# Patient Record
Sex: Female | Born: 1950 | Race: Black or African American | Hispanic: No | Marital: Married | Smoking: Former smoker
Health system: Southern US, Community
[De-identification: ages and names within clinical notes are randomized; demographics above are authoritative.]

## PROBLEM LIST (undated history)

## (undated) DIAGNOSIS — R7303 Prediabetes: Secondary | ICD-10-CM

## (undated) DIAGNOSIS — E785 Hyperlipidemia, unspecified: Secondary | ICD-10-CM

## (undated) DIAGNOSIS — Z8669 Personal history of other diseases of the nervous system and sense organs: Secondary | ICD-10-CM

## (undated) DIAGNOSIS — I071 Rheumatic tricuspid insufficiency: Secondary | ICD-10-CM

## (undated) DIAGNOSIS — M2550 Pain in unspecified joint: Secondary | ICD-10-CM

## (undated) DIAGNOSIS — M2540 Effusion, unspecified joint: Secondary | ICD-10-CM

## (undated) DIAGNOSIS — Z86718 Personal history of other venous thrombosis and embolism: Secondary | ICD-10-CM

## (undated) DIAGNOSIS — K5790 Diverticulosis of intestine, part unspecified, without perforation or abscess without bleeding: Secondary | ICD-10-CM

## (undated) DIAGNOSIS — Z8619 Personal history of other infectious and parasitic diseases: Secondary | ICD-10-CM

## (undated) DIAGNOSIS — G500 Trigeminal neuralgia: Secondary | ICD-10-CM

## (undated) DIAGNOSIS — R011 Cardiac murmur, unspecified: Secondary | ICD-10-CM

## (undated) DIAGNOSIS — G4700 Insomnia, unspecified: Secondary | ICD-10-CM

## (undated) DIAGNOSIS — H269 Unspecified cataract: Secondary | ICD-10-CM

## (undated) DIAGNOSIS — R51 Headache: Secondary | ICD-10-CM

## (undated) DIAGNOSIS — Z889 Allergy status to unspecified drugs, medicaments and biological substances status: Secondary | ICD-10-CM

## (undated) DIAGNOSIS — I10 Essential (primary) hypertension: Secondary | ICD-10-CM

## (undated) DIAGNOSIS — F419 Anxiety disorder, unspecified: Secondary | ICD-10-CM

## (undated) DIAGNOSIS — M1990 Unspecified osteoarthritis, unspecified site: Secondary | ICD-10-CM

## (undated) DIAGNOSIS — Z87898 Personal history of other specified conditions: Secondary | ICD-10-CM

## (undated) HISTORY — DX: Rheumatic tricuspid insufficiency: I07.1

## (undated) HISTORY — PX: ABDOMINAL HYSTERECTOMY: SHX81

## (undated) HISTORY — DX: Unspecified cataract: H26.9

## (undated) HISTORY — PX: COLONOSCOPY: SHX174

## (undated) HISTORY — PX: ESOPHAGOGASTRODUODENOSCOPY: SHX1529

## (undated) HISTORY — PX: JOINT REPLACEMENT: SHX530

## (undated) HISTORY — PX: REDUCTION MAMMAPLASTY: SUR839

## (undated) HISTORY — PX: EYE SURGERY: SHX253

## (undated) HISTORY — PX: CHOLECYSTECTOMY: SHX55

## (undated) HISTORY — DX: Trigeminal neuralgia: G50.0

---

## 1985-10-22 HISTORY — PX: BREAST REDUCTION SURGERY: SHX8

## 1986-10-22 HISTORY — PX: REDUCTION MAMMAPLASTY: SUR839

## 1998-05-17 ENCOUNTER — Ambulatory Visit (HOSPITAL_COMMUNITY): Admission: RE | Admit: 1998-05-17 | Discharge: 1998-05-17 | Payer: Self-pay | Admitting: General Surgery

## 1999-08-14 ENCOUNTER — Ambulatory Visit (HOSPITAL_COMMUNITY): Admission: RE | Admit: 1999-08-14 | Discharge: 1999-08-14 | Payer: Self-pay | Admitting: General Surgery

## 1999-08-14 ENCOUNTER — Encounter (HOSPITAL_BASED_OUTPATIENT_CLINIC_OR_DEPARTMENT_OTHER): Payer: Self-pay | Admitting: General Surgery

## 1999-12-11 ENCOUNTER — Emergency Department (HOSPITAL_COMMUNITY): Admission: EM | Admit: 1999-12-11 | Discharge: 1999-12-11 | Payer: Self-pay | Admitting: Emergency Medicine

## 2000-07-25 ENCOUNTER — Encounter (HOSPITAL_BASED_OUTPATIENT_CLINIC_OR_DEPARTMENT_OTHER): Payer: Self-pay | Admitting: General Surgery

## 2000-07-25 ENCOUNTER — Encounter: Admission: RE | Admit: 2000-07-25 | Discharge: 2000-07-25 | Payer: Self-pay | Admitting: General Surgery

## 2000-11-09 ENCOUNTER — Encounter: Payer: Self-pay | Admitting: Internal Medicine

## 2000-11-09 ENCOUNTER — Ambulatory Visit (HOSPITAL_COMMUNITY): Admission: RE | Admit: 2000-11-09 | Discharge: 2000-11-09 | Payer: Self-pay | Admitting: Internal Medicine

## 2001-01-28 ENCOUNTER — Emergency Department (HOSPITAL_COMMUNITY): Admission: EM | Admit: 2001-01-28 | Discharge: 2001-01-28 | Payer: Self-pay | Admitting: Emergency Medicine

## 2001-02-28 ENCOUNTER — Emergency Department (HOSPITAL_COMMUNITY): Admission: EM | Admit: 2001-02-28 | Discharge: 2001-02-28 | Payer: Self-pay | Admitting: Internal Medicine

## 2001-05-07 ENCOUNTER — Emergency Department (HOSPITAL_COMMUNITY): Admission: EM | Admit: 2001-05-07 | Discharge: 2001-05-07 | Payer: Self-pay | Admitting: Emergency Medicine

## 2001-05-07 ENCOUNTER — Encounter: Payer: Self-pay | Admitting: Emergency Medicine

## 2001-08-14 ENCOUNTER — Encounter (HOSPITAL_BASED_OUTPATIENT_CLINIC_OR_DEPARTMENT_OTHER): Payer: Self-pay | Admitting: General Surgery

## 2001-08-14 ENCOUNTER — Encounter: Admission: RE | Admit: 2001-08-14 | Discharge: 2001-08-14 | Payer: Self-pay | Admitting: General Surgery

## 2002-06-05 ENCOUNTER — Inpatient Hospital Stay (HOSPITAL_COMMUNITY): Admission: EM | Admit: 2002-06-05 | Discharge: 2002-06-08 | Payer: Self-pay | Admitting: Emergency Medicine

## 2002-06-05 ENCOUNTER — Encounter: Payer: Self-pay | Admitting: Emergency Medicine

## 2002-09-30 ENCOUNTER — Encounter (INDEPENDENT_AMBULATORY_CARE_PROVIDER_SITE_OTHER): Payer: Self-pay | Admitting: Specialist

## 2002-09-30 ENCOUNTER — Ambulatory Visit (HOSPITAL_COMMUNITY): Admission: RE | Admit: 2002-09-30 | Discharge: 2002-09-30 | Payer: Self-pay | Admitting: Gastroenterology

## 2002-10-02 ENCOUNTER — Encounter (HOSPITAL_BASED_OUTPATIENT_CLINIC_OR_DEPARTMENT_OTHER): Payer: Self-pay | Admitting: General Surgery

## 2002-10-02 ENCOUNTER — Encounter: Admission: RE | Admit: 2002-10-02 | Discharge: 2002-10-02 | Payer: Self-pay | Admitting: General Surgery

## 2002-11-01 ENCOUNTER — Encounter: Payer: Self-pay | Admitting: Emergency Medicine

## 2002-11-01 ENCOUNTER — Emergency Department (HOSPITAL_COMMUNITY): Admission: EM | Admit: 2002-11-01 | Discharge: 2002-11-01 | Payer: Self-pay | Admitting: Emergency Medicine

## 2002-11-06 ENCOUNTER — Emergency Department (HOSPITAL_COMMUNITY): Admission: EM | Admit: 2002-11-06 | Discharge: 2002-11-06 | Payer: Self-pay | Admitting: Emergency Medicine

## 2002-11-06 ENCOUNTER — Encounter: Payer: Self-pay | Admitting: Emergency Medicine

## 2003-06-15 ENCOUNTER — Emergency Department (HOSPITAL_COMMUNITY): Admission: EM | Admit: 2003-06-15 | Discharge: 2003-06-15 | Payer: Self-pay | Admitting: Emergency Medicine

## 2003-06-15 ENCOUNTER — Encounter: Payer: Self-pay | Admitting: Emergency Medicine

## 2003-06-21 ENCOUNTER — Encounter: Payer: Self-pay | Admitting: Emergency Medicine

## 2003-06-21 ENCOUNTER — Emergency Department (HOSPITAL_COMMUNITY): Admission: EM | Admit: 2003-06-21 | Discharge: 2003-06-21 | Payer: Self-pay | Admitting: Emergency Medicine

## 2003-06-25 ENCOUNTER — Encounter: Payer: Self-pay | Admitting: Gastroenterology

## 2003-06-25 ENCOUNTER — Ambulatory Visit (HOSPITAL_COMMUNITY): Admission: RE | Admit: 2003-06-25 | Discharge: 2003-06-25 | Payer: Self-pay | Admitting: Gastroenterology

## 2003-11-24 ENCOUNTER — Encounter: Admission: RE | Admit: 2003-11-24 | Discharge: 2003-11-24 | Payer: Self-pay | Admitting: Internal Medicine

## 2003-12-24 ENCOUNTER — Encounter: Admission: RE | Admit: 2003-12-24 | Discharge: 2003-12-24 | Payer: Self-pay | Admitting: General Surgery

## 2004-03-21 ENCOUNTER — Encounter: Admission: RE | Admit: 2004-03-21 | Discharge: 2004-03-21 | Payer: Self-pay | Admitting: Gastroenterology

## 2005-03-15 ENCOUNTER — Encounter: Admission: RE | Admit: 2005-03-15 | Discharge: 2005-03-15 | Payer: Self-pay | Admitting: Internal Medicine

## 2005-06-22 ENCOUNTER — Emergency Department (HOSPITAL_COMMUNITY): Admission: EM | Admit: 2005-06-22 | Discharge: 2005-06-22 | Payer: Self-pay | Admitting: Emergency Medicine

## 2005-10-28 ENCOUNTER — Emergency Department (HOSPITAL_COMMUNITY): Admission: EM | Admit: 2005-10-28 | Discharge: 2005-10-28 | Payer: Self-pay | Admitting: Emergency Medicine

## 2005-11-08 ENCOUNTER — Inpatient Hospital Stay (HOSPITAL_COMMUNITY): Admission: RE | Admit: 2005-11-08 | Discharge: 2005-11-12 | Payer: Self-pay | Admitting: Orthopedic Surgery

## 2006-04-17 ENCOUNTER — Encounter: Admission: RE | Admit: 2006-04-17 | Discharge: 2006-04-17 | Payer: Self-pay | Admitting: Internal Medicine

## 2006-04-22 ENCOUNTER — Emergency Department (HOSPITAL_COMMUNITY): Admission: EM | Admit: 2006-04-22 | Discharge: 2006-04-22 | Payer: Self-pay | Admitting: Emergency Medicine

## 2006-05-03 ENCOUNTER — Encounter: Admission: RE | Admit: 2006-05-03 | Discharge: 2006-05-03 | Payer: Self-pay | Admitting: Internal Medicine

## 2006-05-03 ENCOUNTER — Encounter (INDEPENDENT_AMBULATORY_CARE_PROVIDER_SITE_OTHER): Payer: Self-pay | Admitting: Specialist

## 2006-05-03 HISTORY — PX: BREAST BIOPSY: SHX20

## 2006-07-04 ENCOUNTER — Observation Stay (HOSPITAL_COMMUNITY): Admission: AD | Admit: 2006-07-04 | Discharge: 2006-07-07 | Payer: Self-pay | Admitting: Gastroenterology

## 2006-07-18 ENCOUNTER — Encounter: Admission: RE | Admit: 2006-07-18 | Discharge: 2006-07-18 | Payer: Self-pay | Admitting: Gastroenterology

## 2006-07-19 ENCOUNTER — Emergency Department (HOSPITAL_COMMUNITY): Admission: EM | Admit: 2006-07-19 | Discharge: 2006-07-19 | Payer: Self-pay | Admitting: Emergency Medicine

## 2006-07-25 ENCOUNTER — Ambulatory Visit: Payer: Self-pay | Admitting: Gastroenterology

## 2006-09-02 ENCOUNTER — Inpatient Hospital Stay (HOSPITAL_COMMUNITY): Admission: EM | Admit: 2006-09-02 | Discharge: 2006-09-05 | Payer: Self-pay | Admitting: Emergency Medicine

## 2006-12-03 ENCOUNTER — Encounter: Admission: RE | Admit: 2006-12-03 | Discharge: 2006-12-03 | Payer: Self-pay | Admitting: Internal Medicine

## 2007-04-28 ENCOUNTER — Encounter: Admission: RE | Admit: 2007-04-28 | Discharge: 2007-04-28 | Payer: Self-pay | Admitting: Internal Medicine

## 2007-05-22 ENCOUNTER — Emergency Department (HOSPITAL_COMMUNITY): Admission: EM | Admit: 2007-05-22 | Discharge: 2007-05-22 | Payer: Self-pay | Admitting: Emergency Medicine

## 2007-06-20 ENCOUNTER — Encounter: Admission: RE | Admit: 2007-06-20 | Discharge: 2007-06-20 | Payer: Self-pay | Admitting: Gastroenterology

## 2007-06-21 ENCOUNTER — Inpatient Hospital Stay (HOSPITAL_COMMUNITY): Admission: EM | Admit: 2007-06-21 | Discharge: 2007-06-23 | Payer: Self-pay | Admitting: Gastroenterology

## 2007-07-24 ENCOUNTER — Emergency Department (HOSPITAL_COMMUNITY): Admission: EM | Admit: 2007-07-24 | Discharge: 2007-07-24 | Payer: Self-pay | Admitting: *Deleted

## 2007-08-01 ENCOUNTER — Ambulatory Visit (HOSPITAL_COMMUNITY): Admission: RE | Admit: 2007-08-01 | Discharge: 2007-08-01 | Payer: Self-pay | Admitting: Gastroenterology

## 2007-10-20 ENCOUNTER — Emergency Department (HOSPITAL_COMMUNITY): Admission: EM | Admit: 2007-10-20 | Discharge: 2007-10-20 | Payer: Self-pay | Admitting: Emergency Medicine

## 2007-12-27 ENCOUNTER — Emergency Department (HOSPITAL_COMMUNITY): Admission: EM | Admit: 2007-12-27 | Discharge: 2007-12-27 | Payer: Self-pay | Admitting: Emergency Medicine

## 2008-05-13 ENCOUNTER — Encounter: Admission: RE | Admit: 2008-05-13 | Discharge: 2008-05-13 | Payer: Self-pay | Admitting: Internal Medicine

## 2008-08-12 ENCOUNTER — Other Ambulatory Visit: Payer: Self-pay | Admitting: Internal Medicine

## 2008-08-12 ENCOUNTER — Inpatient Hospital Stay (HOSPITAL_COMMUNITY): Admission: EM | Admit: 2008-08-12 | Discharge: 2008-08-14 | Payer: Self-pay | Admitting: Internal Medicine

## 2008-08-12 ENCOUNTER — Other Ambulatory Visit: Payer: Self-pay | Admitting: Emergency Medicine

## 2008-08-13 ENCOUNTER — Encounter (INDEPENDENT_AMBULATORY_CARE_PROVIDER_SITE_OTHER): Payer: Self-pay | Admitting: Internal Medicine

## 2008-08-13 ENCOUNTER — Ambulatory Visit: Payer: Self-pay | Admitting: Vascular Surgery

## 2008-08-24 ENCOUNTER — Observation Stay (HOSPITAL_COMMUNITY): Admission: EM | Admit: 2008-08-24 | Discharge: 2008-08-25 | Payer: Self-pay | Admitting: Emergency Medicine

## 2008-09-14 ENCOUNTER — Ambulatory Visit (HOSPITAL_BASED_OUTPATIENT_CLINIC_OR_DEPARTMENT_OTHER): Admission: RE | Admit: 2008-09-14 | Discharge: 2008-09-14 | Payer: Self-pay | Admitting: Family Medicine

## 2008-09-23 ENCOUNTER — Emergency Department (HOSPITAL_BASED_OUTPATIENT_CLINIC_OR_DEPARTMENT_OTHER): Admission: EM | Admit: 2008-09-23 | Discharge: 2008-09-23 | Payer: Self-pay | Admitting: Emergency Medicine

## 2008-09-23 ENCOUNTER — Ambulatory Visit (HOSPITAL_COMMUNITY): Admission: RE | Admit: 2008-09-23 | Discharge: 2008-09-23 | Payer: Self-pay | Admitting: Emergency Medicine

## 2008-10-22 DIAGNOSIS — Z87898 Personal history of other specified conditions: Secondary | ICD-10-CM

## 2008-10-22 HISTORY — DX: Personal history of other specified conditions: Z87.898

## 2008-10-27 ENCOUNTER — Ambulatory Visit: Payer: Self-pay | Admitting: Diagnostic Radiology

## 2008-10-27 ENCOUNTER — Encounter: Payer: Self-pay | Admitting: Emergency Medicine

## 2008-10-27 ENCOUNTER — Observation Stay (HOSPITAL_COMMUNITY): Admission: EM | Admit: 2008-10-27 | Discharge: 2008-10-30 | Payer: Self-pay | Admitting: Internal Medicine

## 2008-11-04 HISTORY — PX: NM MYOCAR PERF WALL MOTION: HXRAD629

## 2008-11-10 ENCOUNTER — Ambulatory Visit: Payer: Self-pay | Admitting: Oncology

## 2009-02-10 ENCOUNTER — Emergency Department (HOSPITAL_COMMUNITY): Admission: EM | Admit: 2009-02-10 | Discharge: 2009-02-11 | Payer: Self-pay | Admitting: Emergency Medicine

## 2009-02-16 ENCOUNTER — Inpatient Hospital Stay (HOSPITAL_COMMUNITY): Admission: RE | Admit: 2009-02-16 | Discharge: 2009-02-21 | Payer: Self-pay | Admitting: Orthopedic Surgery

## 2009-05-04 ENCOUNTER — Inpatient Hospital Stay (HOSPITAL_COMMUNITY): Admission: EM | Admit: 2009-05-04 | Discharge: 2009-05-10 | Payer: Self-pay | Admitting: Emergency Medicine

## 2009-05-27 ENCOUNTER — Encounter: Admission: RE | Admit: 2009-05-27 | Discharge: 2009-05-27 | Payer: Self-pay | Admitting: Internal Medicine

## 2009-07-25 ENCOUNTER — Emergency Department (HOSPITAL_BASED_OUTPATIENT_CLINIC_OR_DEPARTMENT_OTHER): Admission: EM | Admit: 2009-07-25 | Discharge: 2009-07-25 | Payer: Self-pay | Admitting: Emergency Medicine

## 2009-07-25 ENCOUNTER — Ambulatory Visit: Payer: Self-pay | Admitting: Diagnostic Radiology

## 2009-08-16 ENCOUNTER — Ambulatory Visit (HOSPITAL_COMMUNITY): Admission: RE | Admit: 2009-08-16 | Discharge: 2009-08-16 | Payer: Self-pay | Admitting: Orthopedic Surgery

## 2009-08-26 ENCOUNTER — Ambulatory Visit: Payer: Self-pay | Admitting: Obstetrics & Gynecology

## 2009-10-20 ENCOUNTER — Inpatient Hospital Stay (HOSPITAL_COMMUNITY): Admission: EM | Admit: 2009-10-20 | Discharge: 2009-10-22 | Payer: Self-pay | Admitting: Emergency Medicine

## 2010-03-18 ENCOUNTER — Ambulatory Visit: Payer: Self-pay | Admitting: Diagnostic Radiology

## 2010-03-18 ENCOUNTER — Emergency Department (HOSPITAL_BASED_OUTPATIENT_CLINIC_OR_DEPARTMENT_OTHER): Admission: EM | Admit: 2010-03-18 | Discharge: 2010-03-18 | Payer: Self-pay | Admitting: Emergency Medicine

## 2010-05-09 ENCOUNTER — Emergency Department (HOSPITAL_COMMUNITY): Admission: EM | Admit: 2010-05-09 | Discharge: 2010-05-09 | Payer: Self-pay | Admitting: Emergency Medicine

## 2010-06-07 ENCOUNTER — Encounter: Admission: RE | Admit: 2010-06-07 | Discharge: 2010-06-07 | Payer: Self-pay | Admitting: Internal Medicine

## 2010-07-26 ENCOUNTER — Encounter: Admission: RE | Admit: 2010-07-26 | Discharge: 2010-07-26 | Payer: Self-pay | Admitting: Orthopedic Surgery

## 2010-08-10 ENCOUNTER — Emergency Department (HOSPITAL_COMMUNITY): Admission: EM | Admit: 2010-08-10 | Discharge: 2010-08-10 | Payer: Self-pay | Admitting: Emergency Medicine

## 2010-10-26 ENCOUNTER — Emergency Department (HOSPITAL_COMMUNITY)
Admission: EM | Admit: 2010-10-26 | Discharge: 2010-10-26 | Payer: Self-pay | Source: Home / Self Care | Admitting: Emergency Medicine

## 2010-10-26 LAB — DIFFERENTIAL
Basophils Absolute: 0 10*3/uL (ref 0.0–0.1)
Basophils Relative: 1 % (ref 0–1)
Eosinophils Absolute: 0.1 10*3/uL (ref 0.0–0.7)
Eosinophils Relative: 1 % (ref 0–5)
Lymphocytes Relative: 40 % (ref 12–46)
Lymphs Abs: 2.1 10*3/uL (ref 0.7–4.0)
Monocytes Absolute: 0.5 10*3/uL (ref 0.1–1.0)
Monocytes Relative: 10 % (ref 3–12)
Neutro Abs: 2.5 10*3/uL (ref 1.7–7.7)
Neutrophils Relative %: 49 % (ref 43–77)

## 2010-10-26 LAB — BASIC METABOLIC PANEL
BUN: 16 mg/dL (ref 6–23)
CO2: 26 mEq/L (ref 19–32)
Calcium: 9.5 mg/dL (ref 8.4–10.5)
Chloride: 105 mEq/L (ref 96–112)
Creatinine, Ser: 0.76 mg/dL (ref 0.4–1.2)
GFR calc Af Amer: >60 mL/min (ref >60)
GFR calc non Af Amer: >60 mL/min (ref >60)
Glucose, Bld: 93 mg/dL (ref 70–99)
Potassium: 3.9 mEq/L (ref 3.5–5.1)
Sodium: 139 mEq/L (ref 135–145)

## 2010-10-26 LAB — URINALYSIS, ROUTINE W REFLEX MICROSCOPIC
Bilirubin Urine: NEGATIVE
Hemoglobin, Urine: NEGATIVE
Ketones, ur: NEGATIVE mg/dL
Nitrite: NEGATIVE
Protein, ur: NEGATIVE mg/dL
Specific Gravity, Urine: 1.022 (ref 1.005–1.030)
Urine Glucose, Fasting: NEGATIVE mg/dL
Urobilinogen, UA: 0.2 mg/dL (ref 0.0–1.0)
pH: 5.5 (ref 5.0–8.0)

## 2010-10-26 LAB — CBC
HCT: 42.3 % (ref 36.0–46.0)
Hemoglobin: 13.7 g/dL (ref 12.0–15.0)
MCH: 28.1 pg (ref 26.0–34.0)
MCHC: 32.4 g/dL (ref 30.0–36.0)
MCV: 86.7 fL (ref 78.0–100.0)
Platelets: 223 10*3/uL (ref 150–400)
RBC: 4.88 MIL/uL (ref 3.87–5.11)
RDW: 14.9 % (ref 11.5–15.5)
WBC: 5.2 10*3/uL (ref 4.0–10.5)

## 2010-10-26 LAB — POCT PREGNANCY, URINE: Preg Test, Ur: NEGATIVE

## 2010-10-26 LAB — PROTIME-INR
INR: 0.95 (ref 0.00–1.49)
Prothrombin Time: 12.9 seconds (ref 11.6–15.2)

## 2010-11-13 ENCOUNTER — Encounter: Payer: Self-pay | Admitting: Internal Medicine

## 2011-01-02 ENCOUNTER — Other Ambulatory Visit: Payer: Self-pay | Admitting: Internal Medicine

## 2011-01-02 DIAGNOSIS — N644 Mastodynia: Secondary | ICD-10-CM

## 2011-01-03 LAB — POCT CARDIAC MARKERS
CKMB, poc: <1 ng/mL — ABNORMAL LOW (ref 1.0–8.0)
Troponin i, poc: <0.05 ng/mL (ref 0.00–0.09)

## 2011-01-03 LAB — DIFFERENTIAL
Basophils Absolute: 0 10*3/uL (ref 0.0–0.1)
Eosinophils Relative: 1 % (ref 0–5)
Lymphocytes Relative: 40 % (ref 12–46)
Lymphs Abs: 2.7 10*3/uL (ref 0.7–4.0)
Neutro Abs: 3.5 10*3/uL (ref 1.7–7.7)
Neutrophils Relative %: 51 % (ref 43–77)

## 2011-01-03 LAB — URINE MICROSCOPIC-ADD ON

## 2011-01-03 LAB — D-DIMER, QUANTITATIVE: D-Dimer, Quant: 0.37 ug/mL-FEU (ref 0.00–0.48)

## 2011-01-03 LAB — URINALYSIS, ROUTINE W REFLEX MICROSCOPIC
Nitrite: NEGATIVE
Specific Gravity, Urine: 1.026 (ref 1.005–1.030)
Urobilinogen, UA: 0.2 mg/dL (ref 0.0–1.0)
pH: 5.5 (ref 5.0–8.0)

## 2011-01-03 LAB — BASIC METABOLIC PANEL
BUN: 14 mg/dL (ref 6–23)
Chloride: 103 mEq/L (ref 96–112)
Creatinine, Ser: 0.84 mg/dL (ref 0.4–1.2)
GFR calc Af Amer: >60 mL/min (ref >60)
GFR calc non Af Amer: >60 mL/min (ref >60)

## 2011-01-03 LAB — PROTIME-INR
INR: 0.97 (ref 0.00–1.49)
Prothrombin Time: 13.1 seconds (ref 11.6–15.2)

## 2011-01-03 LAB — CBC
MCV: 86.5 fL (ref 78.0–100.0)
Platelets: 243 10*3/uL (ref 150–400)
RBC: 4.46 MIL/uL (ref 3.87–5.11)
RDW: 15.1 % (ref 11.5–15.5)
WBC: 6.8 10*3/uL (ref 4.0–10.5)

## 2011-01-03 LAB — RAPID URINE DRUG SCREEN, HOSP PERFORMED
Benzodiazepines: POSITIVE — AB
Tetrahydrocannabinol: NOT DETECTED

## 2011-01-03 LAB — APTT: aPTT: 25 seconds (ref 24–37)

## 2011-01-03 LAB — ETHANOL: Alcohol, Ethyl (B): <5 mg/dL (ref 0–10)

## 2011-01-09 ENCOUNTER — Ambulatory Visit
Admission: RE | Admit: 2011-01-09 | Discharge: 2011-01-09 | Disposition: A | Discharge status: Home / Self Care | Source: Ambulatory Visit | Attending: Internal Medicine | Admitting: Internal Medicine

## 2011-01-09 DIAGNOSIS — N644 Mastodynia: Secondary | ICD-10-CM

## 2011-01-22 LAB — BASIC METABOLIC PANEL
BUN: 17 mg/dL (ref 6–23)
Chloride: 105 mEq/L (ref 96–112)
GFR calc non Af Amer: >60 mL/min (ref >60)
Glucose, Bld: 88 mg/dL (ref 70–99)
Potassium: 3.4 mEq/L — ABNORMAL LOW (ref 3.5–5.1)
Sodium: 139 mEq/L (ref 135–145)

## 2011-01-22 LAB — PROTIME-INR: Prothrombin Time: 13.2 seconds (ref 11.6–15.2)

## 2011-01-22 LAB — APTT: aPTT: 25 seconds (ref 24–37)

## 2011-01-22 LAB — CBC
HCT: 40.6 % (ref 36.0–46.0)
Hemoglobin: 13.4 g/dL (ref 12.0–15.0)
RBC: 4.61 MIL/uL (ref 3.87–5.11)
RDW: 15.7 % — ABNORMAL HIGH (ref 11.5–15.5)
WBC: 6.2 10*3/uL (ref 4.0–10.5)

## 2011-01-22 LAB — CK TOTAL AND CKMB (NOT AT ARMC)
CK, MB: 1 ng/mL (ref 0.3–4.0)
CK, MB: 1.3 ng/mL (ref 0.3–4.0)
Total CK: 61 U/L (ref 7–177)
Total CK: 82 U/L (ref 7–177)

## 2011-01-22 LAB — LIPID PANEL
Cholesterol: 148 mg/dL (ref 0–200)
LDL Cholesterol: 50 mg/dL (ref 0–99)

## 2011-01-22 LAB — TROPONIN I: Troponin I: 0.02 ng/mL (ref 0.00–0.06)

## 2011-01-25 LAB — URINE CULTURE

## 2011-01-25 LAB — URINALYSIS, ROUTINE W REFLEX MICROSCOPIC
Nitrite: NEGATIVE
Protein, ur: NEGATIVE mg/dL
Specific Gravity, Urine: 1.019 (ref 1.005–1.030)
Urobilinogen, UA: 0.2 mg/dL (ref 0.0–1.0)

## 2011-01-28 LAB — CBC
HCT: 31.7 % — ABNORMAL LOW (ref 36.0–46.0)
Hemoglobin: 10.2 g/dL — ABNORMAL LOW (ref 12.0–15.0)
MCHC: 32.2 g/dL (ref 30.0–36.0)
MCV: 85.6 fL (ref 78.0–100.0)
RDW: 16.2 % — ABNORMAL HIGH (ref 11.5–15.5)

## 2011-01-29 ENCOUNTER — Emergency Department (HOSPITAL_COMMUNITY)
Admission: EM | Admit: 2011-01-29 | Discharge: 2011-01-29 | Disposition: A | Discharge status: Home / Self Care | Attending: Emergency Medicine | Admitting: Emergency Medicine

## 2011-01-29 ENCOUNTER — Emergency Department (HOSPITAL_COMMUNITY)

## 2011-01-29 DIAGNOSIS — K5289 Other specified noninfective gastroenteritis and colitis: Secondary | ICD-10-CM | POA: Insufficient documentation

## 2011-01-29 DIAGNOSIS — K5730 Diverticulosis of large intestine without perforation or abscess without bleeding: Secondary | ICD-10-CM | POA: Insufficient documentation

## 2011-01-29 DIAGNOSIS — I10 Essential (primary) hypertension: Secondary | ICD-10-CM | POA: Insufficient documentation

## 2011-01-29 DIAGNOSIS — R109 Unspecified abdominal pain: Secondary | ICD-10-CM | POA: Insufficient documentation

## 2011-01-29 LAB — DIFFERENTIAL
Basophils Absolute: 0 10*3/uL (ref 0.0–0.1)
Basophils Relative: 0 % (ref 0–1)
Basophils Relative: 0 % (ref 0–1)
Basophils Relative: 0 % (ref 0–1)
Eosinophils Absolute: 0 10*3/uL (ref 0.0–0.7)
Eosinophils Absolute: 0.1 10*3/uL (ref 0.0–0.7)
Eosinophils Relative: 1 % (ref 0–5)
Lymphocytes Relative: 38 % (ref 12–46)
Lymphs Abs: 2.3 10*3/uL (ref 0.7–4.0)
Lymphs Abs: 2.5 10*3/uL (ref 0.7–4.0)
Lymphs Abs: 2.5 10*3/uL (ref 0.7–4.0)
Monocytes Absolute: 0.6 10*3/uL (ref 0.1–1.0)
Monocytes Relative: 6 % (ref 3–12)
Monocytes Relative: 7 % (ref 3–12)
Monocytes Relative: 8 % (ref 3–12)
Monocytes Relative: 9 % (ref 3–12)
Neutro Abs: 3.5 10*3/uL (ref 1.7–7.7)
Neutro Abs: 5.1 10*3/uL (ref 1.7–7.7)
Neutrophils Relative %: 53 % (ref 43–77)
Neutrophils Relative %: 63 % (ref 43–77)
Neutrophils Relative %: 70 % (ref 43–77)

## 2011-01-29 LAB — BASIC METABOLIC PANEL
Calcium: 8.7 mg/dL (ref 8.4–10.5)
Creatinine, Ser: 0.93 mg/dL (ref 0.4–1.2)
GFR calc Af Amer: >60 mL/min (ref >60)

## 2011-01-29 LAB — CBC
HCT: 41 % (ref 36.0–46.0)
Hemoglobin: 10.6 g/dL — ABNORMAL LOW (ref 12.0–15.0)
Hemoglobin: 13.2 g/dL (ref 12.0–15.0)
MCH: 28 pg (ref 26.0–34.0)
MCHC: 32.8 g/dL (ref 30.0–36.0)
MCHC: 32.8 g/dL (ref 30.0–36.0)
MCHC: 33.2 g/dL (ref 30.0–36.0)
MCV: 83.4 fL (ref 78.0–100.0)
Platelets: 209 10*3/uL (ref 150–400)
Platelets: 275 10*3/uL (ref 150–400)
RBC: 4.13 MIL/uL (ref 3.87–5.11)
RBC: 4.71 MIL/uL (ref 3.87–5.11)
RBC: 4.83 MIL/uL (ref 3.87–5.11)
RBC: 5.13 MIL/uL — ABNORMAL HIGH (ref 3.87–5.11)
RDW: 17.2 % — ABNORMAL HIGH (ref 11.5–15.5)
RDW: 17.6 % — ABNORMAL HIGH (ref 11.5–15.5)
WBC: 8.1 10*3/uL (ref 4.0–10.5)

## 2011-01-29 LAB — URINALYSIS, ROUTINE W REFLEX MICROSCOPIC
Bilirubin Urine: NEGATIVE
Glucose, UA: NEGATIVE mg/dL
Hgb urine dipstick: NEGATIVE
Specific Gravity, Urine: 1.026 (ref 1.005–1.030)
Urobilinogen, UA: 0.2 mg/dL (ref 0.0–1.0)
pH: 5.5 (ref 5.0–8.0)

## 2011-01-29 LAB — URINALYSIS, MICROSCOPIC ONLY
Glucose, UA: NEGATIVE mg/dL
Nitrite: NEGATIVE
Protein, ur: NEGATIVE mg/dL

## 2011-01-29 LAB — HEPATIC FUNCTION PANEL
Alkaline Phosphatase: 67 U/L (ref 39–117)
Indirect Bilirubin: 0.4 mg/dL (ref 0.3–0.9)
Total Bilirubin: 0.5 mg/dL (ref 0.3–1.2)

## 2011-01-29 LAB — URINE CULTURE
Culture: NO GROWTH
Special Requests: NEGATIVE

## 2011-01-29 LAB — COMPREHENSIVE METABOLIC PANEL
ALT: 10 U/L (ref 0–35)
ALT: 12 U/L (ref 0–35)
ALT: 16 U/L (ref 0–35)
AST: 20 U/L (ref 0–37)
AST: 23 U/L (ref 0–37)
Albumin: 2.8 g/dL — ABNORMAL LOW (ref 3.5–5.2)
Alkaline Phosphatase: 59 U/L (ref 39–117)
CO2: 26 mEq/L (ref 19–32)
CO2: 28 mEq/L (ref 19–32)
Calcium: 8.5 mg/dL (ref 8.4–10.5)
Calcium: 8.9 mg/dL (ref 8.4–10.5)
Chloride: 103 mEq/L (ref 96–112)
Creatinine, Ser: 0.83 mg/dL (ref 0.4–1.2)
GFR calc Af Amer: >60 mL/min (ref >60)
GFR calc Af Amer: >60 mL/min (ref >60)
GFR calc Af Amer: >60 mL/min (ref >60)
GFR calc non Af Amer: >60 mL/min (ref >60)
Glucose, Bld: 95 mg/dL (ref 70–99)
Potassium: 4 mEq/L (ref 3.5–5.1)
Sodium: 141 mEq/L (ref 135–145)
Sodium: 142 mEq/L (ref 135–145)
Total Bilirubin: 0.5 mg/dL (ref 0.3–1.2)
Total Protein: 5.3 g/dL — ABNORMAL LOW (ref 6.0–8.3)
Total Protein: 6.7 g/dL (ref 6.0–8.3)

## 2011-01-29 LAB — LIPASE, BLOOD
Lipase: 23 U/L (ref 11–59)
Lipase: 29 U/L (ref 11–59)

## 2011-01-29 LAB — CARDIAC PANEL(CRET KIN+CKTOT+MB+TROPI)
CK, MB: 0.7 ng/mL (ref 0.3–4.0)
Relative Index: INVALID (ref 0.0–2.5)
Total CK: 51 U/L (ref 7–177)
Troponin I: 0.01 ng/mL (ref 0.00–0.06)

## 2011-01-29 LAB — TSH: TSH: 3.679 u[IU]/mL (ref 0.350–4.500)

## 2011-01-29 MED ORDER — IOHEXOL 300 MG/ML  SOLN
100.0000 mL | Freq: Once | INTRAMUSCULAR | Status: AC | PRN
  Administered 2011-01-29: 100 mL via INTRAVENOUS

## 2011-01-30 LAB — BASIC METABOLIC PANEL
BUN: 5 mg/dL — ABNORMAL LOW (ref 6–23)
BUN: 6 mg/dL (ref 6–23)
CO2: 30 mEq/L (ref 19–32)
Calcium: 8.2 mg/dL — ABNORMAL LOW (ref 8.4–10.5)
Chloride: 102 mEq/L (ref 96–112)
Creatinine, Ser: 0.64 mg/dL (ref 0.4–1.2)
Creatinine, Ser: 0.7 mg/dL (ref 0.4–1.2)
GFR calc non Af Amer: >60 mL/min (ref >60)
Glucose, Bld: 109 mg/dL — ABNORMAL HIGH (ref 70–99)
Potassium: 3.8 mEq/L (ref 3.5–5.1)

## 2011-01-30 LAB — PROTIME-INR
INR: 1.5 (ref 0.00–1.49)
Prothrombin Time: 18.4 seconds — ABNORMAL HIGH (ref 11.6–15.2)
Prothrombin Time: 18.5 seconds — ABNORMAL HIGH (ref 11.6–15.2)

## 2011-01-30 LAB — CBC
MCHC: 33.2 g/dL (ref 30.0–36.0)
MCHC: 34.6 g/dL (ref 30.0–36.0)
MCV: 83.9 fL (ref 78.0–100.0)
Platelets: 172 10*3/uL (ref 150–400)
RBC: 2.9 MIL/uL — ABNORMAL LOW (ref 3.87–5.11)
RDW: 15.6 % — ABNORMAL HIGH (ref 11.5–15.5)
RDW: 16.4 % — ABNORMAL HIGH (ref 11.5–15.5)

## 2011-01-31 LAB — CBC
HCT: 38.6 % (ref 36.0–46.0)
Hemoglobin: 9.1 g/dL — ABNORMAL LOW (ref 12.0–15.0)
Hemoglobin: 12.6 g/dL (ref 12.0–15.0)
Platelets: 110 10*3/uL — ABNORMAL LOW (ref 150–400)
Platelets: 168 10*3/uL (ref 150–400)
RDW: 16.2 % — ABNORMAL HIGH (ref 11.5–15.5)
WBC: 11 10*3/uL — ABNORMAL HIGH (ref 4.0–10.5)
WBC: 11.8 10*3/uL — ABNORMAL HIGH (ref 4.0–10.5)
WBC: 6.4 10*3/uL (ref 4.0–10.5)

## 2011-01-31 LAB — TYPE AND SCREEN: Antibody Screen: NEGATIVE

## 2011-01-31 LAB — BASIC METABOLIC PANEL
BUN: 7 mg/dL (ref 6–23)
CO2: 27 mEq/L (ref 19–32)
Calcium: 8.6 mg/dL (ref 8.4–10.5)
Chloride: 107 mEq/L (ref 96–112)
Creatinine, Ser: 0.61 mg/dL (ref 0.4–1.2)
GFR calc non Af Amer: >60 mL/min (ref >60)
GFR calc non Af Amer: >60 mL/min (ref >60)
Glucose, Bld: 100 mg/dL — ABNORMAL HIGH (ref 70–99)
Potassium: 4.3 mEq/L (ref 3.5–5.1)
Potassium: 3.9 mEq/L (ref 3.5–5.1)
Sodium: 141 mEq/L (ref 135–145)

## 2011-01-31 LAB — URINALYSIS, ROUTINE W REFLEX MICROSCOPIC
Glucose, UA: NEGATIVE mg/dL
Hgb urine dipstick: NEGATIVE
Protein, ur: NEGATIVE mg/dL

## 2011-01-31 LAB — DIFFERENTIAL
Eosinophils Relative: 1 % (ref 0–5)
Lymphocytes Relative: 36 % (ref 12–46)
Lymphs Abs: 2.3 10*3/uL (ref 0.7–4.0)
Monocytes Absolute: 0.7 10*3/uL (ref 0.1–1.0)
Monocytes Relative: 11 % (ref 3–12)

## 2011-01-31 LAB — CK TOTAL AND CKMB (NOT AT ARMC)
CK, MB: 1.5 ng/mL (ref 0.3–4.0)
Relative Index: 0.9 (ref 0.0–2.5)
Total CK: 494 U/L — ABNORMAL HIGH (ref 7–177)

## 2011-01-31 LAB — POCT CARDIAC MARKERS
CKMB, poc: <1 ng/mL — ABNORMAL LOW (ref 1.0–8.0)
CKMB, poc: <1 ng/mL — ABNORMAL LOW (ref 1.0–8.0)
Myoglobin, poc: 42.5 ng/mL (ref 12–200)

## 2011-01-31 LAB — PROTIME-INR
INR: 1.1 (ref 0.00–1.49)
INR: 1.4 (ref 0.00–1.49)
Prothrombin Time: 13.1 seconds (ref 11.6–15.2)
Prothrombin Time: 14.8 seconds (ref 11.6–15.2)

## 2011-01-31 LAB — TROPONIN I
Troponin I: 0.06 ng/mL (ref 0.00–0.06)
Troponin I: <0.01 ng/mL (ref 0.00–0.06)

## 2011-01-31 LAB — ABO/RH: ABO/RH(D): O POS

## 2011-02-05 LAB — BASIC METABOLIC PANEL
BUN: 8 mg/dL (ref 6–23)
CO2: 25 mEq/L (ref 19–32)
Calcium: 9 mg/dL (ref 8.4–10.5)
GFR calc non Af Amer: >60 mL/min (ref >60)
GFR calc non Af Amer: >60 mL/min (ref >60)
Glucose, Bld: 97 mg/dL (ref 70–99)
Glucose, Bld: 117 mg/dL — ABNORMAL HIGH (ref 70–99)
Potassium: 3.9 mEq/L (ref 3.5–5.1)
Potassium: 4.3 mEq/L (ref 3.5–5.1)
Sodium: 140 mEq/L (ref 135–145)

## 2011-02-05 LAB — CBC
HCT: 40.9 % (ref 36.0–46.0)
HCT: 36.6 % (ref 36.0–46.0)
HCT: 36.9 % (ref 36.0–46.0)
Hemoglobin: 13.3 g/dL (ref 12.0–15.0)
MCHC: 32.9 g/dL (ref 30.0–36.0)
MCV: 86.1 fL (ref 78.0–100.0)
Platelets: 220 10*3/uL (ref 150–400)
Platelets: 225 10*3/uL (ref 150–400)
RDW: 14.2 % (ref 11.5–15.5)
RDW: 15.2 % (ref 11.5–15.5)
RDW: 15.4 % (ref 11.5–15.5)

## 2011-02-05 LAB — CARDIAC PANEL(CRET KIN+CKTOT+MB+TROPI)
CK, MB: 0.9 ng/mL (ref 0.3–4.0)
CK, MB: 1.1 ng/mL (ref 0.3–4.0)
Total CK: 100 U/L (ref 7–177)
Total CK: 84 U/L (ref 7–177)

## 2011-02-05 LAB — PROTIME-INR: Prothrombin Time: 20.8 seconds — ABNORMAL HIGH (ref 11.6–15.2)

## 2011-02-05 LAB — DIFFERENTIAL
Eosinophils Relative: 1 % (ref 0–5)
Lymphocytes Relative: 36 % (ref 12–46)
Lymphs Abs: 2.5 10*3/uL (ref 0.7–4.0)
Monocytes Absolute: 0.7 10*3/uL (ref 0.1–1.0)

## 2011-02-05 LAB — POCT CARDIAC MARKERS
CKMB, poc: 1 ng/mL (ref 1.0–8.0)
Myoglobin, poc: 32.4 ng/mL (ref 12–200)
Troponin i, poc: <0.05 ng/mL (ref 0.00–0.09)

## 2011-02-12 ENCOUNTER — Emergency Department (HOSPITAL_COMMUNITY)
Admission: EM | Admit: 2011-02-12 | Discharge: 2011-02-12 | Disposition: A | Discharge status: Home / Self Care | Attending: Emergency Medicine | Admitting: Emergency Medicine

## 2011-02-12 DIAGNOSIS — K2920 Alcoholic gastritis without bleeding: Secondary | ICD-10-CM | POA: Insufficient documentation

## 2011-02-12 DIAGNOSIS — E7800 Pure hypercholesterolemia, unspecified: Secondary | ICD-10-CM | POA: Insufficient documentation

## 2011-02-12 DIAGNOSIS — Z86711 Personal history of pulmonary embolism: Secondary | ICD-10-CM | POA: Insufficient documentation

## 2011-02-12 DIAGNOSIS — R1011 Right upper quadrant pain: Secondary | ICD-10-CM | POA: Insufficient documentation

## 2011-02-12 DIAGNOSIS — I10 Essential (primary) hypertension: Secondary | ICD-10-CM | POA: Insufficient documentation

## 2011-02-12 DIAGNOSIS — K219 Gastro-esophageal reflux disease without esophagitis: Secondary | ICD-10-CM | POA: Insufficient documentation

## 2011-02-12 LAB — COMPREHENSIVE METABOLIC PANEL
ALT: 28 U/L (ref 0–35)
AST: 32 U/L (ref 0–37)
Albumin: 4.3 g/dL (ref 3.5–5.2)
Chloride: 108 mEq/L (ref 96–112)
Creatinine, Ser: 0.83 mg/dL (ref 0.4–1.2)
GFR calc Af Amer: >60 mL/min (ref >60)
Potassium: 3.5 mEq/L (ref 3.5–5.1)
Sodium: 139 mEq/L (ref 135–145)
Total Bilirubin: 0.3 mg/dL (ref 0.3–1.2)

## 2011-02-12 LAB — URINALYSIS, ROUTINE W REFLEX MICROSCOPIC
Glucose, UA: NEGATIVE mg/dL
Leukocytes, UA: NEGATIVE
Nitrite: NEGATIVE
Specific Gravity, Urine: 1.025 (ref 1.005–1.030)
pH: 5 (ref 5.0–8.0)

## 2011-02-12 LAB — DIFFERENTIAL
Basophils Absolute: 0 10*3/uL (ref 0.0–0.1)
Basophils Relative: 0 % (ref 0–1)
Eosinophils Absolute: 0 10*3/uL (ref 0.0–0.7)
Neutro Abs: 4.9 10*3/uL (ref 1.7–7.7)
Neutrophils Relative %: 65 % (ref 43–77)

## 2011-02-12 LAB — URINE MICROSCOPIC-ADD ON

## 2011-02-12 LAB — CBC
Hemoglobin: 14.4 g/dL (ref 12.0–15.0)
MCH: 28.5 pg (ref 26.0–34.0)
Platelets: 233 10*3/uL (ref 150–400)
RBC: 5.06 MIL/uL (ref 3.87–5.11)
WBC: 7.5 10*3/uL (ref 4.0–10.5)

## 2011-03-01 ENCOUNTER — Other Ambulatory Visit: Payer: Self-pay | Admitting: Gastroenterology

## 2011-03-01 DIAGNOSIS — R110 Nausea: Secondary | ICD-10-CM

## 2011-03-02 ENCOUNTER — Other Ambulatory Visit: Payer: Self-pay | Admitting: Gastroenterology

## 2011-03-02 ENCOUNTER — Ambulatory Visit
Admission: RE | Admit: 2011-03-02 | Discharge: 2011-03-02 | Disposition: A | Discharge status: Home / Self Care | Source: Ambulatory Visit | Attending: Gastroenterology | Admitting: Gastroenterology

## 2011-03-02 DIAGNOSIS — R110 Nausea: Secondary | ICD-10-CM

## 2011-03-06 NOTE — Discharge Summary (Signed)
[name], [name]               ACCOUNT NO.:  [id number]   MEDICAL RECORD NO.:  [id number]          PATIENT TYPE:  EMS   LOCATION:  ED                           FACILITY:  [facility]   PHYSICIAN:  Theodosia Paling, MD    DATE OF BIRTH:  June 02, 1951   DATE OF ADMISSION:  08/12/2008  DATE OF DISCHARGE:  08/14/2008                               DISCHARGE SUMMARY   PRIMARY CARE PHYSICIAN:  Dr. Dorothyann Peng.   ADMITTING HISTORY:  Please refer to the admission note dictated by Dr.  Buena Irish on August 12, 2008, under history of present illness.   HOSPITAL COURSE:  Following issues were addressed during the  hospitalization.  1. Pulmonary embolism.  Venous duplex is negative for clot in the      lower extremity.  The patient received Lovenox and Coumadin and was      instructed how to use.  She will continue this at home and will      follow up with the primary care physician, Dr. Allyne Gee on Monday      for her CBC and INR check.  2. The patient did not have any oxygen desaturation or hemodynamic      instability.  Tolerated anticoagulants fairly well.  3. Hyperlipidemia.  Crestor was continued.  4. Hypertension.  Diuretics were continued.   DISCHARGE DIAGNOSES:  1. Pulmonary embolism.  2. Hypertension.  3. Hyperlipidemia.   DISCHARGE MEDICATIONS:  1. Lovenox 110 mg subcu daily.  2. Coumadin 7.5 mg p.o. daily.   The patient is instructed to continue with home medications which  include;  1. Crestor 5 mg p.o. daily.  2. Maxzide, which is 37/25 mg p.o. daily.  3. Nexium 40 mg p.o. daily.   PROCEDURE PERFORMED:  None.   IMAGING PERFORMED:  None.   DISPOSITION:  The patient will follow up with Dr. Allyne Gee on Monday for  CBC and INR check and thereafter, she also needs work up for the cause  of her pulmonary embolism.   TOTAL TIME SPENT:  45 minutes.      Theodosia Paling, MD  Electronically Signed     NP/MEDQ  D:  08/13/2008  T:  08/14/2008  Job:  [id number]   cc:    Candyce Churn. Allyne Gee, M.D.

## 2011-03-06 NOTE — Consult Note (Signed)
NAMECLAUDEAN, [name]               ACCOUNT NO.:  [id number]   MEDICAL RECORD NO.:  [id number]          PATIENT TYPE:  EMS   LOCATION:  ED                           FACILITY:  [facility]   PHYSICIAN:  Loma Sender, MD         DATE OF BIRTH:  06-Jun-1951   DATE OF CONSULTATION:  02/11/2009  DATE OF DISCHARGE:  02/11/2009                                 CONSULTATION   REASON FOR CONSULTATION:  Chest pain.   PRIMARY CARE PHYSICIAN:  Dorothyann Peng, M.D.   HISTORY OF PRESENT ILLNESS:  Ms. [name] is a 60 year old female with  past medical history significant for pulmonary embolism on Coumadin  therapy, hypertension, dyslipidemia, and atypical chest pain with  numerous workups in the past with no evidence of cardiac involvement,  who presented to the emergency department with a 3 day history of chest  pain. The patient reports that her pain began in her left back on  Tuesday. She reported that she initially felt that the pain was  musculoskeletal in nature but when it failed to improve, she became more  concerned. On Wednesday and Thursday, the intensity of pain worsened and  it began to radiate around to her left chest, into her left breast bone.  She denied any association of the pain with activity. She reported that  her pain was constant in nature and was unrelenting. Nothing seemed to  make the pain worse but she did get some improvement with her Percocet  pain medication at home. She denied any association of shortness of  breath but might have experienced some palpitations. She denies any  syncope or presyncopal symptoms, diaphoresis, or nausea and vomiting.  She did comment that the pain seemed to be worse with her breathing. She  denies any recent strenuous activity. When her symptoms continued to  bother her, she came to the emergency department for evaluation. In the  emergency department, patient was found to have an INR of 2.5 with 3  sets of cardiac enzymes that were negative for  signs of ischemia. CTA  was performed of the chest but she had no evidence of pulmonary  embolism. EKG showed normal sinus rhythm with T-wave inversions in V1,  otherwise no signs of ST elevation or T-wave changes. With these  findings, Internal Medicine was consulted for further recommendations.   PAST MEDICAL HISTORY:  1. Pulmonary embolism.  2. Hypertension.  3. Gastroesophageal reflux disease.  4. Dyslipidemia.  5. Status post cholecystectomy.  6. Status post hip replacement.  7. Status post hysterectomy.  8. Status post breast reduction surgery.   ALLERGIES:  PENICILLIN, VICODIN, IBUPROFEN.   MEDICATIONS:  1. Crestor 5 mg daily.  2. Maxzide 1 tablet daily.  3. Claritin 10 mg daily.  4. Coumadin.  5. Nexium 40 mg daily.   SOCIAL HISTORY:  The patient lives in [place]. Has no tobacco,  alcohol, or IV drug use. She is a travel agent.   FAMILY HISTORY:  Negative for coronary artery disease, peripheral  vascular disease, or strokes.   REVIEW OF SYSTEMS:  Complete review of systems  was performed and is  otherwise negative unless noted in the history of present illness.   PHYSICAL EXAMINATION:  VITAL SIGNS:  Blood pressure 116/76, temperature  97.7, pulse 81, respiratory rate 20. Sating 99% on room air.  GENERAL:  This is an African-American female in no acute distress.  HEENT:  Normocephalic and atraumatic. Extraocular muscles intact. Pupils  are equal, round, and constricted. Nares are patent bilaterally.  Oropharynx is clear without erythema or exudate. Moist mucous membranes.  Hearing is grossly intact.  NECK:  Supple without lymphadenopathy, jugular venous distention, or  carotid bruits.  LUNGS:  Clear to auscultation bilaterally without wheezes or rhonchi.  CARDIOVASCULAR:  Regular rate and rhythm. Without murmur, rub, or  gallop. 2+ pulses at the wrist and ankle.  EXTREMITIES:  No lower extremity edema.  ABDOMEN:  Soft, nontender, and nondistended. Bowel sounds  are active.  NEUROLOGIC:  Cranial nerves 2-12 are intact. Muscle strength is  symmetric. Sensation is grossly normal.  SKIN:  No rashes or lesions noted.  MUSCULOSKELETAL:  No bony abnormalities, muscle tenderness, or active  synovitis.  NEUROLOGIC:  Alert and oriented times four. Memory is intact to recent  and distant events. Mood is appropriate. Affect is full.   LABORATORY DATA:  Troponin less than 0.05 x1 at 1:30 a.m. At 8:45 p.m.  troponin less than 0.01. At 11:30, troponin less than 0.05. Basic  metabolic profile  shows sodium of 141, potassium 2.9, chloride 107,  bicarb 27, glucose 100, BUN 18, creatinine 0.88, calcium 8.9. INR 2.5.  CBC shows a white count of 6.4, hemoglobin 12.6, platelet count 204,000.   RADIOLOGY:  CT of the chest:  No pulmonary emboli identified.   EKG shows normal sinus rhythm with T-wave inversions in V1, otherwise  this is a normal EKG with rate of 79 beats per minute. Review of  previous inpatient results, heart catheterization of September 03, 2006,  shows non-critical coronary artery disease.   ASSESSMENT/PLAN:  This is a 60 year old female who presents to the  emergency department with left sided back and chest pain of 3 days  duration with negative cardiac and pulmonary workup in the emergency  department.  1. LEFT BACK AND CHEST PAIN:  My initial impression is that this is      either musculoskeletal in nature versus psychosomatic chest pain. I      have spoken for 30 minutes with the patient about other possible      causes for her chest pain including both musculoskeletal and      psychosomatic etiologies. I think the likelihood of this being      cardiac chest pain is very low, considering recent heart      catheterization within the last 3 years with a reported normal      stress test within the last 6 months, and with appropriate      management of risk factors including hyperlipidemia. The chance of      this being cardiac chest pain is  almost non-existent. As far as      pulmonary source for the chest pain, the patient has had a history      of pulmonary emboli but is currently therapeutic on her Coumadin      and had CTA here that shows no PE. Therefore, without cardiac or      pulmonary causes for the chest pain, I feel that hospital admission      is unwarranted and serves no purpose at this time. I  have      recommended that the patient followup with her primary care      physician in the next 3 to 5 days and to discuss alternative      modalities for working up her chest pain, including a therapeutic      trial with SSRI anti-depressant, versus physical therapy assessment      for musculoskeletal causes of chest pain. The patient will be      provided with      prescription of Percocet to assist with her pain over the next      several days until she is able to followup with her primary care      Samiel Peel. The patient was instructed to return to the emergency      department with any other concerning symptoms.      Loma Sender, MD  Electronically Signed     TJ/MEDQ  D:  02/11/2009  T:  02/11/2009  Job:  [id number]   cc:   Izola Price. [place], [place]

## 2011-03-06 NOTE — Discharge Summary (Signed)
NAMEADAMARY, SAVARY               ACCOUNT NO.:  [id number]   MEDICAL RECORD NO.:  [id number]          PATIENT TYPE:  INP   LOCATION:  1312                         FACILITY:  [facility]   PHYSICIAN:  Marcellus Scott, MD     DATE OF BIRTH:  01-15-1951   DATE OF ADMISSION:  05/03/2009  DATE OF DISCHARGE:  05/10/2009                               DISCHARGE SUMMARY   PRIMARY MEDICAL DOCTOR:  Dr. Dorothyann Peng.   PRIMARY GASTROENTEROLOGIST:  Dr. Charna Elizabeth.   DISCHARGE DIAGNOSES:  1. Abdominal pain secondary to gastroparesis.  2. Gastroparesis, ? secondary to narcotic use.  3. Fatty liver.  4. Gastroesophageal reflux disease.  5. Anemia, for outpatient evaluation.  6. Hyperlipidemia.  7. Hypertension.  8. Tobacco abuse.  9. History of pulmonary embolism, has completed Coumadin therapy.  10.Status post left total hip arthroplasty on February 16, 2009.   DISCHARGE MEDICATIONS:  These are unchanged from those prior to  admission.  1. Maxzide 37.5/25mg , one p.o. daily.  2. Crestor 10 mg p.o. daily.  3. Nexium 40 mg p.o. daily.  4. Ambien 10 mg p.o. q.h.s. p.r.n.  5. Tylenol 650 mg p.o. every 4 to 6 hours as needed for pain.   PROCEDURES:  1. Gastric emptying scan on May 09, 2009.  Impression:  Borderline      delayed gastric emptying.  2. CT angiogram of the chest, May 06, 2009.  Impression:  No evidence      of pulmonary emboli, thoracic aortic aneurysm, or dissection.  Tiny      bilateral pleural effusions with mild bibasilar atelectasis.  3. MRI of the abdomen with and without contrast.  Impression:      a.     No acute abdominal process.      b.     The CT abnormality corresponds to focal fat deposition       within the pancreas.  No suspicious pancreatic lesion.      c.     Cholecystectomy without biliary ductal dilatation.  4. CT of the abdomen with contrast.  Impression:  Hypodensity uncinate      process of the pancreas, may be related to artifact although a mass      cannot  be excluded.  Elective MR of the abdomen with attenuation to      pancreas was recommended and done.  5. CT of the pelvis.  Impression:  No inflammation noted surrounding      the appendix.  Limited evaluation of the pelvis secondary to streak      artifact caused by hip replacement.  6. EGD by Dr. Virginia Rochester.  Findings:  Changes of gastroparesis with solid      food remaining in the stomach from at least over 24 hours      previously.   PERTINENT LABS:  Urine culture 15,000 colonies, multiple bacterial  morphocytes.  D-dimer 1.21.  Urine culture from May 05, 2009, no  growth.  CBC, May 06, 2009, hemoglobin 10.2, hematocrit 32, white blood  cells 6.4, platelets 192, MCV 86.  Urinalysis May 04, 2009, three to 6  white blood cells and many bacteria.  Comprehensive metabolic panel May 05, 2009, only remarkable for total protein 5.3, albumin 2.8.  BUN was  5, creatinine 0.76.  Cardiac panel was negative.  Lipase 23, amylase 65,  TSH 3.679.   CONSULTATIONS:  Gastroenterology, Dr. Elnoria Howard, Dr. Virginia Rochester, Dr. Loreta Ave.   HOSPITAL COURSE AND PATIENT DISPOSITION:  Ms. Weger is a pleasant 60-  year-old [place] American female patient with history of previous  pulmonary embolism who completed Coumadin therapy, hypertension,  hyperlipidemia, gastroesophageal reflux disease, status post hip  arthroplasty in April following which she was on Percocet for  approximately 2 months which she completed in June of 2010.  She now  presented with epigastric and right upper quadrant abdominal pain which  was fairly sudden in onset with associated nausea, vomiting.  She had no  sickly contacts and the only unusual food was eating some [place] pasta  salad.  She was admitted to the hospital for further evaluation and  management.  1. Abdominal pain.  Patient was admitted to the hospital.  She was      made nil per oral.  She had extensive evaluation done including CT      of the abdomen and pelvis, MRI of the abdomen which  failed to      reveal the cause.  Initially, she was thought to have acute      gastroenteritis or food poisoning.  However, once she was started      on clear liquid, she still continued to complain of abdominal pain.      GI was consulted.  They proceeded to do an upper endoscopy which      showed retained food of more than 24 hours.  They assessed her to      have gastroparesis and recommended discontinuing her narcotics.      Gastric emptying scan was performed which showed mild abnormality      and emptying.  Dr. Loreta Ave saw her yesterday and recommended avoiding      pain medications and  try probiotics.  She did not recommend any      prokinetics.  She recommended followup in her office in 2 weeks      after discharge.  Patient is tolerating oral diet.  She still has      mild pain which seems to be gradually slowly improving.  She has      had BMs.  She is hemodynamically stable and apart from minimal      tenderness in the right middle quadrant her abdomen is soft.      Patient at one point also complained of some pressure-like      sensation in the right lower chest.  Given her history of previous      pulmonary embolism, D-dimer was performed which was positive and      this was followed up by CT chest which was negative for pulmonary      embolism.  She did not have any dyspnea.  2. Anemia which seems to be of chronic disease but this needs to be      further evaluated as an outpatient.  If she has not had a recent      colonoscopy, one needs to be performed.  3  Hypertension.  Patient had not been on antihypertensives in the  hospital; however, her blood pressures are now in the 120s/90s and  155/82 range.  We will ask her to resume her home Maxzide.  1. Tobacco abuse.  Cessation counseling done and patient verbalizes      understanding.  Patient has declined a nicotine patch.   Patient at this time is stable for discharge home and to follow up with  her primary M.D..  She is  to also call Dr. Kenna Gilbert office for followup in  the next 2 weeks.   TIME TAKEN IN COORDINATING THIS DISCHARGE:  Less than 30 minutes.      Marcellus Scott, MD  Electronically Signed     AH/MEDQ  D:  05/10/2009  T:  05/10/2009  Job:  [id number]   cc:   Candyce Churn. Allyne Gee, M.D.  Fax: [phone]   JXBJYN WGN FAOZ, M.D.  Fax: [phone]

## 2011-03-06 NOTE — Consult Note (Signed)
**Luna Veronica via Obfuscation** [name], [name]               ACCOUNT NO.:  [id number]   MEDICAL RECORD NO.:  [id number]          PATIENT TYPE:  INP   LOCATION:  1312                         [facility]ACILITY[facility]  [facility]   PHYSICIAN:  Jordan Hawks. Elnoria Howard, MD    DATE OF BIRTH:  Sep 12, 1951   DATE OF CONSULTATION:  05/06/2009  DATE OF DISCHARGE:  05/10/2009                                 CONSULTATION   This is a GI consultation which was originally performed on May 06, 2009.   REASON FOR CONSULTATION:  Right upper quadrant abdominal pain.   HISTORY OF PRESENT ILLNESS:  This is a 60 year old female with a past  medical history of pulmonary embolism, hyperlipidemia, hypertension,  gastroesophageal reflux disease, status post cholecystectomy,  hysterectomy, and bilateral breast reduction is admitted to the hospital  with complaints of right upper quadrant pain.  The patient states that  the pain was acute in onset and it woke her up from sleep.  She denies  having any trauma to the area, no associated maneuvers to improve the  pain.  There is no worsening of the pain with movement.  She states that  it is quite severe and she is unable to find any relief from it.  As a  result of her symptoms, she also had associated nausea and vomiting.  In  the past, she has had issues with abdominal pain and in 2007 she was  admitted for abdominal pain secondary to colitis.  She responded to  Cipro, Flagyl at that time, and did not require any further evaluation.  During this hospitalization, she underwent further workup and there is  no evidence of any abnormalities on the CT scan, aside from a  questionable mass at the head of the pancreas, which turned out to be  fat-sparing on the MRI scan.  She also underwent a CT angio, which was  also negative for pulmonary embolism.  Because of the persistent pain, a  GI consultation was requested for further evaluation and treatment.   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   FAMILY HISTORY:  Noncontributory.   SOCIAL HISTORY:  Negative for illicit drug use, but she does smoke 5 to  6 cigarettes per week and some occasional alcohol, approximately 3 to 4  mixed drinks per week.   REVIEW OF SYSTEMS:  As stated above in the history of present illness.  Otherwise, negative.   ALLERGIES:  PENICILLIN, LATEX, IBUPROFEN, MORPHINE SULFATE.   PHYSICAL EXAMINATION:  VITAL SIGNS:  Stable.  GENERAL:  The patient is in no acute distress.  She is very  uncomfortable from the right upper quadrant pain.  HEENT:  Normocephalic, atraumatic.  Extraocular muscles intact.  NECK:  Supple.  No lymphadenopathy.  LUNGS:  Clear to auscultation bilaterally.  CARDIOVASCULAR:  Regular rate and rhythm.  ABDOMEN:  Obese, soft, tender in the right upper quadrant.  No evidence  of any costochondritis.  No rebound, rigidity.  Positive bowel sounds.  EXTREMITIES:  No cyanosis, clubbing, or edema.  LABORATORY VALUES:  White blood cell count 6.4, hemoglobin 10.2,  platelets 192,000.   IMPRESSION:  1. Right upper quadrant abdominal pain of unclear etiology.  2. Other multiple medical problems.  I am unable to define the cause      of her pain.  There is no evidence of any costochondritis with      palpation in that area, but it appears that her nausea and vomiting      is a problem secondary to the pain.  I feel that an endoscopic      evaluation is warranted, but I do not feel that the yield would be      significant.   PLAN:  For an EGD and then further recommendations depending on  findings.      Jordan Hawks Elnoria Howard, MD  Electronically Signed     PDH/MEDQ  D:  05/10/2009  T:  05/10/2009  Job:  [id number]   cc:   Candyce Churn. Allyne Gee, M.D.  Fax: [phone]

## 2011-03-06 NOTE — Op Note (Signed)
[name], [name]               ACCOUNT NO.:  [id number]   MEDICAL RECORD NO.:  [id number]          PATIENT TYPE:  INP   LOCATION:  1312                         FACILITY:  [facility]   PHYSICIAN:  Georgiana Spinner, M.D.    DATE OF BIRTH:  [birth year]   DATE OF PROCEDURE:  05/07/2009  DATE OF DISCHARGE:                               OPERATIVE REPORT   PROCEDURE:  Upper endoscopy.   INDICATIONS:  Abdominal pain, nausea and vomiting.   ANESTHESIA:  Fentanyl 100 mcg, Versed 10 mg.   PROCEDURE IN DETAIL:  With the patient mildly sedated in the left  lateral decubitus position the Pentax videoscopic endoscope was inserted  in the mouth and passed under direct vision through the esophagus which  appeared normal into the stomach and food solid material was seen in the  stomach, beans and other partially digested material.  We advanced  distally and fundus, body, antrum appeared normal.  Pylorus was patent  and easily passed the endoscope through this into the duodenal bulb and  second portion duodenum all of which appeared normal.  From this point  the endoscope was slowly withdrawn taking circumferential views of  duodenal mucosa until the endoscope had been pulled back into the  stomach and placed in retroflexion to view the stomach from below.  The  endoscope was then straightened and withdrawn taking circumferential  views of remaining gastric and esophageal mucosa.  The patient's vital  signs, pulse oximeter remained stable.  The patient tolerated procedure  well without apparent complication.   FINDINGS:  Changes of gastroparesis with solid food remaining in the  stomach from at least over 24 hours previously.   PLAN:  The patient has gastroparesis as the only finding possibly  secondary to narcotic use but could be primary.  Will get a gastric  emptying study and discuss further with the patient.           ______________________________  Georgiana Spinner, M.D.     GMO/MEDQ  D:   05/07/2009  T:  05/07/2009  Job:  [id number]   cc:   Candyce Churn. Allyne Gee, M.D.  Fax: [phone]   Jordan Hawks. Elnoria Howard, MD  Fax: [phone]

## 2011-03-06 NOTE — H&P (Signed)
NAMECALA, KRUCKENBERG               ACCOUNT NO.:  [id number]   MEDICAL RECORD NO.:  [id number]          PATIENT TYPE:  INP   LOCATION:  1439                         FACILITY:  [facility]   PHYSICIAN:  Lonia Blood, M.D.      DATE OF BIRTH:  Mar 10, 1951   DATE OF ADMISSION:  10/27/2008  DATE OF DISCHARGE:                              HISTORY & PHYSICAL   PRIMARY CARE PHYSICIAN:  Dr. Dorothyann Peng.   PRESENTING COMPLAINT:  Left-sided chest pain.   HISTORY OF PRESENT ILLNESS:  The patient is a 60 year old African  American female with known diagnosis of pulmonary embolism in October  2009 who was readmitted back on August 24, 2008 with chest pain but  found to have no new PE.  The patient presented again yesterday with  left-sided chest pain.  Pain is pleuritic in nature, mainly at the  posterior part of her chest.  Gets worse with deep inspiration.  No  associated cough.  Rated as 6-7 out of 10.  No fever, no chills.  No  hemoptysis.  The patient's PT/INR was apparently low last week.  INR was  1.1 and today it was 1.5.  The patient is worried about repeat CT chest  and also worried about the new PE since her INR has been subtherapeutic  for about a week.   PAST MEDICAL HISTORY:  1. Pulmonary embolism.  2. Hypertension.  3. GERD.  4. Dyslipidemia.  5. She is status post cholecystectomy.  6. Status post hip replacement.  7. Status post hysterectomy.  8. Status post breast reduction surgery.   ALLERGIES:  The patient is allergic to PENICILLIN, VICODIN and  IBUPROFEN.   MEDICATIONS:  1. Crestor 5 mg daily.  2. Maxzide 1 tablet daily.  3. Mucinex DM 1 tablet q.12 h.  4. Claritin 10 mg daily.  5. Coumadin currently down to 5 mg and 2.5 mg.  Previously on 7.5mg  in      the hospital.  6. Nexium 40 mg daily.   SOCIAL HISTORY:  Will review social history.  The patient lives in  [place].  Denied any tobacco, alcohol or IV drug use.  Her alcohol  intake is more social.  She is a  travel agent.   FAMILY HISTORY:  Noncontributory.   REVIEW OF SYSTEMS:  A 14-point review of systems is negative except per  HPI.   PHYSICAL EXAMINATION:  VITAL SIGNS:  Temperature 98.5, blood pressure  116/78, pulse 93, respiratory rate 18, oxygen saturation 99% on 2  liters.  GENERAL:  The patient is awake, alert, oriented, in no acute distress.  HEENT: PERRL.  EOMI.  NECK:  Supple.  No JVD, no lymphadenopathy.  RESPIRATORY:  She has good air entry bilaterally.  No wheezes, no rales.  CARDIOVASCULAR SYSTEM:  She has S1 and S2, no murmur.  ABDOMEN:  Soft, nontender with positive bowel sounds.  EXTREMITIES:  No edema, cyanosis or clubbing.   LABORATORY DATA:  White count 6.9, hemoglobin 13.3, platelet count  263,000.  Sodium 140, potassium 3.9, chloride 103, CO2 27, glucose 97,  BUN 25, creatinine 0.9, calcium  9.6.  PT 18.3, INR 1.5, PTT of 30.  Initial cardiac enzymes negative.  Chest x-ray showed suboptimal  inspiration but no acute cardiopulmonary disease.   ASSESSMENT:  Therefore, this is a 60 year old female with known history  of pulmonary embolism 3 months ago, presenting with pleuritic chest pain  and subtherapeutic INR.  The biggest worry is that the patient may have  had a repeat pulmonary embolism.  If so with no apparent trigger  factors.  The patient then may have some genetic defects that  necessitate lifelong Coumadin use.   PLAN:  At this point will proceed as follows:  1. Pleuritic chest pain, possible repeat pulmonary embolism.  Will      encourage the patient to have a repeat CT chest now to rule out PE.      If she is positive for new PE, will still continue with Lovenox and      Coumadin but the patient will definitely continue to have it for      life.  If negative, we will then use Lovenox and Coumadin until the      patient is therapeutic or arrange for home Lovenox with continued      Coumadin.  Other with the patient's PT/INR has been low, putting       her at higher risk.  As far as her pain is concerned, we explained      to the patient that she may be having pleuritic chest pain from the      scarring secondary to the previous PEs and it may take up to 6      months for the clot to actually recanalize.  2. Hypertension.  Will treat the patient using her home medications.  3. Gastroesophageal reflux disease.  Will continue with PPI.  4. Dyslipidemia.  Again will continue with her Crestor.   Further treatment will depend on the patient's response to initial  measures and the results of the repeat chest CT.      Lonia Blood, M.D.  Electronically Signed     LG/MEDQ  D:  10/28/2008  T:  10/28/2008  Job:  [id number]

## 2011-03-06 NOTE — Op Note (Signed)
[name], [name]               ACCOUNT NO.:  [id number]   MEDICAL RECORD NO.:  [id number]          PATIENT TYPE:  INP   LOCATION:  5022                         FACILITY:  [facility]   PHYSICIAN:  Feliberto Gottron. Turner Daniels, M.D.   DATE OF BIRTH:  March 24, 1951   DATE OF PROCEDURE:  02/16/2009  DATE OF DISCHARGE:                               OPERATIVE REPORT   PREOPERATIVE DIAGNOSIS:  End-stage arthritis, left fifth.   POSTOPERATIVE DIAGNOSIS:  End-stage arthritis, left fifth.   PROCEDURE:  Left total hip arthroplasty using a 52-mm ASR cup, 18 x 13 x  42 S-ROM stem, 18 D small cone, NK+3 ultimate head.   SURGEON:  Feliberto Gottron. Turner Daniels, MD   FIRST ASSISTANT:  Shirl Harris, PA-C   ANESTHETIC:  General endotracheal.   ESTIMATED BLOOD LOSS:  400 mL.   FLUID REPLACEMENT:  1500 mL of crystalloid.   DRAINS PLACED:  A Foley catheter.   URINE OUTPUT:  300 mL.   INDICATIONS FOR PROCEDURE:  The patient is a 60 year old woman with an  end-stage arthritis of her left hip, had a successful right total hip  arthroplasty few years ago by another physician and now desires left  total hip arthroplasty.  Risks and benefits of surgery now to the  patient and were discussed preoperatively.   DESCRIPTION OF PROCEDURE:  The patient identified by armband and  received IV antibiotics preoperatively in the holding area at [facility] and was then taken to operating room #5.  Appropriate  anesthetic monitors were attached and general endotracheal anesthesia  induced with the patient in supine position and rolled into the right  lateral decubitus position fixed there with a silver Mark II pelvic  clamp and the left lower extremity was prepped and draped in sterile  fashion from the ankle to the hemipelvis.  Skin along the lateral hip  and thigh was then infiltrated with 20 mL of 0.5% Marcaine and  epinephrine solution after performing a standard time-out procedure.  We  began the operation himself by  making a posterolateral approach to the  hip utilizing a 15-cm incision centered over the greater trochanter.  Small bleeders in the skin and subcutaneous tissue were identified and  cauterized.  IT band cut along the skin incision.  We then placed a  Cobra retractors between the gluteus minimus and the superior hip joint  capsule and between the quadratus femoris and the inferior hip joint  capsule, it was isolated the short external rotators and piriformis,  which were then tagged with a #2 Ethibond suture and cut off their  insertion on the intertrochanteric crest.  Exposing the hip joint  capsule, developed into an acetabular based flap and likewise tagged  with two #2 Ethibond sutures after detaching the flap from the femoral  neck.  This exposed the arthritic femoral head hip.  The hip was then  flexed and internally rotated dislocating the femoral head and standard  neck cut performed one fingerbreadth above the lesser trochanter.  Using  a Homan retractor, we then translated the proximal femur anteriorly  levering off the anterior  column.  A spike cover was placed in the  cotyloid notch and posteroinferior wing retractor at the junction of the  ischium and the acetabulum.  This exposed the labrum, which was then  excised with electrocautery and we sequentially reamed the acetabulum up  to a 51-mm basket reamer obtaining good coverage in all quadrants and  just getting into the subchondral bone.  We then hammered into place a  52-mm DePuy ASR cup and 45 degrees of abduction and 20 degrees of  anteversion.  Hip was flexed and internally rotated exposing the  proximal femur which, was entered with a box cutting chisel followed by  the initiating reamer followed by axial reaming up to 11-mm axial reamer  were just started to get a little bit of chatter and then going by 0.5  mm up to a 13.5-mm axial reamer and going down two thirds of the way  with a 14-mm reamer.  We were then  conically reamed up to an 18 D cone  and reamed the calcar up to a small calcar and 18 D small trial was then  hammered into place followed by a 13-mm trial stem, 42 base neck in the  same version as the femur, which was only about 5 degrees.  Trial  reduction was performed with an NK +0 and NK +3 ultimate head.  We  dialed in an extra 10 degrees of anteversion in relation to the calcar,  instability at 90 degrees of flexion was about 75 degrees.  The hip  could not be dislocated in extension and external rotation.  With the  fine extension, the knee could easily be bent to 120 degrees.  At this  point, the trial components were removed.  We hammered into place an 55  D small ZTT1 cone followed by the 13.5 reamer through the cone.  An 18 x  13 x 42 S-ROM stem was then hammered into place with an extra 10 degrees  of anteversion in relation to the Cone, it seated nicely and NK +3  ultimate head was then hammered onto the stem and the hip once again  reduced.  Stability was once again excellent.  The wound irrigated out  with normal saline solution and we were then repaired the acetabular  based flap and the short external rotator was back to the  intertrochanteric crest through drill holes using #2 Ethibond suture.  The IT band was closed with running #1 Vicryl suture, the subcutaneous  tissue was 0 and 2-0 undyed Vicryl suture and the skin with running  interlocking 3-0 nylon suture.  Xeroform and a Mepilex dressing was then  applied.  The patient was unclamped, rolled supine, and awakened, taken  to recovery room without difficulty.  Postoperative radiographs showed  well-placed, well-fixed prosthesis and the leg lengths were then 2-3 mm  of the contralateral side.      Feliberto Gottron. Turner Daniels, M.D.  Electronically Signed     FJR/MEDQ  D:  02/18/2009  T:  02/18/2009  Job:  [id number]

## 2011-03-06 NOTE — Discharge Summary (Signed)
[name], [name]               ACCOUNT NO.:  [id number]   MEDICAL RECORD NO.:  [id number]          PATIENT TYPE:  INP   LOCATION:  4733                         FACILITY:  [facility]   PHYSICIAN:  Richarda Overlie, MD       DATE OF BIRTH:  [birth year]   DATE OF ADMISSION:  08/24/2008  DATE OF DISCHARGE:  08/25/2008                               DISCHARGE SUMMARY   1. Chest pain secondary to recent pulmonary embolism.  2. Hypertension, controlled.  3. Dyslipidemia.   SUBJECTIVE:  This is a 60 year old female with a recent history of  pulmonary embolism who presented to the ER with a chief complaint of  recurrent chest pain.  The patient was admitted to this facility on  August 12, 2008, and discharged on August 13, 2008, after she was  found to have a small left-sided pulmonary embolism without any evidence  of DVT.  The patient has not complained of any fever, cough or nasal  congestion.  However, she complained of postnasal drip for the last 3  weeks.  She describes the chest pain as being sharp, pleuritic in  nature, radiating up into her neck, associated with diaphoresis, nausea  and sweating.  The pain is also aggravated by breathing.  She had a  cardiac catheterization in 2007, that showed no coronary artery disease.  The patient was found to be therapeutic on her INR.  She has been on  Coumadin.  She has been on Lovenox 110 subcu daily, which is being  discontinued at this time as the patient has achieved therapeutic level  of INR and has been on Lovenox for more than 5 days.  The patient serial  troponins remained negative.  EKG showed normal sinus rhythm without any  obvious ST-T segment changes.  The patient remained hemodynamically  stable, except for mild hypotension noted with the nitro paste.  I have  recommended to the patient that the patient get an outpatient stress  test when she is stable from her acute episode of pulmonary embolism,  maybe in a couple of months, and  this is to be arranged by her primary  care Khyrie Masi, Dr. Dorothyann Peng at [facility] , as we may  get a false-positive  result if it is done in the immediate setting of  her recent PE .  The patient is complaining of persistent pain  because  of her PE for which she has been initiated on Percocet.  However,  because of a history of hypertension, would make sure that she not have  any evidence of aortic dissection, although she was not particularly  hypertensive when she came to the ER.  Therefore, a CT scan with  contrast to rule out aortic dissection was obtained and found to be  negative .   DISCHARGE MEDICATIONS:  1. Percocet 10/325 p.o. q.6 h., p.r.n. pain.  2. Claritin 10 mg p.o. daily.  3. Mucinex DM 1 tablet p.o. q.12.  4. Crestor 5 mg p.o. daily.  5. Maxzide 1 tablet p.o. daily.  6. Coumadin 15 mg Tuesdays and Thursdays and 10 mg  other days.   MEDICATIONS DISCONTINUED:  Lovenox.   DISPOSITION:  The patient is to follow up with primary care Nick Armel,  Dr. Dorothyann Peng at [facility] in 5-7 days.  Repeat INR on  Friday to ensure that the patient stays therapeutic.  The patient is  being discharged after ambulation and if found to be stable.      Richarda Overlie, MD  Electronically Signed     NA/MEDQ  D:  08/25/2008  T:  08/25/2008  Job:  [id number]

## 2011-03-06 NOTE — H&P (Signed)
NAMECARYSSA, [name]               ACCOUNT NO.:  [id number]   MEDICAL RECORD NO.:  [id number]          PATIENT TYPE:  EMS   LOCATION:  ED                           [facility]ACILITY[facility]  [facility]   PHYSICIAN:  Thomasenia Bottoms, MDDATE OF BIRTH:  03/01/1951   DATE OF ADMISSION:  08/12/2008  DATE OF DISCHARGE:                              HISTORY & PHYSICAL   CHIEF COMPLAINT:  Pulmonary embolism.   HISTORY OF PRESENT ILLNESS:  Ms. [name] is a 60 year old woman who was  sent to the emergency department directly from the radiology department  when a small left pulmonary embolism was found on her CT scan.  The  patient presented to her primary care physician's office earlier today  with complaints of worsening intermittent chest pains for two days.  The  patient says the pains are sharp in the left side of her chest and are  intermittent, but they seem to be getting more severe over the last at  48 hours.  It got to the point where the pain was so sharp that she felt  like she was having a heart attack or that something was very wrong.  She also says that over the last two days she has had no energy.  She  has had to take naps and rest which is not her baseline.  She denies any  frank shortness of breath or trouble breathing.  She has not had any  cough, hemoptysis or fever, just these intermittent sharp pains in her  chest.  She went to her primary with these complaints and was sent to  outpatient radiology.  Her scan was positive for PE.  The patient came  directly from the outpatient radiology department to the emergency  department.   PAST MEDICAL HISTORY:  1. Hypercholesterolemia.  2. Hypertension.  3. History of diverticulosis.  4. She says that she believes she had a blood clot in her leg many      years ago but never took Coumadin for it.   PAST SURGICAL HISTORY:  1. 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  2. History of cholecystectomy.  3. Total  hysterectomy.  4. Bilateral breast reduction surgery approximately 13 years ago.  5. She also had cardiac catheterization in November 2007 which      revealed normal coronary arteries.  6. The patient also tells me she had a colonoscopy in 2007 by Dr.      Loreta Ave, her gastroenterologist.  7. She has also had a mammogram within the last 12 months.   MEDICATIONS ON ARRIVAL:  1. Crestor 5 mg daily.  2. Maxzide 1 tablet daily.  3. Nexium 40 mg daily.   SOCIAL HISTORY:  She occasionally will smoke a cigarette when she has a  cocktail, but she does not smoke every day.  She does not have the  cocktail every day.  She works as a Firefighter.   FAMILY HISTORY:  No history of blood clots that she is aware of.   REVIEW OF SYSTEMS:  CONSTITUTIONAL:  The patient says her appetite is  excellent.  She  has lost about 20 pounds over the last six months.  Weight loss has been on intentional.  HEENT: She has had some allergy  trouble with some runny eyes, stuffy nose, a little sore throat.  No  fevers.  She has had a little stuffy ears as well.  She has been taking  over-the-counter medication for this.  CARDIOVASCULAR:  Recent chest  pains as mentioned above.  No trouble with lower extremity edema.  RESPIRATORY:  No shortness of breath.  No hemoptysis or wheezing.  GI:  No diarrhea or constipation.  Has not seen any bright red blood per  rectum.  Has not vomited any blood.  GU: No blood in her urine.  MUSCULOSKELETAL: She has occasional aches and pains but nothing  consistent.  SKIN:  No rashes.  No open lesions.  All other systems  reviewed and are negative.   PHYSICAL EXAMINATION:  VITAL SIGNS:  On arrival, her blood pressure was  138/91 with temperature 97.4, pulse initially 114, respiratory rate 20  and O2 sat 99% on room air.  GENERAL:  The patient is well-nourished and well-developed in no acute  distress.  HEENT:  Normocephalic, atraumatic.  Pupils are equal and round.  Sclerae   nonicteric.  Oral mucosa moist.  NECK: Supple.  No lymphadenopathy.  No thyromegaly.  No jugular venous  distention.  CARDIAC:  Regular rate and rhythm.  No murmurs, gallops or rubs.  LUNGS:  Clear to auscultation.  No wheezes, rhonchi or rales.  ABDOMEN:  Soft, nontender, nondistended.  Normoactive bowel sounds.  No  hepatosplenomegaly.  EXTREMITIES:  No evidence of clubbing, cyanosis or pitting edema.  She  has palpable DP pulses bilaterally.  NEUROLOGICAL:  She is alert and oriented x3.  Cranial nerves II-XII are  intact grossly.  She has 5/5 strength in upper and lower extremities.  Her sensory exam is intact grossly in her upper and lower extremities.  Normal muscle tone and bulk.  SKIN:  Intact.  No open lesions or rashes.  MUSCULOSKELETAL:  No effusions of her joints.  Good range of motion.   LABORATORY DATA:  White count 7.5, hemoglobin 14.4, hematocrit 44,  platelet count 212,000.  Troponin less than 0.05.  PT 12.8, INR 1, PTT  25.  Sodium 137, potassium 3.4, chloride 104, bicarb 24, glucose 92, BUN  20, creatinine 0.74.  AST within normal limits.  ALT within normal  limits.  The preliminary report was reviewed.  It revealed small left  PE.  Full report to follow.  Her EKG reveals normal sinus rhythm with a  rate of 99.  She has some nonspecific T-wave flattening.  No ST-segment  elevation or depression.   ASSESSMENT AND PLAN:  1. Subacute pulmonary embolism in 60 year old.  The plan is to admit      her to telemetry bed.  She has already received the first dose of      Lovenox here in the emergency department.  We will start Coumadin      tomorrow.  We will provide extensive Coumadin education and      training in how to use Lovenox as an outpatient.  I did answer all      of the patient's questions.  There is no clear cause for this      patient's PE.  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  She has not had any travel.  We will go ahead  and      check lower extremity Dopplers.  She is already up-to-date with her      outpatient cancer screening.  She has had a mammogram within the      last year.  She has had a colonoscopy within the last two years.      She has a history of a total hysterectomy.  There is no clear      evidence of malignancy in this patient.  She does, however, report      20 pounds of unexplained weight loss over the last six months.  She      does have a gastroenterologist, Dr. Loreta Ave, whom she follows with.      Prior to discharge, we will also check her room air ABG to be sure      that she is oxygenating well, but otherwise this patient may be a      good candidate to take Lovenox at home until her Coumadin is      therapeutic.  She will, of course, need close followup for the      Coumadin.  2. Hypercholesterolemia.  We will continue her Crestor.  3. Hypertension.  We will continue her Maxzide.  4. Mild hypokalemia.  We will replace this.      Thomasenia Bottoms, MD  Electronically Signed     CVC/MEDQ  D:  08/12/2008  T:  08/13/2008  Job:  [id number]   cc:   Candyce Churn. Allyne Gee, M.D.  Fax: [phone]

## 2011-03-06 NOTE — H&P (Signed)
[name], [name]               ACCOUNT NO.:  [id number]   MEDICAL RECORD NO.:  [id number]          PATIENT TYPE:  INP   LOCATION:  0101                         FACILITY:  [facility]   PHYSICIAN:  Della Goo, M.D. DATE OF BIRTH:  11/27/1950   DATE OF ADMISSION:  05/03/2009  DATE OF DISCHARGE:                              HISTORY & PHYSICAL   DATE OF ADMISSION:  May 03, 2009.   CHIEF COMPLAINT:  Abdominal pain, nausea and vomiting.   HISTORY OF PRESENT ILLNESS:  This is a 60 year old female who presents  to the emergency department with complaints of right upper and lower  quadrant abdominal pain which awakened her from sleep at 4:30 a.m. in  the morning on the previous a.m..  The patient reports having nausea and  vomiting.  Reports vomiting, watery, yellowish fluid.  She denies  vomiting any blood.  She denies having any diarrhea.  She does report  having a subjective low grade temperature but denies having any chills.  At the worst, the patient reports the pain had been a 10/10.   PAST MEDICAL HISTORY:  Significant for previous pulmonary embolism in  October 2009, treated with Coumadin therapy and Lovenox injections,  hypertension, hyperlipidemia, gastroesophageal reflux disease.   PAST SURGICAL HISTORY:  History of a recent left hip replacement, a  previous right hip replacement, a cholecystectomy, hysterectomy, and  bilateral breast reduction.   Her medications include Maxzide 37.5/25 one p.o. daily, Crestor 10 mg  one p.o. daily, Ambien 10 mg one p.o. q.h.s. p.r.n., Nexium 40 mg one  p.o. daily.   ALLERGIES:  IBUPROFEN, MORPHINE, PENICILLIN, VICODIN.   SOCIAL HISTORY:  The patient is a smoker and smokes 5 to 6 cigarettes a  week.  She also reports drinking three 3  to 4 mixed drinks weekly.  She  denies any illicit drug usage.   FAMILY HISTORY:  Noncontributory.   REVIEW OF SYSTEMS:  Pertinents mentioned above.   PHYSICAL EXAMINATION:  FINDINGS:  This is a  60 year old well-nourished,  well-developed female in discomfort but no acute distress.  VITAL SIGNS:  Temperature 90, 8.7 blood pressure 134/85, heart rate 90-  104, respirations 20, O2 sats 98%.  HEENT:  Normocephalic, atraumatic.  Pupils equally round reactive to  light.  Extraocular movements are intact.  Funduscopic benign.  There is  no scleral icterus.  Nares are patent bilaterally.  Oropharynx is clear.  NECK:  Supple.  Full range of motion.  No thyromegaly, adenopathy,  jugular venous distention.  CARDIOVASCULAR:  Regular rate and rhythm.  No murmurs, gallops or rubs.  LUNGS: Clear to auscultation bilaterally.  ABDOMEN:  Positive bowel sounds, soft, nontender, nondistended.  EXTREMITIES: Without cyanosis, clubbing or edema.  NEUROLOGIC:  Patient is alert and oriented x3.  Cranial nerves are  intact.  Motor and sensory function also intact.   LABORATORY STUDIES:  White blood cell count 10.6, hemoglobin 14.2,  hematocrit 42.8, platelets 275, neutrophils 70% lymphocytes 24%.  Sodium  141, potassium 3.6, chloride 105, carbon dioxide 26, BUN 15, creatinine  0.83 and glucose 94.   X-RAY:  CT scan of  the abdomen reveals mild fatty infiltration of the  liver without focal lesions, status post cholecystectomy.  No renal,  adrenals, or splenic lesion seen.  A hypodense area in the uncinate  process is seen, which may be artifact, but needs further delineation.  There are no signs of appendicitis seen on the CT scan.   ASSESSMENT:  A 60 year old female being admitted with  1. Abdominal pain.  2. Nausea and vomiting.  3. Hypertension.  4. Hyperlipidemia.  5. Leukocytosis.   PLAN:  The patient will be admitted for observation status.  Medications  have been ordered to help control her pain and nausea symptoms.  The  patient will be placed on clear liquids for now and IV fluids.  Her  regular medications have been ordered as well.  Further workup will  ensue pending results of the  patient's __________.      Della Goo, M.D.  Electronically Signed     HJ/MEDQ  D:  05/04/2009  T:  05/04/2009  Job:  [id number]

## 2011-03-06 NOTE — Discharge Summary (Signed)
NAMELOVELY, KERINS               ACCOUNT NO.:  [id number]   MEDICAL RECORD NO.:  [id number]          PATIENT TYPE:  INP   LOCATION:  5022                         FACILITY:  [facility]   PHYSICIAN:  Feliberto Gottron. Turner Daniels, M.D.   DATE OF BIRTH:  Dec 15, 1950   DATE OF ADMISSION:  02/16/2009  DATE OF DISCHARGE:  02/21/2009                               DISCHARGE SUMMARY   CHIEF COMPLAINT:  Left hip pain.   HISTORY OF PRESENT ILLNESS:  This is a 60 year old lady who complains of  severe unremitting pain in the left hip despite conservative treatment  with anti-inflammatories and narcotic pain medication.  She has a  history of a successful hip replacement on the contralateral side and  desires a surgical intervention at this time.  All risks and benefits of  surgery were discussed with the patient.   PAST MEDICAL HISTORY:  Significant for a pulmonary embolism in 2009 and  hypertension.   PAST SURGICAL HISTORY:  She had:  1. Left total hip arthroplasty.  2. Cholecystectomy.  3. Back surgery.   SOCIAL HISTORY:  She denies use of tobacco and drinks occasional  alcohol.  She has no known drug allergies.   CURRENT MEDICATIONS:  1. Crestor 10 mg 1 p.o. daily.  2. Maxzide 25 mg 1 p.o. daily  3. Ambien 10 mg 1 p.o. nightly p.r.n. sleep.  4. Nexium 40 mg 1 p.o. daily.  5. Coumadin 10 mg Monday, Tuesday, Thursday, Friday and 5 mg on the      remaining days.   FAMILY HISTORY:  Significant for hypertension and diabetes.   PHYSICAL EXAMINATION:  Gross examination of the left hip demonstrates  the patient to have tenderness with internal rotation.  She is  neurovascularly intact.  X-rays demonstrate bone-on-bone degenerative  joint disease of the left hip.   PREOPERATIVE LABORATORIES:  White blood cells 6.4, red blood cells 4.43,  hemoglobin 12.6, hematocrit 38.6, platelets 204.  Sodium 141, potassium  3.9, chloride 107.  Glucose 100, BUN 18, creatinine 0.88.  Urinalysis  was within normal  limits.   HOSPITAL COURSE:  Ms. Harlan was admitted to [facility] on February 16, 2009, when she underwent left total hip arthroplasty, the procedure was  performed by Dr. Gean Birchwood and the patient tolerated it well.  A  perioperative Foley catheter was placed and the patient was transferred  to the orthopedic floor and placed on Lovenox and Coumadin for DVT  prophylaxis.  On the first postoperative day the patient was awake and  alert, she complained of nausea but no vomiting, hemoglobin was 10.7 and  she was evaluated by physical therapy.  On the second postoperative day  the patient reported improvement in her nausea and vomiting and was  taking p.o. intake well, she was passing flatus but not stool, she  ambulated 96 feet with physical therapy.  Hemoglobin was 9.1.  Surgical  dressing was clean and the incision was benign.  Later that day the  patient began experiencing episodes of tachycardia.  Her cardiologist  was consulted.  Cardiac enzymes were within normal limits and the  tachycardia was felt to be secondary to postop anemia.  The patient on  the 3rd postoperative day was doing much better, hemoglobin was 8.3 so  she was transfused with 2 units of packed red blood cells.  She  continued to work with physical therapy.  On the 4th postoperative day  the patient's hemoglobin had increased to 10.5, she reported improvement  in her fatigue and continued to progress well with physical therapy.  On  the 5th postoperative day the patient was eating well and ambulating  fairly well with therapy, she was discharged to a skilled nursing  facility for a short-term stay in rehab secondary to a lack of help at  home.   DISPOSITION:  The patient was discharged to [facility] for skilled  nursing rehab on Feb 21, 2009.  She will be weightbearing as tolerated  and will return to the clinic to see Dr. Turner Daniels in 7-10 days for suture  removal and x-rays.   DISCHARGE MEDICINES:  Will be as  per the HMR, with the addition of:  1. Percocet 5 mg 1-2 tablets p.o. q.4 h p.r.n. pain.  2. Coumadin to take as directed with a target INR of 1.52.   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      Shirl Harris, PA      Feliberto Gottron. Turner Daniels, M.D.  Electronically Signed    JW/MEDQ  D:  02/21/2009  T:  02/21/2009  Job:  [id number]

## 2011-03-06 NOTE — H&P (Signed)
[name], [name]               ACCOUNT NO.:  [id number]   MEDICAL RECORD NO.:  [id number]          PATIENT TYPE:  INP   LOCATION:  1824                         FACILITY:  [facility]   PHYSICIAN:  Vania Rea, M.D. DATE OF BIRTH:  07/23/1951   DATE OF ADMISSION:  08/23/2008  DATE OF DISCHARGE:                              HISTORY & PHYSICAL   PRIMARY CARE PHYSICIAN:  Robyn N. Allyne Gee, M.D.   CHIEF COMPLAINT:  Chest pain, getting worse since yesterday.   HISTORY OF PRESENT ILLNESS:  This is a 60 year old African American lady  who was admitted to this facility about one month ago for a small left-  sided pulmonary embolus found on a CT scan done at [facility].  The  patient also had Doppler exam done here, which showed no evidence of  DVT.  Patient was kept two days and sent home on Lovenox and Coumadin.  The patient initially presented for evaluation over one week ago because  of chest pain, and at that time, a pulmonary embolus was found.  The  patient followed up with her primary care physician yesterday and was  feeling okay but later in the afternoon, the patient developed sudden  onset of a sharp left-sided chest pain radiating up into the neck,  associated with diaphoresis, nausea, and sweating.  Patient also says  the pain is aggravated by breathing.  She has no fever, cough, or cold.   Medical records show the patient had a cardiac catheterization in 2007  and at that time had no evidence of coronary artery disease.  The  patient denies GERD but is taking Nexium 40 mg daily.   PAST MEDICAL HISTORY:  1. Hypertension.  2. Hyperlipidemia.  3. Recent diagnosis of pulmonary embolus.   MEDICATIONS:  1. Crestor 5 mg daily.  2. Maxzide 1 tablet daily.  3. Coumadin 15 mg Tuesdays and Thursdays, 10 mg other days.  Recent      dose increase.  4. Lovenox 110 mg daily.   SOCIAL HISTORY:  Patient denies tobacco abuse.  Said she drinks about 2  cocktails per day but has not  had anything to drink for the past 2 days.  Denies illicit drug use.  Works as a Firefighter.   FAMILY HISTORY:  Negative for coronary artery disease or strokes.   REVIEW OF SYSTEMS:  Other than noted above, a 10-point review of systems  is unremarkable.  She denies anxious or insomnia.   PHYSICAL EXAMINATION:  A pleasant, middle-aged African American lady  lying flat in bed in no acute distress.  VITALS:  Temperature 97.9, pulse 83, respirations 20, blood pressure  110/72.  She is saturating at 94% on room air.  Pupils are round and  equal.  Mucous membranes are pink and anicteric.  She is not dehydrated.  She has no cervical lymphadenopathy or thyromegaly.  CHEST:  Clear to auscultation bilaterally.  She does have a lower chest  wall reproducible tenderness.  ABDOMEN:  Obese, soft and nontender.  There are no masses.  EXTREMITIES:  Without edema.  She has 3+ pulses bilaterally.  CENTRAL NERVOUS SYSTEM:  Cranial nerves II-XII are grossly intact.  She  has no focal neurological deficits.   LABS:  Cardiac enzymes are completely normal with undetectable  troponins.  A CBC is completely normal.  Her serum chemistry is  significant only for an elevated BUN and creatinine ratio of 27/0.8.  Her PT is 19.9.  Her INR is 1.6.  Her B-type natriuretic peptide is 11.   A CT angiogram of her chest was done here tonight, and it shows no  evidence of pulmonary embolus.   An EKG shows sinus rhythm, unchanged from the EKG done a week ago.   ASSESSMENT:  Atypical chest pain.  Recent diagnosis of Acute Pulmonary Embolus  Hypertension, controlled.  History of hyperlipidemia.   PLAN:  Since this chest pain cannot be clearly explained by pleurisy, we  will get a cardiac evaluation, do serial cardiac enzymes.  The patient  may be a candidate for a stress test.  In the meantime, we will treat  her pain with oxycodone.  Will avoid NSAIDs since she is anticoagulated.  Other plans as per  orders.      Vania Rea, M.D.  Electronically Signed     LC/MEDQ  D:  08/24/2008  T:  08/24/2008  Job:  [id number]   cc:   Candyce Churn. Allyne Gee, M.D.

## 2011-03-06 NOTE — Discharge Summary (Signed)
[name], [name]               ACCOUNT NO.:  [id number]   MEDICAL RECORD NO.:  [id number]          PATIENT TYPE:  OBV   LOCATION:  1439                         FACILITY:  [facility]   PHYSICIAN:  Eduard Clos, MDDATE OF BIRTH:  [birth year]   DATE OF ADMISSION:  10/27/2008  DATE OF DISCHARGE:  10/30/2008                               DISCHARGE SUMMARY   FINAL DIAGNOSES:  1. Atypical chest pain.  2. History of pulmonary embolism.  3. Hypertension.  4. Hyperlipidemia.   COURSE IN THE HOSPITAL:  A 60 year old female with known history of  pulmonary embolism on Coumadin, hypertension, and hyperlipidemia  presented with chest pain, which was more of pleuritic-type, left-sided,  present on deep inspiration.  The patient on admission was found to have  a subtherapeutic INR, and a CT angio of the chest was done, which did  not show any acute findings and no pulmonary embolism.  X-rays also did  not show any acute findings.  The patient was admitted for further  observation for her chest pain.  Cardiac enzymes were followed, which  were negative.  EKG did show some T-wave changes.  The patient was  placed on Coumadin and Lovenox because the INR was subtherapeutic.  Due  to pain being persistent, I did call Cardiology [facility], and  Dr. Lynnea Ferrier did see the patient and felt the patient's chest pain was  atypical and they would be following as an outpatient for possible  stress test for which the patient agreed to do so.  At the same, the  patient's chest pain is largely resolved, the patient's INR is being  controlled and subtherapeutic at 1.7, and the patient will be discharged  home on Lovenox and Coumadin.  The patient will be given teaching how to  give a Lovenox injection before discharge, and the patient was advised  that she will definitely need to check her PT-INR on November 01, 2008,  with her primary care physician, Dr. Allyne Gee, which she understood and  agreed to do  so.  She stated that she does follow her PT-INR regularly.  At time of discharge, the patient is hemodynamically stable.   PROCEDURES DURING THIS ADMISSION:  1. On October 27, 2008, chest x-ray, nothing acute.  2. On October 27, 2008, CT angio chest, no embolism identified,      bibasilar rales, and subsegmental atelectasis.   DISCHARGE MEDICATIONS:  1. Crestor 5 mg p.o. daily.  2. Maxzide 25 mg p.o. daily.  3. Nexium 40 mg p.o. daily.  4. Coumadin 10 mg p.o. daily at 5 p.m., to recheck a PT-INR on November 01, 2008, and further doses of Coumadin adjusted accordingly.  5. Lovenox 75 mg subcutaneous q.12 hourly and once she checks the PT-      INR on November 01, 2008, with her primary care physician, Dr.      Allyne Gee, if INR is more than 2 then the patient has been instructed      to discontinue the Lovenox injection.   PLAN:  Patient advised to follow with Dr. Allyne Gee, her  primary care  physician, on November 01, 2008, recheck of PT-INR, and if PT-INR shows  INR more than 2 to discontinue Lovenox injection and Coumadin dose  adjusted  accordingly.  The patient is to be on a cardiac healthy diet  and to follow with Dr. Lynnea Ferrier, Cardiologist, as scheduled for further  workup as an outpatient.      Eduard Clos, MD  Electronically Signed     ANK/MEDQ  D:  10/30/2008  T:  10/30/2008  Job:  [id number]

## 2011-03-06 NOTE — H&P (Signed)
[name], [name]               ACCOUNT NO.:  [id number]   MEDICAL RECORD NO.:  [id number]          PATIENT TYPE:  INP   LOCATION:  5025                         FACILITY:  [facility]   PHYSICIAN:  Anselmo Rod, M.D.  DATE OF BIRTH:  [birth year]   DATE OF ADMISSION:  06/21/2007  DATE OF DISCHARGE:                              HISTORY & PHYSICAL   ADMISSION HISTORY AND PHYSICAL:  Patient's admission is significant for  diarrhea with nausea and abdominal pain.   HISTORY OF PRESENT ILLNESS:  [name] is a pleasant 60 year old  African American female who I have known since 2003.  The patient has a  history of hypertension, hypertriglyceridemia and sigmoid  diverticulosis.  She was in her usual state of health until May 13, 2007, when she returned from the [place] after a 5-day trip  with fever, chills, diarrhea and nausea and abdominal pain.  The  symptoms lasted for 4 to 5 days that resolved on their own about 3 weeks  ago.  Her symptoms recurred.  She presented to my office on June 19, 2007, with the same complaints.  Basic labs were done.  No acute  abnormalities were found.  She was started on Cipro and Flagyl  empirically and given Phenergan for nausea.  She did not have any  improvement in her symptoms for over 24 to 48 hours, and a CT scan was  done yesterday that revealed no acute findings except for sigmoid  diverticulosis and some thickening of the right colon.  Patient denies a  history associated fever, chills or rigors.  She is averaging anywhere  between 8 to 10 loose bowel movements per day.  There is no history of  melena or hematochezia.  She denies recent antibiotic use prior to  starting of treatment with Cipro and Flagyl.  Appetite has decreased.  There are no sick contacts.   PAST MEDICAL HISTORY:  1. Hypertension.  2. Hyperlipidemia.  3. Sigmoid diverticulosis.  4. History of adenomatous polyp removal in the past.   PAST SURGICAL  HISTORY:  1. 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 Havery Moros.  2. Laparoscopic cholecystectomy.  3. History of total abdominal hysterectomy.  4. Status post bilateral breast reduction 12 years ago.   MEDICATIONS:  At home are:  1. Crestor.  2. Maxzide.  3. Vitamin D supplements twice a week.   SOCIAL HISTORY:  She is married and lives with her husband in  [place].  She drinks alcohol occasionally.  She denies use of tobacco  and street drugs.  She has 2 grown children.  She works for a Museum/gallery curator.   FAMILY HISTORY:  Noncontributory.   GENERAL PHYSICAL EXAMINATION:  Reveals a pleasant middle-aged African  American female in obvious pain.  Temperature 97.6, blood pressure  122/80, pulse 80 per minute, respiratory rate 18.  HEENT EXAMINATION:  Reveals atraumatic, normocephalic head.  Patient's  oropharyngeal mucosa without exudate.  NECK:  Supple.  No JVD, thyromegaly, lymphadenopathy.  CHEST:  Clear to auscultation.  S1, S2 regular.  ABDOMEN:  Soft, nondistended with laparoscopic scar present in the right  upper quadrant from a previous cholecystectomy.  Midline scar present  below the umbilicus from previous hysterectomy.  Her epigastric and  periumbilical tenderness on palpation with minimum guarding.  No rebound  or rigidity; there is no  hepatosplenomegaly.  RECTAL EXAMINATION:  Deferred as the examination was done on August 28,  to be guaiac negative stools.   LABORATORY:  Results are pending.   ASSESSMENT AND PLAN:  1. Severe diarrhea with nausea and abdominal pain.  Rule out      infectious colitis.  Clostridium difficile toxin assay along with      Escherichia coli in the stool is to be ruled out.  Stool for ova      and parasites have been ordered.  2. Abdominal pain and nausea.  Patient will be given Dilaudid and      Zofran for symptomatic relief along with liberal intravenous      fluids.  She  will be maintained  on clear liquids and further      recommendations and followup  I will hold off on using her Maxzide      and starting her Crestor at this time.      Anselmo Rod, M.D.  Electronically Signed     JNM/MEDQ  D:  06/21/2007  T:  06/22/2007  Job:  [id number]   cc:   Candyce Churn. Allyne Gee, M.D.

## 2011-03-09 NOTE — Op Note (Signed)
[name], [name]               ACCOUNT NO.:  [id number]   MEDICAL RECORD NO.:  [id number]          PATIENT TYPE:  INP   LOCATION:  X001                         FACILITY:  [facility]   PHYSICIAN:  Madlyn Frankel. Charlann Boxer, M.D.  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   POSTOPERATIVE DIAGNOSES:  Right hip avascular necrosis greater than the  left.   FINDINGS:  The patient was noted to have fragmentation of the femoral head  with multiple loose bodies of cartilage fragments within the articular  surface.   The acetabular surface appeared to be intact.   PROCEDURE:  Right total hip replacement.   COMPONENTS USED:  DePuy hip system with a size 50 ASR cup, a size 10  standard Trilock stem with a 45 ASR ball and a plus 8 adapter.   SURGEON:  Madlyn Frankel. Charlann Boxer, M.D.   ASSISTANT:  Cherly Beach, PA-C.   ANESTHESIA:  General.   ESTIMATED BLOOD LOSS:  250.   DRAINS:  Drains x1.   COMPLICATIONS:  None.   INDICATIONS FOR PROCEDURE:  Ms. Stare is a 60 year old female who presented  to the office with bilateral hip pain, right worse than left.  Radiographically, the joint space appears to be maintained.   Given the persistent symptoms of failure of conservative measures, MRI was  ordered to rule out intraarticular pathology and AVN. This revealed AVN in  both hips more acutely on the right then the left. There was no comment  about fragmentation at the time. Based on persistent symptoms and failure to  respond to conservative measures, we discussed treatment options including  nonoperative measures versus operative intervention. Based on persistent  symptoms, she wished to proceed with right total hip replacement. The risks  and benefits were reviewed including DVT, nerve damage, infection, component  failure, and  dislocation.   DESCRIPTION OF PROCEDURE:  The patient was brought to the operative theater.  Once he had adequate anesthesia and preoperative antibiotics, 600 mg of IV  clindamycin were administered. The patient was positioned in the left  lateral decubitus position with the right side up. The right lower extremity  was then prepped and draped in a sterile fashion. A lateral based incision  was made through a posterior approach to the hip. Short external rotators  were taken down seperate from the posterior capsule. The posterior capsule  was utilized for protection against the sciatic nerve as well as saved for  repair.   The hip was dislocated, neck osteotomy was made based on anatomic landmarks  and preoperative templating. The affected extremity was compared to down leg  preoperatively for utilization following placement of the components.   Following neck osteotomy, cup size was confirmed. Attention was first  directed to the femur, femoral exposure obtained in routine fashion with  retractors. The femur was then prepared for a protocol from theTrilock hip  stem with initial broaching up to an 8.8 obtaining approximately  20 degrees  of anteversion in the femur where she had a relatively neutral neck. At this  point following this, attention was directed to the acetabulum. The femoral  broach was removed and acetabular exposure was obtained in routine fashion  including labrectomy. Reaming commenced with a 45 reamer and was carried up  to a 49 reamer. The patient was noted to have an intact anterior cruciate  columnbut the posterior wall was fairly thin. She also seemed to have a  relatively shallow acetabulum. Boney preparation was excellent with good  coverage. The final 50 ASR cup was then impacted to the level where the  reamers had stopped with a very secure fit. The cup position was about 35 to  40 degrees of abduction and then about 20 degrees of forward flexion.   Following  this, attention was redirected back to the femur initially from  the trial with a size 8.8 broach with a neck cut. I did mill the neck with a  calcar miller. Following this, trial reduction was carried out at this neck  cut. I additionally had done this with a plus 2 adapter. The hip felt very  stable, however, it felt short compared to the down load preoperatively.  There was a few millimeters of shuck even in extension. Given this, I went  ahead and took the size 8.8 broach out and went to a size 10. This was  impacted to the level of the neck cut. There was critical bone exposed at  the medial neck at this point. Based on the appearance of her neck with the  size 10 broach in, I chose the size 10 Trilock standard stem. This was  impacted to the level of the neck cut and a little bit below this with  excellent purchase initially.   I then trialed first with a +5 given the fact that there was some subsidence  at the neck cut. I also the plans to potentially go to a plus 8 as needed.  Following the trial reduction with a plus 5, I still felt that she had a  little bit of laxity even in extension with shuck and for that reason I  trialed a plus 8. The plus 8 provided approximately 1 to 2 mm of shuck, leg  lengths appeared to be stable to the down leg and the hip stability was  excellent with flexion and internal rotation all the way to 80 degrees,  stable in the sleep position with adduction of 30 degrees internal rotation  as well as extension and external rotation. A combined anteversion of the  system was 45-50 degrees. Given this, the final 45 ASR ball with a plus 8  adapter was brought into the field. This was impacted on a clean and dry  trunnion and hip reduced. Again compared to the down leg compared to how it  was prior to any neck cut, the leg lengths appeared to be equal.  The wound was copiously irrigated throughout the case, the Hemovacdrain was  placed deep. The posterior  capsule was not able to be reapproximated to the  superior leaflet based on exposure and debridement but I was able to  reapproximate the short external rotator mass to the gluteus medius tendon  with #1 Ethibond. The remainder of the  wound was closed in layers with #1 Ethibond on the iliotibial band and #1  Vicryl on the gluteus fascia. The wound was irrigated again and then closed  in layers with a  4-0 Monocryl in the skin. The patient was then extubated  and brought to the recovery room, a sterile dressing applied to the hip.  There were no apparent complications.      Madlyn Frankel Charlann Boxer, M.D.  Electronically Signed     MDO/MEDQ  D:  11/08/2005  T:  11/08/2005  Job:  [id number]

## 2011-03-09 NOTE — Cardiovascular Report (Signed)
NAME:  [name], [name]                         ACCOUNT NO.:  [id number]   MEDICAL RECORD NO.:  [id number]                   PATIENT TYPE:  INP   LOCATION:  2001                                 FACILITY:  [facility]   PHYSICIAN:  Cristy Hilts. Jacinto Halim, M.D.                  DATE OF BIRTH:  04/09/1951   DATE OF PROCEDURE:  DATE OF DISCHARGE:  06/08/2002                              CARDIAC CATHETERIZATION   PATIENT LOCATION:  Room #2001.   PROCEDURE PERFORMED:  1. Left heart catheterization.  2. Left ventriculography.  3. Selective right and left coronary arteriography.  4. Ascending aortogram.  5. Selective right and left renal arteriography.  6. Right femoral angiography with posterior right femoral artery access with     Perclose.   INDICATION:  The patient is a 60 year old female with a history of  hypertension, smoking who was admitted to the hospital with typical chest  pain. She had recently undergone cardiac stress testing which was negative.  Given her recurrent chest pain evaluation, cardiac catheterization is being  performed to evaluate for suspected coronary artery disease.   HEMODYNAMIC DATA:  The left ventricular pressures were 108 with an end-  diastolic pressure of 17 mmHg. The aortic pressures were 108/69 with a mean  of 83 mmHg.  There was no pressure gradient across the aortic valve.   LEFT VENTRICULOGRAM:  The left ventricular systolic function was normal.  Ejection fraction 60%. No mitral regurgitation.   Right coronary artery:  The right coronary artery is a large caliber vessel.  It is a dominant vessel. It is normal.   Left main coronary artery:  The left main coronary artery is a large caliber  vessel. It is normal.   Left circumflex:  Left circumflex is large caliber vessel. It gives origin  to a AV groove circumflex and continues as a large obtuse marginal #1.  It  is normal.   Left anterior descending artery:  Left anterior descending artery is large  caliber vessel.  It gives origin to two moderate sized diagonals. Both the  diagonals and left anterior descending are normal.   AORTOGRAPHY:  The aortic root injection in the LAO projection revealed  normal aorta with no evidence of aortic dissection or aortic regurgitation.  There is three aortic cusps.   SELECTIVE RIGHT AND LEFT RENAL ARTERIOGRAPHY:  Renal arteriography revealed  normal renal arteries.  There was one renal artery on either side.   IMPRESSION:  1. Normal left ventricular systolic function, ejection fraction 60%.  2. Normal coronary arteries, right dominant circulation.  3. Normal aortic root.  4. Normal renal arteries.   RECOMMENDATIONS:  This chest pain is probably of noncardiac etiology. If she  continues to have recurrent chest pain, evaluation for noncardiac cause of  chest pain is indicated.   TECHNIQUE OF PROCEDURE:  Under the usual sterile precautions, using a 6  French right femoral  artery access, a 6 [place] multipurpose B2 catheter was  advanced to into the ascending aorta over 0.035 inch J wire. The catheter  was gently advanced to the left ventricle and left ventricular pressures  were monitored. Hand contrast injection of the left ventricle was performed,  both in the LAO and RAO projections. The catheter was flushed with saline  and pulled back in the ascending aorta and pressure gradient across the  aortic valve was monitored.  The right coronary artery was selectively  engaged and angiography was performed. In a similar fashion, the left main  coronary artery was selectively engaged and angiography was performed. In a  similar fashion, the left main coronary artery was engaged and angiography  was performed.  Then the catheter was pulled back into the aortic root and  aortic root injection in the LAO projection was performed with hand contrast  injection. The catheter was gently pulled back into the abdominal aorta and  right and left selective  renal arteriography was performed.  Then the  catheter was pulled out of the body. Right femoral angiography was performed  through the arterial access sheath and arterial access was closed with  Perclose. Excellent hemostasis obtained. The patient was transferred to  recovery in stable condition.                                                 Cristy Hilts. Jacinto Halim, M.D.    Pilar Plate  D:  06/08/2002  T:  06/10/2002  Job:  [id number]   cc:   Madaline Savage, M.D.

## 2011-03-09 NOTE — Cardiovascular Report (Signed)
[name], [name]               ACCOUNT NO.:  [id number]   MEDICAL RECORD NO.:  [id number]          PATIENT TYPE:  INP   LOCATION:  5526                         FACILITY:  [facility]   PHYSICIAN:  Darlin Priestly, MD  DATE OF BIRTH:  1950/10/30   DATE OF PROCEDURE:  09/03/2006  DATE OF DISCHARGE:                              CARDIAC CATHETERIZATION   DATE OF PROCEDURE:  09/03/2006.   PROCEDURE:  1. Left heart catheterization.  2. Coronary angiography.  3. Left ventriculogram.   SURGEON:  Dr. Jenne Campus.   COMPLICATIONS:  None.   INDICATIONS:  Ms. Salvas is a 60 year old female with a history of  osteoarthritis, hypertension, history of hip replacement secondary to  avascular necrosis who was admitted through the ER on 09/02/2006 with  substernal chest pain.  She did have a presentation to [facility]  three weeks ago with chest pain with a negative chest CT at that time.  She  was readmitted on 11/12 with chest pain with recurrent pain throughout the  night.  She did have a catheterization in 2003 revealing no significant CAD  with normal LV function.  Because of her ongoing symptoms, she is now here  for a repeat catheterization to rule out CAD.   DESCRIPTION OF PROCEDURE:  After given informed consent, the patient was  brought to the cardiac cath lab where right groin was shaved, prepped, and  draped in sterile fashion.  ECG monitoring was established.  Using a  modified Seldinger technique, a 6 French arterial sheath inserted in right  femoral artery.  A 6 French diagnostic catheter was used to perform  diagnostic angiography.   Left main is large, also with no significant disease.   LAD is a large vessel which courses the vectors of two diagonal branches.  The LAD has TIMI 2 to 3 flow to the mid and distal segments, but no  significant disease.   First and second diagonals are small to median size vessels with no  significant disease.   Left circumflex is a  large vessel which coursed the AV groove and gives rise  to two obtuse marginal branches.  The AV groove and circumflex have no  significant disease.   First OM is a large vessel which bifurcates in the mid segment with no  significant disease.   The second OM is a small vessel which bifurcates distally with no  significant disease.   The right coronary artery is a large vessel which is dominant, gives rise to  both PDA and posterolateral branch.  There is again noted to be TIMI 2 to 3  flow to the distal portion of the RCA, but no significant stenosis.   The PD and posterolateral branch are medium sized vessels with no  significant disease.   Left ventriculogram reveals a preserved EF of 50%.   Hemodynamic:  Systemic arterial pressure 84/54, LV systolic pressure 85/1,  LVEDP of 6.   CONCLUSION:  1. Noncritical coronary artery disease.  2. Low normal EF.      Darlin Priestly, MD  Electronically Signed  RHM/MEDQ  D:  09/03/2006  T:  09/04/2006  Job:  [id number]
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
## 2011-03-09 NOTE — Discharge Summary (Signed)
[name], [name]               ACCOUNT NO.:  [id number]   MEDICAL RECORD NO.:  [id number]          PATIENT TYPE:  INP   LOCATION:  5526                         FACILITY:  [facility]   PHYSICIAN:  Beckey Rutter, MD  DATE OF BIRTH:  09-04-1951   DATE OF ADMISSION:  09/02/2006  DATE OF DISCHARGE:  09/05/2006                               DISCHARGE SUMMARY   DISCHARGE DIAGNOSES:  1. Hypertriglyceridemia.  2. Significant right hip replacement.  3. History of avascular necrosis of the forehead.  4. Status post cholecystectomy.  5. History of breast reduction.  6. Hysterectomy secondary to bleeding.   DISCHARGE MEDICATIONS:  1. Aspirin 81 mg p.o. daily.  2. Metoprolol 25 mg p.o. b.i.d.  3. Protonix 40 mg p.o. daily.  4. Tricor 48 mg p.o. every day.  5. Xanax 0.25 mg p.o. t.i.d. p.r.n. for anxiety.   PROCEDURES DURING HOSPITALIZATION:  1  1. A stress test resulting in cardiac catheterization done on September 03, 2006 including left heart catheterization.  2. Coronary angiography.  3. Left ventriculogram.   At the conclusion of that cardiac cath, the impression is:  1. Non-critical coronary artery disease.  2. Low normal ejection fraction.   The cardiac catheterization was done by Dr. Jenne Campus.   HOSPITAL COURSE:  During the hospital course the patient underwent the  cardiac catheterization for her chest pain with the results as mentioned  above.  She also was found to have hypertriglyceridemia with a number  greater than 600.  The patient was started on Tricor for that.  The  patient is stable for discharge now, to be discharged on  the medications as above.  For her blood pressure metoprolol 25 mg  b.i.d.  For her anxiety, Xanax 0.25 mg p.o. t.i.d.  She will also be  discharged on aspirin 81 mg.  The patient will followup with her  physician for further hypertriglyceridemia management.      Beckey Rutter, MD  Electronically Signed     EME/MEDQ  D:   09/05/2006  T:  09/06/2006  Job:  [id number]

## 2011-03-09 NOTE — H&P (Signed)
[name], [name]               ACCOUNT NO.:  [id number]   MEDICAL RECORD NO.:  [id number]          PATIENT TYPE:  OBV   LOCATION:  5733                         FACILITY:  [facility]   PHYSICIAN:  Jordan Hawks. Elnoria Howard, MD    DATE OF BIRTH:  1951-03-29   DATE OF ADMISSION:  07/04/2006  DATE OF DISCHARGE:                                HISTORY & PHYSICAL   PRIMARY CARE PHYSICIAN:  Robyn N. Allyne Gee, M.D.   ORTHOPEDIST:  Madlyn Frankel. Charlann Boxer, M.D.   HISTORY OF PRESENT ILLNESS:  This is a 60 year old female with past medical  history of  bilateral avascular hip necrosis, hypertension and seasonal  allergies who is admitted to the hospital with worsening abdominal pain,  diarrhea and nausea and vomiting.  The patient previously saw Dr. Loreta Ave in  the office this past Tuesday and a CT scan revealed that there was right-  sided colitis.  She was started on Cipro and Flagyl and also provided with  Vicodin which initially improved her symptoms, however, during this past  evening she says that the diarrhea worsened and she continued to have a  significant amount of abdominal pain.  She was subsequently admitted for in-  hospital treatment of these symptoms.  The patient reports having diarrhea  before her colonoscopy which was performed on June 14, 2006, however,  after colonoscopy, all of her symptoms subsided.  She did not have any  further issues until this past Friday which continued to progress over the  weekend.  During the colonoscopy, she was noted to have one small sigmoid  polyp.  The pathology is not available at this time, however, no other  suspicious lesions were identified during her examination.  The patient also  reports having a significant amount of nausea and vomiting as well as a  fever to 101.  At this time, she is admitted for further evaluation and  treatment.   ALLERGIES:  PENICILLIN which results in shortness of breath and swallowing.   PAST MEDICAL HISTORY:  As stated  above.   PAST SURGICAL HISTORY:  As stated above.   FAMILY HISTORY:  Noncontributory.   SOCIAL HISTORY:  She smokes on social occasions as well as drinks alcohol on  social occasions.  No prior history of illicit drug use.   REVIEW OF SYSTEMS:  As stated above in the history of present illness.  All  other review of systems are negative per the 11-point review of systems.   PHYSICAL EXAMINATION:  GENERAL APPEARANCE:  Patient is mildly uncomfortable.  VITAL SIGNS:  Pending at this time.  HEENT:  Normocephalic and atraumatic.  Extraocular muscles intact.  Pupils  are equal, round and reactive to light.  NECK:  Supple.  No lymphadenopathy.  LUNGS:  Clear to auscultation bilaterally.  CARDIOVASCULAR:  Regular rate and rhythm without murmurs, rubs, or gallops.  ABDOMEN:  Obese, soft, tender on the right side, no rebound or rigidity.  EXTREMITIES:  No clubbing, cyanosis, or edema.   LABORATORY DATA:  None available at this time.   IMPRESSION:  1. Colitis.  2. Abdominal pain secondary to #  1.  3. Diarrhea secondary to #1.  4. Hypertension.  5. Seasonal allergies.   After evaluation of the patient, it is apparent that she does have colitis  type symptoms, however, the etiology is uncertain at this time.  Ischemia  would be less likely in regards to the distribution and infection can be a  possibility.  She has had diarrhea even before the colonoscopy as well as  the pain, however, no discernible mucosal abnormalities were noted during  her colonoscopy.  At this time, she will be treated with ciprofloxacin and  Flagyl as well as with pain control.  Hopefully, these will help to resolve  her current complaints of colitis and abdominal pain.   PLAN:  1. Ciprofloxacin 400 mg IV q.12h.  2. Flagyl 500 mg one IV q.8h.  3. Dilaudid 1-2 mg q.2-3h. p.r.n. pain.  4. Phenergan 25 mg one IV q.4-6h. p.r.n. pain.  5. She will be placed on a clear liquid diet.      Jordan Hawks Elnoria Howard, MD   Electronically Signed     PDH/MEDQ  D:  07/04/2006  T:  07/04/2006  Job:  [id number]   cc:   Candyce Churn. Allyne Gee, M.D.  Madlyn Frankel Charlann Boxer, M.D.

## 2011-03-09 NOTE — Discharge Summary (Signed)
NAMEAERALYN, BARNA               ACCOUNT NO.:  [id number]   MEDICAL RECORD NO.:  [id number]          PATIENT TYPE:  INP   LOCATION:  1510                         FACILITY:  [facility]   PHYSICIAN:  Madlyn Frankel. Charlann Boxer, M.D.  DATE OF BIRTH:  1951/07/01   DATE OF ADMISSION:  11/08/2005  DATE OF DISCHARGE:  11/12/2005                                 DISCHARGE SUMMARY   ADMISSION DIAGNOSES:  1.  Bilateral hip avascular necrosis, right worse than left.  2.  Hypertension.  3.  Seasonal allergies.   DISCHARGE DIAGNOSES:  1.  Bilateral hip avascular necrosis, right worse than left.  2.  Hypertension.  3.  Seasonal allergies.  4.  Mild postoperative anemia.   OPERATION:  On November 08, 2005, the patient underwent DePuy hip system with  size 50 ASR cup, size 10 standard Trilock stem with a 45 ASR ball and a +8  adaptor.  Jenne Campus, PA-C assisted.   CONSULTS:  None.   BRIEF HISTORY:  This 60 year old lady with ongoing bilateral hip pain.  Was  having more and more difficulty getting about secondary to this pain.  She  had marked pain into the right groin, right worse than left.  The joint  space was maintained, but MRI showed AVN in both hips, more so in the right  than the left.  The MRI was ordered due to the fact the patient had failed  any conservative measures usually used for hip pain.  After discussing the  treatment options, including the risks and benefits of surgery, it was  decided to go ahead for the above surgical procedure, and she was admitted  for the same.   HOSPITAL COURSE:  The patient tolerated the surgical procedure quite well.  She had a minimal drop in hemoglobin postoperatively, which was expected.  She entered into the total hip protocol eagerly, progressing very nicely  with her therapy.  She was placed on Coumadin protocol postoperatively for  prevention of DVT.  Her right lower extremity remained neurovascularly  intact.  Her calf remained soft.  The wound  remained dry without evidence of  any drainage or infection.  She did have some mild hypokalemia.  We gave her  several doses of potassium, and her final potassium was at 3.8.   Home health was arranged through [place].  She was fitted with Durable  Medical Goods, including a rolling walker, for her safety at home.   The morning of discharge, she had a final visit from occupational therapy  and physical therapy for ADLs and stairs, which she has at home.   Laboratory values in the hospital hematologically showed a CBC  preoperatively completely within normal limits.  Hemoglobin was 13.8 with  hematocrit of 41.8.  Final hemoglobin was 11.1 with hematocrit of 34.1.  Blood chemistries preoperatively were normal.  Postoperatively, showed a  hypokalemia of 3.2, several doses of p.o. potassium were given, as mentioned  above.  Urinalysis were negative for urinary tract infection.   No chest x-ray seen on this chart.   Electrocardiogram from unknown date showed a sinus rhythm  with anterior  septal ST/T changes.   CONDITION ON DISCHARGE:  Improved, stable.   PLAN:  The patient is discharged to her home.  Return to see Dr. Charlann Boxer in two  weeks after the date of surgery.  She is to maintain 50% weightbearing to  the right lower extremity.  Dry dressing p.r.n. to the hip wound.  Continue  with Coumadin protocol four weeks after the date of surgery.  She is  encouraged to call should she have any problems.  I have advised her to  follow up with Dr. Dorothyann Peng of [facility] in the not too  distant future.  She was given Vicodin for discomfort, Robaxin for muscle  relaxant, and a Coumadin prescription.  She is to continue with her home  medications and diet.      Dooley L. Cherlynn June.      Madlyn Frankel Charlann Boxer, M.D.  Electronically Signed    DLU/MEDQ  D:  11/12/2005  T:  11/12/2005  Job:  [id number]   cc:   Candyce Churn. Allyne Gee, M.D.  Fax: [phone]

## 2011-03-09 NOTE — Discharge Summary (Signed)
[name], [name]                         ACCOUNT NO.:  [id number]   MEDICAL RECORD NO.:  [id number]                   PATIENT TYPE:  INP   LOCATION:  2001                                 FACILITY:  [facility]   PHYSICIAN:  Runell Gess, M.D.             DATE OF BIRTH:  December 22, 1950   DATE OF ADMISSION:  06/05/2002  DATE OF DISCHARGE:  06/08/2002                                 DISCHARGE SUMMARY   DISCHARGE DIAGNOSES:  1. Chest pain nonspecific, negative for myocardial infarction.  2. Tachycardia.  3. Hypertension.  4. Anxiety.  5. Tobacco abuse.  6. Patent coronary arteries.   CONDITION ON DISCHARGE:  Improved.   PROCEDURE:  June 08, 2002, combined left heart catheterization by [name].  [name], M.D., with Perclose at end of procedure.   DISCHARGE MEDICATIONS:  1. Maxzide, stop using.  2. Toprol XL 25 mg one daily.  3. Protonix 40 mg daily.  4. Baby aspirin daily.  5. Vioxx 25 mg one daily for two weeks.   DISCHARGE INSTRUCTIONS:  1. No tub baths or swimming for one week.  2. No strenuous activity, no sexual activity, no lifting over 10 pounds for     two days then resume regular activity including work.  3. Low fat, low salt diet.  4. Wash right groin catheterization site and shower tomorrow.  Call if any     bleeding, swelling or drainage.  5. Call the office Tuesday and make an appointment to see Dr. Elsie Luna in two     weeks.   HISTORY OF PRESENT ILLNESS:  A 60 year old African American female with  complaints of chest pain and no prior history of coronary disease who was  admitted to [facility] June 05, 2002, secondary to  chest pain.   The patient was having her nails done between 12:30 and 1 p.m. when she  suddenly experienced onset of burning sensation in the left side of the  chest with radiation to the left elbow and left side of the middle back. She  also felt nauseated.  No shortness of breath or diaphoresis.  She had seen  Dr.  Elsie Luna in the past for syncopal episode and had had a stress test  previously.   PAST MEDICAL HISTORY:  As stated above, also hypertension, status post  breast reduction, hysterectomy, arthroscopy, and low back surgery.   For family history, social history, and review of systems see H&P.   PHYSICAL EXAMINATION AT DISCHARGE:  GENERAL APPEARANCE:  An alert and  oriented female in no acute distress.  SKIN:  Warm and dry.  VITAL SIGNS:  Blood pressure 120/70, pulse 69, respiratory rate 12,  temperature 97.2.  LUNGS:  Clear.  CARDIOVASCULAR:  S1 and S2 regular rate and rhythm.  EXTREMITIES:  No edema.  Right groin catheterization site stable with no  hematoma.   LABORATORY DATA:  Hemoglobin on admission 12.8, hematocrit  39.4, wbc 5.2,  MCV 89.8, platelets 232.  PTT on heparin 162.  Chemistry with sodium 142,  potassium 4.2, chloride 107, CO2 29, glucose 101, BUN 13, creatinine 0.7,  and calcium 8.8.   Chest x-ray on admission with no active disease.   EKG on June 05, 2002, sinus tachycardia, heart rate 107, right atrial  enlargement, nonspecific T-wave abnormality.  Follow-up later that day with  nonspecific T-wave abnormality, sinus rhythm.  On June 06, 2002, sinus  rhythm, sinus arrhythmia.   HOSPITAL COURSE:  The patient was admitted on June 05, 2002, with chest  pain and serial CK-MBs were ordered. Unfortunately, I do not have those back  in the chart either.  Her potassium actually on June 06, 2002, was 2.9  which is not documented in the labs.  I do not think they have the whole  laboratory information in the chart.  Potassium supplement was given.   She underwent cardiac catheterization by Dr. Jacinto Luna revealing normal LV  function, normal coronaries and normal renal arteries.  She was closed with  Perclose and was ready for discharge June 08, 2002.  Please note her lipid  panel, again not on her laboratory file, cholesterol 212, triglycerides 192,  HDL 64, and LDL  110.  Because of the hypokalemia, her Maxzide was  discontinued and Toprol was continued in addition to her tachycardia.  She  will follow up as an outpatient with Dr. Elsie Luna and her primary care  physician, Dr. Sung Luna.      [name]                     Runell Gess, M.D.    LRI/MEDQ  D:  06/29/2002  T:  06/30/2002  Job:  [id number]   cc:   Runell Gess, M.D.  [address], [address]  [place]  [place] [address]  Fax: [phone]   Madaline Savage, M.D.  [phone], [address]  [place]  [place] [address]  Fax: [phone]   [name]. [name], M.D.

## 2011-03-09 NOTE — H&P (Signed)
NAMEINEZE, SERRAO               ACCOUNT NO.:  [id number]   MEDICAL RECORD NO.:  [id number]          PATIENT TYPE:  INP   LOCATION:  2916                         FACILITY:  [facility]   PHYSICIAN:  Thomasenia Bottoms, MDDATE OF BIRTH:  1951-05-30   DATE OF ADMISSION:  09/02/2006  DATE OF DISCHARGE:                                HISTORY & PHYSICAL   CHIEF COMPLAINT:  Chest pain.   HISTORY OF PRESENT ILLNESS:  Mrs. Cephus is a 60 year old woman whose  cardiac risk factors include being postmenopausal, tobacco abuse, and  hypertension.  She presents today with stabbing lower sternal chest pain,  which radiates around her chest into her back.  She did have some shortness  of breath, which resolved spontaneously, and she has had some nausea with  the pain as well.  No diaphoresis.  The patient was definitely worried that  she was having a heart attack.  She lives alone and was scared to die by  herself, so she came to the emergency department.  She had similar  discomfort about 1 month ago and was seen at the [facility].  At that  time, she was sent home with Vicodin.  The patient says this time the pain  is more severe than it was that time at [facility].  She has a mild cough.  It is nonproductive.  She has no fevers.   PAST MEDICAL HISTORY:  1. Significant for right hip replacement and history of avascular necrosis      of both hips.  2. Cholecystectomy.  3. Hysterectomy secondary to bleeding.  4. History of a breast reduction.   MEDICATIONS ON ARRIVAL:  1. Maxzide 25 mg p.o. daily.  2. Zyrtec every day.   FAMILY HISTORY:  Significant for no MI or blood clots that she is aware of.  She is unaware of any family history on her father's side at all.   SOCIAL HISTORY:  She does smoke cigarettes.  She does drink alcohol.  She  says 2 martinis about 3 to 4 times a week.  No illicit drugs.  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   REVIEW OF SYSTEMS:  Constitutional:  She  denies any weight loss.  Her  appetite is fine.  She does have night sweats, which she thinks are probably  hot flashes periodically.  She had 1 a couple of nights ago. HEENT:  No sore  throat, ringing in her ears, or double vision reported.  Cardiovascular:  She has chest pain, as mentioned above.  She denies any lower extremity  edema.  Respiratory:  She has a mild cough and shortness of breath.  No  hemoptysis.  GI:  She denies diarrhea, constipation, or abdominal pain.  She  has not vomited any blood or seen blood in her stools.  She denies any  heartburn or acid reflux symptoms.  GU:  No hematuria.  GYN:  She is post  hysterectomy and has symptoms of menopause.  Psychiatric:  She has some mild  trouble with insomnia, but she denies any feelings of depression.  She does  report that she is under a great deal of stress at this time.  Neurologic:  No slurred speech.  No asymmetric weakness.  No paresthesias.  No seizures.  Integumentary:  She denies open rashes or lesions.   PHYSICAL EXAMINATION:  VITAL SIGNS:  In the emergency department, her  temperature is 98.1.  Pulse is 111.  Respiratory rate is 18.  Blood pressure  is 139/84.  GENERAL:  She is well nourished, well developed and in no acute distress.  HEENT:  Normocephalic and atraumatic.  Pupils are equal and round.  Sclerae  are nonicteric.  Oral mucosa moist.  NECK:  Supple with no lymphadenopathy.  No thyromegaly.  No jugulovenous  distention.  CARDIOVASCULAR:  Regular rate and rhythm with no murmurs, gallops, or rubs.  LUNGS:  Clear to auscultation bilaterally.  No wheezes, rhonchi, or rales.  ABDOMEN:  Soft.  She has some minimal right upper quadrant tenderness.  No  rebound.  No guarding.  No masses are appreciated.  EXTREMITIES:  No evidence of clubbing, cyanosis, or edema.  Her DP pulses  are faintly palpable bilaterally.  NEUROLOGICALLY:  She is alert and oriented x3.  She has no slurred speech.  Her cranial nerves II  through XII are intact grossly.  she has 5/5 strength  in her upper and lower extremities.  Her sensory exam is intact grossly in  her upper and lower extremities.  Normal muscle tone and bulk.  SKIN:  No open lesions noted.  She does have a scar in her left axillary  line from her previous breast reduction surgery.   LABORATORY DATA:  Sodium is 135.  Potassium 3.8.  Chloride 108.  Bicarb 22.  Glucose 93.  __________ 24.5.  Hemoglobin 16.3.  Hematocrit 48.0.   ASSESSMENT AND PLAN:  1. Chest pain in a patient with a few cardiac risk factors.  We will admit      her to the hospital overnight for observation and rule her out for an      MRI.  We will keep her on a telemetry monitor and then we will attempt      to do a stress test in the morning.  I will do a stress test.  It has      been several years since her last stress test.  She actually says that      she may have even had a cardiac catheterization several years ago, but      that was greater than 3 years ago.  She does have several risk factors      and this is her second ER visit for chest pain in the last month.  The      patient's EKG reveals a normal sinus rhythm with some right atrial      enlargement.  She has no ST segment elevation or depression.  When she      was at [facility] approximately 1 month ago, she did have a CT scan of      her chest, which ruled out pulmonary embolus.  2. Hypertension.  We will continue her Maxzide.  Her blood pressure at      this time is well controlled.  3. Tobacco abuse.  I have counseled the patient to quit smoking.  We will      continue counseling while she is in the hospital.      Thomasenia Bottoms, MD  Electronically Signed     CVC/MEDQ  D:  09/02/2006  T:  09/03/2006  Job:  [id number]   cc:   Candyce Churn. Allyne Gee, M.D.

## 2011-03-09 NOTE — Op Note (Signed)
NAME:  [name], [name]                         ACCOUNT NO.:  [id number]   MEDICAL RECORD NO.:  [id number]                   PATIENT TYPE:  AMB   LOCATION:  ENDO                                 FACILITY:  [facility]   PHYSICIAN:  Anselmo Rod, M.D.               DATE OF BIRTH:  14-May-1951   DATE OF PROCEDURE:  09/30/2002  DATE OF DISCHARGE:                                 OPERATIVE REPORT   PROCEDURE PERFORMED:  Colonoscopy with snare polypectomy x 1.   ENDOSCOPIST:  Anselmo Rod, M.D.   INSTRUMENT USED:  Olympus video colonoscope.   INDICATIONS FOR PROCEDURE:  The patient is a 60 year old African-American  female with a history of rectal bleeding, rule out colonic polyps, masses,  hemorrhoids, etc.   PREPROCEDURE PREPARATION:  Informed consent was secured from the patient.  The patient was fasted for eight hours prior to  the procedure and was  prepped with a bottle of magnesium citrate and a gallon of NuLytely the  night prior to the procedure.   PREPROCEDURE PHYSICAL:  The patient had stable vital signs. Neck was supple.  Chest clear to auscultation. S1, S2 regular. Abdomen soft with normal bowel  sounds.   DESCRIPTION OF PROCEDURE:  The patient was placed in the left lateral  decubitus position and sedated with 100 mg of Demerol and 10 mg of Versed  intravenously. Once the patient was adequately sedated and maintained on low  flow oxygen  and continuous cardiac monitoring, the Olympus video  colonoscope was advanced into the rectum to the cecum with difficulty.   A large pedunculated polyp was snared  from the proximal right colon. An  attempt was made to gently remove this polyp by holding suction onto the  scope and withdrawing the scope out of  the rectum but the polyp dropped in  between and the scope had to be reinserted. When the basket was passed the  polyp was picked up with  the basket and  removed with the entire scope.   The colonoscopy was  repeated for the  third time. When the procedure was  complete of the right colon and adequate visualization  was completed, there  was evidence of left-sided diverticulosis. No other masses or polyps were  seen. The patient tolerated the procedure well without complications.   IMPRESSION:  1. Left-sided diverticulosis.  2. Large pedunculated polyp snared from the proximal right colon.  3. Prolonged procedure with no other abnormalities noted except for the ones     mentioned  above.    RECOMMENDATIONS:  1. Await pathology results.  2. Avoid all nonsteroidals including aspirin for the next three to four     weeks.  3. Outpatient follow up in the next two weeks for further recommendations.  Anselmo Rod, M.D.    JNM/MEDQ  D:  09/30/2002  T:  09/30/2002  Job:  [id number]   cc:   Candyce Churn. Allyne Gee, M.D.  [phone] [place]  [place] [address]  Fax: [phone]

## 2011-05-21 ENCOUNTER — Other Ambulatory Visit: Payer: Self-pay | Admitting: Internal Medicine

## 2011-05-21 DIAGNOSIS — Z1231 Encounter for screening mammogram for malignant neoplasm of breast: Secondary | ICD-10-CM

## 2011-06-12 ENCOUNTER — Ambulatory Visit
Admission: RE | Admit: 2011-06-12 | Discharge: 2011-06-12 | Disposition: A | Discharge status: Home / Self Care | Source: Ambulatory Visit | Attending: Internal Medicine | Admitting: Internal Medicine

## 2011-06-12 ENCOUNTER — Ambulatory Visit

## 2011-06-12 DIAGNOSIS — Z1231 Encounter for screening mammogram for malignant neoplasm of breast: Secondary | ICD-10-CM

## 2011-07-23 LAB — PROTIME-INR
INR: 1
INR: 1
Prothrombin Time: 13.1
Prothrombin Time: 13.3

## 2011-07-23 LAB — BASIC METABOLIC PANEL
BUN: 8
Creatinine, Ser: 0.79
GFR calc non Af Amer: >60
Potassium: 3.9

## 2011-07-23 LAB — BLOOD GAS, ARTERIAL
Acid-Base Excess: 3.4 — ABNORMAL HIGH
Bicarbonate: 28.1 — ABNORMAL HIGH
Drawn by: 20517
FIO2: 0.21
O2 Saturation: 84.6
Patient temperature: 98.6
TCO2: 29.6
pCO2 arterial: 48.5 — ABNORMAL HIGH
pH, Arterial: 7.381
pO2, Arterial: 50.9 — ABNORMAL LOW

## 2011-07-23 LAB — DIFFERENTIAL
Lymphocytes Relative: 46
Lymphs Abs: 2.6
Neutrophils Relative %: 42 — ABNORMAL LOW

## 2011-07-23 LAB — CBC
Platelets: 216
WBC: 5.7

## 2011-07-24 LAB — DIFFERENTIAL
Basophils Absolute: 0
Eosinophils Relative: 0
Lymphocytes Relative: 34
Lymphocytes Relative: 45
Lymphs Abs: 3
Monocytes Relative: 9
Neutro Abs: 3
Neutrophils Relative %: 44

## 2011-07-24 LAB — TROPONIN I
Troponin I: 0.01
Troponin I: <0.01

## 2011-07-24 LAB — LIPID PANEL
HDL: 31 — ABNORMAL LOW
LDL Cholesterol: 78
Total CHOL/HDL Ratio: 4.5
VLDL: 29

## 2011-07-24 LAB — CARDIAC PANEL(CRET KIN+CKTOT+MB+TROPI)
Relative Index: INVALID
Relative Index: INVALID
Total CK: 58
Troponin I: 0.01

## 2011-07-24 LAB — COMPREHENSIVE METABOLIC PANEL
AST: 21
BUN: 20
CO2: 24
Chloride: 104
Creatinine, Ser: 0.74
GFR calc non Af Amer: >60
Total Bilirubin: 0.7

## 2011-07-24 LAB — PROTIME-INR
INR: 1.6 — ABNORMAL HIGH
Prothrombin Time: 12.8

## 2011-07-24 LAB — CBC
HCT: 44
MCV: 86.6
MCV: 86.1
RBC: 5.08
RBC: 4.54
WBC: 7.5
WBC: 6.7

## 2011-07-24 LAB — APTT: aPTT: 46 — ABNORMAL HIGH

## 2011-07-24 LAB — HEMOGLOBIN A1C
Hgb A1c MFr Bld: 6.2 — ABNORMAL HIGH
Mean Plasma Glucose: 131

## 2011-07-24 LAB — B-NATRIURETIC PEPTIDE (CONVERTED LAB): Pro B Natriuretic peptide (BNP): 11

## 2011-07-24 LAB — POCT CARDIAC MARKERS
CKMB, poc: <1 — ABNORMAL LOW
CKMB, poc: <1 — ABNORMAL LOW
Myoglobin, poc: 70
Myoglobin, poc: 37.9
Troponin i, poc: <0.05
Troponin i, poc: <0.05
Troponin i, poc: <0.05

## 2011-07-24 LAB — CK TOTAL AND CKMB (NOT AT ARMC)
CK, MB: 0.8
Relative Index: INVALID
Total CK: 58

## 2011-07-24 LAB — POCT I-STAT, CHEM 8
Chloride: 109
HCT: 41
Hemoglobin: 13.9
Potassium: 4.7
Sodium: 140

## 2011-07-27 LAB — URINALYSIS, ROUTINE W REFLEX MICROSCOPIC
Ketones, ur: NEGATIVE mg/dL
Nitrite: NEGATIVE
Urobilinogen, UA: 0.2 mg/dL (ref 0.0–1.0)
pH: 6 (ref 5.0–8.0)

## 2011-07-27 LAB — PROTIME-INR: INR: 4.5 — ABNORMAL HIGH (ref 0.00–1.49)

## 2011-07-27 LAB — APTT: aPTT: 46 seconds — ABNORMAL HIGH (ref 24–37)

## 2011-07-27 LAB — URINE CULTURE: Colony Count: NO GROWTH

## 2011-08-01 ENCOUNTER — Emergency Department (HOSPITAL_COMMUNITY)

## 2011-08-01 ENCOUNTER — Emergency Department (HOSPITAL_COMMUNITY)
Admission: EM | Admit: 2011-08-01 | Discharge: 2011-08-01 | Disposition: A | Discharge status: Home / Self Care | Attending: Emergency Medicine | Admitting: Emergency Medicine

## 2011-08-01 DIAGNOSIS — R51 Headache: Secondary | ICD-10-CM | POA: Insufficient documentation

## 2011-08-01 DIAGNOSIS — I10 Essential (primary) hypertension: Secondary | ICD-10-CM | POA: Insufficient documentation

## 2011-08-01 DIAGNOSIS — R55 Syncope and collapse: Secondary | ICD-10-CM | POA: Insufficient documentation

## 2011-08-01 DIAGNOSIS — Z86718 Personal history of other venous thrombosis and embolism: Secondary | ICD-10-CM | POA: Insufficient documentation

## 2011-08-02 LAB — COMPREHENSIVE METABOLIC PANEL
ALT: 18
Alkaline Phosphatase: 60
BUN: 23
CO2: 26
Calcium: 9.3
GFR calc non Af Amer: >60
Glucose, Bld: 118 — ABNORMAL HIGH
Sodium: 142
Total Protein: 6.3

## 2011-08-02 LAB — BASIC METABOLIC PANEL
CO2: 28
Calcium: 9.3
Chloride: 100
GFR calc Af Amer: >60
Glucose, Bld: 104 — ABNORMAL HIGH
Potassium: 4
Sodium: 137

## 2011-08-02 LAB — DIFFERENTIAL
Basophils Relative: 1
Eosinophils Absolute: 0.1
Neutro Abs: 4.2
Neutrophils Relative %: 53

## 2011-08-02 LAB — CBC
HCT: 42
HCT: 39.8
Hemoglobin: 14
Hemoglobin: 13.3
MCHC: 33.5
MCHC: 33.3
MCV: 84.4
RBC: 4.97
RBC: 4.69
RDW: 15.6 — ABNORMAL HIGH
RDW: 15.6 — ABNORMAL HIGH

## 2011-08-02 LAB — URINALYSIS, ROUTINE W REFLEX MICROSCOPIC
Bilirubin Urine: NEGATIVE
Glucose, UA: NEGATIVE
Hgb urine dipstick: NEGATIVE
Ketones, ur: NEGATIVE
Nitrite: NEGATIVE
Specific Gravity, Urine: 1.025
pH: 5.5

## 2011-08-03 LAB — BASIC METABOLIC PANEL
BUN: 13
Chloride: 106
GFR calc non Af Amer: >60
Glucose, Bld: 107 — ABNORMAL HIGH
Potassium: 3.4 — ABNORMAL LOW
Sodium: 140

## 2011-08-03 LAB — HEPATIC FUNCTION PANEL
ALT: 18
AST: 23
Bilirubin, Direct: 0.2
Indirect Bilirubin: 0.3
Total Bilirubin: 0.5

## 2011-08-03 LAB — EHEC TOXIN BY EIA, STOOL

## 2011-08-03 LAB — CBC
HCT: 39.9
Hemoglobin: 13.2
MCV: 85.3
Platelets: 229
RDW: 15.1 — ABNORMAL HIGH

## 2011-08-03 LAB — CLOSTRIDIUM DIFFICILE EIA

## 2011-08-03 LAB — GIARDIA/CRYPTOSPORIDIUM SCREEN(EIA)
Cryptosporidium Screen (EIA): NEGATIVE
Giardia Screen - EIA: NEGATIVE

## 2012-01-09 ENCOUNTER — Other Ambulatory Visit (HOSPITAL_COMMUNITY): Payer: Self-pay | Admitting: Orthopedic Surgery

## 2012-01-09 DIAGNOSIS — M25551 Pain in right hip: Secondary | ICD-10-CM

## 2012-01-09 DIAGNOSIS — M25552 Pain in left hip: Secondary | ICD-10-CM

## 2012-01-12 ENCOUNTER — Ambulatory Visit (HOSPITAL_COMMUNITY)
Admission: RE | Admit: 2012-01-12 | Discharge: 2012-01-12 | Disposition: A | Discharge status: Home / Self Care | Source: Ambulatory Visit | Attending: Orthopedic Surgery | Admitting: Orthopedic Surgery

## 2012-01-12 DIAGNOSIS — M25551 Pain in right hip: Secondary | ICD-10-CM

## 2012-01-12 DIAGNOSIS — Z96649 Presence of unspecified artificial hip joint: Secondary | ICD-10-CM | POA: Insufficient documentation

## 2012-01-12 DIAGNOSIS — M25552 Pain in left hip: Secondary | ICD-10-CM

## 2012-01-12 DIAGNOSIS — M25559 Pain in unspecified hip: Secondary | ICD-10-CM | POA: Insufficient documentation

## 2012-01-12 MED ORDER — GADOBENATE DIMEGLUMINE 529 MG/ML IV SOLN
11.0000 mL | Freq: Once | INTRAVENOUS | Status: AC | PRN
  Administered 2012-01-12: 11 mL via INTRAVENOUS

## 2012-03-28 ENCOUNTER — Other Ambulatory Visit: Payer: Self-pay | Admitting: Orthopedic Surgery

## 2012-03-31 ENCOUNTER — Encounter (HOSPITAL_COMMUNITY): Payer: Self-pay

## 2012-04-04 ENCOUNTER — Ambulatory Visit (HOSPITAL_COMMUNITY)
Admission: RE | Admit: 2012-04-04 | Discharge: 2012-04-04 | Disposition: A | Discharge status: Home / Self Care | Payer: Self-pay | Source: Ambulatory Visit | Attending: Orthopedic Surgery | Admitting: Orthopedic Surgery

## 2012-04-04 ENCOUNTER — Encounter (HOSPITAL_COMMUNITY): Payer: Self-pay

## 2012-04-04 ENCOUNTER — Encounter (HOSPITAL_COMMUNITY)
Admission: RE | Admit: 2012-04-04 | Discharge: 2012-04-04 | Disposition: A | Discharge status: Home / Self Care | Payer: Self-pay | Source: Ambulatory Visit | Attending: Orthopedic Surgery | Admitting: Orthopedic Surgery

## 2012-04-04 DIAGNOSIS — Z01818 Encounter for other preprocedural examination: Secondary | ICD-10-CM | POA: Insufficient documentation

## 2012-04-04 DIAGNOSIS — Z01812 Encounter for preprocedural laboratory examination: Secondary | ICD-10-CM | POA: Insufficient documentation

## 2012-04-04 DIAGNOSIS — Z01810 Encounter for preprocedural cardiovascular examination: Secondary | ICD-10-CM | POA: Insufficient documentation

## 2012-04-04 HISTORY — DX: Effusion, unspecified joint: M25.40

## 2012-04-04 HISTORY — DX: Cardiac murmur, unspecified: R01.1

## 2012-04-04 HISTORY — DX: Essential (primary) hypertension: I10

## 2012-04-04 HISTORY — DX: Unspecified osteoarthritis, unspecified site: M19.90

## 2012-04-04 HISTORY — DX: Allergy status to unspecified drugs, medicaments and biological substances: Z88.9

## 2012-04-04 HISTORY — DX: Anxiety disorder, unspecified: F41.9

## 2012-04-04 HISTORY — DX: Personal history of other diseases of the nervous system and sense organs: Z86.69

## 2012-04-04 HISTORY — DX: Hyperlipidemia, unspecified: E78.5

## 2012-04-04 HISTORY — DX: Personal history of other venous thrombosis and embolism: Z86.718

## 2012-04-04 HISTORY — DX: Diverticulosis of intestine, part unspecified, without perforation or abscess without bleeding: K57.90

## 2012-04-04 HISTORY — DX: Insomnia, unspecified: G47.00

## 2012-04-04 HISTORY — DX: Personal history of other infectious and parasitic diseases: Z86.19

## 2012-04-04 HISTORY — DX: Personal history of other specified conditions: Z87.898

## 2012-04-04 HISTORY — DX: Pain in unspecified joint: M25.50

## 2012-04-04 LAB — URINALYSIS, ROUTINE W REFLEX MICROSCOPIC
Glucose, UA: NEGATIVE mg/dL
Leukocytes, UA: NEGATIVE
Specific Gravity, Urine: 1.019 (ref 1.005–1.030)
pH: 6 (ref 5.0–8.0)

## 2012-04-04 LAB — CBC
HCT: 40.8 % (ref 36.0–46.0)
Hemoglobin: 13.4 g/dL (ref 12.0–15.0)
MCH: 28.5 pg (ref 26.0–34.0)
MCHC: 32.8 g/dL (ref 30.0–36.0)

## 2012-04-04 LAB — BASIC METABOLIC PANEL
BUN: 18 mg/dL (ref 6–23)
Chloride: 101 mEq/L (ref 96–112)
Creatinine, Ser: 0.81 mg/dL (ref 0.50–1.10)
GFR calc non Af Amer: 76 mL/min — ABNORMAL LOW (ref >90)
Glucose, Bld: 103 mg/dL — ABNORMAL HIGH (ref 70–99)
Potassium: 4.1 mEq/L (ref 3.5–5.1)

## 2012-04-04 LAB — SURGICAL PCR SCREEN: MRSA, PCR: NEGATIVE

## 2012-04-04 LAB — DIFFERENTIAL
Basophils Relative: 1 % (ref 0–1)
Eosinophils Absolute: 0 10*3/uL (ref 0.0–0.7)
Monocytes Absolute: 0.6 10*3/uL (ref 0.1–1.0)
Monocytes Relative: 9 % (ref 3–12)

## 2012-04-04 NOTE — Pre-Procedure Instructions (Signed)
20 [name]  04/04/2012   Your procedure is scheduled on:  Mon, June 24 @ 1:00 PM  Report to [facility] at 11:00 AM.  Call this number if you have problems the morning of surgery: [phone]   Remember:   Do not eat food:After Midnight.    Take these medicines the morning of surgery with A SIP OF WATER: Valium(Diazepam) andNebivolol(Bystolic)   Do not wear jewelry, make-up or nail polish.  Do not wear lotions, powders, or perfumes.   Do not shave 48 hours prior to surgery.   Do not bring valuables to the hospital.  Contacts, dentures or bridgework may not be worn into surgery.  Leave suitcase in the car. After surgery it may be brought to your room.  For patients admitted to the hospital, checkout time is 11:00 AM the day of discharge.   Patients discharged the day of surgery will not be allowed to drive home.    Special Instructions: CHG Shower Use Special Wash: 1/2 bottle night before surgery and 1/2 bottle morning of surgery.   Please read over the following fact sheets that you were given: Pain Booklet, Coughing and Deep Breathing, Blood Transfusion Information, Total Joint Packet, MRSA Information and Surgical Site Infection Prevention

## 2012-04-04 NOTE — Progress Notes (Addendum)
Was seeing Dr.Sulliman but will see Dr.C on June 19th for the 1st time-need to request ov  Last visit with Lewanda Rife was about 7-43mon ago-to request ov  Stress test done about a yr ago @ Dr.Sullimans office-to request  Echo <71yrs ago Heart cath in epic from 2003 and 2007  Dr.Robin Allyne Gee is Medical MD-ekg-to request report

## 2012-04-07 NOTE — Consult Note (Addendum)
Anesthesia Chart Review:  Patient is a 61 year old female scheduled for a right TH revision on 04/14/12.  History includes smoking exposure (passive smoker), HTN, HLD, migraines, insomnia, anxiety, arthritis, blood transfusion, murmur (not specified).  PCP is Dr. Dorothyann Peng of [facility]).  PAT notes indicate that she is seeing Dr. Royann Shivers [facility]) on 04/09/12.  EKG from 03/07/12 ([facility]) showed NSR, low voltage QRS in precordial leads, LAE, negative T waves in anterior leads and lead III--fairly non-specific.  Currently, her last echo is from 02/06/10 [facility]) and shows normal LV/RV systolic function, trace MR/TR.  Her last stress test 11/04/08 [facility]) showed normal myocardial perfusion, EF 81%.   There was no active disease on her CXR from 04/04/12.  Labs noted.    I'll follow-up on Cardiology records when available.  Shonna Chock, PA-C 04/07/12 1305  Addendum: 04/11/12 0935  Reviewed last Cardiology records from 04/09/12 when she saw Dr. Royann Shivers.  Her EKG then showed NSR, possible LAE, T wave inversion in V1-6--new when compared to her recent EKG at [facility], but Dr. Royann Shivers noted similar findings on EKGs dating back to 2002.  He ultimately felt she would be low perioperative risk for major CV complications.  He did recommend a repeat echo, but did not feel it had to be done pre-operatively.  Plan to proceed.

## 2012-04-09 NOTE — H&P (Signed)
Subjective: Patient returns today with new cobalt and chromium I on levels of 6.6, and 6.1 respectively.  They are still within normal limits.  MRI scans done in March of 2013 did not show any fluid collections or marrow edema.  She still having significant pain, right greater than left hip.  She is using one or 2 Tramadol a day.  Her right total hip was done by Dr. Charlann Boxer in 2007, her left total hip was done by me in 2010.  Past Medical Hx: Allergic to ibuprofen.  S/P bilateral THA.  High cholesterol.  Takes Crestor.    Family Hx: HTN, arthritis  Social Hx: Smokes, occasional alcohol.  ROS: blood clot. Patient denies dizziness, nausea, fever, chills, vomiting, shortness of breath, chest pain, loss of appetite, or rash.    PHYSICAL EXAM: Well-developed, well-nourished.  Awake, alert, and oriented x3.  Extraocular motion is intact.  No use of accessory respiratory muscles for breathing.   Cardiovascular exam reveals a regular rhythm.  Skin is intact without cuts, scrapes, or abrasions. She walks with a right-sided limp, surgical scar is well healed with no swelling or erythema.  Internal next rotation of the hips seem to cause some discomfort.  Plain x-rays reviewed showing no obvious loosening, although there is a small halo around the right ASR cup.  Assess: Painful ASR total hip placed in 2007 with gradually increasing metal ions.  Plan: We once again discussed her situation.  She would like to proceed with revision and we'll try to get this accomplished for her at her convenience.  Same benefits of surgery discussed at length and I please she understands what she is getting herself into.

## 2012-04-10 NOTE — Progress Notes (Signed)
Spoke with medical records at [facility] they will fax office note from visit on 01/08/12

## 2012-04-11 NOTE — Progress Notes (Signed)
Re-requested OV from 03-09-2012.

## 2012-04-11 NOTE — Progress Notes (Signed)
Pt. Notified of time change for surgery.  To arrive at 9:30 AM.

## 2012-04-13 MED ORDER — CHLORHEXIDINE GLUCONATE 4 % EX LIQD: 60.0000 mL | Freq: Once | CUTANEOUS | Status: DC

## 2012-04-13 MED ORDER — CEFAZOLIN SODIUM-DEXTROSE 2-3 GM-% IV SOLR: 2.0000 g | INTRAVENOUS | Status: DC

## 2012-04-13 MED ORDER — DEXTROSE-NACL 5-0.45 % IV SOLN: INTRAVENOUS | Status: DC

## 2012-04-14 ENCOUNTER — Encounter (HOSPITAL_COMMUNITY): Payer: Self-pay | Admitting: Vascular Surgery

## 2012-04-14 ENCOUNTER — Encounter (HOSPITAL_COMMUNITY): Payer: Self-pay

## 2012-04-14 ENCOUNTER — Inpatient Hospital Stay (HOSPITAL_COMMUNITY)
Admission: RE | Admit: 2012-04-14 | Discharge: 2012-04-17 | DRG: 468 | Disposition: A | Discharge status: Skilled Nursing Facility | Source: Ambulatory Visit | Attending: Orthopedic Surgery | Admitting: Orthopedic Surgery

## 2012-04-14 ENCOUNTER — Encounter (HOSPITAL_COMMUNITY)
Admission: RE | Disposition: A | Discharge status: Skilled Nursing Facility | Payer: Self-pay | Source: Ambulatory Visit | Attending: Orthopedic Surgery

## 2012-04-14 ENCOUNTER — Ambulatory Visit (HOSPITAL_COMMUNITY): Admitting: Vascular Surgery

## 2012-04-14 ENCOUNTER — Inpatient Hospital Stay (HOSPITAL_COMMUNITY)

## 2012-04-14 DIAGNOSIS — G4700 Insomnia, unspecified: Secondary | ICD-10-CM | POA: Diagnosis present

## 2012-04-14 DIAGNOSIS — Z888 Allergy status to other drugs, medicaments and biological substances status: Secondary | ICD-10-CM

## 2012-04-14 DIAGNOSIS — T8489XA Other specified complication of internal orthopedic prosthetic devices, implants and grafts, initial encounter: Principal | ICD-10-CM | POA: Diagnosis present

## 2012-04-14 DIAGNOSIS — Y92009 Unspecified place in unspecified non-institutional (private) residence as the place of occurrence of the external cause: Secondary | ICD-10-CM

## 2012-04-14 DIAGNOSIS — R011 Cardiac murmur, unspecified: Secondary | ICD-10-CM | POA: Diagnosis present

## 2012-04-14 DIAGNOSIS — I10 Essential (primary) hypertension: Secondary | ICD-10-CM | POA: Diagnosis present

## 2012-04-14 DIAGNOSIS — T8484XA Pain due to internal orthopedic prosthetic devices, implants and grafts, initial encounter: Secondary | ICD-10-CM

## 2012-04-14 DIAGNOSIS — Z86718 Personal history of other venous thrombosis and embolism: Secondary | ICD-10-CM

## 2012-04-14 DIAGNOSIS — Z96649 Presence of unspecified artificial hip joint: Secondary | ICD-10-CM

## 2012-04-14 DIAGNOSIS — Z8719 Personal history of other diseases of the digestive system: Secondary | ICD-10-CM

## 2012-04-14 DIAGNOSIS — F17200 Nicotine dependence, unspecified, uncomplicated: Secondary | ICD-10-CM | POA: Diagnosis present

## 2012-04-14 DIAGNOSIS — E785 Hyperlipidemia, unspecified: Secondary | ICD-10-CM | POA: Diagnosis present

## 2012-04-14 DIAGNOSIS — F411 Generalized anxiety disorder: Secondary | ICD-10-CM | POA: Diagnosis present

## 2012-04-14 DIAGNOSIS — J309 Allergic rhinitis, unspecified: Secondary | ICD-10-CM | POA: Diagnosis present

## 2012-04-14 DIAGNOSIS — Y831 Surgical operation with implant of artificial internal device as the cause of abnormal reaction of the patient, or of later complication, without mention of misadventure at the time of the procedure: Secondary | ICD-10-CM | POA: Diagnosis present

## 2012-04-14 HISTORY — PX: TOTAL HIP REVISION: SHX763

## 2012-04-14 SURGERY — TOTAL HIP REVISION
Anesthesia: General | Site: Hip | Laterality: Right | Wound class: Clean

## 2012-04-14 MED ORDER — SODIUM CHLORIDE 0.9 % IR SOLN
Status: DC | PRN
  Administered 2012-04-14: 1000 mL

## 2012-04-14 MED ORDER — ROCURONIUM BROMIDE 100 MG/10ML IV SOLN
INTRAVENOUS | Status: DC | PRN
  Administered 2012-04-14: 40 mg via INTRAVENOUS

## 2012-04-14 MED ORDER — CEFUROXIME SODIUM 1.5 G IJ SOLR
INTRAMUSCULAR | Status: AC
  Filled 2012-04-14: qty 1.5

## 2012-04-14 MED ORDER — HYDROMORPHONE HCL PF 1 MG/ML IJ SOLN
0.5000 mg | INTRAMUSCULAR | Status: DC | PRN
  Administered 2012-04-14 – 2012-04-16 (×10): 1 mg via INTRAVENOUS
  Filled 2012-04-14 (×10): qty 1

## 2012-04-14 MED ORDER — ZOLPIDEM TARTRATE 5 MG PO TABS
10.0000 mg | ORAL_TABLET | Freq: Every evening | ORAL | Status: DC | PRN
  Administered 2012-04-16 (×2): 10 mg via ORAL
  Filled 2012-04-14 (×2): qty 2
  Filled 2012-04-14: qty 1

## 2012-04-14 MED ORDER — PROPOFOL 10 MG/ML IV EMUL
INTRAVENOUS | Status: DC | PRN
  Administered 2012-04-14: 180 mg via INTRAVENOUS

## 2012-04-14 MED ORDER — ALUM & MAG HYDROXIDE-SIMETH 200-200-20 MG/5ML PO SUSP: 30.0000 mL | ORAL | Status: DC | PRN

## 2012-04-14 MED ORDER — BUPIVACAINE-EPINEPHRINE 0.5% -1:200000 IJ SOLN
INTRAMUSCULAR | Status: DC | PRN
  Administered 2012-04-14: 19 mL

## 2012-04-14 MED ORDER — HYDROMORPHONE HCL PF 1 MG/ML IJ SOLN
0.2500 mg | INTRAMUSCULAR | Status: DC | PRN
  Administered 2012-04-14 (×3): 0.5 mg via INTRAVENOUS

## 2012-04-14 MED ORDER — MIDAZOLAM HCL 5 MG/5ML IJ SOLN
INTRAMUSCULAR | Status: DC | PRN
  Administered 2012-04-14: 2 mg via INTRAVENOUS

## 2012-04-14 MED ORDER — ONDANSETRON HCL 4 MG/2ML IJ SOLN
4.0000 mg | Freq: Four times a day (QID) | INTRAMUSCULAR | Status: DC | PRN
  Administered 2012-04-14: 4 mg via INTRAVENOUS
  Filled 2012-04-14: qty 2

## 2012-04-14 MED ORDER — DIPHENHYDRAMINE HCL 12.5 MG/5ML PO ELIX: 12.5000 mg | ORAL_SOLUTION | ORAL | Status: DC | PRN

## 2012-04-14 MED ORDER — MENTHOL 3 MG MT LOZG: 1.0000 | LOZENGE | OROMUCOSAL | Status: DC | PRN

## 2012-04-14 MED ORDER — KCL IN DEXTROSE-NACL 20-5-0.45 MEQ/L-%-% IV SOLN
INTRAVENOUS | Status: DC
  Administered 2012-04-15 – 2012-04-16 (×3): via INTRAVENOUS
  Filled 2012-04-14 (×14): qty 1000

## 2012-04-14 MED ORDER — VANCOMYCIN HCL IN DEXTROSE 1-5 GM/200ML-% IV SOLN
INTRAVENOUS | Status: AC
  Filled 2012-04-14: qty 200

## 2012-04-14 MED ORDER — ATORVASTATIN CALCIUM 20 MG PO TABS
20.0000 mg | ORAL_TABLET | Freq: Every day | ORAL | Status: DC
  Administered 2012-04-14 – 2012-04-17 (×4): 20 mg via ORAL
  Filled 2012-04-14 (×5): qty 1

## 2012-04-14 MED ORDER — LACTATED RINGERS IV SOLN
INTRAVENOUS | Status: DC | PRN
  Administered 2012-04-14 (×2): via INTRAVENOUS

## 2012-04-14 MED ORDER — KCL IN DEXTROSE-NACL 20-5-0.45 MEQ/L-%-% IV SOLN
INTRAVENOUS | Status: AC
  Filled 2012-04-14: qty 1000

## 2012-04-14 MED ORDER — FLEET ENEMA 7-19 GM/118ML RE ENEM: 1.0000 | ENEMA | Freq: Once | RECTAL | Status: AC | PRN

## 2012-04-14 MED ORDER — OXYCODONE HCL 5 MG PO TABS
5.0000 mg | ORAL_TABLET | ORAL | Status: DC | PRN
  Administered 2012-04-14: 10 mg via ORAL
  Administered 2012-04-14: 5 mg via ORAL
  Filled 2012-04-14: qty 2

## 2012-04-14 MED ORDER — NEBIVOLOL HCL 5 MG PO TABS
5.0000 mg | ORAL_TABLET | Freq: Every evening | ORAL | Status: DC
  Administered 2012-04-15 – 2012-04-17 (×3): 5 mg via ORAL
  Filled 2012-04-14 (×5): qty 1

## 2012-04-14 MED ORDER — BISACODYL 5 MG PO TBEC: 5.0000 mg | DELAYED_RELEASE_TABLET | Freq: Every day | ORAL | Status: DC | PRN

## 2012-04-14 MED ORDER — OXYCODONE HCL 5 MG PO TABS
ORAL_TABLET | ORAL | Status: AC
  Filled 2012-04-14: qty 1

## 2012-04-14 MED ORDER — ONDANSETRON HCL 4 MG PO TABS: 4.0000 mg | ORAL_TABLET | Freq: Four times a day (QID) | ORAL | Status: DC | PRN

## 2012-04-14 MED ORDER — METOCLOPRAMIDE HCL 10 MG PO TABS: 5.0000 mg | ORAL_TABLET | Freq: Three times a day (TID) | ORAL | Status: DC | PRN

## 2012-04-14 MED ORDER — PHENOL 1.4 % MT LIQD: 1.0000 | OROMUCOSAL | Status: DC | PRN

## 2012-04-14 MED ORDER — METHOCARBAMOL 500 MG PO TABS
500.0000 mg | ORAL_TABLET | Freq: Four times a day (QID) | ORAL | Status: DC | PRN
  Administered 2012-04-14 – 2012-04-17 (×9): 500 mg via ORAL
  Filled 2012-04-14 (×8): qty 1

## 2012-04-14 MED ORDER — METHOCARBAMOL 100 MG/ML IJ SOLN: 500.0000 mg | Freq: Four times a day (QID) | INTRAVENOUS | Status: DC | PRN

## 2012-04-14 MED ORDER — DIAZEPAM 5 MG PO TABS: 10.0000 mg | ORAL_TABLET | ORAL | Status: DC | PRN

## 2012-04-14 MED ORDER — HYDROMORPHONE HCL PF 1 MG/ML IJ SOLN
INTRAMUSCULAR | Status: AC
  Filled 2012-04-14: qty 1

## 2012-04-14 MED ORDER — RIVAROXABAN 10 MG PO TABS
10.0000 mg | ORAL_TABLET | Freq: Every day | ORAL | Status: DC
  Administered 2012-04-15 – 2012-04-17 (×3): 10 mg via ORAL
  Filled 2012-04-14 (×5): qty 1

## 2012-04-14 MED ORDER — ACETAMINOPHEN 650 MG RE SUPP: 650.0000 mg | Freq: Four times a day (QID) | RECTAL | Status: DC | PRN

## 2012-04-14 MED ORDER — BUPIVACAINE-EPINEPHRINE (PF) 0.5% -1:200000 IJ SOLN
INTRAMUSCULAR | Status: AC
  Filled 2012-04-14: qty 10

## 2012-04-14 MED ORDER — LACTATED RINGERS IV SOLN
INTRAVENOUS | Status: DC
  Administered 2012-04-14: 11:00:00 via INTRAVENOUS

## 2012-04-14 MED ORDER — LIDOCAINE HCL (CARDIAC) 20 MG/ML IV SOLN
INTRAVENOUS | Status: DC | PRN
  Administered 2012-04-14: 100 mg via INTRAVENOUS

## 2012-04-14 MED ORDER — CETIRIZINE HCL 10 MG PO TABS
10.0000 mg | ORAL_TABLET | Freq: Every day | ORAL | Status: DC
  Administered 2012-04-14 – 2012-04-17 (×4): 10 mg via ORAL
  Filled 2012-04-14 (×4): qty 1

## 2012-04-14 MED ORDER — FENTANYL CITRATE 0.05 MG/ML IJ SOLN
INTRAMUSCULAR | Status: DC | PRN
  Administered 2012-04-14: 50 ug via INTRAVENOUS
  Administered 2012-04-14: 100 ug via INTRAVENOUS
  Administered 2012-04-14: 50 ug via INTRAVENOUS

## 2012-04-14 MED ORDER — OXYCODONE-ACETAMINOPHEN 5-325 MG PO TABS
1.0000 | ORAL_TABLET | ORAL | Status: DC | PRN
  Administered 2012-04-15 – 2012-04-17 (×7): 2 via ORAL
  Filled 2012-04-14 (×7): qty 2

## 2012-04-14 MED ORDER — ACETAMINOPHEN 325 MG PO TABS: 650.0000 mg | ORAL_TABLET | Freq: Four times a day (QID) | ORAL | Status: DC | PRN

## 2012-04-14 MED ORDER — ONDANSETRON HCL 4 MG/2ML IJ SOLN: 4.0000 mg | Freq: Once | INTRAMUSCULAR | Status: DC | PRN

## 2012-04-14 MED ORDER — TRIAMTERENE-HCTZ 37.5-25 MG PO TABS
1.0000 | ORAL_TABLET | Freq: Every day | ORAL | Status: DC
  Administered 2012-04-15 – 2012-04-17 (×3): 1 via ORAL
  Filled 2012-04-14 (×5): qty 1

## 2012-04-14 MED ORDER — METOCLOPRAMIDE HCL 5 MG/ML IJ SOLN: 5.0000 mg | Freq: Three times a day (TID) | INTRAMUSCULAR | Status: DC | PRN

## 2012-04-14 MED ORDER — HYDROMORPHONE HCL PF 1 MG/ML IJ SOLN
INTRAMUSCULAR | Status: AC
  Administered 2012-04-14: 1 mg via INTRAVENOUS
  Filled 2012-04-14: qty 1

## 2012-04-14 MED ORDER — ONDANSETRON HCL 4 MG/2ML IJ SOLN
INTRAMUSCULAR | Status: DC | PRN
  Administered 2012-04-14: 4 mg via INTRAVENOUS

## 2012-04-14 MED ORDER — VANCOMYCIN HCL IN DEXTROSE 1-5 GM/200ML-% IV SOLN
1000.0000 mg | Freq: Once | INTRAVENOUS | Status: AC
  Administered 2012-04-14: 1000 mg via INTRAVENOUS

## 2012-04-14 MED ORDER — METHOCARBAMOL 500 MG PO TABS
ORAL_TABLET | ORAL | Status: AC
  Filled 2012-04-14: qty 1

## 2012-04-14 MED ORDER — MAGNESIUM HYDROXIDE 400 MG/5ML PO SUSP: 30.0000 mL | Freq: Every day | ORAL | Status: DC | PRN

## 2012-04-14 MED ORDER — ACETAMINOPHEN 10 MG/ML IV SOLN: 1000.0000 mg | Freq: Once | INTRAVENOUS | Status: DC | PRN

## 2012-04-14 SURGICAL SUPPLY — 70 items
BOWL SMART MIX CTS (DISPOSABLE) IMPLANT
BRUSH FEMORAL CANAL (MISCELLANEOUS) IMPLANT
CLOTH BEACON ORANGE TIMEOUT ST (SAFETY) ×2 IMPLANT
COVER SURGICAL LIGHT HANDLE (MISCELLANEOUS) ×2 IMPLANT
CUP PINN GRIPTON 54 100 (Cup) ×2 IMPLANT
DRAPE C-ARM 42X72 X-RAY (DRAPES) IMPLANT
DRAPE ORTHO SPLIT 77X108 STRL (DRAPES) ×2
DRAPE PROXIMA HALF (DRAPES) ×2 IMPLANT
DRAPE SURG ORHT 6 SPLT 77X108 (DRAPES) ×1 IMPLANT
DRAPE U-SHAPE 47X51 STRL (DRAPES) ×2 IMPLANT
DRILL BIT 7/64X5 (BIT) ×2 IMPLANT
DRSG MEPILEX BORDER 4X12 (GAUZE/BANDAGES/DRESSINGS) ×2 IMPLANT
DRSG MEPILEX BORDER 4X8 (GAUZE/BANDAGES/DRESSINGS) IMPLANT
DURAPREP 26ML APPLICATOR (WOUND CARE) ×2 IMPLANT
ELECT BLADE 4.0 EZ CLEAN MEGAD (MISCELLANEOUS) ×2
ELECT BLADE 6.5 EXT (BLADE) IMPLANT
ELECT REM PT RETURN 9FT ADLT (ELECTROSURGICAL) ×2
ELECTRODE BLDE 4.0 EZ CLN MEGD (MISCELLANEOUS) ×1 IMPLANT
ELECTRODE REM PT RTRN 9FT ADLT (ELECTROSURGICAL) ×1 IMPLANT
ELIMINATOR HOLE APEX DEPUY (Hips) ×2 IMPLANT
EVACUATOR 1/8 PVC DRAIN (DRAIN) IMPLANT
GAUZE XEROFORM 5X9 LF (GAUZE/BANDAGES/DRESSINGS) ×2 IMPLANT
GLOVE BIO SURGEON STRL SZ7 (GLOVE) IMPLANT
GLOVE BIO SURGEON STRL SZ7.5 (GLOVE) IMPLANT
GLOVE BIOGEL PI IND STRL 7.0 (GLOVE) ×1 IMPLANT
GLOVE BIOGEL PI IND STRL 8 (GLOVE) ×1 IMPLANT
GLOVE BIOGEL PI INDICATOR 7.0 (GLOVE) ×1
GLOVE BIOGEL PI INDICATOR 8 (GLOVE) ×1
GOWN PREVENTION PLUS XLARGE (GOWN DISPOSABLE) ×6 IMPLANT
GOWN STRL NON-REIN LRG LVL3 (GOWN DISPOSABLE) ×4 IMPLANT
HANDPIECE INTERPULSE COAX TIP (DISPOSABLE)
HEAD CERAMIC 36 PLUS5 (Hips) ×2 IMPLANT
HEEL PROTECTOR  874200 (MISCELLANEOUS)
HEEL PROTECTOR 874200 (MISCELLANEOUS) IMPLANT
HOOD PEEL AWAY FACE SHEILD DIS (HOOD) ×6 IMPLANT
KIT BASIN OR (CUSTOM PROCEDURE TRAY) ×2 IMPLANT
KIT ROOM TURNOVER OR (KITS) ×2 IMPLANT
LINER MARATHON 10D 36X54 (Hips) ×1 IMPLANT
LINER MARATHON 10DEG 36X54 (Hips) ×1 IMPLANT
MANIFOLD NEPTUNE II (INSTRUMENTS) ×2 IMPLANT
NEEDLE 22X1 1/2 (OR ONLY) (NEEDLE) ×2 IMPLANT
NOZZLE PRISM 8.5MM (MISCELLANEOUS) IMPLANT
NS IRRIG 1000ML POUR BTL (IV SOLUTION) ×2 IMPLANT
PACK TOTAL JOINT (CUSTOM PROCEDURE TRAY) ×2 IMPLANT
PAD ARMBOARD 7.5X6 YLW CONV (MISCELLANEOUS) ×4 IMPLANT
PASSER SUT SWANSON 36MM LOOP (INSTRUMENTS) ×2 IMPLANT
PRESSURIZER FEMORAL UNIV (MISCELLANEOUS) IMPLANT
SET HNDPC FAN SPRY TIP SCT (DISPOSABLE) IMPLANT
SLEEVE SURGEON STRL (DRAPES) IMPLANT
SPONGE GAUZE 4X4 12PLY (GAUZE/BANDAGES/DRESSINGS) ×2 IMPLANT
SPONGE LAP 18X18 X RAY DECT (DISPOSABLE) ×2 IMPLANT
STAPLER VISISTAT 35W (STAPLE) ×2 IMPLANT
SUT ETHIBOND 2 V 37 (SUTURE) ×2 IMPLANT
SUT ETHILON 3 0 FSL (SUTURE) ×2 IMPLANT
SUT VIC AB 0 CTB1 27 (SUTURE) ×2 IMPLANT
SUT VIC AB 1 CTX 36 (SUTURE) ×1
SUT VIC AB 1 CTX36XBRD ANBCTR (SUTURE) ×1 IMPLANT
SUT VIC AB 2-0 CT1 27 (SUTURE) ×2
SUT VIC AB 2-0 CT1 TAPERPNT 27 (SUTURE) ×1 IMPLANT
SUT VIC AB 2-0 CTB1 (SUTURE) ×2 IMPLANT
SWAB COLLECTION DEVICE MRSA (MISCELLANEOUS) ×2 IMPLANT
SYR 20ML ECCENTRIC (SYRINGE) ×2 IMPLANT
SYR CONTROL 10ML LL (SYRINGE) ×2 IMPLANT
TOWEL OR 17X24 6PK STRL BLUE (TOWEL DISPOSABLE) ×2 IMPLANT
TOWEL OR 17X26 10 PK STRL BLUE (TOWEL DISPOSABLE) ×2 IMPLANT
TOWER CARTRIDGE SMART MIX (DISPOSABLE) IMPLANT
TRAY FOLEY BAG SILVER LF 14FR (CATHETERS) ×2 IMPLANT
TRAY FOLEY CATH 14FR (SET/KITS/TRAYS/PACK) IMPLANT
TUBE ANAEROBIC SPECIMEN COL (MISCELLANEOUS) ×2 IMPLANT
WATER STERILE IRR 1000ML POUR (IV SOLUTION) IMPLANT

## 2012-04-14 NOTE — Op Note (Signed)
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  Surgeon: Feliberto Gottron. Turner Daniels M.D.  Assistant: Shirl Harris PA-C  Estimated blood loss: 300 cc  Fluid replacement: 1500 cc of crystalloid  Complications: None  Indications: Patient with an ASR on Tri-Lock total hip that did very well until a 12 months ago when she had increasing groin pain. The pain wakes her up at night and recently got to the point where she could no longer go walking on a regular basis. MRI scan showed no fluid collection, no bony destruction. Plain x-rays show no change in the position of the components and the stem appears to be well ingrown. There is a small halo about the ASR cup which might indicate some fibrous ingrowth only. Risks and benefits of revision surgery have been discussed and questions answered.  Procedure: Patient was identified by arm band receive preoperative IV antibiotics in the holding area at, and hospital. She was then taken to the operating room where the appropriate anesthetic monitors were attached and general endotracheal anesthesia induced with the patient in the supine position. He was then rolled into the L lateral decubitus position and fixed there with a mark 2 pelvic clamp. A Foley catheter was inserted and the limb prepped and draped in usual sterile fashion from the ankle to the hemipelvis. Time out procedure performed. Skin along the lateral hip and thigh infiltrated with 10 cc of 1/2% Marcaine and epinephrine solution. We began the operation by recreating the old posterior lateral incision 15 cm in line through the skin and subcutaneous tissue down to the level of the IT band which was cut in line with the skin incision. This exposed the greater  trochanter and pseudocapsule was peeled off the intertrochanteric crest until we entered the joint. The fluid were encountered was clear and sent off for Gram stain and culture. The pseudocapsule synovial lining was excised. We then remove scar tissue from around to the ASR cup and femoral stem trunnion, dislocated a total hip and removed the ASR head with a mallet and metal cylinder. The trunnion was then tucked anterior and superior to the acetabulum and we continued to remove scar tissue from around the acetabular component. A posterior inferior wing retractor was hammered into place. And we began loosening the cup by placing a 1/4 inch osteotome between the edge of the acetabular component and the bone. We then used the short Innomed curved osteotome around the edge of the cup and at that point it came out relatively easily indicating no and growth. No bone was noticed on the ingrowth surface of the cup and fibrous tissue is then stripped from the acetabulum. We reamed up to a 53 mm basket reamer obtaining good coverage in all quadrants irrigated with normal saline solution. We then hammered into place a 54 mm Gryption cup in 45 of abduction and 20 of anteversion. A central occluder was screwed into place followed by a 10 polyethylene liner index  superior. A trial reduction was then performed with a +5 36 mm femoral head instability was noted to 90 of flexion 80 of internal rotation and in full extension the hip could not be dislocated with external rotation. At this point a real +5 36 mm ceramic head was hammered  into place, the hip reduced and irrigated with normal saline solution. The capsular flap was repaired back to the intertrochanteric crest through drill holes with #2 Ethibond suture. We then closed the IT band with running #1 Vicryl suture, the subcutaneous tissue with 0 and 2-0 undyed Vicryl suture, the skin was closed with running interlocking 3-0 nylon suture. A dressing of Mepilex was then  applied, the patient was unclamped a rolled supine awakened extubated and taken to the recovery without difficulty.

## 2012-04-14 NOTE — Anesthesia Procedure Notes (Signed)
Procedure Name: Intubation Date/Time: 04/14/2012 12:35 PM Performed by: Rossie Muskrat L Pre-anesthesia Checklist: Patient identified, Timeout performed, Emergency Drugs available, Suction available and Patient being monitored Patient Re-evaluated:Patient Re-evaluated prior to inductionOxygen Delivery Method: Circle system utilized Preoxygenation: Pre-oxygenation with 100% oxygen Intubation Type: IV induction Ventilation: Mask ventilation without difficulty Laryngoscope Size: Miller and 2 Grade View: Grade I Tube type: Oral Tube size: 7.5 mm Number of attempts: 1 Airway Equipment and Method: Stylet Placement Confirmation: ETT inserted through vocal cords under direct vision,  breath sounds checked- equal and bilateral and positive ETCO2 Secured at: 21 cm Tube secured with: Tape Dental Injury: Teeth and Oropharynx as per pre-operative assessment

## 2012-04-14 NOTE — Interval H&P Note (Signed)
History and Physical Interval Note:  04/14/2012 11:52 AM  [name]  has presented today for surgery, with the diagnosis of PAINFUL RIGHT ASR HIP  The various methods of treatment have been discussed with the patient and family. After consideration of risks, benefits and other options for treatment, the patient has consented to  Procedure(s) (LRB): TOTAL HIP REVISION (Right) as a surgical intervention .  The patient's history has been reviewed, patient examined, no change in status, stable for surgery.  I have reviewed the patients' chart and labs.  Questions were answered to the patient's satisfaction.     [name]

## 2012-04-14 NOTE — Plan of Care (Signed)
Problem: Consults Goal: Diagnosis- Total Joint Replacement Outcome: Completed/Met Date Met:  04/14/12 Revision Total Hip  Problem: Phase I Progression Outcomes Goal: Initial discharge plan identified Outcome: Completed/Met Date Met:  04/14/12 Pt states plan is for [facility] upon discharge.  Social Work consult ordered.

## 2012-04-14 NOTE — Transfer of Care (Signed)
Immediate Anesthesia Transfer of Care Note  Patient: [name]  Procedure(s) Performed: Procedure(s) (LRB): TOTAL HIP REVISION (Right)  Patient Location: PACU  Anesthesia Type: General  Level of Consciousness: awake, alert  and oriented  Airway & Oxygen Therapy: Patient Spontanous Breathing and Patient connected to nasal cannula oxygen  Post-op Assessment: Report given to PACU RN, Post -op Vital signs reviewed and stable and Patient moving all extremities  Post vital signs: Reviewed and stable  Complications: No apparent anesthesia complications

## 2012-04-14 NOTE — Anesthesia Preprocedure Evaluation (Signed)
Anesthesia Evaluation  Patient identified by MRN, date of birth, ID band Patient awake    Reviewed: Allergy & Precautions, H&P , NPO status   Airway Mallampati: II      Dental  (+) Teeth Intact and Dental Advisory Given   Pulmonary  breath sounds clear to auscultation        Cardiovascular Rhythm:Regular Rate:Normal     Neuro/Psych    GI/Hepatic   Endo/Other    Renal/GU      Musculoskeletal   Abdominal   Peds  Hematology   Anesthesia Other Findings   Reproductive/Obstetrics                           Anesthesia Physical Anesthesia Plan  ASA: III  Anesthesia Plan: General   Post-op Pain Management:    Induction: Intravenous  Airway Management Planned: Oral ETT  Additional Equipment:   Intra-op Plan:   Post-operative Plan: Extubation in OR  Informed Consent: I have reviewed the patients History and Physical, chart, labs and discussed the procedure including the risks, benefits and alternatives for the proposed anesthesia with the patient or authorized representative who has indicated his/her understanding and acceptance.   Dental advisory given  Plan Discussed with: CRNA and Surgeon  Anesthesia Plan Comments: (Htn S/P R. THR 2007 Migraines anxiety  Plan GA  Kipp Brood, MD)        Anesthesia Quick Evaluation

## 2012-04-14 NOTE — Anesthesia Postprocedure Evaluation (Signed)
Anesthesia Post Note  Patient: [name]  Procedure(s) Performed: Procedure(s) (LRB): TOTAL HIP REVISION (Right)  Anesthesia type: General  Patient location: PACU  Post pain: Pain level controlled and Adequate analgesia  Post assessment: Post-op Vital signs reviewed, Patient's Cardiovascular Status Stable, Respiratory Function Stable, Patent Airway and Pain level controlled  Last Vitals:  Filed Vitals:   04/14/12 1500  BP: 135/69  Pulse: 87  Temp:   Resp: 18    Post vital signs: Reviewed and stable  Level of consciousness: awake, alert  and oriented  Complications: No apparent anesthesia complications

## 2012-04-14 NOTE — Preoperative (Signed)
Beta Blockers   Reason not to administer Beta Blockers:Not Applicable 

## 2012-04-15 LAB — BASIC METABOLIC PANEL
Chloride: 101 mEq/L (ref 96–112)
GFR calc Af Amer: >90 mL/min (ref >90)
Potassium: 3.8 mEq/L (ref 3.5–5.1)
Sodium: 134 mEq/L — ABNORMAL LOW (ref 135–145)

## 2012-04-15 LAB — CBC
Platelets: 233 10*3/uL (ref 150–400)
RDW: 15.2 % (ref 11.5–15.5)
WBC: 7.9 10*3/uL (ref 4.0–10.5)

## 2012-04-15 NOTE — Evaluation (Signed)
Physical Therapy Evaluation Patient Details Name: [name] MRN: [id number] DOB: 09/27/1951 Today's Date: 04/15/2012 Time: 0102-7253 PT Time Calculation (min): 28 min  PT Assessment / Plan / Recommendation Clinical Impression  Pt is a 61 y/o female admitted s/p right THA along with the below PT problem list.  Pt would benefit from acute PT to maximize independence and facilitate d/c to SNF.    PT Assessment  Patient needs continued PT services    Follow Up Recommendations  Skilled nursing facility    Barriers to Discharge None      lEquipment Recommendations  Defer to next venue    Recommendations for Other Services     Frequency 7X/week    Precautions / Restrictions Precautions Precautions: Posterior Hip Precaution Booklet Issued: Yes (comment) Precaution Comments: Educated pt on 3/3 posterior hip precautions. Restrictions Weight Bearing Restrictions: Yes RLE Weight Bearing: Weight bearing as tolerated   Pertinent Vitals/Pain 7/10 in right hip.  Pt repositioned.      Mobility  Bed Mobility Bed Mobility: Supine to Sit Supine to Sit: 3: Mod assist;HOB elevated (HOB 45 degrees.) Details for Bed Mobility Assistance: Assist for right LE to keep in midline as well as for trunk to translate anterior.  Cues for sequence. Transfers Transfers: Sit to Stand;Stand to Sit Sit to Stand: 4: Min assist;With upper extremity assist;From bed Stand to Sit: 4: Min assist;With upper extremity assist;To chair/3-in-1 Details for Transfer Assistance: Assist to translate trunk anterior with cues for sequence as safest hand/right LE placement. Ambulation/Gait Ambulation/Gait Assistance: 4: Min assist Ambulation Distance (Feet): 45 Feet Assistive device: Rolling walker Ambulation/Gait Assistance Details: Assist for balance due to increased dizziness this am as well as to off weight right LE due to pain.  Cues for tall posture and sequence. Gait Pattern: Step-to pattern;Decreased step  length - right;Decreased stance time - right;Trunk flexed Stairs: No Wheelchair Mobility Wheelchair Mobility: No    Exercises Total Joint Exercises Ankle Circles/Pumps: AROM;Right;10 reps;Supine Quad Sets: AROM;Right;10 reps;Supine Heel Slides: AAROM;Right;10 reps;Supine   PT Diagnosis: Difficulty walking;Acute pain  PT Problem List: Decreased strength;Decreased range of motion;Decreased activity tolerance;Decreased balance;Decreased mobility;Decreased knowledge of use of DME;Decreased knowledge of precautions;Pain PT Treatment Interventions: DME instruction;Gait training;Functional mobility training;Therapeutic activities;Therapeutic exercise;Balance training;Patient/family education   PT Goals Acute Rehab PT Goals PT Goal Formulation: With patient Time For Goal Achievement: 04/29/12 Potential to Achieve Goals: Good Pt will go Supine/Side to Sit: with supervision PT Goal: Supine/Side to Sit - Progress: Goal set today Pt will go Sit to Supine/Side: with supervision PT Goal: Sit to Supine/Side - Progress: Goal set today Pt will go Sit to Stand: with supervision PT Goal: Sit to Stand - Progress: Goal set today Pt will go Stand to Sit: with supervision PT Goal: Stand to Sit - Progress: Goal set today Pt will Ambulate: 51 - 150 feet;with supervision;with least restrictive assistive device PT Goal: Ambulate - Progress: Goal set today Pt will Perform Home Exercise Program: with supervision, verbal cues required/provided PT Goal: Perform Home Exercise Program - Progress: Goal set today  Visit Information  Last PT Received On: 04/15/12 Assistance Needed: +1    Subjective Data  Subjective: "I am doing ok." Patient Stated Goal: Go to [facility] before home.   Prior Functioning  Home Living Lives With: Spouse Available Help at Discharge: Skilled Nursing Facility Type of Home: House Home Access: Stairs to enter [employer] of Steps: 6 Entrance Stairs-Rails: Can reach  both Home Layout: Two level Alternate Level Stairs-Number of Steps:  14 Alternate Level Stairs-Rails: Left Bathroom Shower/Tub: Tub/shower unit;Curtain Bathroom Toilet: Standard Home Adaptive Equipment: Walker - rolling;Tub transfer bench Prior Function Level of Independence: Independent Able to Take Stairs?: Yes Driving: Yes Vocation: Retired Musician: No difficulties    Cognition  Overall Cognitive Status: Appears within functional limits for tasks assessed/performed Arousal/Alertness: Awake/alert Orientation Level: Appears intact for tasks assessed Behavior During Session: [facility] for tasks performed    Extremity/Trunk Assessment Right Upper Extremity Assessment RUE ROM/Strength/Tone: Within functional levels RUE Sensation: WFL - Light Touch RUE Coordination: WFL - gross/fine motor Left Upper Extremity Assessment LUE ROM/Strength/Tone: Within functional levels LUE Sensation: WFL - Light Touch LUE Coordination: WFL - gross/fine motor Right Lower Extremity Assessment RLE ROM/Strength/Tone: Deficits;Due to pain RLE ROM/Strength/Tone Deficits: 2/5 throughout. RLE Sensation: WFL - Light Touch RLE Coordination: WFL - gross/fine motor Left Lower Extremity Assessment LLE ROM/Strength/Tone: Within functional levels LLE Sensation: WFL - Light Touch LLE Coordination: WFL - gross/fine motor Trunk Assessment Trunk Assessment: Normal   Balance Balance Balance Assessed: No  End of Session PT - End of Session Equipment Utilized During Treatment: Gait belt Activity Tolerance: Patient tolerated treatment well Patient left: in chair;with call bell/phone within reach Nurse Communication: Mobility status   Cephus Shelling 04/15/2012, 9:24 AM  04/15/2012 Cephus Shelling, PT, DPT [phone]

## 2012-04-15 NOTE — Clinical Social Work Psychosocial (Signed)
     Clinical Social Work Department BRIEF PSYCHOSOCIAL ASSESSMENT 04/15/2012  Patient:  [name], [name]     Account Number:  [id number]     Admit date:  04/14/2012  Clinical Social Worker:  Burnard Hawthorne  Date/Time:  04/15/2012 03:43 PM  Referred by:  Physician  Date Referred:  04/15/2012 Referred for  SNF Placement   Other Referral:   Interview type:  Patient Other interview type:    PSYCHOSOCIAL DATA Living Status:  HUSBAND Admitted from facility:   Level of care:   Primary support name:  Harvey Lingo  [phone] Primary support relationship to patient:  SPOUSE Degree of support available:   Very supportive.    CURRENT CONCERNS Current Concerns  Post-Acute Placement   Other Concerns:    SOCIAL WORK ASSESSMENT / PLAN CSW referred to assist patient with short term SNF placement. She has pretoured and arranged for placement at [facility] when medically stable per MD. Patient has [employer]; discussed with Al Decant, admissions- who is aware of same.  She will have a bed available for patient on 04/17/12. Patient will return home with husband once rehab is completed.   Assessment/plan status:  Psychosocial Support/Ongoing Assessment of Needs Other assessment/ plan:   Information/referral to community resources:   SNF offered and declined by patient as she has pre-chosen SNF  Discussed aftercare services when d/c'd from SNF- if needed HH/DME will be arranged by SNF.    PATIENTS/FAMILYS RESPONSE TO PLAN OF CARE: Patient is very pleased with d/c plan. she states that her husband and family fully supports this plan.

## 2012-04-15 NOTE — Progress Notes (Signed)
PT Treatment Note:   04/15/12 1254  PT Visit Information  Last PT Received On 04/15/12  Assistance Needed +1  PT Time Calculation  PT Start Time 1240  PT Stop Time 1252  PT Time Calculation (min) 12 min  Subjective Data  Subjective "I'm sore."  Patient Stated Goal Go to [facility] before home.  Precautions  Precautions Posterior Hip  Precaution Booklet Issued No  Precaution Comments Re-educated pt on 3/3 posterior hip precautions.  Restrictions  Weight Bearing Restrictions Yes  RLE Weight Bearing WBAT  Cognition  Overall Cognitive Status Appears within functional limits for tasks assessed/performed  Arousal/Alertness Awake/alert  Orientation Level Appears intact for tasks assessed  Behavior During Session [facility] for tasks performed  Bed Mobility  Bed Mobility Not assessed  Transfers  Transfers Not assessed  Ambulation/Gait  Ambulation/Gait Assistance Not tested (comment)  Stairs No  Wheelchair Mobility  Wheelchair Mobility No  Balance  Balance Assessed No  Exercises  Exercises Total Joint  Total Joint Exercises  Ankle Circles/Pumps AROM;Right;10 reps;Supine  Quad Sets AROM;Right;10 reps;Supine  Heel Slides AAROM;Right;10 reps;Supine  Short Arc Quad AROM;Right;10 reps;Supine  Hip ABduction/ADduction AAROM;Right;10 reps;Supine  PT - End of Session  Activity Tolerance Patient tolerated treatment well  Patient left in bed;with call bell/phone within reach  Nurse Communication Other (comment) (Treatment session.)  PT - Assessment/Plan  Comments on Treatment Session Pt admitted s/p right THA revision and continues to progress.  Pt limited by pain this treatment, but willing/able to perform therapeutic exercises.  PT Plan Discharge plan remains appropriate;Frequency remains appropriate  PT Frequency 7X/week  Follow Up Recommendations Skilled nursing facility  Equipment Recommended Defer to next venue  Acute Rehab PT Goals  PT Goal Formulation With patient  Time For  Goal Achievement 04/29/12  Potential to Achieve Goals Good  PT Goal: Perform Home Exercise Program - Progress Progressing toward goal  PT General Charges  $$ ACUTE PT VISIT 1 Procedure  PT Treatments  $Therapeutic Exercise 8-22 mins    Pain:  5/10 in right hip.  Pt repositioned.  04/15/2012 Cephus Shelling, PT, DPT [phone]

## 2012-04-15 NOTE — Progress Notes (Signed)
Orthopedic Tech Progress Note Patient Details:  [name] 27-Feb-1951 [id number]  Patient ID: [name], female   DOB: 19-Sep-1951, 61 y.o.   MRN: [id number] Trapeze bar  CammerMickie Bail 04/15/2012, 8:19 AM

## 2012-04-15 NOTE — Progress Notes (Signed)
CARE MANAGEMENT NOTE 04/15/2012  Patient:  [name], [name]   Account Number:  [id number]  Date Initiated:  04/15/2012  Documentation initiated by:  Vance Peper  Subjective/Objective Assessment:   61 yr old female s/p right total hip revision     Action/Plan:   Patient will go to [facility] for shortterm rehab.   Anticipated DC Date:  04/17/2012   Anticipated DC Plan:  SKILLED NURSING FACILITY  In-house referral  Clinical Social Worker      DC Planning Services  CM consult      [facility] Choice  NA   Choice offered to / List presented to:             Status of service:  Completed, signed off    Discharge Disposition:  SKILLED NURSING FACILITY

## 2012-04-15 NOTE — Progress Notes (Signed)
UR COMPLETED  

## 2012-04-15 NOTE — Clinical Social Work Placement (Addendum)
    Clinical Social Work Department CLINICAL SOCIAL WORK PLACEMENT NOTE 04/15/2012  Patient:  [name], [name]  Account Number:  [id number] Admit date:  04/14/2012  Clinical Social Worker:  Lupita Leash Marlee Trentman, BSW  Date/time:  04/15/2012 03:51 PM  Clinical Social Work is seeking post-discharge placement for this patient at the following level of care:   SKILLED NURSING   (*CSW will update this form in Epic as items are completed)     Patient/family provided with [facility] Department of Clinical Social Work's list of facilities offering this level of care within the geographic area requested by the patient (or if unable, by the patient's family).    Patient/family informed of their freedom to choose among providers that offer the needed level of care, that participate in Medicare, Medicaid or managed care program needed by the patient, have an available bed and are willing to accept the patient.    Patient/family informed of [facility]' ownership interest in [facility], as well as of the fact that they are under no obligation to receive care at this facility.  PASARR submitted to EDS on 04/15/2012 PASARR number received from EDS on 04/15/2012  FL2 transmitted to all facilities in geographic area requested by pt/family on  04/15/2012 FL2 transmitted to all facilities within larger geographic area on   Patient informed that his/her managed care company has contracts with or will negotiate with  certain facilities, including the following:   Patient has [employer].  Pre-toured and [employer]. Declined a SNF list. Has old PASARR in place.     Patient/family informed of bed offers received:  04/15/2012 Patient chooses bed at [facility] Physician recommends and patient chooses bed at  Patient to be transferred to [facility] on  04/17/2012 Patient to be transferred to facility by [facility] with husband per her request  The following physician request were entered  in Epic:   Additional Comments: Patient is pleased with d/c plan. DC was delayed until late in afternoon due to issues regarding insurance authorization.  Received call from [facility]- Admissions that patient was ok to be admitted to SNF. Her husband will transport.

## 2012-04-15 NOTE — Progress Notes (Signed)
Patient ID: [name], female   DOB: 1951-06-22, 61 y.o.   MRN: [id number] PATIENT ID: [name]  MRN: [id number]  DOB/AGE:  [birth year] / 61 y.o.  1 Day Post-Op Procedure(s) (LRB): TOTAL HIP REVISION (Right), removal of metal-on-metal ASR cup and revision to polyethylene on ceramic bearing    PROGRESS NOTE Subjective: Patient is alert, oriented, nausea times one last night no Vomiting, yes passing gas, no Bowel Movement. Taking PO well. Denies SOB, Chest or Calf Pain. Using Incentive Spirometer, PAS in place. Ambulate WBAT, total hip are cautioned Patient reports pain as 4 on 0-10 scale  .    Objective: Vital signs in last 24 hours: Filed Vitals:   04/14/12 1944 04/14/12 2230 04/15/12 0204 04/15/12 0523  BP:  103/45 103/64 104/65  Pulse:  82 78 91  Temp:  98.6 F (37 C) 99.1 F (37.3 C) 98.8 F (37.1 C)  TempSrc:      Resp:  16 18 18   Height: 5\' 1"  (1.549 m)     Weight: 75.796 kg (167 lb 1.6 oz)     SpO2:  91% 98% 93%      Intake/Output from previous day: I/O last 3 completed shifts: In: 3640 [P.O.:840; I.V.:2800] Out: 2000 [Urine:1700; Blood:300]   Intake/Output this shift:     LABORATORY DATA:  Basename 04/15/12 0525  WBC 7.9  HGB 11.2*  HCT 34.3*  PLT 233  NA 134*  K 3.8  CL 101  CO2 25  BUN 9  CREATININE 0.70  GLUCOSE 163*  GLUCAP --  INR --  CALCIUM 8.5    Examination: Neurologically intact ABD soft Neurovascular intact Sensation intact distally Intact pulses distally Dorsiflexion/Plantar flexion intact Incision: scant drainage No cellulitis present Compartment soft} XR AP&Lat of hip shows well placed\fixed THA  Assessment:   1 Day Post-Op Procedure(s) (LRB): TOTAL HIP REVISION (Right) ADDITIONAL DIAGNOSIS:    Plan: PT/OT WBAT, THA  posterior precautions  DVT Prophylaxis: SCDx72 hrs, ASA 325 mg BID x 2 weeks  DISCHARGE PLAN: Skilled Nursing Facility/Rehab  DISCHARGE NEEDS: HHPT, HHRN, CPM, Walker and 3-in-1 comode seat

## 2012-04-16 DIAGNOSIS — Z96649 Presence of unspecified artificial hip joint: Secondary | ICD-10-CM

## 2012-04-16 DIAGNOSIS — T8484XA Pain due to internal orthopedic prosthetic devices, implants and grafts, initial encounter: Secondary | ICD-10-CM

## 2012-04-16 LAB — CBC
HCT: 31.4 % — ABNORMAL LOW (ref 36.0–46.0)
Hemoglobin: 10.3 g/dL — ABNORMAL LOW (ref 12.0–15.0)
MCHC: 32.8 g/dL (ref 30.0–36.0)
MCV: 88 fL (ref 78.0–100.0)
RDW: 15.1 % (ref 11.5–15.5)
WBC: 10.8 10*3/uL — ABNORMAL HIGH (ref 4.0–10.5)

## 2012-04-16 MED ORDER — METHOCARBAMOL 500 MG PO TABS: 500.0000 mg | ORAL_TABLET | Freq: Four times a day (QID) | ORAL | Status: AC | PRN

## 2012-04-16 MED ORDER — RIVAROXABAN 10 MG PO TABS: 10.0000 mg | ORAL_TABLET | Freq: Every day | ORAL | Status: DC

## 2012-04-16 MED ORDER — OXYCODONE-ACETAMINOPHEN 5-325 MG PO TABS: 1.0000 | ORAL_TABLET | ORAL | Status: AC | PRN

## 2012-04-16 NOTE — Evaluation (Signed)
Occupational Therapy Evaluation Patient Details Name: [name] MRN: [id number] DOB: [birth year] Today's Date: 04/16/2012 Time: 4098-1191 OT Time Calculation (min): 12 min  OT Assessment / Plan / Recommendation Clinical Impression  Pt s/p Rt THA revision and presents with pain, generalized weakness and decreased I with ADLs. Pt will benefit from skilled OT in the acute setting followed by ST-SNF to increase I prior to d/c home    OT Assessment  Patient needs continued OT Services    Follow Up Recommendations  Skilled nursing facility    Barriers to Discharge      Equipment Recommendations  Defer to next venue    Recommendations for Other Services    Frequency  Min 2X/week    Precautions / Restrictions Precautions Precautions: Posterior Hip Precaution Booklet Issued: No Precaution Comments: Pt able to verbalize 1/3 hip precautions; re-educated pt on 3/3 hip precautions and generalized to functional activities Restrictions Weight Bearing Restrictions: Yes RLE Weight Bearing: Weight bearing as tolerated   Pertinent Vitals/Pain Pt reports 4/10 rt hip pain; pt premedicated and repositioned    ADL  Eating/Feeding: Performed;Independent Where Assessed - Eating/Feeding: Chair Grooming: Simulated;Min guard Where Assessed - Grooming: Supported standing Lower Body Dressing: Simulated;+1 Total assistance Where Assessed - Lower Body Dressing: Supported sit to Pharmacist, hospital: Buyer, retail Method: Sit to stand Toileting - Architect and Hygiene: Simulated;Minimal assistance Where Assessed - Engineer, mining and Hygiene: Standing Equipment Used: Gait belt;Rolling walker Transfers/Ambulation Related to ADLs: Supervision/Min guard A with RW ambulation    OT Diagnosis: Generalized weakness;Acute pain  OT Problem List: Decreased activity tolerance;Decreased range of motion;Impaired balance (sitting and/or  standing);Decreased knowledge of use of DME or AE;Decreased knowledge of precautions;Pain OT Treatment Interventions: Self-care/ADL training;DME and/or AE instruction;Therapeutic activities;Patient/family education   OT Goals Acute Rehab OT Goals OT Goal Formulation: With patient Time For Goal Achievement: 04/30/12 Potential to Achieve Goals: Good ADL Goals Pt Will Perform Grooming: with modified independence;Unsupported;Standing at sink ADL Goal: Grooming - Progress: Goal set today Pt Will Perform Upper Body Dressing: with modified independence;Sitting, chair;Sitting, bed ADL Goal: Upper Body Dressing - Progress: Goal set today Pt Will Perform Lower Body Dressing: with modified independence;with adaptive equipment;Sit to stand from bed;Sit to stand from chair ADL Goal: Lower Body Dressing - Progress: Goal set today Pt Will Transfer to Toilet: with modified independence;Ambulation;with DME;3-in-1 ADL Goal: Toilet Transfer - Progress: Goal set today Pt Will Perform Toileting - Clothing Manipulation: with modified independence;Standing ADL Goal: Toileting - Clothing Manipulation - Progress: Goal set today Additional ADL Goal #1: Pt will verbalize/generalize 3/3 posterior hip precautions during functional activities. ADL Goal: Additional Goal #1 - Progress: Goal set today  Visit Information  Last OT Received On: 04/16/12 Assistance Needed: +1    Subjective Data  Subjective: I'm feeling much better today Patient Stated Goal: Return home I'ly with husband after SNF stay   Prior Functioning  Home Living Lives With: Spouse Available Help at Discharge: Skilled Nursing Facility Type of Home: House Home Access: Stairs to enter Secretary/administrator of Steps: 6 Entrance Stairs-Rails: Can reach both Home Layout: Two level Alternate Level Stairs-Number of Steps: 14 Alternate Level Stairs-Rails: Left Bathroom Shower/Tub: Tub/shower unit;Curtain Bathroom Toilet: Standard Home Adaptive  Equipment: Walker - rolling;Tub transfer bench Prior Function Level of Independence: Independent Able to Take Stairs?: Yes Driving: Yes Vocation: Retired Musician: No difficulties Dominant Hand: Right    Cognition  Overall Cognitive Status: Appears within functional limits for tasks assessed/performed Arousal/Alertness: Awake/alert Orientation  Level: Appears intact for tasks assessed Behavior During Session: [facility] for tasks performed    Extremity/Trunk Assessment     Mobility Bed Mobility Bed Mobility: Not assessed Supine to Sit: 5: Supervision;HOB flat;With rails Transfers Sit to Stand: 5: Supervision;With upper extremity assist;From bed Stand to Sit: 5: Supervision;With upper extremity assist;To bed Details for Transfer Assistance: Verbal cues for safest hand/right LE placement.   Exercise    Balance Balance Balance Assessed: No  End of Session OT - End of Session Equipment Utilized During Treatment: Gait belt  GO     [name] 04/16/2012, 1:07 PM

## 2012-04-16 NOTE — Discharge Summary (Signed)
Patient ID: [name]: [id number] DOB/AGE: Nov 22, 1950 61 y.o.  Admit date: 04/14/2012 Discharge date: 04/17/12  Admission Diagnoses:  Active Problems:  Pain due to total hip replacement   Discharge Diagnoses:  Same  Past Medical History  Diagnosis Date  . Hypertension     takes Maxzide daily  . Heart murmur     takes Bystolic nightly  . Hyperlipidemia     takes Crestor nightly  . History of blood clots 44yrs    was on Lovenox injections and Coumadin(was only on that for short period of time)  . H/O migraine     last 24yrs ago  . Arthritis   . Joint pain   . Joint swelling   . Hx of seasonal allergies     takes Levocetirizine prn  . Diverticulosis   . H/O blood transfusion reaction 2010    hives  . Anxiety     takes Xanax prn  . Insomnia     takes Ambien prn  . History of shingles 3-69yrs ago    Surgeries: Procedure(s): TOTAL HIP REVISION on 04/14/2012   Consultants:    Discharged Condition: Improved  Hospital Course: [name] is an 61 y.o. female who was admitted 04/14/2012 for operative treatment of<principal problem not specified>. Patient has severe unremitting pain that affects sleep, daily activities, and work/hobbies. After pre-op clearance the patient was taken to the operating room on 04/14/2012 and underwent  Procedure(s): TOTAL HIP REVISION.    Patient was given perioperative antibiotics: Anti-infectives     Start     Dose/Rate Route Frequency Ordered Stop   04/14/12 1015   vancomycin (VANCOCIN) IVPB 1000 mg/200 mL premix        1,000 mg 200 mL/hr over 60 Minutes Intravenous  Once 04/14/12 1011 04/14/12 1228   04/13/12 0941   ceFAZolin (ANCEF) IVPB 2 g/50 mL premix  Status:  Discontinued        2 g 100 mL/hr over 30 Minutes Intravenous 60 min pre-op 04/13/12 0941 04/14/12 1011           Patient was given sequential compression devices, early ambulation, and chemoprophylaxis to prevent DVT.  Patient benefited maximally from  hospital stay and there were no complications.    Recent vital signs: Patient Vitals for the past 24 hrs:  BP Temp Temp src Pulse Resp SpO2  04/16/12 0645 115/57 mmHg 98.2 F (36.8 C) - 73  18  98 %  04/16/12 0635 120/72 mmHg 98.4 F (36.9 C) - 90  18  98 %  04/29/2012 2316 107/62 mmHg 98.6 F (37 C) - 100  18  97 %  2012/04/29 1327 108/55 mmHg 99.3 F (37.4 C) Oral 96  18  92 %     Recent laboratory studies:  Basename 04/16/12 0505 April 29, 2012 0525  WBC 10.8* 7.9  HGB 10.3* 11.2*  HCT 31.4* 34.3*  PLT 189 233  NA -- 134*  K -- 3.8  CL -- 101  CO2 -- 25  BUN -- 9  CREATININE -- 0.70  GLUCOSE -- 163*  INR -- --  CALCIUM -- 8.5     Discharge Medications:   Medication List  As of 04/16/2012 12:44 PM   TAKE these medications         diazepam 10 MG tablet   Commonly known as: VALIUM   Take 10 mg by mouth as needed. For anxiety      levocetirizine 5 MG tablet   Commonly known as: XYZAL  Take 5 mg by mouth daily.      methocarbamol 500 MG tablet   Commonly known as: ROBAXIN   Take 1 tablet (500 mg total) by mouth every 6 (six) hours as needed.      nebivolol 5 MG tablet   Commonly known as: BYSTOLIC   Take 5 mg by mouth every evening.      oxyCODONE-acetaminophen 5-325 MG per tablet   Commonly known as: PERCOCET   Take 1-2 tablets by mouth every 4 (four) hours as needed.      rivaroxaban 10 MG Tabs tablet   Commonly known as: XARELTO   Take 1 tablet (10 mg total) by mouth daily with breakfast.      rosuvastatin 10 MG tablet   Commonly known as: CRESTOR   Take 10 mg by mouth every evening.      triamterene-hydrochlorothiazide 37.5-25 MG per tablet   Commonly known as: MAXZIDE-25   Take 1 tablet by mouth daily.      zolpidem 10 MG tablet   Commonly known as: AMBIEN   Take 10 mg by mouth at bedtime as needed. For sleep            Diagnostic Studies: Dg Chest 2 View  04/04/2012  *RADIOLOGY REPORT*  Clinical Data: Preop  CHEST - 2 VIEW  Comparison:  08/10/2010  Findings: Cardiomediastinal silhouette is stable.  No acute infiltrate or pleural effusion.  No pulmonary edema.  Bony thorax is stable.  Post cholecystectomy surgical clips are noted in the right upper abdomen.  IMPRESSION: No active disease.  No significant change.  Original Report Authenticated By: Natasha Mead, M.D.   Dg Pelvis Portable  04/14/2012  *RADIOLOGY REPORT*  Clinical Data: Postop right total hip replacement.  PORTABLE PELVIS  Comparison: MRI 01/12/2012  Findings: Changes of bilateral hip replacement.  Normal alignment. No hardware or bony complicating feature.  Soft tissue gas noted about the right hip.  IMPRESSION: Bilateral hip replacements.  No complicating feature.  Original Report Authenticated By: Cyndie Chime, M.D.   Dg Hip Portable 1 View Right  04/14/2012  *RADIOLOGY REPORT*  Clinical Data: Postop right hip replacement.  PORTABLE RIGHT HIP - 1 VIEW  Comparison: MRI 01/12/2012  Findings: Cross-table lateral view demonstrates changes of right hip replacement.  No hardware complicating feature.  Normal alignment.  IMPRESSION: Right hip replacement.  No complicating feature.  Original Report Authenticated By: Cyndie Chime, M.D.    Disposition: 01-Home or Self Care  Discharge Orders    Future Orders Please Complete By Expires   Increase activity slowly      Walker       May shower / Bathe      Driving Restrictions      Comments:   No driving for 2 weeks.   Change dressing (specify)      Comments:   Dressing change as needed.   Call MD for:  temperature >100.4      Call MD for:  severe uncontrolled pain      Call MD for:  redness, tenderness, or signs of infection (pain, swelling, redness, odor or green/yellow discharge around incision site)      Discharge instructions      Comments:   F/U with Dr. Turner Daniels as scheduled (post-op day #14).         SignedHazle Nordmann. 04/16/2012, 12:44 PM

## 2012-04-16 NOTE — Progress Notes (Signed)
Physical Therapy Treatment Patient Details Name: TNIA ANGLADA MRN: [id number] DOB: Oct 19, 1951 Today's Date: 04/16/2012 Time: 4098-1191 PT Time Calculation (min): 23 min  PT Assessment / Plan / Recommendation Comments on Treatment Session  Pt admitted s/p right THA revision and continues to progress by increasing ambulation distance/independence this am.  Motivated to get well.    Follow Up Recommendations  Skilled nursing facility    Barriers to Discharge        Equipment Recommendations  Defer to next venue    Recommendations for Other Services    Frequency 7X/week   Plan Discharge plan remains appropriate;Frequency remains appropriate    Precautions / Restrictions Precautions Precautions: Posterior Hip Precaution Booklet Issued: No Precaution Comments: Re-educated pt on 3/3 posterior hip precautions. Restrictions Weight Bearing Restrictions: Yes RLE Weight Bearing: Weight bearing as tolerated   Pertinent Vitals/Pain n/a    Mobility  Bed Mobility Bed Mobility: Not assessed Transfers Transfers: Sit to Stand;Stand to Sit Sit to Stand: 5: Supervision;With upper extremity assist;From bed Stand to Sit: 5: Supervision;With upper extremity assist;To bed Details for Transfer Assistance: Verbal cues for safest hand/right LE placement. Ambulation/Gait Ambulation/Gait Assistance: 4: Min guard Ambulation Distance (Feet): 220 Feet Assistive device: Rolling walker Ambulation/Gait Assistance Details: Guarding for balance with cues for tall posture and safe, step-through sequence with initial contact on right heel. Gait Pattern: Step-to pattern;Decreased step length - right;Decreased stance time - right;Trunk flexed Stairs: No Wheelchair Mobility Wheelchair Mobility: No    Exercises     PT Diagnosis:    PT Problem List:   PT Treatment Interventions:     PT Goals Acute Rehab PT Goals PT Goal Formulation: With patient Time For Goal Achievement: 04/29/12 Potential to  Achieve Goals: Good PT Goal: Sit to Stand - Progress: Progressing toward goal PT Goal: Stand to Sit - Progress: Progressing toward goal PT Goal: Ambulate - Progress: Progressing toward goal  Visit Information  Last PT Received On: 04/16/12 Assistance Needed: +1    Subjective Data  Subjective: "I am so much better today." Patient Stated Goal: Go to [facility] before home.   Cognition  Overall Cognitive Status: Appears within functional limits for tasks assessed/performed Arousal/Alertness: Awake/alert Orientation Level: Appears intact for tasks assessed Behavior During Session: [facility] for tasks performed    Balance  Balance Balance Assessed: No  End of Session PT - End of Session Equipment Utilized During Treatment: Gait belt Activity Tolerance: Patient tolerated treatment well Patient left: in bed;with call bell/phone within reach (Sitting EOB.) Nurse Communication: Mobility status   GP     Cephus Shelling 04/16/2012, 10:54 AM  04/16/2012 Cephus Shelling, PT, DPT [phone]

## 2012-04-16 NOTE — Progress Notes (Signed)
PT Treatment Note:   04/16/12 1326  PT Visit Information  Last PT Received On 04/16/12  Assistance Needed +1  PT Time Calculation  PT Start Time 1322  PT Stop Time 1334  PT Time Calculation (min) 12 min  Subjective Data  Subjective "No pain!"  Patient Stated Goal Go to [facility] before home.  Precautions  Precautions Posterior Hip  Precaution Booklet Issued No  Precaution Comments Pt able to verbalize 3/3 posterior hip precautions this pm.  Restrictions  Weight Bearing Restrictions Yes  RLE Weight Bearing WBAT  Cognition  Overall Cognitive Status Appears within functional limits for tasks assessed/performed  Arousal/Alertness Awake/alert  Orientation Level Appears intact for tasks assessed  Behavior During Session [facility] for tasks performed  Bed Mobility  Bed Mobility Not assessed  Transfers  Transfers Not assessed  Ambulation/Gait  Ambulation/Gait Assistance Not tested (comment)  Stairs No  Wheelchair Mobility  Wheelchair Mobility No  Balance  Balance Assessed No  Exercises  Exercises Total Joint  Total Joint Exercises  Ankle Circles/Pumps AROM;Right;20 reps;Supine  Quad Sets AROM;Right;20 reps;Supine  Heel Slides AROM;Right;20 reps;Supine  Short Arc Quad AROM;Right;20 reps;Supine  Hip ABduction/ADduction AROM;Right;20 reps;Supine  PT - End of Session  Activity Tolerance Patient tolerated treatment well  Patient left in bed;with call bell/phone within reach  PT - Assessment/Plan  Comments on Treatment Session Pt admitted s/p right THA and continues to progress.  Pt able to tolerate pm therapeutic exercises.  Remains motivated.  PT Plan Discharge plan remains appropriate;Frequency remains appropriate  PT Frequency 7X/week  Follow Up Recommendations Skilled nursing facility  Equipment Recommended Defer to next venue  Acute Rehab PT Goals  PT Goal Formulation With patient  Time For Goal Achievement 04/29/12  Potential to Achieve Goals Good  PT Goal: Perform Home  Exercise Program - Progress Progressing toward goal  PT General Charges  $$ ACUTE PT VISIT 1 Procedure  PT Treatments  $Therapeutic Exercise 8-22 mins    Pain: n/a  04/16/2012 Cephus Shelling, PT, DPT [phone]

## 2012-04-16 NOTE — Progress Notes (Signed)
PATIENT ID: [name]  MRN: [id number]  DOB/AGE:  Jan 26, 1951 / 61 y.o.  2 Days Post-Op Procedure(s) (LRB): TOTAL HIP REVISION (Right)    PROGRESS NOTE Subjective: Patient is alert, oriented,no Nausea, no Vomiting, yes passing gas, no Bowel Movement. Taking PO well. Denies SOB, Chest or Calf Pain. Using Incentive Spirometer, PAS in place. Ambulating slowly with PT. Patient reports pain as moderate  .    Objective: Vital signs in last 24 hours: Filed Vitals:   04/15/12 1327 04/15/12 2316 04/16/12 0635 04/16/12 0645  BP: 108/55 107/62 120/72 115/57  Pulse: 96 100 90 73  Temp: 99.3 F (37.4 C) 98.6 F (37 C) 98.4 F (36.9 C) 98.2 F (36.8 C)  TempSrc: Oral     Resp: 18 18 18 18   Height:      Weight:      SpO2: 92% 97% 98% 98%      Intake/Output from previous day: I/O last 3 completed shifts: In: 2460 [P.O.:960; I.V.:1500] Out: 1300 [Urine:1300]   Intake/Output this shift:     LABORATORY DATA:  Basename 04/16/12 0505 04/15/12 0525  WBC 10.8* 7.9  HGB 10.3* 11.2*  HCT 31.4* 34.3*  PLT 189 233  NA -- 134*  K -- 3.8  CL -- 101  CO2 -- 25  BUN -- 9  CREATININE -- 0.70  GLUCOSE -- 163*  GLUCAP -- --  INR -- --  CALCIUM -- 8.5    Examination: Neurologically intact ABD soft Neurovascular intact Sensation intact distally Intact pulses distally Dorsiflexion/Plantar flexion intact Incision: scant drainage} XR AP&Lat of hip shows well placed\fixed THA  Assessment:   2 Days Post-Op Procedure(s) (LRB): TOTAL HIP REVISION (Right) ADDITIONAL DIAGNOSIS:  none  Plan: PT/OT WBAT, THA  posterior precautions  DVT Prophylaxis: SCDx72 hrs, ASA 325 mg BID x 2 weeks  DISCHARGE PLAN: Skilled Nursing Facility/Rehab ([facility] Thurs)  DISCHARGE NEEDS: HHPT, HHRN, Walker and 3-in-1 comode seat

## 2012-04-17 LAB — CBC
Hemoglobin: 10.3 g/dL — ABNORMAL LOW (ref 12.0–15.0)
MCH: 28.2 pg (ref 26.0–34.0)
MCHC: 32.6 g/dL (ref 30.0–36.0)
MCV: 86.6 fL (ref 78.0–100.0)
RBC: 3.65 MIL/uL — ABNORMAL LOW (ref 3.87–5.11)

## 2012-04-17 LAB — WOUND CULTURE: Culture: NO GROWTH

## 2012-04-17 NOTE — Progress Notes (Signed)
Patient ID: [name], female   DOB: [birth year], 61 y.o.   MRN: [id number] PATIENT ID: [name]  MRN: [id number]  DOB/AGE:  [birth year] / 60 y.o.  3 Days Post-Op Procedure(s) (LRB): TOTAL HIP REVISION (Right)    PROGRESS NOTE Subjective: Patient is alert, oriented,no Nausea, no Vomiting, yes passing gas, yes Bowel Movement. Taking PO well. Denies SOB, Chest or Calf Pain. Using Incentive Spirometer, PAS in place. Ambulate WBAT, 171ft Patient reports pain as 2 on 0-10 scale  .    Objective: Vital signs in last 24 hours: Filed Vitals:   04/16/12 0645 04/16/12 1301 04/16/12 2301 04/17/12 0709  BP: 115/57 101/85 103/67 112/65  Pulse: 73 86 95 90  Temp: 98.2 F (36.8 C) 97.9 F (36.6 C) 99 F (37.2 C) 98.6 F (37 C)  TempSrc:  Oral    Resp: 18 18 18 18   Height:      Weight:      SpO2: 98% 98% 94% 92%      Intake/Output from previous day: I/O last 3 completed shifts: In: 1200 [P.O.:1200] Out: -    Intake/Output this shift:     LABORATORY DATA:  Basename 04/17/12 0529 04/16/12 0505 04/15/12 0525  WBC 9.0 10.8* --  HGB 10.3* 10.3* --  HCT 31.6* 31.4* --  PLT 181 189 --  NA -- -- 134*  K -- -- 3.8  CL -- -- 101  CO2 -- -- 25  BUN -- -- 9  CREATININE -- -- 0.70  GLUCOSE -- -- 163*  GLUCAP -- -- --  INR -- -- --  CALCIUM -- -- 8.5    Examination: Neurologically intact ABD soft Neurovascular intact Sensation intact distally Intact pulses distally Dorsiflexion/Plantar flexion intact Incision: no drainage No cellulitis present Compartment soft} XR AP&Lat of hip shows well placed\fixed THA  Assessment:   3 Days Post-Op Procedure(s) (LRB): TOTAL HIP REVISION (Right) ADDITIONAL DIAGNOSIS:    Plan: PT/OT WBAT, THA  posterior precautions  DVT Prophylaxis: SCDx72 hrs, ASA 325 mg BID x 2 weeks  DISCHARGE PLAN: Skilled Nursing Facility/Rehab, waiting for bed at [facility] place, ready for SNF  DISCHARGE NEEDS: HHPT, HHRN, CPM, Walker and 3-in-1 comode  seat

## 2012-04-17 NOTE — Progress Notes (Signed)
Physical Therapy Treatment Patient Details Name: [name] MRN: [id number] DOB: 1951/01/04 Today's Date: 04/17/2012 Time: 1478-2956 PT Time Calculation (min): 17 min  PT Assessment / Plan / Recommendation Comments on Treatment Session  Pt admitted s/p right THA and is ready for safe d/c to SNF once medically cleared by MD.  Pt able to progress with ambulation and therapeutic exercises this am.    Follow Up Recommendations  Skilled nursing facility    Barriers to Discharge        Equipment Recommendations  Defer to next venue    Recommendations for Other Services    Frequency 7X/week   Plan Discharge plan remains appropriate;Frequency remains appropriate    Precautions / Restrictions Precautions Precautions: Posterior Hip Precaution Booklet Issued: No Precaution Comments: Pt able to verbalize 3/3 posterior hip precautions. Restrictions Weight Bearing Restrictions: Yes RLE Weight Bearing: Weight bearing as tolerated   Pertinent Vitals/Pain n/a    Mobility  Bed Mobility Bed Mobility: Not assessed Transfers Transfers: Sit to Stand;Stand to Sit (2 trials.) Sit to Stand: 5: Supervision;With upper extremity assist;From bed Stand to Sit: 5: Supervision;With upper extremity assist;To bed Details for Transfer Assistance: Verbal cues for safest hand placement. Ambulation/Gait Ambulation/Gait Assistance: 5: Supervision Ambulation Distance (Feet): 180 Feet Assistive device: Rolling walker Ambulation/Gait Assistance Details: Verbal cues for tall posture with continued step-through sequence utilizing initial contact on right heel. Gait Pattern: Step-to pattern;Decreased step length - right;Decreased stance time - right Stairs: No Wheelchair Mobility Wheelchair Mobility: No    Exercises Total Joint Exercises Hip ABduction/ADduction: AROM;Right;10 reps;Standing Long Arc Quad: AROM;Right;10 reps;Seated Knee Flexion: AROM;Right;10 reps;Standing Marching in Standing:  AROM;Right;10 reps;Standing Standing Hip Extension: AROM;Right;10 reps;Standing   PT Diagnosis:    PT Problem List:   PT Treatment Interventions:     PT Goals Acute Rehab PT Goals PT Goal Formulation: With patient Time For Goal Achievement: 04/29/12 Potential to Achieve Goals: Good PT Goal: Sit to Stand - Progress: Met PT Goal: Stand to Sit - Progress: Met PT Goal: Ambulate - Progress: Met PT Goal: Perform Home Exercise Program - Progress: Progressing toward goal  Visit Information  Last PT Received On: 04/17/12 Assistance Needed: +1    Subjective Data  Subjective: "I should be leaving today." Patient Stated Goal: Go to [facility] before home.   Cognition  Overall Cognitive Status: Appears within functional limits for tasks assessed/performed Arousal/Alertness: Awake/alert Orientation Level: Appears intact for tasks assessed Behavior During Session: [facility] for tasks performed    Balance  Balance Balance Assessed: No  End of Session PT - End of Session Equipment Utilized During Treatment: Gait belt Activity Tolerance: Patient tolerated treatment well Patient left: in bed;with call bell/phone within reach (Sitting EOB.) Nurse Communication: Mobility status   GP     Cephus Shelling 04/17/2012, 8:08 AM  04/17/2012 Cephus Shelling, PT, DPT [phone]

## 2012-04-17 NOTE — Progress Notes (Signed)
Patient discharged to [facility]. Husband will be transporting. Report called in to facility.

## 2012-04-17 NOTE — Progress Notes (Signed)
Occupational Therapy Treatment Patient Details Name: [name] MRN: [id number] DOB: [birth year] Today's Date: 04/17/2012 Time: 4098-1191 OT Time Calculation (min): 15 min  OT Assessment / Plan / Recommendation Comments on Treatment Session Pt progressing well    Follow Up Recommendations  Skilled nursing facility    Barriers to Discharge       Equipment Recommendations  Defer to next venue    Recommendations for Other Services    Frequency     Plan Discharge plan remains appropriate    Precautions / Restrictions Precautions Precautions: Posterior Hip Precaution Booklet Issued: No Precaution Comments: pt able to verbalize 3/3 hip precautions, but trouble generalizing no turning in of toes to functional activity Restrictions Weight Bearing Restrictions: Yes RLE Weight Bearing: Weight bearing as tolerated   Pertinent Vitals/Pain Pt reports 7/10- RN informed and medication requested.    ADL  Grooming: Performed;Supervision/safety Where Assessed - Grooming: Unsupported standing Toilet Transfer: Research scientist (life sciences) Method: Sit to Barista: Regular height toilet;Grab bars Toileting - Clothing Manipulation and Hygiene: Performed;Supervision/safety Where Assessed - Engineer, mining and Hygiene: Standing Transfers/Ambulation Related to ADLs: Supervision with RW ambulation ADL Comments: Pt educated on use of AE for LB dsg and bathing; pt states she used to own AE, but gave it away. Educated pt where to purchase this equipment if so desired. Also, thoroughly reviewed hip precautions and how to generalize during functional activities     OT Diagnosis:    OT Problem List:   OT Treatment Interventions:     OT Goals ADL Goals ADL Goal: Grooming - Progress: Progressing toward goals ADL Goal: Lower Body Dressing - Progress: Progressing toward goals ADL Goal: Toilet Transfer - Progress: Progressing toward goals ADL  Goal: Toileting - Clothing Manipulation - Progress: Progressing toward goals ADL Goal: Additional Goal #1 - Progress: Progressing toward goals  Visit Information  Last OT Received On: 04/17/12 Assistance Needed: +1    Subjective Data      Prior Functioning       Cognition  Overall Cognitive Status: Appears within functional limits for tasks assessed/performed Arousal/Alertness: Awake/alert Orientation Level: Appears intact for tasks assessed Behavior During Session: [facility] for tasks performed    Mobility Bed Mobility Bed Mobility: Not assessed Supine to Sit: 5: Supervision;HOB flat (with trapeze bar) Details for Bed Mobility Assistance: VC to maintain rt foot from turning in Transfers Sit to Stand: 5: Supervision;From bed Stand to Sit: 5: Supervision;To toilet;To chair/3-in-1 Details for Transfer Assistance: VC for hand placement from bed   Exercises Total Joint Exercises Hip ABduction/ADduction: AROM;Right;10 reps;Standing Long Arc Quad: AROM;Right;10 reps;Seated Knee Flexion: AROM;Right;10 reps;Standing Marching in Standing: AROM;Right;10 reps;Standing Standing Hip Extension: AROM;Right;10 reps;Standing  Balance Balance Balance Assessed: No  End of Session OT - End of Session Equipment Utilized During Treatment: Gait belt  GO     Sohaib Vereen 04/17/2012, 10:51 AM

## 2012-04-17 NOTE — Progress Notes (Signed)
PT Treatment Note:   04/17/12 1439  PT Visit Information  Last PT Received On 04/17/12  Assistance Needed +1  PT Time Calculation  PT Start Time 1430  PT Stop Time 1457  PT Time Calculation (min) 27 min  Subjective Data  Subjective "I am all ready for you."  Patient Stated Goal Go to [facility] before home.  Precautions  Precautions Posterior Hip  Precaution Booklet Issued No  Precaution Comments Able to recall 3/3 posterior hip precautions.  Restrictions  Weight Bearing Restrictions Yes  RLE Weight Bearing WBAT  Cognition  Overall Cognitive Status Appears within functional limits for tasks assessed/performed  Arousal/Alertness Awake/alert  Orientation Level Appears intact for tasks assessed  Behavior During Session [facility] for tasks performed  Bed Mobility  Bed Mobility Not assessed  Transfers  Transfers Sit to Stand;Stand to Sit (2 trials.)  Sit to Stand 6: Modified independent (Device/Increase time);From bed  Stand to Sit 6: Modified independent (Device/Increase time);To bed  Ambulation/Gait  Ambulation/Gait Assistance 5: Supervision  Ambulation Distance (Feet) 220 Feet  Assistive device Rolling walker  Ambulation/Gait Assistance Details Verbal cues for tall posture and continued step-through sequence as well as safety with RW.  Gait Pattern Step-through pattern;Decreased step length - right;Decreased stance time - right  Stairs No  Wheelchair Mobility  Wheelchair Mobility No  Balance  Balance Assessed No  PT - End of Session  Equipment Utilized During Treatment Gait belt  Activity Tolerance Patient tolerated treatment well  Patient left in bed;with call bell/phone within reach (Sitting EOB.)  Nurse Communication Mobility status  PT - Assessment/Plan  Comments on Treatment Session Pt admitted s/p right THA and is ready for safe d/c to SNF once medically cleared by MD.  Pt able to progress with ambulation again this pm.  PT Plan Discharge plan remains  appropriate;Frequency remains appropriate  PT Frequency 7X/week  Follow Up Recommendations Skilled nursing facility  Equipment Recommended Defer to next venue  Acute Rehab PT Goals  PT Goal Formulation With patient  Time For Goal Achievement 04/29/12  Potential to Achieve Goals Good  PT Goal: Sit to Stand - Progress Met  PT Goal: Stand to Sit - Progress Met  Pt will Ambulate >150 feet;with modified independence;with least restrictive assistive device  PT Goal: Ambulate - Progress Updated due to goal met  PT General Charges  $$ ACUTE PT VISIT 1 Procedure  PT Treatments  $Gait Training 8-22 mins  $Therapeutic Activity 8-22 mins    Pain:  N/a  04/17/2012 Cephus Shelling, PT, DPT [phone]

## 2012-04-18 ENCOUNTER — Encounter (HOSPITAL_COMMUNITY): Payer: Self-pay | Admitting: Orthopedic Surgery

## 2012-04-19 LAB — ANAEROBIC CULTURE

## 2012-06-11 ENCOUNTER — Encounter (HOSPITAL_BASED_OUTPATIENT_CLINIC_OR_DEPARTMENT_OTHER): Payer: Self-pay | Admitting: *Deleted

## 2012-06-11 ENCOUNTER — Emergency Department (HOSPITAL_BASED_OUTPATIENT_CLINIC_OR_DEPARTMENT_OTHER)

## 2012-06-11 ENCOUNTER — Emergency Department (HOSPITAL_BASED_OUTPATIENT_CLINIC_OR_DEPARTMENT_OTHER)
Admission: EM | Admit: 2012-06-11 | Discharge: 2012-06-11 | Disposition: A | Discharge status: Home / Self Care | Attending: Emergency Medicine | Admitting: Emergency Medicine

## 2012-06-11 DIAGNOSIS — R079 Chest pain, unspecified: Secondary | ICD-10-CM

## 2012-06-11 DIAGNOSIS — F411 Generalized anxiety disorder: Secondary | ICD-10-CM | POA: Insufficient documentation

## 2012-06-11 DIAGNOSIS — G4700 Insomnia, unspecified: Secondary | ICD-10-CM | POA: Insufficient documentation

## 2012-06-11 DIAGNOSIS — M79609 Pain in unspecified limb: Secondary | ICD-10-CM | POA: Insufficient documentation

## 2012-06-11 DIAGNOSIS — E785 Hyperlipidemia, unspecified: Secondary | ICD-10-CM | POA: Insufficient documentation

## 2012-06-11 DIAGNOSIS — M25579 Pain in unspecified ankle and joints of unspecified foot: Secondary | ICD-10-CM | POA: Insufficient documentation

## 2012-06-11 DIAGNOSIS — I10 Essential (primary) hypertension: Secondary | ICD-10-CM | POA: Insufficient documentation

## 2012-06-11 DIAGNOSIS — R011 Cardiac murmur, unspecified: Secondary | ICD-10-CM | POA: Insufficient documentation

## 2012-06-11 DIAGNOSIS — R0602 Shortness of breath: Secondary | ICD-10-CM | POA: Insufficient documentation

## 2012-06-11 DIAGNOSIS — M79606 Pain in leg, unspecified: Secondary | ICD-10-CM

## 2012-06-11 DIAGNOSIS — Z79899 Other long term (current) drug therapy: Secondary | ICD-10-CM | POA: Insufficient documentation

## 2012-06-11 LAB — CBC WITH DIFFERENTIAL/PLATELET
Basophils Absolute: 0 10*3/uL (ref 0.0–0.1)
Basophils Relative: 1 % (ref 0–1)
Eosinophils Absolute: 0 10*3/uL (ref 0.0–0.7)
Eosinophils Relative: 1 % (ref 0–5)
HCT: 37 % (ref 36.0–46.0)
MCHC: 33.5 g/dL (ref 30.0–36.0)
MCV: 82 fL (ref 78.0–100.0)
Monocytes Absolute: 0.4 10*3/uL (ref 0.1–1.0)
Platelets: 219 10*3/uL (ref 150–400)
RDW: 15.5 % (ref 11.5–15.5)

## 2012-06-11 LAB — COMPREHENSIVE METABOLIC PANEL
Albumin: 3.7 g/dL (ref 3.5–5.2)
BUN: 11 mg/dL (ref 6–23)
Calcium: 9.7 mg/dL (ref 8.4–10.5)
Creatinine, Ser: 0.7 mg/dL (ref 0.50–1.10)
GFR calc Af Amer: >90 mL/min (ref >90)
Glucose, Bld: 132 mg/dL — ABNORMAL HIGH (ref 70–99)
Potassium: 4.4 mEq/L (ref 3.5–5.1)
Total Protein: 7.1 g/dL (ref 6.0–8.3)

## 2012-06-11 LAB — PROTIME-INR: Prothrombin Time: 12.5 seconds (ref 11.6–15.2)

## 2012-06-11 LAB — TROPONIN I: Troponin I: <0.3 ng/mL (ref <0.30)

## 2012-06-11 MED ORDER — IOHEXOL 350 MG/ML SOLN
80.0000 mL | Freq: Once | INTRAVENOUS | Status: AC | PRN
  Administered 2012-06-11: 80 mL via INTRAVENOUS

## 2012-06-11 MED ORDER — MORPHINE SULFATE 4 MG/ML IJ SOLN
4.0000 mg | Freq: Once | INTRAMUSCULAR | Status: AC
  Administered 2012-06-11: 4 mg via INTRAVENOUS

## 2012-06-11 MED ORDER — MORPHINE SULFATE 4 MG/ML IJ SOLN
INTRAMUSCULAR | Status: AC
  Filled 2012-06-11: qty 1

## 2012-06-11 MED ORDER — HYDROCODONE-ACETAMINOPHEN 5-500 MG PO TABS: 1.0000 | ORAL_TABLET | Freq: Four times a day (QID) | ORAL | Status: AC | PRN

## 2012-06-11 NOTE — ED Provider Notes (Signed)
Care assumed from Dr. Rosalia Hammers at shift change awaiting ct scan for rule out of pe. The ct resulted and is negative for pe or other acute process.  She will be discharged to home with pain medications, rest, return prn if she worsens.  Geoffery Lyons, MD 06/11/12 1723

## 2012-06-11 NOTE — ED Notes (Signed)
Pt c/o bil calf pain x 3 days

## 2012-06-11 NOTE — ED Provider Notes (Signed)
History     CSN: [id number]  Arrival date & time 06/11/12  1313   First MD Initiated Contact with Patient 06/11/12 1420      Chief Complaint  Patient presents with  . Leg Pain    (Consider location/radiation/quality/duration/timing/severity/associated sxs/prior treatment) HPI  Patient complaining of bilateral ex lower extremity pain for 3 days. The pain in the left lower extremity is in the ankle and the pain in the right lower extremity is behind me. She has not have any history of trauma or swelling. She does have a history of a pulmonary embolism 3 years ago but states that she did not have a known DVT at that time. She denies any redness, trauma, bite, fever, pain with walking.  Past Medical History  Diagnosis Date  . Hypertension     takes Maxzide daily  . Heart murmur     takes Bystolic nightly  . Hyperlipidemia     takes Crestor nightly  . History of blood clots 40yrs    was on Lovenox injections and Coumadin(was only on that for short period of time)  . H/O migraine     last 40yrs ago  . Arthritis   . Joint pain   . Joint swelling   . Hx of seasonal allergies     takes Levocetirizine prn  . Diverticulosis   . H/O blood transfusion reaction 2010    hives  . Anxiety     takes Xanax prn  . Insomnia     takes Ambien prn  . History of shingles 3-67yrs ago    Past Surgical History  Procedure Date  . Breast reduction surgery 1987  . Joint replacement     bilateral hip rt in 2007 and lt in 2010  . Cholecystectomy   . Abdominal hysterectomy   . Colonoscopy   . Esophagogastroduodenoscopy   . 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 No pertinent family history.  History  Substance Use Topics  . Smoking status: Passive Smoker -- 42 years    Types: Cigarettes  . Smokeless tobacco: Not on file  . Alcohol  Use: Yes     occasionally    OB History    Grav Para Term Preterm Abortions TAB SAB Ect Mult Living                  Review of Systems  All other systems reviewed and are negative.    Allergies  Ibuprofen; Latex; and Penicillins  Home Medications   Current Outpatient Rx  Name Route Sig Dispense Refill  . DIAZEPAM 10 MG PO TABS Oral Take 10 mg by mouth as needed. For anxiety    . LEVOCETIRIZINE DIHYDROCHLORIDE 5 MG PO TABS Oral Take 5 mg by mouth daily.    . NEBIVOLOL HCL 5 MG PO TABS Oral Take 5 mg by mouth every evening.    Marland Kitchen RIVAROXABAN 10 MG PO TABS Oral Take 1 tablet (10 mg total) by mouth daily with breakfast. 30 tablet 0  . ROSUVASTATIN CALCIUM 10 MG PO TABS Oral Take 10 mg by mouth every evening.    . TRIAMTERENE-HCTZ 37.5-25 MG PO TABS Oral Take 1 tablet by mouth daily.    Marland Kitchen ZOLPIDEM TARTRATE 10 MG PO TABS Oral Take 10 mg by mouth at bedtime as needed. For sleep  BP 121/63  Pulse 78  Temp 98.1 F (36.7 C) (Oral)  Resp 16  SpO2 100%  Physical Exam  Nursing note and vitals reviewed. Constitutional: She appears well-developed and well-nourished.  HENT:  Head: Normocephalic and atraumatic.  Eyes: Conjunctivae and EOM are normal. Pupils are equal, round, and reactive to light.  Neck: Normal range of motion. Neck supple.  Cardiovascular: Normal rate, regular rhythm, normal heart sounds and intact distal pulses.   Pulmonary/Chest: Effort normal and breath sounds normal.  Abdominal: Soft. Bowel sounds are normal.  Musculoskeletal: Normal range of motion.  Neurological: She is alert.  Skin: Skin is warm and dry.  Psychiatric: She has a normal mood and affect. Thought content normal.    ED Course  Procedures (including critical care time)  Labs Reviewed  COMPREHENSIVE METABOLIC PANEL - Abnormal; Notable for the following:    Glucose, Bld 132 (*)     Total Bilirubin 0.2 (*)     All other components within normal limits  CBC WITH DIFFERENTIAL  TROPONIN I   PROTIME-INR   No results found.   No diagnosis found.    MDM    Patient with negative ultrasound and care discussed with Dr. Judd Lien.  Plan d/c if negative ct angio, ow admit.      Hilario Quarry, MD 06/11/12 [phone]

## 2012-06-24 ENCOUNTER — Other Ambulatory Visit: Payer: Self-pay | Admitting: Internal Medicine

## 2012-06-24 DIAGNOSIS — Z9889 Other specified postprocedural states: Secondary | ICD-10-CM

## 2012-06-24 DIAGNOSIS — Z1231 Encounter for screening mammogram for malignant neoplasm of breast: Secondary | ICD-10-CM

## 2012-07-09 ENCOUNTER — Ambulatory Visit

## 2012-07-31 ENCOUNTER — Ambulatory Visit
Admission: RE | Admit: 2012-07-31 | Discharge: 2012-07-31 | Disposition: A | Discharge status: Home / Self Care | Source: Ambulatory Visit | Attending: Internal Medicine | Admitting: Internal Medicine

## 2012-07-31 DIAGNOSIS — Z9889 Other specified postprocedural states: Secondary | ICD-10-CM

## 2012-07-31 DIAGNOSIS — Z1231 Encounter for screening mammogram for malignant neoplasm of breast: Secondary | ICD-10-CM

## 2012-10-06 ENCOUNTER — Encounter (HOSPITAL_BASED_OUTPATIENT_CLINIC_OR_DEPARTMENT_OTHER): Payer: Self-pay | Admitting: *Deleted

## 2012-10-06 ENCOUNTER — Emergency Department (HOSPITAL_BASED_OUTPATIENT_CLINIC_OR_DEPARTMENT_OTHER)
Admission: EM | Admit: 2012-10-06 | Discharge: 2012-10-06 | Disposition: A | Discharge status: Home / Self Care | Attending: Emergency Medicine | Admitting: Emergency Medicine

## 2012-10-06 DIAGNOSIS — F411 Generalized anxiety disorder: Secondary | ICD-10-CM | POA: Insufficient documentation

## 2012-10-06 DIAGNOSIS — L259 Unspecified contact dermatitis, unspecified cause: Secondary | ICD-10-CM

## 2012-10-06 DIAGNOSIS — I10 Essential (primary) hypertension: Secondary | ICD-10-CM | POA: Insufficient documentation

## 2012-10-06 DIAGNOSIS — J309 Allergic rhinitis, unspecified: Secondary | ICD-10-CM | POA: Insufficient documentation

## 2012-10-06 DIAGNOSIS — G4700 Insomnia, unspecified: Secondary | ICD-10-CM | POA: Insufficient documentation

## 2012-10-06 DIAGNOSIS — Z862 Personal history of diseases of the blood and blood-forming organs and certain disorders involving the immune mechanism: Secondary | ICD-10-CM | POA: Insufficient documentation

## 2012-10-06 DIAGNOSIS — Z8619 Personal history of other infectious and parasitic diseases: Secondary | ICD-10-CM | POA: Insufficient documentation

## 2012-10-06 DIAGNOSIS — Z8719 Personal history of other diseases of the digestive system: Secondary | ICD-10-CM | POA: Insufficient documentation

## 2012-10-06 DIAGNOSIS — Z79899 Other long term (current) drug therapy: Secondary | ICD-10-CM | POA: Insufficient documentation

## 2012-10-06 DIAGNOSIS — Z8739 Personal history of other diseases of the musculoskeletal system and connective tissue: Secondary | ICD-10-CM | POA: Insufficient documentation

## 2012-10-06 DIAGNOSIS — E785 Hyperlipidemia, unspecified: Secondary | ICD-10-CM | POA: Insufficient documentation

## 2012-10-06 DIAGNOSIS — R011 Cardiac murmur, unspecified: Secondary | ICD-10-CM | POA: Insufficient documentation

## 2012-10-06 DIAGNOSIS — Z8679 Personal history of other diseases of the circulatory system: Secondary | ICD-10-CM | POA: Insufficient documentation

## 2012-10-06 MED ORDER — TRIAMCINOLONE ACETONIDE 0.025 % EX OINT: TOPICAL_OINTMENT | Freq: Two times a day (BID) | CUTANEOUS | Status: DC

## 2012-10-06 NOTE — ED Notes (Signed)
Pt c/o rash to chest x 1 week, seen by PMD DX shingles given acyclovir which has been completed

## 2012-10-06 NOTE — ED Provider Notes (Signed)
History     CSN: [id number]  Arrival date & time 10/06/12  1357   First MD Initiated Contact with Patient 10/06/12 1415      Chief Complaint  Patient presents with  . Rash    (Consider location/radiation/quality/duration/timing/severity/associated sxs/prior treatment) HPI Comments: Patient presents with a rash across her chest that has been present for about 2-3 weeks now. She was diagnosed with shingles by her primary care physician for this rash and she's been taking acyclovir or pills as well as acyclovir ointment. She states that her symptoms are not improving with this. She states that the rash is itchy and also burning. It's been spreading across her chest over the last week. She denies any other rashes. She denies any swelling of her face her lips or tongue. She denies any shortness of breath. She's had allergic reactions in the past medications but she denies any new medications or new lotions or detergents.  Patient is a 61 y.o. female presenting with rash.  Rash     Past Medical History  Diagnosis Date  . Hypertension     takes Maxzide daily  . Heart murmur     takes Bystolic nightly  . Hyperlipidemia     takes Crestor nightly  . History of blood clots 47yrs    was on Lovenox injections and Coumadin(was only on that for short period of time)  . H/O migraine     last 37yrs ago  . Arthritis   . Joint pain   . Joint swelling   . Hx of seasonal allergies     takes Levocetirizine prn  . Diverticulosis   . H/O blood transfusion reaction 2010    hives  . Anxiety     takes Xanax prn  . Insomnia     takes Ambien prn  . History of shingles 3-83yrs ago    Past Surgical History  Procedure Date  . Breast reduction surgery 1987  . Joint replacement     bilateral hip rt in 2007 and lt in 2010  . Cholecystectomy   . Abdominal hysterectomy   . Colonoscopy   . Esophagogastroduodenoscopy   . 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 No pertinent family history.  History  Substance Use Topics  . Smoking status: Passive Smoke Exposure - Never Smoker -- 42 years    Types: Cigarettes  . Smokeless tobacco: Not on file  . Alcohol Use: Yes     Comment: occasionally    OB History    Grav Para Term Preterm Abortions TAB SAB Ect Mult Living                  Review of Systems  Constitutional: Negative for fever, chills, diaphoresis and fatigue.  HENT: Negative for congestion, rhinorrhea and sneezing.   Eyes: Negative.   Respiratory: Negative for cough, chest tightness and shortness of breath.   Cardiovascular: Negative for chest pain and leg swelling.  Gastrointestinal: Negative for nausea, vomiting, abdominal pain, diarrhea and blood in stool.  Genitourinary: Negative for frequency, hematuria, flank pain and difficulty urinating.  Musculoskeletal: Negative for back pain and arthralgias.  Skin: Positive for rash.  Neurological: Negative for dizziness, speech difficulty, weakness, numbness and headaches.    Allergies  Ibuprofen; Latex; and Penicillins  Home Medications   Current Outpatient Rx  Name  Route  Sig  Dispense  Refill  . DIAZEPAM 10 MG PO TABS   Oral   Take 10 mg by mouth as needed. For anxiety         . LEVOCETIRIZINE DIHYDROCHLORIDE 5 MG PO TABS   Oral   Take 5 mg by mouth daily.         . NEBIVOLOL HCL 5 MG PO TABS   Oral   Take 5 mg by mouth every evening.         . OXYCODONE-ACETAMINOPHEN 5-325 MG PO TABS   Oral   Take 1 tablet by mouth every 4 (four) hours as needed. For pain.         Marland Kitchen ROSUVASTATIN CALCIUM 10 MG PO TABS   Oral   Take 10 mg by mouth every evening.         . TRIAMCINOLONE ACETONIDE 0.025 % EX OINT   Topical   Apply topically 2 (two) times daily.   30 g   0   . TRIAMTERENE-HCTZ 37.5-25 MG PO TABS   Oral   Take 1 tablet by mouth  daily.         Marland Kitchen ZOLPIDEM TARTRATE 10 MG PO TABS   Oral   Take 10 mg by mouth at bedtime as needed. For sleep           BP 136/69  Pulse 76  Temp 98.1 F (36.7 C) (Oral)  Resp 16  Ht 5' (1.524 m)  Wt 162 lb (73.483 kg)  BMI 31.64 kg/m2  SpO2 100%  Physical Exam  Constitutional: She is oriented to person, place, and time. She appears well-developed and well-nourished.  HENT:  Head: Normocephalic and atraumatic.  Eyes: Pupils are equal, round, and reactive to light.  Neck: Normal range of motion. Neck supple.  Cardiovascular: Normal rate, regular rhythm and normal heart sounds.   Pulmonary/Chest: Effort normal and breath sounds normal. No respiratory distress. She has no wheezes. She has no rales. She exhibits no tenderness.  Abdominal: Soft. Bowel sounds are normal. There is no tenderness. There is no rebound and no guarding.  Musculoskeletal: Normal range of motion. She exhibits no edema.  Lymphadenopathy:    She has no cervical adenopathy.  Neurological: She is alert and oriented to person, place, and time.  Skin: Skin is warm and dry. Rash noted.       Patient has a raised maculopapular type rash across her chest confluent in areas. It does cross the midline. It is blanching. There's no vesicular lesions noted. No petechiae or purpura noted. No other rashes noted  Psychiatric: She has a normal mood and affect.    ED Course  Procedures (including critical care time)  Labs Reviewed - No data to display No results found.   1. Contact dermatitis       MDM  Her rash looks most consistent with a contact dermatitis. She was given prescription for triamcinolone cream. She has appointment to follow up with her primary care physician on December 24 where she can be rechecked for improvement.        Rolan Bucco, MD 10/06/12 1426

## 2013-02-04 ENCOUNTER — Encounter (HOSPITAL_BASED_OUTPATIENT_CLINIC_OR_DEPARTMENT_OTHER): Payer: Self-pay | Admitting: *Deleted

## 2013-02-04 ENCOUNTER — Emergency Department (HOSPITAL_BASED_OUTPATIENT_CLINIC_OR_DEPARTMENT_OTHER)

## 2013-02-04 ENCOUNTER — Emergency Department (HOSPITAL_BASED_OUTPATIENT_CLINIC_OR_DEPARTMENT_OTHER)
Admission: EM | Admit: 2013-02-04 | Discharge: 2013-02-04 | Disposition: A | Discharge status: Home / Self Care | Attending: Emergency Medicine | Admitting: Emergency Medicine

## 2013-02-04 DIAGNOSIS — Z79899 Other long term (current) drug therapy: Secondary | ICD-10-CM | POA: Insufficient documentation

## 2013-02-04 DIAGNOSIS — F411 Generalized anxiety disorder: Secondary | ICD-10-CM | POA: Insufficient documentation

## 2013-02-04 DIAGNOSIS — Z862 Personal history of diseases of the blood and blood-forming organs and certain disorders involving the immune mechanism: Secondary | ICD-10-CM | POA: Insufficient documentation

## 2013-02-04 DIAGNOSIS — S60229A Contusion of unspecified hand, initial encounter: Secondary | ICD-10-CM | POA: Insufficient documentation

## 2013-02-04 DIAGNOSIS — Z8739 Personal history of other diseases of the musculoskeletal system and connective tissue: Secondary | ICD-10-CM | POA: Insufficient documentation

## 2013-02-04 DIAGNOSIS — R011 Cardiac murmur, unspecified: Secondary | ICD-10-CM | POA: Insufficient documentation

## 2013-02-04 DIAGNOSIS — Z8719 Personal history of other diseases of the digestive system: Secondary | ICD-10-CM | POA: Insufficient documentation

## 2013-02-04 DIAGNOSIS — G4700 Insomnia, unspecified: Secondary | ICD-10-CM | POA: Insufficient documentation

## 2013-02-04 DIAGNOSIS — I10 Essential (primary) hypertension: Secondary | ICD-10-CM | POA: Insufficient documentation

## 2013-02-04 DIAGNOSIS — E785 Hyperlipidemia, unspecified: Secondary | ICD-10-CM | POA: Insufficient documentation

## 2013-02-04 DIAGNOSIS — Z8619 Personal history of other infectious and parasitic diseases: Secondary | ICD-10-CM | POA: Insufficient documentation

## 2013-02-04 DIAGNOSIS — Y929 Unspecified place or not applicable: Secondary | ICD-10-CM | POA: Insufficient documentation

## 2013-02-04 DIAGNOSIS — W2209XA Striking against other stationary object, initial encounter: Secondary | ICD-10-CM | POA: Insufficient documentation

## 2013-02-04 DIAGNOSIS — S60221A Contusion of right hand, initial encounter: Secondary | ICD-10-CM

## 2013-02-04 DIAGNOSIS — Y9389 Activity, other specified: Secondary | ICD-10-CM | POA: Insufficient documentation

## 2013-02-04 DIAGNOSIS — Z8679 Personal history of other diseases of the circulatory system: Secondary | ICD-10-CM | POA: Insufficient documentation

## 2013-02-04 NOTE — ED Notes (Signed)
Patient transported to X-ray 

## 2013-02-04 NOTE — ED Notes (Signed)
Pt reports right hand injury this am

## 2013-02-04 NOTE — ED Provider Notes (Signed)
Medical screening examination/treatment/procedure(s) were performed by non-physician practitioner and as supervising physician I was immediately available for consultation/collaboration.  Doug Sou, MD 02/04/13 2139

## 2013-02-04 NOTE — ED Provider Notes (Signed)
History     CSN: [id number]  Arrival date & time 02/04/13  1542   First MD Initiated Contact with Patient 02/04/13 1608      Chief Complaint  Patient presents with  . Hand Injury    (Consider location/radiation/quality/duration/timing/severity/associated sxs/prior treatment) Patient is a 62 y.o. female presenting with hand injury. The history is provided by the patient. No language interpreter was used.  Hand Injury Location:  Hand Hand location:  R hand Pain details:    Quality:  Aching   Severity:  Mild Chronicity:  New Handedness:  Right-handed Worsened by:  Nothing tried Ineffective treatments:  None tried  Pt complains of pain in her hand from hitting a wall Past Medical History  Diagnosis Date  . Hypertension     takes Maxzide daily  . Heart murmur     takes Bystolic nightly  . Hyperlipidemia     takes Crestor nightly  . History of blood clots 35yrs    was on Lovenox injections and Coumadin(was only on that for short period of time)  . H/O migraine     last 57yrs ago  . Arthritis   . Joint pain   . Joint swelling   . Hx of seasonal allergies     takes Levocetirizine prn  . Diverticulosis   . H/O blood transfusion reaction 2010    hives  . Anxiety     takes Xanax prn  . Insomnia     takes Ambien prn  . History of shingles 3-13yrs ago    Past Surgical History  Procedure Laterality Date  . Breast reduction surgery  1987  . Joint replacement      bilateral hip rt in 2007 and lt in 2010  . Cholecystectomy    . Abdominal hysterectomy    . Colonoscopy    . Esophagogastroduodenoscopy    . Total hip revision  04/14/2012    Procedure: TOTAL HIP REVISION;  Surgeon: [name], MD;  Location: [facility] OR;  Service: Orthopedics;  Laterality: Right;  right total hip revision    History reviewed. No pertinent family history.  History  Substance Use Topics  . Smoking status: Passive Smoke Exposure - Never Smoker -- 42 years    Types: Cigarettes  . Smokeless  tobacco: Not on file  . Alcohol Use: Yes     Comment: occasionally    OB History   Grav Para Term Preterm Abortions TAB SAB Ect Mult Living                  Review of Systems  All other systems reviewed and are negative.    Allergies  Ibuprofen; Latex; and Penicillins  Home Medications   Current Outpatient Rx  Name  Route  Sig  Dispense  Refill  . diazepam (VALIUM) 10 MG tablet   Oral   Take 10 mg by mouth as needed. For anxiety         . levocetirizine (XYZAL) 5 MG tablet   Oral   Take 5 mg by mouth daily.         . nebivolol (BYSTOLIC) 5 MG tablet   Oral   Take 5 mg by mouth every evening.         Marland Kitchen oxyCODONE-acetaminophen (PERCOCET/ROXICET) 5-325 MG per tablet   Oral   Take 1 tablet by mouth every 4 (four) hours as needed. For pain.         . rosuvastatin (CRESTOR) 10 MG tablet   Oral  Take 10 mg by mouth every evening.         . triamcinolone (KENALOG) 0.025 % ointment   Topical   Apply topically 2 (two) times daily.   30 g   0   . triamterene-hydrochlorothiazide (MAXZIDE-25) 37.5-25 MG per tablet   Oral   Take 1 tablet by mouth daily.         Marland Kitchen zolpidem (AMBIEN) 10 MG tablet   Oral   Take 10 mg by mouth at bedtime as needed. For sleep           BP 139/73  Pulse 64  Temp(Src) 98.1 F (36.7 C) (Oral)  Resp 16  Ht 5' (1.524 m)  Wt 162 lb (73.483 kg)  BMI 31.64 kg/m2  SpO2 100%  Physical Exam  Nursing note and vitals reviewed. Constitutional: She is oriented to person, place, and time. She appears well-developed and well-nourished.  Musculoskeletal: She exhibits tenderness.  Tender right hand at area of 4th and 5th metacarpals  Neurological: She is alert and oriented to person, place, and time. She has normal reflexes.  Skin: Skin is warm.  Psychiatric: She has a normal mood and affect.    ED Course  Procedures (including critical care time)  Labs Reviewed - No data to display Dg Hand Complete Right  02/04/2013   *RADIOLOGY REPORT*  Clinical Data: Punched wall.  Fifth metacarpal pain.  RIGHT HAND - COMPLETE 3+ VIEW  Comparison: None.  Findings: There is no evidence for an acute fracture. No subluxation or dislocation.  Joint spaces are preserved.  No worrisome lytic or sclerotic osseous abnormality.  IMPRESSION: Normal exam.   Original Report Authenticated By: Kennith Center, M.D.      No diagnosis found.    MDM   Results for orders placed during the hospital encounter of 06/11/12  CBC WITH DIFFERENTIAL      Result Value Range   WBC 4.3  4.0 - 10.5 K/uL   RBC 4.51  3.87 - 5.11 MIL/uL   Hemoglobin 12.4  12.0 - 15.0 g/dL   HCT 16.1  09.6 - 04.5 %   MCV 82.0  78.0 - 100.0 fL   MCH 27.5  26.0 - 34.0 pg   MCHC 33.5  30.0 - 36.0 g/dL   RDW 40.9  81.1 - 91.4 %   Platelets 219  150 - 400 K/uL   Neutrophils Relative 58  43 - 77 %   Neutro Abs 2.5  1.7 - 7.7 K/uL   Lymphocytes Relative 32  12 - 46 %   Lymphs Abs 1.4  0.7 - 4.0 K/uL   Monocytes Relative 9  3 - 12 %   Monocytes Absolute 0.4  0.1 - 1.0 K/uL   Eosinophils Relative 1  0 - 5 %   Eosinophils Absolute 0.0  0.0 - 0.7 K/uL   Basophils Relative 1  0 - 1 %   Basophils Absolute 0.0  0.0 - 0.1 K/uL  COMPREHENSIVE METABOLIC PANEL      Result Value Range   Sodium 142  135 - 145 mEq/L   Potassium 4.4  3.5 - 5.1 mEq/L   Chloride 105  96 - 112 mEq/L   CO2 27  19 - 32 mEq/L   Glucose, Bld 132 (*) 70 - 99 mg/dL   BUN 11  6 - 23 mg/dL   Creatinine, Ser 7.82  0.50 - 1.10 mg/dL   Calcium 9.7  8.4 - 95.6 mg/dL   Total Protein 7.1  6.0 -  8.3 g/dL   Albumin 3.7  3.5 - 5.2 g/dL   AST 20  0 - 37 U/L   ALT 12  0 - 35 U/L   Alkaline Phosphatase 88  39 - 117 U/L   Total Bilirubin 0.2 (*) 0.3 - 1.2 mg/dL   GFR calc non Af Amer >90  >90 mL/min   GFR calc Af Amer >90  >90 mL/min  TROPONIN I      Result Value Range   Troponin I <0.30  <0.30 ng/mL  PROTIME-INR      Result Value Range   Prothrombin Time 12.5  11.6 - 15.2 seconds   INR 0.91  0.00 - 1.49    Dg Hand Complete Right  02/04/2013  *RADIOLOGY REPORT*  Clinical Data: Punched wall.  Fifth metacarpal pain.  RIGHT HAND - COMPLETE 3+ VIEW  Comparison: None.  Findings: There is no evidence for an acute fracture. No subluxation or dislocation.  Joint spaces are preserved.  No worrisome lytic or sclerotic osseous abnormality.  IMPRESSION: Normal exam.   Original Report Authenticated By: Kennith Center, M.D.    Pt advised ice, tylenol for pain.  Follow up with Dr. Pearletha Forge for recheck in 1 week if pain persist        Elson Areas, [place] 02/04/13 1648

## 2013-07-07 ENCOUNTER — Encounter (HOSPITAL_COMMUNITY): Payer: Self-pay | Admitting: Emergency Medicine

## 2013-07-07 ENCOUNTER — Emergency Department (HOSPITAL_COMMUNITY)
Admission: EM | Admit: 2013-07-07 | Discharge: 2013-07-07 | Disposition: A | Discharge status: Home / Self Care | Attending: Emergency Medicine | Admitting: Emergency Medicine

## 2013-07-07 DIAGNOSIS — I10 Essential (primary) hypertension: Secondary | ICD-10-CM | POA: Insufficient documentation

## 2013-07-07 DIAGNOSIS — Z880 Allergy status to penicillin: Secondary | ICD-10-CM | POA: Insufficient documentation

## 2013-07-07 DIAGNOSIS — G4700 Insomnia, unspecified: Secondary | ICD-10-CM | POA: Insufficient documentation

## 2013-07-07 DIAGNOSIS — Z79899 Other long term (current) drug therapy: Secondary | ICD-10-CM | POA: Insufficient documentation

## 2013-07-07 DIAGNOSIS — M5410 Radiculopathy, site unspecified: Secondary | ICD-10-CM

## 2013-07-07 DIAGNOSIS — R011 Cardiac murmur, unspecified: Secondary | ICD-10-CM | POA: Insufficient documentation

## 2013-07-07 DIAGNOSIS — Z8619 Personal history of other infectious and parasitic diseases: Secondary | ICD-10-CM | POA: Insufficient documentation

## 2013-07-07 DIAGNOSIS — Z86718 Personal history of other venous thrombosis and embolism: Secondary | ICD-10-CM | POA: Insufficient documentation

## 2013-07-07 DIAGNOSIS — Z8719 Personal history of other diseases of the digestive system: Secondary | ICD-10-CM | POA: Insufficient documentation

## 2013-07-07 DIAGNOSIS — E785 Hyperlipidemia, unspecified: Secondary | ICD-10-CM | POA: Insufficient documentation

## 2013-07-07 DIAGNOSIS — M5412 Radiculopathy, cervical region: Secondary | ICD-10-CM | POA: Insufficient documentation

## 2013-07-07 DIAGNOSIS — M129 Arthropathy, unspecified: Secondary | ICD-10-CM | POA: Insufficient documentation

## 2013-07-07 DIAGNOSIS — Z91040 Latex allergy status: Secondary | ICD-10-CM | POA: Insufficient documentation

## 2013-07-07 DIAGNOSIS — F411 Generalized anxiety disorder: Secondary | ICD-10-CM | POA: Insufficient documentation

## 2013-07-07 DIAGNOSIS — R209 Unspecified disturbances of skin sensation: Secondary | ICD-10-CM | POA: Insufficient documentation

## 2013-07-07 DIAGNOSIS — M542 Cervicalgia: Secondary | ICD-10-CM | POA: Insufficient documentation

## 2013-07-07 MED ORDER — OXYCODONE-ACETAMINOPHEN 5-325 MG PO TABS: 1.0000 | ORAL_TABLET | ORAL | Status: DC | PRN

## 2013-07-07 MED ORDER — DEXAMETHASONE SODIUM PHOSPHATE 10 MG/ML IJ SOLN
10.0000 mg | Freq: Once | INTRAMUSCULAR | Status: AC
  Administered 2013-07-07: 10 mg via INTRAMUSCULAR
  Filled 2013-07-07: qty 1

## 2013-07-07 NOTE — ED Provider Notes (Signed)
CSN: [id number]     Arrival date & time 07/07/13  1322 History   First MD Initiated Contact with Patient 07/07/13 1337     Chief Complaint  Patient presents with  . Neck Pain   (Consider location/radiation/quality/duration/timing/severity/associated sxs/prior Treatment) HPI Comments: The patient is a 62 year old female, very pleasant, percent with a complaint of right-sided neck pain which has been present since Thursday. She denies any injuries or straining and has not had any change in her sleeping arrangements. She states that this pain is rather persistent, gradually worsening, and radiates into the right side of her scalp over the temporal area as well as down into her right shoulder. She has associated shooting pains into her right hand which are sharp, burning and stabbing. She also has associated intermittent numbness of this area. Nothing seems to make this better, worse with range of motion of her head and not associated with changes in vision, speech, and ability to swallow, no changes in the inability to use her upper or lower extremities and no problems with gait. There is no fevers, no chills and no anterior neck pain. She has been seen by her family doctor, treated with a muscle relaxant with only transient short-term improvement. She has also had a deep tissue massage which she states helped for a small amount of time and then the pain came back.  Patient is a 62 y.o. female presenting with neck pain. The history is provided by the patient.  Neck Pain   Past Medical History  Diagnosis Date  . Hypertension     takes Maxzide daily  . Heart murmur     takes Bystolic nightly  . Hyperlipidemia     takes Crestor nightly  . History of blood clots 31yrs    was on Lovenox injections and Coumadin(was only on that for short period of time)  . H/O migraine     last 43yrs ago  . Arthritis   . Joint pain   . Joint swelling   . Hx of seasonal allergies     takes Levocetirizine prn  .  Diverticulosis   . H/O blood transfusion reaction 2010    hives  . Anxiety     takes Xanax prn  . Insomnia     takes Ambien prn  . History of shingles 3-3yrs ago   Past Surgical History  Procedure Laterality Date  . Breast reduction surgery  1987  . Joint replacement      bilateral hip rt in 2007 and lt in 2010  . Cholecystectomy    . Abdominal hysterectomy    . Colonoscopy    . Esophagogastroduodenoscopy    . Total hip revision  04/14/2012    Procedure: TOTAL HIP REVISION;  Surgeon: [name], MD;  Location: [facility] OR;  Service: Orthopedics;  Laterality: Right;  right total hip revision   History reviewed. No pertinent family history. History  Substance Use Topics  . Smoking status: Passive Smoke Exposure - Never Smoker -- 42 years    Types: Cigarettes  . Smokeless tobacco: Not on file  . Alcohol Use: Yes     Comment: occasionally   OB History   Grav Para Term Preterm Abortions TAB SAB Ect Mult Living                 Review of Systems  HENT: Positive for neck pain.   All other systems reviewed and are negative.    Allergies  Ibuprofen; Latex; and Penicillins  Home  Medications   Current Outpatient Rx  Name  Route  Sig  Dispense  Refill  . aspirin-sod bicarb-citric acid (ALKA-SELTZER) 325 MG TBEF tablet   Oral   Take 325 mg by mouth every 6 (six) hours as needed (allergies/cold symptoms).         Marland Kitchen levocetirizine (XYZAL) 5 MG tablet   Oral   Take 5 mg by mouth daily as needed for allergies.          Marland Kitchen nebivolol (BYSTOLIC) 5 MG tablet   Oral   Take 5 mg by mouth every evening.         . rosuvastatin (CRESTOR) 10 MG tablet   Oral   Take 10 mg by mouth every evening.         . triamterene-hydrochlorothiazide (MAXZIDE-25) 37.5-25 MG per tablet   Oral   Take 1 tablet by mouth daily.         Marland Kitchen zolpidem (AMBIEN) 5 MG tablet   Oral   Take 5 mg by mouth at bedtime as needed for sleep.         Marland Kitchen oxyCODONE-acetaminophen (PERCOCET) 5-325 MG per  tablet   Oral   Take 1 tablet by mouth every 4 (four) hours as needed for pain.   20 tablet   0    BP 130/69  Pulse 83  Temp(Src) 98.2 F (36.8 C) (Oral)  Resp 16  SpO2 98% Physical Exam  Nursing note and vitals reviewed. Constitutional: She appears well-developed and well-nourished. No distress.  HENT:  Head: Normocephalic and atraumatic.  Mouth/Throat: Oropharynx is clear and moist. No oropharyngeal exudate.  Eyes: Conjunctivae and EOM are normal. Pupils are equal, round, and reactive to light. Right eye exhibits no discharge. Left eye exhibits no discharge. No scleral icterus.  Neck: Normal range of motion. Neck supple. No JVD present. No thyromegaly present.  Very supple neck, reproducible tenderness over the posterior muscles of the neck including the paraspinal muscles of the cervical spine, the trapezius but not anteriorly. There is no pain over the carotids, no bruits, no pain over the strap muscles including the sternocleidomastoid.  Cardiovascular: Normal rate, regular rhythm, normal heart sounds and intact distal pulses.  Exam reveals no gallop and no friction rub.   No murmur heard. Pulmonary/Chest: Effort normal and breath sounds normal. No respiratory distress. She has no wheezes. She has no rales.  Abdominal: Soft. Bowel sounds are normal. She exhibits no distension and no mass. There is no tenderness.  Musculoskeletal: Normal range of motion. She exhibits no edema and no tenderness.  Lymphadenopathy:    She has no cervical adenopathy.  Neurological: She is alert. Coordination normal.  Speech is clear, cranial nerves III through XII are intact, memory is intact, strength is normal in all 4 extremities including grips, sensation is intact to light touch and pinprick in all 4 extremities. Coordination as tested by finger-nose-finger is normal, no limb ataxia. Normal gait, normal reflexes at the patellar tendons bilaterally  Skin: Skin is warm and dry. No rash noted. No  erythema.  Psychiatric: She has a normal mood and affect. Her behavior is normal.    ED Course  Procedures (including critical care time) Labs Review Labs Reviewed - No data to display Imaging Review No results found.  MDM   1. Neck pain   2. Radiculopathy of arm    Specifically the patient has no signs of vascular injury, no focal neurologic deficits, no signs of DVT, normal appearing neurologic exam with no  numbness or weakness of the right upper extremity and normal function incoordination of the right upper extremity. The patient does have a distant history of a blood clot which he states occurred after she had a hip or placement, she has not been on blood thinners in some time. I will have her followup with her family doctor for an ultrasound of her arm should her symptoms continue however I suspect that as she did have a change in her neurologic symptoms with the axial load test that she may have a radiculopathy. Decadron ordered, home with medications, has NSAID allergy.  Meds given in ED:  Medications  dexamethasone (DECADRON) injection 10 mg (not administered)    New Prescriptions   OXYCODONE-ACETAMINOPHEN (PERCOCET) 5-325 MG PER TABLET    Take 1 tablet by mouth every 4 (four) hours as needed for pain.      Vida Roller, MD 07/07/13 [phone]

## 2013-07-07 NOTE — ED Notes (Signed)
Pt c/o r side neck pain x4 days.  Pt reports being seen by PCP Friday and was instructed to take muscle relaxer and get a massage.  Pt reports pain has worsened since and is starting to spread.  Pt denies injury.  Pt denies any other complaints.

## 2013-08-11 ENCOUNTER — Encounter (HOSPITAL_COMMUNITY): Payer: Self-pay | Admitting: Emergency Medicine

## 2013-08-11 ENCOUNTER — Emergency Department (HOSPITAL_COMMUNITY)
Admission: EM | Admit: 2013-08-11 | Discharge: 2013-08-11 | Disposition: A | Discharge status: Home / Self Care | Attending: Emergency Medicine | Admitting: Emergency Medicine

## 2013-08-11 DIAGNOSIS — Z8619 Personal history of other infectious and parasitic diseases: Secondary | ICD-10-CM | POA: Insufficient documentation

## 2013-08-11 DIAGNOSIS — M5030 Other cervical disc degeneration, unspecified cervical region: Secondary | ICD-10-CM | POA: Insufficient documentation

## 2013-08-11 DIAGNOSIS — Z9109 Other allergy status, other than to drugs and biological substances: Secondary | ICD-10-CM | POA: Insufficient documentation

## 2013-08-11 DIAGNOSIS — G43909 Migraine, unspecified, not intractable, without status migrainosus: Secondary | ICD-10-CM | POA: Insufficient documentation

## 2013-08-11 DIAGNOSIS — E785 Hyperlipidemia, unspecified: Secondary | ICD-10-CM | POA: Insufficient documentation

## 2013-08-11 DIAGNOSIS — Z8659 Personal history of other mental and behavioral disorders: Secondary | ICD-10-CM | POA: Insufficient documentation

## 2013-08-11 DIAGNOSIS — I10 Essential (primary) hypertension: Secondary | ICD-10-CM | POA: Insufficient documentation

## 2013-08-11 DIAGNOSIS — M129 Arthropathy, unspecified: Secondary | ICD-10-CM | POA: Insufficient documentation

## 2013-08-11 DIAGNOSIS — Z8719 Personal history of other diseases of the digestive system: Secondary | ICD-10-CM | POA: Insufficient documentation

## 2013-08-11 DIAGNOSIS — Z880 Allergy status to penicillin: Secondary | ICD-10-CM | POA: Insufficient documentation

## 2013-08-11 DIAGNOSIS — Z862 Personal history of diseases of the blood and blood-forming organs and certain disorders involving the immune mechanism: Secondary | ICD-10-CM | POA: Insufficient documentation

## 2013-08-11 DIAGNOSIS — R011 Cardiac murmur, unspecified: Secondary | ICD-10-CM | POA: Insufficient documentation

## 2013-08-11 DIAGNOSIS — M47812 Spondylosis without myelopathy or radiculopathy, cervical region: Secondary | ICD-10-CM

## 2013-08-11 DIAGNOSIS — Z79899 Other long term (current) drug therapy: Secondary | ICD-10-CM | POA: Insufficient documentation

## 2013-08-11 DIAGNOSIS — IMO0002 Reserved for concepts with insufficient information to code with codable children: Secondary | ICD-10-CM | POA: Insufficient documentation

## 2013-08-11 DIAGNOSIS — Z91040 Latex allergy status: Secondary | ICD-10-CM | POA: Insufficient documentation

## 2013-08-11 MED ORDER — HYDROMORPHONE HCL PF 2 MG/ML IJ SOLN
2.0000 mg | Freq: Once | INTRAMUSCULAR | Status: AC
  Administered 2013-08-11: 2 mg via INTRAMUSCULAR
  Filled 2013-08-11: qty 1

## 2013-08-11 MED ORDER — DIAZEPAM 10 MG PO TABS: 10.0000 mg | ORAL_TABLET | Freq: Three times a day (TID) | ORAL | Status: DC | PRN

## 2013-08-11 MED ORDER — PREDNISONE 20 MG PO TABS: 20.0000 mg | ORAL_TABLET | Freq: Two times a day (BID) | ORAL | Status: DC

## 2013-08-11 MED ORDER — ONDANSETRON 8 MG PO TBDP
8.0000 mg | ORAL_TABLET | Freq: Once | ORAL | Status: AC
  Administered 2013-08-11: 8 mg via ORAL
  Filled 2013-08-11: qty 1

## 2013-08-11 NOTE — ED Notes (Signed)
Bed: WA23 Expected date:  Expected time:  Means of arrival:  Comments: 

## 2013-08-11 NOTE — ED Notes (Signed)
Pt c/o rt sided neck pain/spasms since yesterday.  Has neck problems (arthritis).

## 2013-08-11 NOTE — ED Provider Notes (Signed)
CSN: [id number]     Arrival date & time 08/11/13  1040 History   First MD Initiated Contact with Patient 08/11/13 1108     Chief Complaint  Patient presents with  . Neck Pain   (Consider location/radiation/quality/duration/timing/severity/associated sxs/prior Treatment) HPI Comments: [name] is a 62 y.o. female who is here for evaluation of left-sided pain. The pain started several days ago. It is worse with movement, and palpation. She was here 2 months ago with right-sided neck and right arm pain. Subsequent to that she was treated by her PCP, and referred to an orthopedist. She saw the orthopedist 2 weeks ago, and he prescribed an anti-inflammatory and Flexeril. Imaging done with plain films indicated degenerative joint disease. He referred her for physical therapy. She has not begun that yet. She denies radicular pain, today. There has been no recent trauma. She denies weakness, dizziness, nausea, vomiting, fever, chills, cough, shortness of breath, or chest pain. She is using Percocet occasionally for pain with some improvement. She has plenty of Percocet at home. There are no other known modifying factors.   Patient is a 62 y.o. female presenting with neck pain. The history is provided by the patient.  Neck Pain   Past Medical History  Diagnosis Date  . Hypertension     takes Maxzide daily  . Heart murmur     takes Bystolic nightly  . Hyperlipidemia     takes Crestor nightly  . History of blood clots 26yrs    was on Lovenox injections and Coumadin(was only on that for short period of time)  . H/O migraine     last 35yrs ago  . Arthritis   . Joint pain   . Joint swelling   . Hx of seasonal allergies     takes Levocetirizine prn  . Diverticulosis   . H/O blood transfusion reaction 2010    hives  . Anxiety     takes Xanax prn  . Insomnia     takes Ambien prn  . History of shingles 3-34yrs ago   Past Surgical History  Procedure Laterality Date  . Breast reduction  surgery  1987  . Joint replacement      bilateral hip rt in 2007 and lt in 2010  . Cholecystectomy    . Abdominal hysterectomy    . Colonoscopy    . Esophagogastroduodenoscopy    . Total hip revision  04/14/2012    Procedure: TOTAL HIP REVISION;  Surgeon: [name], MD;  Location: [facility] OR;  Service: Orthopedics;  Laterality: Right;  right total hip revision   No family history on file. History  Substance Use Topics  . Smoking status: Passive Smoke Exposure - Never Smoker -- 42 years    Types: Cigarettes  . Smokeless tobacco: Not on file  . Alcohol Use: Yes     Comment: occasionally   OB History   Grav Para Term Preterm Abortions TAB SAB Ect Mult Living                 Review of Systems  Musculoskeletal: Positive for neck pain.  All other systems reviewed and are negative.    Allergies  Ibuprofen; Latex; and Penicillins  Home Medications   Current Outpatient Rx  Name  Route  Sig  Dispense  Refill  . aspirin-sod bicarb-citric acid (ALKA-SELTZER) 325 MG TBEF tablet   Oral   Take 325 mg by mouth every 6 (six) hours as needed (allergies/cold symptoms).         Marland Kitchen  cyclobenzaprine (FLEXERIL) 5 MG tablet   Oral   Take 5 mg by mouth 3 (three) times daily as needed for muscle spasms.         Marland Kitchen levocetirizine (XYZAL) 5 MG tablet   Oral   Take 5 mg by mouth daily as needed for allergies.          Marland Kitchen nebivolol (BYSTOLIC) 5 MG tablet   Oral   Take 5 mg by mouth every evening.         Marland Kitchen oxyCODONE-acetaminophen (PERCOCET) 5-325 MG per tablet   Oral   Take 1 tablet by mouth every 4 (four) hours as needed for pain.   20 tablet   0   . rosuvastatin (CRESTOR) 10 MG tablet   Oral   Take 10 mg by mouth every evening.         . triamterene-hydrochlorothiazide (MAXZIDE-25) 37.5-25 MG per tablet   Oral   Take 1 tablet by mouth daily.         Marland Kitchen zolpidem (AMBIEN) 5 MG tablet   Oral   Take 5 mg by mouth at bedtime as needed for sleep.         . diazepam  (VALIUM) 10 MG tablet   Oral   Take 1 tablet (10 mg total) by mouth every 8 (eight) hours as needed (Muscle spasm).   20 tablet   0   . predniSONE (DELTASONE) 20 MG tablet   Oral   Take 1 tablet (20 mg total) by mouth 2 (two) times daily.   10 tablet   0    BP 139/74  Pulse 79  Temp(Src) 99 F (37.2 C) (Oral)  Resp 17  Ht 5' (1.524 m)  Wt 158 lb (71.668 kg)  BMI 30.86 kg/m2  SpO2 97% Physical Exam  Nursing note and vitals reviewed. Constitutional: She is oriented to person, place, and time. She appears well-developed.  Elderly, appears older than stated age  HENT:  Head: Normocephalic and atraumatic.  Eyes: Conjunctivae and EOM are normal. Pupils are equal, round, and reactive to light.  Neck: Normal range of motion and phonation normal. Neck supple.  Cardiovascular: Normal rate, regular rhythm and intact distal pulses.   Pulmonary/Chest: Effort normal and breath sounds normal. She exhibits no tenderness.  Musculoskeletal: Normal range of motion.  Tenderness to palpation of the left lateral neck. No tenderness to palpation over the cervical spine  Neurological: She is alert and oriented to person, place, and time. She exhibits normal muscle tone.  Skin: Skin is warm and dry.  Psychiatric: She has a normal mood and affect. Her behavior is normal. Judgment and thought content normal.    ED Course  Procedures (including critical care time) Medications  HYDROmorphone (DILAUDID) injection 2 mg (2 mg Intramuscular Given 08/11/13 1144)  ondansetron (ZOFRAN-ODT) disintegrating tablet 8 mg (8 mg Oral Given 08/11/13 1144)    11:41 AM Reevaluation with update and discussion. After initial assessment and treatment, an updated evaluation reveals her pain is improved. [name]     Labs Review Labs Reviewed - No data to display Imaging Review No results found.  EKG Interpretation   None       MDM   1. Degenerative joint disease of cervical spine    Neck pain  with apparent cause of degenerative joint disease. Doubt significant cervical myelopathy, fracture, radicular disease. She stable for discharge with symptomatic treatment.  Nursing Notes Reviewed/ Care Coordinated, and agree without changes. Applicable Imaging Reviewed.  Interpretation of  Laboratory Data incorporated into ED treatment   Plan: Home Medications- prednisone, Valium; Home Treatments and Observation- rest, heat; return here if the recommended treatment, does not improve the symptoms; Recommended follow up- orthopedic followup 1 week      Flint Melter, MD 08/11/13 1258

## 2013-08-11 NOTE — ED Notes (Signed)
Bed: WA24 Expected date:  Expected time:  Means of arrival:  Comments: 

## 2013-09-15 ENCOUNTER — Other Ambulatory Visit: Payer: Self-pay

## 2013-09-15 DIAGNOSIS — Z1231 Encounter for screening mammogram for malignant neoplasm of breast: Secondary | ICD-10-CM

## 2013-09-15 DIAGNOSIS — Z9889 Other specified postprocedural states: Secondary | ICD-10-CM

## 2013-10-16 ENCOUNTER — Encounter (HOSPITAL_COMMUNITY): Payer: Self-pay | Admitting: Emergency Medicine

## 2013-10-16 ENCOUNTER — Emergency Department (HOSPITAL_COMMUNITY)
Admission: EM | Admit: 2013-10-16 | Discharge: 2013-10-16 | Disposition: A | Discharge status: Home / Self Care | Attending: Emergency Medicine | Admitting: Emergency Medicine

## 2013-10-16 ENCOUNTER — Emergency Department (HOSPITAL_COMMUNITY)

## 2013-10-16 DIAGNOSIS — L02619 Cutaneous abscess of unspecified foot: Secondary | ICD-10-CM | POA: Insufficient documentation

## 2013-10-16 DIAGNOSIS — Z8619 Personal history of other infectious and parasitic diseases: Secondary | ICD-10-CM | POA: Insufficient documentation

## 2013-10-16 DIAGNOSIS — M129 Arthropathy, unspecified: Secondary | ICD-10-CM | POA: Insufficient documentation

## 2013-10-16 DIAGNOSIS — Z91040 Latex allergy status: Secondary | ICD-10-CM | POA: Insufficient documentation

## 2013-10-16 DIAGNOSIS — Z792 Long term (current) use of antibiotics: Secondary | ICD-10-CM | POA: Insufficient documentation

## 2013-10-16 DIAGNOSIS — L0390 Cellulitis, unspecified: Secondary | ICD-10-CM

## 2013-10-16 DIAGNOSIS — I10 Essential (primary) hypertension: Secondary | ICD-10-CM | POA: Insufficient documentation

## 2013-10-16 DIAGNOSIS — M25579 Pain in unspecified ankle and joints of unspecified foot: Secondary | ICD-10-CM | POA: Insufficient documentation

## 2013-10-16 DIAGNOSIS — Z86718 Personal history of other venous thrombosis and embolism: Secondary | ICD-10-CM | POA: Insufficient documentation

## 2013-10-16 DIAGNOSIS — Z8719 Personal history of other diseases of the digestive system: Secondary | ICD-10-CM | POA: Insufficient documentation

## 2013-10-16 DIAGNOSIS — R011 Cardiac murmur, unspecified: Secondary | ICD-10-CM | POA: Insufficient documentation

## 2013-10-16 DIAGNOSIS — Z880 Allergy status to penicillin: Secondary | ICD-10-CM | POA: Insufficient documentation

## 2013-10-16 DIAGNOSIS — E785 Hyperlipidemia, unspecified: Secondary | ICD-10-CM | POA: Insufficient documentation

## 2013-10-16 DIAGNOSIS — M79671 Pain in right foot: Secondary | ICD-10-CM

## 2013-10-16 DIAGNOSIS — G4700 Insomnia, unspecified: Secondary | ICD-10-CM | POA: Insufficient documentation

## 2013-10-16 DIAGNOSIS — F411 Generalized anxiety disorder: Secondary | ICD-10-CM | POA: Insufficient documentation

## 2013-10-16 DIAGNOSIS — Z79899 Other long term (current) drug therapy: Secondary | ICD-10-CM | POA: Insufficient documentation

## 2013-10-16 MED ORDER — CLINDAMYCIN HCL 300 MG PO CAPS: 300.0000 mg | ORAL_CAPSULE | Freq: Four times a day (QID) | ORAL | Status: DC

## 2013-10-16 MED ORDER — SULFAMETHOXAZOLE-TRIMETHOPRIM 800-160 MG PO TABS: 1.0000 | ORAL_TABLET | Freq: Two times a day (BID) | ORAL | Status: DC

## 2013-10-16 NOTE — ED Notes (Signed)
Pt noticed pain top of right foot starting last night, this am foot swollen and red, denies injury to foot, states not a diabetic

## 2013-10-16 NOTE — ED Provider Notes (Signed)
CSN: [id number]     Arrival date & time 10/16/13  4098 History   First MD Initiated Contact with Patient 10/16/13 1013     Chief Complaint  Patient presents with  . Foot Pain   (Consider location/radiation/quality/duration/timing/severity/associated sxs/prior Treatment) HPI Comments: Patient is a 62 year old female who presents to the emergency department complaining of right foot pain beginning last night. Patient states she tried rubbing icy hot without relief. Overnight her foot began to swell and turn slightly red. Denies any injury or trauma. Pain worse when walking. Denies fevers.  Patient is a 62 y.o. female presenting with lower extremity pain. The history is provided by the patient.  Foot Pain    Past Medical History  Diagnosis Date  . Hypertension     takes Maxzide daily  . Heart murmur     takes Bystolic nightly  . Hyperlipidemia     takes Crestor nightly  . History of blood clots 44yrs    was on Lovenox injections and Coumadin(was only on that for short period of time)  . H/O migraine     last 36yrs ago  . Arthritis   . Joint pain   . Joint swelling   . Hx of seasonal allergies     takes Levocetirizine prn  . Diverticulosis   . H/O blood transfusion reaction 2010    hives  . Anxiety     takes Xanax prn  . Insomnia     takes Ambien prn  . History of shingles 3-60yrs ago   Past Surgical History  Procedure Laterality Date  . Breast reduction surgery  1987  . Joint replacement      bilateral hip rt in 2007 and lt in 2010  . Cholecystectomy    . Abdominal hysterectomy    . Colonoscopy    . Esophagogastroduodenoscopy    . Total hip revision  04/14/2012    Procedure: TOTAL HIP REVISION;  Surgeon: [name], MD;  Location: [facility] OR;  Service: Orthopedics;  Laterality: Right;  right total hip revision   History reviewed. No pertinent family history. History  Substance Use Topics  . Smoking status: Passive Smoke Exposure - Never Smoker -- 42 years    Types:  Cigarettes  . Smokeless tobacco: Not on file  . Alcohol Use: Yes     Comment: occasionally   OB History   Grav Para Term Preterm Abortions TAB SAB Ect Mult Living                 Review of Systems  Musculoskeletal:       Positive for right foot pain and swelling.  Skin: Positive for color change.  All other systems reviewed and are negative.    Allergies  Ibuprofen; Latex; and Penicillins  Home Medications   Current Outpatient Rx  Name  Route  Sig  Dispense  Refill  . aspirin-sod bicarb-citric acid (ALKA-SELTZER) 325 MG TBEF tablet   Oral   Take 325 mg by mouth every 6 (six) hours as needed (allergies/cold symptoms).         Marland Kitchen levocetirizine (XYZAL) 5 MG tablet   Oral   Take 5 mg by mouth daily as needed for allergies.          Marland Kitchen nebivolol (BYSTOLIC) 5 MG tablet   Oral   Take 5 mg by mouth every evening.         . rosuvastatin (CRESTOR) 10 MG tablet   Oral   Take 10 mg by mouth  every evening.         . triamterene-hydrochlorothiazide (MAXZIDE-25) 37.5-25 MG per tablet   Oral   Take 1 tablet by mouth daily.         Marland Kitchen zolpidem (AMBIEN) 5 MG tablet   Oral   Take 5 mg by mouth at bedtime as needed for sleep.         . clindamycin (CLEOCIN) 300 MG capsule   Oral   Take 1 capsule (300 mg total) by mouth 4 (four) times daily. X 7 days   28 capsule   0   . sulfamethoxazole-trimethoprim (SEPTRA DS) 800-160 MG per tablet   Oral   Take 1 tablet by mouth 2 (two) times daily.   14 tablet   0    BP 123/73  Pulse 86  Temp(Src) 98.6 F (37 C) (Oral)  Resp 12  SpO2 97% Physical Exam  Nursing note and vitals reviewed. Constitutional: She is oriented to person, place, and time. She appears well-developed and well-nourished. No distress.  HENT:  Head: Normocephalic and atraumatic.  Mouth/Throat: Oropharynx is clear and moist.  Eyes: Conjunctivae are normal.  Neck: Normal range of motion. Neck supple.  Cardiovascular: Normal rate, regular rhythm,  normal heart sounds and intact distal pulses.   +2 PT/DP pulse on right.  Pulmonary/Chest: Effort normal and breath sounds normal.  Musculoskeletal:  TTP over 2-4 metatarsals near MTPs with overlying swelling and redness about 5 cm diameter, no significant temperature change. Full ROM of ankle and foot, pain with ROM of toes to metatarsals.  Neurological: She is alert and oriented to person, place, and time.  Skin: Skin is warm and dry. She is not diaphoretic.  Psychiatric: She has a normal mood and affect. Her behavior is normal.    ED Course  Procedures (including critical care time) Labs Review Labs Reviewed - No data to display Imaging Review Dg Foot Complete Right  10/16/2013   CLINICAL DATA:  Swelling, redness  EXAM: RIGHT FOOT COMPLETE - 3+ VIEW  COMPARISON:  None.  FINDINGS: Four views of the right foot submitted. No acute fracture or subluxation. Mild hallux valgus deformity. Soft tissue swelling metatarsal region.  IMPRESSION: No acute fracture or subluxation. Soft tissue swelling metatarsal region. Mild hallux valgus deformity.   Electronically Signed   By: Natasha Mead M.D.   On: 10/16/2013 10:39    EKG Interpretation   None       MDM   1. Foot pain, right   2. Cellulitis    Pt presenting with foot pain, no injury. She is not diabetic, no hx of gout. Well appearing, NAD, normal VS. Xray without acute findings other than soft tissue swelling. Doubt gout, erythema no localized to joint. No entry wound noted. Will treat for cellulitis. Skin markings drawn. F/u with PCP. Return precautions given. Patient states understanding of treatment care plan and is agreeable.     Trevor Mace, PA-C 10/16/13 [address] PA-C 10/16/13 1102

## 2013-10-17 ENCOUNTER — Emergency Department (HOSPITAL_COMMUNITY)
Admission: EM | Admit: 2013-10-17 | Discharge: 2013-10-17 | Disposition: A | Discharge status: Home / Self Care | Attending: Emergency Medicine | Admitting: Emergency Medicine

## 2013-10-17 ENCOUNTER — Encounter (HOSPITAL_COMMUNITY): Payer: Self-pay | Admitting: Emergency Medicine

## 2013-10-17 DIAGNOSIS — R21 Rash and other nonspecific skin eruption: Secondary | ICD-10-CM | POA: Insufficient documentation

## 2013-10-17 DIAGNOSIS — L02619 Cutaneous abscess of unspecified foot: Secondary | ICD-10-CM | POA: Insufficient documentation

## 2013-10-17 DIAGNOSIS — Z8619 Personal history of other infectious and parasitic diseases: Secondary | ICD-10-CM | POA: Insufficient documentation

## 2013-10-17 DIAGNOSIS — Z8739 Personal history of other diseases of the musculoskeletal system and connective tissue: Secondary | ICD-10-CM | POA: Insufficient documentation

## 2013-10-17 DIAGNOSIS — IMO0001 Reserved for inherently not codable concepts without codable children: Secondary | ICD-10-CM | POA: Insufficient documentation

## 2013-10-17 DIAGNOSIS — Z79899 Other long term (current) drug therapy: Secondary | ICD-10-CM | POA: Insufficient documentation

## 2013-10-17 DIAGNOSIS — L0390 Cellulitis, unspecified: Secondary | ICD-10-CM

## 2013-10-17 DIAGNOSIS — Z91040 Latex allergy status: Secondary | ICD-10-CM | POA: Insufficient documentation

## 2013-10-17 DIAGNOSIS — G4700 Insomnia, unspecified: Secondary | ICD-10-CM | POA: Insufficient documentation

## 2013-10-17 DIAGNOSIS — Z792 Long term (current) use of antibiotics: Secondary | ICD-10-CM | POA: Insufficient documentation

## 2013-10-17 DIAGNOSIS — E785 Hyperlipidemia, unspecified: Secondary | ICD-10-CM | POA: Insufficient documentation

## 2013-10-17 DIAGNOSIS — Z8719 Personal history of other diseases of the digestive system: Secondary | ICD-10-CM | POA: Insufficient documentation

## 2013-10-17 DIAGNOSIS — Z86718 Personal history of other venous thrombosis and embolism: Secondary | ICD-10-CM | POA: Insufficient documentation

## 2013-10-17 DIAGNOSIS — I10 Essential (primary) hypertension: Secondary | ICD-10-CM | POA: Insufficient documentation

## 2013-10-17 DIAGNOSIS — R011 Cardiac murmur, unspecified: Secondary | ICD-10-CM | POA: Insufficient documentation

## 2013-10-17 DIAGNOSIS — Z880 Allergy status to penicillin: Secondary | ICD-10-CM | POA: Insufficient documentation

## 2013-10-17 DIAGNOSIS — M2559 Pain in other specified joint: Secondary | ICD-10-CM | POA: Insufficient documentation

## 2013-10-17 DIAGNOSIS — F411 Generalized anxiety disorder: Secondary | ICD-10-CM | POA: Insufficient documentation

## 2013-10-17 LAB — POCT I-STAT, CHEM 8
BUN: 24 mg/dL — ABNORMAL HIGH (ref 6–23)
Creatinine, Ser: 1.1 mg/dL (ref 0.50–1.10)
Hemoglobin: 16.3 g/dL — ABNORMAL HIGH (ref 12.0–15.0)
Potassium: 3.8 mEq/L (ref 3.5–5.1)
Sodium: 139 mEq/L (ref 135–145)
TCO2: 26 mmol/L (ref 0–100)

## 2013-10-17 LAB — CBC WITH DIFFERENTIAL/PLATELET
Basophils Absolute: 0 10*3/uL (ref 0.0–0.1)
Eosinophils Absolute: 0 10*3/uL (ref 0.0–0.7)
Eosinophils Relative: 0 % (ref 0–5)
Lymphocytes Relative: 27 % (ref 12–46)
Lymphs Abs: 2.2 10*3/uL (ref 0.7–4.0)
MCV: 86.4 fL (ref 78.0–100.0)
Neutrophils Relative %: 63 % (ref 43–77)
Platelets: 189 10*3/uL (ref 150–400)
RBC: 4.79 MIL/uL (ref 3.87–5.11)
RDW: 14.7 % (ref 11.5–15.5)
WBC: 8.3 10*3/uL (ref 4.0–10.5)

## 2013-10-17 MED ORDER — HYDROMORPHONE HCL 2 MG PO TABS: 2.0000 mg | ORAL_TABLET | ORAL | Status: DC | PRN

## 2013-10-17 MED ORDER — HYDROMORPHONE HCL PF 1 MG/ML IJ SOLN
1.0000 mg | Freq: Once | INTRAMUSCULAR | Status: AC
  Administered 2013-10-17: 1 mg via INTRAMUSCULAR
  Filled 2013-10-17: qty 1

## 2013-10-17 NOTE — ED Notes (Signed)
Pt states that she was seen yesterday.  C/o foot pain/swelling since christmas.  Was told to come back if it didn't feel any better or if she thought it was getting worse.

## 2013-10-17 NOTE — ED Provider Notes (Signed)
CSN: [id number]     Arrival date & time 10/17/13  1342 History   First MD Initiated Contact with Patient 10/17/13 1618     Chief Complaint  Patient presents with  . Foot Pain  . Cellulitis   (Consider location/radiation/quality/duration/timing/severity/associated sxs/prior Treatment) HPI Comments: Patient here after having been seen yesterday for similar complaints - states that she was diagnosed with right foot cellulitis - states that she is here because of continued pain.  She has tried her home pain medication of percocet without relief in the pain.  She reports a constant throbbing pain, reports that the redness has increased outside the drawn line, but denies streaking up her leg.  She also denies drainage from the area.  She denies fever, chills, nausea, vomiting, inability to keep down medications.  She reports getting medication filled and has been taking as instructed  Patient is a 62 y.o. female presenting with lower extremity pain. The history is provided by the patient. No language interpreter was used.  Foot Pain This is a recurrent problem. The current episode started in the past 7 days. The problem occurs constantly. The problem has been unchanged. Associated symptoms include arthralgias, myalgias and a rash. Pertinent negatives include no abdominal pain, chest pain, chills, coughing, diaphoresis, fever, headaches, nausea, neck pain, numbness, sore throat, visual change, vomiting or weakness. The symptoms are aggravated by walking. She has tried nothing for the symptoms. The treatment provided no relief.    Past Medical History  Diagnosis Date  . Hypertension     takes Maxzide daily  . Heart murmur     takes Bystolic nightly  . Hyperlipidemia     takes Crestor nightly  . History of blood clots 70yrs    was on Lovenox injections and Coumadin(was only on that for short period of time)  . H/O migraine     last 29yrs ago  . Arthritis   . Joint pain   . Joint swelling   . Hx  of seasonal allergies     takes Levocetirizine prn  . Diverticulosis   . H/O blood transfusion reaction 2010    hives  . Anxiety     takes Xanax prn  . Insomnia     takes Ambien prn  . History of shingles 3-85yrs ago   Past Surgical History  Procedure Laterality Date  . Breast reduction surgery  1987  . Joint replacement      bilateral hip rt in 2007 and lt in 2010  . Cholecystectomy    . Abdominal hysterectomy    . Colonoscopy    . Esophagogastroduodenoscopy    . Total hip revision  04/14/2012    Procedure: TOTAL HIP REVISION;  Surgeon: [name], MD;  Location: [facility] OR;  Service: Orthopedics;  Laterality: Right;  right total hip revision   No family history on file. History  Substance Use Topics  . Smoking status: Passive Smoke Exposure - Never Smoker -- 42 years    Types: Cigarettes  . Smokeless tobacco: Not on file  . Alcohol Use: Yes     Comment: occasionally   OB History   Grav Para Term Preterm Abortions TAB SAB Ect Mult Living                 Review of Systems  Constitutional: Negative for fever, chills and diaphoresis.  HENT: Negative for sore throat.   Respiratory: Negative for cough.   Cardiovascular: Negative for chest pain.  Gastrointestinal: Negative for nausea, vomiting  and abdominal pain.  Musculoskeletal: Positive for arthralgias and myalgias. Negative for neck pain.  Skin: Positive for rash.  Neurological: Negative for weakness, numbness and headaches.  All other systems reviewed and are negative.    Allergies  Ibuprofen; Latex; and Penicillins  Home Medications   Current Outpatient Rx  Name  Route  Sig  Dispense  Refill  . aspirin-sod bicarb-citric acid (ALKA-SELTZER) 325 MG TBEF tablet   Oral   Take 325 mg by mouth every 6 (six) hours as needed (allergies/cold symptoms).         . clindamycin (CLEOCIN) 300 MG capsule   Oral   Take 1 capsule (300 mg total) by mouth 4 (four) times daily. X 7 days   28 capsule   0   .  levocetirizine (XYZAL) 5 MG tablet   Oral   Take 5 mg by mouth daily as needed for allergies.          Marland Kitchen nebivolol (BYSTOLIC) 5 MG tablet   Oral   Take 5 mg by mouth every evening.         Marland Kitchen oxyCODONE-acetaminophen (PERCOCET/ROXICET) 5-325 MG per tablet   Oral   Take 1 tablet by mouth every 4 (four) hours as needed for moderate pain or severe pain.         . rosuvastatin (CRESTOR) 10 MG tablet   Oral   Take 10 mg by mouth every evening.         . sulfamethoxazole-trimethoprim (SEPTRA DS) 800-160 MG per tablet   Oral   Take 1 tablet by mouth 2 (two) times daily.   14 tablet   0   . triamterene-hydrochlorothiazide (MAXZIDE-25) 37.5-25 MG per tablet   Oral   Take 1 tablet by mouth daily.         Marland Kitchen zolpidem (AMBIEN) 5 MG tablet   Oral   Take 5 mg by mouth at bedtime as needed for sleep.          BP 116/71  Pulse 92  Temp(Src) 99.7 F (37.6 C) (Oral)  Resp 20  SpO2 98% Physical Exam  Nursing note and vitals reviewed. Constitutional: She is oriented to person, place, and time. She appears well-developed and well-nourished. No distress.  HENT:  Head: Normocephalic and atraumatic.  Right Ear: External ear normal.  Left Ear: External ear normal.  Nose: Nose normal.  Mouth/Throat: Oropharynx is clear and moist. No oropharyngeal exudate.  Eyes: Conjunctivae are normal. Pupils are equal, round, and reactive to light. No scleral icterus.  Neck: Normal range of motion. Neck supple.  Cardiovascular: Normal rate, regular rhythm and normal heart sounds.  Exam reveals no gallop and no friction rub.   No murmur heard. Pulmonary/Chest: Effort normal and breath sounds normal. No respiratory distress. She has no wheezes. She has no rales. She exhibits no tenderness.  Abdominal: Soft. Bowel sounds are normal. She exhibits no distension. There is no tenderness.  Musculoskeletal: Normal range of motion. She exhibits edema and tenderness.  Dorsum of Right foot with 5cm x 7 cm  area of induration and erythema without fluctuance, small interval increase in erythema from yesterday, good sensation distal, FROM, capillary refill < 3 seconds, 2+ DP, PT pulses  Lymphadenopathy:    She has no cervical adenopathy.  Neurological: She is alert and oriented to person, place, and time. She exhibits normal muscle tone. Coordination normal.  Skin: Skin is warm and dry. No rash noted. There is erythema. No pallor.  Psychiatric: She has a normal  mood and affect. Her behavior is normal. Judgment and thought content normal.    ED Course  Procedures (including critical care time) Labs Review Labs Reviewed  CBC WITH DIFFERENTIAL   Imaging Review Dg Foot Complete Right  10/16/2013   CLINICAL DATA:  Swelling, redness  EXAM: RIGHT FOOT COMPLETE - 3+ VIEW  COMPARISON:  None.  FINDINGS: Four views of the right foot submitted. No acute fracture or subluxation. Mild hallux valgus deformity. Soft tissue swelling metatarsal region.  IMPRESSION: No acute fracture or subluxation. Soft tissue swelling metatarsal region. Mild hallux valgus deformity.   Electronically Signed   By: Natasha Mead M.D.   On: 10/16/2013 10:39    EKG Interpretation   None      Results for orders placed during the hospital encounter of 10/17/13  CBC WITH DIFFERENTIAL      Result Value Range   WBC 8.3  4.0 - 10.5 K/uL   RBC 4.79  3.87 - 5.11 MIL/uL   Hemoglobin 14.0  12.0 - 15.0 g/dL   HCT 16.1  09.6 - 04.5 %   MCV 86.4  78.0 - 100.0 fL   MCH 29.2  26.0 - 34.0 pg   MCHC 33.8  30.0 - 36.0 g/dL   RDW 40.9  81.1 - 91.4 %   Platelets 189  150 - 400 K/uL   Neutrophils Relative % 63  43 - 77 %   Neutro Abs 5.2  1.7 - 7.7 K/uL   Lymphocytes Relative 27  12 - 46 %   Lymphs Abs 2.2  0.7 - 4.0 K/uL   Monocytes Relative 10  3 - 12 %   Monocytes Absolute 0.8  0.1 - 1.0 K/uL   Eosinophils Relative 0  0 - 5 %   Eosinophils Absolute 0.0  0.0 - 0.7 K/uL   Basophils Relative 0  0 - 1 %   Basophils Absolute 0.0  0.0 - 0.1  K/uL  POCT I-STAT, CHEM 8      Result Value Range   Sodium 139  135 - 145 mEq/L   Potassium 3.8  3.5 - 5.1 mEq/L   Chloride 100  96 - 112 mEq/L   BUN 24 (*) 6 - 23 mg/dL   Creatinine, Ser 7.82  0.50 - 1.10 mg/dL   Glucose, Bld 956 (*) 70 - 99 mg/dL   Calcium, Ion 2.13  0.86 - 1.30 mmol/L   TCO2 26  0 - 100 mmol/L   Hemoglobin 16.3 (*) 12.0 - 15.0 g/dL   HCT 57.8 (*) 46.9 - 62.9 %   Dg Foot Complete Right  10/16/2013   CLINICAL DATA:  Swelling, redness  EXAM: RIGHT FOOT COMPLETE - 3+ VIEW  COMPARISON:  None.  FINDINGS: Four views of the right foot submitted. No acute fracture or subluxation. Mild hallux valgus deformity. Soft tissue swelling metatarsal region.  IMPRESSION: No acute fracture or subluxation. Soft tissue swelling metatarsal region. Mild hallux valgus deformity.   Electronically Signed   By: Natasha Mead M.D.   On: 10/16/2013 10:39      MDM  Cellulitis  No systemic signs of infection, afebrile, mild interval increase in erythema, pain better controlled with dilaudid IM, plan to change pain medication for home x 2 days.    Izola Price Marisue Humble, PA-C 10/17/13 1810

## 2013-10-17 NOTE — ED Notes (Signed)
Pt states she has more pain and swelling to R foot. Pt states now the swelling is in the bottom of her foot. Pt c/o worsening pain with ambulation to foot. Pt has redness, swelling to foot. Area warm to touch.

## 2013-10-18 NOTE — ED Provider Notes (Signed)
Medical screening examination/treatment/procedure(s) were performed by non-physician practitioner and as supervising physician I was immediately available for consultation/collaboration.  EKG Interpretation   None         Megan E Docherty, MD 10/18/13 1242 

## 2013-10-22 NOTE — ED Provider Notes (Signed)
Medical screening examination/treatment/procedure(s) were performed by non-physician practitioner and as supervising physician I was immediately available for consultation/collaboration.  EKG Interpretation   None         Wandra Arthurs, MD 10/22/13 1452

## 2013-10-23 ENCOUNTER — Ambulatory Visit
Admission: RE | Admit: 2013-10-23 | Discharge: 2013-10-23 | Disposition: A | Discharge status: Home / Self Care | Source: Ambulatory Visit

## 2013-10-23 DIAGNOSIS — Z9889 Other specified postprocedural states: Secondary | ICD-10-CM

## 2013-10-23 DIAGNOSIS — Z1231 Encounter for screening mammogram for malignant neoplasm of breast: Secondary | ICD-10-CM

## 2013-11-20 ENCOUNTER — Other Ambulatory Visit: Payer: Self-pay | Admitting: Orthopedic Surgery

## 2013-11-20 ENCOUNTER — Encounter (HOSPITAL_COMMUNITY): Payer: Self-pay | Admitting: Pharmacy Technician

## 2013-11-26 ENCOUNTER — Ambulatory Visit (HOSPITAL_COMMUNITY)
Admission: RE | Admit: 2013-11-26 | Discharge: 2013-11-26 | Disposition: A | Discharge status: Home / Self Care | Payer: Self-pay | Source: Ambulatory Visit | Attending: Orthopedic Surgery | Admitting: Orthopedic Surgery

## 2013-11-26 ENCOUNTER — Encounter (HOSPITAL_COMMUNITY): Payer: Self-pay

## 2013-11-26 ENCOUNTER — Encounter (HOSPITAL_COMMUNITY)
Admission: RE | Admit: 2013-11-26 | Discharge: 2013-11-26 | Disposition: A | Discharge status: Home / Self Care | Payer: Self-pay | Source: Ambulatory Visit | Attending: Orthopedic Surgery | Admitting: Orthopedic Surgery

## 2013-11-26 DIAGNOSIS — M47814 Spondylosis without myelopathy or radiculopathy, thoracic region: Secondary | ICD-10-CM | POA: Insufficient documentation

## 2013-11-26 DIAGNOSIS — R9431 Abnormal electrocardiogram [ECG] [EKG]: Secondary | ICD-10-CM | POA: Insufficient documentation

## 2013-11-26 DIAGNOSIS — Z01812 Encounter for preprocedural laboratory examination: Secondary | ICD-10-CM | POA: Insufficient documentation

## 2013-11-26 DIAGNOSIS — Z01810 Encounter for preprocedural cardiovascular examination: Secondary | ICD-10-CM | POA: Insufficient documentation

## 2013-11-26 DIAGNOSIS — Y831 Surgical operation with implant of artificial internal device as the cause of abnormal reaction of the patient, or of later complication, without mention of misadventure at the time of the procedure: Secondary | ICD-10-CM | POA: Insufficient documentation

## 2013-11-26 DIAGNOSIS — T8489XA Other specified complication of internal orthopedic prosthetic devices, implants and grafts, initial encounter: Secondary | ICD-10-CM | POA: Insufficient documentation

## 2013-11-26 DIAGNOSIS — G43909 Migraine, unspecified, not intractable, without status migrainosus: Secondary | ICD-10-CM | POA: Insufficient documentation

## 2013-11-26 DIAGNOSIS — E785 Hyperlipidemia, unspecified: Secondary | ICD-10-CM | POA: Insufficient documentation

## 2013-11-26 DIAGNOSIS — Z96649 Presence of unspecified artificial hip joint: Secondary | ICD-10-CM | POA: Insufficient documentation

## 2013-11-26 DIAGNOSIS — I10 Essential (primary) hypertension: Secondary | ICD-10-CM | POA: Insufficient documentation

## 2013-11-26 HISTORY — DX: Headache: R51

## 2013-11-26 LAB — CBC WITH DIFFERENTIAL/PLATELET
Basophils Absolute: 0 10*3/uL (ref 0.0–0.1)
Basophils Relative: 0 % (ref 0–1)
Eosinophils Absolute: 0 10*3/uL (ref 0.0–0.7)
Eosinophils Relative: 1 % (ref 0–5)
HCT: 41.3 % (ref 36.0–46.0)
HEMOGLOBIN: 13.7 g/dL (ref 12.0–15.0)
LYMPHS ABS: 2 10*3/uL (ref 0.7–4.0)
LYMPHS PCT: 29 % (ref 12–46)
MCH: 29.2 pg (ref 26.0–34.0)
MCHC: 33.2 g/dL (ref 30.0–36.0)
MCV: 88.1 fL (ref 78.0–100.0)
MONOS PCT: 7 % (ref 3–12)
Monocytes Absolute: 0.5 10*3/uL (ref 0.1–1.0)
NEUTROS PCT: 63 % (ref 43–77)
Neutro Abs: 4.4 10*3/uL (ref 1.7–7.7)
PLATELETS: 200 10*3/uL (ref 150–400)
RBC: 4.69 MIL/uL (ref 3.87–5.11)
RDW: 16 % — ABNORMAL HIGH (ref 11.5–15.5)
WBC: 6.9 10*3/uL (ref 4.0–10.5)

## 2013-11-26 LAB — URINALYSIS, ROUTINE W REFLEX MICROSCOPIC
Bilirubin Urine: NEGATIVE
Glucose, UA: NEGATIVE mg/dL
Hgb urine dipstick: NEGATIVE
KETONES UR: NEGATIVE mg/dL
LEUKOCYTES UA: NEGATIVE
NITRITE: NEGATIVE
PH: 5 (ref 5.0–8.0)
Protein, ur: NEGATIVE mg/dL
Specific Gravity, Urine: 1.021 (ref 1.005–1.030)
UROBILINOGEN UA: 0.2 mg/dL (ref 0.0–1.0)

## 2013-11-26 LAB — BASIC METABOLIC PANEL
BUN: 22 mg/dL (ref 6–23)
CALCIUM: 9.2 mg/dL (ref 8.4–10.5)
CO2: 24 mEq/L (ref 19–32)
Chloride: 103 mEq/L (ref 96–112)
Creatinine, Ser: 0.69 mg/dL (ref 0.50–1.10)
GFR calc Af Amer: >90 mL/min (ref >90)
GFR calc non Af Amer: >90 mL/min (ref >90)
GLUCOSE: 94 mg/dL (ref 70–99)
POTASSIUM: 4.4 meq/L (ref 3.7–5.3)
SODIUM: 142 meq/L (ref 137–147)

## 2013-11-26 LAB — PROTIME-INR
INR: 0.89 (ref 0.00–1.49)
Prothrombin Time: 11.9 seconds (ref 11.6–15.2)

## 2013-11-26 LAB — TYPE AND SCREEN
ABO/RH(D): O POS
ANTIBODY SCREEN: NEGATIVE

## 2013-11-26 LAB — SURGICAL PCR SCREEN
MRSA, PCR: NEGATIVE
Staphylococcus aureus: NEGATIVE

## 2013-11-26 LAB — APTT: APTT: 25 s (ref 24–37)

## 2013-11-26 NOTE — Pre-Procedure Instructions (Signed)
[name]  11/26/2013   Your procedure is scheduled on:  Monday February 9 th at 0730 AM  Report to [facility] Main Entrance "A" at 0530 AM.  Call this number if you have problems the morning of surgery: [phone]   Remember:   Do not eat food or drink liquids after midnight Sunday.   Take these medicines the morning of surgery with A SIP OF WATER: Nebivolol (Bystolic), and Hydromorphone if needed for pain   Do not wear jewelry, make-up or nail polish.  Do not wear lotions, powders, or perfumes. You may wear deodorant.  Do not shave 48 hours prior to surgery.   Do not bring valuables to the hospital.  [facility] is not responsible  for any belongings or valuables.               Contacts, dentures or bridgework may not be worn into surgery.  Leave suitcase in the car. After surgery it may be brought to your room.  For patients admitted to the hospital, discharge time is determined by your treatment team.               Patients discharged the day of surgery will not be allowed to drive home.    Special Instructions: [facility] - Preparing for Surgery  Before surgery, you can play an important role.  Because skin is not sterile, your skin needs to be as free of germs as possible.  You can reduce the number of germs on you skin by washing with CHG (chlorahexidine gluconate) soap before surgery.  CHG is an antiseptic cleaner which kills germs and bonds with the skin to continue killing germs even after washing.  Please DO NOT use if you have an allergy to CHG or antibacterial soaps.  If your skin becomes reddened/irritated stop using the CHG and inform your nurse when you arrive at Short Stay.  Do not shave (including legs and underarms) for at least 48 hours prior to the first CHG shower.  You may shave your face.  Please follow these instructions carefully:   1.  Shower with CHG Soap the night before surgery and the morning of Surgery.  2.  If you choose to wash  your hair, wash your hair first as usual with your normal shampoo.  3.  After you shampoo, rinse your hair and body thoroughly to remove the  Shampoo.  4.  Use CHG as you would any other liquid soap.  You can apply chg directly  to the skin and wash gently with scrungie or a clean washcloth.  5.  Apply the CHG Soap to your body ONLY FROM THE NECK DOWN.  Do not use on open wounds or open sores.  Avoid contact with your eyes,       ears, mouth and genitals (private parts).  Wash genitals (private parts) with your normal soap.  6.  Wash thoroughly, paying special attention to the area where your surgery will be performed.  7.  Thoroughly rinse your body with warm water from the neck down.  8.  DO NOT shower/wash with your normal soap after using and rinsing off the CHG Soap.  9.  Pat yourself dry with a clean towel.            10.  Wear clean pajamas.            11 .  Place clean sheets on your bed the night of your first shower and do  not sleep with pets.  Day of Surgery  Do not apply any lotions/deoderants the morning of surgery.  Please wear clean clothes to the hospital/surgery center.      Please read over the following fact sheets that you were given: Pain Booklet, Coughing and Deep Breathing, Blood Transfusion Information, MRSA Information and Surgical Site Infection Prevention

## 2013-11-27 NOTE — H&P (Signed)
TOTAL HIP REVISION ADMISSION H&P  Patient is admitted for left revision total hip arthroplasty.  Subjective:  Chief Complaint: left hip pain  HPI: [name], 63 y.o. female, has a history of pain and functional disability in the left hip due to arthritis and patient has failed non-surgical conservative treatments for greater than 12 weeks to include NSAID's and/or analgesics, flexibility and strengthening excercises and activity modification. The indications for the revision total hip arthroplasty are fracture or mechanical failure of one or more component and bearing surface wear leading to  symptomatic synovitis.  Onset of symptoms was gradual starting several years ago with gradually worsening course since that time.  Prior procedures on the left hip include arthroplasty.  Patient currently rates pain in the left hip at 10 out of 10 with activity.  There is night pain, worsening of pain with activity and weight bearing, pain that interfers with activities of daily living and pain with passive range of motion.  This condition presents safety issues increasing the risk of falls.  This patient has had a left total hip with ASR shell and head.  There is no current active infection.  Patient Active Problem List   Diagnosis Date Noted  . Pain due to total hip replacement 04/16/2012   Past Medical History  Diagnosis Date  . Hypertension     takes Maxzide daily  . Heart murmur     takes Bystolic nightly  . Hyperlipidemia     takes Crestor nightly  . History of blood clots 36yrs    was on Lovenox injections and Coumadin(was only on that for short period of time)  . H/O migraine     last 74yrs ago  . Arthritis   . Joint pain   . Joint swelling   . Hx of seasonal allergies     takes Levocetirizine prn  . Diverticulosis   . H/O blood transfusion reaction 2010    hives  . Anxiety     takes Xanax prn  . Insomnia     takes Ambien prn  . History of shingles 3-43yrs ago  . WCHENIDP(824.2)      Past Surgical History  Procedure Laterality Date  . Breast reduction surgery  1987  . Joint replacement      bilateral hip rt in 2007 and lt in 2010  . Cholecystectomy    . Abdominal hysterectomy    . Colonoscopy    . Esophagogastroduodenoscopy    . Total hip revision  04/14/2012    Procedure: TOTAL HIP REVISION;  Surgeon: Kerin Salen, MD;  Location: [facility];  Service: Orthopedics;  Laterality: Right;  right total hip revision  . Eye surgery Right     eye lash removed    No prescriptions prior to admission   Allergies  Allergen Reactions  . Ibuprofen Hives and Swelling  . Latex Itching  . Penicillins Hives and Swelling    History  Substance Use Topics  . Smoking status: Passive Smoke Exposure - Never Smoker -- 42 years    Types: Cigarettes  . Smokeless tobacco: Never Used  . Alcohol Use: Yes     Comment: occasionally    No family history on file.    Review of Systems  Constitutional: Negative.   HENT: Negative.   Eyes: Negative.   Respiratory: Negative.   Cardiovascular: Negative.   Gastrointestinal: Negative.   Genitourinary: Negative.   Musculoskeletal: Positive for joint pain.  Skin: Negative.   Neurological: Negative.   Endo/Heme/Allergies:  Negative.   Psychiatric/Behavioral: Negative.     Objective:  Physical Exam  Constitutional: She is oriented to person, place, and time. She appears well-developed and well-nourished.  HENT:  Head: Normocephalic and atraumatic.  Eyes: Pupils are equal, round, and reactive to light.  Neck: Normal range of motion.  Cardiovascular: Intact distal pulses.   Respiratory: Effort normal.  Musculoskeletal:  The patient is tender over the trochanteric bursal region of the right hip.  Her range of motion bilaterally is 30-40 of internal and external rotation.  Foot tap is are negative.  She does walk with a left-sided limp secondary to groin pain.  Neurological: She is alert and oriented to person, place, and time.   Skin: Skin is warm and dry.  Psychiatric: She has a normal mood and affect. Her behavior is normal. Judgment and thought content normal.    Vital signs in last 24 hours: Temp:  [98.3 F (36.8 C)] 98.3 F (36.8 C) (02/05 1457) Pulse Rate:  [87] 87 (02/05 1457) Resp:  [18] 18 (02/05 1457) BP: (142)/(78) 142/78 mmHg (02/05 1457) SpO2:  [98 %] 98 % (02/05 1457) Weight:  [75.342 kg (166 lb 1.6 oz)] 75.342 kg (166 lb 1.6 oz) (02/05 1457)   Labs:   Estimated body mass index is 30.86 kg/(m^2) as calculated from the following:   Height as of 08/11/13: 5' (1.524 m).   Weight as of 08/11/13: 71.668 kg (158 lb).  Imaging Review:  Radiographs:  X-rays were ordered, performed, and interpreted by me today included; AP pelvis and crosstable lateral show well-placed well fixed Pinnacle cup polyethylene liner, ceramic head Tri-Lock stem on the right.  No evidence of loosening on the left.  The ASR cup ultimate head and SROM stem also appeared to be in good condition with no change in position and no evidence of loosening.  Assessment/Plan:  End stage arthritis, left hip(s) with failed previous arthroplasty.  The patient history, physical examination, clinical judgement of the provider and imaging studies are consistent with end stage degenerative joint disease of the left hip(s), previous total hip arthroplasty. Revision total hip arthroplasty is deemed medically necessary.We'll proceed with a revision of the ASR shell and head on the left side, placing a Pinnacle cup polyethylene liner and ceramic head.   The treatment options including medical management, injection therapy, arthroscopy and arthroplasty were discussed at length. The risks and benefits of total hip arthroplasty were presented and reviewed. The risks due to aseptic loosening, infection, stiffness, dislocation/subluxation,  thromboembolic complications and other imponderables were discussed.  The patient acknowledged the explanation,  agreed to proceed with the plan and consent was signed. Patient is being admitted for inpatient treatment for surgery, pain control, PT, OT, prophylactic antibiotics, VTE prophylaxis, progressive ambulation and ADL's and discharge planning. The patient is planning to be discharged to skilled nursing facility

## 2013-11-27 NOTE — Progress Notes (Signed)
Anesthesia Chart Review: Patient is a 63 year old female scheduled for a left TH revision on 11/30/13. History includes smoking exposure (passive smoker), HTN, HLD, migraines, insomnia, anxiety, arthritis, blood transfusion, murmur (not specified), right TH revision 04/14/12. PCP is Dr. Glendale Chard of [facility]). She was seen by cardiologist Dr. Sallyanne Kuster for clearance prior to her last hip surgery in 2013. (Records scanned under Media tab, Correspondence 04/14/12).   EKG from 11/26/13 showed NSR, LAE, ST/T wave abnormality in inferior and anterior leads, consider ischemia.  Overall, I think findings are similar when compared to EKG on 04/09/12 form Dr. Victorino December office.  His note from back then stated there were similar findings on EKGs dating back to 2002.  Currently, her last echo is from 02/06/10 [facility]) and shows normal LV/RV systolic function, trace MR/TR. Her last stress test 11/04/08 [facility]) showed normal myocardial perfusion, EF 81%.   There was no active disease on her CXR from 11/26/13.   Labs noted.   If no acute changes then I anticipate that she can proceed as planned.  George Hugh [facility]/Anesthesiology Phone [phone] 11/27/2013 10:10 AM

## 2013-11-29 MED ORDER — VANCOMYCIN HCL IN DEXTROSE 1-5 GM/200ML-% IV SOLN
1000.0000 mg | INTRAVENOUS | Status: AC
  Administered 2013-11-30: 1000 mg via INTRAVENOUS
  Filled 2013-11-29: qty 200

## 2013-11-30 ENCOUNTER — Inpatient Hospital Stay (HOSPITAL_COMMUNITY)
Admission: RE | Admit: 2013-11-30 | Discharge: 2013-12-03 | DRG: 468 | Disposition: A | Discharge status: Skilled Nursing Facility | Source: Ambulatory Visit | Attending: Orthopedic Surgery | Admitting: Orthopedic Surgery

## 2013-11-30 ENCOUNTER — Ambulatory Visit (HOSPITAL_COMMUNITY): Admitting: Anesthesiology

## 2013-11-30 ENCOUNTER — Encounter (HOSPITAL_COMMUNITY): Payer: Self-pay

## 2013-11-30 ENCOUNTER — Inpatient Hospital Stay (HOSPITAL_COMMUNITY)

## 2013-11-30 ENCOUNTER — Encounter (HOSPITAL_COMMUNITY): Admitting: Vascular Surgery

## 2013-11-30 ENCOUNTER — Encounter (HOSPITAL_COMMUNITY)
Admission: RE | Disposition: A | Discharge status: Skilled Nursing Facility | Payer: Self-pay | Source: Ambulatory Visit | Attending: Orthopedic Surgery

## 2013-11-30 DIAGNOSIS — T8450XA Infection and inflammatory reaction due to unspecified internal joint prosthesis, initial encounter: Principal | ICD-10-CM | POA: Diagnosis present

## 2013-11-30 DIAGNOSIS — T8484XA Pain due to internal orthopedic prosthetic devices, implants and grafts, initial encounter: Secondary | ICD-10-CM

## 2013-11-30 DIAGNOSIS — E785 Hyperlipidemia, unspecified: Secondary | ICD-10-CM | POA: Diagnosis present

## 2013-11-30 DIAGNOSIS — Z96649 Presence of unspecified artificial hip joint: Secondary | ICD-10-CM

## 2013-11-30 DIAGNOSIS — Z79899 Other long term (current) drug therapy: Secondary | ICD-10-CM

## 2013-11-30 DIAGNOSIS — I10 Essential (primary) hypertension: Secondary | ICD-10-CM | POA: Diagnosis present

## 2013-11-30 DIAGNOSIS — F411 Generalized anxiety disorder: Secondary | ICD-10-CM | POA: Diagnosis present

## 2013-11-30 DIAGNOSIS — M1610 Unilateral primary osteoarthritis, unspecified hip: Secondary | ICD-10-CM | POA: Diagnosis present

## 2013-11-30 DIAGNOSIS — G4700 Insomnia, unspecified: Secondary | ICD-10-CM | POA: Diagnosis present

## 2013-11-30 DIAGNOSIS — Y831 Surgical operation with implant of artificial internal device as the cause of abnormal reaction of the patient, or of later complication, without mention of misadventure at the time of the procedure: Secondary | ICD-10-CM | POA: Diagnosis present

## 2013-11-30 DIAGNOSIS — M169 Osteoarthritis of hip, unspecified: Secondary | ICD-10-CM | POA: Diagnosis present

## 2013-11-30 DIAGNOSIS — Z7982 Long term (current) use of aspirin: Secondary | ICD-10-CM

## 2013-11-30 HISTORY — PX: TOTAL HIP REVISION: SHX763

## 2013-11-30 SURGERY — TOTAL HIP REVISION
Anesthesia: General | Site: Hip | Laterality: Left

## 2013-11-30 MED ORDER — ONDANSETRON HCL 4 MG PO TABS
4.0000 mg | ORAL_TABLET | Freq: Four times a day (QID) | ORAL | Status: DC | PRN
  Administered 2013-12-01: 4 mg via ORAL
  Filled 2013-11-30: qty 1

## 2013-11-30 MED ORDER — LEVOCETIRIZINE DIHYDROCHLORIDE 5 MG PO TABS: 5.0000 mg | ORAL_TABLET | Freq: Every day | ORAL | Status: DC | PRN

## 2013-11-30 MED ORDER — NEBIVOLOL HCL 5 MG PO TABS
5.0000 mg | ORAL_TABLET | Freq: Every evening | ORAL | Status: DC
  Administered 2013-12-01 – 2013-12-02 (×2): 5 mg via ORAL
  Filled 2013-11-30 (×4): qty 1

## 2013-11-30 MED ORDER — METHOCARBAMOL 500 MG PO TABS
500.0000 mg | ORAL_TABLET | Freq: Four times a day (QID) | ORAL | Status: DC | PRN
  Administered 2013-11-30 – 2013-12-03 (×8): 500 mg via ORAL
  Filled 2013-11-30 (×7): qty 1

## 2013-11-30 MED ORDER — KCL IN DEXTROSE-NACL 20-5-0.45 MEQ/L-%-% IV SOLN
INTRAVENOUS | Status: DC
  Administered 2013-11-30 – 2013-12-01 (×2): via INTRAVENOUS
  Filled 2013-11-30 (×11): qty 1000

## 2013-11-30 MED ORDER — METHOCARBAMOL 500 MG PO TABS: 500.0000 mg | ORAL_TABLET | Freq: Two times a day (BID) | ORAL | Status: DC

## 2013-11-30 MED ORDER — PHENOL 1.4 % MT LIQD: 1.0000 | OROMUCOSAL | Status: DC | PRN

## 2013-11-30 MED ORDER — METHOCARBAMOL 500 MG PO TABS
ORAL_TABLET | ORAL | Status: AC
  Administered 2013-11-30: 500 mg
  Filled 2013-11-30: qty 1

## 2013-11-30 MED ORDER — BUPIVACAINE-EPINEPHRINE 0.5% -1:200000 IJ SOLN
INTRAMUSCULAR | Status: DC | PRN
  Administered 2013-11-30: 20 mL

## 2013-11-30 MED ORDER — ONDANSETRON HCL 4 MG/2ML IJ SOLN
INTRAMUSCULAR | Status: AC
  Filled 2013-11-30: qty 2

## 2013-11-30 MED ORDER — FENTANYL CITRATE 0.05 MG/ML IJ SOLN
INTRAMUSCULAR | Status: AC
  Filled 2013-11-30: qty 5

## 2013-11-30 MED ORDER — METHOCARBAMOL 100 MG/ML IJ SOLN
500.0000 mg | Freq: Four times a day (QID) | INTRAVENOUS | Status: DC | PRN
  Filled 2013-11-30: qty 5

## 2013-11-30 MED ORDER — METOCLOPRAMIDE HCL 10 MG PO TABS
5.0000 mg | ORAL_TABLET | Freq: Three times a day (TID) | ORAL | Status: DC | PRN
  Administered 2013-12-01: 10 mg via ORAL
  Filled 2013-11-30: qty 1

## 2013-11-30 MED ORDER — MAGNESIUM CITRATE PO SOLN: 1.0000 | Freq: Once | ORAL | Status: AC | PRN

## 2013-11-30 MED ORDER — METHOCARBAMOL 500 MG PO TABS
ORAL_TABLET | ORAL | Status: AC
  Filled 2013-11-30: qty 1

## 2013-11-30 MED ORDER — HYDROMORPHONE HCL PF 1 MG/ML IJ SOLN
INTRAMUSCULAR | Status: AC
  Filled 2013-11-30: qty 1

## 2013-11-30 MED ORDER — GLYCOPYRROLATE 0.2 MG/ML IJ SOLN
INTRAMUSCULAR | Status: DC | PRN
  Administered 2013-11-30: 0.4 mg via INTRAVENOUS

## 2013-11-30 MED ORDER — OXYCODONE HCL 5 MG PO TABS
5.0000 mg | ORAL_TABLET | Freq: Once | ORAL | Status: AC | PRN
  Administered 2013-11-30: 5 mg via ORAL

## 2013-11-30 MED ORDER — ACETAMINOPHEN 325 MG PO TABS: 650.0000 mg | ORAL_TABLET | Freq: Four times a day (QID) | ORAL | Status: DC | PRN

## 2013-11-30 MED ORDER — ZOLPIDEM TARTRATE 5 MG PO TABS
5.0000 mg | ORAL_TABLET | Freq: Every evening | ORAL | Status: DC | PRN
  Administered 2013-12-01 – 2013-12-02 (×2): 5 mg via ORAL
  Filled 2013-11-30 (×2): qty 1

## 2013-11-30 MED ORDER — ASPIRIN EC 325 MG PO TBEC
325.0000 mg | DELAYED_RELEASE_TABLET | Freq: Every day | ORAL | Status: DC
  Administered 2013-12-01 – 2013-12-03 (×3): 325 mg via ORAL
  Filled 2013-11-30 (×4): qty 1

## 2013-11-30 MED ORDER — CHLORHEXIDINE GLUCONATE 4 % EX LIQD: 60.0000 mL | Freq: Once | CUTANEOUS | Status: DC

## 2013-11-30 MED ORDER — SENNOSIDES-DOCUSATE SODIUM 8.6-50 MG PO TABS: 1.0000 | ORAL_TABLET | Freq: Every evening | ORAL | Status: DC | PRN

## 2013-11-30 MED ORDER — MIDAZOLAM HCL 2 MG/2ML IJ SOLN
INTRAMUSCULAR | Status: AC
  Filled 2013-11-30: qty 2

## 2013-11-30 MED ORDER — GLYCOPYRROLATE 0.2 MG/ML IJ SOLN
INTRAMUSCULAR | Status: AC
  Filled 2013-11-30: qty 2

## 2013-11-30 MED ORDER — DOCUSATE SODIUM 100 MG PO CAPS
100.0000 mg | ORAL_CAPSULE | Freq: Two times a day (BID) | ORAL | Status: DC
  Administered 2013-11-30 – 2013-12-03 (×6): 100 mg via ORAL
  Filled 2013-11-30 (×7): qty 1

## 2013-11-30 MED ORDER — ONDANSETRON HCL 4 MG/2ML IJ SOLN
4.0000 mg | Freq: Four times a day (QID) | INTRAMUSCULAR | Status: DC | PRN
  Administered 2013-11-30 – 2013-12-01 (×3): 4 mg via INTRAVENOUS
  Filled 2013-11-30 (×3): qty 2

## 2013-11-30 MED ORDER — BUPIVACAINE-EPINEPHRINE (PF) 0.25% -1:200000 IJ SOLN
INTRAMUSCULAR | Status: AC
  Filled 2013-11-30: qty 30

## 2013-11-30 MED ORDER — METOCLOPRAMIDE HCL 5 MG/ML IJ SOLN
5.0000 mg | Freq: Three times a day (TID) | INTRAMUSCULAR | Status: DC | PRN
Start: 2013-11-30 — End: 2013-12-03

## 2013-11-30 MED ORDER — ATORVASTATIN CALCIUM 20 MG PO TABS
20.0000 mg | ORAL_TABLET | Freq: Every day | ORAL | Status: DC
  Administered 2013-11-30 – 2013-12-02 (×3): 20 mg via ORAL
  Filled 2013-11-30 (×4): qty 1

## 2013-11-30 MED ORDER — MENTHOL 3 MG MT LOZG
1.0000 | LOZENGE | OROMUCOSAL | Status: DC | PRN
  Administered 2013-12-02: 3 mg via ORAL
  Filled 2013-11-30: qty 9

## 2013-11-30 MED ORDER — PROPOFOL 10 MG/ML IV BOLUS
INTRAVENOUS | Status: DC | PRN
Start: 2013-11-30 — End: 2013-11-30
  Administered 2013-11-30: 150 mg via INTRAVENOUS

## 2013-11-30 MED ORDER — MIDAZOLAM HCL 2 MG/2ML IJ SOLN
1.0000 mg | Freq: Once | INTRAMUSCULAR | Status: AC
  Administered 2013-11-30: 1 mg via INTRAVENOUS

## 2013-11-30 MED ORDER — NEOSTIGMINE METHYLSULFATE 1 MG/ML IJ SOLN
INTRAMUSCULAR | Status: DC | PRN
  Administered 2013-11-30: 3 mg via INTRAVENOUS

## 2013-11-30 MED ORDER — ASPIRIN EC 325 MG PO TBEC
325.0000 mg | DELAYED_RELEASE_TABLET | Freq: Two times a day (BID) | ORAL | Status: DC
Start: 2013-11-30 — End: 2013-12-14

## 2013-11-30 MED ORDER — ACETAMINOPHEN 650 MG RE SUPP: 650.0000 mg | Freq: Four times a day (QID) | RECTAL | Status: DC | PRN

## 2013-11-30 MED ORDER — PROPOFOL 10 MG/ML IV BOLUS
INTRAVENOUS | Status: AC
  Filled 2013-11-30: qty 20

## 2013-11-30 MED ORDER — HYDROMORPHONE HCL 2 MG PO TABS: 2.0000 mg | ORAL_TABLET | ORAL | Status: DC | PRN

## 2013-11-30 MED ORDER — ROCURONIUM BROMIDE 50 MG/5ML IV SOLN
INTRAVENOUS | Status: AC
  Filled 2013-11-30: qty 1

## 2013-11-30 MED ORDER — PROMETHAZINE HCL 25 MG/ML IJ SOLN: 6.2500 mg | INTRAMUSCULAR | Status: DC | PRN

## 2013-11-30 MED ORDER — LIDOCAINE HCL (CARDIAC) 20 MG/ML IV SOLN
INTRAVENOUS | Status: AC
  Filled 2013-11-30: qty 5

## 2013-11-30 MED ORDER — LACTATED RINGERS IV SOLN
INTRAVENOUS | Status: DC | PRN
  Administered 2013-11-30 (×2): via INTRAVENOUS

## 2013-11-30 MED ORDER — HYDROMORPHONE HCL PF 1 MG/ML IJ SOLN
1.0000 mg | INTRAMUSCULAR | Status: DC | PRN
  Administered 2013-11-30 – 2013-12-01 (×4): 1 mg via INTRAVENOUS
  Filled 2013-11-30 (×4): qty 1

## 2013-11-30 MED ORDER — FENTANYL CITRATE 0.05 MG/ML IJ SOLN
INTRAMUSCULAR | Status: DC | PRN
  Administered 2013-11-30 (×2): 50 ug via INTRAVENOUS
  Administered 2013-11-30: 100 ug via INTRAVENOUS
  Administered 2013-11-30: 50 ug via INTRAVENOUS

## 2013-11-30 MED ORDER — DEXTROSE-NACL 5-0.45 % IV SOLN: INTRAVENOUS | Status: DC

## 2013-11-30 MED ORDER — OXYCODONE HCL 5 MG/5ML PO SOLN: 5.0000 mg | Freq: Once | ORAL | Status: AC | PRN

## 2013-11-30 MED ORDER — ONDANSETRON HCL 4 MG/2ML IJ SOLN
INTRAMUSCULAR | Status: DC | PRN
  Administered 2013-11-30: 4 mg via INTRAVENOUS

## 2013-11-30 MED ORDER — OXYCODONE HCL 5 MG PO TABS
5.0000 mg | ORAL_TABLET | ORAL | Status: DC | PRN
  Administered 2013-11-30 – 2013-12-02 (×8): 10 mg via ORAL
  Administered 2013-12-02: 5 mg via ORAL
  Administered 2013-12-02 – 2013-12-03 (×2): 10 mg via ORAL
  Filled 2013-11-30: qty 1
  Filled 2013-11-30 (×10): qty 2

## 2013-11-30 MED ORDER — NEOSTIGMINE METHYLSULFATE 1 MG/ML IJ SOLN
INTRAMUSCULAR | Status: AC
  Filled 2013-11-30: qty 10

## 2013-11-30 MED ORDER — TRIAMTERENE-HCTZ 37.5-25 MG PO TABS
1.0000 | ORAL_TABLET | Freq: Every day | ORAL | Status: DC
  Administered 2013-11-30 – 2013-12-03 (×4): 1 via ORAL
  Filled 2013-11-30 (×4): qty 1

## 2013-11-30 MED ORDER — OXYCODONE HCL 5 MG PO TABS
ORAL_TABLET | ORAL | Status: AC
  Filled 2013-11-30: qty 1

## 2013-11-30 MED ORDER — DIPHENHYDRAMINE HCL 12.5 MG/5ML PO ELIX
12.5000 mg | ORAL_SOLUTION | ORAL | Status: DC | PRN
  Administered 2013-12-02: 25 mg via ORAL
  Filled 2013-11-30: qty 10

## 2013-11-30 MED ORDER — ROCURONIUM BROMIDE 100 MG/10ML IV SOLN
INTRAVENOUS | Status: DC | PRN
  Administered 2013-11-30: 50 mg via INTRAVENOUS

## 2013-11-30 MED ORDER — HYDROMORPHONE HCL PF 1 MG/ML IJ SOLN
0.5000 mg | Freq: Once | INTRAMUSCULAR | Status: AC
  Administered 2013-11-30: 0.5 mg via INTRAVENOUS

## 2013-11-30 MED ORDER — HYDROMORPHONE HCL PF 1 MG/ML IJ SOLN
0.2500 mg | INTRAMUSCULAR | Status: DC | PRN
  Administered 2013-11-30 (×4): 0.5 mg via INTRAVENOUS

## 2013-11-30 MED ORDER — LIDOCAINE HCL (CARDIAC) 20 MG/ML IV SOLN
INTRAVENOUS | Status: DC | PRN
  Administered 2013-11-30: 100 mg via INTRAVENOUS

## 2013-11-30 MED ORDER — CHLORHEXIDINE GLUCONATE 4 % EX LIQD
1.0000 | Freq: Once | CUTANEOUS | Status: DC
Start: 2013-11-30 — End: 2013-11-30

## 2013-11-30 MED ORDER — MIDAZOLAM HCL 5 MG/5ML IJ SOLN
INTRAMUSCULAR | Status: DC | PRN
  Administered 2013-11-30: 2 mg via INTRAVENOUS

## 2013-11-30 MED ORDER — BISACODYL 5 MG PO TBEC: 5.0000 mg | DELAYED_RELEASE_TABLET | Freq: Every day | ORAL | Status: DC | PRN

## 2013-11-30 SURGICAL SUPPLY — 63 items
BOWL SMART MIX CTS (DISPOSABLE) IMPLANT
BRUSH FEMORAL CANAL (MISCELLANEOUS) IMPLANT
CAPT HIP PF COP ×2 IMPLANT
CLOTH BEACON ORANGE TIMEOUT ST (SAFETY) ×2 IMPLANT
COVER SURGICAL LIGHT HANDLE (MISCELLANEOUS) ×2 IMPLANT
DRAPE C-ARM 42X72 X-RAY (DRAPES) IMPLANT
DRAPE ORTHO SPLIT 77X108 STRL (DRAPES) ×2
DRAPE PROXIMA HALF (DRAPES) ×2 IMPLANT
DRAPE SURG ORHT 6 SPLT 77X108 (DRAPES) ×1 IMPLANT
DRAPE U-SHAPE 47X51 STRL (DRAPES) ×2 IMPLANT
DRILL BIT 7/64X5 (BIT) ×2 IMPLANT
DRSG MEPILEX BORDER 4X12 (GAUZE/BANDAGES/DRESSINGS) ×2 IMPLANT
DRSG MEPILEX BORDER 4X8 (GAUZE/BANDAGES/DRESSINGS) ×2 IMPLANT
DURAPREP 26ML APPLICATOR (WOUND CARE) ×2 IMPLANT
ELECT BLADE 4.0 EZ CLEAN MEGAD (MISCELLANEOUS)
ELECT BLADE 6.5 EXT (BLADE) IMPLANT
ELECT REM PT RETURN 9FT ADLT (ELECTROSURGICAL) ×2
ELECTRODE BLDE 4.0 EZ CLN MEGD (MISCELLANEOUS) IMPLANT
ELECTRODE REM PT RTRN 9FT ADLT (ELECTROSURGICAL) ×1 IMPLANT
EVACUATOR 1/8 PVC DRAIN (DRAIN) IMPLANT
GAUZE XEROFORM 5X9 LF (GAUZE/BANDAGES/DRESSINGS) ×2 IMPLANT
GLOVE BIO SURGEON STRL SZ7.5 (GLOVE) ×2 IMPLANT
GLOVE BIO SURGEON STRL SZ8.5 (GLOVE) ×4 IMPLANT
GLOVE BIOGEL PI IND STRL 8 (GLOVE) ×2 IMPLANT
GLOVE BIOGEL PI IND STRL 9 (GLOVE) ×1 IMPLANT
GLOVE BIOGEL PI INDICATOR 8 (GLOVE) ×2
GLOVE BIOGEL PI INDICATOR 9 (GLOVE) ×1
GOWN PREVENTION PLUS XLARGE (GOWN DISPOSABLE) ×6 IMPLANT
GOWN STRL NON-REIN LRG LVL3 (GOWN DISPOSABLE) ×4 IMPLANT
GOWN STRL REIN XL XLG (GOWN DISPOSABLE) ×4 IMPLANT
HANDPIECE INTERPULSE COAX TIP (DISPOSABLE)
HEEL PROTECTOR  874200 (MISCELLANEOUS)
HEEL PROTECTOR 874200 (MISCELLANEOUS) IMPLANT
HOOD PEEL AWAY FACE SHEILD DIS (HOOD) ×4 IMPLANT
KIT BASIN OR (CUSTOM PROCEDURE TRAY) ×2 IMPLANT
KIT ROOM TURNOVER OR (KITS) ×2 IMPLANT
MANIFOLD NEPTUNE II (INSTRUMENTS) ×2 IMPLANT
NEEDLE 22X1 1/2 (OR ONLY) (NEEDLE) ×2 IMPLANT
NOZZLE PRISM 8.5MM (MISCELLANEOUS) IMPLANT
NS IRRIG 1000ML POUR BTL (IV SOLUTION) ×4 IMPLANT
PACK TOTAL JOINT (CUSTOM PROCEDURE TRAY) ×2 IMPLANT
PAD ARMBOARD 7.5X6 YLW CONV (MISCELLANEOUS) ×4 IMPLANT
PASSER SUT SWANSON 36MM LOOP (INSTRUMENTS) IMPLANT
PRESSURIZER FEMORAL UNIV (MISCELLANEOUS) IMPLANT
SET HNDPC FAN SPRY TIP SCT (DISPOSABLE) IMPLANT
SLEEVE SURGEON STRL (DRAPES) IMPLANT
SPONGE GAUZE 4X4 12PLY (GAUZE/BANDAGES/DRESSINGS) ×2 IMPLANT
SPONGE LAP 18X18 X RAY DECT (DISPOSABLE) IMPLANT
STAPLER VISISTAT 35W (STAPLE) ×2 IMPLANT
SUT ETHIBOND 2 V 37 (SUTURE) ×2 IMPLANT
SUT ETHILON 3 0 FSL (SUTURE) ×2 IMPLANT
SUT VIC AB 0 CTB1 27 (SUTURE) ×2 IMPLANT
SUT VIC AB 1 CTX 36 (SUTURE) ×2
SUT VIC AB 1 CTX36XBRD ANBCTR (SUTURE) ×1 IMPLANT
SUT VIC AB 2-0 CTB1 (SUTURE) ×2 IMPLANT
SYR 20ML ECCENTRIC (SYRINGE) ×2 IMPLANT
SYR CONTROL 10ML LL (SYRINGE) ×2 IMPLANT
TOWEL OR 17X24 6PK STRL BLUE (TOWEL DISPOSABLE) ×2 IMPLANT
TOWEL OR 17X26 10 PK STRL BLUE (TOWEL DISPOSABLE) ×2 IMPLANT
TOWER CARTRIDGE SMART MIX (DISPOSABLE) IMPLANT
TRAY FOLEY CATH 14FR (SET/KITS/TRAYS/PACK) IMPLANT
TUBE ANAEROBIC SPECIMEN COL (MISCELLANEOUS) ×2 IMPLANT
WATER STERILE IRR 1000ML POUR (IV SOLUTION) ×6 IMPLANT

## 2013-11-30 NOTE — Progress Notes (Signed)
Report given to robin rn as caregiver 

## 2013-11-30 NOTE — Progress Notes (Addendum)
Clinical Social Work Department CLINICAL SOCIAL WORK PLACEMENT NOTE 11/30/2013  Patient:  [name], [name]  Account Number:  [id number] Admit date:  11/30/2013  Clinical Social Worker:  Adair Laundry  Date/time:  11/30/2013 03:30 PM  Clinical Social Work is seeking post-discharge placement for this patient at the following level of care:   SKILLED NURSING   (*CSW will update this form in Epic as items are completed)   11/30/2013  Patient/family provided with [facility] Department of Clinical Social Work's list of facilities offering this level of care within the geographic area requested by the patient (or if unable, by the patient's family).  11/30/2013  Patient/family informed of their freedom to choose among providers that offer the needed level of care, that participate in Medicare, Medicaid or managed care program needed by the patient, have an available bed and are willing to accept the patient.  11/30/2013  Patient/family informed of [facility]' ownership interest in [facility], as well as of the fact that they are under no obligation to receive care at this facility.  PASARR submitted to EDS on Existing PASARR number received from EDS on   FL2 transmitted to all facilities in geographic area requested by pt/family on  11/30/2013 FL2 transmitted to all facilities within larger geographic area on   Patient informed that his/her managed care company has contracts with or will negotiate with  certain facilities, including the following:     Patient/family informed of bed offers received:  12/01/2013  Patient chooses bed at [facility] Physician recommends and patient chooses bed at    Patient to be transferred to 12/02/2013  on  [place] Patient to be transferred to facility by [facility]  The following physician request were entered in Epic:   Additional Comments:  [place], [place]

## 2013-11-30 NOTE — Evaluation (Signed)
Physical Therapy Evaluation Patient Details Name: [name] MRN: [id number] DOB: 06/29/1951 Today's Date: 11/30/2013 Time: 1340-1400 PT Time Calculation (min): 20 min  PT Assessment / Plan / Recommendation History of Present Illness  Pt s/p L THA, posterior precautions  Clinical Impression  Limited eval due to lethargy, pt unwilling to get OOB.  Pt requires assist with bed mobility and will benefit from skilled PT to address deficits in strength and mobility and increase functional independence for d/c to SNF for further rehab.    PT Assessment  Patient needs continued PT services    Follow Up Recommendations  SNF    Does the patient have the potential to tolerate intense rehabilitation      Barriers to Discharge        Equipment Recommendations  None recommended by PT    Recommendations for Other Services     Frequency 7X/week    Precautions / Restrictions Precautions Precautions: Posterior Hip Precaution Booklet Issued: Yes (comment) Precaution Comments: reviewed posterior hip precautions with pt/family Restrictions Weight Bearing Restrictions: Yes LLE Weight Bearing: Weight bearing as tolerated   Pertinent Vitals/Pain No c/o pain, pt lethargic      Mobility  Bed Mobility Overal bed mobility: Needs Assistance Bed Mobility: Supine to Sit Supine to sit: Min assist General bed mobility comments: assist for L LE, uses handrails to assist with trunk.  min A to scoot hips forward to edge of bed Transfers General transfer comment: pt refused to attempt OOB transfer today, states she will attempt OOB activity tomorrow    Exercises     PT Diagnosis: Difficulty walking;Generalized weakness;Acute pain  PT Problem List: Decreased strength;Decreased mobility;Decreased activity tolerance;Decreased balance;Decreased knowledge of use of DME;Decreased knowledge of precautions;Pain PT Treatment Interventions: DME instruction;Therapeutic exercise;Gait training;Balance  training;Wheelchair mobility training;Stair training;Neuromuscular re-education;Modalities;Functional mobility training;Therapeutic activities;Patient/family education     PT Goals(Current goals can be found in the care plan section) Acute Rehab PT Goals Patient Stated Goal: do better tomorrow PT Goal Formulation: With patient Time For Goal Achievement: 12/07/13 Potential to Achieve Goals: Good  Visit Information  Last PT Received On: 11/30/13 Assistance Needed: +1 History of Present Illness: Pt s/p L THA, posterior precautions       Prior Functioning  Home Living Family/patient expects to be discharged to:: Skilled nursing facility Living Arrangements: Spouse/significant other Prior Function Level of Independence: Independent Communication Communication: No difficulties    Cognition  Cognition Arousal/Alertness: Lethargic Behavior During Therapy: WFL for tasks assessed/performed Overall Cognitive Status: Within Functional Limits for tasks assessed    Extremity/Trunk Assessment Upper Extremity Assessment Upper Extremity Assessment: Overall WFL for tasks assessed Lower Extremity Assessment Lower Extremity Assessment: Generalized weakness Cervical / Trunk Assessment Cervical / Trunk Assessment: Normal   Balance Balance Sitting balance - Comments: pt sat edge of bed x 5 minutes to eat lunch with supervision. requires 1 UE support at all times, limited by lethargy and weakness  End of Session PT - End of Session Equipment Utilized During Treatment: Oxygen Activity Tolerance: Patient limited by lethargy Patient left: in bed;with family/visitor present;with call bell/phone within reach Nurse Communication: Mobility status  GP     Michaeal Davis 11/30/2013, 2:24 PM

## 2013-11-30 NOTE — Progress Notes (Signed)
Clinical Social Work Department BRIEF PSYCHOSOCIAL ASSESSMENT 11/30/2013  Patient:  [name], [name]     Account Number:  [id number]     Admit date:  11/30/2013  Clinical Social Worker:  Adair Laundry  Date/Time:  11/30/2013 03:00 PM  Referred by:  Physician  Date Referred:  11/30/2013 Referred for  SNF Placement   Other Referral:   Interview type:  Patient Other interview type:    PSYCHOSOCIAL DATA Living Status:  HUSBAND Admitted from facility:   Level of care:   Primary support name:  [name] [phone] Primary support relationship to patient:  SPOUSE Degree of support available:   Pt has supportive family    CURRENT CONCERNS Current Concerns  Post-Acute Placement   Other Concerns:    SOCIAL WORK ASSESSMENT / PLAN CSW aware of PT recommendation for SNF. CSW visited pt room. Pt was laying in bed and stated she was fine "but in some pain". CSW asked if pt needed anything. Pt stated no the nurse had just come in and given her some medication. CSW informed pt or recommendation. Pt was aware of recommendation and stated she has been to [facility] previously and has already talked to them about returning prior to admission. Pt informed CSW she would be "fine" with discharging to [facility] if that is what she needed, but she is currently undecided because if possible she wants to go home. Pt daughter is coming into town for about 10 days. Pt is hoping to be able to dc home with family and HH if appropriate. CSW informed pt that CSW would go ahead and contact facility but would also let RN CM know that pt may want to dc home. Plan is to keep both options open and see how pt progresses with therapy in the hospital.   Assessment/plan status:  Psychosocial Support/Ongoing Assessment of Needs Other assessment/ plan:   Information/referral to community resources:   None needed    PATIENT'S/FAMILY'S RESPONSE TO PLAN OF CARE: Pt is currently undecided on dc plan. Pt agreeable to  SNF if needed but would prefer home with family.        [place], [place]

## 2013-11-30 NOTE — Transfer of Care (Signed)
Immediate Anesthesia Transfer of Care Note  Patient: [name]  Procedure(s) Performed: Procedure(s): TOTAL HIP REVISION (Left)  Patient Location: PACU  Anesthesia Type:General  Level of Consciousness: awake  Airway & Oxygen Therapy: Patient Spontanous Breathing and Patient connected to nasal cannula oxygen  Post-op Assessment: Report given to PACU RN and Post -op Vital signs reviewed and stable  Post vital signs: Reviewed and stable  Complications: No apparent anesthesia complications

## 2013-11-30 NOTE — Op Note (Addendum)
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 And new [phone] SROm stem Surgeon: Kathalene Frames. Mayer Camel M.D.  Assistant: Kerry Hough. Barton Dubois  (present throughout entire procedure and necessary for timely completion of the procedure)  Estimated blood loss: 400 cc  Fluid replacement: 1500 cc of crystalloid  Complications: None  Specimens: The ASR shell and head was sent to pathology using standard chain of custody procedure. We also sent Gram stain and cultures of the synovial tissue although it did not grossly appear to be infected  Indications: Patient with a left ASR on S-ROM total hip that did very well until a few months ago when he had increasing groin pain. The pain wakes her up at night and recently got to the point where he could no longer go walking on a regular basis. MRI scan showed no fluid collection, but no bony destruction. Plain x-rays show no change in the position of the components and the stem appears to be well ingrown. Risks and benefits of revision surgery have been discussed and questions answered.  Procedure: Patient was identified by arm band receive preoperative IV antibiotics in the holding area at, and hospital. He was then taken to the operating room where the appropriate anesthetic monitors were attached and general endotracheal anesthesia induced with the patient in the supine position. She was then rolled into the R lateral decubitus position and fixed there with a mark 2 pelvic clamp. A Foley catheter was inserted and the limb prepped and draped in usual sterile fashion from the ankle to the hemipelvis. Time out procedure performed. Skin along the lateral hip and thigh infiltrated with 10 cc of 1/2% Marcaine and epinephrine  solution. We began the operation by recreating the old posterior lateral incision 18 cm in line through the skin and subcutaneous tissue down to the level of the IT band which was cut in line with the skin incision. This exposed the sac of fluid which was sent off for Gram stain and culture. It came back with no cells. The sac was excised. We then remove scar tissue from around to the ASR cup and femoral stem trunnion, dislocated a total hip and removed the ASR head with a mallet and metal cylinder. The interface between the sleeve and the trunnion was then pried loose using the large flat screwdriver, soft tissue removed from around the greater trochanter medially and a 18 x 13 x 165 stem was removed. We continued to remove scar tissue from around the acetabular component. Gram stain and culture specimens were sent from this tissue. A posterior inferior wing retractor was hammered into place. And we began loosening the cup by placing a 1/4 inch osteotome between the edge of the acetabular component and the bone. We then used the short Innomed curved osteotome around the edge of the cup and at that point it came out relatively easily indicating no and growth. No bone was noticed on the ingrowth surface of the cup and fibrous tissue is then stripped from the acetabulum. We reamed up to a 55 mm basket reamer obtaining good coverage in all quadrants irrigated with normal saline solution. We then hammered into place a 54 mm Pinnacle cup in 45 of abduction and 20 of anteversion. A central occluder  was screwed into place followed by a 10 polyethylene liner index posterior and superior. A trial reduction was then performed with a +0 36 mm femoral head instability was noted to 90 of flexion 70 of internal rotation and in full extension the hip could not be dislocated with external rotation. We then placed a 15.5 mm axial reamers through the sleeve, irrigated the femoral canal, and hammered into place a new 18 x 13 x 165  x 42 S-ROM stem in 20 of anteversion and it seated nicely. At this point a real +3 36 mm ceramic head was hammered into place, the hip reduced and irrigated with normal saline solution. The capsular flap was repaired back to the intertrochanteric crest through drill holes with #2 Ethibond suture. We then closed the IT band with running #1 Vicryl suture, the subcutaneous tissue with 0 and 2-0 undyed Vicryl suture, the skin was closed with running interlocking 3-0 nylon suture. A dressing of Mepilex was then applied, the patient was unclamped a rolled supine awakened extubated and taken to the recovery without difficulty.

## 2013-11-30 NOTE — Progress Notes (Signed)
Utilization review completed.  

## 2013-11-30 NOTE — Anesthesia Preprocedure Evaluation (Signed)
Anesthesia Evaluation  Patient identified by MRN, date of birth, ID band Patient awake    Reviewed: Allergy & Precautions, H&P , NPO status , Patient's Chart, lab work & pertinent test results  Airway Mallampati: I      Dental   Pulmonary  breath sounds clear to auscultation        Cardiovascular hypertension, Rhythm:Regular Rate:Normal     Neuro/Psych  Headaches,    GI/Hepatic   Endo/Other    Renal/GU      Musculoskeletal  (+) Arthritis -,   Abdominal   Peds  Hematology   Anesthesia Other Findings   Reproductive/Obstetrics                           Anesthesia Physical Anesthesia Plan  ASA: II  Anesthesia Plan: General   Post-op Pain Management:    Induction: Intravenous  Airway Management Planned:   Additional Equipment:   Intra-op Plan:   Post-operative Plan: Extubation in OR  Informed Consent: I have reviewed the patients History and Physical, chart, labs and discussed the procedure including the risks, benefits and alternatives for the proposed anesthesia with the patient or authorized representative who has indicated his/her understanding and acceptance.   Dental advisory given  Plan Discussed with: CRNA and Surgeon  Anesthesia Plan Comments:         Anesthesia Quick Evaluation

## 2013-11-30 NOTE — Interval H&P Note (Signed)
History and Physical Interval Note:  11/30/2013 7:17 AM  [name]  has presented today for surgery, with the diagnosis of PAINFUL LEFT ASR RECALL HIP  The various methods of treatment have been discussed with the patient and family. After consideration of risks, benefits and other options for treatment, the patient has consented to  Procedure(s): TOTAL HIP REVISION (Left) as a surgical intervention .  The patient's history has been reviewed, patient examined, no change in status, stable for surgery.  I have reviewed the patient's chart and labs.  Questions were answered to the patient's satisfaction.     Kerin Salen

## 2013-11-30 NOTE — Plan of Care (Signed)
Problem: Consults Goal: Diagnosis- Total Joint Replacement Revision Total Hip Left        

## 2013-11-30 NOTE — Anesthesia Postprocedure Evaluation (Signed)
  Anesthesia Post-op Note  Patient: [name]  Procedure(s) Performed: Procedure(s): TOTAL HIP REVISION (Left)  Patient Location: PACU  Anesthesia Type:General  Level of Consciousness: awake and alert   Airway and Oxygen Therapy: Patient Spontanous Breathing  Post-op Pain: moderate  Post-op Assessment: Post-op Vital signs reviewed and Pain level not controlled  Post-op Vital Signs: stable  Complications: No apparent anesthesia complications

## 2013-11-30 NOTE — Preoperative (Signed)
Beta Blockers   Reason not to administer Beta Blockers:Not Applicable 

## 2013-12-01 LAB — BASIC METABOLIC PANEL
BUN: 9 mg/dL (ref 6–23)
CHLORIDE: 100 meq/L (ref 96–112)
CO2: 24 meq/L (ref 19–32)
Calcium: 8.5 mg/dL (ref 8.4–10.5)
Creatinine, Ser: 0.7 mg/dL (ref 0.50–1.10)
GFR calc Af Amer: >90 mL/min (ref >90)
GFR calc non Af Amer: >90 mL/min (ref >90)
GLUCOSE: 147 mg/dL — AB (ref 70–99)
POTASSIUM: 4.3 meq/L (ref 3.7–5.3)
Sodium: 140 mEq/L (ref 137–147)

## 2013-12-01 LAB — CBC
HCT: 32.1 % — ABNORMAL LOW (ref 36.0–46.0)
HEMOGLOBIN: 10.5 g/dL — AB (ref 12.0–15.0)
MCH: 29 pg (ref 26.0–34.0)
MCHC: 32.7 g/dL (ref 30.0–36.0)
MCV: 88.7 fL (ref 78.0–100.0)
Platelets: 163 10*3/uL (ref 150–400)
RBC: 3.62 MIL/uL — AB (ref 3.87–5.11)
RDW: 15.4 % (ref 11.5–15.5)
WBC: 6 10*3/uL (ref 4.0–10.5)

## 2013-12-01 NOTE — Evaluation (Signed)
Occupational Therapy Evaluation Patient Details Name: [name] MRN: [id number] DOB: 02-14-1951 Today's Date: 12/01/2013 Time: 1660-6301 OT Time Calculation (min): 27 min  OT Assessment / Plan / Recommendation History of present illness Pt s/p L THA, posterior precautions   Clinical Impression   Pt admitted with the above diagnosis and has the deficits listed below.  Pt would benefit from OT to increase adls I to mod I level so she can care for herself at home.    OT Assessment  Patient needs continued OT Services    Follow Up Recommendations  SNF    Barriers to Discharge Decreased caregiver support hsuband gets dialysis and cannot always assist.  Equipment Recommendations  None recommended by OT    Recommendations for Other Services    Frequency  Min 2X/week    Precautions / Restrictions Precautions Precautions: Posterior Hip Precaution Booklet Issued: Yes (comment) Precaution Comments: reviewed posterior hip precautions Restrictions Weight Bearing Restrictions: Yes LLE Weight Bearing: Weight bearing as tolerated   Pertinent Vitals/Pain Pt  w 5/10 pain    ADL  Eating/Feeding: Performed;Independent Where Assessed - Eating/Feeding: Chair Grooming: Performed;Wash/dry hands;Teeth care;Supervision/safety Where Assessed - Grooming: Unsupported standing Upper Body Bathing: Performed;Set up Where Assessed - Upper Body Bathing: Unsupported sitting Lower Body Bathing: Simulated;Moderate assistance Where Assessed - Lower Body Bathing: Unsupported sit to stand Upper Body Dressing: Performed;Set up Where Assessed - Upper Body Dressing: Unsupported sitting Lower Body Dressing: Performed;Moderate assistance Where Assessed - Lower Body Dressing: Unsupported sit to stand Toilet Transfer: Performed;Supervision/safety Toilet Transfer Method: Sit to stand;Stand pivot Toilet Transfer Equipment: Grab bars;Raised toilet seat with arms (or 3-in-1 over toilet) Toileting - Clothing  Manipulation and Hygiene: Performed;Supervision/safety Where Assessed - Camera operator Manipulation and Hygiene: Standing Equipment Used: Sock aid;Rolling walker;Reacher Transfers/Ambulation Related to ADLs: Walked to bathroom with S.  Cues for hand placement when standing. ADL Comments: Pt re introduced to reacher and sock aid.  Pt states past hip surgeries have been in the summer so she does not always use the sock aid.  Pt demonstrated apropriate use of both.    OT Diagnosis: Acute pain;Generalized weakness  OT Problem List: Decreased strength;Pain;Impaired balance (sitting and/or standing) OT Treatment Interventions: Self-care/ADL training;Therapeutic activities   OT Goals(Current goals can be found in the care plan section) Acute Rehab OT Goals Patient Stated Goal: to get home and not have another surgery OT Goal Formulation: With patient Time For Goal Achievement: 12/15/13 Potential to Achieve Goals: Good ADL Goals Pt Will Perform Lower Body Dressing: with modified independence;with adaptive equipment;sit to/from stand Pt Will Perform Tub/Shower Transfer: Tub transfer;shower seat;rolling walker;ambulating;with supervision Additional ADL Goal #1: pt will toilet with 3:1 over commode with S  Visit Information  Last OT Received On: 12/01/13 Assistance Needed: +1 History of Present Illness: Pt s/p L THA, posterior precautions       Prior Functioning     Home Living Family/patient expects to be discharged to:: Skilled nursing facility Living Arrangements: Spouse/significant other Additional Comments: spouse has dialysis and cannot assist too much. Prior Function Level of Independence: Independent Communication Communication: No difficulties Dominant Hand: Right         Vision/Perception     Cognition  Cognition Arousal/Alertness: Awake/alert Behavior During Therapy: WFL for tasks assessed/performed Overall Cognitive Status: Within Functional Limits for tasks  assessed    Extremity/Trunk Assessment Upper Extremity Assessment Upper Extremity Assessment: Overall WFL for tasks assessed Lower Extremity Assessment Lower Extremity Assessment: Defer to PT evaluation Cervical / Trunk Assessment Cervical /  Trunk Assessment: Normal     Mobility Bed Mobility Overal bed mobility: Needs Assistance Bed Mobility: Sit to Supine;Supine to Sit Supine to sit: Supervision (w rails) Sit to supine: Min assist General bed mobility comments: assist for L LE Transfers Overall transfer level: Needs assistance Equipment used: Rolling walker (2 wheeled) Transfers: Sit to/from [facility] Sit to Stand: Supervision Stand pivot transfers: Supervision General transfer comment: supervision to stand from bed and recliner and toilet     Exercise     Balance Balance Overall balance assessment: Needs assistance Sitting-balance support: Feet supported Sitting balance-Leahy Scale: Normal Standing balance support: Bilateral upper extremity supported;During functional activity Standing balance-Leahy Scale: Poor Standing balance comment: needs walker but S with walker   End of Session OT - End of Session Equipment Utilized During Treatment: Rolling walker Activity Tolerance: Patient tolerated treatment well Patient left: in bed;with call bell/phone within reach Nurse Communication: Mobility status  GO     Glenford Peers 12/01/2013, 1:20 PM [phone]

## 2013-12-01 NOTE — Care Management Note (Signed)
CARE MANAGEMENT NOTE 12/01/2013  Patient:  [name], [name]   Account Number:  [id number]  Date Initiated:  12/01/2013  Documentation initiated by:  Ricki Miller  Subjective/Objective Assessment:   63 yr old female s/p left total hip revision d/t recall     Action/Plan:   Case manager spoke with patient concerning discharge plan. patient will need shortterm rehab at [facility].She wants [facility]. Social Worker is aware.   Anticipated DC Date:  12/02/2013   Anticipated DC Plan:  SKILLED NURSING FACILITY  In-house referral  Clinical Social Worker      DC Planning Services  CM consult      Choice offered to / List presented to:             Status of service:  Completed, signed off Medicare Important Message given?   (If response is "NO", the following Medicare IM given date fields will be blank) Date Medicare IM given:   Date Additional Medicare IM given:    Discharge Disposition:  SKILLED NURSING FACILITY  Per UR Regulation:    If discussed at Long Length of Stay Meetings, dates discussed:    Comments:

## 2013-12-01 NOTE — Progress Notes (Signed)
Patient ID: [name], female   DOB: [birth year], 63 y.o.   MRN: [id number] PATIENT ID: [name]  MRN: [id number]  DOB/AGE:  1951/06/01 / 63 y.o.  1 Day Post-Op Procedure(s) (LRB): TOTAL HIP REVISION (Left)    PROGRESS NOTE Subjective: Patient is alert, oriented,1x yesterday Nausea, no Vomiting, yes passing gas, no Bowel Movement. Taking PO well. Denies SOB, Chest or Calf Pain. Using Incentive Spirometer, PAS in place. Ambulate bed to chair, WBAT today Patient reports pain as 5 on 0-10 scale  .    Objective: Vital signs in last 24 hours: Filed Vitals:   12/01/13 0100 12/01/13 0205 12/01/13 0400 12/01/13 0542  BP:  121/63  106/65  Pulse:  82  84  Temp:  98.7 F (37.1 C)  97.9 F (36.6 C)  TempSrc:  Oral  Oral  Resp:  18 17 18   Height: 4' 11.5" (1.511 m)     Weight: 75.342 kg (166 lb 1.6 oz)     SpO2:  98% 99% 98%      Intake/Output from previous day: I/O last 3 completed shifts: In: 2815.8 [P.O.:390; I.V.:2425.8] Out: 300 [Urine:250; Blood:50]   Intake/Output this shift:     LABORATORY DATA:  Recent Labs  12/01/13 0500  WBC 6.0  HGB 10.5*  HCT 32.1*  PLT 163  NA 140  K 4.3  CL 100  CO2 24  BUN 9  CREATININE 0.70  GLUCOSE 147*  CALCIUM 8.5    Examination: Neurologically intact ABD soft Neurovascular intact Sensation intact distally Intact pulses distally Dorsiflexion/Plantar flexion intact Incision: scant drainage No cellulitis present Compartment soft} XR AP&Lat of hip shows well placed\fixed THA  Assessment:   1 Day Post-Op Procedure(s) (LRB): TOTAL HIP REVISION (Left) ADDITIONAL DIAGNOSIS:  Hypertension  Plan: PT/OT WBAT, THA  posterior precautions  DVT Prophylaxis: SCDx72 hrs, ASA 325 mg BID x 2 weeks  DISCHARGE PLAN: Skilled Nursing Facility/Rehab, desires [facility]  DISCHARGE NEEDS: HHPT, HHRN, CPM, Walker and 3-in-1 comode seat

## 2013-12-01 NOTE — Progress Notes (Signed)
Physical Therapy Treatment Patient Details Name: [name] MRN: [id number] DOB: 1951-06-21 Today's Date: 12/01/2013 Time: 7564-3329 PT Time Calculation (min): 33 min  PT Assessment / Plan / Recommendation  History of Present Illness Pt s/p L THA, posterior precautions   PT Comments   Pt limited by nausea this session, requires frequent rest breaks.  RN made aware.  Improved to supervision with transfers and gait, continues to require min A for bed mobility due to L LE weakness and pain.  Pt will need 24 hour assistance at d/c either from family or SNF.  Follow Up Recommendations  SNF;Home health PT;Supervision/Assistance - 24 hour (snf vs HHPT and 24 hour assistance)     Does the patient have the potential to tolerate intense rehabilitation     Barriers to Discharge        Equipment Recommendations  None recommended by PT    Recommendations for Other Services    Frequency 7X/week   Progress towards PT Goals Progress towards PT goals: Progressing toward goals  Plan Current plan remains appropriate    Precautions / Restrictions Precautions Precautions: Posterior Hip Precaution Comments: reviewed posterior hip precautions Restrictions Weight Bearing Restrictions: Yes LLE Weight Bearing: Weight bearing as tolerated   Pertinent Vitals/Pain Pt c/o 5/10 pain in L LE with gait, RN aware    Mobility  Bed Mobility Bed Mobility: Sit to Supine Sit to supine: Min assist General bed mobility comments: assist for L LE Transfers Equipment used: Rolling walker (2 wheeled) General transfer comment: supervision to stand from bed and recliner and toilet Ambulation/Gait Ambulation/Gait assistance: Supervision Ambulation Distance (Feet): 100 Feet Assistive device: Rolling walker (2 wheeled) Gait velocity interpretation: Below normal speed for age/gender General Gait Details: step through pattern, no LOB.  gait also performed around room to gather items for grooming with suprevision  assist, good problem solving with RW negotiation throughout room    Exercises     PT Diagnosis:    PT Problem List:   PT Treatment Interventions:     PT Goals (current goals can now be found in the care plan section)    Visit Information  Last PT Received On: 12/01/13 Assistance Needed: +1 History of Present Illness: Pt s/p L THA, posterior precautions    Subjective Data      Cognition  Cognition Arousal/Alertness: Awake/alert Behavior During Therapy: WFL for tasks assessed/performed Overall Cognitive Status: Within Functional Limits for tasks assessed    Balance     End of Session PT - End of Session Activity Tolerance: Patient tolerated treatment well Patient left: in bed;with call bell/phone within reach Nurse Communication: Mobility status   GP     Hari Casaus 12/01/2013, 10:09 AM

## 2013-12-02 LAB — CBC
HCT: 29.5 % — ABNORMAL LOW (ref 36.0–46.0)
HEMOGLOBIN: 9.7 g/dL — AB (ref 12.0–15.0)
MCH: 29.2 pg (ref 26.0–34.0)
MCHC: 32.9 g/dL (ref 30.0–36.0)
MCV: 88.9 fL (ref 78.0–100.0)
Platelets: 155 10*3/uL (ref 150–400)
RBC: 3.32 MIL/uL — AB (ref 3.87–5.11)
RDW: 15.6 % — ABNORMAL HIGH (ref 11.5–15.5)
WBC: 7.4 10*3/uL (ref 4.0–10.5)

## 2013-12-02 NOTE — Discharge Summary (Addendum)
Patient ID: [name] MRN: [id number] DOB/AGE: [birth year] 63 y.o.  Admit date: 11/30/2013 Discharge date: 12/03/2013  Admission Diagnoses:  Active Problems:   S/P revision of total hip   Discharge Diagnoses:  Same  Past Medical History  Diagnosis Date  . Hypertension     takes Maxzide daily  . Heart murmur     takes Bystolic nightly  . Hyperlipidemia     takes Crestor nightly  . History of blood clots 52yrs    was on Lovenox injections and Coumadin(was only on that for short period of time)  . H/O migraine     last 22yrs ago  . Arthritis   . Joint pain   . Joint swelling   . Hx of seasonal allergies     takes Levocetirizine prn  . Diverticulosis   . H/O blood transfusion reaction 2010    hives  . Anxiety     takes Xanax prn  . Insomnia     takes Ambien prn  . History of shingles 3-65yrs ago  . [id number])     Surgeries: Procedure(s): TOTAL HIP REVISION on 11/30/2013   Consultants:    Discharged Condition: Improved  Hospital Course: [name] is an 64 y.o. female who was admitted 11/30/2013 for operative treatment of<principal problem not specified>. Patient has severe unremitting pain that affects sleep, daily activities, and work/hobbies. After pre-op clearance the patient was taken to the operating room on 11/30/2013 and underwent  Procedure(s): TOTAL HIP REVISION.    Patient was given perioperative antibiotics: Anti-infectives   Start     Dose/Rate Route Frequency Ordered Stop   11/30/13 0600  vancomycin (VANCOCIN) IVPB 1000 mg/200 mL premix     1,000 mg 200 mL/hr over 60 Minutes Intravenous On call to O.R. 11/29/13 1439 11/30/13 [id number]       Patient was given sequential compression devices, early ambulation, and chemoprophylaxis to prevent DVT.  Patient benefited maximally from hospital stay and there were no complications.    Recent vital signs: Patient Vitals for the past 24 hrs:  BP Temp Temp src Pulse Resp SpO2  12/03/13 0800 - - - - 18 -   12/03/13 0551 114/64 mmHg 98.9 F (37.2 C) Oral 90 18 96 %  12/02/13 2134 109/60 mmHg 98.8 F (37.1 C) Oral 101 18 95 %  12/02/13 1356 111/59 mmHg 98.7 F (37.1 C) Oral 103 18 95 %     Recent laboratory studies:  Recent Labs  12/01/13 0500 12/02/13 0420 12/03/13 0414  WBC 6.0 7.4 7.0  HGB 10.5* 9.7* 9.1*  HCT 32.1* 29.5* 28.2*  PLT 163 155 162  NA 140  --   --   K 4.3  --   --   CL 100  --   --   CO2 24  --   --   BUN 9  --   --   CREATININE 0.70  --   --   GLUCOSE 147*  --   --   CALCIUM 8.5  --   --      Discharge Medications:     Medication List         aspirin EC 325 MG tablet  Take 1 tablet (325 mg total) by mouth 2 (two) times daily.     HYDROmorphone 2 MG tablet  Commonly known as:  DILAUDID  Take 1 tablet (2 mg total) by mouth every 4 (four) hours as needed for severe pain.     levocetirizine 5 MG tablet  Commonly known as:  XYZAL  Take 5 mg by mouth daily as needed for allergies.     methocarbamol 500 MG tablet  Commonly known as:  ROBAXIN  Take 1 tablet (500 mg total) by mouth 2 (two) times daily with a meal.     nebivolol 5 MG tablet  Commonly known as:  BYSTOLIC  Take 5 mg by mouth every evening.     rosuvastatin 10 MG tablet  Commonly known as:  CRESTOR  Take 10 mg by mouth every evening.     triamterene-hydrochlorothiazide 37.5-25 MG per tablet  Commonly known as:  MAXZIDE-25  Take 1 tablet by mouth daily.     zolpidem 5 MG tablet  Commonly known as:  AMBIEN  Take 5 mg by mouth at bedtime as needed for sleep.        Diagnostic Studies: Dg Chest 2 View  11/26/2013   CLINICAL DATA:  Hypertension.  EXAM: CHEST  2 VIEW  COMPARISON:  CT ANGIO CHEST W/CM &/OR WO/CM dated 06/11/2012; DG CHEST 2 VIEW dated 04/04/2012  FINDINGS: The heart size and mediastinal contours are within normal limits. Both lungs are clear. No pleural effusion or pneumothorax. Degenerative changes thoracic spine. Scoliosis thoracolumbar spine concave left again noted.  Surgical clips right upper quadrant.  IMPRESSION: No active cardiopulmonary disease.   Electronically Signed   By: Marcello Moores  Register   On: 11/26/2013 16:17   Dg Pelvis Portable  11/30/2013   CLINICAL DATA:  Status post revision of total hip arthroplasty.  EXAM: PORTABLE PELVIS 1-2 VIEWS  COMPARISON:  04/14/2012.  FINDINGS: Bilateral total hip arthroplasties are noted without complicating features. Remote right-sided pubic rami fractures are noted.  IMPRESSION: Bilateral hip prosthesis without complicating features.  Remote healed right-sided pubic rami fractures.   Electronically Signed   By: Kalman Jewels M.D.   On: 11/30/2013 11:32    Disposition: 01-Home or Self Care      Discharge Orders   Future Orders Complete By Expires   Call MD / Call 911  As directed    Comments:     If you experience chest pain or shortness of breath, CALL 911 and be transported to the hospital emergency room.  If you develope a fever above 101 F, pus (white drainage) or increased drainage or redness at the wound, or calf pain, call your surgeon's office.   Change dressing  As directed    Comments:     You may change your dressing on day 5, then change the dressing daily with sterile 4 x 4 inch gauze dressing and paper tape.  You may clean the incision with alcohol prior to redressing   Constipation Prevention  As directed    Comments:     Drink plenty of fluids.  Prune juice may be helpful.  You may use a stool softener, such as Colace (over the counter) 100 mg twice a day.  Use MiraLax (over the counter) for constipation as needed.   Diet - low sodium heart healthy  As directed    Discharge instructions  As directed    Comments:     Follow up in office with Dr. Mayer Camel in 2 weeks.   Discharge wound care:  As directed    Comments:     If you have a hip bandage, keep it clean and dry.  Change your bandage as instructed by your health care providers.  If your bandage has been discontinued, keep your incision clean  and dry.  Pat dry  after bathing.  DO NOT put lotion or powder on your incision.   DO NOT drive, shower or take a tub bath until instructed by your physician  As directed    Do not sit on low chairs, stoools or toilet seats, as it may be difficult to get up from low surfaces  As directed    Driving restrictions  As directed    Comments:     No driving for 2 weeks   Follow the hip precautions as taught in Physical Therapy  As directed    Increase activity slowly as tolerated  As directed    Patient may shower  As directed    Comments:     You may shower without a dressing once there is no drainage.  Do not wash over the wound.  If drainage remains, cover wound with plastic wrap and then shower.      Follow-up Information   Follow up with Kerin Salen, MD In 2 weeks.   Specialty:  Orthopedic Surgery   Contact information:   [place] [id number] [phone]        Signed: Theodosia Quay 12/03/2013, 11:42 AM

## 2013-12-02 NOTE — Progress Notes (Addendum)
CSW (Clinical Education officer, museum) received call from facility informing that pt cannot dc today and needs a 3 night stay for insurance. CSW informed pt and pt nurse. Pt was speaking with facility at the time so she was already aware. CSW will continue to follow and plan for dc tomorrow. Will need updated dc summary to reflect dc date for tomorrow.  [place], [place]

## 2013-12-02 NOTE — Progress Notes (Signed)
Physical Therapy Treatment Patient Details Name: [name] MRN: [id number] DOB: September 24, 1951 Today's Date: 12/02/2013 Time: 1962-2297 PT Time Calculation (min): 30 min  PT Assessment / Plan / Recommendation  History of Present Illness Pt s/p L THA, posterior precautions   PT Comments   Pt more fatigued this session, requires more frequent rest breaks.  Able to increase gait distance and perform all mobility with supervision.  Follow Up Recommendations  SNF     Does the patient have the potential to tolerate intense rehabilitation     Barriers to Discharge        Equipment Recommendations  None recommended by PT    Recommendations for Other Services    Frequency 7X/week   Progress towards PT Goals Progress towards PT goals: Progressing toward goals  Plan Current plan remains appropriate    Precautions / Restrictions Precautions Precautions: Posterior Hip Restrictions LLE Weight Bearing: Weight bearing as tolerated   Pertinent Vitals/Pain spO2 on room air 90-91% with activity.  HR at rest 109, with activity 119.  RN made aware.  Pt with no c/o pain during session.  Pt left in bed with O2 on 2L [place] per pt request (she wore O2 at night) RN made aware    Mobility  Bed Mobility Supine to sit: Supervision General bed mobility comments: pt able to move L LE with her hands Transfers Sit to Stand: Supervision Stand pivot transfers: Supervision General transfer comment: pt able to stand from bed, recliner and toilet with supervision, cues for safe hand placement Ambulation/Gait Ambulation/Gait assistance: Supervision Ambulation Distance (Feet): 125 Feet Assistive device: Rolling walker (2 wheeled) General Gait Details: pt able to increase gait distance today, no LOB, step through pattern    Exercises Total Joint Exercises Ankle Circles/Pumps: AROM;20 reps;Both Heel Slides: AROM;Left;20 reps (to 90 degrees) Long Arc Quad: AROM;20 reps;Left   PT Diagnosis:    PT Problem  List:   PT Treatment Interventions:     PT Goals (current goals can now be found in the care plan section)    Visit Information  Last PT Received On: 12/02/13 Assistance Needed: +1 History of Present Illness: Pt s/p L THA, posterior precautions    Subjective Data      Cognition  Cognition Arousal/Alertness: Awake/alert Behavior During Therapy: WFL for tasks assessed/performed Overall Cognitive Status: Within Functional Limits for tasks assessed    Balance     End of Session PT - End of Session Equipment Utilized During Treatment: Gait belt;Oxygen Activity Tolerance: Patient limited by fatigue Patient left: in bed;with call bell/phone within reach Nurse Communication: Mobility status   GP     Sable Knoles 12/02/2013, 11:36 AM

## 2013-12-02 NOTE — Progress Notes (Signed)
CSW (Clinical Education officer, museum) prepared pt dc packet and placed with shadow chart. Pt husband to provide transport. Facility and pt nurse informed. CSW signing off.  [place], [place]

## 2013-12-02 NOTE — Progress Notes (Signed)
PATIENT ID: Veronica BAUMGARNER  MRN: [id number]  DOB/AGE:  [birth year] / 63 y.o.  2 Days Post-Op Procedure(s) (LRB): TOTAL HIP REVISION (Left)    PROGRESS NOTE Subjective: Patient is alert, oriented,no Nausea, no Vomiting, yes passing gas, yes Bowel Movement. Taking PO well. Denies SOB, Chest or Calf Pain. Using Incentive Spirometer, PAS in place. Ambulate WBAT Patient reports pain as 5 on 0-10 scale  .    Objective: Vital signs in last 24 hours: Filed Vitals:   12/01/13 2051 12/02/13 0000 12/02/13 0400 12/02/13 0537  BP: 104/64   102/62  Pulse: 112   101  Temp: 100.2 F (37.9 C)   98.1 F (36.7 C)  TempSrc: Oral   Oral  Resp: 18 17 18 18   Height:      Weight:      SpO2: 89%  92% 97%      Intake/Output from previous day: I/O last 3 completed shifts: In: 1534.8 [P.O.:630; I.V.:904.8] Out: -    Intake/Output this shift:     LABORATORY DATA:  Recent Labs  12/01/13 0500 12/02/13 0420  WBC 6.0 7.4  HGB 10.5* 9.7*  HCT 32.1* 29.5*  PLT 163 155  NA 140  --   K 4.3  --   CL 100  --   CO2 24  --   BUN 9  --   CREATININE 0.70  --   GLUCOSE 147*  --   CALCIUM 8.5  --     Examination: Neurologically intact Neurovascular intact Sensation intact distally Intact pulses distally Dorsiflexion/Plantar flexion intact Incision: moderate drainage No cellulitis present Compartment soft} XR AP&Lat of hip shows well placed\fixed THA  Assessment:   2 Days Post-Op Procedure(s) (LRB): TOTAL HIP REVISION (Left) ADDITIONAL DIAGNOSIS:  Hypertension  Plan: PT/OT WBAT, THA  posterior precautions  DVT Prophylaxis: SCDx72 hrs, ASA 325 mg BID x 2 weeks  DISCHARGE PLAN: Skilled Nursing Facility/Rehab, [facility] when pt passes PT goals.  DISCHARGE NEEDS: HHPT, HHRN, Walker and 3-in-1 comode seat

## 2013-12-03 ENCOUNTER — Encounter (HOSPITAL_COMMUNITY): Payer: Self-pay | Admitting: Orthopedic Surgery

## 2013-12-03 LAB — CBC
HEMATOCRIT: 28.2 % — AB (ref 36.0–46.0)
HEMOGLOBIN: 9.1 g/dL — AB (ref 12.0–15.0)
MCH: 28.3 pg (ref 26.0–34.0)
MCHC: 32.3 g/dL (ref 30.0–36.0)
MCV: 87.9 fL (ref 78.0–100.0)
Platelets: 162 10*3/uL (ref 150–400)
RBC: 3.21 MIL/uL — ABNORMAL LOW (ref 3.87–5.11)
RDW: 15 % (ref 11.5–15.5)
WBC: 7 10*3/uL (ref 4.0–10.5)

## 2013-12-03 NOTE — Clinical Social Work Placement (Addendum)
    Clinical Social Work Department CLINICAL SOCIAL WORK PLACEMENT NOTE 12/03/2013  Patient:  [name], [name]  Account Number:  [id number] Admit date:  11/30/2013  Clinical Social Worker:  Adair Laundry  Date/time:  11/30/2013 03:30 PM  Clinical Social Work is seeking post-discharge placement for this patient at the following level of care:   SKILLED NURSING   (*CSW will update this form in Epic as items are completed)   11/30/2013  Patient/family provided with [facility] Department of Clinical Social Work's list of facilities offering this level of care within the geographic area requested by the patient (or if unable, by the patient's family).  11/30/2013  Patient/family informed of their freedom to choose among providers that offer the needed level of care, that participate in Medicare, Medicaid or managed care program needed by the patient, have an available bed and are willing to accept the patient.  11/30/2013  Patient/family informed of [facility]' ownership interest in [facility], as well as of the fact that they are under no obligation to receive care at this facility.  PASARR submitted to [employer] on  PASARR number received from [place] on   FL2 transmitted to all facilities in geographic area requested by pt/family on  11/30/2013 FL2 transmitted to all facilities within larger geographic area on   Patient informed that his/her managed care company has contracts with or will negotiate with  certain facilities, including the following:   NA  PASARR number in place.     Patient/family informed of bed offers received:  12/02/2013 Patient chooses bed at [facility] Physician recommends and patient chooses bed at    Patient to be transferred to [facility] on  12/03/2013 Patient to be transferred to facility by [facility] with husband and daughter  The following physician request were entered in Epic:   Additional Comments: 12/03/13  Ok per MD for d/c today to  SNF.  Patient stated that she was happy and ready to begin her rehab so that she can return home. She deneid any concerns or worries about rehab.  Her daughter and husband will transport her to the facility. CSW did not have the opportunity to meet them. CSW signing off.  Lorie Phenix. [place], [place]

## 2013-12-03 NOTE — Progress Notes (Signed)
Patient ID: [name], female   DOB: June 12, 1951, 63 y.o.   MRN: [id number] PATIENT ID: [name]  MRN: [id number]  DOB/AGE:  [birth year] / 63 y.o.  3 Days Post-Op Procedure(s) (LRB): TOTAL HIP REVISION (Left)    PROGRESS NOTE Subjective: Patient is alert, oriented,no Nausea, no Vomiting, yes passing gas, no Bowel Movement. Taking PO well. Denies SOB, Chest or Calf Pain. Using Incentive Spirometer, PAS in place. Ambulate WBAT Patient reports pain as 2 on 0-10 scale  .    Objective: Vital signs in last 24 hours: Filed Vitals:   12/02/13 1356 12/02/13 2134 12/03/13 0551 12/03/13 0800  BP: 111/59 109/60 114/64   Pulse: 103 101 90   Temp: 98.7 F (37.1 C) 98.8 F (37.1 C) 98.9 F (37.2 C)   TempSrc: Oral Oral Oral   Resp: 18 18 18 18   Height:      Weight:      SpO2: 95% 95% 96%       Intake/Output from previous day: I/O last 3 completed shifts: In: 41 [P.O.:460] Out: -    Intake/Output this shift:     LABORATORY DATA:  Recent Labs  12/01/13 0500 12/02/13 0420 12/03/13 0414  WBC 6.0 7.4 7.0  HGB 10.5* 9.7* 9.1*  HCT 32.1* 29.5* 28.2*  PLT 163 155 162  NA 140  --   --   K 4.3  --   --   CL 100  --   --   CO2 24  --   --   BUN 9  --   --   CREATININE 0.70  --   --   GLUCOSE 147*  --   --   CALCIUM 8.5  --   --     Examination: Neurologically intact ABD soft Neurovascular intact Sensation intact distally Intact pulses distally Dorsiflexion/Plantar flexion intact Incision: no drainage No cellulitis present Compartment soft} XR AP&Lat of hip shows well placed\fixed THA  Assessment:   3 Days Post-Op Procedure(s) (LRB): TOTAL HIP REVISION (Left) ADDITIONAL DIAGNOSIS:  Hypertension  Plan: PT/OT WBAT, THA  posterior precautions  DVT Prophylaxis: SCDx72 hrs, ASA 325 mg BID x 2 weeks  DISCHARGE PLAN: Skilled Nursing Facility/Rehab, patient will be going to [facility] today weightbearing as tolerated  DISCHARGE NEEDS: HHPT, HHRN, CPM, Walker  and 3-in-1 comode seat

## 2013-12-04 ENCOUNTER — Encounter: Payer: Self-pay | Admitting: Adult Health

## 2013-12-04 ENCOUNTER — Non-Acute Institutional Stay (SKILLED_NURSING_FACILITY): Admitting: Adult Health

## 2013-12-04 DIAGNOSIS — G4700 Insomnia, unspecified: Secondary | ICD-10-CM | POA: Insufficient documentation

## 2013-12-04 DIAGNOSIS — D62 Acute posthemorrhagic anemia: Secondary | ICD-10-CM

## 2013-12-04 DIAGNOSIS — Z96649 Presence of unspecified artificial hip joint: Secondary | ICD-10-CM

## 2013-12-04 DIAGNOSIS — M25559 Pain in unspecified hip: Secondary | ICD-10-CM

## 2013-12-04 DIAGNOSIS — F5104 Psychophysiologic insomnia: Secondary | ICD-10-CM | POA: Insufficient documentation

## 2013-12-04 DIAGNOSIS — M25552 Pain in left hip: Secondary | ICD-10-CM | POA: Insufficient documentation

## 2013-12-04 DIAGNOSIS — E785 Hyperlipidemia, unspecified: Secondary | ICD-10-CM | POA: Insufficient documentation

## 2013-12-04 DIAGNOSIS — I10 Essential (primary) hypertension: Secondary | ICD-10-CM | POA: Insufficient documentation

## 2013-12-04 DIAGNOSIS — K5900 Constipation, unspecified: Secondary | ICD-10-CM

## 2013-12-04 NOTE — Progress Notes (Signed)
Patient ID: [name], female   DOB: [birth year], 63 y.o.   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 She has been admitted for a short-term rehabilitation.   REASSESSMENT OF ONGOING PROBLEM(S):  HTN: Pt 's HTN remains stable.  Denies CP, sob, DOE, pedal edema, headaches, dizziness or visual disturbances.  No complications from the medications currently being used.  Last BP : 103/59  INSOMNIA: The insomnia remains stable.  No complications noted from the medications presently being used. Patient denies ongoing insomnia, pain, hallucinations, delusions.  ANEMIA: The anemia has been stable. The patient denies fatigue, melena or hematochezia. No complications from the medications currently being used. 2/15 hgb 9.1   PAST MEDICAL HISTORY : Reviewed.  No changes.  CURRENT MEDICATIONS: Reviewed per [facility]  REVIEW OF SYSTEMS:  GENERAL: no change in appetite, no fatigue, no weight changes, no fever, chills or weakness RESPIRATORY: no cough, SOB, DOE, wheezing, hemoptysis CARDIAC: no chest pain, edema or palpitations GI: no abdominal pain, diarrhea, heart burn, nausea or vomiting,+constipation  PHYSICAL EXAMINATION  GENERAL: no acute distress, normal body habitus EYES: conjunctivae normal, sclerae normal, normal eye lids NECK: supple, trachea midline, no neck masses, no thyroid tenderness, no thyromegaly LYMPHATICS: no LAN in the neck, no supraclavicular LAN RESPIRATORY: breathing is even & unlabored, BS CTAB CARDIAC: RRR, no murmur,no extra heart sounds, no edema GI: abdomen soft, normal BS, no masses, no tenderness, no  hepatomegaly, no splenomegaly PSYCHIATRIC: the patient is alert & oriented to person, affect & behavior appropriate  LABS/RADIOLOGY: Labs reviewed: Basic Metabolic Panel:  Recent Labs  10/17/13 1722 11/26/13 1600 12/01/13 0500  NA 139 142 140  K 3.8 4.4 4.3  CL 100 103 100  CO2  --  24 24  GLUCOSE 107* 94 147*  BUN 24* 22 9  CREATININE 1.10 0.69 0.70  CALCIUM  --  9.2 8.5   CBC:  Recent Labs  10/17/13 1712  11/26/13 1600 12/01/13 0500 12/02/13 0420 12/03/13 0414  WBC 8.3  --  6.9 6.0 7.4 7.0  NEUTROABS 5.2  --  4.4  --   --   --   HGB 14.0  < > 13.7 10.5* 9.7* 9.1*  HCT 41.4  < > 41.3 32.1* 29.5* 28.2*  MCV 86.4  --  88.1 88.7 88.9 87.9  PLT 189  --  200 163 155 162  < > = values in this interval not displayed.    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

## 2013-12-07 ENCOUNTER — Other Ambulatory Visit: Payer: Self-pay | Admitting: *Deleted

## 2013-12-07 MED ORDER — HYDROMORPHONE HCL 2 MG PO TABS: ORAL_TABLET | ORAL | Status: DC

## 2013-12-07 NOTE — Telephone Encounter (Signed)
[facility] 

## 2013-12-12 ENCOUNTER — Non-Acute Institutional Stay (SKILLED_NURSING_FACILITY): Admitting: Adult Health

## 2013-12-12 DIAGNOSIS — G4700 Insomnia, unspecified: Secondary | ICD-10-CM

## 2013-12-12 DIAGNOSIS — E785 Hyperlipidemia, unspecified: Secondary | ICD-10-CM

## 2013-12-12 DIAGNOSIS — Z96649 Presence of unspecified artificial hip joint: Secondary | ICD-10-CM

## 2013-12-12 DIAGNOSIS — D62 Acute posthemorrhagic anemia: Secondary | ICD-10-CM

## 2013-12-12 DIAGNOSIS — K5900 Constipation, unspecified: Secondary | ICD-10-CM

## 2013-12-12 DIAGNOSIS — I10 Essential (primary) hypertension: Secondary | ICD-10-CM

## 2013-12-14 ENCOUNTER — Encounter: Payer: Self-pay | Admitting: *Deleted

## 2013-12-15 ENCOUNTER — Encounter: Payer: Self-pay | Admitting: Adult Health

## 2013-12-15 NOTE — Progress Notes (Signed)
Patient ID: [name], female   DOB: Nov 12, 1950, 63 y.o.   MRN: [id number]         PROGRESS NOTE  DATE: 12/12/13  FACILITY: Nursing Home Location: [facility]  LEVEL OF CARE: SNF (31)  Acute Visit  CHIEF COMPLAINT:  Discharge Notes  HISTORY OF PRESENT ILLNESS: This is a 63 year old female who is for discharge home with Home health PT, OT and Nursing. DME: Lo[employer] stand crutch pair. 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 Patient was admitted to this facility for short-term rehabilitation after the patient's recent hospitalization.  Patient has completed SNF rehabilitation and therapy has cleared the patient for discharge.   REASSESSMENT OF ONGOING PROBLEM(S):  HTN: Pt 's HTN remains stable.  Denies CP, sob, DOE, pedal edema, headaches, dizziness or visual disturbances.  No complications from the medications currently being used.  Last BP : 104/70  HYPERLIPIDEMIA: No complications from the medications presently being used.   ANEMIA: The anemia has been stable. The patient denies fatigue, melena or hematochezia. No complications from the medications currently being used. 2/15 hgb 9.1   PAST MEDICAL HISTORY : Reviewed.  No changes.  CURRENT MEDICATIONS: Reviewed per [facility]  REVIEW OF SYSTEMS:  GENERAL: no change in appetite, no fatigue, no weight changes, no fever, chills or weakness RESPIRATORY: no cough, SOB, DOE, wheezing, hemoptysis CARDIAC: no chest pain, edema or palpitations GI: no abdominal pain, diarrhea, heart burn, nausea or vomiting,  PHYSICAL EXAMINATION  GENERAL: no acute distress, normal body habitus NECK: supple, trachea midline, no neck masses, no thyroid tenderness, no thyromegaly LYMPHATICS: no LAN in the neck, no supraclavicular LAN RESPIRATORY: breathing is even & unlabored, BS CTAB CARDIAC: RRR, no murmur,no extra heart sounds, no edema GI: abdomen  soft, normal BS, no masses, no tenderness, no hepatomegaly, no splenomegaly PSYCHIATRIC: the patient is alert & oriented to person, affect & behavior appropriate  LABS/RADIOLOGY: Labs reviewed: Basic Metabolic Panel:  Recent Labs  10/17/13 1722 11/26/13 1600 12/01/13 0500  NA 139 142 140  K 3.8 4.4 4.3  CL 100 103 100  CO2  --  24 24  GLUCOSE 107* 94 147*  BUN 24* 22 9  CREATININE 1.10 0.69 0.70  CALCIUM  --  9.2 8.5   CBC:  Recent Labs  10/17/13 1712  11/26/13 1600 12/01/13 0500 12/02/13 0420 12/03/13 0414  WBC 8.3  --  6.9 6.0 7.4 7.0  NEUTROABS 5.2  --  4.4  --   --   --   HGB 14.0  < > 13.7 10.5* 9.7* 9.1*  HCT 41.4  < > 41.3 32.1* 29.5* 28.2*  MCV 86.4  --  88.1 88.7 88.9 87.9  PLT 189  --  200 163 155 162  < > = values in this interval not displayed.    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  Patient will receive home health PT, OT and Nursing.  DME provided: Loft stand crutch pair  Total discharge time: Greater than 30 minutes Discharge time involved coordination of the discharge process with Education officer, museum, nursing staff and therapy department. Medical justification for home health services/DME verified.  CPT CODE: 36644  Seth Bake - NP [employer] [phone]

## 2014-03-30 ENCOUNTER — Encounter (HOSPITAL_COMMUNITY): Payer: Self-pay | Admitting: Emergency Medicine

## 2014-03-30 ENCOUNTER — Emergency Department (HOSPITAL_COMMUNITY)

## 2014-03-30 ENCOUNTER — Emergency Department (HOSPITAL_COMMUNITY)
Admission: EM | Admit: 2014-03-30 | Discharge: 2014-03-31 | Disposition: A | Discharge status: Home / Self Care | Attending: Emergency Medicine | Admitting: Emergency Medicine

## 2014-03-30 DIAGNOSIS — Z8619 Personal history of other infectious and parasitic diseases: Secondary | ICD-10-CM | POA: Insufficient documentation

## 2014-03-30 DIAGNOSIS — R55 Syncope and collapse: Secondary | ICD-10-CM | POA: Insufficient documentation

## 2014-03-30 DIAGNOSIS — E785 Hyperlipidemia, unspecified: Secondary | ICD-10-CM | POA: Insufficient documentation

## 2014-03-30 DIAGNOSIS — R0789 Other chest pain: Secondary | ICD-10-CM

## 2014-03-30 DIAGNOSIS — Z86711 Personal history of pulmonary embolism: Secondary | ICD-10-CM | POA: Insufficient documentation

## 2014-03-30 DIAGNOSIS — R011 Cardiac murmur, unspecified: Secondary | ICD-10-CM | POA: Insufficient documentation

## 2014-03-30 DIAGNOSIS — R071 Chest pain on breathing: Secondary | ICD-10-CM | POA: Insufficient documentation

## 2014-03-30 DIAGNOSIS — Z7982 Long term (current) use of aspirin: Secondary | ICD-10-CM | POA: Insufficient documentation

## 2014-03-30 DIAGNOSIS — Z91040 Latex allergy status: Secondary | ICD-10-CM | POA: Insufficient documentation

## 2014-03-30 DIAGNOSIS — Z8719 Personal history of other diseases of the digestive system: Secondary | ICD-10-CM | POA: Insufficient documentation

## 2014-03-30 DIAGNOSIS — F411 Generalized anxiety disorder: Secondary | ICD-10-CM | POA: Insufficient documentation

## 2014-03-30 DIAGNOSIS — I10 Essential (primary) hypertension: Secondary | ICD-10-CM | POA: Insufficient documentation

## 2014-03-30 DIAGNOSIS — Z880 Allergy status to penicillin: Secondary | ICD-10-CM | POA: Insufficient documentation

## 2014-03-30 DIAGNOSIS — M129 Arthropathy, unspecified: Secondary | ICD-10-CM | POA: Insufficient documentation

## 2014-03-30 LAB — CBC WITH DIFFERENTIAL/PLATELET
Basophils Absolute: 0 10*3/uL (ref 0.0–0.1)
Basophils Relative: 0 % (ref 0–1)
Eosinophils Absolute: 0.1 10*3/uL (ref 0.0–0.7)
Eosinophils Relative: 1 % (ref 0–5)
HCT: 40.8 % (ref 36.0–46.0)
Hemoglobin: 13.7 g/dL (ref 12.0–15.0)
Lymphocytes Relative: 51 % — ABNORMAL HIGH (ref 12–46)
Lymphs Abs: 3.3 10*3/uL (ref 0.7–4.0)
MCH: 28.4 pg (ref 26.0–34.0)
MCHC: 33.6 g/dL (ref 30.0–36.0)
MCV: 84.5 fL (ref 78.0–100.0)
Monocytes Absolute: 0.5 10*3/uL (ref 0.1–1.0)
Monocytes Relative: 7 % (ref 3–12)
Neutro Abs: 2.7 10*3/uL (ref 1.7–7.7)
Neutrophils Relative %: 41 % — ABNORMAL LOW (ref 43–77)
Platelets: 204 10*3/uL (ref 150–400)
RBC: 4.83 MIL/uL (ref 3.87–5.11)
RDW: 17.2 % — ABNORMAL HIGH (ref 11.5–15.5)
WBC: 6.5 10*3/uL (ref 4.0–10.5)

## 2014-03-30 LAB — BASIC METABOLIC PANEL
BUN: 28 mg/dL — ABNORMAL HIGH (ref 6–23)
CO2: 24 mEq/L (ref 19–32)
Calcium: 9.5 mg/dL (ref 8.4–10.5)
Chloride: 99 mEq/L (ref 96–112)
Creatinine, Ser: 0.67 mg/dL (ref 0.50–1.10)
GFR calc Af Amer: >90 mL/min (ref >90)
GFR calc non Af Amer: >90 mL/min (ref >90)
Glucose, Bld: 107 mg/dL — ABNORMAL HIGH (ref 70–99)
Potassium: 4.2 mEq/L (ref 3.7–5.3)
Sodium: 141 mEq/L (ref 137–147)

## 2014-03-30 LAB — TROPONIN I: Troponin I: <0.3 ng/mL (ref <0.30)

## 2014-03-30 MED ORDER — MORPHINE SULFATE 4 MG/ML IJ SOLN
4.0000 mg | Freq: Once | INTRAMUSCULAR | Status: AC
  Administered 2014-03-30: 4 mg via INTRAVENOUS
  Filled 2014-03-30: qty 1

## 2014-03-30 NOTE — ED Provider Notes (Signed)
CSN: [id number]     Arrival date & time 03/30/14  2202 History   First MD Initiated Contact with Patient 03/30/14 2307     Chief Complaint  Patient presents with  . Chest Pain     (Consider location/radiation/quality/duration/timing/severity/associated sxs/prior Treatment) HPI Patient presents with gradual onset left lower chest pain starting at 8 AM this morning. She has a history of pulmonary embolism but is no longer on any blood thinners. She denies fevers or chills. She's had multiple breath worse with exertion. She states she's had left leg cramps which is now resolved. Her chest pain is worse with palpation. She recently had a left hip revision. Patient's chest pain does not radiate. Described as sharp. States she had a possible brief episode of syncope this evening around 5:30 PM. She denies any head or neck trauma.  Past Medical History  Diagnosis Date  . Hypertension     takes Maxzide daily  . Heart murmur     takes Bystolic nightly  . Hyperlipidemia     takes Crestor nightly  . History of blood clots 31yrs    was on Lovenox injections and Coumadin(was only on that for short period of time)  . H/O migraine     last 29yrs ago  . Arthritis   . Joint pain   . Joint swelling   . Hx of seasonal allergies     takes Levocetirizine prn  . Diverticulosis   . H/O blood transfusion reaction 2010    hives  . Anxiety     takes Xanax prn  . Insomnia     takes Ambien prn  . History of shingles 3-73yrs ago  . GGEZMOQH(476.5)    Past Surgical History  Procedure Laterality Date  . Breast reduction surgery  1987  . Joint replacement      bilateral hip rt in 2007 and lt in 2010  . Cholecystectomy    . Abdominal hysterectomy    . Colonoscopy    . Esophagogastroduodenoscopy    . Total hip revision  04/14/2012    Procedure: TOTAL HIP REVISION;  Surgeon: Kerin Salen, MD;  Location: [place];  Service: Orthopedics;  Laterality: Right;  right total hip revision  . Eye surgery Right      eye lash removed  . Total hip revision Left 11/30/2013    Procedure: TOTAL HIP REVISION;  Surgeon: Kerin Salen, MD;  Location: [place];  Service: Orthopedics;  Laterality: Left;   No family history on file. History  Substance Use Topics  . Smoking status: Passive Smoke Exposure - Never Smoker -- 42 years    Types: Cigarettes  . Smokeless tobacco: Never Used  . Alcohol Use: Yes     Comment: occasionally   OB History   Grav Para Term Preterm Abortions TAB SAB Ect Mult Living                 Review of Systems  Constitutional: Negative for fever and chills.  Respiratory: Positive for shortness of breath. Negative for cough.   Cardiovascular: Positive for chest pain. Negative for palpitations and leg swelling.  Gastrointestinal: Negative for nausea, vomiting and abdominal pain.  Musculoskeletal: Negative for back pain, neck pain and neck stiffness.  Skin: Negative for rash and wound.  Neurological: Positive for dizziness, syncope and light-headedness. Negative for seizures, weakness, numbness and headaches.  All other systems reviewed and are negative.     Allergies  Ibuprofen; Latex; and Penicillins  Home Medications  Prior to Admission medications   Medication Sig Start Date End Date Taking? Authorizing Provider  acidophilus (RISAQUAD) CAPS capsule Take 1 capsule by mouth daily.   Yes Historical Provider, MD  aspirin EC 325 MG tablet Take 325 mg by mouth 2 (two) times daily. For prophylaxis 11/30/13  Yes Leighton Parody, PA-C  aspirin-sod bicarb-citric acid (ALKA-SELTZER) 325 MG TBEF tablet Take 325 mg by mouth every 6 (six) hours as needed (indigestion).   Yes Historical Provider, MD  cholecalciferol (VITAMIN D) 1000 UNITS tablet Take 1,000 Units by mouth daily.   Yes Historical Provider, MD  HYDROmorphone (DILAUDID) 2 MG tablet Take 2 mg by mouth every 4 (four) hours as needed for moderate pain or severe pain.   Yes Historical Provider, MD  levocetirizine (XYZAL) 5 MG tablet  Take 5 mg by mouth daily as needed for allergies.    Yes Historical Provider, MD  nebivolol (BYSTOLIC) 5 MG tablet Take 5 mg by mouth every evening. For HTN   Yes Historical Provider, MD  rosuvastatin (CRESTOR) 10 MG tablet Take 10 mg by mouth every evening. For hyperlipidemia   Yes Historical Provider, MD  triamterene-hydrochlorothiazide (MAXZIDE-25) 37.5-25 MG per tablet Take 1 tablet by mouth daily. For HTN   Yes Historical Provider, MD  zolpidem (AMBIEN) 5 MG tablet Take 5 mg by mouth at bedtime as needed for sleep.   Yes Historical Provider, MD   BP 113/68  Pulse 85  Temp(Src) 98.1 F (36.7 C) (Oral)  Resp 21  SpO2 97% Physical Exam  Nursing note and vitals reviewed. Constitutional: She is oriented to person, place, and time. She appears well-developed and well-nourished. No distress.  HENT:  Head: Normocephalic and atraumatic.  Mouth/Throat: Oropharynx is clear and moist.  Eyes: EOM are normal. Pupils are equal, round, and reactive to light.  Neck: Normal range of motion. Neck supple.  No posterior midline cervical tenderness to palpation.  Cardiovascular: Normal rate and regular rhythm.   Murmur heard. Pulmonary/Chest: Effort normal and breath sounds normal. No respiratory distress. She has no wheezes. She has no rales. She exhibits tenderness (chest tenderness reproduced with palpation of the left lower anterior chest. There is no crepitance or deformity.).  Abdominal: Soft. Bowel sounds are normal. She exhibits no distension and no mass. There is no tenderness. There is no rebound and no guarding.  Musculoskeletal: Normal range of motion. She exhibits no edema and no tenderness.  No calf tenderness or swelling.  Neurological: She is alert and oriented to person, place, and time.  Patient is alert and oriented x3 with clear, goal oriented speech. Patient has 5/5 motor in all extremities. Sensation is intact to light touch. Bilateral finger-to-nose is normal with no signs of  dysmetria. Patient has a normal gait and walks without assistance.   Skin: Skin is warm and dry. No rash noted. No erythema.  Psychiatric: She has a normal mood and affect. Her behavior is normal.    ED Course  Procedures (including critical care time) Labs Review Labs Reviewed  BASIC METABOLIC PANEL - Abnormal; Notable for the following:    Glucose, Bld 107 (*)    BUN 28 (*)    All other components within normal limits  CBC WITH DIFFERENTIAL - Abnormal; Notable for the following:    RDW 17.2 (*)    Neutrophils Relative % 41 (*)    Lymphocytes Relative 51 (*)    All other components within normal limits  TROPONIN I    Imaging Review Dg Chest  2 View  03/30/2014   CLINICAL DATA:  Chest pain  EXAM: CHEST  2 VIEW  COMPARISON:  11/26/2013  FINDINGS: The heart size and mediastinal contours are within normal limits. Both lungs are clear. The visualized skeletal structures are unremarkable.  IMPRESSION: No active cardiopulmonary disease.   Electronically Signed   By: Lucienne Capers M.D.   On: 03/30/2014 23:03     EKG Interpretation   Date/Time:  Tuesday March 30 2014 22:29:43 EDT Ventricular Rate:  101 PR Interval:  127 QRS Duration: 75 QT Interval:  328 QTC Calculation: 425 R Axis:   12 Text Interpretation:  Sinus tachycardia Probable left atrial enlargement  Nonspecific T abnormalities, diffuse leads Confirmed by Donold Marotto  MD,  Phoenix Dresser ([id number]) on 03/30/2014 11:12:55 PM      MDM   Final diagnoses:  None    No evidence of PE on CT of chest. Pain improved with morphine. Will get dealt a troponin to rule out any cardiac ischemia though given tenderness to palpation I have a low suspicion of  coronary artery disease   Second troponin is normal. Patient pain continues to be reproduced with palpation. Discharge home with pain medication and she's been given thorough return precautions.  Julianne Rice, MD 03/31/14 [phone]

## 2014-03-30 NOTE — ED Notes (Signed)
Pt reports L cp which started at 0800 today, reports SOB and dizziness with it.  Pt also reports syncopal episode at around 1730.  Pt describes pain as tight and sharp and non-radiating.

## 2014-03-30 NOTE — ED Notes (Signed)
Pt's husband reports that pt had a syncopal episode around 5:30 pm and pt did fall. Pt says that she does not recall hitting her head. Pt is alert and oriented at this time.

## 2014-03-31 LAB — I-STAT TROPONIN, ED: Troponin i, poc: 0.02 ng/mL (ref 0.00–0.08)

## 2014-03-31 MED ORDER — IOHEXOL 350 MG/ML SOLN
100.0000 mL | Freq: Once | INTRAVENOUS | Status: AC | PRN
  Administered 2014-03-31: 100 mL via INTRAVENOUS

## 2014-03-31 MED ORDER — MORPHINE SULFATE 4 MG/ML IJ SOLN
4.0000 mg | Freq: Once | INTRAMUSCULAR | Status: AC
Start: 2014-03-31 — End: 2014-03-31
  Administered 2014-03-31: 4 mg via INTRAVENOUS
  Filled 2014-03-31: qty 1

## 2014-03-31 MED ORDER — ONDANSETRON HCL 4 MG/2ML IJ SOLN
4.0000 mg | Freq: Once | INTRAMUSCULAR | Status: AC
  Administered 2014-03-31: 4 mg via INTRAVENOUS
  Filled 2014-03-31: qty 2

## 2014-03-31 MED ORDER — HYDROCODONE-ACETAMINOPHEN 5-325 MG PO TABS: 1.0000 | ORAL_TABLET | ORAL | Status: DC | PRN

## 2014-03-31 NOTE — ED Notes (Signed)
See downtime forms for previous notes.

## 2014-03-31 NOTE — Discharge Instructions (Signed)

## 2014-06-06 ENCOUNTER — Encounter (HOSPITAL_COMMUNITY): Payer: Self-pay | Admitting: Emergency Medicine

## 2014-06-06 ENCOUNTER — Emergency Department (HOSPITAL_COMMUNITY)
Admission: EM | Admit: 2014-06-06 | Discharge: 2014-06-06 | Disposition: A | Discharge status: Home / Self Care | Attending: Emergency Medicine | Admitting: Emergency Medicine

## 2014-06-06 DIAGNOSIS — I10 Essential (primary) hypertension: Secondary | ICD-10-CM | POA: Insufficient documentation

## 2014-06-06 DIAGNOSIS — Z8669 Personal history of other diseases of the nervous system and sense organs: Secondary | ICD-10-CM | POA: Insufficient documentation

## 2014-06-06 DIAGNOSIS — Z8719 Personal history of other diseases of the digestive system: Secondary | ICD-10-CM | POA: Insufficient documentation

## 2014-06-06 DIAGNOSIS — Z8739 Personal history of other diseases of the musculoskeletal system and connective tissue: Secondary | ICD-10-CM | POA: Insufficient documentation

## 2014-06-06 DIAGNOSIS — E785 Hyperlipidemia, unspecified: Secondary | ICD-10-CM | POA: Insufficient documentation

## 2014-06-06 DIAGNOSIS — R1110 Vomiting, unspecified: Secondary | ICD-10-CM | POA: Insufficient documentation

## 2014-06-06 DIAGNOSIS — Z90710 Acquired absence of both cervix and uterus: Secondary | ICD-10-CM | POA: Insufficient documentation

## 2014-06-06 DIAGNOSIS — S0990XA Unspecified injury of head, initial encounter: Secondary | ICD-10-CM | POA: Insufficient documentation

## 2014-06-06 DIAGNOSIS — Z86718 Personal history of other venous thrombosis and embolism: Secondary | ICD-10-CM | POA: Insufficient documentation

## 2014-06-06 DIAGNOSIS — G4700 Insomnia, unspecified: Secondary | ICD-10-CM | POA: Insufficient documentation

## 2014-06-06 DIAGNOSIS — R011 Cardiac murmur, unspecified: Secondary | ICD-10-CM | POA: Insufficient documentation

## 2014-06-06 DIAGNOSIS — W2209XA Striking against other stationary object, initial encounter: Secondary | ICD-10-CM | POA: Insufficient documentation

## 2014-06-06 DIAGNOSIS — Y9389 Activity, other specified: Secondary | ICD-10-CM | POA: Insufficient documentation

## 2014-06-06 DIAGNOSIS — Z91040 Latex allergy status: Secondary | ICD-10-CM | POA: Insufficient documentation

## 2014-06-06 DIAGNOSIS — Y9289 Other specified places as the place of occurrence of the external cause: Secondary | ICD-10-CM | POA: Insufficient documentation

## 2014-06-06 DIAGNOSIS — Z8619 Personal history of other infectious and parasitic diseases: Secondary | ICD-10-CM | POA: Insufficient documentation

## 2014-06-06 DIAGNOSIS — Z79899 Other long term (current) drug therapy: Secondary | ICD-10-CM | POA: Insufficient documentation

## 2014-06-06 DIAGNOSIS — Z880 Allergy status to penicillin: Secondary | ICD-10-CM | POA: Insufficient documentation

## 2014-06-06 DIAGNOSIS — Z8659 Personal history of other mental and behavioral disorders: Secondary | ICD-10-CM | POA: Insufficient documentation

## 2014-06-06 NOTE — Discharge Instructions (Signed)
Use Tylenol, or Motrin as needed for pain. Return here, or see your doctor, as needed for problems.  Head Injury You have a head injury. Headaches and throwing up (vomiting) are common after a head injury. It should be easy to wake up from sleeping. Sometimes you must stay in the hospital. Most problems happen within the first 24 hours. Side effects may occur up to 7-10 days after the injury.  WHAT ARE THE TYPES OF HEAD INJURIES? Head injuries can be as minor as a bump. Some head injuries can be more severe. More severe head injuries include:  A jarring injury to the brain (concussion).  A bruise of the brain (contusion). This mean there is bleeding in the brain that can cause swelling.  A cracked skull (skull fracture).  Bleeding in the brain that collects, clots, and forms a bump (hematoma). WHEN SHOULD I GET HELP RIGHT AWAY?   You are confused or sleepy.  You cannot be woken up.  You feel sick to your stomach (nauseous) or keep throwing up (vomiting).  Your dizziness or unsteadiness is getting worse.  You have very bad, lasting headaches that are not helped by medicine. Take medicines only as told by your doctor.  You cannot use your arms or legs like normal.  You cannot walk.  You notice changes in the black spots in the center of the colored part of your eye (pupil).  You have clear or bloody fluid coming from your nose or ears.  You have trouble seeing. During the next 24 hours after the injury, you must stay with someone who can watch you. This person should get help right away (call 911 in the [place] if you start to shake and are not able to control it (have seizures), you pass out, or you are unable to wake up. HOW CAN I PREVENT A HEAD INJURY IN THE FUTURE?  Wear seat belts.  Wear a helmet while bike riding and playing sports like football.  Stay away from dangerous activities around the house. WHEN CAN I RETURN TO NORMAL ACTIVITIES AND ATHLETICS? See your doctor  before doing these activities. You should not do normal activities or play contact sports until 1 week after the following symptoms have stopped:  Headache that does not go away.  Dizziness.  Poor attention.  Confusion.  Memory problems.  Sickness to your stomach or throwing up.  Tiredness.  Fussiness.  Bothered by bright lights or loud noises.  Anxiousness or depression.  Restless sleep. MAKE SURE YOU:   Understand these instructions.  Will watch your condition.  Will get help right away if you are not doing well or get worse. Document Released: 09/20/2008 Document Revised: 02/22/2014 Document Reviewed: 06/15/2013 [facility] Patient Information 2015 [place], [place]. This information is not intended to replace advice given to you by your health care provider. Make sure you discuss any questions you have with your health care provider.

## 2014-06-06 NOTE — ED Notes (Signed)
Pt c/o head pain after hitting her head on the freezer door on Tuesday but denies LOC. Pt states that this morning she vomited twice once was dry heave before she ate then second time was her breakfast.

## 2014-06-06 NOTE — ED Provider Notes (Signed)
CSN: [id number]     Arrival date & time 06/06/14  1114 History   First MD Initiated Contact with Patient 06/06/14 1228     Chief Complaint  Patient presents with  . Head Injury  . Emesis     (Consider location/radiation/quality/duration/timing/severity/associated sxs/prior Treatment) HPI  [name] is a 63 y.o. female who presents for evaluation of an injury in a single episode of vomiting. The injury occurred 5 days ago, when she stood up and struck her head against a freezer door. Since then she has had a soreness of her head where she impacted the door. Today, she awoke and felt nauseated and vomited once. She has not had persistent vomiting. She denies dizziness, paresthesias, or weakness. No prior head injuries. She is taking her usual medications, without relief. There are no other known modifying factors.   Past Medical History  Diagnosis Date  . Hypertension     takes Maxzide daily  . Heart murmur     takes Bystolic nightly  . Hyperlipidemia     takes Crestor nightly  . History of blood clots 57yrs    was on Lovenox injections and Coumadin(was only on that for short period of time)  . H/O migraine     last 22yrs ago  . Arthritis   . Joint pain   . Joint swelling   . Hx of seasonal allergies     takes Levocetirizine prn  . Diverticulosis   . H/O blood transfusion reaction 2010    hives  . Anxiety     takes Xanax prn  . Insomnia     takes Ambien prn  . History of shingles 3-6yrs ago  . OEUMPNTI(144.3)    Past Surgical History  Procedure Laterality Date  . Breast reduction surgery  1987  . Joint replacement      bilateral hip rt in 2007 and lt in 2010  . Cholecystectomy    . Abdominal hysterectomy    . Colonoscopy    . Esophagogastroduodenoscopy    . Total hip revision  04/14/2012    Procedure: TOTAL HIP REVISION;  Surgeon: Kerin Salen, MD;  Location: [place];  Service: Orthopedics;  Laterality: Right;  right total hip revision  . Eye surgery Right    eye lash removed  . Total hip revision Left 11/30/2013    Procedure: TOTAL HIP REVISION;  Surgeon: Kerin Salen, MD;  Location: [place];  Service: Orthopedics;  Laterality: Left;   No family history on file. History  Substance Use Topics  . Smoking status: Passive Smoke Exposure - Never Smoker -- 42 years    Types: Cigarettes  . Smokeless tobacco: Never Used  . Alcohol Use: Yes     Comment: occasionally   OB History   Grav Para Term Preterm Abortions TAB SAB Ect Mult Living                 Review of Systems  All other systems reviewed and are negative.     Allergies  Ibuprofen; Latex; and Penicillins  Home Medications   Prior to Admission medications   Medication Sig Start Date End Date Taking? Authorizing Provider  aspirin-sod bicarb-citric acid (ALKA-SELTZER) 325 MG TBEF tablet Take 325 mg by mouth every 6 (six) hours as needed (indigestion).   Yes Historical Provider, MD  Bepotastine Besilate (BEPREVE) 1.5 % SOLN Place 1 drop into both eyes 2 (two) times daily.   Yes Historical Provider, MD  levocetirizine (XYZAL) 5 MG tablet Take  5 mg by mouth daily as needed for allergies.    Yes Historical Provider, MD  nebivolol (BYSTOLIC) 5 MG tablet Take 5 mg by mouth every evening. For HTN   Yes Historical Provider, MD  rosuvastatin (CRESTOR) 10 MG tablet Take 10 mg by mouth every evening. For hyperlipidemia   Yes Historical Provider, MD  triamterene-hydrochlorothiazide (MAXZIDE-25) 37.5-25 MG per tablet Take 1 tablet by mouth daily. For HTN   Yes Historical Provider, MD  zolpidem (AMBIEN) 5 MG tablet Take 5 mg by mouth at bedtime as needed for sleep.   Yes Historical Provider, MD   BP 120/71  Pulse 87  Temp(Src) 98.5 F (36.9 C) (Oral)  Resp 17  SpO2 97% Physical Exam  Nursing note and vitals reviewed. Constitutional: She is oriented to person, place, and time. She appears well-developed and well-nourished.  HENT:  Head: Normocephalic and atraumatic.  Right Ear: External ear  normal.  Left Ear: External ear normal.  Tender right parietal scalp without lesion.  Eyes: Conjunctivae and EOM are normal. Pupils are equal, round, and reactive to light.  Neck: Normal range of motion and phonation normal. Neck supple.  Cardiovascular: Normal rate, regular rhythm, normal heart sounds and intact distal pulses.   Pulmonary/Chest: Effort normal and breath sounds normal. She exhibits no bony tenderness.  Abdominal: Soft. There is no tenderness.  Musculoskeletal: Normal range of motion.  Neurological: She is alert and oriented to person, place, and time. No cranial nerve deficit or sensory deficit. She exhibits normal muscle tone. Coordination normal.  No dysarthria, aphasia or nystagmus. Normal gait. Romberg is normal.  Skin: Skin is warm, dry and intact.  Psychiatric: She has a normal mood and affect. Her behavior is normal. Judgment and thought content normal.    ED Course  Procedures (including critical care time)    Labs Review Labs Reviewed - No data to display  Imaging Review No results found.   EKG Interpretation None      MDM   Final diagnoses:  Head injury, initial encounter    Subacute injury to head with episode of vomiting, unlikely to be related to a significant head injury. She's improved spontaneously. Patient understands that she can return here for further symptoms.  Nursing Notes Reviewed/ Care Coordinated Applicable Imaging Reviewed Interpretation of Laboratory Data incorporated into ED treatment  The patient appears reasonably screened and/or stabilized for discharge and I doubt any other medical condition or other [facility] requiring further screening, evaluation, or treatment in the ED at this time prior to discharge.  Plan: Home Medications- usual; Home Treatments- rest; return here if the recommended treatment, does not improve the symptoms; Recommended follow up- PCP prn    Richarda Blade, MD 06/06/14 [phone]

## 2014-11-08 ENCOUNTER — Other Ambulatory Visit: Payer: Self-pay

## 2014-11-08 DIAGNOSIS — Z1231 Encounter for screening mammogram for malignant neoplasm of breast: Secondary | ICD-10-CM

## 2014-11-23 DIAGNOSIS — I10 Essential (primary) hypertension: Secondary | ICD-10-CM | POA: Diagnosis not present

## 2014-11-23 DIAGNOSIS — Z79899 Other long term (current) drug therapy: Secondary | ICD-10-CM | POA: Diagnosis not present

## 2014-11-23 DIAGNOSIS — R7309 Other abnormal glucose: Secondary | ICD-10-CM | POA: Diagnosis not present

## 2014-12-01 ENCOUNTER — Ambulatory Visit

## 2014-12-03 ENCOUNTER — Ambulatory Visit

## 2014-12-03 ENCOUNTER — Ambulatory Visit
Admission: RE | Admit: 2014-12-03 | Discharge: 2014-12-03 | Disposition: A | Discharge status: Home / Self Care | Payer: Medicare Other | Source: Ambulatory Visit

## 2014-12-03 ENCOUNTER — Encounter (INDEPENDENT_AMBULATORY_CARE_PROVIDER_SITE_OTHER): Payer: Self-pay

## 2014-12-03 DIAGNOSIS — Z1231 Encounter for screening mammogram for malignant neoplasm of breast: Secondary | ICD-10-CM | POA: Diagnosis not present

## 2014-12-16 DIAGNOSIS — H25013 Cortical age-related cataract, bilateral: Secondary | ICD-10-CM | POA: Diagnosis not present

## 2015-02-10 DIAGNOSIS — E780 Pure hypercholesterolemia: Secondary | ICD-10-CM | POA: Diagnosis not present

## 2015-02-10 DIAGNOSIS — R011 Cardiac murmur, unspecified: Secondary | ICD-10-CM | POA: Diagnosis not present

## 2015-02-10 DIAGNOSIS — I10 Essential (primary) hypertension: Secondary | ICD-10-CM | POA: Diagnosis not present

## 2015-02-22 DIAGNOSIS — Z6832 Body mass index (BMI) 32.0-32.9, adult: Secondary | ICD-10-CM | POA: Diagnosis not present

## 2015-02-22 DIAGNOSIS — F17200 Nicotine dependence, unspecified, uncomplicated: Secondary | ICD-10-CM | POA: Diagnosis not present

## 2015-02-22 DIAGNOSIS — I272 Other secondary pulmonary hypertension: Secondary | ICD-10-CM | POA: Diagnosis not present

## 2015-02-22 DIAGNOSIS — I10 Essential (primary) hypertension: Secondary | ICD-10-CM | POA: Diagnosis not present

## 2015-02-22 DIAGNOSIS — M7061 Trochanteric bursitis, right hip: Secondary | ICD-10-CM | POA: Diagnosis not present

## 2015-03-03 ENCOUNTER — Emergency Department (HOSPITAL_COMMUNITY): Payer: Medicare Other

## 2015-03-03 ENCOUNTER — Emergency Department (HOSPITAL_COMMUNITY)
Admission: EM | Admit: 2015-03-03 | Discharge: 2015-03-03 | Disposition: A | Discharge status: Home / Self Care | Payer: Medicare Other | Attending: Emergency Medicine | Admitting: Emergency Medicine

## 2015-03-03 ENCOUNTER — Encounter (HOSPITAL_COMMUNITY): Payer: Self-pay

## 2015-03-03 DIAGNOSIS — Y998 Other external cause status: Secondary | ICD-10-CM | POA: Insufficient documentation

## 2015-03-03 DIAGNOSIS — Z8669 Personal history of other diseases of the nervous system and sense organs: Secondary | ICD-10-CM | POA: Insufficient documentation

## 2015-03-03 DIAGNOSIS — R011 Cardiac murmur, unspecified: Secondary | ICD-10-CM | POA: Diagnosis not present

## 2015-03-03 DIAGNOSIS — S39012A Strain of muscle, fascia and tendon of lower back, initial encounter: Secondary | ICD-10-CM | POA: Insufficient documentation

## 2015-03-03 DIAGNOSIS — M1990 Unspecified osteoarthritis, unspecified site: Secondary | ICD-10-CM | POA: Diagnosis not present

## 2015-03-03 DIAGNOSIS — S3992XA Unspecified injury of lower back, initial encounter: Secondary | ICD-10-CM | POA: Diagnosis not present

## 2015-03-03 DIAGNOSIS — F419 Anxiety disorder, unspecified: Secondary | ICD-10-CM | POA: Diagnosis not present

## 2015-03-03 DIAGNOSIS — Z7982 Long term (current) use of aspirin: Secondary | ICD-10-CM | POA: Diagnosis not present

## 2015-03-03 DIAGNOSIS — Y9289 Other specified places as the place of occurrence of the external cause: Secondary | ICD-10-CM | POA: Diagnosis not present

## 2015-03-03 DIAGNOSIS — Y9389 Activity, other specified: Secondary | ICD-10-CM | POA: Diagnosis not present

## 2015-03-03 DIAGNOSIS — M545 Low back pain: Secondary | ICD-10-CM | POA: Diagnosis not present

## 2015-03-03 DIAGNOSIS — Z79899 Other long term (current) drug therapy: Secondary | ICD-10-CM | POA: Diagnosis not present

## 2015-03-03 DIAGNOSIS — Z91040 Latex allergy status: Secondary | ICD-10-CM | POA: Diagnosis not present

## 2015-03-03 DIAGNOSIS — X58XXXA Exposure to other specified factors, initial encounter: Secondary | ICD-10-CM | POA: Insufficient documentation

## 2015-03-03 DIAGNOSIS — Z880 Allergy status to penicillin: Secondary | ICD-10-CM | POA: Diagnosis not present

## 2015-03-03 DIAGNOSIS — I10 Essential (primary) hypertension: Secondary | ICD-10-CM | POA: Insufficient documentation

## 2015-03-03 DIAGNOSIS — M5431 Sciatica, right side: Secondary | ICD-10-CM

## 2015-03-03 DIAGNOSIS — Z86718 Personal history of other venous thrombosis and embolism: Secondary | ICD-10-CM | POA: Diagnosis not present

## 2015-03-03 DIAGNOSIS — M549 Dorsalgia, unspecified: Secondary | ICD-10-CM

## 2015-03-03 DIAGNOSIS — M47816 Spondylosis without myelopathy or radiculopathy, lumbar region: Secondary | ICD-10-CM | POA: Diagnosis not present

## 2015-03-03 LAB — URINALYSIS, ROUTINE W REFLEX MICROSCOPIC
Bilirubin Urine: NEGATIVE
Glucose, UA: NEGATIVE mg/dL
Hgb urine dipstick: NEGATIVE
KETONES UR: NEGATIVE mg/dL
LEUKOCYTES UA: NEGATIVE
Nitrite: NEGATIVE
PROTEIN: NEGATIVE mg/dL
Specific Gravity, Urine: 1.018 (ref 1.005–1.030)
Urobilinogen, UA: 0.2 mg/dL (ref 0.0–1.0)
pH: 5.5 (ref 5.0–8.0)

## 2015-03-03 MED ORDER — OXYCODONE-ACETAMINOPHEN 5-325 MG PO TABS: 2.0000 | ORAL_TABLET | ORAL | Status: DC | PRN

## 2015-03-03 MED ORDER — DIAZEPAM 5 MG PO TABS: 5.0000 mg | ORAL_TABLET | Freq: Four times a day (QID) | ORAL | Status: DC | PRN

## 2015-03-03 MED ORDER — DIAZEPAM 5 MG PO TABS
5.0000 mg | ORAL_TABLET | Freq: Once | ORAL | Status: AC
  Administered 2015-03-03: 5 mg via ORAL
  Filled 2015-03-03: qty 1

## 2015-03-03 MED ORDER — HYDROMORPHONE HCL 1 MG/ML IJ SOLN
1.0000 mg | Freq: Once | INTRAMUSCULAR | Status: AC
  Administered 2015-03-03: 1 mg via INTRAMUSCULAR
  Filled 2015-03-03: qty 1

## 2015-03-03 NOTE — ED Provider Notes (Signed)
CSN: [id number]     Arrival date & time 03/03/15  7001 History   First MD Initiated Contact with Patient 03/03/15 0241     Chief Complaint  Patient presents with  . Back Pain     (Consider location/radiation/quality/duration/timing/severity/associated sxs/prior Treatment) Patient is a 64 y.o. female presenting with back pain.  Back Pain Location:  Lumbar spine and sacro-iliac joint Quality:  Aching and burning Radiates to: bil thigh, groin. Pain severity:  Moderate Pain is:  Same all the time Onset quality:  Sudden Duration:  2 hours Timing:  Constant Progression:  Unchanged Chronicity:  New Context: not falling, not recent illness, not recent injury and not twisting   Relieved by:  Nothing Worsened by:  Touching and twisting Associated symptoms: no abdominal pain, no bladder incontinence, no bowel incontinence, no dysuria, no fever, no numbness, no perianal numbness and no tingling     Past Medical History  Diagnosis Date  . Hypertension     takes Maxzide daily  . Heart murmur     takes Bystolic nightly  . Hyperlipidemia     takes Crestor nightly  . History of blood clots 22yrs    was on Lovenox injections and Coumadin(was only on that for short period of time)  . H/O migraine     last 40yrs ago  . Arthritis   . Joint pain   . Joint swelling   . Hx of seasonal allergies     takes Levocetirizine prn  . Diverticulosis   . H/O blood transfusion reaction 2010    hives  . Anxiety     takes Xanax prn  . Insomnia     takes Ambien prn  . History of shingles 3-4yrs ago  . VCBSWHQP(591.6)    Past Surgical History  Procedure Laterality Date  . Breast reduction surgery  1987  . Joint replacement      bilateral hip rt in 2007 and lt in 2010  . Cholecystectomy    . Abdominal hysterectomy    . Colonoscopy    . Esophagogastroduodenoscopy    . Total hip revision  04/14/2012    Procedure: TOTAL HIP REVISION;  Surgeon: Kerin Salen, MD;  Location: [facility];  Service:  Orthopedics;  Laterality: Right;  right total hip revision  . Eye surgery Right     eye lash removed  . Total hip revision Left 11/30/2013    Procedure: TOTAL HIP REVISION;  Surgeon: Kerin Salen, MD;  Location: [facility];  Service: Orthopedics;  Laterality: Left;   History reviewed. No pertinent family history. History  Substance Use Topics  . Smoking status: Passive Smoke Exposure - Never Smoker -- 42 years    Types: Cigarettes  . Smokeless tobacco: Never Used  . Alcohol Use: Yes     Comment: occasionally   OB History    No data available     Review of Systems  Constitutional: Negative for fever.  Gastrointestinal: Negative for abdominal pain and bowel incontinence.  Genitourinary: Negative for bladder incontinence and dysuria.  Musculoskeletal: Positive for back pain.  Neurological: Negative for tingling and numbness.  All other systems reviewed and are negative.     Allergies  Ibuprofen; Latex; and Penicillins  Home Medications   Prior to Admission medications   Medication Sig Start Date End Date Taking? Authorizing Provider  aspirin-sod bicarb-citric acid (ALKA-SELTZER) 325 MG TBEF tablet Take 325 mg by mouth every 6 (six) hours as needed (indigestion).   Yes Historical Provider, MD  Bepotastine  Besilate (BEPREVE) 1.5 % SOLN Place 1 drop into both eyes 2 (two) times daily.   Yes Historical Provider, MD  cholecalciferol (VITAMIN D) 1000 UNITS tablet Take 1,000 Units by mouth daily.   Yes Historical Provider, MD  levocetirizine (XYZAL) 5 MG tablet Take 5 mg by mouth daily as needed for allergies.    Yes Historical Provider, MD  methocarbamol (ROBAXIN) 500 MG tablet Take 1 tablet by mouth every 6 (six) hours as needed. pain 02/22/15  Yes Historical Provider, MD  nebivolol (BYSTOLIC) 5 MG tablet Take 5 mg by mouth every evening. For HTN   Yes Historical Provider, MD  rosuvastatin (CRESTOR) 10 MG tablet Take 10 mg by mouth every evening. For hyperlipidemia   Yes Historical  Provider, MD  triamterene-hydrochlorothiazide (MAXZIDE-25) 37.5-25 MG per tablet Take 1 tablet by mouth daily. For HTN   Yes Historical Provider, MD  diazepam (VALIUM) 5 MG tablet Take 1 tablet (5 mg total) by mouth every 6 (six) hours as needed (spasms). 03/03/15   Debby Freiberg, MD  oxyCODONE-acetaminophen (PERCOCET/ROXICET) 5-325 MG per tablet Take 2 tablets by mouth every 4 (four) hours as needed for severe pain. 03/03/15   Debby Freiberg, MD   BP 104/55 mmHg  Pulse 71  Temp(Src) 97.9 F (36.6 C) (Oral)  Resp 16  Ht 4\' 11"  (1.499 m)  Wt 162 lb (73.483 kg)  BMI 32.70 kg/m2  SpO2 93% Physical Exam  Constitutional: She is oriented to person, place, and time. She appears well-developed and well-nourished.  HENT:  Head: Normocephalic and atraumatic.  Right Ear: External ear normal.  Left Ear: External ear normal.  Eyes: Conjunctivae and EOM are normal. Pupils are equal, round, and reactive to light.  Neck: Normal range of motion. Neck supple.  Cardiovascular: Normal rate, regular rhythm, normal heart sounds and intact distal pulses.   Pulmonary/Chest: Effort normal and breath sounds normal.  Abdominal: Soft. Bowel sounds are normal. There is no tenderness.  Musculoskeletal: Normal range of motion.       Lumbar back: She exhibits tenderness and bony tenderness. She exhibits normal range of motion.  Straight leg positive bil  Neurological: She is alert and oriented to person, place, and time.  No neuro deficits of legs  Skin: Skin is warm and dry.  Vitals reviewed.   ED Course  Procedures (including critical care time) Labs Review Labs Reviewed  URINALYSIS, ROUTINE W REFLEX MICROSCOPIC    Imaging Review Dg Lumbar Spine Complete  03/03/2015   CLINICAL DATA:  Known injury. Sudden onset low back pain about 12 o'clock this morning. Pain radiates to both hips and legs.  EXAM: LUMBAR SPINE - COMPLETE 4+ VIEW  COMPARISON:  07/25/2009  FINDINGS: Degenerative changes throughout the  lumbar spine with narrowed lumbar interspaces and associated endplate hypertrophic changes. Mild scoliosis convex towards the left. No anterior subluxation. Posterior elements demonstrate normal alignment. Degenerative changes in the posterior facet joints. Bilateral hip arthroplasties incidentally noted. No vertebral compression deformities. No focal bone lesion or bone destruction. No significant change since prior study.  IMPRESSION: Degenerative changes in the lumbar spine. No acute bony abnormalities.   Electronically Signed   By: Lucienne Capers M.D.   On: 03/03/2015 03:56     EKG Interpretation None      MDM   Final diagnoses:  Back pain  Lumbar strain, initial encounter  Sciatica, right    64 y.o. female with pertinent PMH of arthritis presents with acute onset atraumatic back pain as above.  Pt was  getting up from the couch when pain began.  Physical exam today reassuring.  Historically and by exam concerning for paraspinal spasm, as pt has reproduction of pain with palpation of spine and paraspinal area of lumbar/sacral spine.    Wu unremarkable.  DC home in stable condition after pain medication  I have reviewed all laboratory and imaging studies if ordered as above  1. Sciatica, right   2. Back pain   3. Lumbar strain, initial encounter         Debby Freiberg, MD 03/04/15 [phone]

## 2015-03-03 NOTE — ED Notes (Signed)
Pt complains of lower back pain that radiates down her right leg, pt is unable to sleep because of the pain

## 2015-03-03 NOTE — ED Notes (Signed)
Pt ambulating independently w/ steady gait on d/c in no acute distress, A&Ox4. D/c instructions reviewed w/ pt and family - pt and family deny any further questions or concerns at present. Rx given x2  

## 2015-03-03 NOTE — ED Notes (Signed)
Pt admits to lower back discomfort that radiates down both legs, acute onset this evening. Pt denies any hx of back pain or injuries - states "it feels like my whole lower body is on fire." Pt denies any focal weaknesses as well.

## 2015-03-03 NOTE — Discharge Instructions (Signed)
Back Pain, Adult Low back pain is very common. About 1 in 5 people have back pain.The cause of low back pain is rarely dangerous. The pain often gets better over time.About half of people with a sudden onset of back pain feel better in just 2 weeks. About 8 in 10 people feel better by 6 weeks.  CAUSES Some common causes of back pain include:  Strain of the muscles or ligaments supporting the spine.  Wear and tear (degeneration) of the spinal discs.  Arthritis.  Direct injury to the back. DIAGNOSIS Most of the time, the direct cause of low back pain is not known.However, back pain can be treated effectively even when the exact cause of the pain is unknown.Answering your caregiver's questions about your overall health and symptoms is one of the most accurate ways to make sure the cause of your pain is not dangerous. If your caregiver needs more information, he or she may order lab work or imaging tests (X-rays or MRIs).However, even if imaging tests show changes in your back, this usually does not require surgery. HOME CARE INSTRUCTIONS For many people, back pain returns.Since low back pain is rarely dangerous, it is often a condition that people can learn to manageon their own.   Remain active. It is stressful on the back to sit or stand in one place. Do not sit, drive, or stand in one place for more than 30 minutes at a time. Take short walks on level surfaces as soon as pain allows.Try to increase the length of time you walk each day.  Do not stay in bed.Resting more than 1 or 2 days can delay your recovery.  Do not avoid exercise or work.Your body is made to move.It is not dangerous to be active, even though your back may hurt.Your back will likely heal faster if you return to being active before your pain is gone.  Pay attention to your body when you bend and lift. Many people have less discomfortwhen lifting if they bend their knees, keep the load close to their bodies,and  avoid twisting. Often, the most comfortable positions are those that put less stress on your recovering back.  Find a comfortable position to sleep. Use a firm mattress and lie on your side with your knees slightly bent. If you lie on your back, put a pillow under your knees.  Only take over-the-counter or prescription medicines as directed by your caregiver. Over-the-counter medicines to reduce pain and inflammation are often the most helpful.Your caregiver may prescribe muscle relaxant drugs.These medicines help dull your pain so you can more quickly return to your normal activities and healthy exercise.  Put ice on the injured area.  Put ice in a plastic bag.  Place a towel between your skin and the bag.  Leave the ice on for 15-20 minutes, 03-04 times a day for the first 2 to 3 days. After that, ice and heat may be alternated to reduce pain and spasms.  Ask your caregiver about trying back exercises and gentle massage. This may be of some benefit.  Avoid feeling anxious or stressed.Stress increases muscle tension and can worsen back pain.It is important to recognize when you are anxious or stressed and learn ways to manage it.Exercise is a great option. SEEK MEDICAL CARE IF:  You have pain that is not relieved with rest or medicine.  You have pain that does not improve in 1 week.  You have new symptoms.  You are generally not feeling well. SEEK   IMMEDIATE MEDICAL CARE IF:   You have pain that radiates from your back into your legs.  You develop new bowel or bladder control problems.  You have unusual weakness or numbness in your arms or legs.  You develop nausea or vomiting.  You develop abdominal pain.  You feel faint. Document Released: 10/08/2005 Document Revised: 04/08/2012 Document Reviewed: 02/09/2014 ExitCare Patient Information 2015 [employer]. This information is not intended to replace advice given to you by your health care provider. Make sure you  discuss any questions you have with your health care provider.  

## 2015-03-10 DIAGNOSIS — M5441 Lumbago with sciatica, right side: Secondary | ICD-10-CM | POA: Diagnosis not present

## 2015-03-25 DIAGNOSIS — M5136 Other intervertebral disc degeneration, lumbar region: Secondary | ICD-10-CM | POA: Diagnosis not present

## 2015-03-25 DIAGNOSIS — M5417 Radiculopathy, lumbosacral region: Secondary | ICD-10-CM | POA: Diagnosis not present

## 2015-03-25 DIAGNOSIS — M9902 Segmental and somatic dysfunction of thoracic region: Secondary | ICD-10-CM | POA: Diagnosis not present

## 2015-03-25 DIAGNOSIS — M9904 Segmental and somatic dysfunction of sacral region: Secondary | ICD-10-CM | POA: Diagnosis not present

## 2015-03-25 DIAGNOSIS — S29012A Strain of muscle and tendon of back wall of thorax, initial encounter: Secondary | ICD-10-CM | POA: Diagnosis not present

## 2015-03-25 DIAGNOSIS — M9903 Segmental and somatic dysfunction of lumbar region: Secondary | ICD-10-CM | POA: Diagnosis not present

## 2015-03-29 DIAGNOSIS — M5417 Radiculopathy, lumbosacral region: Secondary | ICD-10-CM | POA: Diagnosis not present

## 2015-03-29 DIAGNOSIS — M9902 Segmental and somatic dysfunction of thoracic region: Secondary | ICD-10-CM | POA: Diagnosis not present

## 2015-03-29 DIAGNOSIS — M5136 Other intervertebral disc degeneration, lumbar region: Secondary | ICD-10-CM | POA: Diagnosis not present

## 2015-03-29 DIAGNOSIS — M9903 Segmental and somatic dysfunction of lumbar region: Secondary | ICD-10-CM | POA: Diagnosis not present

## 2015-03-29 DIAGNOSIS — S29012A Strain of muscle and tendon of back wall of thorax, initial encounter: Secondary | ICD-10-CM | POA: Diagnosis not present

## 2015-03-29 DIAGNOSIS — M9904 Segmental and somatic dysfunction of sacral region: Secondary | ICD-10-CM | POA: Diagnosis not present

## 2015-03-31 DIAGNOSIS — S29012A Strain of muscle and tendon of back wall of thorax, initial encounter: Secondary | ICD-10-CM | POA: Diagnosis not present

## 2015-03-31 DIAGNOSIS — M5417 Radiculopathy, lumbosacral region: Secondary | ICD-10-CM | POA: Diagnosis not present

## 2015-03-31 DIAGNOSIS — M9903 Segmental and somatic dysfunction of lumbar region: Secondary | ICD-10-CM | POA: Diagnosis not present

## 2015-03-31 DIAGNOSIS — M5136 Other intervertebral disc degeneration, lumbar region: Secondary | ICD-10-CM | POA: Diagnosis not present

## 2015-03-31 DIAGNOSIS — M9904 Segmental and somatic dysfunction of sacral region: Secondary | ICD-10-CM | POA: Diagnosis not present

## 2015-03-31 DIAGNOSIS — M9902 Segmental and somatic dysfunction of thoracic region: Secondary | ICD-10-CM | POA: Diagnosis not present

## 2015-04-02 DIAGNOSIS — M9903 Segmental and somatic dysfunction of lumbar region: Secondary | ICD-10-CM | POA: Diagnosis not present

## 2015-04-02 DIAGNOSIS — M9902 Segmental and somatic dysfunction of thoracic region: Secondary | ICD-10-CM | POA: Diagnosis not present

## 2015-04-02 DIAGNOSIS — M5417 Radiculopathy, lumbosacral region: Secondary | ICD-10-CM | POA: Diagnosis not present

## 2015-04-02 DIAGNOSIS — M9904 Segmental and somatic dysfunction of sacral region: Secondary | ICD-10-CM | POA: Diagnosis not present

## 2015-04-02 DIAGNOSIS — M5136 Other intervertebral disc degeneration, lumbar region: Secondary | ICD-10-CM | POA: Diagnosis not present

## 2015-04-02 DIAGNOSIS — M545 Low back pain: Secondary | ICD-10-CM | POA: Diagnosis not present

## 2015-04-02 DIAGNOSIS — S29012A Strain of muscle and tendon of back wall of thorax, initial encounter: Secondary | ICD-10-CM | POA: Diagnosis not present

## 2015-04-04 DIAGNOSIS — M5136 Other intervertebral disc degeneration, lumbar region: Secondary | ICD-10-CM | POA: Diagnosis not present

## 2015-04-04 DIAGNOSIS — M9902 Segmental and somatic dysfunction of thoracic region: Secondary | ICD-10-CM | POA: Diagnosis not present

## 2015-04-04 DIAGNOSIS — S29012A Strain of muscle and tendon of back wall of thorax, initial encounter: Secondary | ICD-10-CM | POA: Diagnosis not present

## 2015-04-04 DIAGNOSIS — M9903 Segmental and somatic dysfunction of lumbar region: Secondary | ICD-10-CM | POA: Diagnosis not present

## 2015-04-04 DIAGNOSIS — M5417 Radiculopathy, lumbosacral region: Secondary | ICD-10-CM | POA: Diagnosis not present

## 2015-04-04 DIAGNOSIS — M9904 Segmental and somatic dysfunction of sacral region: Secondary | ICD-10-CM | POA: Diagnosis not present

## 2015-04-06 DIAGNOSIS — M9903 Segmental and somatic dysfunction of lumbar region: Secondary | ICD-10-CM | POA: Diagnosis not present

## 2015-04-06 DIAGNOSIS — M9904 Segmental and somatic dysfunction of sacral region: Secondary | ICD-10-CM | POA: Diagnosis not present

## 2015-04-06 DIAGNOSIS — S29012A Strain of muscle and tendon of back wall of thorax, initial encounter: Secondary | ICD-10-CM | POA: Diagnosis not present

## 2015-04-06 DIAGNOSIS — M5136 Other intervertebral disc degeneration, lumbar region: Secondary | ICD-10-CM | POA: Diagnosis not present

## 2015-04-06 DIAGNOSIS — M9902 Segmental and somatic dysfunction of thoracic region: Secondary | ICD-10-CM | POA: Diagnosis not present

## 2015-04-06 DIAGNOSIS — M5417 Radiculopathy, lumbosacral region: Secondary | ICD-10-CM | POA: Diagnosis not present

## 2015-04-08 DIAGNOSIS — M9904 Segmental and somatic dysfunction of sacral region: Secondary | ICD-10-CM | POA: Diagnosis not present

## 2015-04-08 DIAGNOSIS — M5417 Radiculopathy, lumbosacral region: Secondary | ICD-10-CM | POA: Diagnosis not present

## 2015-04-08 DIAGNOSIS — S29012A Strain of muscle and tendon of back wall of thorax, initial encounter: Secondary | ICD-10-CM | POA: Diagnosis not present

## 2015-04-08 DIAGNOSIS — M5416 Radiculopathy, lumbar region: Secondary | ICD-10-CM | POA: Diagnosis not present

## 2015-04-08 DIAGNOSIS — M5136 Other intervertebral disc degeneration, lumbar region: Secondary | ICD-10-CM | POA: Diagnosis not present

## 2015-04-08 DIAGNOSIS — M9902 Segmental and somatic dysfunction of thoracic region: Secondary | ICD-10-CM | POA: Diagnosis not present

## 2015-04-08 DIAGNOSIS — M9903 Segmental and somatic dysfunction of lumbar region: Secondary | ICD-10-CM | POA: Diagnosis not present

## 2015-04-11 NOTE — H&P (Signed)
OFFICE VISIT NOTES COPIED TO EPIC FOR DOCUMENTATION   [name] 03-22-15 8:28 AM Location: [facility] Patient #: [id number] DOB: May 04, 1951 Married / Language: English / Race: Black or African American Female    History of Present Illness(Veronica Joannie Springs, MD; 03-22-15 5:37 PM) Patient words: 6 mo f/u; Pt states she has had no new symptoms since last o/v.  The patient is a 64 year old female who presents for a follow-up for pulmonary hypertension. [name] is 64 years old African-American female. She has been feeling fair. No complaints of chest pain, tightness or pressure. No shortness of breath, orthopnea or PND. She has felt that her heart beat fast on exertion. There is no associated dizziness. No history of near-syncope or syncope at any time. No complaints of swelling on the legs and no leg claudication.  No history of rheumatic fever. Patient has hypertension, prediabetes (Hemoglobin A1c was 6.0 in August 2015), hypercholesterolemia. She has cut down smoking, patient claims that she smoked only one cigarette in whole month. She does water aerobics 4 times a week.  No history of thyroid problems. No history of TIA or CVA.     Problem List/Past Medical(Christie Beane; 03/22/15 2:06 PM) Hypercholesteremia (E78.0) Essential hypertension, benign (I10) Heart murmur (R01.1) Smoking (F17.200) Insomnia (G47.00) Pre Diabetes (Hemoglobin A1c was 6.0 in August 2015).    Allergies(Christie Beane; 03-22-15 2:06 PM) Penicillins. Hives. Ibuprofen *ANALGESICS - ANTI-INFLAMMATORY*. Lips, eyes swell    Family History(Christie Beane; 22-Mar-2015 2:06 PM) Father. Deceased. Pt knows no information about Father. Siblings. 32 Sisters Mother. Deceased. at age 41, from [place]. Known MI    Social History(Veronica Joannie Springs, MD; 2015-03-22 5:43 PM) Marital status. Married. Number of Children. 2. Living Situation. Lives with  spouse. Alcohol Use. Occasional alcohol use. Current tobacco use. Current some day smoker.    Past Surgical History(Christie Beane; 2015-03-22 2:06 PM) Hysterectomy; Abdominal. 1979 Breast Reduction - Both. 1987 Total Hip Replacement - Right. 2007 2012 Total Hip Replacement - Left. 2010 2015 Cholecystectomy. 2011    Medication History(Christie Beane; 2015-03-22 2:14 PM) Levocetirizine Dihydrochloride (5MG  Tablet, 1 Oral daily, Stopped taking 01/2015) Discontinued. (Pt states she does not need.) Crestor (10MG  Tablet, 1 Oral daily) Active. Zolpidem Tartrate (5MG  Tablet, 1 Oral at bedtime, Stopped taking 01/2015) Discontinued. (Pt states she doesnt take due to age.) Triamterene-HCTZ (37.5-25MG  Capsule, 1 Oral daily) Active. Bystolic (5MG  Tablet, 1 Oral daily) Active. Vitamin D3 (5000UNIT Tablet, 1 Oral daily) Active. Medications Reconciled.    Diagnostic Studies History(Christie Beane; 2015-03-22 2:06 PM) Echocardiogram. 02/10/2015 Left ventricle cavity is normal in size. Normal global wall motion. Doppler evidence of grade I (impaired) diastolic dysfunction. Calculated EF 59%. Left atrial cavity is mildly dilated. Right atrial cavity is mildly dilated. Right ventricle cavity is mildly dilated. Normal right ventricular function. Mild to moderate tricuspid regurgitation. Mild pulmonary hypertension. Compared to the study done on 09/01/2014, no significant change noted. Treadmill stress test. 2011 Sleep Study. 2012 Pt does not have Sleep Apnea Coronary Angiogram. 2012 No stents placed Endoscopy. 2013 Colonoscopy. 2014 Normal- removed benign polyps    Other Problems(Christie Beane; 03-22-15 2:06 PM) pre Diabetes (Hemoglobin A1c was 6.0 in August 2015).    Review of Systems(Veronica Joannie Springs, MD; 2015/03/22 5:39 PM)   Note: GENERAL- Not feeling tired or fatigue, No fever, chills. No recent weight change. CARDIO VASCULAR- No chest pain, shortness of breath, orthopnea  or PND. Has palpitation, No dizziness, fainting. Has hypertension and h/o high cholesterol. No swelling on legs. No  claudication in legs, No cramps. No h/o DVT PULMONARY- No cough, phlegm, wheezing, not feeling congested in chest. GASTROINTESTINAL- No abdominal pain, nausea, vomiting or diarrhea. No dark tarry stools.Normal appetite. Has heartburn. ENDOCRINE- No Thyroid problem, No feeling of excessive heat or cold, No polydipsia or polyuria. Has Pre Diabetes. NEUROLOGICAL- No focal motor or sensory symptoms, Good coordination. No seizures. MUSCULOSKELETAL- No generalized myalgias or muscle weakness. No joint swelling SKIN- No skin rash, No pruritus HEMATOLOGY- No anemia, petechiae, excessive bruising, epistaxis, GI bleed or any abnormal bleeding.    Vitals(Christie Beane; 02/22/2015 2:16 PM) 02/22/2015 2:06 PM Weight: 162.38 lb Height: 59 in Body Surface Area: 1.75 m Body Mass Index: 32.8 kg/m Pulse: 103 (Regular) P.OX: 95% (Room air) BP: 108/72 (Sitting, Left Arm, Standard)     Physical Exam(Veronica Joannie Springs, MD; 02/22/2015 5:40 PM) The physical exam findings are as follows:  Note: GENERAL APPEARANCE- Alert, Oriented. Well built, slightly obese. HEENT- Unremarkable, fundi were not examined. Pt. wears glasses. NECK- No JVD. Carotid pulses are 2+, No bruits audible. No thyromegaly. No lymphadenopathy. HEART- Keloid scar visible slightly to the left of sternum from previous breast reduction surgery. Auscultation- Normal S1, S2. No gallops. Grade 2/6 ejection systolic murmur is audible in the aortic, pulmonic and upper left parasternal area. No significant change with Valsalva maneuver. CHEST- Normal shape. Normal percussion. Auscultation- Normal breath sounds, No crepitations. No wheezing. ABDOMEN- Palpation- Soft, Nontender. No hepatosplenomegaly. No masses felt. Auscultation- No bruits audible. EXTREMITIES- No Clubbing or Cyanosis. No edema on legs or  feet. PERIPHERAL PULSES- Both femoral pulses- 2+, No bruits audible. Peripheral pulses in both feet are 1-2+.    Assessment & Plan(Veronica Joannie Springs, MD; 02/22/2015 5:36 PM) Pulmonary hypertension, mild (I27.2) Story: Echocardiogram. 02/10/2015 Left ventricle cavity is normal in size. Normal global wall motion. Doppler evidence of grade I (impaired) diastolic dysfunction. Calculated EF 59%. Left atrial cavity is mildly dilated. Right atrial cavity is mildly dilated. Right ventricle cavity is mildly dilated. Normal right ventricular function. Mild to moderate tricuspid regurgitation. Mild pulmonary hypertension. Compared to the study done on 09/01/2014, no significant change noted. PASP was 35 mm of Hg. Current Plans l Complete electrocardiogram (93000) l Pt Education - How to access health information online: discussed with patient and provided information. Future Plans l 02/23/2016: Echocardiography, transthoracic, real-time with image documentation (2D), includes M-mode recording, when performed, complete, with spectral Doppler echocardiography, and with color flow Doppler echocardiography (96045) - one time  Essential hypertension, benign (I10)  Smoking (F17.200) Current Plans l Patient screened for tobacco use and received tobacco cessation intervention (counseling, pharmacotherapy, or both), if identified as a tobacco user (PV, CAD) (4004F)  BMI 32.0-32.9,adult (Z68.32)  Note: EKG revealed sinus rhythm, nonspecific ST-T abnormality. No diagnostic change from previous EKG of 08/24/2014.  Echocardiogram findings were also explained to the patient. She has mild pulmonary hypertension with PA systolic pressure of 35 mmHg. There was no significant change in the echo findings from previous study of 09/01/2014.  Patient is clinically stable. Etiology of mild pulmonary hypertension is unclear, it may be due to COPD, patient has history of smoking for a long time. I  have again advised her very strongly to quit smoking completely.  Primary prevention was again discussed with the patient. She was advised to follow ADA, low-salt, low-cholesterol diet. Patient was advised calorie restriction to lose weight. Patient was encouraged to continue regular exercise.  She will return for regular follow-up after 1 year but call us earlier if there are  any cardiac problems. She will have a repeat echocardiogram before the next visit in one year.  Cc Dr. Glendale Chard.    Addendum Note(Veronica Joannie Springs, MD; 02/23/2015 2:03 PM) 02/23/15- patient has mild pulmonary hypertension and mildly dilated right heart. As such, I have recommended TEE with bubble contrast study for further evaluation and for any possible shunt. Indications, procedure and possible complications were explained to her. She verbalized understanding and agrees for the test. TEE will be scheduled to be done by Dr. Einar Gip.   Signed electronically by Despina Hick, MD (02/22/2015 5:43 PM)

## 2015-04-12 ENCOUNTER — Ambulatory Visit (HOSPITAL_COMMUNITY)
Admission: RE | Admit: 2015-04-12 | Discharge: 2015-04-12 | Disposition: A | Discharge status: Home / Self Care | Payer: Medicare Other | Source: Ambulatory Visit | Attending: Cardiology | Admitting: Cardiology

## 2015-04-12 ENCOUNTER — Encounter (HOSPITAL_COMMUNITY): Payer: Self-pay | Admitting: *Deleted

## 2015-04-12 ENCOUNTER — Encounter (HOSPITAL_COMMUNITY)
Admission: RE | Disposition: A | Discharge status: Home / Self Care | Payer: Self-pay | Source: Ambulatory Visit | Attending: Cardiology

## 2015-04-12 DIAGNOSIS — I10 Essential (primary) hypertension: Secondary | ICD-10-CM | POA: Diagnosis not present

## 2015-04-12 DIAGNOSIS — Z9049 Acquired absence of other specified parts of digestive tract: Secondary | ICD-10-CM | POA: Insufficient documentation

## 2015-04-12 DIAGNOSIS — Z886 Allergy status to analgesic agent status: Secondary | ICD-10-CM | POA: Diagnosis not present

## 2015-04-12 DIAGNOSIS — Z96643 Presence of artificial hip joint, bilateral: Secondary | ICD-10-CM | POA: Insufficient documentation

## 2015-04-12 DIAGNOSIS — M9904 Segmental and somatic dysfunction of sacral region: Secondary | ICD-10-CM | POA: Diagnosis not present

## 2015-04-12 DIAGNOSIS — I272 Other secondary pulmonary hypertension: Secondary | ICD-10-CM | POA: Insufficient documentation

## 2015-04-12 DIAGNOSIS — M9903 Segmental and somatic dysfunction of lumbar region: Secondary | ICD-10-CM | POA: Diagnosis not present

## 2015-04-12 DIAGNOSIS — E780 Pure hypercholesterolemia: Secondary | ICD-10-CM | POA: Diagnosis not present

## 2015-04-12 DIAGNOSIS — Z880 Allergy status to penicillin: Secondary | ICD-10-CM | POA: Diagnosis not present

## 2015-04-12 DIAGNOSIS — R7309 Other abnormal glucose: Secondary | ICD-10-CM | POA: Diagnosis not present

## 2015-04-12 DIAGNOSIS — M9902 Segmental and somatic dysfunction of thoracic region: Secondary | ICD-10-CM | POA: Diagnosis not present

## 2015-04-12 DIAGNOSIS — R0600 Dyspnea, unspecified: Secondary | ICD-10-CM | POA: Diagnosis not present

## 2015-04-12 DIAGNOSIS — F17200 Nicotine dependence, unspecified, uncomplicated: Secondary | ICD-10-CM | POA: Diagnosis not present

## 2015-04-12 DIAGNOSIS — M5417 Radiculopathy, lumbosacral region: Secondary | ICD-10-CM | POA: Diagnosis not present

## 2015-04-12 DIAGNOSIS — Z91040 Latex allergy status: Secondary | ICD-10-CM | POA: Diagnosis not present

## 2015-04-12 DIAGNOSIS — S29012A Strain of muscle and tendon of back wall of thorax, initial encounter: Secondary | ICD-10-CM | POA: Diagnosis not present

## 2015-04-12 DIAGNOSIS — Z90710 Acquired absence of both cervix and uterus: Secondary | ICD-10-CM | POA: Insufficient documentation

## 2015-04-12 DIAGNOSIS — M5136 Other intervertebral disc degeneration, lumbar region: Secondary | ICD-10-CM | POA: Diagnosis not present

## 2015-04-12 HISTORY — PX: TEE WITHOUT CARDIOVERSION: SHX5443

## 2015-04-12 SURGERY — ECHOCARDIOGRAM, TRANSESOPHAGEAL
Anesthesia: Moderate Sedation

## 2015-04-12 MED ORDER — MIDAZOLAM HCL 10 MG/2ML IJ SOLN
INTRAMUSCULAR | Status: DC | PRN
  Administered 2015-04-12 (×2): 2 mg via INTRAVENOUS

## 2015-04-12 MED ORDER — BUTAMBEN-TETRACAINE-BENZOCAINE 2-2-14 % EX AERO
INHALATION_SPRAY | CUTANEOUS | Status: DC | PRN
  Administered 2015-04-12: 2 via TOPICAL

## 2015-04-12 MED ORDER — SODIUM CHLORIDE 0.9 % IV SOLN
INTRAVENOUS | Status: DC
  Administered 2015-04-12: 500 mL via INTRAVENOUS

## 2015-04-12 MED ORDER — FENTANYL CITRATE (PF) 100 MCG/2ML IJ SOLN
INTRAMUSCULAR | Status: AC
  Filled 2015-04-12: qty 2

## 2015-04-12 MED ORDER — FENTANYL CITRATE (PF) 100 MCG/2ML IJ SOLN
INTRAMUSCULAR | Status: DC | PRN
  Administered 2015-04-12 (×2): 25 ug via INTRAVENOUS

## 2015-04-12 MED ORDER — MIDAZOLAM HCL 5 MG/ML IJ SOLN
INTRAMUSCULAR | Status: AC
  Filled 2015-04-12: qty 3

## 2015-04-12 MED ORDER — DIPHENHYDRAMINE HCL 50 MG/ML IJ SOLN
INTRAMUSCULAR | Status: AC
  Filled 2015-04-12: qty 1

## 2015-04-12 NOTE — Progress Notes (Signed)
Echocardiogram Echocardiogram Transesophageal has been performed.  Joelene Millin 04/12/2015, 2:11 PM

## 2015-04-12 NOTE — Interval H&P Note (Signed)
History and Physical Interval Note:  04/12/2015 1:33 PM  [name]  has presented today for surgery, with the diagnosis of pulmonary hypertension  The various methods of treatment have been discussed with the patient and family. After consideration of risks, benefits and other options for treatment, the patient has consented to  Procedure(s): TRANSESOPHAGEAL ECHOCARDIOGRAM (TEE)/BUBBLE STUDY (N/A) as a surgical intervention .  The patient's history has been reviewed, patient examined, no change in status, stable for surgery.  I have reviewed the patient's chart and labs.  Questions were answered to the patient's satisfaction.   Procedure being observed by Ms. Elita Boone PA student and Dr.  Phill Myron ([place] resident) and patient consents to this.   Adrian Prows

## 2015-04-12 NOTE — CV Procedure (Signed)
TEE: Under moderate sedation, TEE was performed without complications: LV: Normal. Normal EF. RV: Normal LA: Normal. Left atrial appendage: Normal without thrombus. Normal function. Inter atrial septum is intact without defect. Double contrast study negative for atrial level shunting. No late appearance of bubbles either. RA: Normal  MV: Normal Trace MR. TV: Normal Trace TR AV: Normal. No AI or AS. PV: Normal. Trace PI.  Thoracic and ascending aorta: Normal without significant plaque or atheromatous changes.

## 2015-04-12 NOTE — Discharge Instructions (Signed)
Transesophageal Echocardiogram °Transesophageal echocardiography (TEE) is a picture test of your heart using sound waves. The pictures taken can give very detailed pictures of your heart. This can help your doctor see if there are problems with your heart. TEE can check: °· If your heart has blood clots in it. °· How well your heart valves are working. °· If you have an infection on the inside of your heart. °· Some of the major arteries of your heart. °· If your heart valve is working after a repair. °· Your heart before a procedure that uses a shock to your heart to get the rhythm back to normal. °BEFORE THE PROCEDURE °· Do not eat or drink for 6 hours before the procedure or as told by your doctor. °· Make plans to have someone drive you home after the procedure. Do not drive yourself home. °· An IV tube will be put in your arm. °PROCEDURE °· You will be given a medicine to help you relax (sedative). It will be given through the IV tube. °· A numbing medicine will be sprayed or gargled in the back of your throat to help numb it. °· The tip of the probe is placed into the back of your mouth. You will be asked to swallow. This helps to pass the probe into your esophagus. °· Once the tip of the probe is in the right place, your doctor can take pictures of your heart. °· You may feel pressure at the back of your throat. °AFTER THE PROCEDURE °· You will be taken to a recovery area so the sedative can wear off. °· Your throat may be sore and scratchy. This will go away slowly over time. °· You will go home when you are fully awake and able to swallow liquids. °· You should have someone stay with you for the next 24 hours. °· Do not drive or operate machinery for the next 24 hours. °Document Released: 08/05/2009 Document Revised: 10/13/2013 Document Reviewed: 04/09/2013 °ExitCare® Patient Information ©2015 [employer]. This information is not intended to replace advice given to you by your health care provider. Make  sure you discuss any questions you have with your health care provider. ° °

## 2015-04-13 ENCOUNTER — Encounter (HOSPITAL_COMMUNITY): Payer: Self-pay | Admitting: Cardiology

## 2015-04-15 DIAGNOSIS — M9903 Segmental and somatic dysfunction of lumbar region: Secondary | ICD-10-CM | POA: Diagnosis not present

## 2015-04-15 DIAGNOSIS — M9902 Segmental and somatic dysfunction of thoracic region: Secondary | ICD-10-CM | POA: Diagnosis not present

## 2015-04-15 DIAGNOSIS — M5417 Radiculopathy, lumbosacral region: Secondary | ICD-10-CM | POA: Diagnosis not present

## 2015-04-15 DIAGNOSIS — M9904 Segmental and somatic dysfunction of sacral region: Secondary | ICD-10-CM | POA: Diagnosis not present

## 2015-04-15 DIAGNOSIS — S29012A Strain of muscle and tendon of back wall of thorax, initial encounter: Secondary | ICD-10-CM | POA: Diagnosis not present

## 2015-04-15 DIAGNOSIS — M5136 Other intervertebral disc degeneration, lumbar region: Secondary | ICD-10-CM | POA: Diagnosis not present

## 2015-04-18 DIAGNOSIS — M9904 Segmental and somatic dysfunction of sacral region: Secondary | ICD-10-CM | POA: Diagnosis not present

## 2015-04-18 DIAGNOSIS — M5136 Other intervertebral disc degeneration, lumbar region: Secondary | ICD-10-CM | POA: Diagnosis not present

## 2015-04-18 DIAGNOSIS — M5417 Radiculopathy, lumbosacral region: Secondary | ICD-10-CM | POA: Diagnosis not present

## 2015-04-18 DIAGNOSIS — S29012A Strain of muscle and tendon of back wall of thorax, initial encounter: Secondary | ICD-10-CM | POA: Diagnosis not present

## 2015-04-18 DIAGNOSIS — M9903 Segmental and somatic dysfunction of lumbar region: Secondary | ICD-10-CM | POA: Diagnosis not present

## 2015-04-18 DIAGNOSIS — M9902 Segmental and somatic dysfunction of thoracic region: Secondary | ICD-10-CM | POA: Diagnosis not present

## 2015-04-21 DIAGNOSIS — H2513 Age-related nuclear cataract, bilateral: Secondary | ICD-10-CM | POA: Diagnosis not present

## 2015-04-21 DIAGNOSIS — H40013 Open angle with borderline findings, low risk, bilateral: Secondary | ICD-10-CM | POA: Diagnosis not present

## 2015-04-27 DIAGNOSIS — M545 Low back pain: Secondary | ICD-10-CM | POA: Diagnosis not present

## 2015-04-27 DIAGNOSIS — M5416 Radiculopathy, lumbar region: Secondary | ICD-10-CM | POA: Diagnosis not present

## 2015-05-02 DIAGNOSIS — M5416 Radiculopathy, lumbar region: Secondary | ICD-10-CM | POA: Diagnosis not present

## 2015-05-02 DIAGNOSIS — M545 Low back pain: Secondary | ICD-10-CM | POA: Diagnosis not present

## 2015-05-05 DIAGNOSIS — M5416 Radiculopathy, lumbar region: Secondary | ICD-10-CM | POA: Diagnosis not present

## 2015-05-05 DIAGNOSIS — M545 Low back pain: Secondary | ICD-10-CM | POA: Diagnosis not present

## 2015-05-09 DIAGNOSIS — M545 Low back pain: Secondary | ICD-10-CM | POA: Diagnosis not present

## 2015-05-09 DIAGNOSIS — M5416 Radiculopathy, lumbar region: Secondary | ICD-10-CM | POA: Diagnosis not present

## 2015-05-12 DIAGNOSIS — M5416 Radiculopathy, lumbar region: Secondary | ICD-10-CM | POA: Diagnosis not present

## 2015-05-12 DIAGNOSIS — M545 Low back pain: Secondary | ICD-10-CM | POA: Diagnosis not present

## 2015-05-16 DIAGNOSIS — M5416 Radiculopathy, lumbar region: Secondary | ICD-10-CM | POA: Diagnosis not present

## 2015-05-16 DIAGNOSIS — M545 Low back pain: Secondary | ICD-10-CM | POA: Diagnosis not present

## 2015-05-19 ENCOUNTER — Encounter: Payer: Self-pay | Admitting: *Deleted

## 2015-05-19 DIAGNOSIS — M545 Low back pain: Secondary | ICD-10-CM | POA: Diagnosis not present

## 2015-05-19 DIAGNOSIS — M5416 Radiculopathy, lumbar region: Secondary | ICD-10-CM | POA: Diagnosis not present

## 2015-05-23 DIAGNOSIS — M545 Low back pain: Secondary | ICD-10-CM | POA: Diagnosis not present

## 2015-05-23 DIAGNOSIS — M5416 Radiculopathy, lumbar region: Secondary | ICD-10-CM | POA: Diagnosis not present

## 2015-05-26 DIAGNOSIS — M545 Low back pain: Secondary | ICD-10-CM | POA: Diagnosis not present

## 2015-05-26 DIAGNOSIS — M5416 Radiculopathy, lumbar region: Secondary | ICD-10-CM | POA: Diagnosis not present

## 2015-05-31 DIAGNOSIS — Z79899 Other long term (current) drug therapy: Secondary | ICD-10-CM | POA: Diagnosis not present

## 2015-05-31 DIAGNOSIS — L309 Dermatitis, unspecified: Secondary | ICD-10-CM | POA: Diagnosis not present

## 2015-05-31 DIAGNOSIS — R252 Cramp and spasm: Secondary | ICD-10-CM | POA: Diagnosis not present

## 2015-05-31 DIAGNOSIS — K120 Recurrent oral aphthae: Secondary | ICD-10-CM | POA: Diagnosis not present

## 2015-06-08 DIAGNOSIS — M5416 Radiculopathy, lumbar region: Secondary | ICD-10-CM | POA: Diagnosis not present

## 2015-06-08 DIAGNOSIS — M545 Low back pain: Secondary | ICD-10-CM | POA: Diagnosis not present

## 2015-06-10 ENCOUNTER — Encounter: Payer: Self-pay | Admitting: Cardiovascular Disease

## 2015-06-23 DIAGNOSIS — M5416 Radiculopathy, lumbar region: Secondary | ICD-10-CM | POA: Diagnosis not present

## 2015-06-23 DIAGNOSIS — M545 Low back pain: Secondary | ICD-10-CM | POA: Diagnosis not present

## 2015-07-05 DIAGNOSIS — R7309 Other abnormal glucose: Secondary | ICD-10-CM | POA: Diagnosis not present

## 2015-07-05 DIAGNOSIS — Z0000 Encounter for general adult medical examination without abnormal findings: Secondary | ICD-10-CM | POA: Diagnosis not present

## 2015-07-05 DIAGNOSIS — E780 Pure hypercholesterolemia: Secondary | ICD-10-CM | POA: Diagnosis not present

## 2015-07-05 DIAGNOSIS — I10 Essential (primary) hypertension: Secondary | ICD-10-CM | POA: Diagnosis not present

## 2015-07-05 DIAGNOSIS — E559 Vitamin D deficiency, unspecified: Secondary | ICD-10-CM | POA: Diagnosis not present

## 2015-07-12 DIAGNOSIS — J029 Acute pharyngitis, unspecified: Secondary | ICD-10-CM | POA: Diagnosis not present

## 2015-07-12 DIAGNOSIS — J069 Acute upper respiratory infection, unspecified: Secondary | ICD-10-CM | POA: Diagnosis not present

## 2015-08-15 DIAGNOSIS — L03818 Cellulitis of other sites: Secondary | ICD-10-CM | POA: Diagnosis not present

## 2015-08-15 DIAGNOSIS — Z87891 Personal history of nicotine dependence: Secondary | ICD-10-CM | POA: Diagnosis not present

## 2015-08-15 DIAGNOSIS — M25511 Pain in right shoulder: Secondary | ICD-10-CM | POA: Diagnosis not present

## 2015-09-07 DIAGNOSIS — M67911 Unspecified disorder of synovium and tendon, right shoulder: Secondary | ICD-10-CM | POA: Diagnosis not present

## 2015-09-07 DIAGNOSIS — M24811 Other specific joint derangements of right shoulder, not elsewhere classified: Secondary | ICD-10-CM | POA: Diagnosis not present

## 2015-09-08 ENCOUNTER — Other Ambulatory Visit: Payer: Self-pay | Admitting: Orthopedic Surgery

## 2015-09-13 ENCOUNTER — Encounter (HOSPITAL_BASED_OUTPATIENT_CLINIC_OR_DEPARTMENT_OTHER): Payer: Self-pay | Admitting: *Deleted

## 2015-09-20 ENCOUNTER — Encounter (HOSPITAL_BASED_OUTPATIENT_CLINIC_OR_DEPARTMENT_OTHER)
Admission: RE | Admit: 2015-09-20 | Discharge: 2015-09-20 | Disposition: A | Discharge status: Home / Self Care | Payer: Medicare Other | Source: Ambulatory Visit | Attending: Orthopedic Surgery | Admitting: Orthopedic Surgery

## 2015-09-20 DIAGNOSIS — J302 Other seasonal allergic rhinitis: Secondary | ICD-10-CM | POA: Diagnosis not present

## 2015-09-20 DIAGNOSIS — E785 Hyperlipidemia, unspecified: Secondary | ICD-10-CM | POA: Diagnosis not present

## 2015-09-20 DIAGNOSIS — Z91040 Latex allergy status: Secondary | ICD-10-CM | POA: Diagnosis not present

## 2015-09-20 DIAGNOSIS — Z9049 Acquired absence of other specified parts of digestive tract: Secondary | ICD-10-CM | POA: Diagnosis not present

## 2015-09-20 DIAGNOSIS — M1990 Unspecified osteoarthritis, unspecified site: Secondary | ICD-10-CM | POA: Diagnosis not present

## 2015-09-20 DIAGNOSIS — G4700 Insomnia, unspecified: Secondary | ICD-10-CM | POA: Diagnosis not present

## 2015-09-20 DIAGNOSIS — I10 Essential (primary) hypertension: Secondary | ICD-10-CM | POA: Diagnosis not present

## 2015-09-20 DIAGNOSIS — Z86718 Personal history of other venous thrombosis and embolism: Secondary | ICD-10-CM | POA: Diagnosis not present

## 2015-09-20 DIAGNOSIS — R011 Cardiac murmur, unspecified: Secondary | ICD-10-CM | POA: Diagnosis not present

## 2015-09-20 DIAGNOSIS — Z90710 Acquired absence of both cervix and uterus: Secondary | ICD-10-CM | POA: Diagnosis not present

## 2015-09-20 DIAGNOSIS — Z880 Allergy status to penicillin: Secondary | ICD-10-CM | POA: Diagnosis not present

## 2015-09-20 DIAGNOSIS — S43401A Unspecified sprain of right shoulder joint, initial encounter: Secondary | ICD-10-CM | POA: Diagnosis not present

## 2015-09-20 DIAGNOSIS — Z96641 Presence of right artificial hip joint: Secondary | ICD-10-CM | POA: Diagnosis not present

## 2015-09-20 DIAGNOSIS — Z886 Allergy status to analgesic agent status: Secondary | ICD-10-CM | POA: Diagnosis not present

## 2015-09-20 DIAGNOSIS — F419 Anxiety disorder, unspecified: Secondary | ICD-10-CM | POA: Diagnosis not present

## 2015-09-20 DIAGNOSIS — M7541 Impingement syndrome of right shoulder: Secondary | ICD-10-CM | POA: Diagnosis not present

## 2015-09-20 DIAGNOSIS — M13811 Other specified arthritis, right shoulder: Secondary | ICD-10-CM | POA: Diagnosis not present

## 2015-09-20 LAB — BASIC METABOLIC PANEL
Anion gap: 9 (ref 5–15)
BUN: 17 mg/dL (ref 6–20)
CALCIUM: 9.3 mg/dL (ref 8.9–10.3)
CO2: 26 mmol/L (ref 22–32)
Chloride: 105 mmol/L (ref 101–111)
Creatinine, Ser: 0.81 mg/dL (ref 0.44–1.00)
GFR calc Af Amer: >60 mL/min (ref >60)
Glucose, Bld: 112 mg/dL — ABNORMAL HIGH (ref 65–99)
POTASSIUM: 4.5 mmol/L (ref 3.5–5.1)
SODIUM: 140 mmol/L (ref 135–145)

## 2015-09-20 NOTE — H&P (Signed)
[name] is an 64 y.o. female.    Chief Complaint: Right Shoulder pain  HPI:  [name] returns today with continued pain in her right shoulder.  It is worse with overhead activities or running she brings her arm across her body.  An MRI scan was accomplished.  A few years ago showing severe a.c. joint arthritis without lift impingement, labral tears and no full-thickness rotator cuff tears in this 64 year old woman.  She is failed conservative treatment with anti-inflammatory medicines, cortisone shot provided only couple hours worth of pain relief.  A few months ago.  Past Medical History  Diagnosis Date  . Hypertension     takes Maxzide daily  . Heart murmur     takes Bystolic nightly  . Hyperlipidemia     takes Crestor nightly  . History of blood clots 76yrs    was on Lovenox injections and Coumadin(was only on that for short period of time)  . H/O migraine     last 68yrs ago  . Arthritis   . Joint pain   . Joint swelling   . Hx of seasonal allergies     takes Levocetirizine prn  . Diverticulosis   . H/O blood transfusion reaction 2010    hives  . Anxiety     takes Xanax prn  . Insomnia     takes Ambien prn  . History of shingles 3-54yrs ago  . [id number])     Past Surgical History  Procedure Laterality Date  . Breast reduction surgery  1987  . Joint replacement      bilateral hip rt in 2007 and lt in 2010  . Cholecystectomy    . Abdominal hysterectomy    . Colonoscopy    . Esophagogastroduodenoscopy    . Total hip revision  04/14/2012    Procedure: TOTAL HIP REVISION;  Surgeon: Kerin Salen, MD;  Location: [place];  Service: Orthopedics;  Laterality: Right;  right total hip revision  . Eye surgery Right     eye lash removed  . Total hip revision Left 11/30/2013    Procedure: TOTAL HIP REVISION;  Surgeon: Kerin Salen, MD;  Location: [place];  Service: Orthopedics;  Laterality: Left;  . Tee without cardioversion N/A 04/12/2015    Procedure: TRANSESOPHAGEAL  ECHOCARDIOGRAM (TEE)/BUBBLE STUDY;  Surgeon: Adrian Prows, MD;  Location: [place];  Service: Cardiovascular;  Laterality: N/A;  . Nm myocar perf wall motion  11/04/2008    Normal    Family History  Problem Relation Age of Onset  . Hypertension Mother    Social History:  reports that she has been passively smoking Cigarettes.  She has smoked for the past 42 years. She has never used smokeless tobacco. She reports that she drinks alcohol. She reports that she does not use illicit drugs.  Allergies:  Allergies  Allergen Reactions  . Ibuprofen Hives and Swelling  . Latex Itching  . Penicillins Hives and Swelling    No prescriptions prior to admission    No results found for this or any previous visit (from the past 48 hour(s)). No results found.  Review of Systems  Constitutional: Negative.   HENT: Negative.   Eyes: Negative.   Respiratory: Negative.   Cardiovascular: Negative.   Gastrointestinal: Negative.   Genitourinary: Negative.   Musculoskeletal: Positive for joint pain.  Skin: Negative.   Neurological: Negative.   Endo/Heme/Allergies: Negative.   Psychiatric/Behavioral: Negative.     Height 4' 11.5" (1.511 m), weight 72.576 kg (160 lb).  Physical Exam  Constitutional: She is oriented to person, place, and time. She appears well-developed and well-nourished.  HENT:  Head: Normocephalic and atraumatic.  Eyes: Pupils are equal, round, and reactive to light.  Neck: Normal range of motion. Neck supple.  Cardiovascular: Intact distal pulses.   Respiratory: Effort normal.  Musculoskeletal:  Forward flexion is 1:30, external rotation is 60, rotator cuff power is intact.  Cross chest adduction test is 4+ positive flexion internal rotation impingement test is also 3+ positive.  Skin is intact.  Normal sensation to her fingers she does have some subjective numbness over the forearm but that doesn't bother her much.    Neurological: She is alert and oriented to person,  place, and time.  Skin: Skin is warm and dry.  Psychiatric: She has a normal mood and affect. Her behavior is normal. Judgment and thought content normal.     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  I will see her back at the time of surgical intervention.  Almir Botts R 09/20/2015, 8:34 AM

## 2015-09-21 ENCOUNTER — Encounter (HOSPITAL_BASED_OUTPATIENT_CLINIC_OR_DEPARTMENT_OTHER): Payer: Self-pay | Admitting: Anesthesiology

## 2015-09-21 ENCOUNTER — Ambulatory Visit (HOSPITAL_BASED_OUTPATIENT_CLINIC_OR_DEPARTMENT_OTHER): Payer: Medicare Other | Admitting: Anesthesiology

## 2015-09-21 ENCOUNTER — Ambulatory Visit (HOSPITAL_BASED_OUTPATIENT_CLINIC_OR_DEPARTMENT_OTHER)
Admission: RE | Admit: 2015-09-21 | Discharge: 2015-09-21 | Disposition: A | Discharge status: Home / Self Care | Payer: Medicare Other | Source: Ambulatory Visit | Attending: Orthopedic Surgery | Admitting: Orthopedic Surgery

## 2015-09-21 ENCOUNTER — Encounter (HOSPITAL_BASED_OUTPATIENT_CLINIC_OR_DEPARTMENT_OTHER)
Admission: RE | Disposition: A | Discharge status: Home / Self Care | Payer: Self-pay | Source: Ambulatory Visit | Attending: Orthopedic Surgery

## 2015-09-21 DIAGNOSIS — S43401A Unspecified sprain of right shoulder joint, initial encounter: Secondary | ICD-10-CM | POA: Diagnosis not present

## 2015-09-21 DIAGNOSIS — R011 Cardiac murmur, unspecified: Secondary | ICD-10-CM | POA: Diagnosis not present

## 2015-09-21 DIAGNOSIS — M13811 Other specified arthritis, right shoulder: Secondary | ICD-10-CM | POA: Diagnosis not present

## 2015-09-21 DIAGNOSIS — E785 Hyperlipidemia, unspecified: Secondary | ICD-10-CM | POA: Diagnosis not present

## 2015-09-21 DIAGNOSIS — Z886 Allergy status to analgesic agent status: Secondary | ICD-10-CM | POA: Insufficient documentation

## 2015-09-21 DIAGNOSIS — I10 Essential (primary) hypertension: Secondary | ICD-10-CM | POA: Diagnosis not present

## 2015-09-21 DIAGNOSIS — Z90710 Acquired absence of both cervix and uterus: Secondary | ICD-10-CM | POA: Insufficient documentation

## 2015-09-21 DIAGNOSIS — G8918 Other acute postprocedural pain: Secondary | ICD-10-CM | POA: Diagnosis not present

## 2015-09-21 DIAGNOSIS — M19011 Primary osteoarthritis, right shoulder: Secondary | ICD-10-CM | POA: Diagnosis not present

## 2015-09-21 DIAGNOSIS — G4700 Insomnia, unspecified: Secondary | ICD-10-CM | POA: Insufficient documentation

## 2015-09-21 DIAGNOSIS — M25511 Pain in right shoulder: Secondary | ICD-10-CM | POA: Diagnosis not present

## 2015-09-21 DIAGNOSIS — M7541 Impingement syndrome of right shoulder: Secondary | ICD-10-CM | POA: Diagnosis not present

## 2015-09-21 DIAGNOSIS — Z91040 Latex allergy status: Secondary | ICD-10-CM | POA: Insufficient documentation

## 2015-09-21 DIAGNOSIS — F419 Anxiety disorder, unspecified: Secondary | ICD-10-CM | POA: Insufficient documentation

## 2015-09-21 DIAGNOSIS — Z96641 Presence of right artificial hip joint: Secondary | ICD-10-CM | POA: Insufficient documentation

## 2015-09-21 DIAGNOSIS — M24111 Other articular cartilage disorders, right shoulder: Secondary | ICD-10-CM | POA: Diagnosis not present

## 2015-09-21 DIAGNOSIS — Z86718 Personal history of other venous thrombosis and embolism: Secondary | ICD-10-CM | POA: Insufficient documentation

## 2015-09-21 DIAGNOSIS — Z880 Allergy status to penicillin: Secondary | ICD-10-CM | POA: Insufficient documentation

## 2015-09-21 DIAGNOSIS — J302 Other seasonal allergic rhinitis: Secondary | ICD-10-CM | POA: Insufficient documentation

## 2015-09-21 DIAGNOSIS — M1990 Unspecified osteoarthritis, unspecified site: Secondary | ICD-10-CM | POA: Insufficient documentation

## 2015-09-21 DIAGNOSIS — Z9049 Acquired absence of other specified parts of digestive tract: Secondary | ICD-10-CM | POA: Insufficient documentation

## 2015-09-21 HISTORY — PX: SHOULDER ARTHROSCOPY WITH DISTAL CLAVICLE RESECTION: SHX5675

## 2015-09-21 SURGERY — SHOULDER ARTHROSCOPY WITH DISTAL CLAVICLE RESECTION
Anesthesia: General | Site: Shoulder | Laterality: Right

## 2015-09-21 MED ORDER — HYDROMORPHONE HCL 1 MG/ML IJ SOLN
INTRAMUSCULAR | Status: AC
  Filled 2015-09-21: qty 1

## 2015-09-21 MED ORDER — OXYCODONE HCL 5 MG/5ML PO SOLN: 5.0000 mg | Freq: Once | ORAL | Status: DC | PRN

## 2015-09-21 MED ORDER — CHLORHEXIDINE GLUCONATE 4 % EX LIQD: 60.0000 mL | Freq: Once | CUTANEOUS | Status: DC

## 2015-09-21 MED ORDER — HYDROCODONE-ACETAMINOPHEN 5-325 MG PO TABS: 1.0000 | ORAL_TABLET | Freq: Four times a day (QID) | ORAL | Status: DC | PRN

## 2015-09-21 MED ORDER — LIDOCAINE HCL (CARDIAC) 20 MG/ML IV SOLN
INTRAVENOUS | Status: AC
  Filled 2015-09-21: qty 5

## 2015-09-21 MED ORDER — PROPOFOL 10 MG/ML IV BOLUS
INTRAVENOUS | Status: AC
  Filled 2015-09-21: qty 20

## 2015-09-21 MED ORDER — DEXAMETHASONE SODIUM PHOSPHATE 10 MG/ML IJ SOLN
INTRAMUSCULAR | Status: AC
  Filled 2015-09-21: qty 1

## 2015-09-21 MED ORDER — VANCOMYCIN HCL IN DEXTROSE 1-5 GM/200ML-% IV SOLN
1000.0000 mg | INTRAVENOUS | Status: AC
  Administered 2015-09-21: 1000 mg via INTRAVENOUS

## 2015-09-21 MED ORDER — PHENYLEPHRINE HCL 10 MG/ML IJ SOLN
INTRAMUSCULAR | Status: DC | PRN
  Administered 2015-09-21 (×3): 40 ug via INTRAVENOUS

## 2015-09-21 MED ORDER — ONDANSETRON HCL 4 MG/2ML IJ SOLN
INTRAMUSCULAR | Status: AC
  Filled 2015-09-21: qty 2

## 2015-09-21 MED ORDER — PROPOFOL 10 MG/ML IV BOLUS
INTRAVENOUS | Status: DC | PRN
  Administered 2015-09-21: 50 mg via INTRAVENOUS
  Administered 2015-09-21: 200 mg via INTRAVENOUS

## 2015-09-21 MED ORDER — MIDAZOLAM HCL 2 MG/2ML IJ SOLN
1.0000 mg | INTRAMUSCULAR | Status: DC | PRN
  Administered 2015-09-21: 2 mg via INTRAVENOUS

## 2015-09-21 MED ORDER — BUPIVACAINE-EPINEPHRINE (PF) 0.5% -1:200000 IJ SOLN
INTRAMUSCULAR | Status: DC | PRN
  Administered 2015-09-21: 22 mL via PERINEURAL

## 2015-09-21 MED ORDER — ARTIFICIAL TEARS OP OINT
TOPICAL_OINTMENT | OPHTHALMIC | Status: AC
  Filled 2015-09-21: qty 3.5

## 2015-09-21 MED ORDER — OXYCODONE HCL 5 MG PO TABS: 5.0000 mg | ORAL_TABLET | Freq: Once | ORAL | Status: DC | PRN

## 2015-09-21 MED ORDER — FENTANYL CITRATE (PF) 100 MCG/2ML IJ SOLN
50.0000 ug | INTRAMUSCULAR | Status: AC | PRN
  Administered 2015-09-21: 25 ug via INTRAVENOUS
  Administered 2015-09-21: 100 ug via INTRAVENOUS
  Administered 2015-09-21: 25 ug via INTRAVENOUS
  Administered 2015-09-21: 50 ug via INTRAVENOUS

## 2015-09-21 MED ORDER — DEXTROSE-NACL 5-0.45 % IV SOLN: INTRAVENOUS | Status: DC

## 2015-09-21 MED ORDER — FENTANYL CITRATE (PF) 100 MCG/2ML IJ SOLN
INTRAMUSCULAR | Status: AC
  Filled 2015-09-21: qty 2

## 2015-09-21 MED ORDER — MIDAZOLAM HCL 2 MG/2ML IJ SOLN
INTRAMUSCULAR | Status: AC
  Filled 2015-09-21: qty 2

## 2015-09-21 MED ORDER — GLYCOPYRROLATE 0.2 MG/ML IJ SOLN: 0.2000 mg | Freq: Once | INTRAMUSCULAR | Status: DC | PRN

## 2015-09-21 MED ORDER — LIDOCAINE HCL (CARDIAC) 20 MG/ML IV SOLN
INTRAVENOUS | Status: DC | PRN
  Administered 2015-09-21: 50 mg via INTRAVENOUS

## 2015-09-21 MED ORDER — SUCCINYLCHOLINE CHLORIDE 20 MG/ML IJ SOLN
INTRAMUSCULAR | Status: DC | PRN
  Administered 2015-09-21: 100 mg via INTRAVENOUS

## 2015-09-21 MED ORDER — LACTATED RINGERS IV SOLN
INTRAVENOUS | Status: DC
  Administered 2015-09-21: 10:00:00 via INTRAVENOUS

## 2015-09-21 MED ORDER — SCOPOLAMINE 1 MG/3DAYS TD PT72: 1.0000 | MEDICATED_PATCH | Freq: Once | TRANSDERMAL | Status: DC | PRN

## 2015-09-21 MED ORDER — MEPERIDINE HCL 25 MG/ML IJ SOLN: 6.2500 mg | INTRAMUSCULAR | Status: DC | PRN

## 2015-09-21 MED ORDER — HYDROMORPHONE HCL 1 MG/ML IJ SOLN
0.2500 mg | INTRAMUSCULAR | Status: DC | PRN
  Administered 2015-09-21 (×4): 0.5 mg via INTRAVENOUS

## 2015-09-21 MED ORDER — VANCOMYCIN HCL IN DEXTROSE 1-5 GM/200ML-% IV SOLN
INTRAVENOUS | Status: AC
  Filled 2015-09-21: qty 200

## 2015-09-21 MED ORDER — DEXAMETHASONE SODIUM PHOSPHATE 4 MG/ML IJ SOLN
INTRAMUSCULAR | Status: DC | PRN
  Administered 2015-09-21: 10 mg via INTRAVENOUS

## 2015-09-21 SURGICAL SUPPLY — 61 items
BLADE AVERAGE 25X9 (BLADE) IMPLANT
BLADE CUTTER GATOR 3.5 (BLADE) ×2 IMPLANT
BLADE GREAT WHITE 4.2 (BLADE) ×2 IMPLANT
BLADE SURG 15 STRL LF DISP TIS (BLADE) IMPLANT
BLADE SURG 15 STRL SS (BLADE)
BUR EGG 3PK/BX (BURR) IMPLANT
BUR VERTEX HOODED 4.5 (BURR) ×2 IMPLANT
CANNULA 5.75X71 LONG (CANNULA) IMPLANT
CANNULA TWIST IN 8.25X7CM (CANNULA) IMPLANT
DECANTER SPIKE VIAL GLASS SM (MISCELLANEOUS) IMPLANT
DRAPE INCISE IOBAN 66X45 STRL (DRAPES) IMPLANT
DRAPE SHOULDER BEACH CHAIR (DRAPES) ×4 IMPLANT
DRSG PAD ABDOMINAL 8X10 ST (GAUZE/BANDAGES/DRESSINGS) IMPLANT
DURAPREP 26ML APPLICATOR (WOUND CARE) ×2 IMPLANT
ELECT REM PT RETURN 9FT ADLT (ELECTROSURGICAL) ×2
ELECTRODE REM PT RTRN 9FT ADLT (ELECTROSURGICAL) ×1 IMPLANT
GAUZE SPONGE 4X4 12PLY STRL (GAUZE/BANDAGES/DRESSINGS) ×2 IMPLANT
GAUZE XEROFORM 1X8 LF (GAUZE/BANDAGES/DRESSINGS) ×2 IMPLANT
GLOVE BIO SURGEON STRL SZ7.5 (GLOVE) ×2 IMPLANT
GLOVE BIO SURGEON STRL SZ8.5 (GLOVE) IMPLANT
GLOVE BIOGEL PI IND STRL 7.0 (GLOVE) ×3 IMPLANT
GLOVE BIOGEL PI IND STRL 8 (GLOVE) ×1 IMPLANT
GLOVE BIOGEL PI IND STRL 9 (GLOVE) ×1 IMPLANT
GLOVE BIOGEL PI INDICATOR 7.0 (GLOVE) ×3
GLOVE BIOGEL PI INDICATOR 8 (GLOVE) ×1
GLOVE BIOGEL PI INDICATOR 9 (GLOVE) ×1
GOWN STRL REUS W/ TWL LRG LVL3 (GOWN DISPOSABLE) ×2 IMPLANT
GOWN STRL REUS W/TWL LRG LVL3 (GOWN DISPOSABLE) ×2
GOWN STRL REUS W/TWL XL LVL3 (GOWN DISPOSABLE) ×2 IMPLANT
IV NS IRRIG 3000ML ARTHROMATIC (IV SOLUTION) ×4 IMPLANT
MANIFOLD NEPTUNE II (INSTRUMENTS) ×2 IMPLANT
NDL SAFETY ECLIPSE 18X1.5 (NEEDLE) ×1 IMPLANT
NDL SUT 6 .5 CRC .975X.05 MAYO (NEEDLE) IMPLANT
NEEDLE HYPO 18GX1.5 SHARP (NEEDLE) ×2
NEEDLE MAYO TAPER (NEEDLE)
NS IRRIG 1000ML POUR BTL (IV SOLUTION) IMPLANT
PACK ARTHROSCOPY DSU (CUSTOM PROCEDURE TRAY) ×2 IMPLANT
PACK BASIN DAY SURGERY FS (CUSTOM PROCEDURE TRAY) ×2 IMPLANT
PASSER SUT SWANSON 36MM LOOP (INSTRUMENTS) IMPLANT
PENCIL BUTTON HOLSTER BLD 10FT (ELECTRODE) IMPLANT
SET ARTHROSCOPY TUBING (MISCELLANEOUS) ×2
SET ARTHROSCOPY TUBING LN (MISCELLANEOUS) ×1 IMPLANT
SLEEVE SCD COMPRESS KNEE MED (MISCELLANEOUS) ×2 IMPLANT
SLING ARM FOAM STRAP LRG (SOFTGOODS) IMPLANT
SLING ARM IMMOBILIZER LRG (SOFTGOODS) IMPLANT
SLING ARM MED ADULT FOAM STRAP (SOFTGOODS) ×2 IMPLANT
SLING ARM XL FOAM STRAP (SOFTGOODS) IMPLANT
SPONGE LAP 4X18 X RAY DECT (DISPOSABLE) IMPLANT
SUCTION FRAZIER TIP 10 FR DISP (SUCTIONS) IMPLANT
SUT ETHIBOND 2 OS 4 DA (SUTURE) IMPLANT
SUT ETHILON 4 0 PS 2 18 (SUTURE) IMPLANT
SUT MNCRL AB 4-0 PS2 18 (SUTURE) IMPLANT
SUT VIC AB 3-0 PS1 18 (SUTURE)
SUT VIC AB 3-0 PS1 18XBRD (SUTURE) IMPLANT
SYR 5ML LL (SYRINGE) ×2 IMPLANT
SYR TB 1ML LL NO SAFETY (SYRINGE) IMPLANT
TAPE PAPER 3X10 WHT MICROPORE (GAUZE/BANDAGES/DRESSINGS) ×2 IMPLANT
TOWEL OR 17X24 6PK STRL BLUE (TOWEL DISPOSABLE) ×2 IMPLANT
TUBE CONNECTING 20X1/4 (TUBING) IMPLANT
WAND STAR VAC 90 (SURGICAL WAND) ×2 IMPLANT
WATER STERILE IRR 1000ML POUR (IV SOLUTION) ×2 IMPLANT

## 2015-09-21 NOTE — Op Note (Signed)
Preoperative diagnosis: right shoulder impingement syndrome, a.c. joint arthritis,  labral tear   Postoperative diagnosis: Same   Procedure: right shoulder arthroscopic anterior-inferior acromioplasty, formal distal clavicle excision, debridement of degenerative tearing of the labrum, subacromial bursectomy  Surgeon: Kathalene Frames. Mayer Camel M.D.   First assistant: Leighton Parody PA-C, was present for entire procedure, was needed for retraction, operation of arthroscopic equipment, placement of dressing.  Anesthetic: right shoulder interscalene block plus general endotracheal   Estimated blood loss: Minimal   Fluid replacement: 1200 cc of crystalloid.   Indications for procedure: Patient with shoulder impingement syndrome and a.c. joint arthritis who is failed conservative measures with anti-inflammatory medicines, therapy and exercises, but did get temporary relief from a cortisone injection in the subacromial space. Because of increasing pain and weakness patient desires elective arthroscopic evaluation with acromioplasty, distal clavicle excision and we will also address any other intra-articular pathology. MRI done in 2014 showed intact rotator cuff, degenerative tearing of the labrum, before meals joint arthritis, and irritation of the rotator cuff.Risks and benefits of surgery were discussed prior to the procedure and all questions answered.   Description of procedure: Patient was identified by arm band and given preoperative IV antibiotics in the holding area, as well as interscalene block anesthetic.[name] Patient was taken to the operating room where the appropriate anesthetic monitors were attached and general endotracheal anesthesia was induced with the patient in the supine position. Patient was then placed in the beachchair position and the right upper extremity prepped and draped in the usual sterile fashion from the wrist to the hemithorax. A time out procedure was performed. We began the  operation by making standard portals 1.5 cm anterior to the acromion, 1.5 cm lateral to the junction of the middle and posterior thirds of the acromion, and 1.5 cm posterior the posterior lateral corner of the acromion process. The inflow with gravity was placed anteriorly, the arthroscope laterally, and a 4.2 great-white sucker shaver posteriorly. The subacromial bursa was resected and we visualized the subacromial spur which was then removed with a 4.5 hooded vortex bur making 2 passes. We then visualized the arthritic a.c. joint and the inferior distal centimeter of the clavicle was resected using a 4.5 hooded vortex bur from the posterior portal. We then brought the burr anteriorly, the scope posteriorly, and the inflow laterally completing the 1 cm distal clavicle excision. Moving into the glenohumeral joint the articular and the labral cartilages were visualized and genitourinary the superior labrum was identified and debrided with a 3.5 mm Gator sucker shaver. The articular cartilage of the glenohumeral joint was in good condition. There is minor tearing of the internal leaflet of the supraspinatus rotator cuff tear the insertion that did not require debridement.. At this point the shoulder was irrigated out normal saline solution the arthroscopic instruments removed and a dressing of Xerofoam 4 x 4 dressing sponges, paper tape, and sling applied the patient was then placed in supine awakened extubated and taken to the recovery without difficulty.

## 2015-09-21 NOTE — Anesthesia Postprocedure Evaluation (Signed)
Anesthesia Post Note  Patient: [name]  Procedure(s) Performed: Procedure(s) (LRB): RIGHT SHOULDER ARTHROSCOPY WITH DISTAL CLAVICLE EXCISION DEBRIDE LABRAL TEAR ACROMIOPLASTY  (Right)  Patient location during evaluation: PACU Anesthesia Type: General and Regional Level of consciousness: awake and alert Pain management: pain level controlled Vital Signs Assessment: post-procedure vital signs reviewed and stable Respiratory status: spontaneous breathing, nonlabored ventilation and respiratory function stable Cardiovascular status: blood pressure returned to baseline and stable Postop Assessment: no signs of nausea or vomiting Anesthetic complications: no    Last Vitals:  Filed Vitals:   09/21/15 1315 09/21/15 1330  BP: 118/86 115/87  Pulse: 84 82  Temp:    Resp: 17 18    Last Pain:  Filed Vitals:   09/21/15 1341  PainSc: 5                  Makaelyn Aponte A

## 2015-09-21 NOTE — Interval H&P Note (Signed)
History and Physical Interval Note:  09/21/2015 11:35 AM  [name]  has presented today for surgery, with the diagnosis of RIGHT SHOULDER DEN JOINT DISEASE IMPINGEMENT AC JOINT DJD LIGAMENT TEAR  The various methods of treatment have been discussed with the patient and family. After consideration of risks, benefits and other options for treatment, the patient has consented to  Procedure(s): RIGHT SHOULDER ARTHROSCOPY WITH DISTAL CLAVICLE EXCISION DEBRIDE LABRAL TEAR ACROMIOPLASTY  (Right) as a surgical intervention .  The patient's history has been reviewed, patient examined, no change in status, stable for surgery.  I have reviewed the patient's chart and labs.  Questions were answered to the patient's satisfaction.     Kerin Salen

## 2015-09-21 NOTE — Anesthesia Procedure Notes (Addendum)
Anesthesia Regional Block:  Interscalene brachial plexus block  Pre-Anesthetic Checklist: ,, timeout performed, Correct Patient, Correct Site, Correct Laterality, Correct Procedure, Correct Position, site marked, Risks and benefits discussed,  Surgical consent,  Pre-op evaluation,  At surgeon's request and post-op pain management  Laterality: Right and Upper  Prep: chloraprep       Needles:  Injection technique: Single-shot  Needle Type: Echogenic Needle     Needle Length: 5cm 5 cm Needle Gauge: 21 and 21 G    Additional Needles:  Procedures: ultrasound guided (picture in chart) Interscalene brachial plexus block Narrative:  Start time: 09/21/2015 11:27 AM End time: 09/21/2015 11:32 AM Injection made incrementally with aspirations every 5 mL.  Performed by: Personally  Anesthesiologist: CREWS, DAVID   Procedure Name: Intubation Date/Time: 09/21/2015 11:54 AM Performed by: Lieutenant Diego Pre-anesthesia Checklist: Patient identified, Emergency Drugs available, Suction available and Patient being monitored Patient Re-evaluated:Patient Re-evaluated prior to inductionOxygen Delivery Method: Circle System Utilized Preoxygenation: Pre-oxygenation with 100% oxygen Intubation Type: IV induction Ventilation: Mask ventilation without difficulty Laryngoscope Size: Miller and 2 Grade View: Grade I Tube type: Oral Tube size: 7.0 mm Number of attempts: 1 Airway Equipment and Method: Stylet and Oral airway Placement Confirmation: ETT inserted through vocal cords under direct vision,  positive ETCO2 and breath sounds checked- equal and bilateral Secured at: 21 cm Tube secured with: Tape Dental Injury: Teeth and Oropharynx as per pre-operative assessment       Right Interscalene block image

## 2015-09-21 NOTE — Anesthesia Preprocedure Evaluation (Signed)
Anesthesia Evaluation  Patient identified by MRN, date of birth, ID band Patient awake    Reviewed: Allergy & Precautions, NPO status , Patient's Chart, lab work & pertinent test results  History of Anesthesia Complications (+) PONV  Airway Mallampati: I  TM Distance: >3 FB Neck ROM: Full    Dental  (+) Teeth Intact, Dental Advisory Given   Pulmonary    breath sounds clear to auscultation       Cardiovascular hypertension, Pt. on medications and Pt. on home beta blockers  Rhythm:Regular Rate:Normal     Neuro/Psych    GI/Hepatic   Endo/Other    Renal/GU      Musculoskeletal   Abdominal   Peds  Hematology   Anesthesia Other Findings   Reproductive/Obstetrics                             Anesthesia Physical Anesthesia Plan  ASA: I  Anesthesia Plan: General   Post-op Pain Management: MAC Combined w/ Regional for Post-op pain   Induction: Intravenous  Airway Management Planned: Oral ETT  Additional Equipment:   Intra-op Plan:   Post-operative Plan: Extubation in OR  Informed Consent: I have reviewed the patients History and Physical, chart, labs and discussed the procedure including the risks, benefits and alternatives for the proposed anesthesia with the patient or authorized representative who has indicated his/her understanding and acceptance.   Dental advisory given  Plan Discussed with: CRNA, Anesthesiologist and Surgeon  Anesthesia Plan Comments:         Anesthesia Quick Evaluation

## 2015-09-21 NOTE — Progress Notes (Signed)
Assisted Dr. Crews with right, ultrasound guided, interscalene  block. Side rails up, monitors on throughout procedure. See vital signs in flow sheet. Tolerated Procedure well. 

## 2015-09-21 NOTE — Discharge Instructions (Signed)
° ° °  Regional Anesthesia Blocks ° °1. Numbness or the inability to move the "blocked" extremity may last from 3-48 hours after placement. The length of time depends on the medication injected and your individual response to the medication. If the numbness is not going away after 48 hours, call your surgeon. ° °2. The extremity that is blocked will need to be protected until the numbness is gone and the  Strength has returned. Because you cannot feel it, you will need to take extra care to avoid injury. Because it may be weak, you may have difficulty moving it or using it. You may not know what position it is in without looking at it while the block is in effect. ° °3. For blocks in the legs and feet, returning to weight bearing and walking needs to be done carefully. You will need to wait until the numbness is entirely gone and the strength has returned. You should be able to move your leg and foot normally before you try and bear weight or walk. You will need someone to be with you when you first try to ensure you do not fall and possibly risk injury. ° °4. Bruising and tenderness at the needle site are common side effects and will resolve in a few days. ° °5. Persistent numbness or new problems with movement should be communicated to the surgeon or the [facility] ([phone])/ [facility] ([phone]). ° ° ° °Post Anesthesia Home Care Instructions ° °Activity: °Get plenty of rest for the remainder of the day. A responsible adult should stay with you for 24 hours following the procedure.  °For the next 24 hours, DO NOT: °-Drive a car °-Operate machinery °-Drink alcoholic beverages °-Take any medication unless instructed by your physician °-Make any legal decisions or sign important papers. ° °Meals: °Start with liquid foods such as gelatin or soup. Progress to regular foods as tolerated. Avoid greasy, spicy, heavy foods. If nausea and/or vomiting occur, drink only clear liquids until  the nausea and/or vomiting subsides. Call your physician if vomiting continues. ° °Special Instructions/Symptoms: °Your throat may feel dry or sore from the anesthesia or the breathing tube placed in your throat during surgery. If this causes discomfort, gargle with warm salt water. The discomfort should disappear within 24 hours. ° °If you had a scopolamine patch placed behind your ear for the management of post- operative nausea and/or vomiting: ° °1. The medication in the patch is effective for 72 hours, after which it should be removed.  Wrap patch in a tissue and discard in the trash. Wash hands thoroughly with soap and water. °2. You may remove the patch earlier than 72 hours if you experience unpleasant side effects which may include dry mouth, dizziness or visual disturbances. °3. Avoid touching the patch. Wash your hands with soap and water after contact with the patch. °  ° °

## 2015-09-21 NOTE — Transfer of Care (Signed)
Immediate Anesthesia Transfer of Care Note  Patient: [name]  Procedure(s) Performed: Procedure(s): RIGHT SHOULDER ARTHROSCOPY WITH DISTAL CLAVICLE EXCISION DEBRIDE LABRAL TEAR ACROMIOPLASTY  (Right)  Patient Location: PACU  Anesthesia Type:GA combined with regional for post-op pain  Level of Consciousness: awake and patient cooperative  Airway & Oxygen Therapy: Patient Spontanous Breathing and Patient connected to face mask oxygen  Post-op Assessment: Report given to RN and Post -op Vital signs reviewed and stable  Post vital signs: Reviewed and stable  Last Vitals:  Filed Vitals:   09/21/15 1130 09/21/15 1131  BP:    Pulse: 71 79  Temp:    Resp: 19 20    Complications: No apparent anesthesia complications

## 2015-09-22 ENCOUNTER — Encounter (HOSPITAL_BASED_OUTPATIENT_CLINIC_OR_DEPARTMENT_OTHER): Payer: Self-pay | Admitting: Orthopedic Surgery

## 2015-09-27 DIAGNOSIS — Z13820 Encounter for screening for osteoporosis: Secondary | ICD-10-CM | POA: Diagnosis not present

## 2015-09-29 DIAGNOSIS — M25511 Pain in right shoulder: Secondary | ICD-10-CM | POA: Diagnosis not present

## 2015-10-03 ENCOUNTER — Other Ambulatory Visit: Payer: Self-pay

## 2015-10-03 DIAGNOSIS — Z1231 Encounter for screening mammogram for malignant neoplasm of breast: Secondary | ICD-10-CM

## 2015-10-27 DIAGNOSIS — M25511 Pain in right shoulder: Secondary | ICD-10-CM | POA: Diagnosis not present

## 2015-12-06 ENCOUNTER — Ambulatory Visit
Admission: RE | Admit: 2015-12-06 | Discharge: 2015-12-06 | Disposition: A | Discharge status: Home / Self Care | Payer: Medicare Other | Source: Ambulatory Visit

## 2015-12-06 DIAGNOSIS — Z1231 Encounter for screening mammogram for malignant neoplasm of breast: Secondary | ICD-10-CM

## 2015-12-06 DIAGNOSIS — M25551 Pain in right hip: Secondary | ICD-10-CM | POA: Diagnosis not present

## 2015-12-08 ENCOUNTER — Other Ambulatory Visit (HOSPITAL_COMMUNITY): Payer: Self-pay | Admitting: Orthopedic Surgery

## 2015-12-08 DIAGNOSIS — M25551 Pain in right hip: Secondary | ICD-10-CM

## 2016-01-05 DIAGNOSIS — M79641 Pain in right hand: Secondary | ICD-10-CM | POA: Diagnosis not present

## 2016-01-05 DIAGNOSIS — E785 Hyperlipidemia, unspecified: Secondary | ICD-10-CM | POA: Diagnosis not present

## 2016-01-05 DIAGNOSIS — R202 Paresthesia of skin: Secondary | ICD-10-CM | POA: Diagnosis not present

## 2016-01-05 DIAGNOSIS — R7309 Other abnormal glucose: Secondary | ICD-10-CM | POA: Diagnosis not present

## 2016-01-05 DIAGNOSIS — I10 Essential (primary) hypertension: Secondary | ICD-10-CM | POA: Diagnosis not present

## 2016-01-08 ENCOUNTER — Emergency Department (HOSPITAL_BASED_OUTPATIENT_CLINIC_OR_DEPARTMENT_OTHER): Payer: Medicare Other

## 2016-01-08 ENCOUNTER — Emergency Department (HOSPITAL_BASED_OUTPATIENT_CLINIC_OR_DEPARTMENT_OTHER)
Admission: EM | Admit: 2016-01-08 | Discharge: 2016-01-08 | Disposition: A | Discharge status: Home / Self Care | Payer: Medicare Other | Attending: Emergency Medicine | Admitting: Emergency Medicine

## 2016-01-08 ENCOUNTER — Encounter (HOSPITAL_BASED_OUTPATIENT_CLINIC_OR_DEPARTMENT_OTHER): Payer: Self-pay

## 2016-01-08 DIAGNOSIS — R519 Headache, unspecified: Secondary | ICD-10-CM

## 2016-01-08 DIAGNOSIS — Z8659 Personal history of other mental and behavioral disorders: Secondary | ICD-10-CM | POA: Insufficient documentation

## 2016-01-08 DIAGNOSIS — R51 Headache: Secondary | ICD-10-CM | POA: Diagnosis not present

## 2016-01-08 DIAGNOSIS — Z8619 Personal history of other infectious and parasitic diseases: Secondary | ICD-10-CM | POA: Diagnosis not present

## 2016-01-08 DIAGNOSIS — Z79899 Other long term (current) drug therapy: Secondary | ICD-10-CM | POA: Insufficient documentation

## 2016-01-08 DIAGNOSIS — E785 Hyperlipidemia, unspecified: Secondary | ICD-10-CM | POA: Diagnosis not present

## 2016-01-08 DIAGNOSIS — R42 Dizziness and giddiness: Secondary | ICD-10-CM | POA: Insufficient documentation

## 2016-01-08 DIAGNOSIS — Z880 Allergy status to penicillin: Secondary | ICD-10-CM | POA: Insufficient documentation

## 2016-01-08 DIAGNOSIS — Z91040 Latex allergy status: Secondary | ICD-10-CM | POA: Diagnosis not present

## 2016-01-08 DIAGNOSIS — I10 Essential (primary) hypertension: Secondary | ICD-10-CM | POA: Insufficient documentation

## 2016-01-08 DIAGNOSIS — Z86718 Personal history of other venous thrombosis and embolism: Secondary | ICD-10-CM | POA: Diagnosis not present

## 2016-01-08 DIAGNOSIS — Z8739 Personal history of other diseases of the musculoskeletal system and connective tissue: Secondary | ICD-10-CM | POA: Diagnosis not present

## 2016-01-08 DIAGNOSIS — Z8719 Personal history of other diseases of the digestive system: Secondary | ICD-10-CM | POA: Insufficient documentation

## 2016-01-08 DIAGNOSIS — R110 Nausea: Secondary | ICD-10-CM | POA: Diagnosis not present

## 2016-01-08 DIAGNOSIS — Z8669 Personal history of other diseases of the nervous system and sense organs: Secondary | ICD-10-CM | POA: Diagnosis not present

## 2016-01-08 DIAGNOSIS — R011 Cardiac murmur, unspecified: Secondary | ICD-10-CM | POA: Insufficient documentation

## 2016-01-08 LAB — CBC WITH DIFFERENTIAL/PLATELET
Basophils Absolute: 0 10*3/uL (ref 0.0–0.1)
Basophils Relative: 1 %
EOS PCT: 0 %
Eosinophils Absolute: 0 10*3/uL (ref 0.0–0.7)
HEMATOCRIT: 40.6 % (ref 36.0–46.0)
Hemoglobin: 13.5 g/dL (ref 12.0–15.0)
LYMPHS ABS: 1.9 10*3/uL (ref 0.7–4.0)
LYMPHS PCT: 29 %
MCH: 28.7 pg (ref 26.0–34.0)
MCHC: 33.3 g/dL (ref 30.0–36.0)
MCV: 86.2 fL (ref 78.0–100.0)
MONO ABS: 0.6 10*3/uL (ref 0.1–1.0)
MONOS PCT: 10 %
Neutro Abs: 3.8 10*3/uL (ref 1.7–7.7)
Neutrophils Relative %: 60 %
PLATELETS: 214 10*3/uL (ref 150–400)
RBC: 4.71 MIL/uL (ref 3.87–5.11)
RDW: 15.8 % — AB (ref 11.5–15.5)
WBC: 6.4 10*3/uL (ref 4.0–10.5)

## 2016-01-08 LAB — BASIC METABOLIC PANEL
Anion gap: 12 (ref 5–15)
BUN: 23 mg/dL — AB (ref 6–20)
CHLORIDE: 104 mmol/L (ref 101–111)
CO2: 24 mmol/L (ref 22–32)
Calcium: 9.1 mg/dL (ref 8.9–10.3)
Creatinine, Ser: 0.82 mg/dL (ref 0.44–1.00)
GFR calc Af Amer: >60 mL/min (ref >60)
GFR calc non Af Amer: >60 mL/min (ref >60)
GLUCOSE: 96 mg/dL (ref 65–99)
POTASSIUM: 4.3 mmol/L (ref 3.5–5.1)
Sodium: 140 mmol/L (ref 135–145)

## 2016-01-08 LAB — SEDIMENTATION RATE: Sed Rate: 7 mm/hr (ref 0–22)

## 2016-01-08 MED ORDER — METOCLOPRAMIDE HCL 5 MG/ML IJ SOLN
10.0000 mg | Freq: Once | INTRAMUSCULAR | Status: AC
  Administered 2016-01-08: 10 mg via INTRAVENOUS
  Filled 2016-01-08: qty 2

## 2016-01-08 MED ORDER — ACETAMINOPHEN 325 MG PO TABS
650.0000 mg | ORAL_TABLET | Freq: Once | ORAL | Status: AC
  Administered 2016-01-08: 650 mg via ORAL
  Filled 2016-01-08: qty 2

## 2016-01-08 MED ORDER — DIPHENHYDRAMINE HCL 50 MG/ML IJ SOLN
25.0000 mg | Freq: Once | INTRAMUSCULAR | Status: AC
Start: 2016-01-08 — End: 2016-01-08
  Administered 2016-01-08: 25 mg via INTRAVENOUS
  Filled 2016-01-08: qty 1

## 2016-01-08 MED ORDER — SODIUM CHLORIDE 0.9 % IV BOLUS (SEPSIS)
1000.0000 mL | Freq: Once | INTRAVENOUS | Status: AC
  Administered 2016-01-08: 1000 mL via INTRAVENOUS

## 2016-01-08 NOTE — ED Provider Notes (Signed)
CSN: [id number]     Arrival date & time 01/08/16  1135 History   First MD Initiated Contact with Patient 01/08/16 1205     Chief Complaint  Patient presents with  . Headache     (Consider location/radiation/quality/duration/timing/severity/associated sxs/prior Treatment) HPI  65 year old female presents with an acute headache. The headache is right-sided and feels sharp and stabbing. Seems to be right over her temple. Patient states that last night the headache started around 8 PM and gradually progressed until she went to bed. When she awoke it seemed to be gone. However about 30 months prior to arrival the headache came back and was stronger than last night. Has felt nauseated but no vomiting. No blurry vision. No neck pain/stiffness. No weakness or numbness. Has a remote history of migraines but states this feels different. Pain is 8/10.  Past Medical History  Diagnosis Date  . Hypertension     takes Maxzide daily  . Heart murmur     takes Bystolic nightly  . Hyperlipidemia     takes Crestor nightly  . History of blood clots 43yr    was on Lovenox injections and Coumadin(was only on that for short period of time)  . H/O migraine     last 282yrago  . Arthritis   . Joint pain   . Joint swelling   . Hx of seasonal allergies     takes Levocetirizine prn  . Diverticulosis   . H/O blood transfusion reaction 2010    hives  . Anxiety     takes Xanax prn  . Insomnia     takes Ambien prn  . History of shingles [id number]  . [id number](122.4  Past Surgical History  Procedure Laterality Date  . Breast reduction surgery  1987  . Joint replacement      bilateral hip rt in 2007 and lt in 2010  . Cholecystectomy    . Abdominal hysterectomy    . Colonoscopy    . Esophagogastroduodenoscopy    . Total hip revision  04/14/2012    Procedure: TOTAL HIP REVISION;  Surgeon: FraKerin SalenD;  [facility];  Laterality: Right;  right total hip revision  . Eye  surgery Right     eye lash removed  . Total hip revision Left 11/30/2013    Procedure: TOTAL HIP REVISION;  Surgeon: FraKerin SalenD;  [facility] [place];  Laterality: Left;  . Tee without cardioversion N/A 04/12/2015    Procedure: TRANSESOPHAGEAL ECHOCARDIOGRAM (TEE)/BUBBLE STUDY;  Surgeon: JayAdrian ProwsD;  [facility];  Laterality: N/A;  . Nm myocar perf wall motion  11/04/2008    Normal  . Shoulder arthroscopy with distal clavicle resection Right 09/21/2015    Procedure: RIGHT SHOULDER ARTHROSCOPY WITH DISTAL CLAVICLE EXCISION DEBRIDE LABRAL TEAR ACROMIOPLASTY ;  Surgeon: FraFrederik PearD;  [facility];  Laterality: Right;   Family History  Problem Relation Age of Onset  . Hypertension Mother    Social History  Substance Use Topics  . Smoking status: Passive Smoke Exposure - Never Smoker -- 42 years    Types: Cigarettes  . Smokeless tobacco: Never Used  . Alcohol Use: Yes     Comment: occasionally   OB History    No data available     Review of Systems  Constitutional: Negative for fever.  Gastrointestinal: Positive for nausea. Negative for vomiting.  Musculoskeletal: Negative  for back pain, neck pain and neck stiffness.  Neurological: Positive for dizziness and headaches. Negative for weakness and numbness.  All other systems reviewed and are negative.     Allergies  Ibuprofen; Latex; and Penicillins  Home Medications   Prior to Admission medications   Medication Sig Start Date End Date Taking? Authorizing Provider  Bepotastine Besilate (BEPREVE) 1.5 % SOLN Place 1 drop into both eyes 2 (two) times daily.    Historical Provider, MD  cholecalciferol (VITAMIN D) 1000 UNITS tablet Take 1,000 Units by mouth daily.    Historical Provider, MD  levocetirizine (XYZAL) 5 MG tablet Take 5 mg by mouth daily as needed for allergies.     Historical Provider, MD  nebivolol  (BYSTOLIC) 5 MG tablet Take 5 mg by mouth every evening. For HTN    Historical Provider, MD  rosuvastatin (CRESTOR) 10 MG tablet Take 10 mg by mouth every evening. For hyperlipidemia    Historical Provider, MD  triamterene-hydrochlorothiazide (MAXZIDE-25) 37.5-25 MG per tablet Take 1 tablet by mouth daily. For HTN    Historical Provider, MD   BP 143/77 mmHg  Pulse 74  Temp(Src) 98.6 F (37 C) (Oral)  Resp 18  Ht 4' 11.5" (1.511 m)  Wt 160 lb (72.576 kg)  BMI 31.79 kg/m2  SpO2 99% Physical Exam  Constitutional: She is oriented to person, place, and time. She appears well-developed and well-nourished.  HENT:  Head: Normocephalic and atraumatic.  Right Ear: External ear normal.  Left Ear: External ear normal.  Nose: Nose normal.  No tenderness over temple  Eyes: EOM are normal. Pupils are equal, round, and reactive to light. Right eye exhibits no discharge. Left eye exhibits no discharge.  Neck: Normal range of motion. Neck supple.  No meningismus  Cardiovascular: Normal rate, regular rhythm and normal heart sounds.   Pulmonary/Chest: Effort normal and breath sounds normal.  Abdominal: Soft. There is no tenderness.  Neurological: She is alert and oriented to person, place, and time.  CN 2-12 grossly intact. 5/5 strength in all 4 extremities. Grossly normal sensation. Normal finger to nose. Normal gait  Skin: Skin is warm and dry.  Nursing note and vitals reviewed.   ED Course  Procedures (including critical care time) Labs Review Labs Reviewed  BASIC METABOLIC PANEL - Abnormal; Notable for the following:    BUN 23 (*)    All other components within normal limits  CBC WITH DIFFERENTIAL/PLATELET - Abnormal; Notable for the following:    RDW 15.8 (*)    All other components within normal limits  SEDIMENTATION RATE    Imaging Review Ct Head Wo Contrast  01/08/2016  CLINICAL DATA:  One day history of right-sided headache EXAM: CT HEAD WITHOUT CONTRAST TECHNIQUE: Contiguous  axial images were obtained from the base of the skull through the vertex without intravenous contrast. COMPARISON:  August 01, 2011 FINDINGS: The ventricles are normal in size and configuration. There is no intracranial mass, hemorrhage, extra-axial fluid collection, or midline shift. Gray-white compartments appear normal. No acute infarct evident. The bony calvarium appears intact. The mastoid air cells are clear. No intraorbital lesions apparent. IMPRESSION: Study within normal limits. Electronically Signed   By: Lowella Grip III M.D.   On: 01/08/2016 13:45   I have personally reviewed and evaluated these images and lab results as part of my medical decision-making.   EKG Interpretation None      MDM   Final diagnoses:  Right-sided headache    Unclear exactly why patient has  acute right sided headache. Neuro exam normal. No meningismus. No visual complaints. Given age and location, ESR sent but with this negative my suspicion is low for temporal arteritis. Doubt SAH given her worst pain was today and negative CT within a couple hours of pain onset today I think the likelihood of missed SAH is low. Do not feel LP needed at this time. Treat with tylenol, fluids, and f/u with PCP. Discussed strict return precautions.    Sherwood Gambler, MD 01/08/16 [phone]

## 2016-01-08 NOTE — ED Notes (Addendum)
Patient here with right sided headache since last pm. Reports that the head spasm started last night at 8pm that resolved and returned this am. Felt unsteady driving and nausea with same. Alert and oriented. Slow to respond to questions. Patient reports remote history of migraines.

## 2016-01-12 ENCOUNTER — Encounter (HOSPITAL_COMMUNITY): Payer: Medicare Other

## 2016-01-19 ENCOUNTER — Encounter (HOSPITAL_COMMUNITY): Payer: Medicare Other

## 2016-01-19 DIAGNOSIS — L723 Sebaceous cyst: Secondary | ICD-10-CM | POA: Diagnosis not present

## 2016-01-19 DIAGNOSIS — L821 Other seborrheic keratosis: Secondary | ICD-10-CM | POA: Diagnosis not present

## 2016-01-24 ENCOUNTER — Encounter (HOSPITAL_COMMUNITY)
Admission: RE | Admit: 2016-01-24 | Discharge: 2016-01-24 | Disposition: A | Discharge status: Home / Self Care | Payer: Medicare Other | Source: Ambulatory Visit | Attending: Orthopedic Surgery | Admitting: Orthopedic Surgery

## 2016-01-24 ENCOUNTER — Encounter (HOSPITAL_COMMUNITY): Payer: Medicare Other

## 2016-01-24 DIAGNOSIS — M25551 Pain in right hip: Secondary | ICD-10-CM

## 2016-01-24 MED ORDER — TECHNETIUM TC 99M MEDRONATE IV KIT: 25.0000 | PACK | Freq: Once | INTRAVENOUS | Status: DC | PRN

## 2016-02-22 DIAGNOSIS — M25551 Pain in right hip: Secondary | ICD-10-CM | POA: Diagnosis not present

## 2016-03-16 DIAGNOSIS — Z87891 Personal history of nicotine dependence: Secondary | ICD-10-CM | POA: Diagnosis not present

## 2016-03-16 DIAGNOSIS — G500 Trigeminal neuralgia: Secondary | ICD-10-CM | POA: Diagnosis not present

## 2016-03-16 DIAGNOSIS — I10 Essential (primary) hypertension: Secondary | ICD-10-CM | POA: Diagnosis not present

## 2016-03-26 ENCOUNTER — Ambulatory Visit (INDEPENDENT_AMBULATORY_CARE_PROVIDER_SITE_OTHER): Payer: Medicare Other | Admitting: Neurology

## 2016-03-26 ENCOUNTER — Encounter: Payer: Self-pay | Admitting: Neurology

## 2016-03-26 VITALS — BP 128/80 | HR 72 | Ht 59.5 in | Wt 156.0 lb

## 2016-03-26 DIAGNOSIS — G500 Trigeminal neuralgia: Secondary | ICD-10-CM

## 2016-03-26 HISTORY — DX: Trigeminal neuralgia: G50.0

## 2016-03-26 MED ORDER — GABAPENTIN 100 MG PO CAPS: ORAL_CAPSULE | ORAL | Status: DC

## 2016-03-26 NOTE — Progress Notes (Signed)
Reason for visit: Trigeminal neuralgia  Referring physician: Dr. Marcelo Baldy is a 65 y.o. female  History of present illness:  [name] is a 65 year old right-handed black female with a history of onset of dental pain that began 2 or 3 months ago, she sought out attention from her dentist to did a root canal on one of the maxillary teeth on the right. The patient noted that after the root canal she seemed to get worse with the pain, and the pain altered in quality. The patient began having hypersensitivity of the skin on the upper lip on the right. Chewing, swallowing, and talking worsens the pain. The patient will have hard sharp pain lasting 2 or 3 minutes, then the pain will clear. The patient denies numbness on the face. She denies any visual changes, double vision, speech alteration, or difficulty with choking with swallowing. The patient reports no numbness or weakness of the arms or legs, no changes in balance or difficulty controlling the bowels or the bladder. She has been seen by her primary care physician, a sedimentation rate and a C-reactive protein were done and were unremarkable. She is sent to this office for an evaluation of possible trigeminal neuralgia.  Past Medical History  Diagnosis Date  . Hypertension     takes Maxzide daily  . Heart murmur     takes Bystolic nightly  . Hyperlipidemia     takes Crestor nightly  . History of blood clots 60yrs    was on Lovenox injections and Coumadin(was only on that for short period of time)  . H/O migraine     last 52yrs ago  . Arthritis   . Joint pain   . Joint swelling   . Hx of seasonal allergies     takes Levocetirizine prn  . Diverticulosis   . H/O blood transfusion reaction 2010    hives  . Anxiety     takes Xanax prn  . Insomnia     takes Ambien prn  . History of shingles 3-14yrs ago  . Headache(784.0)   . Trigeminal neuralgia of right side of face 03/26/2016    V2 distribution    Past Surgical  History  Procedure Laterality Date  . Breast reduction surgery  1987  . Joint replacement      bilateral hip rt in 2007 and lt in 2010  . Cholecystectomy    . Abdominal hysterectomy    . Colonoscopy    . Esophagogastroduodenoscopy    . Total hip revision  04/14/2012    Procedure: TOTAL HIP REVISION;  Surgeon: Kerin Salen, MD;  Location: [place];  Service: Orthopedics;  Laterality: Right;  right total hip revision  . Eye surgery Right     eye lash removed  . Total hip revision Left 11/30/2013    Procedure: TOTAL HIP REVISION;  Surgeon: Kerin Salen, MD;  Location: [place];  Service: Orthopedics;  Laterality: Left;  . Tee without cardioversion N/A 04/12/2015    Procedure: TRANSESOPHAGEAL ECHOCARDIOGRAM (TEE)/BUBBLE STUDY;  Surgeon: Adrian Prows, MD;  Location: [place];  Service: Cardiovascular;  Laterality: N/A;  . Nm myocar perf wall motion  11/04/2008    Normal  . Shoulder arthroscopy with distal clavicle resection Right 09/21/2015    Procedure: RIGHT SHOULDER ARTHROSCOPY WITH DISTAL CLAVICLE EXCISION DEBRIDE LABRAL TEAR ACROMIOPLASTY ;  Surgeon: Frederik Pear, MD;  Location: [place];  Service: Orthopedics;  Laterality: Right;    Family History  Problem  Relation Age of Onset  . Heart attack Mother   . Alzheimer's disease Mother   . Diabetes Sister   . Hypertension Sister     Social history:  reports that she has been passively smoking Cigarettes.  She has smoked for the past 42 years. She has never used smokeless tobacco. She reports that she drinks alcohol. She reports that she does not use illicit drugs.  Medications:  Prior to Admission medications   Medication Sig Start Date End Date Taking? Authorizing Provider  Bepotastine Besilate (BEPREVE) 1.5 % SOLN Place 1 drop into both eyes 2 (two) times daily.   Yes Historical Provider, MD  cholecalciferol (VITAMIN D) 1000 UNITS tablet Take 1,000 Units by mouth daily.   Yes Historical Provider, MD  gabapentin (NEURONTIN)  100 MG capsule Take 100 mg by mouth at bedtime. 03/20/16  Yes Historical Provider, MD  levocetirizine (XYZAL) 5 MG tablet Take 5 mg by mouth daily as needed for allergies.    Yes Historical Provider, MD  nebivolol (BYSTOLIC) 5 MG tablet Take 5 mg by mouth every evening. For HTN   Yes Historical Provider, MD  rosuvastatin (CRESTOR) 10 MG tablet Take 10 mg by mouth every evening. For hyperlipidemia   Yes Historical Provider, MD  traMADol Veatrice Bourbon) 50 MG tablet  03/14/16  Yes Historical Provider, MD  triamterene-hydrochlorothiazide (MAXZIDE-25) 37.5-25 MG per tablet Take 1 tablet by mouth daily. For HTN   Yes Historical Provider, MD  zolpidem (AMBIEN) 5 MG tablet  03/18/16  Yes Historical Provider, MD      Allergies  Allergen Reactions  . Ibuprofen Hives and Swelling  . Latex Itching  . Penicillins Hives and Swelling    ROS:  Out of a complete 14 system review of symptoms, the patient complains only of the following symptoms, and all other reviewed systems are negative.  Headache  Blood pressure 128/80, pulse 72, height 4' 11.5" (1.511 m), weight 156 lb (70.761 kg).  Physical Exam  General: The patient is alert and cooperative at the time of the examination.  Eyes: Pupils are equal, round, and reactive to light. Discs are flat bilaterally.  Neck: The neck is supple, no carotid bruits are noted.  Respiratory: The respiratory examination is clear.  Cardiovascular: The cardiovascular examination reveals a regular rate and rhythm, no obvious murmurs or rubs are noted.  Skin: Extremities are without significant edema.  Neurologic Exam  Mental status: The patient is alert and oriented x 3 at the time of the examination. The patient has apparent normal recent and remote memory, with an apparently normal attention span and concentration ability.  Cranial nerves: Facial symmetry is present. There is good sensation of the face to pinprick and soft touch bilaterally. The strength of the  facial muscles and the muscles to head turning and shoulder shrug are normal bilaterally. Speech is well enunciated, no aphasia or dysarthria is noted. Extraocular movements are full. Visual fields are full. The tongue is midline, and the patient has symmetric elevation of the soft palate. No obvious hearing deficits are noted.  Motor: The motor testing reveals 5 over 5 strength of all 4 extremities. Good symmetric motor tone is noted throughout.  Sensory: Sensory testing is intact to pinprick, soft touch, vibration sensation, and position sense on all 4 extremities. No evidence of extinction is noted.  Coordination: Cerebellar testing reveals good finger-nose-finger and heel-to-shin bilaterally.  Gait and station: Gait is normal. Tandem gait is normal. Romberg is negative. No drift is seen.  Reflexes: Deep  tendon reflexes are symmetric and normal bilaterally. Toes are downgoing bilaterally.   CT head 01/08/2016:  IMPRESSION: Study within normal limits.  * CT scan images were reviewed online. I agree with the written report.    Assessment/Plan:  1. Right trigeminal neuralgia, V2 distribution  The patient has a history that is fully consistent with trigeminal neuralgia. The patient is on a very low dose of gabapentin taking only 100 mg at night. She will go to 100 mg 3 times daily for one week, then go to 200 mg 3 times daily. If she is tolerating this without good pain control, she is to contact our office and we will continue to go up on the dose as long as she can tolerate it. She will have MRI evaluation of the brain with and without gadolinium. A prior CT of the brain done in March was unremarkable.  Jill Alexanders MD 03/26/2016 8:19 PM  [facility] [address] [place], [place] [address]  Phone [phone] Fax [phone]

## 2016-03-26 NOTE — Patient Instructions (Signed)
   Neurontin (gabapentin) may result in drowsiness, ankle swelling, gait instability, or possibly dizziness. Please contact our office if significant side effects occur with this medication.  Trigeminal Neuralgia Trigeminal neuralgia is a nerve disorder that causes attacks of severe facial pain. The attacks last from a few seconds to several minutes. They can happen for days, weeks, or months and then go away for months or years. Trigeminal neuralgia is also called tic douloureux. CAUSES This condition is caused by damage to a nerve in the face that is called the trigeminal nerve. An attack can be triggered by:  Talking.  Chewing.  Putting on makeup.  Washing your face.  Shaving your face.  Brushing your teeth.  Touching your face. RISK FACTORS This condition is more likely to develop in:  Women.  People who are 36 years of age or older. SYMPTOMS The main symptom of this condition is pain in the jaw, lips, eyes, nose, scalp, forehead, and face. The pain may be intense, stabbing, electric, or shock-like. DIAGNOSIS This condition is diagnosed with a physical exam. A CT scan or MRI may be done to rule out other conditions that can cause facial pain. TREATMENT This condition may be treated with:  Avoiding the things that trigger your attacks.  Pain medicine.  Surgery. This may be done in severe cases if other medical treatment does not provide relief. HOME CARE INSTRUCTIONS  Take over-the-counter and prescription medicines only as told by your health care provider.  If you wish to get pregnant, talk with your health care provider before you start trying to get pregnant.  Avoid the things that trigger your attacks. It may help to:  Chew on the unaffected side of your mouth.  Avoid touching your face.  Avoid blasts of hot or cold air. SEEK MEDICAL CARE IF:  Your pain medicine is not helping.  You develop new, unexplained symptoms, such as:  Double vision.  Facial  weakness.  Changes in hearing or balance.  You become pregnant. SEEK IMMEDIATE MEDICAL CARE IF:  Your pain is unbearable, and your pain medicine does not help.   This information is not intended to replace advice given to you by your health care provider. Make sure you discuss any questions you have with your health care provider.   Document Released: 10/05/2000 Document Revised: 06/29/2015 Document Reviewed: 01/31/2015 Elsevier Interactive Patient Education [employer].

## 2016-03-29 ENCOUNTER — Other Ambulatory Visit: Payer: Self-pay | Admitting: Gastroenterology

## 2016-03-29 DIAGNOSIS — R1031 Right lower quadrant pain: Secondary | ICD-10-CM | POA: Diagnosis not present

## 2016-03-29 DIAGNOSIS — K7581 Nonalcoholic steatohepatitis (NASH): Secondary | ICD-10-CM | POA: Diagnosis not present

## 2016-03-29 DIAGNOSIS — K5730 Diverticulosis of large intestine without perforation or abscess without bleeding: Secondary | ICD-10-CM | POA: Diagnosis not present

## 2016-03-29 DIAGNOSIS — Z86010 Personal history of colonic polyps: Secondary | ICD-10-CM | POA: Diagnosis not present

## 2016-04-02 ENCOUNTER — Ambulatory Visit
Admission: RE | Admit: 2016-04-02 | Discharge: 2016-04-02 | Disposition: A | Discharge status: Home / Self Care | Payer: Medicare Other | Source: Ambulatory Visit | Attending: Gastroenterology | Admitting: Gastroenterology

## 2016-04-02 DIAGNOSIS — R102 Pelvic and perineal pain: Secondary | ICD-10-CM | POA: Diagnosis not present

## 2016-04-02 DIAGNOSIS — R1031 Right lower quadrant pain: Secondary | ICD-10-CM

## 2016-04-10 ENCOUNTER — Ambulatory Visit
Admission: RE | Admit: 2016-04-10 | Discharge: 2016-04-10 | Disposition: A | Discharge status: Home / Self Care | Payer: Medicare Other | Source: Ambulatory Visit | Attending: Neurology | Admitting: Neurology

## 2016-04-10 DIAGNOSIS — G500 Trigeminal neuralgia: Secondary | ICD-10-CM | POA: Diagnosis not present

## 2016-04-10 MED ORDER — GADOBENATE DIMEGLUMINE 529 MG/ML IV SOLN
14.0000 mL | Freq: Once | INTRAVENOUS | Status: AC | PRN
  Administered 2016-04-10: 14 mL via INTRAVENOUS

## 2016-04-12 ENCOUNTER — Telehealth: Payer: Self-pay | Admitting: Neurology

## 2016-04-12 NOTE — Telephone Encounter (Signed)
I called the patient. The MRI of the brain is ok. The patient indicates that the pain level is ok at this time with the gabapentin, she is to call if this changes.   MRI brain 04/11/16:  IMPRESSION: This MRI of the brain with and without contrast shows the following: 1. Brain parenchyma appears normal for age. There are a couple small T2/FLAIR hyperintense foci in the subcortical or deep white matter consistent with very minimal age-appropriate chronic microvascular ischemic change.  2. The right trigeminal nerve and dorsal root entry zone do not appear to be distorted by any blood vessel. 3. There are no acute findings.

## 2016-05-03 DIAGNOSIS — H25813 Combined forms of age-related cataract, bilateral: Secondary | ICD-10-CM | POA: Diagnosis not present

## 2016-05-03 DIAGNOSIS — H40013 Open angle with borderline findings, low risk, bilateral: Secondary | ICD-10-CM | POA: Diagnosis not present

## 2016-05-24 DIAGNOSIS — Z87891 Personal history of nicotine dependence: Secondary | ICD-10-CM | POA: Diagnosis not present

## 2016-05-24 DIAGNOSIS — R350 Frequency of micturition: Secondary | ICD-10-CM | POA: Diagnosis not present

## 2016-05-29 ENCOUNTER — Encounter: Payer: Self-pay | Admitting: Adult Health

## 2016-05-29 ENCOUNTER — Ambulatory Visit (INDEPENDENT_AMBULATORY_CARE_PROVIDER_SITE_OTHER): Payer: Medicare Other | Admitting: Adult Health

## 2016-05-29 VITALS — BP 105/66 | HR 81 | Ht 59.5 in | Wt 154.0 lb

## 2016-05-29 DIAGNOSIS — Z79899 Other long term (current) drug therapy: Secondary | ICD-10-CM | POA: Diagnosis not present

## 2016-05-29 DIAGNOSIS — I10 Essential (primary) hypertension: Secondary | ICD-10-CM | POA: Diagnosis not present

## 2016-05-29 DIAGNOSIS — G500 Trigeminal neuralgia: Secondary | ICD-10-CM

## 2016-05-29 DIAGNOSIS — R7309 Other abnormal glucose: Secondary | ICD-10-CM | POA: Diagnosis not present

## 2016-05-29 DIAGNOSIS — M79671 Pain in right foot: Secondary | ICD-10-CM | POA: Diagnosis not present

## 2016-05-29 MED ORDER — GABAPENTIN 100 MG PO CAPS: 100.0000 mg | ORAL_CAPSULE | Freq: Three times a day (TID) | ORAL | 1 refills | Status: DC

## 2016-05-29 NOTE — Progress Notes (Signed)
PATIENT: [name] DOB: 03/01/1951  REASON FOR VISIT: follow up- trigeminal neuralgia HISTORY FROM: patient  HISTORY OF PRESENT ILLNESS: [name] is a 65 year old female with a history of trigeminal neuralgia. She returns today for follow-up. She states that she's been taking gabapentin 100 mg 3 times a day and this has offered good benefit for her discomfort. She states that she tried taking 200 TID but was unable to tolerate. She currently denies having any facial pain. She states that she knows  Some of her triggers are eating hard food as well as chewing a certain way. She denies any new neurological symptoms. She returns today for an evaluation.  HISTORY 03/26/16 (WILLIS): [name] is a 65 year old right-handed black female with a history of onset of dental pain that began 2 or 3 months ago, she sought out attention from her dentist to did a root canal on one of the maxillary teeth on the right. The patient noted that after the root canal she seemed to get worse with the pain, and the pain altered in quality. The patient began having hypersensitivity of the skin on the upper lip on the right. Chewing, swallowing, and talking worsens the pain. The patient will have hard sharp pain lasting 2 or 3 minutes, then the pain will clear. The patient denies numbness on the face. She denies any visual changes, double vision, speech alteration, or difficulty with choking with swallowing. The patient reports no numbness or weakness of the arms or legs, no changes in balance or difficulty controlling the bowels or the bladder. She has been seen by her primary care physician, a sedimentation rate and a C-reactive protein were done and were unremarkable. She is sent to this office for an evaluation of possible trigeminal neuralgia.  REVIEW OF SYSTEMS: Out of a complete 14 system review of symptoms, the patient complains only of the following symptoms, and all other reviewed systems are negative.  See  history of present illness  ALLERGIES: Allergies  Allergen Reactions  . Ibuprofen Hives and Swelling  . Latex Itching  . Penicillins Hives and Swelling    HOME MEDICATIONS: Outpatient Medications Prior to Visit  Medication Sig Dispense Refill  . Bepotastine Besilate (BEPREVE) 1.5 % SOLN Place 1 drop into both eyes 2 (two) times daily.    . cholecalciferol (VITAMIN D) 1000 UNITS tablet Take 1,000 Units by mouth daily.    Marland Kitchen gabapentin (NEURONTIN) 100 MG capsule One capsule three times a day for one week, then take 2 capsules three times a day 180 capsule 1  . levocetirizine (XYZAL) 5 MG tablet Take 5 mg by mouth daily as needed for allergies.     Marland Kitchen nebivolol (BYSTOLIC) 5 MG tablet Take 5 mg by mouth every evening. For HTN    . rosuvastatin (CRESTOR) 10 MG tablet Take 10 mg by mouth every evening. For hyperlipidemia    . traMADol (ULTRAM) 50 MG tablet     . triamterene-hydrochlorothiazide (MAXZIDE-25) 37.5-25 MG per tablet Take 1 tablet by mouth daily. For HTN    . zolpidem (AMBIEN) 5 MG tablet      No facility-administered medications prior to visit.     PAST MEDICAL HISTORY: Past Medical History:  Diagnosis Date  . Anxiety    takes Xanax prn  . Arthritis   . Diverticulosis   . H/O blood transfusion reaction 2010   hives  . H/O migraine    last 58yrs ago  . Headache(784.0)   . Heart murmur  takes Bystolic nightly  . History of blood clots 16yrs   was on Lovenox injections and Coumadin(was only on that for short period of time)  . History of shingles 3-59yrs ago  . Hx of seasonal allergies    takes Levocetirizine prn  . Hyperlipidemia    takes Crestor nightly  . Hypertension    takes Maxzide daily  . Insomnia    takes Ambien prn  . Joint pain   . Joint swelling   . Trigeminal neuralgia of right side of face 03/26/2016   V2 distribution    PAST SURGICAL HISTORY: Past Surgical History:  Procedure Laterality Date  . ABDOMINAL HYSTERECTOMY    . BREAST REDUCTION  SURGERY  1987  . CHOLECYSTECTOMY    . COLONOSCOPY    . ESOPHAGOGASTRODUODENOSCOPY    . EYE SURGERY Right    eye lash removed  . JOINT REPLACEMENT     bilateral hip rt in 2007 and lt in 2010  . NM MYOCAR PERF WALL MOTION  11/04/2008   Normal  . SHOULDER ARTHROSCOPY WITH DISTAL CLAVICLE RESECTION Right 09/21/2015   Procedure: RIGHT SHOULDER ARTHROSCOPY WITH DISTAL CLAVICLE EXCISION DEBRIDE LABRAL TEAR ACROMIOPLASTY ;  Surgeon: Frederik Pear, MD;  Location: [place];  Service: Orthopedics;  Laterality: Right;  . TEE WITHOUT CARDIOVERSION N/A 04/12/2015   Procedure: TRANSESOPHAGEAL ECHOCARDIOGRAM (TEE)/BUBBLE STUDY;  Surgeon: Adrian Prows, MD;  Location: [place];  Service: Cardiovascular;  Laterality: N/A;  . TOTAL HIP REVISION  04/14/2012   Procedure: TOTAL HIP REVISION;  Surgeon: Kerin Salen, MD;  Location: [place];  Service: Orthopedics;  Laterality: Right;  right total hip revision  . TOTAL HIP REVISION Left 11/30/2013   Procedure: TOTAL HIP REVISION;  Surgeon: Kerin Salen, MD;  Location: [place];  Service: Orthopedics;  Laterality: Left;    FAMILY HISTORY: Family History  Problem Relation Age of Onset  . Heart attack Mother   . Alzheimer's disease Mother   . Diabetes Sister   . Hypertension Sister     SOCIAL HISTORY: Social History   Social History  . Marital status: Married    Spouse name: N/A  . Number of children: 2  . Years of education: 14   Occupational History  . Retired    Social History Main Topics  . Smoking status: Passive Smoke Exposure - Never Smoker    Years: 42.00    Types: Cigarettes  . Smokeless tobacco: Never Used  . Alcohol use Yes     Comment: occasionally  . Drug use: No  . Sexual activity: Yes    Birth control/ protection: Surgical   Other Topics Concern  . Not on file   Social History Narrative   Lives at home w/ her husband   Right-handed   About 1 cup of coffee every other day      PHYSICAL EXAM  Vitals:    05/29/16 1446  BP: 105/66  Pulse: 81  Weight: 154 lb (69.9 kg)  Height: 4' 11.5" (1.511 m)   Body mass index is 30.58 kg/m.  Generalized: Well developed, in no acute distress   Neurological examination  Mentation: Alert oriented to time, place, history taking. Follows all commands speech and language fluent Cranial nerve II-XII: Pupils were equal round reactive to light. Extraocular movements were full, visual field were full on confrontational test. Facial sensation and strength were normal. Uvula tongue midline. Head turning and shoulder shrug  were normal and symmetric. Motor: The motor testing reveals 5 over  5 strength of all 4 extremities. Good symmetric motor tone is noted throughout.  Sensory: Sensory testing is intact to soft touch on all 4 extremities. No evidence of extinction is noted.  Coordination: Cerebellar testing reveals good finger-nose-finger and heel-to-shin bilaterally.  Gait and station: Gait is normal. Tandem gait is unsteady. Romberg is negative. No drift is seen.  Reflexes: Deep tendon reflexes are symmetric and normal bilaterally.   DIAGNOSTIC DATA (LABS, IMAGING, TESTING) - I reviewed patient records, labs, notes, testing and imaging myself where available.  Lab Results  Component Value Date   WBC 6.4 01/08/2016   HGB 13.5 01/08/2016   HCT 40.6 01/08/2016   MCV 86.2 01/08/2016   PLT 214 01/08/2016      Component Value Date/Time   NA 140 01/08/2016 1235   K 4.3 01/08/2016 1235   CL 104 01/08/2016 1235   CO2 24 01/08/2016 1235   GLUCOSE 96 01/08/2016 1235   BUN 23 (H) 01/08/2016 1235   CREATININE 0.82 01/08/2016 1235   CALCIUM 9.1 01/08/2016 1235   PROT 7.1 06/11/2012 1432   ALBUMIN 3.7 06/11/2012 1432   AST 20 06/11/2012 1432   ALT 12 06/11/2012 1432   ALKPHOS 88 06/11/2012 1432   BILITOT 0.2 (L) 06/11/2012 1432   GFRNONAA >60 01/08/2016 1235   GFRAA >60 01/08/2016 1235          ASSESSMENT AND PLAN 65 y.o. year old female  has a past  medical history of Anxiety; Arthritis; Diverticulosis; H/O blood transfusion reaction (2010); H/O migraine; Headache(784.0); Heart murmur; History of blood clots (61yrs); History of shingles (3-71yrs ago); seasonal allergies; Hyperlipidemia; Hypertension; Insomnia; Joint pain; Joint swelling; and Trigeminal neuralgia of right side of face (03/26/2016). here with:  1. Trigeminal neuralgia right side  Overall the patient is doing well. She will continue on gabapentin 100 mg 3 times a day. She will follow-up in 6 months or sooner if needed.    Ward Givens, MSN, NP-C 05/29/2016, 3:41 PM [facility] [address], [place], [place] [address] [phone]

## 2016-05-29 NOTE — Progress Notes (Signed)
I have read the note, and I agree with the clinical assessment and plan.  WILLIS,CHARLES KEITH   

## 2016-05-29 NOTE — Patient Instructions (Signed)
Continue Gabapentin If your symptoms worsen or you develop new symptoms please let us know.   

## 2016-06-01 ENCOUNTER — Ambulatory Visit
Admission: RE | Admit: 2016-06-01 | Discharge: 2016-06-01 | Disposition: A | Discharge status: Home / Self Care | Payer: Medicare Other | Source: Ambulatory Visit | Attending: Internal Medicine | Admitting: Internal Medicine

## 2016-06-01 ENCOUNTER — Other Ambulatory Visit: Payer: Self-pay | Admitting: Internal Medicine

## 2016-06-01 DIAGNOSIS — M2011 Hallux valgus (acquired), right foot: Secondary | ICD-10-CM | POA: Diagnosis not present

## 2016-06-01 DIAGNOSIS — M79671 Pain in right foot: Secondary | ICD-10-CM

## 2016-06-19 DIAGNOSIS — H20013 Primary iridocyclitis, bilateral: Secondary | ICD-10-CM | POA: Diagnosis not present

## 2016-06-19 DIAGNOSIS — H109 Unspecified conjunctivitis: Secondary | ICD-10-CM | POA: Diagnosis not present

## 2016-06-22 DIAGNOSIS — H20013 Primary iridocyclitis, bilateral: Secondary | ICD-10-CM | POA: Diagnosis not present

## 2016-06-22 DIAGNOSIS — H40013 Open angle with borderline findings, low risk, bilateral: Secondary | ICD-10-CM | POA: Diagnosis not present

## 2016-06-28 ENCOUNTER — Encounter (HOSPITAL_COMMUNITY): Payer: Self-pay | Admitting: *Deleted

## 2016-06-28 ENCOUNTER — Emergency Department (HOSPITAL_BASED_OUTPATIENT_CLINIC_OR_DEPARTMENT_OTHER)
Admit: 2016-06-28 | Discharge: 2016-06-28 | Disposition: A | Discharge status: Home / Self Care | Payer: Medicare Other | Attending: Emergency Medicine | Admitting: Emergency Medicine

## 2016-06-28 ENCOUNTER — Emergency Department (HOSPITAL_COMMUNITY)
Admission: EM | Admit: 2016-06-28 | Discharge: 2016-06-28 | Disposition: A | Discharge status: Home / Self Care | Payer: Medicare Other | Attending: Emergency Medicine | Admitting: Emergency Medicine

## 2016-06-28 DIAGNOSIS — I10 Essential (primary) hypertension: Secondary | ICD-10-CM | POA: Diagnosis not present

## 2016-06-28 DIAGNOSIS — Z91040 Latex allergy status: Secondary | ICD-10-CM | POA: Diagnosis not present

## 2016-06-28 DIAGNOSIS — Z7722 Contact with and (suspected) exposure to environmental tobacco smoke (acute) (chronic): Secondary | ICD-10-CM | POA: Insufficient documentation

## 2016-06-28 DIAGNOSIS — M79662 Pain in left lower leg: Secondary | ICD-10-CM | POA: Diagnosis not present

## 2016-06-28 DIAGNOSIS — M79669 Pain in unspecified lower leg: Secondary | ICD-10-CM | POA: Diagnosis not present

## 2016-06-28 DIAGNOSIS — Z96643 Presence of artificial hip joint, bilateral: Secondary | ICD-10-CM | POA: Diagnosis not present

## 2016-06-28 DIAGNOSIS — Z79899 Other long term (current) drug therapy: Secondary | ICD-10-CM | POA: Insufficient documentation

## 2016-06-28 DIAGNOSIS — M79606 Pain in leg, unspecified: Secondary | ICD-10-CM

## 2016-06-28 DIAGNOSIS — M79609 Pain in unspecified limb: Secondary | ICD-10-CM | POA: Diagnosis not present

## 2016-06-28 DIAGNOSIS — M79661 Pain in right lower leg: Secondary | ICD-10-CM | POA: Diagnosis not present

## 2016-06-28 MED ORDER — TRAMADOL HCL 50 MG PO TABS: 50.0000 mg | ORAL_TABLET | Freq: Four times a day (QID) | ORAL | 0 refills | Status: DC | PRN

## 2016-06-28 MED ORDER — TRAMADOL HCL 50 MG PO TABS
50.0000 mg | ORAL_TABLET | Freq: Once | ORAL | Status: AC
  Administered 2016-06-28: 50 mg via ORAL

## 2016-06-28 NOTE — ED Notes (Signed)
Vascular US at bedside.

## 2016-06-28 NOTE — Progress Notes (Signed)
VASCULAR LAB PRELIMINARY  PRELIMINARY  PRELIMINARY  PRELIMINARY  Bilateral lower extremity venous duplex completed.    Preliminary report:  Bilateral:  No evidence of DVT, superficial thrombosis, or Baker's Cyst.   Shyam Dawson, RVS 06/28/2016, 11:18 AM

## 2016-06-28 NOTE — ED Notes (Signed)
MD at bedside. 

## 2016-06-28 NOTE — ED Triage Notes (Signed)
Pt reports bila leg pain since yesterday, took tylenol yesterday with relief and recurred this am.  Pt denies any swelling or injury to legs.  Pt is ambulatory without difficulty.

## 2016-06-29 ENCOUNTER — Emergency Department (HOSPITAL_COMMUNITY)
Admission: EM | Admit: 2016-06-29 | Discharge: 2016-06-29 | Disposition: A | Discharge status: Home / Self Care | Payer: Medicare Other | Attending: Emergency Medicine | Admitting: Emergency Medicine

## 2016-06-29 ENCOUNTER — Encounter (HOSPITAL_COMMUNITY): Payer: Self-pay | Admitting: Emergency Medicine

## 2016-06-29 DIAGNOSIS — Z7722 Contact with and (suspected) exposure to environmental tobacco smoke (acute) (chronic): Secondary | ICD-10-CM | POA: Diagnosis not present

## 2016-06-29 DIAGNOSIS — I10 Essential (primary) hypertension: Secondary | ICD-10-CM | POA: Diagnosis not present

## 2016-06-29 DIAGNOSIS — R040 Epistaxis: Secondary | ICD-10-CM | POA: Diagnosis not present

## 2016-06-29 MED ORDER — SILVER NITRATE-POT NITRATE 75-25 % EX MISC
1.0000 "application " | Freq: Once | CUTANEOUS | Status: AC
  Administered 2016-06-29: 1 via TOPICAL
  Filled 2016-06-29: qty 1

## 2016-06-29 NOTE — ED Provider Notes (Signed)
[facility] DEPT Provider Note   CSN: [id number] Arrival date & time: 06/29/16  0244     History   Chief Complaint Chief Complaint  Patient presents with  . Epistaxis    HPI [name] is a 64 y.o. female.  65 yo F with a chief complaint of epistaxis. The started a couple hours ago. Patient having diffuse output of blood and multiple clots. Denies blood thinner use. Was feeling mildly lightheaded. Family is concerned by the amount of blood that they saw. No longer bleeding on arrival.   The history is provided by the patient and the spouse.  Epistaxis   This is a new problem. The current episode started less than 1 hour ago. The problem occurs constantly. The problem has not changed since onset.The bleeding has been from the left nare. She has tried nothing for the symptoms. The treatment provided no relief.    Past Medical History:  Diagnosis Date  . Anxiety    takes Xanax prn  . Arthritis   . Diverticulosis   . H/O blood transfusion reaction 2010   hives  . H/O migraine    last 61yrs ago  . Headache(784.0)   . Heart murmur    takes Bystolic nightly  . History of blood clots 40yrs   was on Lovenox injections and Coumadin(was only on that for short period of time)  . History of shingles 3-79yrs ago  . Hx of seasonal allergies    takes Levocetirizine prn  . Hyperlipidemia    takes Crestor nightly  . Hypertension    takes Maxzide daily  . Insomnia    takes Ambien prn  . Joint pain   . Joint swelling   . Trigeminal neuralgia of right side of face 03/26/2016   V2 distribution    Patient Active Problem List   Diagnosis Date Noted  . Trigeminal neuralgia of right side of face 03/26/2016  . Hyperlipidemia 12/04/2013  . Hypertension 12/04/2013  . Constipation 12/04/2013  . Insomnia 12/04/2013  . Acute blood loss anemia 12/04/2013  . Left hip pain 12/04/2013  . 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 Pain due to total hip replacement ([place]) 04/16/2012     Past Surgical History:  Procedure Laterality Date  . ABDOMINAL HYSTERECTOMY    . BREAST REDUCTION SURGERY  1987  . CHOLECYSTECTOMY    . COLONOSCOPY    . ESOPHAGOGASTRODUODENOSCOPY    . EYE SURGERY Right    eye lash removed  . JOINT REPLACEMENT     bilateral hip rt in 2007 and lt in 2010  . NM MYOCAR PERF WALL MOTION  11/04/2008   Normal  . SHOULDER ARTHROSCOPY WITH DISTAL CLAVICLE RESECTION Right 09/21/2015   Procedure: RIGHT SHOULDER ARTHROSCOPY WITH DISTAL CLAVICLE EXCISION DEBRIDE LABRAL TEAR ACROMIOPLASTY ;  Surgeon: Frederik Pear, MD;  Location: [facility];  Service: Orthopedics;  Laterality: Right;  . TEE WITHOUT CARDIOVERSION N/A 04/12/2015   Procedure: TRANSESOPHAGEAL ECHOCARDIOGRAM (TEE)/BUBBLE STUDY;  Surgeon: Adrian Prows, MD;  Location: [facility];  Service: Cardiovascular;  Laterality: N/A;  . TOTAL HIP REVISION  04/14/2012   Procedure: TOTAL HIP REVISION;  Surgeon: Kerin Salen, MD;  Location: [facility];  Service: Orthopedics;  Laterality: Right;  right total hip revision  . TOTAL HIP REVISION Left 11/30/2013   Procedure: TOTAL HIP REVISION;  Surgeon: Kerin Salen, MD;  Location: [facility];  Service: Orthopedics;  Laterality: Left;    OB History    No data available  Home Medications    Prior to Admission medications   Medication Sig Start Date End Date Taking? Authorizing Provider  Bepotastine Besilate (BEPREVE) 1.5 % SOLN Place 1 drop into both eyes 2 (two) times daily.    Historical Provider, MD  cholecalciferol (VITAMIN D) 1000 UNITS tablet Take 1,000 Units by mouth daily.    Historical Provider, MD  gabapentin (NEURONTIN) 100 MG capsule Take 1 capsule (100 mg total) by mouth 3 (three) times daily. Patient not taking: Reported on 06/28/2016 05/29/16   Ward Givens, NP  levocetirizine (XYZAL) 5 MG tablet Take 5 mg by mouth daily as needed for allergies.     Historical Provider, MD  nebivolol (BYSTOLIC) 5 MG tablet Take 5 mg by mouth every evening. For  HTN    Historical Provider, MD  rosuvastatin (CRESTOR) 10 MG tablet Take 10 mg by mouth every evening. For hyperlipidemia    Historical Provider, MD  traMADol (ULTRAM) 50 MG tablet Take 1 tablet (50 mg total) by mouth every 6 (six) hours as needed. 06/28/16   Virgel Manifold, MD  triamterene-hydrochlorothiazide (MAXZIDE-25) 37.5-25 MG per tablet Take 1 tablet by mouth daily. For HTN    Historical Provider, MD  zolpidem (AMBIEN) 5 MG tablet Take 5 mg by mouth at bedtime as needed for sleep.  03/18/16   Historical Provider, MD    Family History Family History  Problem Relation Age of Onset  . Heart attack Mother   . Alzheimer's disease Mother   . Diabetes Sister   . Hypertension Sister     Social History Social History  Substance Use Topics  . Smoking status: Passive Smoke Exposure - Never Smoker    Years: 42.00    Types: Cigarettes  . Smokeless tobacco: Never Used  . Alcohol use Yes     Comment: occasionally     Allergies   Ibuprofen; Latex; and Penicillins   Review of Systems Review of Systems  Constitutional: Negative for chills and fever.  HENT: Positive for nosebleeds. Negative for congestion and rhinorrhea.   Eyes: Negative for redness and visual disturbance.  Respiratory: Negative for shortness of breath and wheezing.   Cardiovascular: Negative for chest pain and palpitations.  Gastrointestinal: Negative for nausea and vomiting.  Genitourinary: Negative for dysuria and urgency.  Musculoskeletal: Negative for arthralgias and myalgias.  Skin: Negative for pallor and wound.  Neurological: Negative for dizziness and headaches.     Physical Exam Updated Vital Signs BP 119/80 (BP Location: Right Arm)   Pulse 77   Temp 98 F (36.7 C) (Oral)   Resp 18   Ht 4' 11.5" (1.511 m)   Wt 154 lb (69.9 kg)   SpO2 98%   BMI 30.58 kg/m   Physical Exam  Constitutional: She is oriented to person, place, and time. She appears well-developed and well-nourished. No distress.   HENT:  Head: Normocephalic and atraumatic.  Increased vascularity to Kiesselbach's plexus on the left  Eyes: EOM are normal. Pupils are equal, round, and reactive to light.  Neck: Normal range of motion. Neck supple.  Cardiovascular: Normal rate and regular rhythm.  Exam reveals no gallop and no friction rub.   No murmur heard. Pulmonary/Chest: Effort normal. She has no wheezes. She has no rales.  Abdominal: Soft. She exhibits no distension. There is no tenderness.  Musculoskeletal: She exhibits no edema or tenderness.  Neurological: She is alert and oriented to person, place, and time.  Skin: Skin is warm and dry. She is not diaphoretic.  Psychiatric: She  has a normal mood and affect. Her behavior is normal.  Nursing note and vitals reviewed.    ED Treatments / Results  Labs (all labs ordered are listed, but only abnormal results are displayed) Labs Reviewed - No data to display  EKG  EKG Interpretation None       Radiology No results found.  Procedures .Epistaxis Management Date/Time: 06/29/2016 4:18 AM Performed by: Tyrone Nine Misheel Gowans Authorized by: Deno Etienne   Consent:    Consent obtained:  Verbal   Consent given by:  Patient   Risks discussed:  Bleeding, infection, nasal injury and pain   Alternatives discussed:  No treatment Anesthesia (see MAR for exact dosages):    Anesthesia method:  None Procedure details:    Treatment site:  L anterior   Treatment method:  Silver nitrate   Treatment complexity:  Limited   Treatment episode: initial   Post-procedure details:    Assessment:  Bleeding stopped   Patient tolerance of procedure:  Tolerated well, no immediate complications   (including critical care time)  Medications Ordered in ED Medications  silver nitrate applicators applicator 1 application (not administered)     Initial Impression / Assessment and Plan / ED Course  I have reviewed the triage vital signs and the nursing notes.  Pertinent labs &  imaging results that were available during my care of the patient were reviewed by me and considered in my medical decision making (see chart for details).  Clinical Course    65 yo F With a chief complaint of epistaxis. Patient with some vascularity at [facility] plexus will cauterized. 4:19 AM:  I have discussed the diagnosis/risks/treatment options with the patient and family and believe the pt to be eligible for discharge home to follow-up with PCP. We also discussed returning to the ED immediately if new or worsening sx occur. We discussed the sx which are most concerning (e.g., uncontrolled bleeding with direct pressure) that necessitate immediate return. Medications administered to the patient during their visit and any new prescriptions provided to the patient are listed below.  Medications given during this visit Medications  silver nitrate applicators applicator 1 application (not administered)     The patient appears reasonably screen and/or stabilized for discharge and I doubt any other medical condition or other [facility] requiring further screening, evaluation, or treatment in the ED at this time prior to discharge.    Final Clinical Impressions(s) / ED Diagnoses   Final diagnoses:  Epistaxis    New Prescriptions New Prescriptions   No medications on file     Deno Etienne, DO 06/29/16 [id number]

## 2016-06-29 NOTE — ED Triage Notes (Signed)
Pt states she woke up to go to the bathroom and had a nosebleed  Pt states she called EMS and by the time she got here the bleeding had stopped but she says it feels like it is still running down the back of her throat  Pt is also c/o headache

## 2016-06-29 NOTE — ED Triage Notes (Addendum)
Per EMS patient to ER for nose bleed.  No current bleeding noted at present. EMS states nose bleed occurred for 15 minutes prior to their arrival on scene.  Per EMS approximately 23mL of blood noted.

## 2016-07-02 DIAGNOSIS — Z09 Encounter for follow-up examination after completed treatment for conditions other than malignant neoplasm: Secondary | ICD-10-CM | POA: Diagnosis not present

## 2016-07-02 DIAGNOSIS — M79606 Pain in leg, unspecified: Secondary | ICD-10-CM | POA: Diagnosis not present

## 2016-07-02 DIAGNOSIS — R040 Epistaxis: Secondary | ICD-10-CM | POA: Diagnosis not present

## 2016-07-03 DIAGNOSIS — I272 Other secondary pulmonary hypertension: Secondary | ICD-10-CM | POA: Diagnosis not present

## 2016-07-04 NOTE — ED Provider Notes (Signed)
[facility] DEPT Provider Note   CSN: [id number] Arrival date & time: 06/28/16  [id number]     History   Chief Complaint Chief Complaint  Patient presents with  . Leg Pain    HPI [name] is a 65 y.o. female.  HPI   65yF with leg pain. B/l, R>L. No trauma,. Onset yesterday. Persistent since then. No appreciable exacerbating or relieving factors. No swelling. Hx of blood clots. No rash. No swelling. Non numbness or tingling.   Past Medical History:  Diagnosis Date  . Anxiety    takes Xanax prn  . Arthritis   . Diverticulosis   . H/O blood transfusion reaction 2010   hives  . H/O migraine    last 39yrs ago  . Headache(784.0)   . Heart murmur    takes Bystolic nightly  . History of blood clots 10yrs   was on Lovenox injections and Coumadin(was only on that for short period of time)  . History of shingles 3-82yrs ago  . Hx of seasonal allergies    takes Levocetirizine prn  . Hyperlipidemia    takes Crestor nightly  . Hypertension    takes Maxzide daily  . Insomnia    takes Ambien prn  . Joint pain   . Joint swelling   . Trigeminal neuralgia of right side of face 03/26/2016   V2 distribution    Patient Active Problem List   Diagnosis Date Noted  . Trigeminal neuralgia of right side of face 03/26/2016  . Hyperlipidemia 12/04/2013  . Hypertension 12/04/2013  . Constipation 12/04/2013  . Insomnia 12/04/2013  . Acute blood loss anemia 12/04/2013  . Left hip pain 12/04/2013  . 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 Pain due to total hip replacement ([place]) 04/16/2012    Past Surgical History:  Procedure Laterality Date  . ABDOMINAL HYSTERECTOMY    . BREAST REDUCTION SURGERY  1987  . CHOLECYSTECTOMY    . COLONOSCOPY    . ESOPHAGOGASTRODUODENOSCOPY    . EYE SURGERY Right    eye lash removed  . JOINT REPLACEMENT     bilateral hip rt in 2007 and lt in 2010  . NM MYOCAR PERF WALL MOTION  11/04/2008   Normal  . SHOULDER ARTHROSCOPY WITH DISTAL CLAVICLE  RESECTION Right 09/21/2015   Procedure: RIGHT SHOULDER ARTHROSCOPY WITH DISTAL CLAVICLE EXCISION DEBRIDE LABRAL TEAR ACROMIOPLASTY ;  Surgeon: Frederik Pear, MD;  Location: [facility];  Service: Orthopedics;  Laterality: Right;  . TEE WITHOUT CARDIOVERSION N/A 04/12/2015   Procedure: TRANSESOPHAGEAL ECHOCARDIOGRAM (TEE)/BUBBLE STUDY;  Surgeon: Adrian Prows, MD;  Location: [facility];  Service: Cardiovascular;  Laterality: N/A;  . TOTAL HIP REVISION  04/14/2012   Procedure: TOTAL HIP REVISION;  Surgeon: Kerin Salen, MD;  Location: [facility];  Service: Orthopedics;  Laterality: Right;  right total hip revision  . TOTAL HIP REVISION Left 11/30/2013   Procedure: TOTAL HIP REVISION;  Surgeon: Kerin Salen, MD;  Location: [facility];  Service: Orthopedics;  Laterality: Left;    OB History    No data available       Home Medications    Prior to Admission medications   Medication Sig Start Date End Date Taking? Authorizing Provider  Bepotastine Besilate (BEPREVE) 1.5 % SOLN Place 1 drop into both eyes 2 (two) times daily.   Yes Historical Provider, MD  cholecalciferol (VITAMIN D) 1000 UNITS tablet Take 1,000 Units by mouth daily.   Yes Historical Provider, MD  levocetirizine (XYZAL) 5  MG tablet Take 5 mg by mouth daily as needed for allergies.    Yes Historical Provider, MD  nebivolol (BYSTOLIC) 5 MG tablet Take 5 mg by mouth every evening. For HTN   Yes Historical Provider, MD  rosuvastatin (CRESTOR) 10 MG tablet Take 10 mg by mouth every evening. For hyperlipidemia   Yes Historical Provider, MD  triamterene-hydrochlorothiazide (MAXZIDE-25) 37.5-25 MG per tablet Take 1 tablet by mouth daily. For HTN   Yes Historical Provider, MD  zolpidem (AMBIEN) 5 MG tablet Take 5 mg by mouth at bedtime as needed for sleep.  03/18/16  Yes Historical Provider, MD  gabapentin (NEURONTIN) 100 MG capsule Take 1 capsule (100 mg total) by mouth 3 (three) times daily. Patient not taking: Reported on 06/28/2016 05/29/16    Ward Givens, NP  traMADol (ULTRAM) 50 MG tablet Take 1 tablet (50 mg total) by mouth every 6 (six) hours as needed. 06/28/16   Virgel Manifold, MD    Family History Family History  Problem Relation Age of Onset  . Heart attack Mother   . Alzheimer's disease Mother   . Diabetes Sister   . Hypertension Sister     Social History Social History  Substance Use Topics  . Smoking status: Passive Smoke Exposure - Never Smoker    Years: 42.00    Types: Cigarettes  . Smokeless tobacco: Never Used  . Alcohol use Yes     Comment: occasionally     Allergies   Ibuprofen; Latex; and Penicillins   Review of Systems Review of Systems   Physical Exam Updated Vital Signs BP 114/75 (BP Location: Right Arm)   Pulse 60   Temp 97.5 F (36.4 C) (Oral)   Resp 18   Ht 4' 11.5" (1.511 m)   Wt 154 lb (69.9 kg)   SpO2 98%   BMI 30.58 kg/m   Physical Exam  Constitutional: She appears well-developed and well-nourished. No distress.  HENT:  Head: Normocephalic and atraumatic.  Eyes: Conjunctivae are normal. Right eye exhibits no discharge. Left eye exhibits no discharge.  Neck: Neck supple.  Cardiovascular: Normal rate, regular rhythm and normal heart sounds.  Exam reveals no gallop and no friction rub.   No murmur heard. Pulmonary/Chest: Effort normal and breath sounds normal. No respiratory distress.  Abdominal: Soft. She exhibits no distension. There is no tenderness.  Musculoskeletal: She exhibits no edema or tenderness.  Lower extremities symmetric as compared to each other. No calf tenderness. Negative Homan's. No palpable cords.   Neurological: She is alert.  Skin: Skin is warm and dry.  Psychiatric: She has a normal mood and affect. Her behavior is normal. Thought content normal.  Nursing note and vitals reviewed.    ED Treatments / Results  Labs (all labs ordered are listed, but only abnormal results are displayed) Labs Reviewed - No data to display  EKG  EKG  Interpretation None       Radiology No results found.  Procedures Procedures (including critical care time)  Medications Ordered in ED Medications  traMADol (ULTRAM) tablet 50 mg (50 mg Oral Given 06/28/16 0854)     Initial Impression / Assessment and Plan / ED Course  I have reviewed the triage vital signs and the nursing notes.  Pertinent labs & imaging results that were available during my care of the patient were reviewed by me and considered in my medical decision making (see chart for details).  Clinical Course   65yF with atraumatic leg pain. NVI. [place] neg for dvt.  No signs of infection. Doubt emergent process. symptomatic tx. It has been determined that no acute conditions requiring further emergency intervention are present at this time. The patient has been advised of the diagnosis and plan. I reviewed any labs and imaging including any potential incidental findings. We have discussed signs and symptoms that warrant return to the ED and they are listed in the discharge instructions.    Final Clinical Impressions(s) / ED Diagnoses   Final diagnoses:  Pain of lower extremity, unspecified laterality    New Prescriptions Discharge Medication List as of 06/28/2016 12:21 PM    START taking these medications   Details  traMADol (ULTRAM) 50 MG tablet Take 1 tablet (50 mg total) by mouth every 6 (six) hours as needed., Starting Thu 06/28/2016, Print         Virgel Manifold, MD 07/04/16 1418

## 2016-07-10 DIAGNOSIS — I272 Other secondary pulmonary hypertension: Secondary | ICD-10-CM | POA: Diagnosis not present

## 2016-07-10 DIAGNOSIS — I10 Essential (primary) hypertension: Secondary | ICD-10-CM | POA: Diagnosis not present

## 2016-07-10 DIAGNOSIS — F17200 Nicotine dependence, unspecified, uncomplicated: Secondary | ICD-10-CM | POA: Diagnosis not present

## 2016-07-16 DIAGNOSIS — Z23 Encounter for immunization: Secondary | ICD-10-CM | POA: Diagnosis not present

## 2016-07-16 DIAGNOSIS — G500 Trigeminal neuralgia: Secondary | ICD-10-CM | POA: Diagnosis not present

## 2016-07-16 DIAGNOSIS — Z79899 Other long term (current) drug therapy: Secondary | ICD-10-CM | POA: Diagnosis not present

## 2016-07-16 DIAGNOSIS — Z87891 Personal history of nicotine dependence: Secondary | ICD-10-CM | POA: Diagnosis not present

## 2016-07-16 DIAGNOSIS — G2581 Restless legs syndrome: Secondary | ICD-10-CM | POA: Diagnosis not present

## 2016-08-14 DIAGNOSIS — Z0000 Encounter for general adult medical examination without abnormal findings: Secondary | ICD-10-CM | POA: Diagnosis not present

## 2016-08-14 DIAGNOSIS — E785 Hyperlipidemia, unspecified: Secondary | ICD-10-CM | POA: Diagnosis not present

## 2016-08-14 DIAGNOSIS — I10 Essential (primary) hypertension: Secondary | ICD-10-CM | POA: Diagnosis not present

## 2016-08-14 DIAGNOSIS — G500 Trigeminal neuralgia: Secondary | ICD-10-CM | POA: Diagnosis not present

## 2016-08-14 DIAGNOSIS — E559 Vitamin D deficiency, unspecified: Secondary | ICD-10-CM | POA: Diagnosis not present

## 2016-08-14 DIAGNOSIS — R7309 Other abnormal glucose: Secondary | ICD-10-CM | POA: Diagnosis not present

## 2016-09-03 DIAGNOSIS — M21611 Bunion of right foot: Secondary | ICD-10-CM | POA: Diagnosis not present

## 2016-09-03 DIAGNOSIS — Z6830 Body mass index (BMI) 30.0-30.9, adult: Secondary | ICD-10-CM | POA: Diagnosis not present

## 2016-09-03 DIAGNOSIS — M79671 Pain in right foot: Secondary | ICD-10-CM | POA: Diagnosis not present

## 2016-09-03 DIAGNOSIS — Z1389 Encounter for screening for other disorder: Secondary | ICD-10-CM | POA: Diagnosis not present

## 2016-09-10 ENCOUNTER — Ambulatory Visit: Payer: Medicare Other | Admitting: Podiatry

## 2016-09-18 ENCOUNTER — Encounter: Payer: Self-pay | Admitting: Podiatry

## 2016-09-18 ENCOUNTER — Ambulatory Visit (INDEPENDENT_AMBULATORY_CARE_PROVIDER_SITE_OTHER): Payer: Medicare Other | Admitting: Podiatry

## 2016-09-18 VITALS — BP 122/73 | HR 73 | Ht 59.5 in | Wt 151.0 lb

## 2016-09-18 DIAGNOSIS — M21619 Bunion of unspecified foot: Secondary | ICD-10-CM

## 2016-09-18 DIAGNOSIS — J019 Acute sinusitis, unspecified: Secondary | ICD-10-CM | POA: Diagnosis not present

## 2016-09-18 DIAGNOSIS — M216X2 Other acquired deformities of left foot: Secondary | ICD-10-CM

## 2016-09-18 DIAGNOSIS — M216X1 Other acquired deformities of right foot: Secondary | ICD-10-CM | POA: Diagnosis not present

## 2016-09-18 DIAGNOSIS — M21969 Unspecified acquired deformity of unspecified lower leg: Secondary | ICD-10-CM

## 2016-09-18 DIAGNOSIS — J309 Allergic rhinitis, unspecified: Secondary | ICD-10-CM | POA: Diagnosis not present

## 2016-09-18 DIAGNOSIS — M659 Synovitis and tenosynovitis, unspecified: Secondary | ICD-10-CM

## 2016-09-18 NOTE — Progress Notes (Signed)
Retired. Both hip replaces in 2007 and 2010. Right shoulder surgery in November 2016. SUBJECTIVE: 65 y.o. year old female presents complaining of foot pain that comes and goes since July 2017 R>L.  Does exercise, water aerobics, silver sneakers etc 3 x week.  Retired and not on feet much.  REVIEW OF SYSTEMS: A comprehensive review of systems was negative except for: Both hip surgery in 2007 and 2010. Right shoulder surgery in 2016.   OBJECTIVE: DERMATOLOGIC EXAMINATION: No abnormal findings.   VASCULAR EXAMINATION OF LOWER LIMBS: All pedal pulses are palpable with normal pulsation.  Capillary Filling times within 3 seconds in all digits.  No edema or erythema noted. Temperature gradient from tibial crest to dorsum of foot is within normal bilateral.  NEUROLOGIC EXAMINATION OF THE LOWER LIMBS: All epicritic and tactile sensations grossly intact.  MUSCULOSKELETAL EXAMINATION: Positive for hypermobile first metatarsal bilateral. Hallux valgus with bunion deformity R>L.  STJ hyperpronation bilateral.  Pain at midtarsal joint next to the first ray right.  ASSESSMENT: 1. Tenosynovitis right. 2. STJ hyperpronation bilateral. 3. First ray hypermobile bilateral. 4. 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  PLAN: Reviewed clinical findings and available treatment options. Metatarsal binder dispensed for right foot. Ok to take Advil as needed. Need custom orthotics. Reviewed proper shoe gear.  Return for orthotics.

## 2016-09-18 NOTE — Patient Instructions (Signed)
Seen for pain in right foot. Noted of weak first metatarsal bone allowing abnormal weight shifting to lateral column. May benefit from proper shoe gear, well supported lace up tennis shoes with custom orthotics. Use metatarsal binder as needed. Will call insurance on Orthotic coverage.

## 2016-11-22 ENCOUNTER — Other Ambulatory Visit: Payer: Self-pay | Admitting: Internal Medicine

## 2016-11-22 DIAGNOSIS — Z1231 Encounter for screening mammogram for malignant neoplasm of breast: Secondary | ICD-10-CM

## 2016-11-28 ENCOUNTER — Encounter: Payer: Self-pay | Admitting: Neurology

## 2016-11-28 ENCOUNTER — Ambulatory Visit (INDEPENDENT_AMBULATORY_CARE_PROVIDER_SITE_OTHER): Payer: Medicare Other | Admitting: Neurology

## 2016-11-28 VITALS — BP 117/73 | HR 89 | Resp 20 | Ht 59.0 in | Wt 152.0 lb

## 2016-11-28 DIAGNOSIS — G500 Trigeminal neuralgia: Secondary | ICD-10-CM

## 2016-11-28 MED ORDER — GABAPENTIN 100 MG PO CAPS: 100.0000 mg | ORAL_CAPSULE | Freq: Three times a day (TID) | ORAL | 3 refills | Status: DC

## 2016-11-28 NOTE — Progress Notes (Signed)
Reason for visit: Trigeminal neuralgia  [name] is an 66 y.o. female  History of present illness:  [name] is a 66 year old right-handed black female with a history of trigeminal neuralgia affecting the right V2 distribution. The patient is on gabapentin in very low dose taking 100 mg 3 times daily, she has done well with this dose. She is tolerating the medication well. She does have twinges of pain that may occur and are usually associated with excessive chewing or brushing her teeth. She does not seem to have any activation of pain with drinking hot or cold liquids. Talking does not initiate pain. The patient denies any other significant medical issues that have come up since last seen. She is not having any side effects on the current dose of medication.  Past Medical History:  Diagnosis Date  . Anxiety    takes Xanax prn  . Arthritis   . Diverticulosis   . H/O blood transfusion reaction 2010   hives  . H/O migraine    last 101yrs ago  . Headache(784.0)   . Heart murmur    takes Bystolic nightly  . History of blood clots 43yrs   was on Lovenox injections and Coumadin(was only on that for short period of time)  . History of shingles 3-79yrs ago  . Hx of seasonal allergies    takes Levocetirizine prn  . Hyperlipidemia    takes Crestor nightly  . Hypertension    takes Maxzide daily  . Insomnia    takes Ambien prn  . Joint pain   . Joint swelling   . Trigeminal neuralgia of right side of face 03/26/2016   V2 distribution    Past Surgical History:  Procedure Laterality Date  . ABDOMINAL HYSTERECTOMY    . BREAST REDUCTION SURGERY  1987  . CHOLECYSTECTOMY    . COLONOSCOPY    . ESOPHAGOGASTRODUODENOSCOPY    . EYE SURGERY Right    eye lash removed  . JOINT REPLACEMENT     bilateral hip rt in 2007 and lt in 2010  . NM MYOCAR PERF WALL MOTION  11/04/2008   Normal  . SHOULDER ARTHROSCOPY WITH DISTAL CLAVICLE RESECTION Right 09/21/2015   Procedure: RIGHT SHOULDER  ARTHROSCOPY WITH DISTAL CLAVICLE EXCISION DEBRIDE LABRAL TEAR ACROMIOPLASTY ;  Surgeon: Frederik Pear, MD;  Location: [facility];  Service: Orthopedics;  Laterality: Right;  . TEE WITHOUT CARDIOVERSION N/A 04/12/2015   Procedure: TRANSESOPHAGEAL ECHOCARDIOGRAM (TEE)/BUBBLE STUDY;  Surgeon: Adrian Prows, MD;  Location: [place];  Service: Cardiovascular;  Laterality: N/A;  . TOTAL HIP REVISION  04/14/2012   Procedure: TOTAL HIP REVISION;  Surgeon: Kerin Salen, MD;  Location: [facility];  Service: Orthopedics;  Laterality: Right;  right total hip revision  . TOTAL HIP REVISION Left 11/30/2013   Procedure: TOTAL HIP REVISION;  Surgeon: Kerin Salen, MD;  Location: [facility];  Service: Orthopedics;  Laterality: Left;    Family History  Problem Relation Age of Onset  . Heart attack Mother   . Alzheimer's disease Mother   . Diabetes Sister   . Hypertension Sister     Social history:  reports that she is a non-smoker but has been exposed to tobacco smoke. She has been exposed to tobacco smoke for the past 42.00 years. She has never used smokeless tobacco. She reports that she drinks alcohol. She reports that she does not use drugs.    Allergies  Allergen Reactions  . Ibuprofen Hives and Swelling  .  Latex Itching  . Penicillins Hives and Swelling    Has patient had a PCN reaction causing immediate rash, facial/tongue/throat swelling, SOB or lightheadedness with hypotension: yes Has patient had a PCN reaction causing severe rash involving mucus membranes or skin necrosis: no Has patient had a PCN reaction that required hospitalization: no Has patient had a PCN reaction occurring within the last 10 years: no If all of the above answers are "NO", then may proceed with Cephalosporin use.     Medications:  Prior to Admission medications   Medication Sig Start Date End Date Taking? Authorizing Provider  Bepotastine Besilate (BEPREVE) 1.5 % SOLN Place 1 drop into both eyes 2 (two) times  daily.   Yes Historical Provider, MD  cholecalciferol (VITAMIN D) 1000 UNITS tablet Take 1,000 Units by mouth daily.   Yes Historical Provider, MD  gabapentin (NEURONTIN) 100 MG capsule Take 1 capsule (100 mg total) by mouth 3 (three) times daily. 11/28/16  Yes Kathrynn Ducking, MD  levocetirizine (XYZAL) 5 MG tablet Take 5 mg by mouth daily as needed for allergies.    Yes Historical Provider, MD  nebivolol (BYSTOLIC) 5 MG tablet Take 5 mg by mouth every evening. For HTN   Yes Historical Provider, MD  rosuvastatin (CRESTOR) 10 MG tablet Take 10 mg by mouth every evening. For hyperlipidemia   Yes Historical Provider, MD  traMADol (ULTRAM) 50 MG tablet Take 1 tablet (50 mg total) by mouth every 6 (six) hours as needed. 06/28/16  Yes Virgel Manifold, MD  triamterene-hydrochlorothiazide (MAXZIDE-25) 37.5-25 MG per tablet Take 1 tablet by mouth daily. For HTN   Yes Historical Provider, MD  zolpidem (AMBIEN) 5 MG tablet Take 5 mg by mouth at bedtime as needed for sleep.  03/18/16  Yes Historical Provider, MD    ROS:  Out of a complete 14 system review of symptoms, the patient complains only of the following symptoms, and all other reviewed systems are negative.  Face pain  Blood pressure 117/73, pulse 89, resp. rate 20, height 4\' 11"  (1.499 m), weight 152 lb (68.9 kg).  Physical Exam  General: The patient is alert and cooperative at the time of the examination.  Skin: No significant peripheral edema is noted.   Neurologic Exam  Mental status: The patient is alert and oriented x 3 at the time of the examination. The patient has apparent normal recent and remote memory, with an apparently normal attention span and concentration ability.   Cranial nerves: Facial symmetry is present. Speech is normal, no aphasia or dysarthria is noted. Extraocular movements are full. Visual fields are full.  Motor: The patient has good strength in all 4 extremities.  Sensory examination: Soft touch sensation is  symmetric on the face, arms, and legs.  Coordination: The patient has good finger-nose-finger and heel-to-shin bilaterally.  Gait and station: The patient has a normal gait. Tandem gait is normal. Romberg is negative. No drift is seen.  Reflexes: Deep tendon reflexes are symmetric.   Assessment/Plan:  1. Right V2 distribution trigeminal neuralgia  The patient is doing well at this time. She does not wish to go up on the dose of the gabapentin, a prescription was sent in for her and she will follow-up in one year. She will call if she is having problems and we can see her sooner if needed.   Jill Alexanders MD 11/28/2016 7:56 AM  [facility] [address] [place], [place] [address]  Phone [phone] Fax [phone]

## 2016-11-29 ENCOUNTER — Ambulatory Visit: Payer: Medicare Other | Admitting: Neurology

## 2016-12-12 DIAGNOSIS — J029 Acute pharyngitis, unspecified: Secondary | ICD-10-CM | POA: Diagnosis not present

## 2016-12-12 DIAGNOSIS — J00 Acute nasopharyngitis [common cold]: Secondary | ICD-10-CM | POA: Diagnosis not present

## 2016-12-20 ENCOUNTER — Encounter (HOSPITAL_COMMUNITY): Payer: Self-pay

## 2016-12-20 ENCOUNTER — Emergency Department (HOSPITAL_COMMUNITY): Payer: Medicare Other

## 2016-12-20 ENCOUNTER — Emergency Department (HOSPITAL_COMMUNITY)
Admission: EM | Admit: 2016-12-20 | Discharge: 2016-12-21 | Disposition: A | Discharge status: Home / Self Care | Payer: Medicare Other | Attending: Emergency Medicine | Admitting: Emergency Medicine

## 2016-12-20 DIAGNOSIS — R079 Chest pain, unspecified: Secondary | ICD-10-CM

## 2016-12-20 DIAGNOSIS — F419 Anxiety disorder, unspecified: Secondary | ICD-10-CM

## 2016-12-20 DIAGNOSIS — I10 Essential (primary) hypertension: Secondary | ICD-10-CM | POA: Diagnosis not present

## 2016-12-20 DIAGNOSIS — F17210 Nicotine dependence, cigarettes, uncomplicated: Secondary | ICD-10-CM | POA: Insufficient documentation

## 2016-12-20 DIAGNOSIS — Z79899 Other long term (current) drug therapy: Secondary | ICD-10-CM | POA: Diagnosis not present

## 2016-12-20 DIAGNOSIS — Z96643 Presence of artificial hip joint, bilateral: Secondary | ICD-10-CM | POA: Insufficient documentation

## 2016-12-20 DIAGNOSIS — R0789 Other chest pain: Secondary | ICD-10-CM | POA: Insufficient documentation

## 2016-12-20 DIAGNOSIS — Z91010 Allergy to peanuts: Secondary | ICD-10-CM | POA: Diagnosis not present

## 2016-12-20 LAB — BASIC METABOLIC PANEL
Anion gap: 15 (ref 5–15)
BUN: 14 mg/dL (ref 6–20)
CHLORIDE: 101 mmol/L (ref 101–111)
CO2: 25 mmol/L (ref 22–32)
CREATININE: 0.79 mg/dL (ref 0.44–1.00)
Calcium: 9.5 mg/dL (ref 8.9–10.3)
GFR calc Af Amer: >60 mL/min (ref >60)
GFR calc non Af Amer: >60 mL/min (ref >60)
GLUCOSE: 91 mg/dL (ref 65–99)
POTASSIUM: 3.8 mmol/L (ref 3.5–5.1)
SODIUM: 141 mmol/L (ref 135–145)

## 2016-12-20 LAB — CBC
HEMATOCRIT: 39.2 % (ref 36.0–46.0)
Hemoglobin: 12.9 g/dL (ref 12.0–15.0)
MCH: 29 pg (ref 26.0–34.0)
MCHC: 32.9 g/dL (ref 30.0–36.0)
MCV: 88.1 fL (ref 78.0–100.0)
PLATELETS: 240 10*3/uL (ref 150–400)
RBC: 4.45 MIL/uL (ref 3.87–5.11)
RDW: 14.6 % (ref 11.5–15.5)
WBC: 5.3 10*3/uL (ref 4.0–10.5)

## 2016-12-20 LAB — I-STAT TROPONIN, ED: Troponin i, poc: 0 ng/mL (ref 0.00–0.08)

## 2016-12-20 MED ORDER — LORAZEPAM 1 MG PO TABS
1.0000 mg | ORAL_TABLET | Freq: Once | ORAL | Status: AC
  Administered 2016-12-21: 1 mg via ORAL
  Filled 2016-12-20: qty 1

## 2016-12-20 MED ORDER — ASPIRIN 81 MG PO CHEW
243.0000 mg | CHEWABLE_TABLET | Freq: Once | ORAL | Status: AC
  Administered 2016-12-21: 243 mg via ORAL
  Filled 2016-12-20: qty 3

## 2016-12-20 NOTE — ED Triage Notes (Signed)
Pt reports chest pain that began around 1700. She denies cardiac hx other than heart murmur. Pt states she has had some nausea but denies SOB. Took 1 baby ASA at home

## 2016-12-20 NOTE — ED Provider Notes (Signed)
[facility] DEPT Provider Note   CSN: [id number] Arrival date & time: 12/20/16  1913  By signing my name below, I, Oleh Genin, attest that this documentation has been prepared under the direction and in the presence of Delora Fuel, MD. Electronically Signed: Oleh Genin, Scribe. 12/20/16. 11:19 PM.   History   Chief Complaint Chief Complaint  Patient presents with  . Chest Pain    HPI [name] is a 65 y.o. female with history of HTN, HLD, and a "heart murmur" who presents to the ED for evaluation of chest pain. This patient states that at 1730 this evening she developed substernal chest pain which was associated with nausea and diaphoresis. She does state that prior to the onset of her symptoms she was in a particularly stressful situation with a friend. Pain worsened in triage. Currently 7/10 in severity. She denies any dyspnea or vomiting. Her mother did have an MI at approximately 41 years of age. She denies ever experiencing chest pain of this character before. She took an 81mg  aspirin pre-arrival.  The history is provided by the patient. No language interpreter was used.    Past Medical History:  Diagnosis Date  . Anxiety    takes Xanax prn  . Arthritis   . Diverticulosis   . H/O blood transfusion reaction 2010   hives  . H/O migraine    last 64yrs ago  . Headache(784.0)   . Heart murmur    takes Bystolic nightly  . History of blood clots 64yrs   was on Lovenox injections and Coumadin(was only on that for short period of time)  . History of shingles 3-24yrs ago  . Hx of seasonal allergies    takes Levocetirizine prn  . Hyperlipidemia    takes Crestor nightly  . Hypertension    takes Maxzide daily  . Insomnia    takes Ambien prn  . Joint pain   . Joint swelling   . Trigeminal neuralgia of right side of face 03/26/2016   V2 distribution    Patient Active Problem List   Diagnosis Date Noted  . Trigeminal neuralgia of right side of face 03/26/2016   . Hyperlipidemia 12/04/2013  . Hypertension 12/04/2013  . Constipation 12/04/2013  . Insomnia 12/04/2013  . Acute blood loss anemia 12/04/2013  . Left hip pain 12/04/2013  . 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 Pain due to total hip replacement ([place]) 04/16/2012    Past Surgical History:  Procedure Laterality Date  . ABDOMINAL HYSTERECTOMY    . BREAST REDUCTION SURGERY  1987  . CHOLECYSTECTOMY    . COLONOSCOPY    . ESOPHAGOGASTRODUODENOSCOPY    . EYE SURGERY Right    eye lash removed  . JOINT REPLACEMENT     bilateral hip rt in 2007 and lt in 2010  . NM MYOCAR PERF WALL MOTION  11/04/2008   Normal  . SHOULDER ARTHROSCOPY WITH DISTAL CLAVICLE RESECTION Right 09/21/2015   Procedure: RIGHT SHOULDER ARTHROSCOPY WITH DISTAL CLAVICLE EXCISION DEBRIDE LABRAL TEAR ACROMIOPLASTY ;  Surgeon: Frederik Pear, MD;  Location: [facility];  Service: Orthopedics;  Laterality: Right;  . TEE WITHOUT CARDIOVERSION N/A 04/12/2015   Procedure: TRANSESOPHAGEAL ECHOCARDIOGRAM (TEE)/BUBBLE STUDY;  Surgeon: Adrian Prows, MD;  Location: [facility];  Service: Cardiovascular;  Laterality: N/A;  . TOTAL HIP REVISION  04/14/2012   Procedure: TOTAL HIP REVISION;  Surgeon: Kerin Salen, MD;  Location: [facility];  Service: Orthopedics;  Laterality: Right;  right total hip revision  .  TOTAL HIP REVISION Left 11/30/2013   Procedure: TOTAL HIP REVISION;  Surgeon: Kerin Salen, MD;  Location: [place];  Service: Orthopedics;  Laterality: Left;    OB History    No data available       Home Medications    Prior to Admission medications   Medication Sig Start Date End Date Taking? Authorizing Provider  Bepotastine Besilate (BEPREVE) 1.5 % SOLN Place 1 drop into both eyes 2 (two) times daily.    Historical Provider, MD  cholecalciferol (VITAMIN D) 1000 UNITS tablet Take 1,000 Units by mouth daily.    Historical Provider, MD  gabapentin (NEURONTIN) 100 MG capsule Take 1 capsule (100 mg total) by mouth 3  (three) times daily. 11/28/16   Kathrynn Ducking, MD  levocetirizine (XYZAL) 5 MG tablet Take 5 mg by mouth daily as needed for allergies.     Historical Provider, MD  nebivolol (BYSTOLIC) 5 MG tablet Take 5 mg by mouth every evening. For HTN    Historical Provider, MD  rosuvastatin (CRESTOR) 10 MG tablet Take 10 mg by mouth every evening. For hyperlipidemia    Historical Provider, MD  traMADol (ULTRAM) 50 MG tablet Take 1 tablet (50 mg total) by mouth every 6 (six) hours as needed. 06/28/16   Virgel Manifold, MD  triamterene-hydrochlorothiazide (MAXZIDE-25) 37.5-25 MG per tablet Take 1 tablet by mouth daily. For HTN    Historical Provider, MD  zolpidem (AMBIEN) 5 MG tablet Take 5 mg by mouth at bedtime as needed for sleep.  03/18/16   Historical Provider, MD    Family History Family History  Problem Relation Age of Onset  . Heart attack Mother   . Alzheimer's disease Mother   . Diabetes Sister   . Hypertension Sister     Social History Social History  Substance Use Topics  . Smoking status: Passive Smoke Exposure - Never Smoker    Years: 42.00    Types: Cigarettes  . Smokeless tobacco: Never Used  . Alcohol use Yes     Comment: occasionally     Allergies   Ibuprofen; Latex; and Penicillins   Review of Systems Review of Systems  Respiratory: Negative for shortness of breath.   Cardiovascular: Positive for chest pain.       Diaphoresis  Gastrointestinal: Positive for nausea. Negative for vomiting.  All other systems reviewed and are negative.    Physical Exam Updated Vital Signs BP 133/82 (BP Location: Left Arm)   Pulse 87   Temp 98.7 F (37.1 C) (Oral)   Resp (!) 118   SpO2 100%   Physical Exam  Constitutional: She is oriented to person, place, and time. She appears well-developed and well-nourished.  HENT:  Head: Normocephalic and atraumatic.  Eyes: EOM are normal. Pupils are equal, round, and reactive to light.  Neck: Normal range of motion. Neck supple. No JVD  present.  Cardiovascular: Normal rate, regular rhythm and normal heart sounds.   No murmur heard. Pulmonary/Chest: Effort normal and breath sounds normal. She has no wheezes. She has no rales.  Localized tenderness to the lower L sternal border.   Abdominal: Soft. Bowel sounds are normal. She exhibits no distension and no mass. There is no tenderness.  Musculoskeletal: Normal range of motion. She exhibits no edema.  Lymphadenopathy:    She has no cervical adenopathy.  Neurological: She is alert and oriented to person, place, and time. No cranial nerve deficit. She exhibits normal muscle tone. Coordination normal.  Skin: Skin is warm and  dry. No rash noted.  Psychiatric: She has a normal mood and affect. Her behavior is normal. Judgment and thought content normal.  Nursing note and vitals reviewed.    ED Treatments / Results  DIAGNOSTIC STUDIES: Oxygen Saturation is 100 percent on room air which is normal by my interpretation.    COORDINATION OF CARE: 11:55 PM Discussed treatment plan with pt at bedside and pt agreed to plan.  Labs (all labs ordered are listed, but only abnormal results are displayed) Labs Reviewed  BASIC METABOLIC PANEL  CBC  I-STAT [place], ED  I-STAT [place], ED    EKG  EKG Interpretation  Date/Time:  Thursday December 20 2016 19:20:56 EST Ventricular Rate:  88 PR Interval:  124 QRS Duration: 68 QT Interval:  368 QTC Calculation: 445 R Axis:   37 Text Interpretation:  Normal sinus rhythm Possible Left atrial enlargement Low voltage QRS T wave abnormality, consider inferior ischemia T wave abnormality, consider anterior ischemia Abnormal ECG When compared with ECG of 03/30/2014, No significant change was found Confirmed by [facility]  MD, Lin Hackmann ([id number]) on 12/20/2016 11:43:36 PM       Radiology Dg Chest 2 View  Result Date: 12/20/2016 CLINICAL DATA:  Constant chest pain EXAM: CHEST  2 VIEW COMPARISON:  03/30/2014 FINDINGS: Heart and mediastinal contours are  within normal limits. No focal opacities or effusions. No acute bony abnormality. IMPRESSION: No active cardiopulmonary disease. Electronically Signed   By: Rolm Baptise M.D.   On: 12/20/2016 19:40    Procedures Procedures (including critical care time)  Medications Ordered in ED Medications  aspirin chewable tablet 243 mg (243 mg Oral Given 12/21/16 0017)  LORazepam (ATIVAN) tablet 1 mg (1 mg Oral Given 12/21/16 0017)     Initial Impression / Assessment and Plan / ED Course  I have reviewed the triage vital signs and the nursing notes.  Pertinent labs & imaging results that were available during my care of the patient were reviewed by me and considered in my medical decision making (see chart for details).  Chest discomfort. This came on following patient observing someone pass out and hit head and need to be transported to the hospital. I suspect anxiety is a large component. ECG is reassuring and initial troponin is normal. Old records are reviewed, and she has a prior ED visit for chest wall pain. She is given a dose of lorazepam. Repeat troponin is also normal and she is feeling much better following the razor Pam. Patient is given reassurance and is referred back to PCP for follow-up.  Final Clinical Impressions(s) / ED Diagnoses   Final diagnoses:  Chest pain, unspecified type  Chest wall tenderness  Anxiety    New Prescriptions New Prescriptions   No medications on file   I personally performed the services described in this documentation, which was scribed in my presence. The recorded information has been reviewed and is accurate.      Delora Fuel, MD [phone]

## 2016-12-21 DIAGNOSIS — R0789 Other chest pain: Secondary | ICD-10-CM | POA: Diagnosis not present

## 2016-12-21 LAB — I-STAT TROPONIN, ED: TROPONIN I, POC: 0 ng/mL (ref 0.00–0.08)

## 2016-12-21 NOTE — Discharge Instructions (Signed)
Return if pain is getting worse, or if you develop any new symptoms. You may treat pain with ice applied locally, or with acetaminophen, or with tramadol.

## 2016-12-21 NOTE — ED Notes (Signed)
Alert, NAD, calm, interactive, resps e/u, speaking in clear complete sentences, no dyspnea noted, skin W&D, VSS, admits to L chest pain and some nausea, also reports cough, (denies: sob, dizziness or visual changes).

## 2016-12-25 ENCOUNTER — Ambulatory Visit
Admission: RE | Admit: 2016-12-25 | Discharge: 2016-12-25 | Disposition: A | Discharge status: Home / Self Care | Payer: Medicare Other | Source: Ambulatory Visit | Attending: Internal Medicine | Admitting: Internal Medicine

## 2016-12-25 DIAGNOSIS — Z1231 Encounter for screening mammogram for malignant neoplasm of breast: Secondary | ICD-10-CM

## 2016-12-26 DIAGNOSIS — Z6830 Body mass index (BMI) 30.0-30.9, adult: Secondary | ICD-10-CM | POA: Diagnosis not present

## 2016-12-26 DIAGNOSIS — R079 Chest pain, unspecified: Secondary | ICD-10-CM | POA: Diagnosis not present

## 2016-12-26 DIAGNOSIS — Z09 Encounter for follow-up examination after completed treatment for conditions other than malignant neoplasm: Secondary | ICD-10-CM | POA: Diagnosis not present

## 2016-12-26 DIAGNOSIS — I10 Essential (primary) hypertension: Secondary | ICD-10-CM | POA: Diagnosis not present

## 2017-01-03 DIAGNOSIS — M7062 Trochanteric bursitis, left hip: Secondary | ICD-10-CM | POA: Diagnosis not present

## 2017-02-06 DIAGNOSIS — R252 Cramp and spasm: Secondary | ICD-10-CM | POA: Diagnosis not present

## 2017-02-06 DIAGNOSIS — I10 Essential (primary) hypertension: Secondary | ICD-10-CM | POA: Diagnosis not present

## 2017-04-02 DIAGNOSIS — K625 Hemorrhage of anus and rectum: Secondary | ICD-10-CM | POA: Diagnosis not present

## 2017-04-02 DIAGNOSIS — K5730 Diverticulosis of large intestine without perforation or abscess without bleeding: Secondary | ICD-10-CM | POA: Diagnosis not present

## 2017-04-02 DIAGNOSIS — Z86010 Personal history of colonic polyps: Secondary | ICD-10-CM | POA: Diagnosis not present

## 2017-04-02 DIAGNOSIS — Z1211 Encounter for screening for malignant neoplasm of colon: Secondary | ICD-10-CM | POA: Diagnosis not present

## 2017-04-08 DIAGNOSIS — Z1211 Encounter for screening for malignant neoplasm of colon: Secondary | ICD-10-CM | POA: Diagnosis not present

## 2017-04-08 DIAGNOSIS — K5730 Diverticulosis of large intestine without perforation or abscess without bleeding: Secondary | ICD-10-CM | POA: Diagnosis not present

## 2017-04-08 DIAGNOSIS — K635 Polyp of colon: Secondary | ICD-10-CM | POA: Diagnosis not present

## 2017-04-08 DIAGNOSIS — Z86010 Personal history of colonic polyps: Secondary | ICD-10-CM | POA: Diagnosis not present

## 2017-04-08 DIAGNOSIS — D122 Benign neoplasm of ascending colon: Secondary | ICD-10-CM | POA: Diagnosis not present

## 2017-05-02 DIAGNOSIS — D2361 Other benign neoplasm of skin of right upper limb, including shoulder: Secondary | ICD-10-CM | POA: Diagnosis not present

## 2017-05-02 DIAGNOSIS — L821 Other seborrheic keratosis: Secondary | ICD-10-CM | POA: Diagnosis not present

## 2017-05-02 DIAGNOSIS — L820 Inflamed seborrheic keratosis: Secondary | ICD-10-CM | POA: Diagnosis not present

## 2017-05-10 ENCOUNTER — Other Ambulatory Visit: Payer: Self-pay

## 2017-05-13 ENCOUNTER — Encounter (HOSPITAL_COMMUNITY): Payer: Self-pay | Admitting: Emergency Medicine

## 2017-05-13 ENCOUNTER — Emergency Department (HOSPITAL_COMMUNITY)
Admission: EM | Admit: 2017-05-13 | Discharge: 2017-05-13 | Disposition: A | Discharge status: Home / Self Care | Payer: Medicare Other | Attending: Emergency Medicine | Admitting: Emergency Medicine

## 2017-05-13 ENCOUNTER — Emergency Department (HOSPITAL_COMMUNITY): Payer: Medicare Other

## 2017-05-13 DIAGNOSIS — Z79899 Other long term (current) drug therapy: Secondary | ICD-10-CM | POA: Insufficient documentation

## 2017-05-13 DIAGNOSIS — R079 Chest pain, unspecified: Secondary | ICD-10-CM

## 2017-05-13 DIAGNOSIS — Z7722 Contact with and (suspected) exposure to environmental tobacco smoke (acute) (chronic): Secondary | ICD-10-CM | POA: Diagnosis not present

## 2017-05-13 DIAGNOSIS — Z96643 Presence of artificial hip joint, bilateral: Secondary | ICD-10-CM | POA: Diagnosis not present

## 2017-05-13 DIAGNOSIS — Z91040 Latex allergy status: Secondary | ICD-10-CM | POA: Diagnosis not present

## 2017-05-13 DIAGNOSIS — R0789 Other chest pain: Secondary | ICD-10-CM | POA: Diagnosis not present

## 2017-05-13 DIAGNOSIS — I10 Essential (primary) hypertension: Secondary | ICD-10-CM | POA: Diagnosis not present

## 2017-05-13 LAB — CBC
HCT: 42 % (ref 36.0–46.0)
Hemoglobin: 14.2 g/dL (ref 12.0–15.0)
MCH: 29.9 pg (ref 26.0–34.0)
MCHC: 33.8 g/dL (ref 30.0–36.0)
MCV: 88.4 fL (ref 78.0–100.0)
PLATELETS: 200 10*3/uL (ref 150–400)
RBC: 4.75 MIL/uL (ref 3.87–5.11)
RDW: 14.7 % (ref 11.5–15.5)
WBC: 6.1 10*3/uL (ref 4.0–10.5)

## 2017-05-13 LAB — BASIC METABOLIC PANEL
Anion gap: 9 (ref 5–15)
BUN: 20 mg/dL (ref 6–20)
CO2: 26 mmol/L (ref 22–32)
CREATININE: 0.74 mg/dL (ref 0.44–1.00)
Calcium: 9.2 mg/dL (ref 8.9–10.3)
Chloride: 103 mmol/L (ref 101–111)
GFR calc Af Amer: >60 mL/min (ref >60)
Glucose, Bld: 94 mg/dL (ref 65–99)
Potassium: 3.8 mmol/L (ref 3.5–5.1)
Sodium: 138 mmol/L (ref 135–145)

## 2017-05-13 LAB — POCT I-STAT TROPONIN I: TROPONIN I, POC: 0 ng/mL (ref 0.00–0.08)

## 2017-05-13 MED ORDER — ACETAMINOPHEN 500 MG PO TABS: 1000.0000 mg | ORAL_TABLET | Freq: Once | ORAL | Status: DC

## 2017-05-13 NOTE — ED Notes (Signed)
Pt verbalized understanding of discharge instructions and denies any further questions at this time.   

## 2017-05-13 NOTE — ED Triage Notes (Signed)
Pt c/o intermittent, sharp left lateral chest pain radiating to left arm and nausea onset this morning on waking. Self-treated with 2 baby aspirins without relief. Not related to activity or movement. No dizziness, SOB, blurred vision.

## 2017-05-13 NOTE — Discharge Instructions (Signed)
It was our pleasure to provide your ER care today - we hope that you feel better.  Follow up with your cardiologist in the next 1-2 weeks - call office to arrange appointment.  You may take acetaminophen as need for pain.  Return to ER if worse, new symptoms, high fevers, trouble breathing, persistent/recurrent chest pain, other concern.

## 2017-05-13 NOTE — ED Provider Notes (Addendum)
[facility] DEPT Provider Note   CSN: [id number] Arrival date & time: 05/13/17  1009     History   Chief Complaint Chief Complaint  Patient presents with  . Chest Pain    HPI [name] is a 66 y.o. female.  Patient c/o left lower chest pain for the past day. Occurs at rest. No exertional cp or discomfort. No unusual doe or fatigue.  Constant, dull, non radiating. No associated sob, nv or diaphoresis. Pain is not pleuritic. No leg pain or swelling. No hx cad. No hx dvt or pe. Denies cough or uri c/o. No fever or chills. No trauma to area. Normal appetite. No nv. No dysuria or gu c/o. Hx prior cholecystectomy.    The history is provided by the patient.  Chest Pain   Pertinent negatives include no abdominal pain, no back pain, no cough, no fever, no headaches, no shortness of breath and no vomiting.    Past Medical History:  Diagnosis Date  . Anxiety    takes Xanax prn  . Arthritis   . Diverticulosis   . H/O blood transfusion reaction 2010   hives  . H/O migraine    last 65yrs ago  . Headache(784.0)   . Heart murmur    takes Bystolic nightly  . History of blood clots 68yrs   was on Lovenox injections and Coumadin(was only on that for short period of time)  . History of shingles 3-68yrs ago  . Hx of seasonal allergies    takes Levocetirizine prn  . Hyperlipidemia    takes Crestor nightly  . Hypertension    takes Maxzide daily  . Insomnia    takes Ambien prn  . Joint pain   . Joint swelling   . Trigeminal neuralgia of right side of face 03/26/2016   V2 distribution    Patient Active Problem List   Diagnosis Date Noted  . Trigeminal neuralgia of right side of face 03/26/2016  . Hyperlipidemia 12/04/2013  . Hypertension 12/04/2013  . Constipation 12/04/2013  . Insomnia 12/04/2013  . Acute blood loss anemia 12/04/2013  . Left hip pain 12/04/2013  . 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 Pain due to total hip replacement ([place]) 04/16/2012    Past  Surgical History:  Procedure Laterality Date  . ABDOMINAL HYSTERECTOMY    . BREAST BIOPSY  05/03/2006   [place]  . BREAST REDUCTION SURGERY  1987  . CHOLECYSTECTOMY    . COLONOSCOPY    . ESOPHAGOGASTRODUODENOSCOPY    . EYE SURGERY Right    eye lash removed  . JOINT REPLACEMENT     bilateral hip rt in 2007 and lt in 2010  . NM MYOCAR PERF WALL MOTION  11/04/2008   Normal  . REDUCTION MAMMAPLASTY Bilateral 1988  . REDUCTION MAMMAPLASTY    . SHOULDER ARTHROSCOPY WITH DISTAL CLAVICLE RESECTION Right 09/21/2015   Procedure: RIGHT SHOULDER ARTHROSCOPY WITH DISTAL CLAVICLE EXCISION DEBRIDE LABRAL TEAR ACROMIOPLASTY ;  Surgeon: Frederik Pear, MD;  Location: [facility];  Service: Orthopedics;  Laterality: Right;  . TEE WITHOUT CARDIOVERSION N/A 04/12/2015   Procedure: TRANSESOPHAGEAL ECHOCARDIOGRAM (TEE)/BUBBLE STUDY;  Surgeon: Adrian Prows, MD;  Location: [facility];  Service: Cardiovascular;  Laterality: N/A;  . TOTAL HIP REVISION  04/14/2012   Procedure: TOTAL HIP REVISION;  Surgeon: Kerin Salen, MD;  Location: [facility];  Service: Orthopedics;  Laterality: Right;  right total hip revision  . TOTAL HIP REVISION Left 11/30/2013   Procedure: TOTAL HIP REVISION;  Surgeon: Kerin Salen, MD;  Location: [facility];  Service: Orthopedics;  Laterality: Left;    OB History    No data available       Home Medications    Prior to Admission medications   Medication Sig Start Date End Date Taking? Authorizing Provider  Bepotastine Besilate (BEPREVE) 1.5 % SOLN Place 1 drop into both eyes 2 (two) times daily as needed (allergies).     [provider]  cholecalciferol (VITAMIN D) 1000 UNITS tablet Take 1,000 Units by mouth daily.    [provider]  gabapentin (NEURONTIN) 100 MG capsule Take 1 capsule (100 mg total) by mouth 3 (three) times daily. Patient taking differently: Take 100 mg by mouth 2 (two) times daily.  11/28/16   Kathrynn Ducking, MD  levocetirizine (XYZAL) 5 MG tablet  Take 5 mg by mouth daily as needed for allergies.     [provider]  nebivolol (BYSTOLIC) 5 MG tablet Take 5 mg by mouth every evening. For HTN    [provider]  rosuvastatin (CRESTOR) 10 MG tablet Take 10 mg by mouth every evening. For hyperlipidemia    [provider]  traMADol (ULTRAM) 50 MG tablet Take 1 tablet (50 mg total) by mouth every 6 (six) hours as needed. Patient taking differently: Take 50 mg by mouth every 6 (six) hours as needed for moderate pain.  06/28/16   Virgel Manifold, MD  triamterene-hydrochlorothiazide (MAXZIDE-25) 37.5-25 MG per tablet Take 1 tablet by mouth daily. For HTN    [provider]  zolpidem (AMBIEN) 5 MG tablet Take 5 mg by mouth at bedtime as needed for sleep.  03/18/16   [provider]    Family History Family History  Problem Relation Age of Onset  . Heart attack Mother   . Alzheimer's disease Mother   . Diabetes Sister   . Hypertension Sister     Social History Social History  Substance Use Topics  . Smoking status: Passive Smoke Exposure - Never Smoker    Years: 42.00    Types: Cigarettes  . Smokeless tobacco: Never Used  . Alcohol use Yes     Comment: occasionally     Allergies   Ibuprofen; Latex; and Penicillins   Review of Systems Review of Systems  Constitutional: Negative for chills and fever.  HENT: Negative for sore throat.   Eyes: Negative for redness.  Respiratory: Negative for cough and shortness of breath.   Cardiovascular: Positive for chest pain.  Gastrointestinal: Negative for abdominal pain and vomiting.  Endocrine: Negative for polyuria.  Genitourinary: Negative for dysuria and flank pain.  Musculoskeletal: Negative for back pain and neck pain.  Skin: Negative for rash.  Neurological: Negative for headaches.  Hematological: Does not bruise/bleed easily.  Psychiatric/Behavioral: Negative for confusion.     Physical Exam Updated Vital Signs BP 129/73 (BP  Location: Right Arm)   Pulse 86   Temp 98.3 F (36.8 C) (Oral)   Resp 17   Ht 1.511 m (4' 11.5")   Wt 69.9 kg (154 lb)   SpO2 95%   BMI 30.58 kg/m   Physical Exam  Constitutional: She appears well-developed and well-nourished. No distress.  HENT:  Mouth/Throat: Oropharynx is clear and moist.  Eyes: Conjunctivae are normal. No scleral icterus.  Neck: Neck supple. No tracheal deviation present.  Cardiovascular: Normal rate, regular rhythm, normal heart sounds and intact distal pulses.   Pulmonary/Chest: Effort normal and breath sounds normal. No respiratory distress. She exhibits tenderness.  Abdominal: Soft. Normal appearance and bowel sounds are normal. She exhibits no distension and no mass. There is no tenderness. There is no rebound and no guarding. No hernia.  Genitourinary:  Genitourinary Comments: No cva tenderness  Musculoskeletal: She exhibits no edema or tenderness.  Neurological: She is alert.  Skin: Skin is warm and dry. No rash noted. She is not diaphoretic.  No shingles/rash to area of pain  Psychiatric: She has a normal mood and affect.  Nursing note and vitals reviewed.    ED Treatments / Results  Labs (all labs ordered are listed, but only abnormal results are displayed) Results for orders placed or performed during the hospital encounter of 57/84/69  Basic metabolic panel  Result Value Ref Range   Sodium 138 135 - 145 mmol/L   Potassium 3.8 3.5 - 5.1 mmol/L   Chloride 103 101 - 111 mmol/L   CO2 26 22 - 32 mmol/L   Glucose, Bld 94 65 - 99 mg/dL   BUN 20 6 - 20 mg/dL   Creatinine, Ser 0.74 0.44 - 1.00 mg/dL   Calcium 9.2 8.9 - 10.3 mg/dL   GFR calc non Af Amer >60 >60 mL/min   GFR calc Af Amer >60 >60 mL/min   Anion gap 9 5 - 15  CBC  Result Value Ref Range   WBC 6.1 4.0 - 10.5 K/uL   RBC 4.75 3.87 - 5.11 MIL/uL   Hemoglobin 14.2 12.0 - 15.0 g/dL   HCT 42.0 36.0 - 46.0 %   MCV 88.4 78.0 - 100.0 fL   MCH 29.9 26.0 - 34.0 pg   MCHC 33.8 30.0 - 36.0  g/dL   RDW 14.7 11.5 - 15.5 %   Platelets 200 150 - 400 K/uL  POCT i-Stat troponin I  Result Value Ref Range   Troponin i, poc 0.00 0.00 - 0.08 ng/mL   Comment 3           Dg Chest 2 View  Result Date: 05/13/2017 CLINICAL DATA:  Chest pain EXAM: CHEST  2 VIEW COMPARISON:  12/20/2016 FINDINGS: Heart and mediastinal contours are within normal limits. No focal opacities or effusions. No acute bony abnormality. IMPRESSION: No active cardiopulmonary disease. Electronically Signed   By: Rolm Baptise M.D.   On: 05/13/2017 11:10    EKG  EKG Interpretation  Date/Time:  Monday May 13 2017 10:26:26 EDT Ventricular Rate:  80 PR Interval:    QRS Duration: 84 QT Interval:  371 QTC Calculation: 428 R Axis:   29 Text Interpretation:  Sinus rhythm Borderline T abnormalities, diffuse leads No significant change since last tracing Confirmed by Merrily Pew [phone]) on 05/13/2017 10:29:51 AM Also confirmed by Merrily Pew [phone]), editor Drema Pry [phone])  on 05/13/2017 11:09:33 AM       Radiology Dg Chest 2 View  Result Date: 05/13/2017 CLINICAL DATA:  Chest pain EXAM: CHEST  2 VIEW COMPARISON:  12/20/2016 FINDINGS: Heart and mediastinal contours are within normal limits. No focal opacities or effusions. No acute bony abnormality. IMPRESSION: No active cardiopulmonary disease. Electronically Signed   By: Rolm Baptise M.D.   On: 05/13/2017 11:10    Procedures Procedures (including critical care time)  Medications Ordered in ED Medications  acetaminophen (TYLENOL) tablet 1,000 mg (not administered)     Initial Impression / Assessment and Plan / ED Course  I have reviewed the triage vital signs and the nursing notes.  Pertinent labs & imaging results that were available during my care of the patient  were reviewed by me and considered in my medical decision making (see chart for details).  After symptoms constant for past day, trop 0 - symptoms do not appear c/w acs, and do  appear more c/w musculoskeletal pain.    Reviewed nursing notes and prior charts for additional history.   2 prior cardiac caths without signif cad.   Acetaminophen po.  Pt currently appears stable for d/c.   rec close outpt f/u.  Return precautions.     Final Clinical Impressions(s) / ED Diagnoses   Final diagnoses:  None    New Prescriptions New Prescriptions   No medications on file        Lajean Saver, MD 05/13/17 [phone]

## 2017-05-21 DIAGNOSIS — H3342 Traction detachment of retina, left eye: Secondary | ICD-10-CM | POA: Diagnosis not present

## 2017-05-21 DIAGNOSIS — H40013 Open angle with borderline findings, low risk, bilateral: Secondary | ICD-10-CM | POA: Diagnosis not present

## 2017-05-21 DIAGNOSIS — H43822 Vitreomacular adhesion, left eye: Secondary | ICD-10-CM | POA: Diagnosis not present

## 2017-05-30 DIAGNOSIS — M79671 Pain in right foot: Secondary | ICD-10-CM | POA: Diagnosis not present

## 2017-07-04 DIAGNOSIS — I270 Primary pulmonary hypertension: Secondary | ICD-10-CM | POA: Diagnosis not present

## 2017-07-09 DIAGNOSIS — H35352 Cystoid macular degeneration, left eye: Secondary | ICD-10-CM | POA: Diagnosis not present

## 2017-07-09 DIAGNOSIS — H43822 Vitreomacular adhesion, left eye: Secondary | ICD-10-CM | POA: Diagnosis not present

## 2017-07-09 DIAGNOSIS — H2511 Age-related nuclear cataract, right eye: Secondary | ICD-10-CM | POA: Diagnosis not present

## 2017-07-09 DIAGNOSIS — H43821 Vitreomacular adhesion, right eye: Secondary | ICD-10-CM | POA: Diagnosis not present

## 2017-07-16 ENCOUNTER — Encounter (HOSPITAL_COMMUNITY): Payer: Self-pay | Admitting: Emergency Medicine

## 2017-07-16 ENCOUNTER — Emergency Department (HOSPITAL_COMMUNITY): Payer: Medicare Other

## 2017-07-16 ENCOUNTER — Emergency Department (HOSPITAL_COMMUNITY)
Admission: EM | Admit: 2017-07-16 | Discharge: 2017-07-16 | Disposition: A | Discharge status: Home / Self Care | Payer: Medicare Other | Attending: Emergency Medicine | Admitting: Emergency Medicine

## 2017-07-16 DIAGNOSIS — Z91040 Latex allergy status: Secondary | ICD-10-CM | POA: Insufficient documentation

## 2017-07-16 DIAGNOSIS — Z96643 Presence of artificial hip joint, bilateral: Secondary | ICD-10-CM | POA: Diagnosis not present

## 2017-07-16 DIAGNOSIS — R197 Diarrhea, unspecified: Secondary | ICD-10-CM

## 2017-07-16 DIAGNOSIS — Z7722 Contact with and (suspected) exposure to environmental tobacco smoke (acute) (chronic): Secondary | ICD-10-CM | POA: Diagnosis not present

## 2017-07-16 DIAGNOSIS — K5730 Diverticulosis of large intestine without perforation or abscess without bleeding: Secondary | ICD-10-CM | POA: Diagnosis not present

## 2017-07-16 DIAGNOSIS — Z79899 Other long term (current) drug therapy: Secondary | ICD-10-CM | POA: Diagnosis not present

## 2017-07-16 DIAGNOSIS — K529 Noninfective gastroenteritis and colitis, unspecified: Secondary | ICD-10-CM | POA: Diagnosis not present

## 2017-07-16 DIAGNOSIS — I10 Essential (primary) hypertension: Secondary | ICD-10-CM | POA: Insufficient documentation

## 2017-07-16 DIAGNOSIS — R1031 Right lower quadrant pain: Secondary | ICD-10-CM

## 2017-07-16 DIAGNOSIS — R112 Nausea with vomiting, unspecified: Secondary | ICD-10-CM

## 2017-07-16 LAB — COMPREHENSIVE METABOLIC PANEL
ALT: 32 U/L (ref 14–54)
ANION GAP: 12 (ref 5–15)
AST: 54 U/L — AB (ref 15–41)
Albumin: 4 g/dL (ref 3.5–5.0)
Alkaline Phosphatase: 64 U/L (ref 38–126)
BILIRUBIN TOTAL: 0.6 mg/dL (ref 0.3–1.2)
BUN: 16 mg/dL (ref 6–20)
CHLORIDE: 103 mmol/L (ref 101–111)
CO2: 26 mmol/L (ref 22–32)
Calcium: 9.3 mg/dL (ref 8.9–10.3)
Creatinine, Ser: 0.86 mg/dL (ref 0.44–1.00)
GFR calc Af Amer: >60 mL/min (ref >60)
Glucose, Bld: 109 mg/dL — ABNORMAL HIGH (ref 65–99)
POTASSIUM: 3.8 mmol/L (ref 3.5–5.1)
Sodium: 141 mmol/L (ref 135–145)
TOTAL PROTEIN: 7.3 g/dL (ref 6.5–8.1)

## 2017-07-16 LAB — URINALYSIS, ROUTINE W REFLEX MICROSCOPIC
Bilirubin Urine: NEGATIVE
GLUCOSE, UA: NEGATIVE mg/dL
Hgb urine dipstick: NEGATIVE
KETONES UR: NEGATIVE mg/dL
LEUKOCYTES UA: NEGATIVE
NITRITE: NEGATIVE
PH: 6 (ref 5.0–8.0)
Protein, ur: NEGATIVE mg/dL
SPECIFIC GRAVITY, URINE: 1.02 (ref 1.005–1.030)

## 2017-07-16 LAB — CBC
HEMATOCRIT: 42 % (ref 36.0–46.0)
HEMOGLOBIN: 14.1 g/dL (ref 12.0–15.0)
MCH: 29.4 pg (ref 26.0–34.0)
MCHC: 33.6 g/dL (ref 30.0–36.0)
MCV: 87.7 fL (ref 78.0–100.0)
Platelets: 206 10*3/uL (ref 150–400)
RBC: 4.79 MIL/uL (ref 3.87–5.11)
RDW: 15.5 % (ref 11.5–15.5)
WBC: 5.4 10*3/uL (ref 4.0–10.5)

## 2017-07-16 LAB — LIPASE, BLOOD: LIPASE: 30 U/L (ref 11–51)

## 2017-07-16 MED ORDER — IOPAMIDOL (ISOVUE-300) INJECTION 61%
INTRAVENOUS | Status: AC
  Administered 2017-07-16: 100 mL via INTRAVENOUS
  Filled 2017-07-16: qty 100

## 2017-07-16 MED ORDER — IOPAMIDOL (ISOVUE-300) INJECTION 61%
100.0000 mL | Freq: Once | INTRAVENOUS | Status: AC | PRN
  Administered 2017-07-16: 100 mL via INTRAVENOUS

## 2017-07-16 MED ORDER — ONDANSETRON HCL 4 MG/2ML IJ SOLN
4.0000 mg | Freq: Once | INTRAMUSCULAR | Status: AC
  Administered 2017-07-16: 4 mg via INTRAVENOUS
  Filled 2017-07-16: qty 2

## 2017-07-16 MED ORDER — ONDANSETRON 4 MG PO TBDP: 4.0000 mg | ORAL_TABLET | Freq: Once | ORAL | Status: DC | PRN

## 2017-07-16 MED ORDER — ONDANSETRON 4 MG PO TBDP: 4.0000 mg | ORAL_TABLET | Freq: Three times a day (TID) | ORAL | 0 refills | Status: DC | PRN

## 2017-07-16 MED ORDER — DICYCLOMINE HCL 20 MG PO TABS: 20.0000 mg | ORAL_TABLET | Freq: Two times a day (BID) | ORAL | 0 refills | Status: DC | PRN

## 2017-07-16 MED ORDER — SODIUM CHLORIDE 0.9 % IV BOLUS (SEPSIS)
1000.0000 mL | Freq: Once | INTRAVENOUS | Status: AC
  Administered 2017-07-16: 1000 mL via INTRAVENOUS

## 2017-07-16 MED ORDER — DICYCLOMINE HCL 10 MG/ML IM SOLN
20.0000 mg | Freq: Once | INTRAMUSCULAR | Status: AC
  Administered 2017-07-16: 20 mg via INTRAMUSCULAR
  Filled 2017-07-16: qty 2

## 2017-07-16 NOTE — ED Triage Notes (Signed)
Patient c/o abd pain with n.v.d that started yesterday. Patient reports that she took imodium yesterday which helped with diarrhea but already had three episodes this am.

## 2017-07-16 NOTE — ED Provider Notes (Signed)
[facility] DEPT Provider Note   CSN: [id number] Arrival date & time: 07/16/17  1108     History   Chief Complaint Chief Complaint  Patient presents with  . Abdominal Pain  . Diarrhea  . Emesis    HPI [name] is a 66 y.o. female.  HPI 3 days of abdominal pain, yesterday began to have nausea, vomiting and diarrhea Pain is RUQ and some in RLQ, aching, constant pain. 7/10. Heating pad helped some. Not necessarily worse with eating but hasn't been eating much.  Did have some chicken noodle soup.  Had gallbladder removed in past, colonoscopy one mo ago due to some rectal bleeding saw Dr. Collene Mares, had polyps, called her to see if she could get an appointment and the earliest was Thursday.    Diarrhea 3-4 times per day, vomiting at same time 4 times daily, happening at the same time Had some red lobster lasagna, not sure if suspicious No other sick contacts No recent antibiotics  Low appetite. Slight temperature of 100.0 at home.  No urinary symptoms, no vaginal bleeding or discharge  Past Medical History:  Diagnosis Date  . Anxiety    takes Xanax prn  . Arthritis   . Diverticulosis   . H/O blood transfusion reaction 2010   hives  . H/O migraine    last 62yrs ago  . Headache(784.0)   . Heart murmur    takes Bystolic nightly  . History of blood clots 29yrs   was on Lovenox injections and Coumadin(was only on that for short period of time)  . History of shingles 3-75yrs ago  . Hx of seasonal allergies    takes Levocetirizine prn  . Hyperlipidemia    takes Crestor nightly  . Hypertension    takes Maxzide daily  . Insomnia    takes Ambien prn  . Joint pain   . Joint swelling   . Trigeminal neuralgia of right side of face 03/26/2016   V2 distribution    Patient Active Problem List   Diagnosis Date Noted  . Trigeminal neuralgia of right side of face 03/26/2016  . Hyperlipidemia 12/04/2013  . Hypertension 12/04/2013  . Constipation 12/04/2013  . Insomnia  12/04/2013  . Acute blood loss anemia 12/04/2013  . Left hip pain 12/04/2013  . 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 Pain due to total hip replacement ([place]) 04/16/2012    Past Surgical History:  Procedure Laterality Date  . ABDOMINAL HYSTERECTOMY    . BREAST BIOPSY  05/03/2006   [place]  . BREAST REDUCTION SURGERY  1987  . CHOLECYSTECTOMY    . COLONOSCOPY    . ESOPHAGOGASTRODUODENOSCOPY    . EYE SURGERY Right    eye lash removed  . JOINT REPLACEMENT     bilateral hip rt in 2007 and lt in 2010  . NM MYOCAR PERF WALL MOTION  11/04/2008   Normal  . REDUCTION MAMMAPLASTY Bilateral 1988  . REDUCTION MAMMAPLASTY    . SHOULDER ARTHROSCOPY WITH DISTAL CLAVICLE RESECTION Right 09/21/2015   Procedure: RIGHT SHOULDER ARTHROSCOPY WITH DISTAL CLAVICLE EXCISION DEBRIDE LABRAL TEAR ACROMIOPLASTY ;  Surgeon: Frederik Pear, MD;  Location: [facility];  Service: Orthopedics;  Laterality: Right;  . TEE WITHOUT CARDIOVERSION N/A 04/12/2015   Procedure: TRANSESOPHAGEAL ECHOCARDIOGRAM (TEE)/BUBBLE STUDY;  Surgeon: Adrian Prows, MD;  Location: [facility];  Service: Cardiovascular;  Laterality: N/A;  . TOTAL HIP REVISION  04/14/2012   Procedure: TOTAL HIP REVISION;  Surgeon: Kerin Salen, MD;  Location:  [facility];  Service: Orthopedics;  Laterality: Right;  right total hip revision  . TOTAL HIP REVISION Left 11/30/2013   Procedure: TOTAL HIP REVISION;  Surgeon: Kerin Salen, MD;  Location: [facility];  Service: Orthopedics;  Laterality: Left;    OB History    No data available       Home Medications    Prior to Admission medications   Medication Sig Start Date End Date Taking? Authorizing Provider  aspirin-sod bicarb-citric acid (ALKA-SELTZER) 325 MG TBEF tablet Take 325 mg by mouth every 6 (six) hours as needed (indigestion).   Yes [provider]  Bepotastine Besilate (BEPREVE) 1.5 % SOLN Place 1 drop into both eyes 2 (two) times daily as needed (allergies).    Yes [provider]  cholecalciferol (VITAMIN D) 1000 UNITS tablet Take 1,000 Units by mouth daily.   Yes [provider]  levocetirizine (XYZAL) 5 MG tablet Take 5 mg by mouth daily as needed for allergies.    Yes [provider]  loperamide (IMODIUM A-D) 2 MG tablet Take 2 mg by mouth 4 (four) times daily as needed for diarrhea or loose stools.   Yes [provider]  nebivolol (BYSTOLIC) 5 MG tablet Take 5 mg by mouth every evening. For HTN   Yes [provider]  rosuvastatin (CRESTOR) 10 MG tablet Take 10 mg by mouth every evening. For hyperlipidemia   Yes [provider]  traMADol (ULTRAM) 50 MG tablet Take 1 tablet (50 mg total) by mouth every 6 (six) hours as needed. Patient taking differently: Take 50 mg by mouth every 6 (six) hours as needed for moderate pain.  06/28/16  Yes Virgel Manifold, MD  triamterene-hydrochlorothiazide (MAXZIDE-25) 37.5-25 MG per tablet Take 1 tablet by mouth daily. For HTN   Yes [provider]  zolpidem (AMBIEN) 5 MG tablet Take 5 mg by mouth at bedtime as needed for sleep.  03/18/16  Yes [provider]  dicyclomine (BENTYL) 20 MG tablet Take 1 tablet (20 mg total) by mouth 2 (two) times daily as needed for spasms (abdominal pain). 07/16/17   Gareth Morgan, MD  gabapentin (NEURONTIN) 100 MG capsule Take 1 capsule (100 mg total) by mouth 3 (three) times daily. Patient not taking: Reported on 07/16/2017 11/28/16   Kathrynn Ducking, MD  ondansetron (ZOFRAN ODT) 4 MG disintegrating tablet Take 1 tablet (4 mg total) by mouth every 8 (eight) hours as needed for nausea or vomiting. 07/16/17   Gareth Morgan, MD    Family History Family History  Problem Relation Age of Onset  . Heart attack Mother   . Alzheimer's disease Mother   . Diabetes Sister   . Hypertension Sister     Social History Social History  Substance Use Topics  . Smoking status: Passive Smoke Exposure - Never Smoker    Years: 42.00    Types:  Cigarettes  . Smokeless tobacco: Never Used  . Alcohol use Yes     Comment: occasionally     Allergies   Ibuprofen; Latex; and Penicillins   Review of Systems Review of Systems  Constitutional: Positive for appetite change, fatigue and fever.  HENT: Negative for congestion.   Eyes: Negative for visual disturbance.  Respiratory: Negative for cough and shortness of breath.   Cardiovascular: Negative for chest pain.  Gastrointestinal: Positive for abdominal pain and vomiting.  Genitourinary: Negative for dysuria.  Musculoskeletal: Negative for back pain.  Skin: Negative for rash.  Neurological: Positive for light-headedness (while in  waiting room).     Physical Exam Updated Vital Signs BP 98/77 (BP Location: Left Arm)   Pulse 69   Temp 98 F (36.7 C) (Oral)   Resp 18   SpO2 100%   Physical Exam  Constitutional: She is oriented to person, place, and time. She appears well-developed and well-nourished. No distress.  HENT:  Head: Normocephalic and atraumatic.  Eyes: Conjunctivae and EOM are normal.  Neck: Normal range of motion.  Cardiovascular: Normal rate, regular rhythm and intact distal pulses.  Exam reveals no gallop and no friction rub.   Murmur heard. Pulmonary/Chest: Effort normal and breath sounds normal. No respiratory distress. She has no wheezes. She has no rales.  Abdominal: Soft. She exhibits no distension. There is tenderness (RLQ, mild RUQ). There is no guarding.  Musculoskeletal: She exhibits no edema or tenderness.  Neurological: She is alert and oriented to person, place, and time.  Skin: Skin is warm and dry. No rash noted. She is not diaphoretic. No erythema.  Nursing note and vitals reviewed.    ED Treatments / Results  Labs (all labs ordered are listed, but only abnormal results are displayed) Labs Reviewed  COMPREHENSIVE METABOLIC PANEL - Abnormal; Notable for the following:       Result Value   Glucose, Bld 109 (*)    AST 54 (*)    All  other components within normal limits  LIPASE, BLOOD  CBC  URINALYSIS, ROUTINE W REFLEX MICROSCOPIC    EKG  EKG Interpretation None       Radiology Ct Abdomen Pelvis W Contrast  Result Date: 07/16/2017 CLINICAL DATA:  Acute right lower quadrant abdominal pain. EXAM: CT ABDOMEN AND PELVIS WITH CONTRAST TECHNIQUE: Multidetector CT imaging of the abdomen and pelvis was performed using the standard protocol following bolus administration of intravenous contrast. CONTRAST:  100 mL of Isovue 300 intravenously. COMPARISON:  None. FINDINGS: Lower chest: No acute abnormality. Hepatobiliary: No focal liver abnormality is seen. Status post cholecystectomy. No biliary dilatation. Pancreas: Unremarkable. No pancreatic ductal dilatation or surrounding inflammatory changes. Spleen: Normal in size without focal abnormality. Adrenals/Urinary Tract: Adrenal glands and kidneys appear normal. No hydronephrosis or renal obstruction is noted. No definite renal or ureteral calculi are noted. Urinary bladder is not well visualized due to scatter artifact arising from bilateral hip arthroplasties. Stomach/Bowel: Stomach is within normal limits. Appendix appears normal. No evidence of bowel wall thickening, distention, or inflammatory changes. Diverticulosis of descending colon is noted without inflammation. Vascular/Lymphatic: Aortic atherosclerosis. No enlarged abdominal or pelvic lymph nodes. Reproductive: Not well-visualized due to scatter artifact arising from bilateral hip arthroplasties. Other: No abdominal wall hernia or abnormality. No abdominopelvic ascites. Musculoskeletal: Severe degenerative disc disease is noted at L3-4. No acute abnormality is noted. IMPRESSION: Aortic atherosclerosis. Diverticulosis of descending colon without inflammation. Normal appendix. No acute abnormality seen in the abdomen or pelvis. Electronically Signed   By: Marijo Conception, M.D.   On: 07/16/2017 16:25    Procedures Procedures  (including critical care time)  Medications Ordered in ED Medications  ondansetron (ZOFRAN-ODT) disintegrating tablet 4 mg (not administered)  sodium chloride 0.9 % bolus 1,000 mL (1,000 mLs Intravenous New Bag/Given 07/16/17 1611)  ondansetron (ZOFRAN) injection 4 mg (4 mg Intravenous Given 07/16/17 1611)  dicyclomine (BENTYL) injection 20 mg (20 mg Intramuscular Given 07/16/17 1530)  iopamidol (ISOVUE-300) 61 % injection 100 mL (100 mLs Intravenous Contrast Given 07/16/17 1553)     Initial Impression / Assessment and Plan / ED Course  I have  reviewed the triage vital signs and the nursing notes.  Pertinent labs & imaging results that were available during my care of the patient were reviewed by me and considered in my medical decision making (see chart for details).     66 year old female presents with concern for abdominal pain, nausea, vomiting and diarrhea. Labs show no sign of urinary tract infection, no sign of pancreatitis/hepatitis. No sign of significant dehydration.  CT abdomen done to evaluate for poss appendicitis shows no acute findings.   Given fluids, zofran, bentyl. Suspect most likely gastroenteritis. Patient to see GI physician on Thursday. Given rx for zofran, bentyl. Patient discharged in stable condition with understanding of reasons to return.   Final Clinical Impressions(s) / ED Diagnoses   Final diagnoses:  Gastroenteritis  Nausea vomiting and diarrhea  Right lower quadrant abdominal pain    New Prescriptions New Prescriptions   DICYCLOMINE (BENTYL) 20 MG TABLET    Take 1 tablet (20 mg total) by mouth 2 (two) times daily as needed for spasms (abdominal pain).   ONDANSETRON (ZOFRAN ODT) 4 MG DISINTEGRATING TABLET    Take 1 tablet (4 mg total) by mouth every 8 (eight) hours as needed for nausea or vomiting.     Gareth Morgan, MD 07/16/17 1659

## 2017-07-18 DIAGNOSIS — K5730 Diverticulosis of large intestine without perforation or abscess without bleeding: Secondary | ICD-10-CM | POA: Diagnosis not present

## 2017-07-18 DIAGNOSIS — K5732 Diverticulitis of large intestine without perforation or abscess without bleeding: Secondary | ICD-10-CM | POA: Diagnosis not present

## 2017-07-18 DIAGNOSIS — K7581 Nonalcoholic steatohepatitis (NASH): Secondary | ICD-10-CM | POA: Diagnosis not present

## 2017-07-18 DIAGNOSIS — Z86010 Personal history of colonic polyps: Secondary | ICD-10-CM | POA: Diagnosis not present

## 2017-07-24 DIAGNOSIS — R1011 Right upper quadrant pain: Secondary | ICD-10-CM | POA: Diagnosis not present

## 2017-07-24 DIAGNOSIS — R1031 Right lower quadrant pain: Secondary | ICD-10-CM | POA: Diagnosis not present

## 2017-07-26 DIAGNOSIS — R112 Nausea with vomiting, unspecified: Secondary | ICD-10-CM | POA: Diagnosis not present

## 2017-07-26 DIAGNOSIS — K5730 Diverticulosis of large intestine without perforation or abscess without bleeding: Secondary | ICD-10-CM | POA: Diagnosis not present

## 2017-07-26 DIAGNOSIS — I700 Atherosclerosis of aorta: Secondary | ICD-10-CM | POA: Diagnosis not present

## 2017-07-26 DIAGNOSIS — R1031 Right lower quadrant pain: Secondary | ICD-10-CM | POA: Diagnosis not present

## 2017-07-29 DIAGNOSIS — Z87891 Personal history of nicotine dependence: Secondary | ICD-10-CM | POA: Diagnosis not present

## 2017-07-29 DIAGNOSIS — I071 Rheumatic tricuspid insufficiency: Secondary | ICD-10-CM | POA: Diagnosis not present

## 2017-07-29 DIAGNOSIS — I10 Essential (primary) hypertension: Secondary | ICD-10-CM | POA: Diagnosis not present

## 2017-08-08 DIAGNOSIS — R112 Nausea with vomiting, unspecified: Secondary | ICD-10-CM | POA: Diagnosis not present

## 2017-08-08 DIAGNOSIS — R1031 Right lower quadrant pain: Secondary | ICD-10-CM | POA: Diagnosis not present

## 2017-08-19 DIAGNOSIS — R194 Change in bowel habit: Secondary | ICD-10-CM | POA: Diagnosis not present

## 2017-08-19 DIAGNOSIS — R110 Nausea: Secondary | ICD-10-CM | POA: Diagnosis not present

## 2017-08-22 DIAGNOSIS — M79672 Pain in left foot: Secondary | ICD-10-CM | POA: Diagnosis not present

## 2017-08-23 ENCOUNTER — Inpatient Hospital Stay (HOSPITAL_COMMUNITY)
Admission: EM | Admit: 2017-08-23 | Discharge: 2017-08-29 | DRG: 603 | Disposition: A | Discharge status: Home / Self Care | Payer: Medicare Other | Attending: Internal Medicine | Admitting: Internal Medicine

## 2017-08-23 ENCOUNTER — Emergency Department (HOSPITAL_COMMUNITY): Payer: Medicare Other

## 2017-08-23 ENCOUNTER — Encounter (HOSPITAL_COMMUNITY): Payer: Self-pay | Admitting: Emergency Medicine

## 2017-08-23 DIAGNOSIS — L03119 Cellulitis of unspecified part of limb: Secondary | ICD-10-CM

## 2017-08-23 DIAGNOSIS — Z8619 Personal history of other infectious and parasitic diseases: Secondary | ICD-10-CM

## 2017-08-23 DIAGNOSIS — L03116 Cellulitis of left lower limb: Secondary | ICD-10-CM | POA: Diagnosis not present

## 2017-08-23 DIAGNOSIS — Z86718 Personal history of other venous thrombosis and embolism: Secondary | ICD-10-CM

## 2017-08-23 DIAGNOSIS — Z91040 Latex allergy status: Secondary | ICD-10-CM

## 2017-08-23 DIAGNOSIS — E559 Vitamin D deficiency, unspecified: Secondary | ICD-10-CM | POA: Diagnosis present

## 2017-08-23 DIAGNOSIS — I10 Essential (primary) hypertension: Secondary | ICD-10-CM | POA: Diagnosis not present

## 2017-08-23 DIAGNOSIS — Z880 Allergy status to penicillin: Secondary | ICD-10-CM

## 2017-08-23 DIAGNOSIS — M79672 Pain in left foot: Secondary | ICD-10-CM | POA: Diagnosis not present

## 2017-08-23 DIAGNOSIS — Z888 Allergy status to other drugs, medicaments and biological substances status: Secondary | ICD-10-CM

## 2017-08-23 DIAGNOSIS — M1990 Unspecified osteoarthritis, unspecified site: Secondary | ICD-10-CM | POA: Diagnosis present

## 2017-08-23 DIAGNOSIS — Z886 Allergy status to analgesic agent status: Secondary | ICD-10-CM

## 2017-08-23 DIAGNOSIS — M7989 Other specified soft tissue disorders: Secondary | ICD-10-CM | POA: Diagnosis not present

## 2017-08-23 DIAGNOSIS — E876 Hypokalemia: Secondary | ICD-10-CM

## 2017-08-23 DIAGNOSIS — E785 Hyperlipidemia, unspecified: Secondary | ICD-10-CM | POA: Diagnosis not present

## 2017-08-23 DIAGNOSIS — Z23 Encounter for immunization: Secondary | ICD-10-CM | POA: Diagnosis not present

## 2017-08-23 DIAGNOSIS — M25475 Effusion, left foot: Secondary | ICD-10-CM | POA: Diagnosis present

## 2017-08-23 DIAGNOSIS — K5900 Constipation, unspecified: Secondary | ICD-10-CM | POA: Diagnosis present

## 2017-08-23 DIAGNOSIS — F419 Anxiety disorder, unspecified: Secondary | ICD-10-CM | POA: Diagnosis present

## 2017-08-23 DIAGNOSIS — J302 Other seasonal allergic rhinitis: Secondary | ICD-10-CM | POA: Diagnosis present

## 2017-08-23 DIAGNOSIS — K219 Gastro-esophageal reflux disease without esophagitis: Secondary | ICD-10-CM | POA: Diagnosis present

## 2017-08-23 DIAGNOSIS — Z96643 Presence of artificial hip joint, bilateral: Secondary | ICD-10-CM | POA: Diagnosis present

## 2017-08-23 DIAGNOSIS — R262 Difficulty in walking, not elsewhere classified: Secondary | ICD-10-CM | POA: Diagnosis present

## 2017-08-23 LAB — BASIC METABOLIC PANEL
ANION GAP: 11 (ref 5–15)
BUN: 13 mg/dL (ref 6–20)
CALCIUM: 9.1 mg/dL (ref 8.9–10.3)
CO2: 28 mmol/L (ref 22–32)
Chloride: 97 mmol/L — ABNORMAL LOW (ref 101–111)
Creatinine, Ser: 0.91 mg/dL (ref 0.44–1.00)
GFR calc Af Amer: >60 mL/min (ref >60)
GLUCOSE: 131 mg/dL — AB (ref 65–99)
Potassium: 3 mmol/L — ABNORMAL LOW (ref 3.5–5.1)
Sodium: 136 mmol/L (ref 135–145)

## 2017-08-23 LAB — CBC WITH DIFFERENTIAL/PLATELET
BASOS ABS: 0 10*3/uL (ref 0.0–0.1)
BASOS PCT: 0 %
EOS ABS: 0 10*3/uL (ref 0.0–0.7)
EOS PCT: 0 %
HCT: 37.8 % (ref 36.0–46.0)
Hemoglobin: 12.6 g/dL (ref 12.0–15.0)
Lymphocytes Relative: 30 %
Lymphs Abs: 2 10*3/uL (ref 0.7–4.0)
MCH: 29.9 pg (ref 26.0–34.0)
MCHC: 33.3 g/dL (ref 30.0–36.0)
MCV: 89.6 fL (ref 78.0–100.0)
MONO ABS: 0.8 10*3/uL (ref 0.1–1.0)
Monocytes Relative: 12 %
Neutro Abs: 3.9 10*3/uL (ref 1.7–7.7)
Neutrophils Relative %: 58 %
PLATELETS: 204 10*3/uL (ref 150–400)
RBC: 4.22 MIL/uL (ref 3.87–5.11)
RDW: 15.2 % (ref 11.5–15.5)
WBC: 6.8 10*3/uL (ref 4.0–10.5)

## 2017-08-23 LAB — MAGNESIUM: Magnesium: 2.1 mg/dL (ref 1.7–2.4)

## 2017-08-23 LAB — URIC ACID: URIC ACID, SERUM: 7.2 mg/dL — AB (ref 2.3–6.6)

## 2017-08-23 MED ORDER — SORBITOL 70 % SOLN
30.0000 mL | Freq: Every day | Status: DC | PRN
  Filled 2017-08-23: qty 30

## 2017-08-23 MED ORDER — CEFAZOLIN SODIUM-DEXTROSE 2-4 GM/100ML-% IV SOLN
2.0000 g | Freq: Three times a day (TID) | INTRAVENOUS | Status: DC
  Administered 2017-08-23 – 2017-08-25 (×5): 2 g via INTRAVENOUS
  Filled 2017-08-23 (×5): qty 100

## 2017-08-23 MED ORDER — NEBIVOLOL HCL 5 MG PO TABS
5.0000 mg | ORAL_TABLET | Freq: Every evening | ORAL | Status: DC
  Administered 2017-08-23 – 2017-08-25 (×3): 5 mg via ORAL
  Filled 2017-08-23 (×4): qty 1

## 2017-08-23 MED ORDER — ROSUVASTATIN CALCIUM 10 MG PO TABS
10.0000 mg | ORAL_TABLET | Freq: Every evening | ORAL | Status: DC
  Administered 2017-08-23 – 2017-08-27 (×5): 10 mg via ORAL
  Filled 2017-08-23 (×4): qty 1

## 2017-08-23 MED ORDER — ACETAMINOPHEN 325 MG PO TABS
650.0000 mg | ORAL_TABLET | Freq: Four times a day (QID) | ORAL | Status: DC | PRN
  Administered 2017-08-26: 650 mg via ORAL
  Filled 2017-08-23: qty 2

## 2017-08-23 MED ORDER — OXYCODONE HCL 5 MG PO TABS
5.0000 mg | ORAL_TABLET | ORAL | Status: DC | PRN
  Administered 2017-08-23 – 2017-08-29 (×26): 5 mg via ORAL
  Filled 2017-08-23 (×26): qty 1

## 2017-08-23 MED ORDER — VITAMIN D3 25 MCG (1000 UNIT) PO TABS
1000.0000 [IU] | ORAL_TABLET | Freq: Every day | ORAL | Status: DC
  Administered 2017-08-24 – 2017-08-29 (×6): 1000 [IU] via ORAL
  Filled 2017-08-23 (×6): qty 1

## 2017-08-23 MED ORDER — ENOXAPARIN SODIUM 40 MG/0.4ML ~~LOC~~ SOLN
40.0000 mg | SUBCUTANEOUS | Status: DC
  Administered 2017-08-23 – 2017-08-28 (×6): 40 mg via SUBCUTANEOUS
  Filled 2017-08-23 (×6): qty 0.4

## 2017-08-23 MED ORDER — MORPHINE SULFATE (PF) 2 MG/ML IV SOLN
2.0000 mg | INTRAVENOUS | Status: DC | PRN
  Administered 2017-08-23: 2 mg via INTRAVENOUS
  Filled 2017-08-23: qty 1

## 2017-08-23 MED ORDER — ONDANSETRON HCL 4 MG PO TABS: 4.0000 mg | ORAL_TABLET | Freq: Four times a day (QID) | ORAL | Status: DC | PRN

## 2017-08-23 MED ORDER — PANTOPRAZOLE SODIUM 40 MG PO TBEC
40.0000 mg | DELAYED_RELEASE_TABLET | Freq: Every day | ORAL | Status: DC
  Administered 2017-08-23 – 2017-08-29 (×7): 40 mg via ORAL
  Filled 2017-08-23 (×8): qty 1

## 2017-08-23 MED ORDER — OXYCODONE-ACETAMINOPHEN 5-325 MG PO TABS
1.0000 | ORAL_TABLET | Freq: Once | ORAL | Status: AC
  Administered 2017-08-23: 1 via ORAL
  Filled 2017-08-23: qty 1

## 2017-08-23 MED ORDER — ONDANSETRON 4 MG PO TBDP: 4.0000 mg | ORAL_TABLET | Freq: Three times a day (TID) | ORAL | Status: DC | PRN

## 2017-08-23 MED ORDER — SODIUM CHLORIDE 0.9% FLUSH
3.0000 mL | Freq: Two times a day (BID) | INTRAVENOUS | Status: DC
  Administered 2017-08-24 – 2017-08-28 (×3): 3 mL via INTRAVENOUS

## 2017-08-23 MED ORDER — SODIUM CHLORIDE 0.9 % IV SOLN
INTRAVENOUS | Status: DC
  Administered 2017-08-23 – 2017-08-24 (×2): via INTRAVENOUS

## 2017-08-23 MED ORDER — ONDANSETRON HCL 4 MG/2ML IJ SOLN: 4.0000 mg | Freq: Four times a day (QID) | INTRAMUSCULAR | Status: DC | PRN

## 2017-08-23 MED ORDER — POTASSIUM CHLORIDE CRYS ER 20 MEQ PO TBCR
40.0000 meq | EXTENDED_RELEASE_TABLET | Freq: Once | ORAL | Status: AC
  Administered 2017-08-23: 40 meq via ORAL
  Filled 2017-08-23: qty 2

## 2017-08-23 MED ORDER — SODIUM CHLORIDE 0.9 % IV BOLUS (SEPSIS)
1000.0000 mL | Freq: Once | INTRAVENOUS | Status: AC
  Administered 2017-08-23: 1000 mL via INTRAVENOUS

## 2017-08-23 MED ORDER — POLYETHYLENE GLYCOL 3350 17 G PO PACK: 17.0000 g | PACK | Freq: Every day | ORAL | Status: DC | PRN

## 2017-08-23 MED ORDER — TETANUS-DIPHTH-ACELL PERTUSSIS 5-2.5-18.5 LF-MCG/0.5 IM SUSP
0.5000 mL | Freq: Once | INTRAMUSCULAR | Status: AC
  Administered 2017-08-23: 0.5 mL via INTRAMUSCULAR
  Filled 2017-08-23: qty 0.5

## 2017-08-23 MED ORDER — ACETAMINOPHEN 650 MG RE SUPP: 650.0000 mg | Freq: Four times a day (QID) | RECTAL | Status: DC | PRN

## 2017-08-23 MED ORDER — SENNA 8.6 MG PO TABS
1.0000 | ORAL_TABLET | Freq: Two times a day (BID) | ORAL | Status: DC
  Administered 2017-08-23 – 2017-08-29 (×12): 8.6 mg via ORAL
  Filled 2017-08-23 (×11): qty 1

## 2017-08-23 MED ORDER — DICYCLOMINE HCL 20 MG PO TABS: 20.0000 mg | ORAL_TABLET | Freq: Two times a day (BID) | ORAL | Status: DC | PRN

## 2017-08-23 MED ORDER — CEFAZOLIN SODIUM-DEXTROSE 1-4 GM/50ML-% IV SOLN
1.0000 g | Freq: Once | INTRAVENOUS | Status: AC
  Administered 2017-08-23: 1 g via INTRAVENOUS
  Filled 2017-08-23: qty 50

## 2017-08-23 MED ORDER — ZOLPIDEM TARTRATE 5 MG PO TABS
5.0000 mg | ORAL_TABLET | Freq: Every evening | ORAL | Status: DC | PRN
  Administered 2017-08-23 – 2017-08-28 (×6): 5 mg via ORAL
  Filled 2017-08-23 (×6): qty 1

## 2017-08-23 NOTE — ED Provider Notes (Signed)
She with progressively worsening pain and redness at right foot onset 3 days ago.  Seen at [place] office today sent here for further evaluation.  Treated with Bactrim.  Redness and pain has worsened since treated with Bactrim.  She now has redness at the distal aspect of medial lower leg.  Denies nausea or vomiting.  Denies fever   Orlie Dakin, MD 08/23/17 1437

## 2017-08-23 NOTE — ED Triage Notes (Addendum)
Patient reports her left foot has been hurting and swelling onset of Monday. Saw her orthopedic yesterday and started on bactrim and celebrex PO. Patient reports no relief and was encouraged to come to the ER for IV antibiotics.  X-rays done at orthopedics yesterday.

## 2017-08-23 NOTE — H&P (Signed)
History and Physical    [name]:[id number] DOB: November 16, 1950 DOA: 08/23/2017  PCP: Glendale Chard, MD /orthopedics: Dr. Mayer Camel Patient coming from: Home  I have personally briefly reviewed patient's old medical records in [place]  Chief Complaint: Left foot pain  HPI: [name] is a 66 y.o. female with medical history significant for diverticulitis, hypertension, hyperlipidemia, anxiety, arthritis presented to the ED with 3 day history of left foot pain, swelling, warmth, tenderness to palpation. Patient denies any trauma to the foot. Patient states was seen on her orthopod's office, Dr. Dellia Nims office and assessed one day prior to admission and treated for a cellulitis. Patient was prescribed Celebrex as well as an antibiotic which she cannot remember. The ED patient was prescribed Bactrim at that time. Patient states took a dose of the Bactrim and noted that her left foot swelling, warmth, erythema, tenderness to palpation had worsened and presented for follow-up after [employer] office on the day of admission. Per patient she was seen at [facility] office who felt patient's cellulitis was worsening and required IV antibiotics and a such patient was sent to the emergency room for admission. Patient states unable to stand or ambulate on the foot due to pain. Patient denies any fevers, no chills, no nausea, no vomiting, no chest pain, no shortness of breath, no abdominal pain, no dysuria, no melena, no hematemesis, no hematochezia, no syncope, no focal neurological deficits.  ED Course: Patient seen in the ED CBC done was unremarkable. Basic metabolic profile had a potassium of 3.0 otherwise was within normal limits. Plain. The left foot done was negative for osteomyelitis, soft tissue air, negative for fracture. Patient was given a dose of IV Ancef. [facility] were called to admit the patient due to failed outpatient treatment, per ED physician.  Review of Systems:  As per HPI otherwise 10 point review of systems negative.   Past Medical History:  Diagnosis Date  . Anxiety    takes Xanax prn  . Arthritis   . Diverticulosis   . H/O blood transfusion reaction 2010   hives  . H/O migraine    last 65yrs ago  . Headache(784.0)   . Heart murmur    takes Bystolic nightly  . History of blood clots 4yrs   was on Lovenox injections and Coumadin(was only on that for short period of time)  . History of shingles 3-83yrs ago  . Hx of seasonal allergies    takes Levocetirizine prn  . Hyperlipidemia    takes Crestor nightly  . Hypertension    takes Maxzide daily  . Insomnia    takes Ambien prn  . Joint pain   . Joint swelling   . Trigeminal neuralgia of right side of face 03/26/2016   V2 distribution    Past Surgical History:  Procedure Laterality Date  . ABDOMINAL HYSTERECTOMY    . BREAST BIOPSY  05/03/2006   [place]  . BREAST REDUCTION SURGERY  1987  . CHOLECYSTECTOMY    . COLONOSCOPY    . ESOPHAGOGASTRODUODENOSCOPY    . EYE SURGERY Right    eye lash removed  . JOINT REPLACEMENT     bilateral hip rt in 2007 and lt in 2010  . NM MYOCAR PERF WALL MOTION  11/04/2008   Normal  . REDUCTION MAMMAPLASTY Bilateral 1988  . REDUCTION MAMMAPLASTY    . SHOULDER ARTHROSCOPY WITH DISTAL CLAVICLE RESECTION Right 09/21/2015   Procedure: RIGHT SHOULDER ARTHROSCOPY WITH DISTAL CLAVICLE EXCISION DEBRIDE LABRAL  TEAR ACROMIOPLASTY ;  Surgeon: Frederik Pear, MD;  Location: [place];  Service: Orthopedics;  Laterality: Right;  . TEE WITHOUT CARDIOVERSION N/A 04/12/2015   Procedure: TRANSESOPHAGEAL ECHOCARDIOGRAM (TEE)/BUBBLE STUDY;  Surgeon: Adrian Prows, MD;  Location: [place];  Service: Cardiovascular;  Laterality: N/A;  . TOTAL HIP REVISION  04/14/2012   Procedure: TOTAL HIP REVISION;  Surgeon: Kerin Salen, MD;  Location: [place];  Service: Orthopedics;  Laterality: Right;  right total hip revision  . TOTAL HIP REVISION Left 11/30/2013   Procedure:  TOTAL HIP REVISION;  Surgeon: Kerin Salen, MD;  Location: [place];  Service: Orthopedics;  Laterality: Left;     reports that she is a non-smoker but has been exposed to tobacco smoke. She has been exposed to tobacco smoke for the past 42.00 years. She has never used smokeless tobacco. She reports that she drinks alcohol. She reports that she does not use drugs.  Allergies  Allergen Reactions  . Ibuprofen Hives and Swelling  . Penicillins Hives and Swelling    Has patient had a PCN reaction causing immediate rash, facial/tongue/throat swelling, SOB or lightheadedness with hypotension: yes Has patient had a PCN reaction causing severe rash involving mucus membranes or skin necrosis: no Has patient had a PCN reaction that required hospitalization: no Has patient had a PCN reaction occurring within the last 10 years: no If all of the above answers are "NO", then may proceed with Cephalosporin use.   . Latex Itching    Family History  Problem Relation Age of Onset  . Heart attack Mother   . Alzheimer's disease Mother   . Diabetes Sister   . Hypertension Sister    Mother deceased age 48 from Alzheimer's dementia. Father unknown.  Prior to Admission medications   Medication Sig Start Date End Date Taking? Authorizing Provider  Bepotastine Besilate (BEPREVE) 1.5 % SOLN Place 1 drop into both eyes 2 (two) times daily as needed (allergies).    Yes [provider]  celecoxib (CELEBREX) 200 MG capsule Take 200 mg by mouth 2 (two) times daily. 08/22/17  Yes [provider]  cholecalciferol (VITAMIN D) 1000 UNITS tablet Take 1,000 Units by mouth daily.   Yes [provider]  DEXILANT 60 MG capsule Take 1 capsule by mouth daily. 08/19/17  Yes [provider]  nebivolol (BYSTOLIC) 5 MG tablet Take 5 mg by mouth every evening. For HTN   Yes [provider]  Phenylephrine-APAP-Guaifenesin (TYLENOL SINUS SEVERE) 5-325-200 MG TABS Take 2 tablets by mouth  every 6 (six) hours as needed (for cold).   Yes [provider]  rosuvastatin (CRESTOR) 10 MG tablet Take 10 mg by mouth every evening. For hyperlipidemia   Yes [provider]  traMADol (ULTRAM) 50 MG tablet Take 1 tablet (50 mg total) by mouth every 6 (six) hours as needed. Patient taking differently: Take 50 mg by mouth every 6 (six) hours as needed for moderate pain.  06/28/16  Yes Virgel Manifold, MD  triamterene-hydrochlorothiazide (DYAZIDE) 37.5-25 MG capsule Take 1 capsule by mouth daily. 08/09/17  Yes [provider]  zolpidem (AMBIEN) 5 MG tablet Take 5 mg by mouth at bedtime as needed for sleep.  03/18/16  Yes [provider]  dicyclomine (BENTYL) 20 MG tablet Take 1 tablet (20 mg total) by mouth 2 (two) times daily as needed for spasms (abdominal pain). Patient not taking: Reported on 08/23/2017 07/16/17   Gareth Morgan, MD  ondansetron (ZOFRAN ODT) 4 MG disintegrating  tablet Take 1 tablet (4 mg total) by mouth every 8 (eight) hours as needed for nausea or vomiting. Patient not taking: Reported on 08/23/2017 07/16/17   Gareth Morgan, MD    Physical Exam: Vitals:   08/23/17 1209 08/23/17 1213 08/23/17 1629  BP: 105/72  135/78  Pulse: 93  87  Resp: 18  18  Temp: 98.5 F (36.9 C)  98.4 F (36.9 C)  TempSrc: Oral  Oral  SpO2: 98%  100%  Weight:  69.9 kg (154 lb)   Height:  5' (1.524 m)     Constitutional: NAD, calm, comfortable Vitals:   08/23/17 1209 08/23/17 1213 08/23/17 1629  BP: 105/72  135/78  Pulse: 93  87  Resp: 18  18  Temp: 98.5 F (36.9 C)  98.4 F (36.9 C)  TempSrc: Oral  Oral  SpO2: 98%  100%  Weight:  69.9 kg (154 lb)   Height:  5' (1.524 m)    Eyes: PERRLA, lids and conjunctivae normal ENMT: Mucous membranes are moist. Posterior pharynx clear of any exudate or lesions.Normal dentition.  Neck: normal, supple, no masses, no thyromegaly Respiratory: clear to auscultation bilaterally, no wheezing, no crackles. Normal  respiratory effort. No accessory muscle use.  Cardiovascular: Regular rate and rhythm, no murmurs / rubs / gallops. No extremity edema. 2+ pedal pulses. No carotid bruits.  Abdomen: no tenderness, no masses palpated. No hepatosplenomegaly. Bowel sounds positive.  Musculoskeletal: no clubbing / cyanosis. No joint deformity upper and lower extremities. Good ROM, no contractures. Normal muscle tone. Left foot with some erythema, warmth, tenderness to palpation on the dorsal aspect of the foot. Skin: no rashes, lesions, ulcers. No induration. Neurologic: CN 2-12 grossly intact. Sensation intact, DTR normal. Strength 5/5 in all 4.  Psychiatric: Normal judgment and insight. Alert and oriented x 3. Normal mood.    Labs on Admission: I have personally reviewed following labs and imaging studies  CBC:  Recent Labs Lab 08/23/17 1556  WBC 6.8  NEUTROABS 3.9  HGB 12.6  HCT 37.8  MCV 89.6  PLT 768   Basic Metabolic Panel:  Recent Labs Lab 08/23/17 1556  NA 136  K 3.0*  CL 97*  CO2 28  GLUCOSE 131*  BUN 13  CREATININE 0.91  CALCIUM 9.1  MG 2.1   GFR: Estimated Creatinine Clearance: 52.4 mL/min (by C-G formula based on SCr of 0.91 mg/dL). Liver Function Tests: No results for input(s): AST, ALT, ALKPHOS, BILITOT, PROT, ALBUMIN in the last 168 hours. No results for input(s): LIPASE, AMYLASE in the last 168 hours. No results for input(s): AMMONIA in the last 168 hours. Coagulation Profile: No results for input(s): INR, PROTIME in the last 168 hours. Cardiac Enzymes: No results for input(s): CKTOTAL, CKMB, CKMBINDEX, TROPONINI in the last 168 hours. BNP (last 3 results) No results for input(s): PROBNP in the last 8760 hours. HbA1C: No results for input(s): HGBA1C in the last 72 hours. CBG: No results for input(s): GLUCAP in the last 168 hours. Lipid Profile: No results for input(s): CHOL, HDL, LDLCALC, TRIG, CHOLHDL, LDLDIRECT in the last 72 hours. Thyroid Function Tests: No  results for input(s): TSH, T4TOTAL, FREET4, T3FREE, THYROIDAB in the last 72 hours. Anemia Panel: No results for input(s): VITAMINB12, FOLATE, FERRITIN, TIBC, IRON, RETICCTPCT in the last 72 hours. Urine analysis:    Component Value Date/Time   COLORURINE YELLOW 07/16/2017 [place] 07/16/2017 1527   LABSPEC 1.020 07/16/2017 1527   PHURINE 6.0 07/16/2017 1527   GLUCOSEU NEGATIVE  07/16/2017 [place] 07/16/2017 1527   BILIRUBINUR NEGATIVE 07/16/2017 1527   KETONESUR NEGATIVE 07/16/2017 1527   PROTEINUR NEGATIVE 07/16/2017 1527   UROBILINOGEN 0.2 03/03/2015 0510   NITRITE NEGATIVE 07/16/2017 1527   LEUKOCYTESUR NEGATIVE 07/16/2017 1527    Radiological Exams on Admission: Dg Foot Complete Left  Result Date: 08/23/2017 CLINICAL DATA:  Patient reports her left foot has been hurting and swelling onset of Monday. Saw her orthopedic yesterday and started on bactrim and celebrex PO. Patient reports no relief and was encouraged to come to the ER for IV antibiotics. EXAM: LEFT FOOT - COMPLETE 3+ VIEW COMPARISON:  None. FINDINGS: No fracture.  No bone lesion. There are no areas of bone resorption to suggest osteomyelitis. Mild hallux valgus at the first metatarsophalangeal joint. Remaining joints are normally spaced and aligned. Mild forefoot soft tissue edema.  No soft tissue air. IMPRESSION: 1. No evidence of osteomyelitis.  No soft tissue air. 2. No fracture. Electronically Signed   By: Lajean Manes M.D.   On: 08/23/2017 15:27    EKG: None  Assessment/Plan Principal Problem:   Cellulitis of foot Active Problems:   Hyperlipidemia   Hypertension   Constipation   Hypokalemia    #1 cellulitis of the left foot Patient presented with erythema, warmth, tenderness to palpation of the left foot. Patient was seen in the orthopedics office and sent to the ED for IV antibiotics although patient had only had 1 dose of oral antibiotics at home. In my opinion patient did  not fill outpatient treatment. Patient however noted to have significant pain and inability to ambulate and as such will place patient on observation. Patient also noted to be on hydrochlorothiazide with concerns for possible gout. Patient with no prior history of gastric. Will check a uric acid level. Plain films of the left foot negative for fracture or osteomyelitis or dislocation. Blood cultures have been obtained and are pending. Place patient empirically on IV Ancef. Keep left foot elevated. IV fluids. Pain management. Supportive care. PT/OT.  #2 hypokalemia Check a magnesium level. Replete.  #3 hyperlipidemia Continue statin.  #4 gastroesophageal reflux disease PPI.  #5 hypertension Stable. Continue home regimen of bystolic. Hold Dyazide. Follow.  #6 constipation Stable. Senokot twice a day. Sorbitol as needed.  DVT prophylaxis: Lovenox Code Status: Full Family Communication: Updated patient. No family at bedside. Disposition Plan: Home in 1-2 days. Consults called: None Admission status: Place in observation.   [facility] MD [facility] Pager [phone]  If 7PM-7AM, please contact night-coverage [email or web address] Password [facility]  08/23/2017, 6:42 PM

## 2017-08-23 NOTE — ED Provider Notes (Signed)
[place] DEPT Provider Note   CSN: [id number] Arrival date & time: 08/23/17  1155     History   Chief Complaint Chief Complaint  Patient presents with  . Foot Pain    HPI [name] is a 66 y.o. female.  HPI   Patient is a 64-year female with a history of hypertension, hyperlipidemia, anxiety, and arthritis presenting for left foot pain for 3 days.  Patient reports that she visited [facility] prescribed Celebrex, when she is reevaluated yesterday, was noted that her left foot was red and swollen.  There is no proceeding trauma to the left lower extremity before this happened.  Patient reports pain is a 10 out of 10.  There was a concern for cellulitis in the foot, and the [facility] orthopedics  prescribed Bactrim at the time.  Patient is continuing to get worse swelling and erythema of the left foot, and she was counseled to come to the emergency department for admission for IV antibiotics.  Patient denies any fever, chills, chest pain, shortness breath, abdominal pain, nausea, vomiting, numbness, muscular weakness in the left lower foot.  Patient reports she feels the infection spreading up her leg with pain and stiffness.  Patient denies any calf tenderness.  Past Medical History:  Diagnosis Date  . Anxiety    takes Xanax prn  . Arthritis   . Diverticulosis   . H/O blood transfusion reaction 2010   hives  . H/O migraine    last 42yrs ago  . Headache(784.0)   . Heart murmur    takes Bystolic nightly  . History of blood clots 43yrs   was on Lovenox injections and Coumadin(was only on that for short period of time)  . History of shingles 3-55yrs ago  . Hx of seasonal allergies    takes Levocetirizine prn  . Hyperlipidemia    takes Crestor nightly  . Hypertension    takes Maxzide daily  . Insomnia    takes Ambien prn  . Joint pain   . Joint swelling   . Trigeminal neuralgia of right side of face 03/26/2016   V2  distribution    Patient Active Problem List   Diagnosis Date Noted  . Trigeminal neuralgia of right side of face 03/26/2016  . Hyperlipidemia 12/04/2013  . Hypertension 12/04/2013  . Constipation 12/04/2013  . Insomnia 12/04/2013  . Acute blood loss anemia 12/04/2013  . Left hip pain 12/04/2013  . 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 Pain due to total hip replacement ([place]) 04/16/2012    Past Surgical History:  Procedure Laterality Date  . ABDOMINAL HYSTERECTOMY    . BREAST BIOPSY  05/03/2006   [place]  . BREAST REDUCTION SURGERY  1987  . CHOLECYSTECTOMY    . COLONOSCOPY    . ESOPHAGOGASTRODUODENOSCOPY    . EYE SURGERY Right    eye lash removed  . JOINT REPLACEMENT     bilateral hip rt in 2007 and lt in 2010  . NM MYOCAR PERF WALL MOTION  11/04/2008   Normal  . REDUCTION MAMMAPLASTY Bilateral 1988  . REDUCTION MAMMAPLASTY    . SHOULDER ARTHROSCOPY WITH DISTAL CLAVICLE RESECTION Right 09/21/2015   Procedure: RIGHT SHOULDER ARTHROSCOPY WITH DISTAL CLAVICLE EXCISION DEBRIDE LABRAL TEAR ACROMIOPLASTY ;  Surgeon: Frederik Pear, MD;  Location: [place];  Service: Orthopedics;  Laterality: Right;  . TEE WITHOUT CARDIOVERSION N/A 04/12/2015   Procedure: TRANSESOPHAGEAL ECHOCARDIOGRAM (TEE)/BUBBLE STUDY;  Surgeon: Adrian Prows,  MD;  Location: [place];  Service: Cardiovascular;  Laterality: N/A;  . TOTAL HIP REVISION  04/14/2012   Procedure: TOTAL HIP REVISION;  Surgeon: Kerin Salen, MD;  Location: [place];  Service: Orthopedics;  Laterality: Right;  right total hip revision  . TOTAL HIP REVISION Left 11/30/2013   Procedure: TOTAL HIP REVISION;  Surgeon: Kerin Salen, MD;  Location: [place];  Service: Orthopedics;  Laterality: Left;    OB History    No data available       Home Medications    Prior to Admission medications   Medication Sig Start Date End Date Taking? Authorizing Provider  Bepotastine Besilate (BEPREVE) 1.5 % SOLN Place 1 drop into both eyes 2 (two)  times daily as needed (allergies).    Yes [provider]  celecoxib (CELEBREX) 200 MG capsule Take 200 mg by mouth 2 (two) times daily. 08/22/17  Yes [provider]  cholecalciferol (VITAMIN D) 1000 UNITS tablet Take 1,000 Units by mouth daily.   Yes [provider]  DEXILANT 60 MG capsule Take 1 capsule by mouth daily. 08/19/17  Yes [provider]  nebivolol (BYSTOLIC) 5 MG tablet Take 5 mg by mouth every evening. For HTN   Yes [provider]  Phenylephrine-APAP-Guaifenesin (TYLENOL SINUS SEVERE) 5-325-200 MG TABS Take 2 tablets by mouth every 6 (six) hours as needed (for cold).   Yes [provider]  rosuvastatin (CRESTOR) 10 MG tablet Take 10 mg by mouth every evening. For hyperlipidemia   Yes [provider]  traMADol (ULTRAM) 50 MG tablet Take 1 tablet (50 mg total) by mouth every 6 (six) hours as needed. Patient taking differently: Take 50 mg by mouth every 6 (six) hours as needed for moderate pain.  06/28/16  Yes Virgel Manifold, MD  triamterene-hydrochlorothiazide (DYAZIDE) 37.5-25 MG capsule Take 1 capsule by mouth daily. 08/09/17  Yes [provider]  zolpidem (AMBIEN) 5 MG tablet Take 5 mg by mouth at bedtime as needed for sleep.  03/18/16  Yes [provider]  dicyclomine (BENTYL) 20 MG tablet Take 1 tablet (20 mg total) by mouth 2 (two) times daily as needed for spasms (abdominal pain). Patient not taking: Reported on 08/23/2017 07/16/17   Gareth Morgan, MD  ondansetron (ZOFRAN ODT) 4 MG disintegrating tablet Take 1 tablet (4 mg total) by mouth every 8 (eight) hours as needed for nausea or vomiting. Patient not taking: Reported on 08/23/2017 07/16/17   Gareth Morgan, MD    Family History Family History  Problem Relation Age of Onset  . Heart attack Mother   . Alzheimer's disease Mother   . Diabetes Sister   . Hypertension Sister     Social History Social History  Substance Use Topics  . Smoking  status: Passive Smoke Exposure - Never Smoker    Years: 42.00    Types: Cigarettes  . Smokeless tobacco: Never Used  . Alcohol use Yes     Comment: occasionally     Allergies   Ibuprofen; Penicillins; and Latex   Review of Systems Review of Systems  Constitutional: Negative for chills and fever.  HENT: Negative for congestion, rhinorrhea and sore throat.   Eyes: Negative for visual disturbance.  Respiratory: Negative for cough, chest tightness and shortness of breath.   Cardiovascular: Negative for chest pain, palpitations and leg swelling.  Gastrointestinal: Negative for abdominal pain, nausea and vomiting.  Genitourinary: Negative for dysuria and flank pain.  Musculoskeletal: Positive for joint swelling. Negative for back pain  and myalgias.  Skin: Positive for color change. Negative for rash and wound.  Neurological: Negative for weakness, numbness and headaches.     Physical Exam Updated Vital Signs BP 105/72 (BP Location: Right Arm)   Pulse 93   Temp 98.5 F (36.9 C) (Oral)   Resp 18   Ht 5' (1.524 m)   Wt 69.9 kg (154 lb)   SpO2 98%   BMI 30.08 kg/m   Physical Exam  Constitutional: She appears well-developed and well-nourished. No distress.  HENT:  Head: Normocephalic and atraumatic.  Mouth/Throat: Oropharynx is clear and moist.  Eyes: Pupils are equal, round, and reactive to light. Conjunctivae and EOM are normal.  Neck: Normal range of motion. Neck supple.  Cardiovascular: Normal rate, regular rhythm, S1 normal and S2 normal.   No murmur heard. Pulmonary/Chest: Effort normal and breath sounds normal. She has no wheezes. She has no rales.  Abdominal: Soft. She exhibits no distension. There is no tenderness. There is no guarding.  Musculoskeletal: She exhibits edema.  ROM limited due to pain but patient able to perform flexion/extension/inversion/eversion actively  Lymphadenopathy:    She has no cervical adenopathy.  Neurological: She is alert.  Cranial  nerves grossly intact. SENSORY: sensation is intact to light touch in:  Superficial peroneal nerve distribution (over dorsum of foot) Deep peroneal nerve distribution (over first dorsal web space) Sural nerve distribution (over lateral aspect 5th metatarsal) Saphenous nerve distribution (over medial instep)  Skin: Skin is warm and dry. No rash noted. There is erythema.  Erythema of the left foot extending to the left ankle, as well as edema around the medial malleolus.  Mild streaking up the left medial leg. Patient is most point tender along the MTP joints as well as the tarsal bones.  Patient is able to move all her toes of the LLE, although range of motion is minimal due to pain.  Patient can flex and extend ankle but range is limited due to pain.  See picture below.  Psychiatric: She has a normal mood and affect. Her behavior is normal. Judgment and thought content normal.  Nursing note and vitals reviewed.      ED Treatments / Results  Labs (all labs ordered are listed, but only abnormal results are displayed) Labs Reviewed  CULTURE, BLOOD (ROUTINE X 2)  CULTURE, BLOOD (ROUTINE X 2)  BASIC METABOLIC PANEL  CBC WITH DIFFERENTIAL/PLATELET    EKG  EKG Interpretation None       Radiology No results found.  Procedures Procedures (including critical care time)  Medications Ordered in ED Medications  ceFAZolin (ANCEF) IVPB 1 g/50 mL premix (not administered)  sodium chloride 0.9 % bolus 1,000 mL (not administered)  oxyCODONE-acetaminophen (PERCOCET/ROXICET) 5-325 MG per tablet 1 tablet (1 tablet Oral Given 08/23/17 1422)     Initial Impression / Assessment and Plan / ED Course  I have reviewed the triage vital signs and the nursing notes.  Pertinent labs & imaging results that were available during my care of the patient were reviewed by me and considered in my medical decision making (see chart for details).     Final Clinical Impressions(s) / ED Diagnoses   Final  diagnoses:  Cellulitis of left lower extremity   Patient afebrile, normotensive, nontachycardic, but is exquisitely tender to palpation in the left foot at this time.  This is suspected cellulitis of the left foot, not associated with trauma.   Do not suspect septic arthritis, as there is no focus to the tenderness.  Will begin IV Ancef in the emergency department, and consult hospital medicine for admission for the patient.  Percocet for pain.  Basic lab work and blood cultures initiated.  Patient case discussed with Dr. Hoover Brunette, who independently evaluated the patient and agrees with plan to admit the patient.  Spoke with hospitalist regarding admission for the patient, as [facility] has been following her all week for this problem, and requested that she be admitted for IV antibiotics.  Documentation of outpatient note regarding this request reviewed.  Admission deferred until further lab studies are available.  Patient care signed out to [facility], PA-C at change of shift, and call placed to admitting team regarding admission.  This is a shared visit with Dr. Vicenta Aly. Patient was independently evaluated by this attending physician. Attending physician consulted in evaluation and admission management.  New Prescriptions New Prescriptions   No medications on file       Tamala Julian 08/23/17 2215    Orlie Dakin, MD 08/25/17 [id number]

## 2017-08-24 DIAGNOSIS — Z8619 Personal history of other infectious and parasitic diseases: Secondary | ICD-10-CM | POA: Diagnosis not present

## 2017-08-24 DIAGNOSIS — Z96643 Presence of artificial hip joint, bilateral: Secondary | ICD-10-CM | POA: Diagnosis present

## 2017-08-24 DIAGNOSIS — K5900 Constipation, unspecified: Secondary | ICD-10-CM | POA: Diagnosis not present

## 2017-08-24 DIAGNOSIS — Z91040 Latex allergy status: Secondary | ICD-10-CM | POA: Diagnosis not present

## 2017-08-24 DIAGNOSIS — M10272 Drug-induced gout, left ankle and foot: Secondary | ICD-10-CM | POA: Diagnosis not present

## 2017-08-24 DIAGNOSIS — M7989 Other specified soft tissue disorders: Secondary | ICD-10-CM | POA: Diagnosis not present

## 2017-08-24 DIAGNOSIS — J302 Other seasonal allergic rhinitis: Secondary | ICD-10-CM | POA: Diagnosis present

## 2017-08-24 DIAGNOSIS — M25475 Effusion, left foot: Secondary | ICD-10-CM | POA: Diagnosis present

## 2017-08-24 DIAGNOSIS — M11279 Other chondrocalcinosis, unspecified ankle and foot: Secondary | ICD-10-CM | POA: Diagnosis not present

## 2017-08-24 DIAGNOSIS — Z86718 Personal history of other venous thrombosis and embolism: Secondary | ICD-10-CM | POA: Diagnosis not present

## 2017-08-24 DIAGNOSIS — F419 Anxiety disorder, unspecified: Secondary | ICD-10-CM | POA: Diagnosis present

## 2017-08-24 DIAGNOSIS — E7800 Pure hypercholesterolemia, unspecified: Secondary | ICD-10-CM | POA: Diagnosis not present

## 2017-08-24 DIAGNOSIS — L03119 Cellulitis of unspecified part of limb: Secondary | ICD-10-CM | POA: Diagnosis not present

## 2017-08-24 DIAGNOSIS — E876 Hypokalemia: Secondary | ICD-10-CM | POA: Diagnosis not present

## 2017-08-24 DIAGNOSIS — Z23 Encounter for immunization: Secondary | ICD-10-CM | POA: Diagnosis not present

## 2017-08-24 DIAGNOSIS — Z886 Allergy status to analgesic agent status: Secondary | ICD-10-CM | POA: Diagnosis not present

## 2017-08-24 DIAGNOSIS — R262 Difficulty in walking, not elsewhere classified: Secondary | ICD-10-CM | POA: Diagnosis present

## 2017-08-24 DIAGNOSIS — Z888 Allergy status to other drugs, medicaments and biological substances status: Secondary | ICD-10-CM | POA: Diagnosis not present

## 2017-08-24 DIAGNOSIS — Z880 Allergy status to penicillin: Secondary | ICD-10-CM | POA: Diagnosis not present

## 2017-08-24 DIAGNOSIS — L03116 Cellulitis of left lower limb: Secondary | ICD-10-CM | POA: Diagnosis not present

## 2017-08-24 DIAGNOSIS — E559 Vitamin D deficiency, unspecified: Secondary | ICD-10-CM | POA: Diagnosis present

## 2017-08-24 DIAGNOSIS — M1990 Unspecified osteoarthritis, unspecified site: Secondary | ICD-10-CM | POA: Diagnosis present

## 2017-08-24 DIAGNOSIS — E785 Hyperlipidemia, unspecified: Secondary | ICD-10-CM | POA: Diagnosis not present

## 2017-08-24 DIAGNOSIS — K219 Gastro-esophageal reflux disease without esophagitis: Secondary | ICD-10-CM | POA: Diagnosis present

## 2017-08-24 DIAGNOSIS — I10 Essential (primary) hypertension: Secondary | ICD-10-CM | POA: Diagnosis not present

## 2017-08-24 LAB — BASIC METABOLIC PANEL
ANION GAP: 10 (ref 5–15)
BUN: 9 mg/dL (ref 6–20)
CALCIUM: 8.7 mg/dL — AB (ref 8.9–10.3)
CO2: 24 mmol/L (ref 22–32)
CREATININE: 0.88 mg/dL (ref 0.44–1.00)
Chloride: 105 mmol/L (ref 101–111)
GFR calc Af Amer: >60 mL/min (ref >60)
GLUCOSE: 109 mg/dL — AB (ref 65–99)
Potassium: 4.3 mmol/L (ref 3.5–5.1)
Sodium: 139 mmol/L (ref 135–145)

## 2017-08-24 LAB — CBC
HCT: 35.9 % — ABNORMAL LOW (ref 36.0–46.0)
HEMOGLOBIN: 11.7 g/dL — AB (ref 12.0–15.0)
MCH: 29.7 pg (ref 26.0–34.0)
MCHC: 32.6 g/dL (ref 30.0–36.0)
MCV: 91.1 fL (ref 78.0–100.0)
PLATELETS: 197 10*3/uL (ref 150–400)
RBC: 3.94 MIL/uL (ref 3.87–5.11)
RDW: 15.5 % (ref 11.5–15.5)
WBC: 6.9 10*3/uL (ref 4.0–10.5)

## 2017-08-24 NOTE — Evaluation (Signed)
Physical Therapy Evaluation Patient Details Name: [name] MRN: [id number] DOB: 18-Sep-1951 Today's Date: 08/24/2017   History of Present Illness  Pt is a 66 y.o. female with medical history significant for diverticulitis, hypertension, hyperlipidemia, anxiety, arthritis presented to the ED with 3 day history of left foot pain, swelling, warmth, tenderness to palpation.   Clinical Impression  Pt admitted as above and presenting with functional mobility limitations 2* L foot pain and ltd WB tolerance.  Pt should progress to dc home with family assist.    Follow Up Recommendations No PT follow up    Equipment Recommendations  None recommended by PT    Recommendations for Other Services       Precautions / Restrictions Precautions Precautions: Fall Restrictions Weight Bearing Restrictions: No      Mobility  Bed Mobility Overal bed mobility: Modified Independent             General bed mobility comments: pt unassisted supine<>sit  Transfers Overall transfer level: Needs assistance Equipment used: Rolling walker (2 wheeled) Transfers: Sit to/from Stand Sit to Stand: Min assist;Min guard         General transfer comment: cues for hand placement. Min assist particularly for safely descending to chair.   Ambulation/Gait Ambulation/Gait assistance: Min assist;Min guard Ambulation Distance (Feet): 30 Feet Assistive device: Rolling walker (2 wheeled) Gait Pattern/deviations: Step-to pattern;Decreased step length - right;Decreased step length - left;Shuffle;Trunk flexed Gait velocity: decr Gait velocity interpretation: Below normal speed for age/gender General Gait Details: cues for posture, position from RW and sequence; Pt tolerating min wt L LE 2* pain  Stairs            Wheelchair Mobility    Modified Rankin (Stroke Patients Only)       Balance                                             Pertinent Vitals/Pain Pain  Assessment: 0-10 Pain Score: 6  Pain Location: L foot  Pain Descriptors / Indicators: Aching;Sore Pain Intervention(s): Limited activity within patient's tolerance;Monitored during session;Premedicated before session    [place] expects to be discharged to:: Private residence Living Arrangements: Spouse/significant other Available Help at Discharge: Family Type of Home: House Home Access: Stairs to enter Entrance Stairs-Rails: Right Entrance Stairs-Number of Steps: 4 Home Layout: Two level Home Equipment: Environmental consultant - 2 wheels;Bedside commode;Tub bench;Cane - quad;Cane - single point;Crutches      Prior Function Level of Independence: Independent               Hand Dominance        Extremity/Trunk Assessment   Upper Extremity Assessment Upper Extremity Assessment: Overall WFL for tasks assessed    Lower Extremity Assessment Lower Extremity Assessment: LLE deficits/detail LLE Deficits / Details: ROM ltd at ankle 2* pain/swelling LLE: Unable to fully assess due to pain    Cervical / Trunk Assessment Cervical / Trunk Assessment: Normal  Communication   Communication: No difficulties  Cognition Arousal/Alertness: Awake/alert Behavior During Therapy: WFL for tasks assessed/performed Overall Cognitive Status: Within Functional Limits for tasks assessed                                        General Comments      Exercises  Assessment/Plan    PT Assessment Patient needs continued PT services  PT Problem List Decreased activity tolerance;Decreased mobility;Decreased knowledge of use of DME;Pain       PT Treatment Interventions DME instruction;Gait training;Stair training;Functional mobility training;Therapeutic activities;Therapeutic exercise;Patient/family education    PT Goals (Current goals can be found in the Care Plan section)  Acute Rehab PT Goals Patient Stated Goal: decrease pain and return to independence. PT  Goal Formulation: With patient Time For Goal Achievement: 08/31/17 Potential to Achieve Goals: Good    Frequency Min 3X/week   Barriers to discharge        Co-evaluation PT/OT/SLP Co-Evaluation/Treatment: Yes Reason for Co-Treatment: Other (comment) (pain) PT goals addressed during session: Mobility/safety with mobility OT goals addressed during session: ADL's and self-care       AM-PAC PT "6 Clicks" Daily Activity  Outcome Measure Difficulty turning over in bed (including adjusting bedclothes, sheets and blankets)?: A Little Difficulty moving from lying on back to sitting on the side of the bed? : A Little Difficulty sitting down on and standing up from a chair with arms (e.g., wheelchair, bedside commode, etc,.)?: A Little Help needed moving to and from a bed to chair (including a wheelchair)?: A Little Help needed walking in hospital room?: A Little Help needed climbing 3-5 steps with a railing? : A Lot 6 Click Score: 17    End of Session Equipment Utilized During Treatment: Gait belt Activity Tolerance: Patient tolerated treatment well;Patient limited by pain Patient left: in chair;with call bell/phone within reach Nurse Communication: Mobility status PT Visit Diagnosis: Difficulty in walking, not elsewhere classified (R26.2);Pain Pain - Right/Left: Left Pain - part of body: Ankle and joints of foot    Time: 1140-1205 PT Time Calculation (min) (ACUTE ONLY): 25 min   Charges:   PT Evaluation $PT Eval Low Complexity: 1 Low     PT G Codes:   PT G-Codes **NOT FOR INPATIENT CLASS** Functional Assessment Tool Used: AM-PAC 6 Clicks Basic Mobility Functional Limitation: Mobility: Walking and moving around Mobility: Walking and Moving Around Current Status (D9242): At least 40 percent but less than 60 percent impaired, limited or restricted Mobility: Walking and Moving Around Goal Status [phone]): At least 1 percent but less than 20 percent impaired, limited or restricted     Pg [phone]   Soledad Budreau 08/24/2017, 2:11 PM

## 2017-08-24 NOTE — Progress Notes (Signed)
Patient arrived on the unit at approximately 2025. She is alert and verbally responsive. Complains of tenderness to left foot but was medicated prior to coming to unit. No other complaints voiced at this time.

## 2017-08-24 NOTE — Evaluation (Signed)
Occupational Therapy Evaluation Patient Details Name: [name] MRN: [id number] DOB: [birth year] Today's Date: 08/24/2017    History of Present Illness Pt is a 66 y.o. female with medical history significant for diverticulitis, hypertension, hyperlipidemia, anxiety, arthritis presented to the ED with 3 day history of left foot pain, swelling, warmth, tenderness to palpation.    Clinical Impression   Pt with 6/10 pain with activity in L foot. Worked on safety with walker use and for functional transfers. Will benefit from continued OT to progress ADL independence for return home with family. Will follow.    Follow Up Recommendations  No OT follow up    Equipment Recommendations  None recommended by OT    Recommendations for Other Services       Precautions / Restrictions Precautions Precautions: Fall Restrictions Weight Bearing Restrictions: No      Mobility Bed Mobility               General bed mobility comments: on EOB when OT arrived.  Transfers Overall transfer level: Needs assistance Equipment used: Rolling walker (2 wheeled) Transfers: Sit to/from Stand Sit to Stand: Min assist         General transfer comment: cues for hand placement. Min assist particularly for safely descending to chair.     Balance                                           ADL either performed or assessed with clinical judgement   ADL Overall ADL's : Needs assistance/impaired Eating/Feeding: Independent;Sitting   Grooming: Wash/dry face;Set up;Sitting   Upper Body Bathing: Set up;Sitting   Lower Body Bathing: Minimal assistance;Sit to/from stand   Upper Body Dressing : Set up;Sitting   Lower Body Dressing: Minimal assistance;Sit to/from stand   Toilet Transfer: Minimal assistance;Ambulation;RW   Toileting- Clothing Manipulation and Hygiene: Minimal assistance;Sit to/from stand         General ADL Comments: Pt able to reach down and don/doff L  sock without difficulty. Educated on safety with functional transfers including the need to reach back for the chair as she sits.      Vision Patient Visual Report: No change from baseline       Perception     Praxis      Pertinent Vitals/Pain Pain Assessment: 0-10 Pain Score: 6  Pain Location: L foot  Pain Descriptors / Indicators: Aching;Sore Pain Intervention(s): Monitored during session;Repositioned     Hand Dominance     Extremity/Trunk Assessment Upper Extremity Assessment Upper Extremity Assessment: Overall WFL for tasks assessed           Communication Communication Communication: No difficulties   Cognition Arousal/Alertness: Awake/alert Behavior During Therapy: WFL for tasks assessed/performed Overall Cognitive Status: Within Functional Limits for tasks assessed                                     General Comments       Exercises     Shoulder Instructions      Home Living Family/patient expects to be discharged to:: Private residence Living Arrangements: Spouse/significant other Available Help at Discharge: Family Type of Home: House       Home Layout: Two level     Bathroom Shower/Tub: Teacher, early years/pre: Standard  Home Equipment: [place] - 2 wheels;Bedside commode;Tub bench          Prior Functioning/Environment Level of Independence: Independent                 OT Problem List: Decreased strength;Decreased knowledge of use of DME or AE      OT Treatment/Interventions: Self-care/ADL training;Patient/family education;DME and/or AE instruction;Therapeutic activities    OT Goals(Current goals can be found in the care plan section) Acute Rehab OT Goals Patient Stated Goal: decrease pain and return to independence. OT Goal Formulation: With patient Time For Goal Achievement: 08/31/17 Potential to Achieve Goals: Good  OT Frequency: Min 2X/week   Barriers to D/C:            Co-evaluation  PT/OT/SLP Co-Evaluation/Treatment: Yes Reason for Co-Treatment: Other (comment) (pain)   OT goals addressed during session: ADL's and self-care;Proper use of Adaptive equipment and DME      AM-PAC PT "6 Clicks" Daily Activity     Outcome Measure Help from another person eating meals?: None Help from another person taking care of personal grooming?: None Help from another person toileting, which includes using toliet, bedpan, or urinal?: A Little Help from another person bathing (including washing, rinsing, drying)?: A Little Help from another person to put on and taking off regular upper body clothing?: None Help from another person to put on and taking off regular lower body clothing?: A Little 6 Click Score: 21   End of Session Equipment Utilized During Treatment: Rolling walker  Activity Tolerance: Patient tolerated treatment well Patient left: in chair;with call bell/phone within reach  OT Visit Diagnosis: Muscle weakness (generalized) (M62.81);Pain Pain - Right/Left: Left Pain - part of body: Ankle and joints of foot                Time: 1150-1206 OT Time Calculation (min): 16 min Charges:  OT General Charges $OT Visit: 1 Visit OT Evaluation $OT Eval Low Complexity: 1 Low G-Codes: OT G-codes **NOT FOR INPATIENT CLASS** Functional Assessment Tool Used: AM-PAC 6 Clicks Daily Activity;Clinical judgement Functional Limitation: Self care Self Care Current Status (N8676): At least 1 percent but less than 20 percent impaired, limited or restricted Self Care Goal Status (H2094): At least 1 percent but less than 20 percent impaired, limited or restricted     Philippa Chester 08/24/2017, 1:16 PM

## 2017-08-24 NOTE — Progress Notes (Signed)
PROGRESS NOTE    [name]  LJQ:[id number] DOB: Apr 23, 1951 DOA: 08/23/2017 PCP: Glendale Chard, MD    Brief Narrative:  Patient is a pleasant 66 year old female history of hypertension, hyperlipidemia, arthritis, anxiety, diverticulitis present to the ED with a three-day history of left foot pain, swelling, warmth, tenderness to palpation. Patient was seen by orthopedic doctor started on some Bactrim however due to worsening symptoms patient was directly to the ED. Patient seen in the ED given some IV Ancef and admitted for an acute cellulitis/left foot pain.   Assessment & Plan:   Principal Problem:   Cellulitis of foot Active Problems:   Hyperlipidemia   Hypertension   Constipation   Hypokalemia  #1 left foot pain/cellulitis Patient presented with left foot pain/cellulitis which had worsened over the past 24 hours prior to admission after being seen by her orthopedic doctor who subsequently sent her to the ED. Plain films of the left foot negative for any fracture. Patient still with significant left foot pain and a such will get a MRI of the left foot to rule out any occult fracture or other etiology for significant left foot pain. Erythema improving however patient still with significant pain and difficulty ambulating. Continue IV Ancef. Follow. Likely transition to oral antibiotics tomorrow with continued improvement and MRI of the foot negative.  #2 hypokalemia Repleted.  #3 hypertension Stable. Continue current regimen of bystolic.  #4 hyperlipidemia Continue Crestor.  #5 constipation Continue Senokot.  #6 vitamin D deficiency Continue oral vitamin D supplementation.   DVT prophylaxis: Lovenox Code Status: Full Family Communication: Updated patient. No family at bedside. Disposition Plan: Likely home the next 24-48 hours if continued clinical improvement and improvement with left foot pain.   Consultants:   None  Procedures:   Plain films of the left  foot 08/23/2017  Antimicrobials:   IV Ancef 08/23/2017   Subjective: Patient sitting up in chair. Patient states some improvement with left foot however still significantly tender to palpation and on attempted ambulation. Patient denies any chest pain or shortness of breath.  Objective: Vitals:   08/23/17 2030 08/23/17 2041 08/24/17 0518 08/24/17 1500  BP:  136/80 104/60 110/61  Pulse:  87 85 83  Resp:  16 16 18   Temp:  99 F (37.2 C) 98.4 F (36.9 C) 98.6 F (37 C)  TempSrc:  Oral Oral Oral  SpO2:  98% 100% 97%  Weight: 69.7 kg (153 lb 10.6 oz)     Height: 5' (1.524 m)       Intake/Output Summary (Last 24 hours) at 08/24/17 2009 Last data filed at 08/24/17 1843  Gross per 24 hour  Intake           2086.1 ml  Output             1300 ml  Net            786.1 ml   Filed Weights   08/23/17 1213 08/23/17 2030  Weight: 69.9 kg (154 lb) 69.7 kg (153 lb 10.6 oz)    Examination:  General exam: Appears calm and comfortable  Respiratory system: Clear to auscultation. Respiratory effort normal. Cardiovascular system: S1 & S2 heard, RRR. No JVD, murmurs, rubs, gallops or clicks. No pedal edema. Gastrointestinal system: Abdomen is nondistended, soft and nontender. No organomegaly or masses felt. Normal bowel sounds heard. Central nervous system: Alert and oriented. No focal neurological deficits. Extremities: Left foot swelling. Left foot with decreasing erythema, significantly tender to palpation. Symmetric 5 x  5 power. Skin: Left foot with decreasing erythema however states still significantly tender to palpation.  Psychiatry: Judgement and insight appear normal. Mood & affect appropriate.     Data Reviewed: I have personally reviewed following labs and imaging studies  CBC:  Recent Labs Lab 08/23/17 1556 08/24/17 0704  WBC 6.8 6.9  NEUTROABS 3.9  --   HGB 12.6 11.7*  HCT 37.8 35.9*  MCV 89.6 91.1  PLT 204 161   Basic Metabolic Panel:  Recent Labs Lab  08/23/17 1556 08/24/17 0704  NA 136 139  K 3.0* 4.3  CL 97* 105  CO2 28 24  GLUCOSE 131* 109*  BUN 13 9  CREATININE 0.91 0.88  CALCIUM 9.1 8.7*  MG 2.1  --    GFR: Estimated Creatinine Clearance: 54.1 mL/min (by C-G formula based on SCr of 0.88 mg/dL). Liver Function Tests: No results for input(s): AST, ALT, ALKPHOS, BILITOT, PROT, ALBUMIN in the last 168 hours. No results for input(s): LIPASE, AMYLASE in the last 168 hours. No results for input(s): AMMONIA in the last 168 hours. Coagulation Profile: No results for input(s): INR, PROTIME in the last 168 hours. Cardiac Enzymes: No results for input(s): CKTOTAL, CKMB, CKMBINDEX, TROPONINI in the last 168 hours. BNP (last 3 results) No results for input(s): PROBNP in the last 8760 hours. HbA1C: No results for input(s): HGBA1C in the last 72 hours. CBG: No results for input(s): GLUCAP in the last 168 hours. Lipid Profile: No results for input(s): CHOL, HDL, LDLCALC, TRIG, CHOLHDL, LDLDIRECT in the last 72 hours. Thyroid Function Tests: No results for input(s): TSH, T4TOTAL, FREET4, T3FREE, THYROIDAB in the last 72 hours. Anemia Panel: No results for input(s): VITAMINB12, FOLATE, FERRITIN, TIBC, IRON, RETICCTPCT in the last 72 hours. Sepsis Labs: No results for input(s): PROCALCITON, LATICACIDVEN in the last 168 hours.  Recent Results (from the past 240 hour(s))  Blood culture (routine x 2)     Status: None (Preliminary result)   Collection Time: 08/23/17  4:10 PM  Result Value Ref Range Status   Specimen Description BLOOD RIGHT WRIST  Final   Special Requests   Final    BOTTLES DRAWN AEROBIC AND ANAEROBIC Blood Culture adequate volume   Culture   Final    NO GROWTH < 24 HOURS Performed at [facility], [address], [place], [place] [address]    Report Status PENDING  Incomplete  Blood culture (routine x 2)     Status: None (Preliminary result)   Collection Time: 08/23/17  4:20 PM  Result Value Ref Range Status    Specimen Description BLOOD LEFT ANTECUBITAL  Final   Special Requests   Final    BOTTLES DRAWN AEROBIC AND ANAEROBIC Blood Culture adequate volume   Culture   Final    NO GROWTH < 24 HOURS Performed at [facility], [place] [address] [place], [place] [address]    Report Status PENDING  Incomplete         Radiology Studies: Dg Foot Complete Left  Result Date: 08/23/2017 CLINICAL DATA:  Patient reports her left foot has been hurting and swelling onset of Monday. Saw her orthopedic yesterday and started on bactrim and celebrex PO. Patient reports no relief and was encouraged to come to the ER for IV antibiotics. EXAM: LEFT FOOT - COMPLETE 3+ VIEW COMPARISON:  None. FINDINGS: No fracture.  No bone lesion. There are no areas of bone resorption to suggest osteomyelitis. Mild hallux valgus at the first metatarsophalangeal joint. Remaining  joints are normally spaced and aligned. Mild forefoot soft tissue edema.  No soft tissue air. IMPRESSION: 1. No evidence of osteomyelitis.  No soft tissue air. 2. No fracture. Electronically Signed   By: Lajean Manes M.D.   On: 08/23/2017 15:27        Scheduled Meds: . cholecalciferol  1,000 Units Oral Daily  . enoxaparin (LOVENOX) injection  40 mg Subcutaneous Q24H  . nebivolol  5 mg Oral QPM  . pantoprazole  40 mg Oral Daily  . rosuvastatin  10 mg Oral QPM  . senna  1 tablet Oral BID  . sodium chloride flush  3 mL Intravenous Q12H   Continuous Infusions: .  ceFAZolin (ANCEF) IV Stopped (08/24/17 1555)     LOS: 0 days    Time spent: 22 minutes    Kathyleen Radice, MD [facility] Pager [phone]  If 7PM-7AM, please contact night-coverage [email or web address] Password [email or web address] 08/24/2017, 8:09 PM

## 2017-08-25 ENCOUNTER — Other Ambulatory Visit: Payer: Self-pay

## 2017-08-25 LAB — CBC WITH DIFFERENTIAL/PLATELET
Basophils Absolute: 0 10*3/uL (ref 0.0–0.1)
Basophils Relative: 0 %
EOS ABS: 0 10*3/uL (ref 0.0–0.7)
Eosinophils Relative: 1 %
HEMATOCRIT: 35.7 % — AB (ref 36.0–46.0)
HEMOGLOBIN: 11.7 g/dL — AB (ref 12.0–15.0)
LYMPHS ABS: 2 10*3/uL (ref 0.7–4.0)
LYMPHS PCT: 30 %
MCH: 29.8 pg (ref 26.0–34.0)
MCHC: 32.8 g/dL (ref 30.0–36.0)
MCV: 91.1 fL (ref 78.0–100.0)
Monocytes Absolute: 1 10*3/uL (ref 0.1–1.0)
Monocytes Relative: 15 %
NEUTROS ABS: 3.5 10*3/uL (ref 1.7–7.7)
NEUTROS PCT: 54 %
Platelets: 211 10*3/uL (ref 150–400)
RBC: 3.92 MIL/uL (ref 3.87–5.11)
RDW: 15.5 % (ref 11.5–15.5)
WBC: 6.5 10*3/uL (ref 4.0–10.5)

## 2017-08-25 LAB — BASIC METABOLIC PANEL
ANION GAP: 7 (ref 5–15)
BUN: 6 mg/dL (ref 6–20)
CHLORIDE: 105 mmol/L (ref 101–111)
CO2: 27 mmol/L (ref 22–32)
CREATININE: 0.9 mg/dL (ref 0.44–1.00)
Calcium: 9 mg/dL (ref 8.9–10.3)
GFR calc non Af Amer: >60 mL/min (ref >60)
Glucose, Bld: 117 mg/dL — ABNORMAL HIGH (ref 65–99)
POTASSIUM: 5 mmol/L (ref 3.5–5.1)
SODIUM: 139 mmol/L (ref 135–145)

## 2017-08-25 LAB — HIV ANTIBODY (ROUTINE TESTING W REFLEX): HIV Screen 4th Generation wRfx: NONREACTIVE

## 2017-08-25 MED ORDER — CEPHALEXIN 500 MG PO CAPS
500.0000 mg | ORAL_CAPSULE | Freq: Three times a day (TID) | ORAL | Status: DC
  Administered 2017-08-25 – 2017-08-29 (×13): 500 mg via ORAL
  Filled 2017-08-25 (×13): qty 1

## 2017-08-25 NOTE — Progress Notes (Signed)
Physical Therapy Treatment Patient Details Name: [name] MRN: [id number] DOB: 1951/01/27 Today's Date: 08/25/2017    History of Present Illness Pt is a 66 y.o. female with medical history significant for diverticulitis, hypertension, hyperlipidemia, anxiety, arthritis presented to the ED with 3 day history of left foot pain, swelling, warmth, tenderness to palpation.     PT Comments    Pt able to increase ambulation distance and manage stairs.  Will continue to follow acutely, but feel she is safe from a mobility standpoint to d/c.   Follow Up Recommendations  No PT follow up     Equipment Recommendations  None recommended by PT    Recommendations for Other Services       Precautions / Restrictions Precautions Precautions: Fall Restrictions Weight Bearing Restrictions: No    Mobility  Bed Mobility Overal bed mobility: Modified Independent             General bed mobility comments: pt unassisted supine<>sit  Transfers Overall transfer level: Modified independent Equipment used: Rolling walker (2 wheeled) Transfers: Sit to/from Stand Sit to Stand: Modified independent (Device/Increase time)         General transfer comment: split hand position for sit to stand  Ambulation/Gait Ambulation/Gait assistance: Min guard;Supervision Ambulation Distance (Feet): 140 Feet Assistive device: Rolling walker (2 wheeled) Gait Pattern/deviations: Decreased step length - left Gait velocity: decr   General Gait Details: cues for unweighting L LE for pain management   Stairs Stairs: Yes   Stair Management: One rail Left;Sideways;Forwards;With cane Number of Stairs: 5(x2) General stair comments: Pt performed stairs with sideways technique with rail on L with ascent and on R with descent.  She also performed with rail and quad cane and reported feeling unsafe with rail and quad cane.  Wheelchair Mobility    Modified Rankin (Stroke Patients Only)        Balance                                            Cognition Arousal/Alertness: Awake/alert Behavior During Therapy: WFL for tasks assessed/performed Overall Cognitive Status: Within Functional Limits for tasks assessed                                        Exercises      General Comments        Pertinent Vitals/Pain Pain Assessment: 0-10 Pain Score: 7  Pain Location: L foot  Pain Descriptors / Indicators: Throbbing Pain Intervention(s): Limited activity within patient's tolerance;Monitored during session;RN gave pain meds during session;Ice applied    Home Living                      Prior Function            PT Goals (current goals can now be found in the care plan section) Acute Rehab PT Goals Patient Stated Goal: decrease pain and return to independence. PT Goal Formulation: With patient Time For Goal Achievement: 08/31/17 Potential to Achieve Goals: Good Progress towards PT goals: Progressing toward goals    Frequency    Min 3X/week      PT Plan Current plan remains appropriate    Co-evaluation              AM-PAC PT "6  Clicks" Daily Activity  Outcome Measure  Difficulty turning over in bed (including adjusting bedclothes, sheets and blankets)?: None Difficulty moving from lying on back to sitting on the side of the bed? : None Difficulty sitting down on and standing up from a chair with arms (e.g., wheelchair, bedside commode, etc,.)?: None Help needed moving to and from a bed to chair (including a wheelchair)?: None Help needed walking in hospital room?: A Little Help needed climbing 3-5 steps with a railing? : A Little 6 Click Score: 22    End of Session Equipment Utilized During Treatment: Gait belt Activity Tolerance: Patient tolerated treatment well Patient left: in bed;with call bell/phone within reach Nurse Communication: Mobility status PT Visit Diagnosis: Difficulty in walking, not  elsewhere classified (R26.2);Pain Pain - Right/Left: Left Pain - part of body: Ankle and joints of foot     Time: 0940-1002 PT Time Calculation (min) (ACUTE ONLY): 22 min  Charges:  $Gait Training: 8-22 mins                    G Codes:       [name], [place] Pager [phone] 08/25/2017    [name] 08/25/2017, 10:12 AM

## 2017-08-25 NOTE — Progress Notes (Signed)
Nutrition Brief Note  Patient identified on the Malnutrition Screening Tool (MST) Report  Patient consuming 100% of meals. Weight is stable.   Wt Readings from Last 15 Encounters:  08/25/17 157 lb 6.5 oz (71.4 kg)  05/13/17 154 lb (69.9 kg)  11/28/16 152 lb (68.9 kg)  09/18/16 151 lb (68.5 kg)  06/29/16 154 lb (69.9 kg)  06/28/16 154 lb (69.9 kg)  05/29/16 154 lb (69.9 kg)  03/26/16 156 lb (70.8 kg)  01/08/16 160 lb (72.6 kg)  09/21/15 163 lb 6 oz (74.1 kg)  04/12/15 161 lb (73 kg)  03/03/15 162 lb (73.5 kg)  12/12/13 167 lb (75.8 kg)  12/01/13 166 lb 1.6 oz (75.3 kg)  11/26/13 166 lb 1.6 oz (75.3 kg)    Body mass index is 30.74 kg/m. Patient meets criteria for obesity based on current BMI.   Current diet order is regular, patient is consuming approximately 100% of meals at this time. Labs and medications reviewed.   No nutrition interventions warranted at this time. If nutrition issues arise, please consult RD.   Clayton Bibles, MS, RD, [place] [facility] Pager: [phone] After Hours Pager: [phone]

## 2017-08-25 NOTE — Progress Notes (Signed)
PROGRESS NOTE    [name]  IRJ:[id number] DOB: [birth year] DOA: 08/23/2017 PCP: Glendale Chard, MD    Brief Narrative:  Patient is a pleasant 66 year old female history of hypertension, hyperlipidemia, arthritis, anxiety, diverticulitis present to the ED with a three-day history of left foot pain, swelling, warmth, tenderness to palpation. Patient was seen by orthopedic doctor started on some Bactrim however due to worsening symptoms patient was directly to the ED. Patient seen in the ED given some IV Ancef and admitted for an acute cellulitis/left foot pain.   Assessment & Plan:   Principal Problem:   Cellulitis of foot Active Problems:   Hyperlipidemia   Hypertension   Constipation   Hypokalemia  #1 left foot pain/cellulitis Patient presented with left foot pain/cellulitis which had worsened over the past 24 hours prior to admission after being seen by her orthopedic doctor who subsequently sent her to the ED. Plain films of the left foot negative for any fracture. Patient still with significant left foot pain to the point where she is unable to ambulate or place pressure on her foot due to pain.  MRI of the left foot ordered 08/24/2017 (still pending) to rule out any occult fracture or other etiology for significant left foot pain. Erythema improving however patient still with significant pain and difficulty ambulating. Transition from IV Ancef to oral Keflex.  #2 hypokalemia Repleted.  #3 hypertension Blood pressure stable. Continue bystolic.  #4 hyperlipidemia Continue Crestor.  #5 constipation Continue Senokot.  #6 vitamin D deficiency Continue oral vitamin D supplementation.   DVT prophylaxis: Lovenox Code Status: Full Family Communication: Updated patient. No family at bedside. Disposition Plan: Likely home the next 24-48 hours if continued clinical improvement and improvement with left foot pain and unremarkable MRI foot.   Consultants:    None  Procedures:   Plain films of the left foot 08/23/2017  MRI left foot pending  Antimicrobials:   IV Ancef 08/23/2017>>>>> 08/25/2017  Oral Keflex 08/25/2017   Subjective: Patient sitting up in bed. Denies any chest pain or shortness of breath. Patient states still with significant pain in the left foot with some difficulty ambulating due to pain and hesitancy.   Objective: Vitals:   08/24/17 1500 08/24/17 2056 08/25/17 0500 08/25/17 0509  BP: 110/61 117/60  (!) 110/58  Pulse: 83 86  85  Resp: 18 17  15   Temp: 98.6 F (37 C) 99.3 F (37.4 C)  98.5 F (36.9 C)  TempSrc: Oral Oral  Oral  SpO2: 97% 100%  100%  Weight:   71.4 kg (157 lb 6.5 oz)   Height:        Intake/Output Summary (Last 24 hours) at 08/25/2017 1302 Last data filed at 08/25/2017 0937 Gross per 24 hour  Intake 1172.5 ml  Output -  Net 1172.5 ml   Filed Weights   08/23/17 1213 08/23/17 2030 08/25/17 0500  Weight: 69.9 kg (154 lb) 69.7 kg (153 lb 10.6 oz) 71.4 kg (157 lb 6.5 oz)    Examination:  General exam: Appears calm and comfortable  Respiratory system: Clear to auscultation bilaterally. No wheezes, no crackles, no rhonchi. Respiratory effort normal. Cardiovascular system: Regular rate rhythm no murmurs rubs or gallops. No JVD.  Gastrointestinal system: Abdomen is nondistended, soft and nontender. No organomegaly or masses felt. Normal bowel sounds heard. Central nervous system: Alert and oriented. No focal neurological deficits. Extremities: Left foot swelling decreasing. Left foot with decreasing erythema, significantly tender to palpation. Symmetric 5 x 5 power. Skin: Left  foot with decreasing erythema however states still significantly tender to palpation.  Psychiatry: Judgement and insight appear normal. Mood & affect appropriate.     Data Reviewed: I have personally reviewed following labs and imaging studies  CBC: Recent Labs  Lab 08/23/17 1556 08/24/17 0704 08/25/17 0622   WBC 6.8 6.9 6.5  NEUTROABS 3.9  --  3.5  HGB 12.6 11.7* 11.7*  HCT 37.8 35.9* 35.7*  MCV 89.6 91.1 91.1  PLT 204 197 629   Basic Metabolic Panel: Recent Labs  Lab 08/23/17 1556 08/24/17 0704 08/25/17 0622  NA 136 139 139  K 3.0* 4.3 5.0  CL 97* 105 105  CO2 28 24 27   GLUCOSE 131* 109* 117*  BUN 13 9 6   CREATININE 0.91 0.88 0.90  CALCIUM 9.1 8.7* 9.0  MG 2.1  --   --    GFR: Estimated Creatinine Clearance: 53.5 mL/min (by C-G formula based on SCr of 0.9 mg/dL). Liver Function Tests: No results for input(s): AST, ALT, ALKPHOS, BILITOT, PROT, ALBUMIN in the last 168 hours. No results for input(s): LIPASE, AMYLASE in the last 168 hours. No results for input(s): AMMONIA in the last 168 hours. Coagulation Profile: No results for input(s): INR, PROTIME in the last 168 hours. Cardiac Enzymes: No results for input(s): CKTOTAL, CKMB, CKMBINDEX, TROPONINI in the last 168 hours. BNP (last 3 results) No results for input(s): PROBNP in the last 8760 hours. HbA1C: No results for input(s): HGBA1C in the last 72 hours. CBG: No results for input(s): GLUCAP in the last 168 hours. Lipid Profile: No results for input(s): CHOL, HDL, LDLCALC, TRIG, CHOLHDL, LDLDIRECT in the last 72 hours. Thyroid Function Tests: No results for input(s): TSH, T4TOTAL, FREET4, T3FREE, THYROIDAB in the last 72 hours. Anemia Panel: No results for input(s): VITAMINB12, FOLATE, FERRITIN, TIBC, IRON, RETICCTPCT in the last 72 hours. Sepsis Labs: No results for input(s): PROCALCITON, LATICACIDVEN in the last 168 hours.  Recent Results (from the past 240 hour(s))  Blood culture (routine x 2)     Status: None (Preliminary result)   Collection Time: 08/23/17  4:10 PM  Result Value Ref Range Status   Specimen Description BLOOD RIGHT WRIST  Final   Special Requests   Final    BOTTLES DRAWN AEROBIC AND ANAEROBIC Blood Culture adequate volume   Culture   Final    NO GROWTH < 24 HOURS Performed at [facility], [address] [place], [place] [address]    Report Status PENDING  Incomplete  Blood culture (routine x 2)     Status: None (Preliminary result)   Collection Time: 08/23/17  4:20 PM  Result Value Ref Range Status   Specimen Description BLOOD LEFT ANTECUBITAL  Final   Special Requests   Final    BOTTLES DRAWN AEROBIC AND ANAEROBIC Blood Culture adequate volume   Culture   Final    NO GROWTH < 24 HOURS Performed at [facility], [place] [address], [place], [place] [address]    Report Status PENDING  Incomplete         Radiology Studies: Dg Foot Complete Left  Result Date: 08/23/2017 CLINICAL DATA:  Patient reports her left foot has been hurting and swelling onset of Monday. Saw her orthopedic yesterday and started on bactrim and celebrex PO. Patient reports no relief and was encouraged to come to the ER for IV antibiotics. EXAM: LEFT FOOT - COMPLETE 3+ VIEW COMPARISON:  None. FINDINGS: No fracture.  No bone lesion. There are  no areas of bone resorption to suggest osteomyelitis. Mild hallux valgus at the first metatarsophalangeal joint. Remaining joints are normally spaced and aligned. Mild forefoot soft tissue edema.  No soft tissue air. IMPRESSION: 1. No evidence of osteomyelitis.  No soft tissue air. 2. No fracture. Electronically Signed   By: Lajean Manes M.D.   On: 08/23/2017 15:27        Scheduled Meds: . cephALEXin  500 mg Oral Q8H  . cholecalciferol  1,000 Units Oral Daily  . enoxaparin (LOVENOX) injection  40 mg Subcutaneous Q24H  . nebivolol  5 mg Oral QPM  . pantoprazole  40 mg Oral Daily  . rosuvastatin  10 mg Oral QPM  . senna  1 tablet Oral BID  . sodium chloride flush  3 mL Intravenous Q12H   Continuous Infusions:    LOS: 1 day    Time spent: 98 minutes    THOMPSON,DANIEL, MD [facility] Pager [phone]  If 7PM-7AM, please contact night-coverage [email or web address] Password [email or web address] 08/25/2017, 1:02 PM

## 2017-08-25 NOTE — Progress Notes (Signed)
Occupational Therapy Treatment and Discharge Patient Details Name: [name] MRN: [id number] DOB: [birth year] Today's Date: 08/25/2017    History of present illness Pt is a 66 y.o. female with medical history significant for diverticulitis, hypertension, hyperlipidemia, anxiety, arthritis presented to the ED with 3 day history of left foot pain, swelling, warmth, tenderness to palpation.    OT comments  Pt moving well and able to stand at sink for grooming. Performed dressing with set up. Educated in how to transport items safely with RW. No further OT needs.  Follow Up Recommendations  No OT follow up    Equipment Recommendations  None recommended by OT    Recommendations for Other Services      Precautions / Restrictions Precautions Precautions: Fall       Mobility Bed Mobility Overal bed mobility: Modified Independent                Transfers Overall transfer level: Modified independent Equipment used: Rolling walker (2 wheeled)             General transfer comment: cues for hand placement with stand to sit    Balance                                           ADL either performed or assessed with clinical judgement   ADL Overall ADL's : Needs assistance/impaired     Grooming: Oral care;Standing;Supervision/safety           Upper Body Dressing : Set up;Sitting   Lower Body Dressing: Set up;Sitting/lateral leans Lower Body Dressing Details (indicate cue type and reason): socks Toilet Transfer: Supervision/safety;RW;BSC;Ambulation   Toileting- Clothing Manipulation and Hygiene: Modified independent;Sit to/from Nurse, children's Details (indicate cue type and reason): reviewed technique verbally Functional mobility during ADLs: Supervision/safety;Rolling walker General ADL Comments: instructed in safe method for transporting items with RW     Vision       Perception     Praxis      Cognition  Arousal/Alertness: Awake/alert Behavior During Therapy: WFL for tasks assessed/performed Overall Cognitive Status: Within Functional Limits for tasks assessed                                          Exercises     Shoulder Instructions       General Comments      Pertinent Vitals/ Pain       Pain Assessment: 0-10 Pain Score: 6  Pain Location: L foot  Pain Descriptors / Indicators: Aching;Sore Pain Intervention(s): Monitored during session;Premedicated before session;Patient requesting pain meds-RN notified;Repositioned  Home Living                                          Prior Functioning/Environment              Frequency           Progress Toward Goals  OT Goals(current goals can now be found in the care plan section)  Progress towards OT goals: Goals met/education completed, patient discharged from OT  Acute Rehab OT Goals Patient Stated Goal: decrease pain and return to independence.  Plan Discharge plan  remains appropriate;All goals met and education completed, patient discharged from OT services    Co-evaluation                 AM-PAC PT "6 Clicks" Daily Activity     Outcome Measure   Help from another person eating meals?: None Help from another person taking care of personal grooming?: A Little Help from another person toileting, which includes using toliet, bedpan, or urinal?: A Little Help from another person bathing (including washing, rinsing, drying)?: None Help from another person to put on and taking off regular upper body clothing?: None Help from another person to put on and taking off regular lower body clothing?: None 6 Click Score: 22    End of Session    OT Visit Diagnosis: Muscle weakness (generalized) (M62.81);Pain   Activity Tolerance Patient tolerated treatment well   Patient Left in chair;with call bell/phone within reach   Nurse Communication          Time: 8786-7672 OT  Time Calculation (min): 22 min  Charges: OT General Charges $OT Visit: 1 Visit OT Treatments $Self Care/Home Management : 8-22 mins  08/25/2017 [name], OTR/L Pager: [phone]   Werner Lean Haze Boyden 08/25/2017, 9:06 AM

## 2017-08-26 ENCOUNTER — Inpatient Hospital Stay (HOSPITAL_COMMUNITY): Payer: Medicare Other

## 2017-08-26 LAB — BASIC METABOLIC PANEL
ANION GAP: 9 (ref 5–15)
BUN: 8 mg/dL (ref 6–20)
CO2: 26 mmol/L (ref 22–32)
Calcium: 9.1 mg/dL (ref 8.9–10.3)
Chloride: 102 mmol/L (ref 101–111)
Creatinine, Ser: 0.81 mg/dL (ref 0.44–1.00)
GFR calc non Af Amer: >60 mL/min (ref >60)
GLUCOSE: 134 mg/dL — AB (ref 65–99)
Potassium: 3.6 mmol/L (ref 3.5–5.1)
Sodium: 137 mmol/L (ref 135–145)

## 2017-08-26 MED ORDER — GADOBENATE DIMEGLUMINE 529 MG/ML IV SOLN: 15.0000 mL | Freq: Once | INTRAVENOUS | Status: DC | PRN

## 2017-08-26 MED ORDER — POTASSIUM CHLORIDE CRYS ER 20 MEQ PO TBCR
40.0000 meq | EXTENDED_RELEASE_TABLET | Freq: Once | ORAL | Status: AC
  Administered 2017-08-26: 40 meq via ORAL
  Filled 2017-08-26: qty 2

## 2017-08-26 NOTE — Progress Notes (Signed)
Physical Therapy Treatment Patient Details Name: [name] MRN: [id number] DOB: 16-Jul-1951 Today's Date: 08/26/2017    History of Present Illness Pt is a 66 y.o. female with medical history significant for diverticulitis, hypertension, hyperlipidemia, anxiety, arthritis presented to the ED with 3 day history of left foot pain, swelling, warmth, tenderness to palpation.     PT Comments    Assisted OOB to amb a limited distance in hallway with walker.  See mobility details below.   Follow Up Recommendations  No PT follow up     Equipment Recommendations  None recommended by PT    Recommendations for Other Services       Precautions / Restrictions Precautions Precautions: Fall Precaution Comments: LLE cellulitis    Mobility  Bed Mobility Overal bed mobility: Modified Independent             General bed mobility comments: only required increased time   Transfers Overall transfer level: Modified independent Equipment used: Rolling walker (2 wheeled) Transfers: Sit to/from [facility] Sit to Stand: Modified independent (Device/Increase time) Stand pivot transfers: Modified independent (Device/Increase time)       General transfer comment: increased time for safety  Ambulation/Gait Ambulation/Gait assistance: Supervision;Min guard Ambulation Distance (Feet): 128 Feet Assistive device: Rolling walker (2 wheeled) Gait Pattern/deviations: Decreased step length - left;Decreased stance time - left Gait velocity: decreased   General Gait Details: excessive WBing thru walker and increased time with <25% VC's safety with turns.     Stairs            Wheelchair Mobility    Modified Rankin (Stroke Patients Only)       Balance                                            Cognition Arousal/Alertness: Awake/alert Behavior During Therapy: WFL for tasks assessed/performed Overall Cognitive Status: Within Functional  Limits for tasks assessed                                        Exercises      General Comments        Pertinent Vitals/Pain Pain Assessment: Faces Faces Pain Scale: Hurts a little bit Pain Descriptors / Indicators: Throbbing Pain Intervention(s): Monitored during session;Repositioned    Home Living                      Prior Function            PT Goals (current goals can now be found in the care plan section) Progress towards PT goals: Progressing toward goals    Frequency    Min 3X/week      PT Plan Current plan remains appropriate    Co-evaluation              AM-PAC PT "6 Clicks" Daily Activity  Outcome Measure  Difficulty turning over in bed (including adjusting bedclothes, sheets and blankets)?: None Difficulty moving from lying on back to sitting on the side of the bed? : None Difficulty sitting down on and standing up from a chair with arms (e.g., wheelchair, bedside commode, etc,.)?: None Help needed moving to and from a bed to chair (including a wheelchair)?: None Help needed walking in hospital room?: A Little Help  needed climbing 3-5 steps with a railing? : A Little 6 Click Score: 22    End of Session Equipment Utilized During Treatment: Gait belt Activity Tolerance: Patient tolerated treatment well Patient left: in bed;with call bell/phone within reach Nurse Communication: Mobility status PT Visit Diagnosis: Difficulty in walking, not elsewhere classified (R26.2);Pain Pain - Right/Left: Left     Time: 8638-1771 PT Time Calculation (min) (ACUTE ONLY): 18 min  Charges:  $Gait Training: 8-22 mins                    G Codes:       Rica Koyanagi  PTA [facility]  Acute  Rehab Pager      [phone]

## 2017-08-26 NOTE — Progress Notes (Signed)
PROGRESS NOTE    [name]  ZDG:[id number] DOB: [birth year] DOA: 08/23/2017 PCP: Glendale Chard, MD    Brief Narrative:  Patient is a pleasant 66 year old female history of hypertension, hyperlipidemia, arthritis, anxiety, diverticulitis present to the ED with a three-day history of left foot pain, swelling, warmth, tenderness to palpation. Patient was seen by orthopedic doctor started on some Bactrim however due to worsening symptoms patient was directly to the ED. Patient seen in the ED given some IV Ancef and admitted for an acute cellulitis/left foot pain.   Assessment & Plan:   Principal Problem:   Cellulitis of foot Active Problems:   Hyperlipidemia   Hypertension   Constipation   Hypokalemia  #1 left foot pain/cellulitis Patient presented with left foot pain/cellulitis which had worsened over the past 24 hours prior to admission after being seen by her orthopedic doctor who subsequently sent her to the ED. Plain films of the left foot negative for any fracture. Patient still with significant left foot pain to the point where she is unable to ambulate or place pressure on her foot due to pain. Patient with worsening swelling in the left foot and erythema and pain. MRI of the left foot ordered 08/24/2017 (still pending) to rule out any occult fracture or other etiology for significant left foot pain. Patient has been transitioned from IV Ancef to oral Keflex.  #2 hypokalemia Repleted.  #3 hypertension Blood pressure is borderline. Discontinue bystolic.  #4 hyperlipidemia Continue Crestor.  #5 constipation Continue Senokot.  #6 vitamin D deficiency Continue oral vitamin D supplementation.   DVT prophylaxis: Lovenox Code Status: Full Family Communication: Updated patient. No family at bedside. Disposition Plan: Likely home the next 24-48 hours if continued clinical improvement and improvement with left foot pain and unremarkable MRI foot.   Consultants:    None  Procedures:   Plain films of the left foot 08/23/2017  MRI left foot pending  Antimicrobials:   IV Ancef 08/23/2017>>>>> 08/25/2017  Oral Keflex 08/25/2017   Subjective: Patient states left foot swelling has worsened with increasing erythema and pain. No chest pain no shortness of breath.   Objective: Vitals:   08/25/17 0500 08/25/17 0509 08/25/17 2054 08/26/17 0453  BP:  (!) 110/58 103/63 103/67  Pulse:  85 79 92  Resp:  15 15 15   Temp:  98.5 F (36.9 C) 98.8 F (37.1 C) 98.5 F (36.9 C)  TempSrc:  Oral Oral Oral  SpO2:  100% 100% 95%  Weight: 71.4 kg (157 lb 6.5 oz)     Height:        Intake/Output Summary (Last 24 hours) at 08/26/2017 1617 Last data filed at 08/26/2017 0938 Gross per 24 hour  Intake 720 ml  Output -  Net 720 ml   Filed Weights   08/23/17 1213 08/23/17 2030 08/25/17 0500  Weight: 69.9 kg (154 lb) 69.7 kg (153 lb 10.6 oz) 71.4 kg (157 lb 6.5 oz)    Examination:  General exam: NAD. Respiratory system: Clear to auscultation bilaterally. No wheezes, no crackles, no rhonchi. Respiratory effort normal. Cardiovascular system: Regular rate rhythm no murmurs rubs or gallops. No JVD.  Gastrointestinal system: Abdomen is soft, nontender, nondistended, positive bowel sounds.  Central nervous system: Alert and oriented. No focal neurological deficits. Extremities: Left foot swelling worsening. Left foot with some increasing erythema and tenderness to palpation. Symmetric 5 x 5 power. Skin: Left foot with increasing erythema, swelling and significantly tender to palpation.  Psychiatry: Judgement and insight appear normal. Mood &  affect appropriate.     Data Reviewed: I have personally reviewed following labs and imaging studies  CBC: Recent Labs  Lab 08/23/17 1556 08/24/17 0704 08/25/17 0622  WBC 6.8 6.9 6.5  NEUTROABS 3.9  --  3.5  HGB 12.6 11.7* 11.7*  HCT 37.8 35.9* 35.7*  MCV 89.6 91.1 91.1  PLT 204 197 448   Basic Metabolic  Panel: Recent Labs  Lab 08/23/17 1556 08/24/17 0704 08/25/17 0622 08/26/17 0547  NA 136 139 139 137  K 3.0* 4.3 5.0 3.6  CL 97* 105 105 102  CO2 28 24 27 26   GLUCOSE 131* 109* 117* 134*  BUN 13 9 6 8   CREATININE 0.91 0.88 0.90 0.81  CALCIUM 9.1 8.7* 9.0 9.1  MG 2.1  --   --   --    GFR: Estimated Creatinine Clearance: 59.5 mL/min (by C-G formula based on SCr of 0.81 mg/dL). Liver Function Tests: No results for input(s): AST, ALT, ALKPHOS, BILITOT, PROT, ALBUMIN in the last 168 hours. No results for input(s): LIPASE, AMYLASE in the last 168 hours. No results for input(s): AMMONIA in the last 168 hours. Coagulation Profile: No results for input(s): INR, PROTIME in the last 168 hours. Cardiac Enzymes: No results for input(s): CKTOTAL, CKMB, CKMBINDEX, TROPONINI in the last 168 hours. BNP (last 3 results) No results for input(s): PROBNP in the last 8760 hours. HbA1C: No results for input(s): HGBA1C in the last 72 hours. CBG: No results for input(s): GLUCAP in the last 168 hours. Lipid Profile: No results for input(s): CHOL, HDL, LDLCALC, TRIG, CHOLHDL, LDLDIRECT in the last 72 hours. Thyroid Function Tests: No results for input(s): TSH, T4TOTAL, FREET4, T3FREE, THYROIDAB in the last 72 hours. Anemia Panel: No results for input(s): VITAMINB12, FOLATE, FERRITIN, TIBC, IRON, RETICCTPCT in the last 72 hours. Sepsis Labs: No results for input(s): PROCALCITON, LATICACIDVEN in the last 168 hours.  Recent Results (from the past 240 hour(s))  Blood culture (routine x 2)     Status: None (Preliminary result)   Collection Time: 08/23/17  4:10 PM  Result Value Ref Range Status   Specimen Description BLOOD RIGHT WRIST  Final   Special Requests   Final    BOTTLES DRAWN AEROBIC AND ANAEROBIC Blood Culture adequate volume   Culture   Final    NO GROWTH 3 DAYS Performed at [facility], [address], [place], [place] [address]    Report Status PENDING  Incomplete  Blood  culture (routine x 2)     Status: None (Preliminary result)   Collection Time: 08/23/17  4:20 PM  Result Value Ref Range Status   Specimen Description BLOOD LEFT ANTECUBITAL  Final   Special Requests   Final    BOTTLES DRAWN AEROBIC AND ANAEROBIC Blood Culture adequate volume   Culture   Final    NO GROWTH 3 DAYS Performed at [facility], [place] [address] [place], [place] [address]    Report Status PENDING  Incomplete         Radiology Studies: Mr Foot Left Wo Contrast  Result Date: 08/26/2017 CLINICAL DATA:  Diffuse left ankle and foot pain. EXAM: MRI OF THE LEFT FOOT WITHOUT CONTRAST TECHNIQUE: Multiplanar, multisequence MR imaging of the ankle was performed. No intravenous contrast was administered. COMPARISON:  Radiographs 08/23/2017 FINDINGS: TENDONS Peroneal: Intact Posteromedial: Intact Anterior: Intact Achilles: Mild tendinopathy. Plantar Fascia: Intact LIGAMENTS Lateral: Intact Medial: Intact CARTILAGE Ankle Joint: Mild degenerative changes with small joint effusion. No osteochondral lesion. Subtalar  Joints/Sinus Tarsi: The subtalar joints are maintained. No significant degenerative changes or erosions. The sinus tarsi appears normal. The cervical and interosseous ligaments are intact. There are no joint effusions involving the second and third MTP joints without obvious erosive changes. Mild degenerative changes and hallux valgus deformity at the first MTP joint. Bones: No stress fracture or AVN. Other: Moderate edema like signal abnormality involving the forefoot musculature suggesting nonspecific myositis. There is also moderate subcutaneous soft tissue swelling/ edema/ fluid mainly along the dorsum of the foot. IMPRESSION: 1. Nonspecific moderate subcutaneous soft tissue swelling/edema/fluid mainly involving the foot dorsally. 2. Moderate changes of myositis involving the forefoot musculature. 3. Moderate-sized joint effusion involving the second and third MTP joints without  obvious erosions. 4. No stress fracture or osteochondral lesions. 5. Intact medial and lateral ankle ligaments and tendons. Electronically Signed   By: Marijo Sanes M.D.   On: 08/26/2017 15:33        Scheduled Meds: . cephALEXin  500 mg Oral Q8H  . cholecalciferol  1,000 Units Oral Daily  . enoxaparin (LOVENOX) injection  40 mg Subcutaneous Q24H  . nebivolol  5 mg Oral QPM  . pantoprazole  40 mg Oral Daily  . rosuvastatin  10 mg Oral QPM  . senna  1 tablet Oral BID  . sodium chloride flush  3 mL Intravenous Q12H   Continuous Infusions:    LOS: 2 days    Time spent: 35 minutes    Zhamir Pirro, MD [facility] Pager [phone]  If 7PM-7AM, please contact night-coverage [email or web address] Password [facility] 08/26/2017, 4:17 PM

## 2017-08-27 DIAGNOSIS — M11279 Other chondrocalcinosis, unspecified ankle and foot: Secondary | ICD-10-CM

## 2017-08-27 DIAGNOSIS — M10272 Drug-induced gout, left ankle and foot: Secondary | ICD-10-CM

## 2017-08-27 LAB — BASIC METABOLIC PANEL
ANION GAP: 11 (ref 5–15)
BUN: 7 mg/dL (ref 6–20)
CALCIUM: 9 mg/dL (ref 8.9–10.3)
CO2: 25 mmol/L (ref 22–32)
Chloride: 103 mmol/L (ref 101–111)
Creatinine, Ser: 0.72 mg/dL (ref 0.44–1.00)
GFR calc Af Amer: >60 mL/min (ref >60)
GFR calc non Af Amer: >60 mL/min (ref >60)
GLUCOSE: 119 mg/dL — AB (ref 65–99)
POTASSIUM: 4.3 mmol/L (ref 3.5–5.1)
Sodium: 139 mmol/L (ref 135–145)

## 2017-08-27 LAB — CBC WITH DIFFERENTIAL/PLATELET
Basophils Absolute: 0 10*3/uL (ref 0.0–0.1)
Basophils Relative: 0 %
Eosinophils Absolute: 0.1 10*3/uL (ref 0.0–0.7)
Eosinophils Relative: 1 %
HEMATOCRIT: 34.9 % — AB (ref 36.0–46.0)
Hemoglobin: 11.4 g/dL — ABNORMAL LOW (ref 12.0–15.0)
LYMPHS PCT: 25 %
Lymphs Abs: 1.4 10*3/uL (ref 0.7–4.0)
MCH: 29.5 pg (ref 26.0–34.0)
MCHC: 32.7 g/dL (ref 30.0–36.0)
MCV: 90.4 fL (ref 78.0–100.0)
MONO ABS: 0.8 10*3/uL (ref 0.1–1.0)
MONOS PCT: 15 %
NEUTROS ABS: 3.3 10*3/uL (ref 1.7–7.7)
Neutrophils Relative %: 59 %
Platelets: 231 10*3/uL (ref 150–400)
RBC: 3.86 MIL/uL — ABNORMAL LOW (ref 3.87–5.11)
RDW: 15 % (ref 11.5–15.5)
WBC: 5.6 10*3/uL (ref 4.0–10.5)

## 2017-08-27 MED ORDER — PREDNISONE 20 MG PO TABS: 40.0000 mg | ORAL_TABLET | Freq: Every day | ORAL | Status: DC

## 2017-08-27 MED ORDER — COLCHICINE 0.6 MG PO TABS
0.6000 mg | ORAL_TABLET | Freq: Every day | ORAL | Status: DC
  Administered 2017-08-27: 0.6 mg via ORAL
  Filled 2017-08-27: qty 1

## 2017-08-27 MED ORDER — PREDNISONE 20 MG PO TABS: 40.0000 mg | ORAL_TABLET | Freq: Once | ORAL | Status: DC

## 2017-08-27 MED ORDER — PREDNISONE 20 MG PO TABS: 60.0000 mg | ORAL_TABLET | Freq: Once | ORAL | Status: DC

## 2017-08-27 MED ORDER — COLCHICINE 0.6 MG PO TABS
0.6000 mg | ORAL_TABLET | Freq: Two times a day (BID) | ORAL | Status: DC
  Administered 2017-08-27 – 2017-08-29 (×4): 0.6 mg via ORAL
  Filled 2017-08-27 (×4): qty 1

## 2017-08-27 NOTE — Care Management Important Message (Signed)
Important Message  Patient Details  Name: GARRY BOCHICCHIO MRN: [id number] Date of Birth: Sep 01, 1951   Medicare Important Message Given:  Yes    Kerin Salen 08/27/2017, 10:35 AMImportant Message  Patient Details  Name: JOANY KHATIB MRN: [id number] Date of Birth: [birth year]   Medicare Important Message Given:  Yes    Kerin Salen 08/27/2017, 10:35 AM

## 2017-08-27 NOTE — Progress Notes (Signed)
PROGRESS NOTE    ADRIANNAH STEINKAMP  JHE:[id number] DOB: 1951-05-14 DOA: 08/23/2017 PCP: Glendale Chard, MD    Brief Narrative:  Patient is a pleasant 66 year old female history of hypertension, hyperlipidemia, arthritis, anxiety, diverticulitis present to the ED with a three-day history of left foot pain, swelling, warmth, tenderness to palpation. Patient was seen by orthopedic doctor started on some Bactrim however due to worsening symptoms patient was directly to the ED. Patient seen in the ED given some IV Ancef and admitted for an acute cellulitis/left foot pain. Due to ongoing pain MRI of the foot obtained were sent for myositis, joint effusion in the second and third toe MTP joint. Orthopedics consulted and joint effusion aspirated.   Assessment & Plan:   Principal Problem:   Cellulitis of foot Active Problems:   Hyperlipidemia   Hypertension   Constipation   Hypokalemia  #1 left foot pain/cellulitis versus gout versus pseudogout Patient presented with left foot pain/??cellulitis which had worsened over the past 24 hours prior to admission after being seen by her orthopedic doctor who subsequently sent her to the ED. Plain films of the left foot negative for any fracture. Patient still with significant left foot pain to the point where she is unable to ambulate or place pressure on her foot due to pain. Patient with worsening swelling in the left foot and erythema and pain and a such MRI obtained. MRI of the left foot ordered 08/24/2017 was subsequently done 08/26/2017 which showed moderate subcutaneous soft tissue swelling/edema/fluid and mainly involving the foot dorsally, moderate changes of myositis involving forefoot musculature, moderate size joint effusion involving second and third MTP joints without obvious erosions. Orthopedics was consulted and second toe MTP joint aspirated and sent for cell count, differential, cultures, Gram stain, crystals. Concern for possible gout versus  pseudogout. Patient with an allergy to NSAIDs. Patient states unable to take steroids as they make her manic/crazy. Patient has been started on colchicine 0.6 mg daily by orthopedics. Increase colchicine to 0.6 mg twice a day. Continue empiric oral Keflex to complete a seven-day course of antibiotic treatment. If continued improvement will need outpatient follow-up with orthopedics.  #2 hypokalemia Repleted.  #3 hypertension Blood pressure is borderline. Oral antihypertensive medications on hold.   #4 hyperlipidemia Continue Crestor.  #5 constipation Continue Senokot.  #6 vitamin D deficiency Continue oral vitamin D supplementation.   DVT prophylaxis: Lovenox Code Status: Full Family Communication: Updated patient. No family at bedside. Disposition Plan: Likely home the next 24 hours if continued clinical improvement and improvement with left foot pain.   Consultants:   Orthopedics: Joanell Rising, PA  Procedures:   Plain films of the left foot 08/23/2017  MRI left foot 08/26/2017  Antimicrobials:   IV Ancef 08/23/2017>>>>> 08/25/2017  Oral Keflex 08/25/2017>>>>> 08/30/2017   Subjective: Patient states some improvement with swelling and erythema and pain. Patient seen by orthopedic PA who aspirated MTP joints of the second toe.   Objective: Vitals:   08/26/17 0453 08/26/17 1445 08/26/17 2041 08/27/17 0510  BP: 103/67 109/81 109/67 109/67  Pulse: 92 97 86 78  Resp: 15 16 15 15   Temp: 98.5 F (36.9 C) 98.4 F (36.9 C) 98.2 F (36.8 C) 98.5 F (36.9 C)  TempSrc: Oral Oral Oral Oral  SpO2: 95% 100% 100% 97%  Weight:      Height:        Intake/Output Summary (Last 24 hours) at 08/27/2017 1344 Last data filed at 08/27/2017 1300 Gross per 24 hour  Intake  1440 ml  Output -  Net 1440 ml   Filed Weights   08/23/17 1213 08/23/17 2030 08/25/17 0500  Weight: 69.9 kg (154 lb) 69.7 kg (153 lb 10.6 oz) 71.4 kg (157 lb 6.5 oz)    Examination:  General exam:  NAD. Respiratory system: Clear to auscultation bilaterally. No wheezes, no crackles, no rhonchi. Respiratory effort normal. Cardiovascular system: Regular rate rhythm no murmurs rubs or gallops. No JVD.  Gastrointestinal system: Abdomen is soft, nontender, nondistended, positive bowel sounds.  Central nervous system: Alert and oriented. No focal neurological deficits. Extremities: Left foot swelling improving, less erythema, less tenderness to palpation. Band-Aid on foot. Symmetric 5 x 5 power. Skin: Left foot with less erythema, swelling and less tender to palpation.  Psychiatry: Judgement and insight appear normal. Mood & affect appropriate.     Data Reviewed: I have personally reviewed following labs and imaging studies  CBC: Recent Labs  Lab 08/23/17 1556 08/24/17 0704 08/25/17 0622 08/27/17 0603  WBC 6.8 6.9 6.5 5.6  NEUTROABS 3.9  --  3.5 3.3  HGB 12.6 11.7* 11.7* 11.4*  HCT 37.8 35.9* 35.7* 34.9*  MCV 89.6 91.1 91.1 90.4  PLT 204 197 211 270   Basic Metabolic Panel: Recent Labs  Lab 08/23/17 1556 08/24/17 0704 08/25/17 0622 08/26/17 0547 08/27/17 0603  NA 136 139 139 137 139  K 3.0* 4.3 5.0 3.6 4.3  CL 97* 105 105 102 103  CO2 28 24 27 26 25   GLUCOSE 131* 109* 117* 134* 119*  BUN 13 9 6 8 7   CREATININE 0.91 0.88 0.90 0.81 0.72  CALCIUM 9.1 8.7* 9.0 9.1 9.0  MG 2.1  --   --   --   --    GFR: Estimated Creatinine Clearance: 60.2 mL/min (by C-G formula based on SCr of 0.72 mg/dL). Liver Function Tests: No results for input(s): AST, ALT, ALKPHOS, BILITOT, PROT, ALBUMIN in the last 168 hours. No results for input(s): LIPASE, AMYLASE in the last 168 hours. No results for input(s): AMMONIA in the last 168 hours. Coagulation Profile: No results for input(s): INR, PROTIME in the last 168 hours. Cardiac Enzymes: No results for input(s): CKTOTAL, CKMB, CKMBINDEX, TROPONINI in the last 168 hours. BNP (last 3 results) No results for input(s): PROBNP in the last 8760  hours. HbA1C: No results for input(s): HGBA1C in the last 72 hours. CBG: No results for input(s): GLUCAP in the last 168 hours. Lipid Profile: No results for input(s): CHOL, HDL, LDLCALC, TRIG, CHOLHDL, LDLDIRECT in the last 72 hours. Thyroid Function Tests: No results for input(s): TSH, T4TOTAL, FREET4, T3FREE, THYROIDAB in the last 72 hours. Anemia Panel: No results for input(s): VITAMINB12, FOLATE, FERRITIN, TIBC, IRON, RETICCTPCT in the last 72 hours. Sepsis Labs: No results for input(s): PROCALCITON, LATICACIDVEN in the last 168 hours.  Recent Results (from the past 240 hour(s))  Blood culture (routine x 2)     Status: None (Preliminary result)   Collection Time: 08/23/17  4:10 PM  Result Value Ref Range Status   Specimen Description BLOOD RIGHT WRIST  Final   Special Requests   Final    BOTTLES DRAWN AEROBIC AND ANAEROBIC Blood Culture adequate volume   Culture   Final    NO GROWTH 4 DAYS Performed at [facility], [address] [place], [place] [address]    Report Status PENDING  Incomplete  Blood culture (routine x 2)     Status: None (Preliminary result)   Collection Time: 08/23/17  4:20  PM  Result Value Ref Range Status   Specimen Description BLOOD LEFT ANTECUBITAL  Final   Special Requests   Final    BOTTLES DRAWN AEROBIC AND ANAEROBIC Blood Culture adequate volume   Culture   Final    NO GROWTH 4 DAYS Performed at [facility], [address] [place], [place] [address]    Report Status PENDING  Incomplete         Radiology Studies: Mr Foot Left Wo Contrast  Result Date: 08/26/2017 CLINICAL DATA:  Diffuse left ankle and foot pain. EXAM: MRI OF THE LEFT FOOT WITHOUT CONTRAST TECHNIQUE: Multiplanar, multisequence MR imaging of the ankle was performed. No intravenous contrast was administered. COMPARISON:  Radiographs 08/23/2017 FINDINGS: TENDONS Peroneal: Intact Posteromedial: Intact Anterior: Intact Achilles: Mild tendinopathy. Plantar Fascia:  Intact LIGAMENTS Lateral: Intact Medial: Intact CARTILAGE Ankle Joint: Mild degenerative changes with small joint effusion. No osteochondral lesion. Subtalar Joints/Sinus Tarsi: The subtalar joints are maintained. No significant degenerative changes or erosions. The sinus tarsi appears normal. The cervical and interosseous ligaments are intact. There are no joint effusions involving the second and third MTP joints without obvious erosive changes. Mild degenerative changes and hallux valgus deformity at the first MTP joint. Bones: No stress fracture or AVN. Other: Moderate edema like signal abnormality involving the forefoot musculature suggesting nonspecific myositis. There is also moderate subcutaneous soft tissue swelling/ edema/ fluid mainly along the dorsum of the foot. IMPRESSION: 1. Nonspecific moderate subcutaneous soft tissue swelling/edema/fluid mainly involving the foot dorsally. 2. Moderate changes of myositis involving the forefoot musculature. 3. Moderate-sized joint effusion involving the second and third MTP joints without obvious erosions. 4. No stress fracture or osteochondral lesions. 5. Intact medial and lateral ankle ligaments and tendons. Electronically Signed   By: Marijo Sanes M.D.   On: 08/26/2017 15:33        Scheduled Meds: . cephALEXin  500 mg Oral Q8H  . cholecalciferol  1,000 Units Oral Daily  . colchicine  0.6 mg Oral Daily  . enoxaparin (LOVENOX) injection  40 mg Subcutaneous Q24H  . pantoprazole  40 mg Oral Daily  . rosuvastatin  10 mg Oral QPM  . senna  1 tablet Oral BID  . sodium chloride flush  3 mL Intravenous Q12H   Continuous Infusions:    LOS: 3 days    Time spent: 35 minutes    Madelina Sanda, MD [facility] Pager [phone]  If 7PM-7AM, please contact night-coverage [email or web address] Password [email or web address] 08/27/2017, 1:44 PM

## 2017-08-27 NOTE — Progress Notes (Signed)
PROGRESS NOTE    [name]  XLK:[id number] DOB: 08-14-1951 DOA: 08/23/2017 PCP: Glendale Chard, MD    Brief Narrative:  Patient is a pleasant 66 year old female history of hypertension, hyperlipidemia, arthritis, anxiety, diverticulitis present to the ED with a three-day history of left foot pain, swelling, warmth, tenderness to palpation. Patient was seen by orthopedic doctor started on some Bactrim however due to worsening symptoms patient was directly to the ED. Patient seen in the ED given some IV Ancef and admitted for an acute cellulitis/left foot pain. Due to ongoing pain MRI of the foot obtained were sent for myositis, joint effusion in the second and third toe MTP joint. Orthopedics consulted and joint effusion aspirated.   Assessment & Plan:   Principal Problem:   Cellulitis of foot Active Problems:   Hyperlipidemia   Hypertension   Constipation   Hypokalemia  #1 left foot pain/cellulitis versus gout versus pseudogout Patient presented with left foot pain/??cellulitis which had worsened over the past 24 hours prior to admission after being seen by her orthopedic doctor who subsequently sent her to the ED. Plain films of the left foot negative for any fracture. Patient still with significant left foot pain to the point where she is unable to ambulate or place pressure on her foot due to pain. Patient with worsening swelling in the left foot and erythema and pain and a such MRI obtained. MRI of the left foot ordered 08/24/2017 was subsequently done 08/26/2017 which showed moderate subcutaneous soft tissue swelling/edema/fluid and mainly involving the foot dorsally, moderate changes of myositis involving forefoot musculature, moderate size joint effusion involving second and third MTP joints without obvious erosions. Orthopedics was consulted and second toe MTP joint aspirated and sent for cell count, differential, cultures, Gram stain, crystals. Concern for possible gout versus  pseudogout. Patient with an allergy to NSAIDs. Patient states unable to take steroids as they make her manic/crazy. Patient has been started on colchicine 0.6 mg daily by orthopedics. Increase colchicine to 0.6 mg twice a day. Continue empiric oral Keflex to complete a seven-day course of antibiotic treatment. If continued improvement will need outpatient follow-up with orthopedics.  #2 hypokalemia Repleted.  #3 hypertension Blood pressure is borderline. Oral antihypertensive medications on hold.   #4 hyperlipidemia Continue Crestor.  #5 constipation Continue Senokot.  #6 vitamin D deficiency Continue oral vitamin D supplementation.   DVT prophylaxis: Lovenox Code Status: Full Family Communication: Updated patient. No family at bedside. Disposition Plan: Likely home the next 24 hours if continued clinical improvement and improvement with left foot pain.   Consultants:   Orthopedics: Joanell Rising, PA  Procedures:   Plain films of the left foot 08/23/2017  MRI left foot 08/26/2017  Antimicrobials:   IV Ancef 08/23/2017>>>>> 08/25/2017  Oral Keflex 08/25/2017>>>>> 08/30/2017   Subjective: Patient states some improvement with swelling and erythema and pain. Patient seen by orthopedic PA who aspirated MTP joints of the second toe.   Objective: Vitals:   08/26/17 2041 08/27/17 0510 08/27/17 1430 08/27/17 1915  BP: 109/67 109/67 109/67 124/76  Pulse: 86 78 80 90  Resp: 15 15 16 14   Temp: 98.2 F (36.8 C) 98.5 F (36.9 C) 98.1 F (36.7 C) 99.3 F (37.4 C)  TempSrc: Oral Oral Oral Oral  SpO2: 100% 97% 98% 100%  Weight:      Height:        Intake/Output Summary (Last 24 hours) at 08/27/2017 2205 Last data filed at 08/27/2017 1715 Gross per 24 hour  Intake  1200 ml  Output -  Net 1200 ml   Filed Weights   08/23/17 1213 08/23/17 2030 08/25/17 0500  Weight: 69.9 kg (154 lb) 69.7 kg (153 lb 10.6 oz) 71.4 kg (157 lb 6.5 oz)    Examination:  General exam:  NAD. Respiratory system: Clear to auscultation bilaterally. No wheezes, no crackles, no rhonchi. Respiratory effort normal. Cardiovascular system: Regular rate rhythm no murmurs rubs or gallops. No JVD.  Gastrointestinal system: Abdomen is soft, nontender, nondistended, positive bowel sounds.  Central nervous system: Alert and oriented. No focal neurological deficits. Extremities: Left foot swelling improving, less erythema, less tenderness to palpation. Band-Aid on foot. Symmetric 5 x 5 power. Skin: Left foot with less erythema, swelling and less tender to palpation.  Psychiatry: Judgement and insight appear normal. Mood & affect appropriate.     Data Reviewed: I have personally reviewed following labs and imaging studies  CBC: Recent Labs  Lab 08/23/17 1556 08/24/17 0704 08/25/17 0622 08/27/17 0603  WBC 6.8 6.9 6.5 5.6  NEUTROABS 3.9  --  3.5 3.3  HGB 12.6 11.7* 11.7* 11.4*  HCT 37.8 35.9* 35.7* 34.9*  MCV 89.6 91.1 91.1 90.4  PLT 204 197 211 295   Basic Metabolic Panel: Recent Labs  Lab 08/23/17 1556 08/24/17 0704 08/25/17 0622 08/26/17 0547 08/27/17 0603  NA 136 139 139 137 139  K 3.0* 4.3 5.0 3.6 4.3  CL 97* 105 105 102 103  CO2 28 24 27 26 25   GLUCOSE 131* 109* 117* 134* 119*  BUN 13 9 6 8 7   CREATININE 0.91 0.88 0.90 0.81 0.72  CALCIUM 9.1 8.7* 9.0 9.1 9.0  MG 2.1  --   --   --   --    GFR: Estimated Creatinine Clearance: 60.2 mL/min (by C-G formula based on SCr of 0.72 mg/dL). Liver Function Tests: No results for input(s): AST, ALT, ALKPHOS, BILITOT, PROT, ALBUMIN in the last 168 hours. No results for input(s): LIPASE, AMYLASE in the last 168 hours. No results for input(s): AMMONIA in the last 168 hours. Coagulation Profile: No results for input(s): INR, PROTIME in the last 168 hours. Cardiac Enzymes: No results for input(s): CKTOTAL, CKMB, CKMBINDEX, TROPONINI in the last 168 hours. BNP (last 3 results) No results for input(s): PROBNP in the last 8760  hours. HbA1C: No results for input(s): HGBA1C in the last 72 hours. CBG: No results for input(s): GLUCAP in the last 168 hours. Lipid Profile: No results for input(s): CHOL, HDL, LDLCALC, TRIG, CHOLHDL, LDLDIRECT in the last 72 hours. Thyroid Function Tests: No results for input(s): TSH, T4TOTAL, FREET4, T3FREE, THYROIDAB in the last 72 hours. Anemia Panel: No results for input(s): VITAMINB12, FOLATE, FERRITIN, TIBC, IRON, RETICCTPCT in the last 72 hours. Sepsis Labs: No results for input(s): PROCALCITON, LATICACIDVEN in the last 168 hours.  Recent Results (from the past 240 hour(s))  Blood culture (routine x 2)     Status: None (Preliminary result)   Collection Time: 08/23/17  4:10 PM  Result Value Ref Range Status   Specimen Description BLOOD RIGHT WRIST  Final   Special Requests   Final    BOTTLES DRAWN AEROBIC AND ANAEROBIC Blood Culture adequate volume   Culture   Final    NO GROWTH 4 DAYS Performed at [facility], [address] [place], [place] [address]    Report Status PENDING  Incomplete  Blood culture (routine x 2)     Status: None (Preliminary result)   Collection Time: 08/23/17  4:20  PM  Result Value Ref Range Status   Specimen Description BLOOD LEFT ANTECUBITAL  Final   Special Requests   Final    BOTTLES DRAWN AEROBIC AND ANAEROBIC Blood Culture adequate volume   Culture   Final    NO GROWTH 4 DAYS Performed at [facility], [address], [place], [place] [address]    Report Status PENDING  Incomplete         Radiology Studies: Mr Foot Left Wo Contrast  Result Date: 08/26/2017 CLINICAL DATA:  Diffuse left ankle and foot pain. EXAM: MRI OF THE LEFT FOOT WITHOUT CONTRAST TECHNIQUE: Multiplanar, multisequence MR imaging of the ankle was performed. No intravenous contrast was administered. COMPARISON:  Radiographs 08/23/2017 FINDINGS: TENDONS Peroneal: Intact Posteromedial: Intact Anterior: Intact Achilles: Mild tendinopathy. Plantar Fascia:  Intact LIGAMENTS Lateral: Intact Medial: Intact CARTILAGE Ankle Joint: Mild degenerative changes with small joint effusion. No osteochondral lesion. Subtalar Joints/Sinus Tarsi: The subtalar joints are maintained. No significant degenerative changes or erosions. The sinus tarsi appears normal. The cervical and interosseous ligaments are intact. There are no joint effusions involving the second and third MTP joints without obvious erosive changes. Mild degenerative changes and hallux valgus deformity at the first MTP joint. Bones: No stress fracture or AVN. Other: Moderate edema like signal abnormality involving the forefoot musculature suggesting nonspecific myositis. There is also moderate subcutaneous soft tissue swelling/ edema/ fluid mainly along the dorsum of the foot. IMPRESSION: 1. Nonspecific moderate subcutaneous soft tissue swelling/edema/fluid mainly involving the foot dorsally. 2. Moderate changes of myositis involving the forefoot musculature. 3. Moderate-sized joint effusion involving the second and third MTP joints without obvious erosions. 4. No stress fracture or osteochondral lesions. 5. Intact medial and lateral ankle ligaments and tendons. Electronically Signed   By: Marijo Sanes M.D.   On: 08/26/2017 15:33        Scheduled Meds: . cephALEXin  500 mg Oral Q8H  . cholecalciferol  1,000 Units Oral Daily  . colchicine  0.6 mg Oral BID  . enoxaparin (LOVENOX) injection  40 mg Subcutaneous Q24H  . pantoprazole  40 mg Oral Daily  . rosuvastatin  10 mg Oral QPM  . senna  1 tablet Oral BID  . sodium chloride flush  3 mL Intravenous Q12H   Continuous Infusions:    LOS: 3 days    Time spent: 35 minutes    Sharece Fleischhacker, MD [facility] Pager [phone]  If 7PM-7AM, please contact night-coverage [email or web address] Password [email or web address] 08/27/2017, 10:05 PM

## 2017-08-27 NOTE — Consult Note (Signed)
Reason for Consult:Left foot pain  Referring Physician: Ora Bollig is an 66 y.o. female.  HPI: Patient is a pleasant 66 year old female history of hypertension, hyperlipidemia, arthritis, anxiety, diverticulitis present to the ED with a three-day history of left foot pain, swelling, warmth, tenderness to palpation. Patient was seen on Thursday by Joanell Rising PA-C and started on some Bactrim and Celebrex. She was then seen again on Friday, however due to worsening symptoms patient was directed to the ED. Patient seen in the ED given some IV Ancef and admitted for an acute cellulitis/left foot pain.  Patient had an MRI that has been reviewed and shows swelling over the 2nd and 3rd MTP joint as well asselling int he mid foot.  No cellulitis or myelitis.    Past Medical History:  Diagnosis Date  . Anxiety    takes Xanax prn  . Arthritis   . Diverticulosis   . H/O blood transfusion reaction 2010   hives  . H/O migraine    last 1yr ago  . Headache(784.0)   . Heart murmur    takes Bystolic nightly  . History of blood clots 462yr  was on Lovenox injections and Coumadin(was only on that for short period of time)  . History of shingles [id number]  . Hx of seasonal allergies    takes Levocetirizine prn  . Hyperlipidemia    takes Crestor nightly  . Hypertension    takes Maxzide daily  . Insomnia    takes Ambien prn  . Joint pain   . Joint swelling   . Trigeminal neuralgia of right side of face 03/26/2016   V2 distribution    Past Surgical History:  Procedure Laterality Date  . ABDOMINAL HYSTERECTOMY    . BREAST BIOPSY  05/03/2006   US US BREAST REDUCTION SURGERY  1987  . CHOLECYSTECTOMY    . COLONOSCOPY    . ESOPHAGOGASTRODUODENOSCOPY    . EYE SURGERY Right    eye lash removed  . JOINT REPLACEMENT     bilateral hip rt in 2007 and lt in 2010  . NM MYOCAR PERF WALL MOTION  11/04/2008   Normal  . REDUCTION MAMMAPLASTY Bilateral 1988  . REDUCTION MAMMAPLASTY       Family History  Problem Relation Age of Onset  . Heart attack Mother   . Alzheimer's disease Mother   . Diabetes Sister   . Hypertension Sister     Social History:  reports that she is a non-smoker but has been exposed to tobacco smoke. She has been exposed to tobacco smoke for the past 42.00 years. she has never used smokeless tobacco. She reports that she drinks alcohol. She reports that she does not use drugs.  Allergies:  Allergies  Allergen Reactions  . Ibuprofen Hives and Swelling  . Penicillins Hives and Swelling    Has patient had a PCN reaction causing immediate rash, facial/tongue/throat swelling, SOB or lightheadedness with hypotension: yes Has patient had a PCN reaction causing severe rash involving mucus membranes or skin necrosis: no Has patient had a PCN reaction that required hospitalization: no Has patient had a PCN reaction occurring within the last 10 years: no If all of the above answers are "NO", then may proceed with Cephalosporin use.   . Latex Itching    Medications: I have reviewed the patient's current medications.  Results for orders placed or performed during the hospital encounter of 08/23/17 (from the past 48 hour(s))  Basic metabolic panel  Status: Abnormal   Collection Time: 08/26/17  5:47 AM  Result Value Ref Range   Sodium 137 135 - 145 mmol/L   Potassium 3.6 3.5 - 5.1 mmol/L    Comment: DELTA CHECK NOTED REPEATED TO VERIFY    Chloride 102 101 - 111 mmol/L   CO2 26 22 - 32 mmol/L   Glucose, Bld 134 (H) 65 - 99 mg/dL   BUN 8 6 - 20 mg/dL   Creatinine, Ser 0.81 0.44 - 1.00 mg/dL   Calcium 9.1 8.9 - 10.3 mg/dL   GFR calc non Af Amer >60 >60 mL/min   GFR calc Af Amer >60 >60 mL/min    Comment: (NOTE) The eGFR has been calculated using the CKD EPI equation. This calculation has not been validated in all clinical situations. eGFR's persistently <60 mL/min signify possible Chronic Kidney Disease.    Anion gap 9 5 - 15  Basic  metabolic panel     Status: Abnormal   Collection Time: 08/27/17  6:03 AM  Result Value Ref Range   Sodium 139 135 - 145 mmol/L   Potassium 4.3 3.5 - 5.1 mmol/L   Chloride 103 101 - 111 mmol/L   CO2 25 22 - 32 mmol/L   Glucose, Bld 119 (H) 65 - 99 mg/dL   BUN 7 6 - 20 mg/dL   Creatinine, Ser 0.72 0.44 - 1.00 mg/dL   Calcium 9.0 8.9 - 10.3 mg/dL   GFR calc non Af Amer >60 >60 mL/min   GFR calc Af Amer >60 >60 mL/min    Comment: (NOTE) The eGFR has been calculated using the CKD EPI equation. This calculation has not been validated in all clinical situations. eGFR's persistently <60 mL/min signify possible Chronic Kidney Disease.    Anion gap 11 5 - 15  CBC with Differential/Platelet     Status: Abnormal   Collection Time: 08/27/17  6:03 AM  Result Value Ref Range   WBC 5.6 4.0 - 10.5 K/uL   RBC 3.86 (L) 3.87 - 5.11 MIL/uL   Hemoglobin 11.4 (L) 12.0 - 15.0 g/dL   HCT 34.9 (L) 36.0 - 46.0 %   MCV 90.4 78.0 - 100.0 fL   MCH 29.5 26.0 - 34.0 pg   MCHC 32.7 30.0 - 36.0 g/dL   RDW 15.0 11.5 - 15.5 %   Platelets 231 150 - 400 K/uL   Neutrophils Relative % 59 %   Neutro Abs 3.3 1.7 - 7.7 K/uL   Lymphocytes Relative 25 %   Lymphs Abs 1.4 0.7 - 4.0 K/uL   Monocytes Relative 15 %   Monocytes Absolute 0.8 0.1 - 1.0 K/uL   Eosinophils Relative 1 %   Eosinophils Absolute 0.1 0.0 - 0.7 K/uL   Basophils Relative 0 %   Basophils Absolute 0.0 0.0 - 0.1 K/uL    Mr Foot Left Wo Contrast  Result Date: 08/26/2017 CLINICAL DATA:  Diffuse left ankle and foot pain. EXAM: MRI OF THE LEFT FOOT WITHOUT CONTRAST TECHNIQUE: Multiplanar, multisequence MR imaging of the ankle was performed. No intravenous contrast was administered. COMPARISON:  Radiographs 08/23/2017 FINDINGS: TENDONS Peroneal: Intact Posteromedial: Intact Anterior: Intact Achilles: Mild tendinopathy. Plantar Fascia: Intact LIGAMENTS Lateral: Intact Medial: Intact CARTILAGE Ankle Joint: Mild degenerative changes with small joint  effusion. No osteochondral lesion. Subtalar Joints/Sinus Tarsi: The subtalar joints are maintained. No significant degenerative changes or erosions. The sinus tarsi appears normal. The cervical and interosseous ligaments are intact. There are no joint effusions involving the second and third MTP  joints without obvious erosive changes. Mild degenerative changes and hallux valgus deformity at the first MTP joint. Bones: No stress fracture or AVN. Other: Moderate edema like signal abnormality involving the forefoot musculature suggesting nonspecific myositis. There is also moderate subcutaneous soft tissue swelling/ edema/ fluid mainly along the dorsum of the foot. IMPRESSION: 1. Nonspecific moderate subcutaneous soft tissue swelling/edema/fluid mainly involving the foot dorsally. 2. Moderate changes of myositis involving the forefoot musculature. 3. Moderate-sized joint effusion involving the second and third MTP joints without obvious erosions. 4. No stress fracture or osteochondral lesions. 5. Intact medial and lateral ankle ligaments and tendons. Electronically Signed   By: Marijo Sanes M.D.   On: 08/26/2017 15:33    Review of Systems  Constitutional: Negative.  Negative for fever.  HENT: Negative.   Eyes: Negative.   Respiratory: Negative.   Cardiovascular: Negative.   Gastrointestinal: Negative.   Genitourinary: Negative.   Musculoskeletal: Positive for joint pain.  Skin: Negative.   Neurological: Negative.   Endo/Heme/Allergies: Negative.   Psychiatric/Behavioral: Negative.    Blood pressure 109/67, pulse 78, temperature 98.5 F (36.9 C), temperature source Oral, resp. rate 15, height 5' (1.524 m), weight 71.4 kg (157 lb 6.5 oz), SpO2 97 %. Physical Exam  Constitutional: She is oriented to person, place, and time. She appears well-developed and well-nourished.  HENT:  Head: Normocephalic and atraumatic.  Neck: Normal range of motion. Neck supple.  Cardiovascular: Intact distal pulses.   Respiratory: Effort normal.  Musculoskeletal: She exhibits tenderness.  Neurological: She is alert and oriented to person, place, and time.  Skin: Skin is warm and dry.  Psychiatric: She has a normal mood and affect. Her behavior is normal. Judgment and thought content normal.    Assessment/Plan: Left Foot pain/cellulitis - the patient's left foot was aspirated at the second MTP joint.  This was performed using sterile technique.  Approximately 1 cc of blood-tinged aspirate was obtained.  This will be sent off for cell count, cultures and sensitivity,Gram stain, and crystals.  I do suspect the patient has either gout or pseudogout.  Patient may follow-up with Dr. Mayer Camel in the outpatient setting as needed.  Gricelda Foland R 08/27/2017, 12:58 PM

## 2017-08-28 DIAGNOSIS — E7800 Pure hypercholesterolemia, unspecified: Secondary | ICD-10-CM

## 2017-08-28 LAB — BASIC METABOLIC PANEL
ANION GAP: 9 (ref 5–15)
BUN: 8 mg/dL (ref 6–20)
CHLORIDE: 104 mmol/L (ref 101–111)
CO2: 27 mmol/L (ref 22–32)
Calcium: 9.1 mg/dL (ref 8.9–10.3)
Creatinine, Ser: 0.81 mg/dL (ref 0.44–1.00)
GFR calc Af Amer: >60 mL/min (ref >60)
GLUCOSE: 113 mg/dL — AB (ref 65–99)
POTASSIUM: 4.1 mmol/L (ref 3.5–5.1)
SODIUM: 140 mmol/L (ref 135–145)

## 2017-08-28 LAB — CULTURE, BLOOD (ROUTINE X 2)
CULTURE: NO GROWTH
Culture: NO GROWTH
SPECIAL REQUESTS: ADEQUATE
Special Requests: ADEQUATE

## 2017-08-28 MED ORDER — METHYLPREDNISOLONE SODIUM SUCC 40 MG IJ SOLR
40.0000 mg | Freq: Once | INTRAMUSCULAR | Status: AC
  Administered 2017-08-28: 40 mg via INTRAVENOUS
  Filled 2017-08-28: qty 1

## 2017-08-28 NOTE — Progress Notes (Signed)
PROGRESS NOTE        PATIENT DETAILS Name: [name] Age: 66 y.o. Sex: female Date of Birth: [birth year] Admit Date: 08/23/2017 Admitting Physician Veronica Filler, MD [id number], Veronica Mech, MD  Brief Narrative: Patient is a 66 y.o. female with past medical history of hypertension, dyslipidemia admitted with left forefoot pain, swelling, erythema thought to have either cellulitis or crystal-induced arthropathy. See below for further details  Subjective: Left foot pain has improved but she is still having difficulty bearing weight. That area still pretty tender. No major swelling or erythema apparent.   Assessment/Plan: Left forefoot pain and swelling: Not sure at this time whether this is cellulitis or gout/pseudogout flare. She apparently is allergic to NSAIDs, and is very hesitant in taking steroids (apparently to prednisone a few weeks ago and was having weird thoughts). She currently is on colchicine-and Keflex-although improved, she still is having significant issues with weightbearing due to pain. After much discussion, she is agreeable to try 1 dose of IV Solu-Medrol to see if she has a marked response-continue to monitor closely, if she does well with steroids, I suspect she could be discharged home tomorrow morning. Note, orthopedics did do a arthrocentesis-but not enough fluid to do cell count crystals-cultures negative so far.  Hypertension: Blood pressure currently controlled-continue to hold all antihypertensives.  Dyslipidemia: Continue statin   DVT Prophylaxis: Prophylactic Lovenox   Code Status: Full code   Family Communication: None at bedside  Disposition Plan: Remain inpatient-hopefully home tomorrow  Antimicrobial agents: Anti-infectives (From admission, onward)   Start     Dose/Rate Route Frequency Ordered Stop   08/25/17 0830  cephALEXin (KEFLEX) capsule 500 mg     500 mg Oral Every 8 hours 08/25/17 0828     08/23/17 2330   ceFAZolin (ANCEF) IVPB 2g/100 mL premix  Status:  Discontinued     2 g 200 mL/hr over 30 Minutes Intravenous Every 8 hours 08/23/17 1841 08/25/17 0828   08/23/17 1445  ceFAZolin (ANCEF) IVPB 1 g/50 mL premix     1 g 100 mL/hr over 30 Minutes Intravenous  Once 08/23/17 1438 08/23/17 1637      Procedures: 11/6>> arthrocentesis of second MTP  CONSULTS:  orthopedic surgery  Time spent: 25 minutes-Greater than 50% of this time was spent in counseling, explanation of diagnosis, planning of further management, and coordination of care.  MEDICATIONS: Scheduled Meds: . cephALEXin  500 mg Oral Q8H  . cholecalciferol  1,000 Units Oral Daily  . colchicine  0.6 mg Oral BID  . enoxaparin (LOVENOX) injection  40 mg Subcutaneous Q24H  . pantoprazole  40 mg Oral Daily  . rosuvastatin  10 mg Oral QPM  . senna  1 tablet Oral BID  . sodium chloride flush  3 mL Intravenous Q12H   Continuous Infusions: PRN Meds:.acetaminophen **OR** acetaminophen, dicyclomine, gadobenate dimeglumine, ondansetron **OR** ondansetron (ZOFRAN) IV, oxyCODONE, polyethylene glycol, sorbitol, zolpidem   PHYSICAL EXAM: Vital signs: Vitals:   08/27/17 0510 08/27/17 1430 08/27/17 1915 08/28/17 0550  BP: 109/67 109/67 124/76 107/73  Pulse: 78 80 90 75  Resp: 15 16 14 15   Temp: 98.5 F (36.9 C) 98.1 F (36.7 C) 99.3 F (37.4 C) 98.5 F (36.9 C)  TempSrc: Oral Oral Oral Oral  SpO2: 97% 98% 100% 96%  Weight:      Height:  Filed Weights   08/23/17 1213 08/23/17 2030 08/25/17 0500  Weight: 69.9 kg (154 lb) 69.7 kg (153 lb 10.6 oz) 71.4 kg (157 lb 6.5 oz)   Body mass index is 30.74 kg/m.   General appearance :Awake, alert, not in any distress. Speech Clear. Not toxic Looking Eyes:, pupils equally reactive to light and accomodation,no scleral icterus.Pink conjunctiva HEENT: Atraumatic and Normocephalic Neck: supple, no JVD. No cervical lymphadenopathy. No thyromegaly Resp:Good air entry bilaterally, no added  sounds  CVS: S1 S2 regular, no murmurs.  GI: Bowel sounds present, Non tender and not distended with no gaurding, rigidity or rebound.No organomegaly Extremities: B/L Lower Ext shows no edema, both legs are warm to touch. Mild erythema, minimal swelling and some moderate tenderness present in the left forefoot area. Neurology:  speech clear,Non focal, sensation is grossly intact. Psychiatric: Normal judgment and insight. Alert and oriented x 3. Normal mood. Musculoskeletal:No digital cyanosis Skin:No Rash, warm and dry Wounds:N/A  I have personally reviewed following labs and imaging studies  LABORATORY DATA: CBC: Recent Labs  Lab 08/23/17 1556 08/24/17 0704 08/25/17 0622 08/27/17 0603  WBC 6.8 6.9 6.5 5.6  NEUTROABS 3.9  --  3.5 3.3  HGB 12.6 11.7* 11.7* 11.4*  HCT 37.8 35.9* 35.7* 34.9*  MCV 89.6 91.1 91.1 90.4  PLT 204 197 211 431    Basic Metabolic Panel: Recent Labs  Lab 08/23/17 1556 08/24/17 0704 08/25/17 0622 08/26/17 0547 08/27/17 0603 08/28/17 0555  NA 136 139 139 137 139 140  K 3.0* 4.3 5.0 3.6 4.3 4.1  CL 97* 105 105 102 103 104  CO2 28 24 27 26 25 27   GLUCOSE 131* 109* 117* 134* 119* 113*  BUN 13 9 6 8 7 8   CREATININE 0.91 0.88 0.90 0.81 0.72 0.81  CALCIUM 9.1 8.7* 9.0 9.1 9.0 9.1  MG 2.1  --   --   --   --   --     GFR: Estimated Creatinine Clearance: 59.5 mL/min (by C-G formula based on SCr of 0.81 mg/dL).  Liver Function Tests: No results for input(s): AST, ALT, ALKPHOS, BILITOT, PROT, ALBUMIN in the last 168 hours. No results for input(s): LIPASE, AMYLASE in the last 168 hours. No results for input(s): AMMONIA in the last 168 hours.  Coagulation Profile: No results for input(s): INR, PROTIME in the last 168 hours.  Cardiac Enzymes: No results for input(s): CKTOTAL, CKMB, CKMBINDEX, TROPONINI in the last 168 hours.  BNP (last 3 results) No results for input(s): PROBNP in the last 8760 hours.  HbA1C: No results for input(s): HGBA1C in  the last 72 hours.  CBG: No results for input(s): GLUCAP in the last 168 hours.  Lipid Profile: No results for input(s): CHOL, HDL, LDLCALC, TRIG, CHOLHDL, LDLDIRECT in the last 72 hours.  Thyroid Function Tests: No results for input(s): TSH, T4TOTAL, FREET4, T3FREE, THYROIDAB in the last 72 hours.  Anemia Panel: No results for input(s): VITAMINB12, FOLATE, FERRITIN, TIBC, IRON, RETICCTPCT in the last 72 hours.  Urine analysis:    Component Value Date/Time   COLORURINE YELLOW 07/16/2017 [place] 07/16/2017 1527   LABSPEC 1.020 07/16/2017 1527   PHURINE 6.0 07/16/2017 1527   GLUCOSEU NEGATIVE 07/16/2017 1527   HGBUR NEGATIVE 07/16/2017 1527   BILIRUBINUR NEGATIVE 07/16/2017 1527   [place] 07/16/2017 1527   PROTEINUR NEGATIVE 07/16/2017 1527   UROBILINOGEN 0.2 03/03/2015 0510   NITRITE NEGATIVE 07/16/2017 1527   LEUKOCYTESUR NEGATIVE 07/16/2017 1527    Sepsis Labs:  Lactic Acid, Venous No results found for: LATICACIDVEN  MICROBIOLOGY: Recent Results (from the past 240 hour(s))  Blood culture (routine x 2)     Status: None   Collection Time: 08/23/17  4:10 PM  Result Value Ref Range Status   Specimen Description BLOOD RIGHT WRIST  Final   Special Requests   Final    BOTTLES DRAWN AEROBIC AND ANAEROBIC Blood Culture adequate volume   Culture   Final    NO GROWTH 5 DAYS Performed at [facility], [address], [place], [place] [address]    Report Status 08/28/2017 FINAL  Final  Blood culture (routine x 2)     Status: None   Collection Time: 08/23/17  4:20 PM  Result Value Ref Range Status   Specimen Description BLOOD LEFT ANTECUBITAL  Final   Special Requests   Final    BOTTLES DRAWN AEROBIC AND ANAEROBIC Blood Culture adequate volume   Culture   Final    NO GROWTH 5 DAYS Performed at [facility], [place] [address], [place], [place] [address]    Report Status 08/28/2017 FINAL  Final  Anaerobic culture     Status: None  (Preliminary result)   Collection Time: 08/27/17  2:20 PM  Result Value Ref Range Status   Specimen Description FOOT LEFT  Final   Special Requests Normal  Final   Gram Stain   Final    ABUNDANT WBC PRESENT, PREDOMINANTLY PMN NO ORGANISMS SEEN    Culture   Final    NO GROWTH < 24 HOURS Performed at [facility], [place] [address] [place], [place] [address]    Report Status PENDING  Incomplete    RADIOLOGY STUDIES/RESULTS: Mr Foot Left Wo Contrast  Result Date: 08/26/2017 CLINICAL DATA:  Diffuse left ankle and foot pain. EXAM: MRI OF THE LEFT FOOT WITHOUT CONTRAST TECHNIQUE: Multiplanar, multisequence MR imaging of the ankle was performed. No intravenous contrast was administered. COMPARISON:  Radiographs 08/23/2017 FINDINGS: TENDONS Peroneal: Intact Posteromedial: Intact Anterior: Intact Achilles: Mild tendinopathy. Plantar Fascia: Intact LIGAMENTS Lateral: Intact Medial: Intact CARTILAGE Ankle Joint: Mild degenerative changes with small joint effusion. No osteochondral lesion. Subtalar Joints/Sinus Tarsi: The subtalar joints are maintained. No significant degenerative changes or erosions. The sinus tarsi appears normal. The cervical and interosseous ligaments are intact. There are no joint effusions involving the second and third MTP joints without obvious erosive changes. Mild degenerative changes and hallux valgus deformity at the first MTP joint. Bones: No stress fracture or AVN. Other: Moderate edema like signal abnormality involving the forefoot musculature suggesting nonspecific myositis. There is also moderate subcutaneous soft tissue swelling/ edema/ fluid mainly along the dorsum of the foot. IMPRESSION: 1. Nonspecific moderate subcutaneous soft tissue swelling/edema/fluid mainly involving the foot dorsally. 2. Moderate changes of myositis involving the forefoot musculature. 3. Moderate-sized joint effusion involving the second and third MTP joints without obvious erosions. 4. No  stress fracture or osteochondral lesions. 5. Intact medial and lateral ankle ligaments and tendons. Electronically Signed   By: Marijo Sanes M.D.   On: 08/26/2017 15:33   Dg Foot Complete Left  Result Date: 08/23/2017 CLINICAL DATA:  Patient reports her left foot has been hurting and swelling onset of Monday. Saw her orthopedic yesterday and started on bactrim and celebrex PO. Patient reports no relief and was encouraged to come to the ER for IV antibiotics. EXAM: LEFT FOOT - COMPLETE 3+ VIEW COMPARISON:  None. FINDINGS: No fracture.  No bone lesion. There are  no areas of bone resorption to suggest osteomyelitis. Mild hallux valgus at the first metatarsophalangeal joint. Remaining joints are normally spaced and aligned. Mild forefoot soft tissue edema.  No soft tissue air. IMPRESSION: 1. No evidence of osteomyelitis.  No soft tissue air. 2. No fracture. Electronically Signed   By: Lajean Manes M.D.   On: 08/23/2017 15:27     LOS: 4 days   Oren Binet, MD  [facility] Pager:[phone]  If 7PM-7AM, please contact night-coverage [email or web address] Password [email or web address] 08/28/2017, 2:59 PM

## 2017-08-28 NOTE — Progress Notes (Signed)
Physical Therapy Treatment Patient Details Name: [name] MRN: [id number] DOB: 06/23/1951 Today's Date: 08/28/2017    History of Present Illness Pt is a 66 y.o. female with medical history significant for diverticulitis, hypertension, hyperlipidemia, anxiety, arthritis presented to the ED with 3 day history of left foot pain, swelling, warmth, tenderness to palpation.     PT Comments    Assisted with amb an increased distance.  Pt still requires use of walker to decrease pain L LE and increase safety.     Follow Up Recommendations  No PT follow up     Equipment Recommendations  None recommended by PT    Recommendations for Other Services       Precautions / Restrictions Precautions Precautions: Fall Precaution Comments: LLE cellulitis Restrictions Weight Bearing Restrictions: No Other Position/Activity Restrictions: WBAT    Mobility  Bed Mobility Overal bed mobility: Modified Independent             General bed mobility comments: only required increased time   Transfers Overall transfer level: Modified independent Equipment used: Rolling walker (2 wheeled) Transfers: Sit to/from [facility] Sit to Stand: Modified independent (Device/Increase time) Stand pivot transfers: Modified independent (Device/Increase time)       General transfer comment: increased time for safety  Ambulation/Gait Ambulation/Gait assistance: Supervision;Min guard Ambulation Distance (Feet): 130 Feet Assistive device: Rolling walker (2 wheeled) Gait Pattern/deviations: Decreased step length - left;Decreased stance time - left Gait velocity: decreased   General Gait Details: excessive WBing thru walker and increased time with <25% VC's safety with turns.     Stairs            Wheelchair Mobility    Modified Rankin (Stroke Patients Only)       Balance                                            Cognition Arousal/Alertness:  Awake/alert Behavior During Therapy: WFL for tasks assessed/performed Overall Cognitive Status: Within Functional Limits for tasks assessed                                        Exercises      General Comments        Pertinent Vitals/Pain Pain Assessment: Faces Faces Pain Scale: Hurts little more Pain Location: L foot  Pain Descriptors / Indicators: Throbbing Pain Intervention(s): Monitored during session    Home Living                      Prior Function            PT Goals (current goals can now be found in the care plan section) Progress towards PT goals: Progressing toward goals    Frequency    Min 3X/week      PT Plan Current plan remains appropriate    Co-evaluation              AM-PAC PT "6 Clicks" Daily Activity  Outcome Measure  Difficulty turning over in bed (including adjusting bedclothes, sheets and blankets)?: None Difficulty moving from lying on back to sitting on the side of the bed? : None Difficulty sitting down on and standing up from a chair with arms (e.g., wheelchair, bedside commode, etc,.)?: None Help needed moving  to and from a bed to chair (including a wheelchair)?: None Help needed walking in hospital room?: A Little Help needed climbing 3-5 steps with a railing? : A Little 6 Click Score: 22    End of Session Equipment Utilized During Treatment: Gait belt Activity Tolerance: Patient tolerated treatment well Patient left: in bed;with call bell/phone within reach Nurse Communication: Mobility status PT Visit Diagnosis: Difficulty in walking, not elsewhere classified (R26.2);Pain Pain - Right/Left: Left Pain - part of body: Ankle and joints of foot     Time: 1040-1057 PT Time Calculation (min) (ACUTE ONLY): 17 min  Charges:  $Gait Training: 8-22 mins                    G Codes:       Rica Koyanagi  PTA [facility]  Acute  Rehab Pager      [phone]

## 2017-08-29 MED ORDER — CEPHALEXIN 500 MG PO CAPS: 500.0000 mg | ORAL_CAPSULE | Freq: Three times a day (TID) | ORAL | 0 refills | Status: DC

## 2017-08-29 MED ORDER — METHYLPREDNISOLONE SODIUM SUCC 125 MG IJ SOLR
60.0000 mg | Freq: Once | INTRAMUSCULAR | Status: AC
  Administered 2017-08-29: 60 mg via INTRAVENOUS
  Filled 2017-08-29: qty 2

## 2017-08-29 MED ORDER — COLCHICINE 0.6 MG PO TABS: 0.6000 mg | ORAL_TABLET | Freq: Every day | ORAL | 0 refills | Status: DC

## 2017-08-29 MED ORDER — TRAMADOL HCL 50 MG PO TABS: 50.0000 mg | ORAL_TABLET | Freq: Four times a day (QID) | ORAL | 0 refills | Status: DC | PRN

## 2017-08-29 MED ORDER — PREDNISONE 10 MG PO TABS: ORAL_TABLET | ORAL | 0 refills | Status: DC

## 2017-08-29 NOTE — Progress Notes (Signed)
Physical Therapy Treatment Patient Details Name: [name] MRN: [id number] DOB: 07/29/1951 Today's Date: 08/29/2017    History of Present Illness Pt is a 66 y.o. female with medical history significant for diverticulitis, hypertension, hyperlipidemia, anxiety, arthritis presented to the ED with 3 day history of left foot pain, swelling, warmth, tenderness to palpation.     PT Comments    Ambulated 200 feet with SPC, VC's needed for technique and safety. Performed full flight of stairs with min guard and VC's for technique, see below. No complaints of increased pain due to walking. Positioned in bed. Patient has met all PT goals.  Follow Up Recommendations     none   Equipment Recommendations    already has   Recommendations for Other Services       Precautions / Restrictions Precautions Precautions: Fall Precaution Comments: LLE cellulitis Restrictions Weight Bearing Restrictions: No Other Position/Activity Restrictions: WBAT    Mobility  Bed Mobility Overal bed mobility: Modified Independent             General bed mobility comments: only required increased time  Transfers Overall transfer level: Modified independent Equipment used: Straight cane Transfers: Sit to/from Stand Sit to Stand: Supervision;Min guard         General transfer comment: 25% VC's for technique and hand placement with SPC   Ambulation/Gait Ambulation/Gait assistance: Supervision;Min guard Ambulation Distance (Feet): 200 Feet Assistive device: Straight cane;None Gait Pattern/deviations: Decreased step length - left;Step-through pattern Gait velocity: decreased Gait velocity interpretation: Below normal speed for age/gender General Gait Details: 25% VC's for technique and safety with SPC. went 10 feet with no AD with min guard   Stairs Stairs: Yes   Stair Management: One rail Left;Sideways;Alternating pattern;Forwards Number of Stairs: 12 General stair comments: performed  stairs with rail on L with ascent and R with descent. Performed with alternating step with 1 hand on rail on ascent, sideways with 2 hands on rail on descent.   Wheelchair Mobility    Modified Rankin (Stroke Patients Only)       Balance                                            Cognition Arousal/Alertness: Awake/alert Behavior During Therapy: WFL for tasks assessed/performed Overall Cognitive Status: Within Functional Limits for tasks assessed                                        Exercises      General Comments        Pertinent Vitals/Pain Pain Assessment: 0-10 Pain Score: 1  Pain Location: L foot  Pain Descriptors / Indicators: Throbbing Pain Intervention(s): Monitored during session;Repositioned    Home Living                      Prior Function            PT Goals (current goals can now be found in the care plan section)      Frequency    Min 3X/week      PT Plan      Co-evaluation              AM-PAC PT "6 Clicks" Daily Activity  Outcome Measure  Difficulty turning over in bed (including adjusting  bedclothes, sheets and blankets)?: None Difficulty moving from lying on back to sitting on the side of the bed? : None Difficulty sitting down on and standing up from a chair with arms (e.g., wheelchair, bedside commode, etc,.)?: None Help needed moving to and from a bed to chair (including a wheelchair)?: None Help needed walking in hospital room?: A Little Help needed climbing 3-5 steps with a railing? : A Little 6 Click Score: 22    End of Session Equipment Utilized During Treatment: Gait belt Activity Tolerance: Patient tolerated treatment well Patient left: in bed;with call bell/phone within reach Nurse Communication: Mobility status PT Visit Diagnosis: Difficulty in walking, not elsewhere classified (R26.2);Pain Pain - Right/Left: Left Pain - part of body: Ankle and joints of foot      Time: 1000-1017 PT Time Calculation (min) (ACUTE ONLY): 17 min  Charges:                       G Codes:       Almond Lint, SPTA [facility] [place]  PTA [facility]  Acute  Rehab Pager      [phone]

## 2017-08-30 NOTE — Discharge Summary (Signed)
PATIENT DETAILS Name: [name] Age: 66 y.o. Sex: female Date of Birth: July 31, 1951 MRN: [id number]. Admitting Physician: Eugenie Filler, MD [id number], Bailey Mech, MD  Admit Date: 08/23/2017 Discharge date: 08/29/2017  Recommendations for Outpatient Follow-up:  1. Follow up with PCP in 1-2 weeks 2. Please obtain BMP/CBC in one week  Admitted From:  Home  Disposition: Home with home health services   [place]:  Yes  Equipment/Devices: None  Discharge Condition: Stable  CODE STATUS: FULL CODE  Diet recommendation:  Heart Healthy /  Brief Summary: See H&P, Labs, Consult and Test reports for all details in brief, See H&P, Labs, Consult and Test reports for all details in brief, Patient is a68 y.o.femalewith past medical history of hypertension, dyslipidemia admitted with left forefoot pain, swelling, erythema thought to have either cellulitis or crystal-induced arthropathy. See below for further details  Brief Hospital Course: Left forefoot pain and swelling: Suspicion for either cellulitis or gout/pseudogout flare, she apparently is allergic to NSAIDs and was very hesitant in taking steroids as it made her hyper and having weird thoughts.  She was managed with empiric antimicrobial therapy, after orthopedic consultation a arthrocentesis of the second MTP joint was done, it yielded very minimal fluid-only enough to do cultures, cultures were negative.  She was started on colchicine, after further discussion with the patient, she was agreeable to try steroids while inpatient.  First dose of steroid was given on 11/7-this morning pain has markedly improved, she is not able to stand up and walk on her own.  Mild tenderness persists on the left forefoot on moderate palpation/squeezing.  We will give her another dose of steroids today, and discharged on a 3-day course of prednisone (patient is agreeable), we would also continue Keflex for 3 more days as well.  She was asked  to follow-up with her regular doctor/PCP for further continued care.  Given the possibility of gout due to rapid improvement after initiation of steroids, I think it is reasonable to discontinue her diuretic therapy.  Her blood pressure in the hospital was relatively well controlled, we will resume Bystolic on discharge.  Hypertension: Blood pressure currently controlled-hold diuretics due to concern for gout-continue Bystolic.    Dyslipidemia: Continue statin  Procedures/Studies: 11/6>> arthrocentesis of second MTP  Discharge Diagnoses:  Principal Problem:   Cellulitis of foot Active Problems:   Hyperlipidemia   Hypertension   Constipation   Hypokalemia   Discharge Instructions:  Activity:  As tolerated with Full fall precautions use walker/cane & assistance as needed   Discharge Instructions    Diet - low sodium heart healthy   Complete by:  As directed    Increase activity slowly   Complete by:  As directed      Allergies as of 08/29/2017      Reactions   Ibuprofen Hives, Swelling   Penicillins Hives, Swelling   Latex Itching      Medication List    STOP taking these medications   celecoxib 200 MG capsule Commonly known as:  CELEBREX   triamterene-hydrochlorothiazide 37.5-25 MG capsule Commonly known as:  DYAZIDE     TAKE these medications   BEPREVE 1.5 % Soln Generic drug:  Bepotastine Besilate Place 1 drop into both eyes 2 (two) times daily as needed (allergies).   cephALEXin 500 MG capsule Commonly known as:  KEFLEX Take 1 capsule (500 mg total) every 8 (eight) hours by mouth.   cholecalciferol 1000 units tablet Commonly known as:  VITAMIN D Take 1,000  Units by mouth daily.   colchicine 0.6 MG tablet Take 1 tablet (0.6 mg total) daily by mouth.   DEXILANT 60 MG capsule Generic drug:  dexlansoprazole Take 1 capsule by mouth daily.   dicyclomine 20 MG tablet Commonly known as:  BENTYL Take 1 tablet (20 mg total) by mouth 2 (two) times daily  as needed for spasms (abdominal pain).   nebivolol 5 MG tablet Commonly known as:  BYSTOLIC Take 5 mg by mouth every evening. For HTN   ondansetron 4 MG disintegrating tablet Commonly known as:  ZOFRAN ODT Take 1 tablet (4 mg total) by mouth every 8 (eight) hours as needed for nausea or vomiting.   predniSONE 10 MG tablet Commonly known as:  DELTASONE Take 4 tablets (40 mg) daily for 1 day, then, Take 3 tablets (30 mg) daily for 1 days, then, Take 2 tablets (20 mg) daily for 1 days, then stop   rosuvastatin 10 MG tablet Commonly known as:  CRESTOR Take 10 mg by mouth every evening. For hyperlipidemia   traMADol 50 MG tablet Commonly known as:  ULTRAM Take 1 tablet (50 mg total) every 6 (six) hours as needed by mouth for moderate pain.   TYLENOL SINUS SEVERE 5-325-200 MG Tabs Generic drug:  Phenylephrine-APAP-Guaifenesin Take 2 tablets by mouth every 6 (six) hours as needed (for cold).   zolpidem 5 MG tablet Commonly known as:  AMBIEN Take 5 mg by mouth at bedtime as needed for sleep.      Follow-up Information    Glendale Chard, MD. Schedule an appointment as soon as possible for a visit in 1 week(s).   Specialty:  Internal Medicine Contact information: [address] [place]  [address] [phone]          Allergies  Allergen Reactions  . Ibuprofen Hives and Swelling  . Penicillins Hives and Swelling  . Latex Itching    Consultations:   orthopedic surgery  Other Procedures/Studies: Mr Foot Left Wo Contrast  Result Date: 08/26/2017 CLINICAL DATA:  Diffuse left ankle and foot pain. EXAM: MRI OF THE LEFT FOOT WITHOUT CONTRAST TECHNIQUE: Multiplanar, multisequence MR imaging of the ankle was performed. No intravenous contrast was administered. COMPARISON:  Radiographs 08/23/2017 FINDINGS: TENDONS Peroneal: Intact Posteromedial: Intact Anterior: Intact Achilles: Mild tendinopathy. Plantar Fascia: Intact LIGAMENTS Lateral: Intact Medial: Intact  CARTILAGE Ankle Joint: Mild degenerative changes with small joint effusion. No osteochondral lesion. Subtalar Joints/Sinus Tarsi: The subtalar joints are maintained. No significant degenerative changes or erosions. The sinus tarsi appears normal. The cervical and interosseous ligaments are intact. There are no joint effusions involving the second and third MTP joints without obvious erosive changes. Mild degenerative changes and hallux valgus deformity at the first MTP joint. Bones: No stress fracture or AVN. Other: Moderate edema like signal abnormality involving the forefoot musculature suggesting nonspecific myositis. There is also moderate subcutaneous soft tissue swelling/ edema/ fluid mainly along the dorsum of the foot. IMPRESSION: 1. Nonspecific moderate subcutaneous soft tissue swelling/edema/fluid mainly involving the foot dorsally. 2. Moderate changes of myositis involving the forefoot musculature. 3. Moderate-sized joint effusion involving the second and third MTP joints without obvious erosions. 4. No stress fracture or osteochondral lesions. 5. Intact medial and lateral ankle ligaments and tendons. Electronically Signed   By: Marijo Sanes M.D.   On: 08/26/2017 15:33   Dg Foot Complete Left  Result Date: 08/23/2017 CLINICAL DATA:  Patient reports her left foot has been hurting and swelling onset of Monday. Saw her orthopedic yesterday  and started on bactrim and celebrex PO. Patient reports no relief and was encouraged to come to the ER for IV antibiotics. EXAM: LEFT FOOT - COMPLETE 3+ VIEW COMPARISON:  None. FINDINGS: No fracture.  No bone lesion. There are no areas of bone resorption to suggest osteomyelitis. Mild hallux valgus at the first metatarsophalangeal joint. Remaining joints are normally spaced and aligned. Mild forefoot soft tissue edema.  No soft tissue air. IMPRESSION: 1. No evidence of osteomyelitis.  No soft tissue air. 2. No fracture. Electronically Signed   By: Lajean Manes M.D.    On: 08/23/2017 15:27      TODAY-DAY OF DISCHARGE:  Subjective:   [name] today has no headache,no chest abdominal pain,no new weakness tingling or numbness, feels much better wants to go home today.   Objective:   Blood pressure 136/73, pulse 86, temperature 97.6 F (36.4 C), temperature source Oral, resp. rate 15, height 5' (1.524 m), weight 71.4 kg (157 lb 6.5 oz), SpO2 96 %. No intake or output data in the 24 hours ending 08/30/17 1423 Filed Weights   08/23/17 1213 08/23/17 2030 08/25/17 0500  Weight: 69.9 kg (154 lb) 69.7 kg (153 lb 10.6 oz) 71.4 kg (157 lb 6.5 oz)    Exam: Awake Alert, Oriented *3, No new F.N deficits, Normal affect [place].AT,PERRAL Supple Neck,No JVD, No cervical lymphadenopathy appriciated.  Symmetrical Chest wall movement, Good air movement bilaterally, CTAB RRR,No Gallops,Rubs or new Murmurs, No Parasternal Heave +ve B.Sounds, Abd Soft, Non tender, No organomegaly appriciated, No rebound -guarding or rigidity. No Cyanosis, Clubbing or edema, No new Rash or bruise   PERTINENT RADIOLOGIC STUDIES: Mr Foot Left Wo Contrast  Result Date: 08/26/2017 CLINICAL DATA:  Diffuse left ankle and foot pain. EXAM: MRI OF THE LEFT FOOT WITHOUT CONTRAST TECHNIQUE: Multiplanar, multisequence MR imaging of the ankle was performed. No intravenous contrast was administered. COMPARISON:  Radiographs 08/23/2017 FINDINGS: TENDONS Peroneal: Intact Posteromedial: Intact Anterior: Intact Achilles: Mild tendinopathy. Plantar Fascia: Intact LIGAMENTS Lateral: Intact Medial: Intact CARTILAGE Ankle Joint: Mild degenerative changes with small joint effusion. No osteochondral lesion. Subtalar Joints/Sinus Tarsi: The subtalar joints are maintained. No significant degenerative changes or erosions. The sinus tarsi appears normal. The cervical and interosseous ligaments are intact. There are no joint effusions involving the second and third MTP joints without obvious erosive changes. Mild  degenerative changes and hallux valgus deformity at the first MTP joint. Bones: No stress fracture or AVN. Other: Moderate edema like signal abnormality involving the forefoot musculature suggesting nonspecific myositis. There is also moderate subcutaneous soft tissue swelling/ edema/ fluid mainly along the dorsum of the foot. IMPRESSION: 1. Nonspecific moderate subcutaneous soft tissue swelling/edema/fluid mainly involving the foot dorsally. 2. Moderate changes of myositis involving the forefoot musculature. 3. Moderate-sized joint effusion involving the second and third MTP joints without obvious erosions. 4. No stress fracture or osteochondral lesions. 5. Intact medial and lateral ankle ligaments and tendons. Electronically Signed   By: Marijo Sanes M.D.   On: 08/26/2017 15:33   Dg Foot Complete Left  Result Date: 08/23/2017 CLINICAL DATA:  Patient reports her left foot has been hurting and swelling onset of Monday. Saw her orthopedic yesterday and started on bactrim and celebrex PO. Patient reports no relief and was encouraged to come to the ER for IV antibiotics. EXAM: LEFT FOOT - COMPLETE 3+ VIEW COMPARISON:  None. FINDINGS: No fracture.  No bone lesion. There are no areas of bone resorption to suggest osteomyelitis. Mild hallux valgus at the first  metatarsophalangeal joint. Remaining joints are normally spaced and aligned. Mild forefoot soft tissue edema.  No soft tissue air. IMPRESSION: 1. No evidence of osteomyelitis.  No soft tissue air. 2. No fracture. Electronically Signed   By: Lajean Manes M.D.   On: 08/23/2017 15:27     PERTINENT LAB RESULTS: CBC: No results for input(s): WBC, HGB, HCT, PLT in the last 72 hours. CMET CMP     Component Value Date/Time   NA 140 08/28/2017 0555   K 4.1 08/28/2017 0555   CL 104 08/28/2017 0555   CO2 27 08/28/2017 0555   GLUCOSE 113 (H) 08/28/2017 0555   BUN 8 08/28/2017 0555   CREATININE 0.81 08/28/2017 0555   CALCIUM 9.1 08/28/2017 0555   PROT  7.3 07/16/2017 1140   ALBUMIN 4.0 07/16/2017 1140   AST 54 (H) 07/16/2017 1140   ALT 32 07/16/2017 1140   ALKPHOS 64 07/16/2017 1140   BILITOT 0.6 07/16/2017 1140   GFRNONAA >60 08/28/2017 0555   GFRAA >60 08/28/2017 0555    GFR Estimated Creatinine Clearance: 59.5 mL/min (by C-G formula based on SCr of 0.81 mg/dL). No results for input(s): LIPASE, AMYLASE in the last 72 hours. No results for input(s): CKTOTAL, CKMB, CKMBINDEX, TROPONINI in the last 72 hours. Invalid input(s): POCBNP No results for input(s): DDIMER in the last 72 hours. No results for input(s): HGBA1C in the last 72 hours. No results for input(s): CHOL, HDL, LDLCALC, TRIG, CHOLHDL, LDLDIRECT in the last 72 hours. No results for input(s): TSH, T4TOTAL, T3FREE, THYROIDAB in the last 72 hours.  Invalid input(s): FREET3 No results for input(s): VITAMINB12, FOLATE, FERRITIN, TIBC, IRON, RETICCTPCT in the last 72 hours. Coags: No results for input(s): INR in the last 72 hours.  Invalid input(s): PT Microbiology: Recent Results (from the past 240 hour(s))  Blood culture (routine x 2)     Status: None   Collection Time: 08/23/17  4:10 PM  Result Value Ref Range Status   Specimen Description BLOOD RIGHT WRIST  Final   Special Requests   Final    BOTTLES DRAWN AEROBIC AND ANAEROBIC Blood Culture adequate volume   Culture   Final    NO GROWTH 5 DAYS Performed at [facility], [address], [place], [place] [address]    Report Status 08/28/2017 FINAL  Final  Blood culture (routine x 2)     Status: None   Collection Time: 08/23/17  4:20 PM  Result Value Ref Range Status   Specimen Description BLOOD LEFT ANTECUBITAL  Final   Special Requests   Final    BOTTLES DRAWN AEROBIC AND ANAEROBIC Blood Culture adequate volume   Culture   Final    NO GROWTH 5 DAYS Performed at [facility], [place] [address], [place], [place] [address]    Report Status 08/28/2017 FINAL  Final  Anaerobic culture     Status: None  (Preliminary result)   Collection Time: 08/27/17  2:20 PM  Result Value Ref Range Status   Specimen Description FOOT LEFT  Final   Special Requests Normal  Final   Gram Stain   Final    ABUNDANT WBC PRESENT, PREDOMINANTLY PMN NO ORGANISMS SEEN Performed at [facility], [place] [address] [place], [place] [address]    Culture   Final    NO ANAEROBES ISOLATED; CULTURE IN PROGRESS FOR 5 DAYS   Report Status PENDING  Incomplete    FURTHER DISCHARGE INSTRUCTIONS:  Get Medicines reviewed and adjusted: Please  take all your medications with you for your next visit with your Primary MD  Laboratory/radiological data: Please request your Primary MD to go over all hospital tests and procedure/radiological results at the follow up, please ask your Primary MD to get all Hospital records sent to his/her office.  In some cases, they will be blood work, cultures and biopsy results pending at the time of your discharge. Please request that your primary care M.D. goes through all the records of your hospital data and follows up on these results.  Also Note the following: If you experience worsening of your admission symptoms, develop shortness of breath, life threatening emergency, suicidal or homicidal thoughts you must seek medical attention immediately by calling 911 or calling your MD immediately  if symptoms less severe.  You must read complete instructions/literature along with all the possible adverse reactions/side effects for all the Medicines you take and that have been prescribed to you. Take any new Medicines after you have completely understood and accpet all the possible adverse reactions/side effects.   Do not drive when taking Pain medications or sleeping medications (Benzodaizepines)  Do not take more than prescribed Pain, Sleep and Anxiety Medications. It is not advisable to combine anxiety,sleep and pain medications without talking with your primary care practitioner  Special  Instructions: If you have smoked or chewed Tobacco  in the last 2 yrs please stop smoking, stop any regular Alcohol  and or any Recreational drug use.  Wear Seat belts while driving.  Please note: You were cared for by a hospitalist during your hospital stay. Once you are discharged, your primary care physician will handle any further medical issues. Please note that NO REFILLS for any discharge medications will be authorized once you are discharged, as it is imperative that you return to your primary care physician (or establish a relationship with a primary care physician if you do not have one) for your post hospital discharge needs so that they can reassess your need for medications and monitor your lab values.  Total Time spent coordinating discharge including counseling, education and face to face time equals  45 minutes.  SignedOren Binet 08/30/2017 2:23 PM

## 2017-09-01 LAB — ANAEROBIC CULTURE: SPECIAL REQUESTS: NORMAL

## 2017-09-04 DIAGNOSIS — I10 Essential (primary) hypertension: Secondary | ICD-10-CM | POA: Diagnosis not present

## 2017-09-04 DIAGNOSIS — L03116 Cellulitis of left lower limb: Secondary | ICD-10-CM | POA: Diagnosis not present

## 2017-09-30 DIAGNOSIS — M79672 Pain in left foot: Secondary | ICD-10-CM | POA: Diagnosis not present

## 2017-09-30 DIAGNOSIS — I10 Essential (primary) hypertension: Secondary | ICD-10-CM | POA: Diagnosis not present

## 2017-09-30 DIAGNOSIS — L03116 Cellulitis of left lower limb: Secondary | ICD-10-CM | POA: Diagnosis not present

## 2017-10-25 DIAGNOSIS — R945 Abnormal results of liver function studies: Secondary | ICD-10-CM | POA: Diagnosis not present

## 2017-10-25 DIAGNOSIS — R7309 Other abnormal glucose: Secondary | ICD-10-CM | POA: Diagnosis not present

## 2017-10-25 DIAGNOSIS — E559 Vitamin D deficiency, unspecified: Secondary | ICD-10-CM | POA: Diagnosis not present

## 2017-10-25 DIAGNOSIS — Z79899 Other long term (current) drug therapy: Secondary | ICD-10-CM | POA: Diagnosis not present

## 2017-11-06 DIAGNOSIS — R945 Abnormal results of liver function studies: Secondary | ICD-10-CM | POA: Diagnosis not present

## 2017-11-06 DIAGNOSIS — I10 Essential (primary) hypertension: Secondary | ICD-10-CM | POA: Diagnosis not present

## 2017-11-06 DIAGNOSIS — Z630 Problems in relationship with spouse or partner: Secondary | ICD-10-CM | POA: Diagnosis not present

## 2017-11-13 DIAGNOSIS — M9903 Segmental and somatic dysfunction of lumbar region: Secondary | ICD-10-CM | POA: Diagnosis not present

## 2017-11-13 DIAGNOSIS — M5136 Other intervertebral disc degeneration, lumbar region: Secondary | ICD-10-CM | POA: Diagnosis not present

## 2017-11-13 DIAGNOSIS — M9905 Segmental and somatic dysfunction of pelvic region: Secondary | ICD-10-CM | POA: Diagnosis not present

## 2017-11-13 DIAGNOSIS — M5417 Radiculopathy, lumbosacral region: Secondary | ICD-10-CM | POA: Diagnosis not present

## 2017-11-13 DIAGNOSIS — M5414 Radiculopathy, thoracic region: Secondary | ICD-10-CM | POA: Diagnosis not present

## 2017-11-13 DIAGNOSIS — M9902 Segmental and somatic dysfunction of thoracic region: Secondary | ICD-10-CM | POA: Diagnosis not present

## 2017-11-15 DIAGNOSIS — M5414 Radiculopathy, thoracic region: Secondary | ICD-10-CM | POA: Diagnosis not present

## 2017-11-15 DIAGNOSIS — M9902 Segmental and somatic dysfunction of thoracic region: Secondary | ICD-10-CM | POA: Diagnosis not present

## 2017-11-15 DIAGNOSIS — M9903 Segmental and somatic dysfunction of lumbar region: Secondary | ICD-10-CM | POA: Diagnosis not present

## 2017-11-15 DIAGNOSIS — M9905 Segmental and somatic dysfunction of pelvic region: Secondary | ICD-10-CM | POA: Diagnosis not present

## 2017-11-15 DIAGNOSIS — M5136 Other intervertebral disc degeneration, lumbar region: Secondary | ICD-10-CM | POA: Diagnosis not present

## 2017-11-15 DIAGNOSIS — M5417 Radiculopathy, lumbosacral region: Secondary | ICD-10-CM | POA: Diagnosis not present

## 2017-11-19 DIAGNOSIS — M5417 Radiculopathy, lumbosacral region: Secondary | ICD-10-CM | POA: Diagnosis not present

## 2017-11-19 DIAGNOSIS — M5414 Radiculopathy, thoracic region: Secondary | ICD-10-CM | POA: Diagnosis not present

## 2017-11-19 DIAGNOSIS — M5136 Other intervertebral disc degeneration, lumbar region: Secondary | ICD-10-CM | POA: Diagnosis not present

## 2017-11-19 DIAGNOSIS — M9902 Segmental and somatic dysfunction of thoracic region: Secondary | ICD-10-CM | POA: Diagnosis not present

## 2017-11-19 DIAGNOSIS — M9905 Segmental and somatic dysfunction of pelvic region: Secondary | ICD-10-CM | POA: Diagnosis not present

## 2017-11-19 DIAGNOSIS — M9903 Segmental and somatic dysfunction of lumbar region: Secondary | ICD-10-CM | POA: Diagnosis not present

## 2017-11-25 ENCOUNTER — Ambulatory Visit: Payer: Medicare Other | Admitting: Adult Health

## 2017-11-26 ENCOUNTER — Encounter: Payer: Self-pay | Admitting: Adult Health

## 2017-11-26 DIAGNOSIS — R945 Abnormal results of liver function studies: Secondary | ICD-10-CM | POA: Diagnosis not present

## 2017-11-28 ENCOUNTER — Ambulatory Visit: Payer: Medicare Other | Admitting: Adult Health

## 2017-12-03 ENCOUNTER — Other Ambulatory Visit: Payer: Self-pay | Admitting: Internal Medicine

## 2017-12-03 DIAGNOSIS — Z139 Encounter for screening, unspecified: Secondary | ICD-10-CM

## 2017-12-05 ENCOUNTER — Encounter: Payer: Self-pay | Admitting: Adult Health

## 2017-12-05 ENCOUNTER — Ambulatory Visit (INDEPENDENT_AMBULATORY_CARE_PROVIDER_SITE_OTHER): Payer: Medicare Other | Admitting: Adult Health

## 2017-12-05 ENCOUNTER — Encounter (INDEPENDENT_AMBULATORY_CARE_PROVIDER_SITE_OTHER): Payer: Self-pay

## 2017-12-05 VITALS — BP 109/72 | HR 90 | Ht 60.0 in | Wt 156.0 lb

## 2017-12-05 DIAGNOSIS — G500 Trigeminal neuralgia: Secondary | ICD-10-CM

## 2017-12-05 DIAGNOSIS — M5442 Lumbago with sciatica, left side: Secondary | ICD-10-CM | POA: Diagnosis not present

## 2017-12-05 MED ORDER — GABAPENTIN 100 MG PO CAPS: 100.0000 mg | ORAL_CAPSULE | Freq: Two times a day (BID) | ORAL | 11 refills | Status: DC

## 2017-12-05 NOTE — Progress Notes (Signed)
PATIENT: [name] DOB: 12-Oct-1951  REASON FOR VISIT: follow up HISTORY FROM: patient  HISTORY OF PRESENT ILLNESS:  Today 12/05/17:  [name] is a 67 year old female with a history of trigeminal neuralgia affecting the right V2 distribution.  She returns today for evaluation.  She reports that gabapentin has been controlling her discomfort.  She is currently taking gabapentin 100 mg twice a day.  He denies any breakthrough pain.  She does state that she is been having ongoing back pain.  She reports it first started on the right side of the back and now has moved to the left side and radiates down the left leg.  She reports that she has been seeing a chiropractor for the pain but has not been recently.  She states because of the pain sometimes her gait is affected.  She reports that sitting certain ways also exacerbates the pain.  She returns today for an evaluation. HISTORY [name] is a 67 year old right-handed black female with a history of trigeminal neuralgia affecting the right V2 distribution. The patient is on gabapentin in very low dose taking 100 mg 3 times daily, she has done well with this dose. She is tolerating the medication well. She does have twinges of pain that may occur and are usually associated with excessive chewing or brushing her teeth. She does not seem to have any activation of pain with drinking hot or cold liquids. Talking does not initiate pain. The patient denies any other significant medical issues that have come up since last seen. She is not having any side effects on the current dose of medication.  REVIEW OF SYSTEMS: Out of a complete 14 system review of symptoms, the patient complains only of the following symptoms, and all other reviewed systems are negative.  See HPI  ALLERGIES: Allergies  Allergen Reactions  . Ibuprofen Hives and Swelling  . Penicillins Hives and Swelling  . Latex Itching    HOME MEDICATIONS: Outpatient Medications Prior  to Visit  Medication Sig Dispense Refill  . Bepotastine Besilate (BEPREVE) 1.5 % SOLN Place 1 drop into both eyes 2 (two) times daily as needed (allergies).     . cholecalciferol (VITAMIN D) 1000 UNITS tablet Take 5,000 Units by mouth daily.     . colchicine 0.6 MG tablet Take 1 tablet (0.6 mg total) daily by mouth. 30 tablet 0  . DEXILANT 60 MG capsule Take 1 capsule by mouth daily.  0  . nebivolol (BYSTOLIC) 5 MG tablet Take 5 mg by mouth every evening. For HTN    . ondansetron (ZOFRAN ODT) 4 MG disintegrating tablet Take 1 tablet (4 mg total) by mouth every 8 (eight) hours as needed for nausea or vomiting. 20 tablet 0  . rosuvastatin (CRESTOR) 10 MG tablet Take 10 mg by mouth every evening. For hyperlipidemia    . traMADol (ULTRAM) 50 MG tablet Take 1 tablet (50 mg total) every 6 (six) hours as needed by mouth for moderate pain. 15 tablet 0  . zolpidem (AMBIEN) 5 MG tablet Take 5 mg by mouth at bedtime as needed for sleep.     . cephALEXin (KEFLEX) 500 MG capsule Take 1 capsule (500 mg total) every 8 (eight) hours by mouth. (Patient not taking: Reported on 12/05/2017) 9 capsule 0  . dicyclomine (BENTYL) 20 MG tablet Take 1 tablet (20 mg total) by mouth 2 (two) times daily as needed for spasms (abdominal pain). (Patient not taking: Reported on 08/23/2017) 20 tablet 0  .  Phenylephrine-APAP-Guaifenesin (TYLENOL SINUS SEVERE) 5-325-200 MG TABS Take 2 tablets by mouth every 6 (six) hours as needed (for cold).    . predniSONE (DELTASONE) 10 MG tablet Take 4 tablets (40 mg) daily for 1 day, then, Take 3 tablets (30 mg) daily for 1 days, then, Take 2 tablets (20 mg) daily for 1 days, then stop 9 tablet 0   No facility-administered medications prior to visit.     PAST MEDICAL HISTORY: Past Medical History:  Diagnosis Date  . Anxiety    takes Xanax prn  . Arthritis   . Diverticulosis   . H/O blood transfusion reaction 2010   hives  . H/O migraine    last 15yrs ago  . Headache(784.0)   . Heart  murmur    takes Bystolic nightly  . History of blood clots 33yrs   was on Lovenox injections and Coumadin(was only on that for short period of time)  . History of shingles 3-7yrs ago  . Hx of seasonal allergies    takes Levocetirizine prn  . Hyperlipidemia    takes Crestor nightly  . Hypertension    takes Maxzide daily  . Insomnia    takes Ambien prn  . Joint pain   . Joint swelling   . Trigeminal neuralgia of right side of face 03/26/2016   V2 distribution    PAST SURGICAL HISTORY: Past Surgical History:  Procedure Laterality Date  . ABDOMINAL HYSTERECTOMY    . BREAST BIOPSY  05/03/2006   [place]  . BREAST REDUCTION SURGERY  1987  . CHOLECYSTECTOMY    . COLONOSCOPY    . ESOPHAGOGASTRODUODENOSCOPY    . EYE SURGERY Right    eye lash removed  . JOINT REPLACEMENT     bilateral hip rt in 2007 and lt in 2010  . NM MYOCAR PERF WALL MOTION  11/04/2008   Normal  . REDUCTION MAMMAPLASTY Bilateral 1988  . REDUCTION MAMMAPLASTY    . SHOULDER ARTHROSCOPY WITH DISTAL CLAVICLE RESECTION Right 09/21/2015   Procedure: RIGHT SHOULDER ARTHROSCOPY WITH DISTAL CLAVICLE EXCISION DEBRIDE LABRAL TEAR ACROMIOPLASTY ;  Surgeon: Frederik Pear, MD;  Location: [facility];  Service: Orthopedics;  Laterality: Right;  . TEE WITHOUT CARDIOVERSION N/A 04/12/2015   Procedure: TRANSESOPHAGEAL ECHOCARDIOGRAM (TEE)/BUBBLE STUDY;  Surgeon: Adrian Prows, MD;  Location: [facility];  Service: Cardiovascular;  Laterality: N/A;  . TOTAL HIP REVISION  04/14/2012   Procedure: TOTAL HIP REVISION;  Surgeon: Kerin Salen, MD;  Location: [facility];  Service: Orthopedics;  Laterality: Right;  right total hip revision  . TOTAL HIP REVISION Left 11/30/2013   Procedure: TOTAL HIP REVISION;  Surgeon: Kerin Salen, MD;  Location: [facility];  Service: Orthopedics;  Laterality: Left;    FAMILY HISTORY: Family History  Problem Relation Age of Onset  . Heart attack Mother   . Alzheimer's disease Mother   . Diabetes Sister   .  Hypertension Sister     SOCIAL HISTORY: Social History   Socioeconomic History  . Marital status: Married    Spouse name: Not on file  . Number of children: 2  . Years of education: 26  . Highest education level: Not on file  Social Needs  . Financial resource strain: Not on file  . Food insecurity - worry: Not on file  . Food insecurity - inability: Not on file  . Transportation needs - medical: Not on file  . Transportation needs - non-medical: Not on file  Occupational History  . Occupation: Retired  Tobacco Use  . Smoking status: Passive Smoke Exposure - Never Smoker  . Smokeless tobacco: Never Used  Substance and Sexual Activity  . Alcohol use: Yes    Comment: occasionally  . Drug use: No  . Sexual activity: Yes    Birth control/protection: Surgical  Other Topics Concern  . Not on file  Social History Narrative   Lives at home w/ her husband   Right-handed   About 1 cup of coffee every other day      PHYSICAL EXAM  Vitals:   12/05/17 1316  BP: 109/72  Pulse: 90  Weight: 156 lb (70.8 kg)  Height: 5' (1.524 m)   Body mass index is 30.47 kg/m.  Generalized: Well developed, in no acute distress   Neurological examination  Mentation: Alert oriented to time, place, history taking. Follows all commands speech and language fluent Cranial nerve II-XII: Pupils were equal round reactive to light. Extraocular movements were full, visual field were full on confrontational test. Facial sensation and strength were normal. Uvula tongue midline. Head turning and shoulder shrug  were normal and symmetric. Motor: The motor testing reveals 5 over 5 strength of all 4 extremities. Good symmetric motor tone is noted throughout.  The pain in the middle of the left buttocks with palpation. Sensory: Sensory testing is intact to soft touch on all 4 extremities. No evidence of extinction is noted.  Coordination: Cerebellar testing reveals good finger-nose-finger and heel-to-shin  bilaterally.  Gait and station: Gait is normal. Tandem gait not attempted.  Romberg is negative. No drift is seen.  Reflexes: Deep tendon reflexes are symmetric and normal bilaterally.   DIAGNOSTIC DATA (LABS, IMAGING, TESTING) - I reviewed patient records, labs, notes, testing and imaging myself where available.  Lab Results  Component Value Date   WBC 5.6 08/27/2017   HGB 11.4 (L) 08/27/2017   HCT 34.9 (L) 08/27/2017   MCV 90.4 08/27/2017   PLT 231 08/27/2017      Component Value Date/Time   NA 140 08/28/2017 0555   K 4.1 08/28/2017 0555   CL 104 08/28/2017 0555   CO2 27 08/28/2017 0555   GLUCOSE 113 (H) 08/28/2017 0555   BUN 8 08/28/2017 0555   CREATININE 0.81 08/28/2017 0555   CALCIUM 9.1 08/28/2017 0555   PROT 7.3 07/16/2017 1140   ALBUMIN 4.0 07/16/2017 1140   AST 54 (H) 07/16/2017 1140   ALT 32 07/16/2017 1140   ALKPHOS 64 07/16/2017 1140   BILITOT 0.6 07/16/2017 1140   GFRNONAA >60 08/28/2017 0555   GFRAA >60 08/28/2017 0555       ASSESSMENT AND PLAN 67 y.o. year old female  has a past medical history of Anxiety, Arthritis, Diverticulosis, H/O blood transfusion reaction (2010), H/O migraine, Headache(784.0), Heart murmur, History of blood clots (19yrs), History of shingles (3-4yrs ago), seasonal allergies, Hyperlipidemia, Hypertension, Insomnia, Joint pain, Joint swelling, and Trigeminal neuralgia of right side of face (03/26/2016). here with:  1.  Trigeminal neuralgia of the right side of the face  2.  Low back pain with left-sided sciatica  Overall the patient is doing well.  She will continue on gabapentin 100 mg twice a day.  Advised that it sounds as if she may have low back pain with left sided sciatica.  I advised that back exercises may alleviate her pain also advised that taking gabapentin may also be of benefit.  She can increase gabapentin to 3 times a day if she finds it beneficial for her back pain.  If  her back pain does not resolve she should let her [place]  or make her PCP aware.  She will follow-up in 1 year or sooner if needed.     Ward Givens, MSN, NP-C 12/05/2017, 1:44 PM [facility] [address], [place], [place] [address] [phone]

## 2017-12-05 NOTE — Progress Notes (Signed)
I have read the note, and I agree with the clinical assessment and plan.  [name]   

## 2017-12-05 NOTE — Patient Instructions (Signed)
Your Plan:  Continue gabapentin twice a day If your symptoms worsen or you develop new symptoms please let us know.   Back Exercises If you have pain in your back, do these exercises 2-3 times each day or as told by your doctor. When the pain goes away, do the exercises once each day, but repeat the steps more times for each exercise (do more repetitions). If you do not have pain in your back, do these exercises once each day or as told by your doctor. Exercises Single Knee to Chest  Do these steps 3-5 times in a row for each leg: 1. Lie on your back on a firm bed or the floor with your legs stretched out. 2. Bring one knee to your chest. 3. Hold your knee to your chest by grabbing your knee or thigh. 4. Pull on your knee until you feel a gentle stretch in your lower back. 5. Keep doing the stretch for 10-30 seconds. 6. Slowly let go of your leg and straighten it.  Pelvic Tilt  Do these steps 5-10 times in a row: 1. Lie on your back on a firm bed or the floor with your legs stretched out. 2. Bend your knees so they point up to the ceiling. Your feet should be flat on the floor. 3. Tighten your lower belly (abdomen) muscles to press your lower back against the floor. This will make your tailbone point up to the ceiling instead of pointing down to your feet or the floor. 4. Stay in this position for 5-10 seconds while you gently tighten your muscles and breathe evenly.  Cat-Cow  Do these steps until your lower back bends more easily: 1. Get on your hands and knees on a firm surface. Keep your hands under your shoulders, and keep your knees under your hips. You may put padding under your knees. 2. Let your head hang down, and make your tailbone point down to the floor so your lower back is round like the back of a cat. 3. Stay in this position for 5 seconds. 4. Slowly lift your head and make your tailbone point up to the ceiling so your back hangs low (sags) like the back of a  cow. 5. Stay in this position for 5 seconds.  Press-Ups  Do these steps 5-10 times in a row: 1. Lie on your belly (face-down) on the floor. 2. Place your hands near your head, about shoulder-width apart. 3. While you keep your back relaxed and keep your hips on the floor, slowly straighten your arms to raise the top half of your body and lift your shoulders. Do not use your back muscles. To make yourself more comfortable, you may change where you place your hands. 4. Stay in this position for 5 seconds. 5. Slowly return to lying flat on the floor.  Bridges  Do these steps 10 times in a row: 1. Lie on your back on a firm surface. 2. Bend your knees so they point up to the ceiling. Your feet should be flat on the floor. 3. Tighten your butt muscles and lift your butt off of the floor until your waist is almost as high as your knees. If you do not feel the muscles working in your butt and the back of your thighs, slide your feet 1-2 inches farther away from your butt. 4. Stay in this position for 3-5 seconds. 5. Slowly lower your butt to the floor, and let your butt muscles relax.  If this  exercise is too easy, try doing it with your arms crossed over your chest. Belly Crunches  Do these steps 5-10 times in a row: 1. Lie on your back on a firm bed or the floor with your legs stretched out. 2. Bend your knees so they point up to the ceiling. Your feet should be flat on the floor. 3. Cross your arms over your chest. 4. Tip your chin a little bit toward your chest but do not bend your neck. 5. Tighten your belly muscles and slowly raise your chest just enough to lift your shoulder blades a tiny bit off of the floor. 6. Slowly lower your chest and your head to the floor.  Back Lifts Do these steps 5-10 times in a row: 1. Lie on your belly (face-down) with your arms at your sides, and rest your forehead on the floor. 2. Tighten the muscles in your legs and your butt. 3. Slowly lift your  chest off of the floor while you keep your hips on the floor. Keep the back of your head in line with the curve in your back. Look at the floor while you do this. 4. Stay in this position for 3-5 seconds. 5. Slowly lower your chest and your face to the floor.  Contact a doctor if:  Your back pain gets a lot worse when you do an exercise.  Your back pain does not lessen 2 hours after you exercise. If you have any of these problems, stop doing the exercises. Do not do them again unless your doctor says it is okay. Get help right away if:  You have sudden, very bad back pain. If this happens, stop doing the exercises. Do not do them again unless your doctor says it is okay. This information is not intended to replace advice given to you by your health care provider. Make sure you discuss any questions you have with your health care provider. Document Released: 11/10/2010 Document Revised: 03/15/2016 Document Reviewed: 12/02/2014 Elsevier Interactive Patient Education  [employer].

## 2017-12-26 ENCOUNTER — Ambulatory Visit
Admission: RE | Admit: 2017-12-26 | Discharge: 2017-12-26 | Disposition: A | Discharge status: Home / Self Care | Payer: Medicare Other | Source: Ambulatory Visit | Attending: Internal Medicine | Admitting: Internal Medicine

## 2017-12-26 DIAGNOSIS — Z1231 Encounter for screening mammogram for malignant neoplasm of breast: Secondary | ICD-10-CM | POA: Diagnosis not present

## 2017-12-26 DIAGNOSIS — Z139 Encounter for screening, unspecified: Secondary | ICD-10-CM

## 2018-01-29 DIAGNOSIS — I700 Atherosclerosis of aorta: Secondary | ICD-10-CM | POA: Diagnosis not present

## 2018-01-29 DIAGNOSIS — R7309 Other abnormal glucose: Secondary | ICD-10-CM | POA: Diagnosis not present

## 2018-01-29 DIAGNOSIS — Z79899 Other long term (current) drug therapy: Secondary | ICD-10-CM | POA: Diagnosis not present

## 2018-01-29 DIAGNOSIS — I10 Essential (primary) hypertension: Secondary | ICD-10-CM | POA: Diagnosis not present

## 2018-02-17 DIAGNOSIS — H25813 Combined forms of age-related cataract, bilateral: Secondary | ICD-10-CM | POA: Diagnosis not present

## 2018-02-17 DIAGNOSIS — H43812 Vitreous degeneration, left eye: Secondary | ICD-10-CM | POA: Diagnosis not present

## 2018-02-17 DIAGNOSIS — H40013 Open angle with borderline findings, low risk, bilateral: Secondary | ICD-10-CM | POA: Diagnosis not present

## 2018-03-12 DIAGNOSIS — H43812 Vitreous degeneration, left eye: Secondary | ICD-10-CM | POA: Diagnosis not present

## 2018-03-12 DIAGNOSIS — H25813 Combined forms of age-related cataract, bilateral: Secondary | ICD-10-CM | POA: Diagnosis not present

## 2018-03-12 DIAGNOSIS — H40013 Open angle with borderline findings, low risk, bilateral: Secondary | ICD-10-CM | POA: Diagnosis not present

## 2018-03-31 ENCOUNTER — Encounter: Payer: Self-pay | Admitting: Adult Health

## 2018-03-31 ENCOUNTER — Ambulatory Visit (INDEPENDENT_AMBULATORY_CARE_PROVIDER_SITE_OTHER): Payer: Medicare Other | Admitting: Adult Health

## 2018-03-31 VITALS — BP 103/68 | HR 72 | Ht 60.0 in | Wt 153.0 lb

## 2018-03-31 DIAGNOSIS — G500 Trigeminal neuralgia: Secondary | ICD-10-CM

## 2018-03-31 NOTE — Patient Instructions (Signed)
Your Plan:  Consider Lyrica or Carbamazepine for trigeminal Gamma Knife surgery If your symptoms worsen or you develop new symptoms please let us know.    Thank you for coming to see [place] at [facility]. I hope we have been able to provide you high quality care today.  You may receive a patient satisfaction survey over the next few weeks. We would appreciate your feedback and comments so that we may continue to improve ourselves and the health of our patients.  Carbamazepine tablets What is this medicine? CARBAMAZEPINE (kar ba MAZ e peen) is used to control seizures caused by certain types of epilepsy. This medicine is also used to treat nerve related pain. It is not for common aches and pains. This medicine may be used for other purposes; ask your health care provider or pharmacist if you have questions. COMMON BRAND NAME(S): Epitol, Tegretol What should I tell my health care provider before I take this medicine? They need to know if you have any of these conditions: -Asian ancestry -bone marrow disease -glaucoma -heart disease or irregular heartbeat -kidney disease -liver disease -low blood counts, like low white cell, platelet, or red cell counts -porphyria -psychotic disorders -suicidal thoughts, plans, or attempt; a previous suicide attempt by you or a family member -an unusual or allergic reaction to carbamazepine, tricyclic antidepressants, phenytoin, phenobarbital or other medicines, foods, dyes, or preservatives -pregnant or trying to get pregnant -breast-feeding How should I use this medicine? Take this medicine by mouth with a glass of water. Follow the directions on the prescription label. Take this medicine with food. Take your doses at regular intervals. Do not take your medicine more often than directed. Do not stop taking this medicine except on the advice of your doctor or health care professional. A special MedGuide will be given to you by the  pharmacist with each prescription and refill. Be sure to read this information carefully each time. Talk to your pediatrician regarding the use of this medicine in children. Special care may be needed. Overdosage: If you think you have taken too much of this medicine contact a poison control center or emergency room at once. NOTE: This medicine is only for you. Do not share this medicine with others. What if I miss a dose? If you miss a dose, take it as soon as you can. If it is almost time for your next dose, take only that dose. Do not take double or extra doses. What may interact with this medicine? Do not take this medicine with any of the following medications: -certain medicines used to treat HIV infection or AIDS that are given in combination with cobicistat - delavirdine - MAOIs like Carbex, Eldepryl, Marplan, Nardil, and Parnate - nefazodone - oxcarbazepine This medicine may also interact with the following medications: - acetaminophen - acetazolamide - barbiturate medicines for inducing sleep or treating seizures, like phenobarbital - certain antibiotics like clarithromycin, erythromycin or troleandomycin - cimetidine - cyclosporine - danazol - dicumarol - doxycycline - female hormones, including estrogens and birth control pills - grapefruit juice - isoniazid, INH - levothyroxine and other thyroid hormones - lithium and other medicines to treat mood problems or psychotic disturbances - loratadine - medicines for angina or high blood pressure - medicines for cancer - medicines for depression or anxiety - medicines for sleep - medicines to treat fungal infections, like fluconazole, itraconazole or ketoconazole - medicines used to treat HIV infection or AIDS - methadone - niacinamide - praziquantel - propoxyphene - rifampin  or rifabutin - seizure or epilepsy medicine - steroid medicines such as prednisone or  cortisone - theophylline - tramadol - warfarin This list may not describe all possible interactions. Give your health care provider a list of all the medicines, herbs, non-prescription drugs, or dietary supplements you use. Also tell them if you smoke, drink alcohol, or use illegal drugs. Some items may interact with your medicine. What should I watch for while using this medicine? Visit your doctor or health care professional for a regular check on your progress. Do not change brands or dosage forms of this medicine without discussing the change with your doctor or health care professional. If you are taking this medicine for epilepsy (seizures) do not stop taking it suddenly. This increases the risk of seizures. Wear a Probation officer or necklace. Carry an identification card with information about your condition, medications, and doctor or health care professional. Dennis Bast may get drowsy, dizzy, or have blurred vision. Do not drive, use machinery, or do anything that needs mental alertness until you know how this medicine affects you. To reduce dizzy or fainting spells, do not sit or stand up quickly, especially if you are an older patient. Alcohol can increase drowsiness and dizziness. Avoid alcoholic drinks. Birth control pills may not work properly while you are taking this medicine. Talk to your doctor about using an extra method of birth control. This medicine can make you more sensitive to the sun. Keep out of the sun. If you cannot avoid being in the sun, wear protective clothing and use sunscreen. Do not use sun lamps or tanning beds/booths. The use of this medicine may increase the chance of suicidal thoughts or actions. Pay special attention to how you are responding while on this medicine. Any worsening of mood, or thoughts of suicide or dying should be reported to your health care professional right away. Women who become pregnant while using this medicine may enroll in the [place] Pregnancy Registry by calling [phone]. This registry collects information about the safety of antiepileptic drug use during pregnancy. What side effects may I notice from receiving this medicine? Side effects that you should report to your doctor or health care professional as soon as possible: -allergic reactions like skin rash, itching or hives, swelling of the face, lips, or tongue -breathing problems -changes in vision -confusion -dark urine -fast or irregular heartbeat -fever or chills, sore throat -mouth ulcers -pain or difficulty passing urine -redness, blistering, peeling or loosening of the skin, including inside the mouth -ringing in the ears -seizures -stomach pain -swollen joints or muscle/joint aches and pains -unusual bleeding or bruising -unusually weak or tired -vomiting -worsening of mood, thoughts or actions of suicide or dying -yellowing of the eyes or skin Side effects that usually do not require medical attention (report to your doctor or health care professional if they continue or are bothersome): -clumsiness or unsteadiness -diarrhea or constipation -headache -increased sweating -nausea This list may not describe all possible side effects. Call your doctor for medical advice about side effects. You may report side effects to FDA at 1-800-FDA-1088. Where should I keep my medicine? Keep out of reach of children. Store at room temperature below 30 degrees C (86 degrees F). Keep container tightly closed. Protect from moisture. Throw away any unused medicine after the expiration date. NOTE: This sheet is a summary. It may not cover all possible information. If you have questions about this medicine, talk to your doctor, pharmacist, or health  care provider.  2018 Elsevier/Gold Standard (2014-09-30 15:38:34) Pregabalin capsules What is this medicine? PREGABALIN (pre GAB a lin) is used to treat nerve pain from diabetes, shingles,  spinal cord injury, and fibromyalgia. It is also used to control seizures in epilepsy. This medicine may be used for other purposes; ask your health care provider or pharmacist if you have questions. COMMON BRAND NAME(S): Lyrica What should I tell my health care provider before I take this medicine? They need to know if you have any of these conditions: -bleeding problems -heart disease, including heart failure -history of alcohol or drug abuse -kidney disease -suicidal thoughts, plans, or attempt; a previous suicide attempt by you or a family member -an unusual or allergic reaction to pregabalin, gabapentin, other medicines, foods, dyes, or preservatives -pregnant or trying to get pregnant or trying to conceive with your partner -breast-feeding How should I use this medicine? Take this medicine by mouth with a glass of water. Follow the directions on the prescription label. You can take this medicine with or without food. Take your doses at regular intervals. Do not take your medicine more often than directed. Do not stop taking except on your doctor's advice. A special MedGuide will be given to you by the pharmacist with each prescription and refill. Be sure to read this information carefully each time. Talk to your pediatrician regarding the use of this medicine in children. Special care may be needed. Overdosage: If you think you have taken too much of this medicine contact a poison control center or emergency room at once. NOTE: This medicine is only for you. Do not share this medicine with others. What if I miss a dose? If you miss a dose, take it as soon as you can. If it is almost time for your next dose, take only that dose. Do not take double or extra doses. What may interact with this medicine? -alcohol -certain medicines for blood pressure like captopril, enalapril, or lisinopril -certain medicines for diabetes, like pioglitazone or rosiglitazone -certain medicines for anxiety or  sleep -narcotic medicines for pain This list may not describe all possible interactions. Give your health care provider a list of all the medicines, herbs, non-prescription drugs, or dietary supplements you use. Also tell them if you smoke, drink alcohol, or use illegal drugs. Some items may interact with your medicine. What should I watch for while using this medicine? Tell your doctor or healthcare professional if your symptoms do not start to get better or if they get worse. Visit your doctor or health care professional for regular checks on your progress. Do not stop taking except on your doctor's advice. You may develop a severe reaction. Your doctor will tell you how much medicine to take. Wear a medical identification bracelet or chain if you are taking this medicine for seizures, and carry a card that describes your disease and details of your medicine and dosage times. You may get drowsy or dizzy. Do not drive, use machinery, or do anything that needs mental alertness until you know how this medicine affects you. Do not stand or sit up quickly, especially if you are an older patient. This reduces the risk of dizzy or fainting spells. Alcohol may interfere with the effect of this medicine. Avoid alcoholic drinks. If you have a heart condition, like congestive heart failure, and notice that you are retaining water and have swelling in your hands or feet, contact your health care provider immediately. The use of this medicine may increase the  chance of suicidal thoughts or actions. Pay special attention to how you are responding while on this medicine. Any worsening of mood, or thoughts of suicide or dying should be reported to your health care professional right away. This medicine has caused reduced sperm counts in some men. This may interfere with the ability to father a child. You should talk to your doctor or health care professional if you are concerned about your fertility. Women who become  pregnant while using this medicine for seizures may enroll in the [place] Pregnancy Registry by calling [phone]. This registry collects information about the safety of antiepileptic drug use during pregnancy. What side effects may I notice from receiving this medicine? Side effects that you should report to your doctor or health care professional as soon as possible: -allergic reactions like skin rash, itching or hives, swelling of the face, lips, or tongue -breathing problems -changes in vision -chest pain -confusion -jerking or unusual movements of any part of your body -loss of memory -muscle pain, tenderness, or weakness -suicidal thoughts or other mood changes -swelling of the ankles, feet, hands -unusual bruising or bleeding Side effects that usually do not require medical attention (report to your doctor or health care professional if they continue or are bothersome): -dizziness -drowsiness -dry mouth -headache -nausea -tremors -trouble sleeping -weight gain This list may not describe all possible side effects. Call your doctor for medical advice about side effects. You may report side effects to FDA at 1-800-FDA-1088. Where should I keep my medicine? Keep out of the reach of children. This medicine can be abused. Keep your medicine in a safe place to protect it from theft. Do not share this medicine with anyone. Selling or giving away this medicine is dangerous and against the law. This medicine may cause accidental overdose and death if it taken by other adults, children, or pets. Mix any unused medicine with a substance like cat litter or coffee grounds. Then throw the medicine away in a sealed container like a sealed bag or a coffee can with a lid. Do not use the medicine after the expiration date. Store at room temperature between 15 and 30 degrees C (59 and 86 degrees F). NOTE: This sheet is a summary. It may not cover all possible  information. If you have questions about this medicine, talk to your doctor, pharmacist, or health care provider.  2018 Elsevier/Gold Standard (2015-11-10 10:26:12)

## 2018-03-31 NOTE — Progress Notes (Signed)
PATIENT: [name] DOB: [birth year]  REASON FOR VISIT: follow up HISTORY FROM: patient  HISTORY OF PRESENT ILLNESS: Today 03/31/18 [name] is a 67 year old female with a history of trigeminal neuralgia.  He returns today for follow-up.  She states that she stopped gabapentin as it was causing her to feel "foggy headed."  She states that starting about 2 months ago she began having pain in the right side of the face again.  She states that it usually starts when she goes downstairs in the morning to eat breakfast.  She states that it radiates from the cheek down to the mouth.  It is usually present most of the day.  It does not keep her up at night.  She denies any facial numbness.  Denies any other symptoms.  She would like to consider another medication at this time.  HISTORY 12/05/17:  [name] is a 67 year old female with a history of trigeminal neuralgia affecting the right V2 distribution.  She returns today for evaluation.  She reports that gabapentin has been controlling her discomfort.  She is currently taking gabapentin 100 mg twice a day.  He denies any breakthrough pain.  She does state that she is been having ongoing back pain.  She reports it first started on the right side of the back and now has moved to the left side and radiates down the left leg.  She reports that she has been seeing a chiropractor for the pain but has not been recently.  She states because of the pain sometimes her gait is affected.  She reports that sitting certain ways also exacerbates the pain.  She returns today for an evaluation.  HISTORY VeronicaSuttleis a 67 year old right-handed black female with a history of trigeminal neuralgia affecting the right V2distribution. The patient is on gabapentin in very low dose taking 100 mg 3 times daily, she has done well with this dose. She is tolerating the medication well. She does have twinges of pain that may occur and are usually associated with excessive  chewing or brushing her teeth. She does not seem to have any activation of pain with drinking hot or cold liquids. Talking does not initiate pain. The patient denies any other significant medical issues that have come up since last seen. She is not having any side effects on the current dose of medication.    REVIEW OF SYSTEMS: Out of a complete 14 system review of symptoms, the patient complains only of the following symptoms, and all other reviewed systems are negative.  See HPI  ALLERGIES: Allergies  Allergen Reactions  . Ibuprofen Hives and Swelling  . Penicillins Hives and Swelling  . Latex Itching    HOME MEDICATIONS: Outpatient Medications Prior to Visit  Medication Sig Dispense Refill  . Bepotastine Besilate (BEPREVE) 1.5 % SOLN Place 1 drop into both eyes 2 (two) times daily as needed (allergies).     . cholecalciferol (VITAMIN D) 1000 UNITS tablet Take 5,000 Units by mouth daily.     . colchicine 0.6 MG tablet Take 1 tablet (0.6 mg total) daily by mouth. 30 tablet 0  . DEXILANT 60 MG capsule Take 1 capsule by mouth daily.  0  . nebivolol (BYSTOLIC) 5 MG tablet Take 5 mg by mouth every evening. For HTN    . ondansetron (ZOFRAN ODT) 4 MG disintegrating tablet Take 1 tablet (4 mg total) by mouth every 8 (eight) hours as needed for nausea or vomiting. 20 tablet 0  . rosuvastatin (  CRESTOR) 10 MG tablet Take 10 mg by mouth every evening. For hyperlipidemia    . traMADol (ULTRAM) 50 MG tablet Take 1 tablet (50 mg total) every 6 (six) hours as needed by mouth for moderate pain. 15 tablet 0  . zolpidem (AMBIEN) 5 MG tablet Take 5 mg by mouth at bedtime as needed for sleep.     Marland Kitchen gabapentin (NEURONTIN) 100 MG capsule Take 1 capsule (100 mg total) by mouth 2 (two) times daily. (Patient not taking: Reported on 03/31/2018) 60 capsule 11   No facility-administered medications prior to visit.     PAST MEDICAL HISTORY: Past Medical History:  Diagnosis Date  . Anxiety    takes Xanax prn   . Arthritis   . Diverticulosis   . H/O blood transfusion reaction 2010   hives  . H/O migraine    last 7yrs ago  . Headache(784.0)   . Heart murmur    takes Bystolic nightly  . History of blood clots 30yrs   was on Lovenox injections and Coumadin(was only on that for short period of time)  . History of shingles 3-26yrs ago  . Hx of seasonal allergies    takes Levocetirizine prn  . Hyperlipidemia    takes Crestor nightly  . Hypertension    takes Maxzide daily  . Insomnia    takes Ambien prn  . Joint pain   . Joint swelling   . Trigeminal neuralgia of right side of face 03/26/2016   V2 distribution    PAST SURGICAL HISTORY: Past Surgical History:  Procedure Laterality Date  . ABDOMINAL HYSTERECTOMY    . BREAST BIOPSY  05/03/2006   [place]  . BREAST REDUCTION SURGERY  1987  . CHOLECYSTECTOMY    . COLONOSCOPY    . ESOPHAGOGASTRODUODENOSCOPY    . EYE SURGERY Right    eye lash removed  . JOINT REPLACEMENT     bilateral hip rt in 2007 and lt in 2010  . NM MYOCAR PERF WALL MOTION  11/04/2008   Normal  . REDUCTION MAMMAPLASTY Bilateral 1988  . REDUCTION MAMMAPLASTY    . SHOULDER ARTHROSCOPY WITH DISTAL CLAVICLE RESECTION Right 09/21/2015   Procedure: RIGHT SHOULDER ARTHROSCOPY WITH DISTAL CLAVICLE EXCISION DEBRIDE LABRAL TEAR ACROMIOPLASTY ;  Surgeon: Frederik Pear, MD;  Location: [place];  Service: Orthopedics;  Laterality: Right;  . TEE WITHOUT CARDIOVERSION N/A 04/12/2015   Procedure: TRANSESOPHAGEAL ECHOCARDIOGRAM (TEE)/BUBBLE STUDY;  Surgeon: Adrian Prows, MD;  Location: [place];  Service: Cardiovascular;  Laterality: N/A;  . TOTAL HIP REVISION  04/14/2012   Procedure: TOTAL HIP REVISION;  Surgeon: Kerin Salen, MD;  Location: [place];  Service: Orthopedics;  Laterality: Right;  right total hip revision  . TOTAL HIP REVISION Left 11/30/2013   Procedure: TOTAL HIP REVISION;  Surgeon: Kerin Salen, MD;  Location: [place];  Service: Orthopedics;  Laterality: Left;     FAMILY HISTORY: Family History  Problem Relation Age of Onset  . Heart attack Mother   . Alzheimer's disease Mother   . Diabetes Sister   . Hypertension Sister     SOCIAL HISTORY: Social History   Socioeconomic History  . Marital status: Married    Spouse name: Not on file  . Number of children: 2  . Years of education: 38  . Highest education level: Not on file  Occupational History  . Occupation: Retired  Scientific laboratory technician  . Financial resource strain: Not on file  . Food insecurity:    Worry: Not  on file    Inability: Not on file  . Transportation needs:    Medical: Not on file    Non-medical: Not on file  Tobacco Use  . Smoking status: Passive Smoke Exposure - Never Smoker  . Smokeless tobacco: Never Used  Substance and Sexual Activity  . Alcohol use: Yes    Comment: occasionally  . Drug use: No  . Sexual activity: Yes    Birth control/protection: Surgical  Lifestyle  . Physical activity:    Days per week: Not on file    Minutes per session: Not on file  . Stress: Not on file  Relationships  . Social connections:    Talks on phone: Not on file    Gets together: Not on file    Attends religious service: Not on file    Active member of club or organization: Not on file    Attends meetings of clubs or organizations: Not on file    Relationship status: Not on file  . Intimate partner violence:    Fear of current or ex partner: Not on file    Emotionally abused: Not on file    Physically abused: Not on file    Forced sexual activity: Not on file  Other Topics Concern  . Not on file  Social History Narrative   Lives at home w/ her husband   Right-handed   About 1 cup of coffee every other day      PHYSICAL EXAM  Vitals:   03/31/18 1348  BP: 103/68  Pulse: 72  Weight: 153 lb (69.4 kg)  Height: 5' (1.524 m)   Body mass index is 29.88 kg/m.  Generalized: Well developed, in no acute distress   Neurological examination  Mentation: Alert  oriented to time, place, history taking. Follows all commands speech and language fluent Cranial nerve II-XII: Pupils were equal round reactive to light. Extraocular movements were full, visual field were full on confrontational test. Facial sensation and strength were normal. Uvula tongue midline. Head turning and shoulder shrug  were normal and symmetric. Motor: The motor testing reveals 5 over 5 strength of all 4 extremities. Good symmetric motor tone is noted throughout.  Sensory: Sensory testing is intact to soft touch on all 4 extremities. No evidence of extinction is noted.  Coordination: Cerebellar testing reveals good finger-nose-finger and heel-to-shin bilaterally.  Gait and station: Gait is normal.  Reflexes: Deep tendon reflexes are symmetric and normal bilaterally.   DIAGNOSTIC DATA (LABS, IMAGING, TESTING) - I reviewed patient records, labs, notes, testing and imaging myself where available.  Lab Results  Component Value Date   WBC 5.6 08/27/2017   HGB 11.4 (L) 08/27/2017   HCT 34.9 (L) 08/27/2017   MCV 90.4 08/27/2017   PLT 231 08/27/2017      Component Value Date/Time   NA 140 08/28/2017 0555   K 4.1 08/28/2017 0555   CL 104 08/28/2017 0555   CO2 27 08/28/2017 0555   GLUCOSE 113 (H) 08/28/2017 0555   BUN 8 08/28/2017 0555   CREATININE 0.81 08/28/2017 0555   CALCIUM 9.1 08/28/2017 0555   PROT 7.3 07/16/2017 1140   ALBUMIN 4.0 07/16/2017 1140   AST 54 (H) 07/16/2017 1140   ALT 32 07/16/2017 1140   ALKPHOS 64 07/16/2017 1140   BILITOT 0.6 07/16/2017 1140   GFRNONAA >60 08/28/2017 0555   GFRAA >60 08/28/2017 0555         ASSESSMENT AND PLAN 67 y.o. year old female  has a past  medical history of Anxiety, Arthritis, Diverticulosis, H/O blood transfusion reaction (2010), H/O migraine, Headache(784.0), Heart murmur, History of blood clots (65yrs), History of shingles (3-40yrs ago), seasonal allergies, Hyperlipidemia, Hypertension, Insomnia, Joint pain, Joint  swelling, and Trigeminal neuralgia of right side of face (03/26/2016). here with:  1.  Trigeminal neuralgia of right side of the face  I discussed several medications with the patient.  We discussed carbamazepine and Lyrica.  Patient was given handouts regarding both of these medications.  She will read over the event let us know which when she would like to start.  She was also inquiring about gamma knife surgery.  I recommended that we try another oral medication first.  Patient voiced understanding.  She will call back when she has read over the medication.  She will follow-up in 6 months or sooner if needed.  I spent 15 minutes with the patient. 50% of this time was spent discussing medication options.   Ward Givens, MSN, NP-C 03/31/2018, 2:04 PM [facility] [address] [place], [place] [address] [phone]

## 2018-03-31 NOTE — Progress Notes (Signed)
I have read the note, and I agree with the clinical assessment and plan.  Lineth Thielke K Chancie Lampert   

## 2018-04-01 ENCOUNTER — Telehealth: Payer: Self-pay | Admitting: Adult Health

## 2018-04-01 DIAGNOSIS — G500 Trigeminal neuralgia: Secondary | ICD-10-CM

## 2018-04-01 NOTE — Telephone Encounter (Signed)
Patient would like to be referred to a neurosurgeon. Please call and discuss.

## 2018-04-01 NOTE — Telephone Encounter (Signed)
Spoke to pt and she has done her homework relating to what medication (most likely carbamazepine) but first she would like referral to NS to see all her options re: trig neuralgia pain.

## 2018-04-03 NOTE — Addendum Note (Signed)
Addended by: Trudie Buckler on: 04/03/2018 02:42 PM   Modules accepted: Orders

## 2018-04-03 NOTE — Telephone Encounter (Signed)
Noted  

## 2018-04-03 NOTE — Telephone Encounter (Signed)
Order placed for neurosurgery.

## 2018-04-15 DIAGNOSIS — M7062 Trochanteric bursitis, left hip: Secondary | ICD-10-CM | POA: Diagnosis not present

## 2018-04-21 DIAGNOSIS — R21 Rash and other nonspecific skin eruption: Secondary | ICD-10-CM | POA: Diagnosis not present

## 2018-05-05 DIAGNOSIS — G500 Trigeminal neuralgia: Secondary | ICD-10-CM | POA: Diagnosis not present

## 2018-05-10 ENCOUNTER — Emergency Department (HOSPITAL_COMMUNITY)
Admission: EM | Admit: 2018-05-10 | Discharge: 2018-05-11 | Disposition: A | Discharge status: Home / Self Care | Payer: Medicare Other | Attending: Emergency Medicine | Admitting: Emergency Medicine

## 2018-05-10 ENCOUNTER — Encounter (HOSPITAL_COMMUNITY): Payer: Self-pay | Admitting: *Deleted

## 2018-05-10 ENCOUNTER — Other Ambulatory Visit: Payer: Self-pay

## 2018-05-10 DIAGNOSIS — R1011 Right upper quadrant pain: Secondary | ICD-10-CM | POA: Diagnosis not present

## 2018-05-10 DIAGNOSIS — Z79899 Other long term (current) drug therapy: Secondary | ICD-10-CM | POA: Diagnosis not present

## 2018-05-10 DIAGNOSIS — R110 Nausea: Secondary | ICD-10-CM

## 2018-05-10 DIAGNOSIS — M545 Low back pain: Secondary | ICD-10-CM | POA: Insufficient documentation

## 2018-05-10 DIAGNOSIS — K760 Fatty (change of) liver, not elsewhere classified: Secondary | ICD-10-CM | POA: Diagnosis not present

## 2018-05-10 LAB — I-STAT TROPONIN, ED: Troponin i, poc: 0 ng/mL (ref 0.00–0.08)

## 2018-05-10 LAB — I-STAT CHEM 8, ED
BUN: 21 mg/dL (ref 8–23)
CHLORIDE: 101 mmol/L (ref 98–111)
Calcium, Ion: 1.1 mmol/L — ABNORMAL LOW (ref 1.15–1.40)
Creatinine, Ser: 1.3 mg/dL — ABNORMAL HIGH (ref 0.44–1.00)
GLUCOSE: 134 mg/dL — AB (ref 70–99)
HCT: 43 % (ref 36.0–46.0)
Hemoglobin: 14.6 g/dL (ref 12.0–15.0)
POTASSIUM: 4 mmol/L (ref 3.5–5.1)
Sodium: 138 mmol/L (ref 135–145)
TCO2: 31 mmol/L (ref 22–32)

## 2018-05-10 LAB — URINALYSIS, ROUTINE W REFLEX MICROSCOPIC
Bilirubin Urine: NEGATIVE
GLUCOSE, UA: NEGATIVE mg/dL
Hgb urine dipstick: NEGATIVE
KETONES UR: NEGATIVE mg/dL
LEUKOCYTES UA: NEGATIVE
Nitrite: NEGATIVE
Protein, ur: NEGATIVE mg/dL
Specific Gravity, Urine: 1.02 (ref 1.005–1.030)
pH: 6 (ref 5.0–8.0)

## 2018-05-10 MED ORDER — IOPAMIDOL (ISOVUE-300) INJECTION 61%
INTRAVENOUS | Status: AC
  Filled 2018-05-10: qty 100

## 2018-05-10 MED ORDER — MORPHINE SULFATE (PF) 4 MG/ML IV SOLN
4.0000 mg | Freq: Once | INTRAVENOUS | Status: AC
  Administered 2018-05-10: 4 mg via INTRAVENOUS
  Filled 2018-05-10: qty 1

## 2018-05-10 MED ORDER — METOCLOPRAMIDE HCL 5 MG/ML IJ SOLN
10.0000 mg | Freq: Once | INTRAMUSCULAR | Status: AC
  Administered 2018-05-10: 10 mg via INTRAVENOUS
  Filled 2018-05-10: qty 2

## 2018-05-10 NOTE — ED Provider Notes (Signed)
[facility] DEPT Provider Note   CSN: [id number] Arrival date & time: 05/10/18  2216     History   Chief Complaint Chief Complaint  Patient presents with  . Abdominal Pain    HPI [name] is a 67 y.o. female presenting for evaluation of right upper quadrant pain, nausea, right-sided back cramping, and dark urine.  Patient states that yesterday she woke up and realized that her urine was very dark.  Since then, she has been urinating more frequently than normal.  She then developed right upper quadrant pain with associated nausea.  No vomiting.  She then developed right sided mid to low back cramping.  Abdominal pain and cramping have been intermittent through yesterday and today.  Lasted for approximately 20 minutes before resolving without intervention.  They do not always occur simultaneously.  She denies symptoms on the left side.  She denies fevers, chills, chest pain, shortness of breath.  She denies dysuria, hematuria.  Patient states for the past several days she has been having "milk dud" stools, which is abnormal for her.  Has a history of a cholecystectomy, no other abdominal surgeries.  No other abdominal history.  She sees Dr. Collene Mares with GI.  She was at her primary care doctor on Monday for evaluation of hives, started on size all.  No blood work was done at that time.  No other medication changes recently.  HPI  Past Medical History:  Diagnosis Date  . Anxiety    takes Xanax prn  . Arthritis   . Diverticulosis   . H/O blood transfusion reaction 2010   hives  . H/O migraine    last 65yrs ago  . Headache(784.0)   . Heart murmur    takes Bystolic nightly  . History of blood clots 63yrs   was on Lovenox injections and Coumadin(was only on that for short period of time)  . History of shingles 3-80yrs ago  . Hx of seasonal allergies    takes Levocetirizine prn  . Hyperlipidemia    takes Crestor nightly  . Hypertension    takes  Maxzide daily  . Insomnia    takes Ambien prn  . Joint pain   . Joint swelling   . Trigeminal neuralgia of right side of face 03/26/2016   V2 distribution    Patient Active Problem List   Diagnosis Date Noted  . Cellulitis of foot 08/23/2017  . Hypokalemia 08/23/2017  . Trigeminal neuralgia of right side of face 03/26/2016  . Hyperlipidemia 12/04/2013  . Hypertension 12/04/2013  . Constipation 12/04/2013  . Insomnia 12/04/2013  . Acute blood loss anemia 12/04/2013  . Left hip pain 12/04/2013  . 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 Pain due to total hip replacement ([place]) 04/16/2012    Past Surgical History:  Procedure Laterality Date  . ABDOMINAL HYSTERECTOMY    . BREAST BIOPSY  05/03/2006   [place]  . BREAST REDUCTION SURGERY  1987  . CHOLECYSTECTOMY    . COLONOSCOPY    . ESOPHAGOGASTRODUODENOSCOPY    . EYE SURGERY Right    eye lash removed  . JOINT REPLACEMENT     bilateral hip rt in 2007 and lt in 2010  . NM MYOCAR PERF WALL MOTION  11/04/2008   Normal  . REDUCTION MAMMAPLASTY Bilateral 1988  . REDUCTION MAMMAPLASTY    . SHOULDER ARTHROSCOPY WITH DISTAL CLAVICLE RESECTION Right 09/21/2015   Procedure: RIGHT SHOULDER ARTHROSCOPY WITH DISTAL CLAVICLE EXCISION DEBRIDE LABRAL TEAR ACROMIOPLASTY ;  Surgeon: Frederik Pear, MD;  Location: [place];  Service: Orthopedics;  Laterality: Right;  . TEE WITHOUT CARDIOVERSION N/A 04/12/2015   Procedure: TRANSESOPHAGEAL ECHOCARDIOGRAM (TEE)/BUBBLE STUDY;  Surgeon: Adrian Prows, MD;  Location: [place];  Service: Cardiovascular;  Laterality: N/A;  . TOTAL HIP REVISION  04/14/2012   Procedure: TOTAL HIP REVISION;  Surgeon: Kerin Salen, MD;  Location: [place];  Service: Orthopedics;  Laterality: Right;  right total hip revision  . TOTAL HIP REVISION Left 11/30/2013   Procedure: TOTAL HIP REVISION;  Surgeon: Kerin Salen, MD;  Location: [facility];  Service: Orthopedics;  Laterality: Left;     OB History   None      Home  Medications    Prior to Admission medications   Medication Sig Start Date End Date Taking? Authorizing Provider  Bepotastine Besilate (BEPREVE) 1.5 % SOLN Place 1 drop into both eyes 2 (two) times daily as needed (allergies).     [provider]  cholecalciferol (VITAMIN D) 1000 UNITS tablet Take 5,000 Units by mouth daily.     [provider]  colchicine 0.6 MG tablet Take 1 tablet (0.6 mg total) daily by mouth. 08/29/17   Ghimire, Henreitta Leber, MD  DEXILANT 60 MG capsule Take 1 capsule by mouth daily. 08/19/17   [provider]  gabapentin (NEURONTIN) 100 MG capsule Take 1 capsule (100 mg total) by mouth 2 (two) times daily. Patient not taking: Reported on 03/31/2018 12/05/17   Ward Givens, NP  nebivolol (BYSTOLIC) 5 MG tablet Take 5 mg by mouth every evening. For HTN    [provider]  ondansetron (ZOFRAN ODT) 4 MG disintegrating tablet Take 1 tablet (4 mg total) by mouth every 8 (eight) hours as needed for nausea or vomiting. 07/16/17   Gareth Morgan, MD  rosuvastatin (CRESTOR) 10 MG tablet Take 10 mg by mouth every evening. For hyperlipidemia    [provider]  traMADol (ULTRAM) 50 MG tablet Take 1 tablet (50 mg total) every 6 (six) hours as needed by mouth for moderate pain. 08/29/17   Ghimire, Henreitta Leber, MD  zolpidem (AMBIEN) 5 MG tablet Take 5 mg by mouth at bedtime as needed for sleep.  03/18/16   [provider]    Family History Family History  Problem Relation Age of Onset  . Heart attack Mother   . Alzheimer's disease Mother   . Diabetes Sister   . Hypertension Sister     Social History Social History   Tobacco Use  . Smoking status: Passive Smoke Exposure - Never Smoker  . Smokeless tobacco: Never Used  Substance Use Topics  . Alcohol use: Yes    Comment: occasionally  . Drug use: No     Allergies   Ibuprofen; Penicillins; and Latex   Review of Systems Review of Systems  Gastrointestinal: Positive for  abdominal pain and nausea.  Genitourinary: Positive for frequency. Negative for dysuria and hematuria.       Dark urine  Musculoskeletal: Positive for back pain (cramping).  All other systems reviewed and are negative.    Physical Exam Updated Vital Signs BP 139/76 (BP Location: Left Arm)   Pulse 83   Temp 98.6 F (37 C) (Oral)   Resp 18   SpO2 94%   Physical Exam  Constitutional: She is oriented to person, place, and time. She appears well-developed and well-nourished. No distress.  Appears in no distress  HENT:  Head: Normocephalic and atraumatic.  Eyes: Pupils are equal,  round, and reactive to light. Conjunctivae and EOM are normal.  Neck: Normal range of motion. Neck supple.  Cardiovascular: Normal rate, regular rhythm and intact distal pulses.  Pulmonary/Chest: Effort normal and breath sounds normal. No respiratory distress. She has no wheezes.  Abdominal: Soft. Bowel sounds are normal. She exhibits no distension and no mass. There is tenderness in the right upper quadrant and epigastric area. There is no rebound and no guarding.  Tender to palpation of right upper quadrant and epigastric abdomen.  Soft without rigidity, guarding, distention. No pulsating masses.   Musculoskeletal: Normal range of motion.  TTP of R back musculature.  No pain over midline spine.  Neurological: She is alert and oriented to person, place, and time.  Skin: Skin is warm and dry. Capillary refill takes less than 2 seconds.  Psychiatric: She has a normal mood and affect.  Nursing note and vitals reviewed.    ED Treatments / Results  Labs (all labs ordered are listed, but only abnormal results are displayed) Labs Reviewed  URINE CULTURE  LIPASE, BLOOD  COMPREHENSIVE METABOLIC PANEL  CBC  URINALYSIS, ROUTINE W REFLEX MICROSCOPIC  CK  I-STAT CHEM 8, ED  I-STAT TROPONIN, ED    EKG None  Radiology No results found.  Procedures Procedures (including critical care time)  Medications  Ordered in ED Medications  morphine 4 MG/ML injection 4 mg (has no administration in time range)  metoCLOPramide (REGLAN) injection 10 mg (has no administration in time range)  iopamidol (ISOVUE-300) 61 % injection (has no administration in time range)     Initial Impression / Assessment and Plan / ED Course  I have reviewed the triage vital signs and the nursing notes.  Pertinent labs & imaging results that were available during my care of the patient were reviewed by me and considered in my medical decision making (see chart for details).     Patient presenting for evaluation of dark urine, RUQ pain, nausea, back pain.  Physical exam reassuring, patient is afebrile not tachycardic.  Appears nontoxic.  Tenderness palpation of epigastric and right upper quadrant abdomen.  No pulsating masses.  Blood pressure stable.  Doubt dissection or aneurysm.  Additionally, tenderness palpation of right back musculature, no pain over midline spine.  Broad differential at this time including kidney stone, kidney infection, liver abnormality, rhabdo, dehydration, constipation.  Will obtain labs, urine, and CT abdomen.  Morphine and Reglan given for symptom control.  Case discussed with attending, Dr. Darl Householder agrees to plan.  Chem-8 shows mild elevation of creatinine at 1.3 otherwise reassuring.  Troponin negative.  UA without infection or blood.  Remaining labs pending.  Pt signed out to Tanda Rockers, PA-C for f/u on labs and imaging. If everything is normal, pt to be d/c with f/u with PCP.   Final Clinical Impressions(s) / ED Diagnoses   Final diagnoses:  None    ED Discharge Orders    None       Franchot Heidelberg, PA-C 05/10/18 2359    Ripley Fraise, MD 05/11/18 [phone]

## 2018-05-10 NOTE — ED Triage Notes (Signed)
Right upper abdominal pain, dark colored, right flank pain, and nasuea since yesterday,denies fevers.

## 2018-05-10 NOTE — ED Notes (Signed)
Urine culture sent down with UA. 

## 2018-05-11 ENCOUNTER — Emergency Department (HOSPITAL_COMMUNITY): Payer: Medicare Other

## 2018-05-11 DIAGNOSIS — K760 Fatty (change of) liver, not elsewhere classified: Secondary | ICD-10-CM | POA: Diagnosis not present

## 2018-05-11 DIAGNOSIS — R1011 Right upper quadrant pain: Secondary | ICD-10-CM | POA: Diagnosis not present

## 2018-05-11 LAB — COMPREHENSIVE METABOLIC PANEL
ALT: 233 U/L — ABNORMAL HIGH (ref 0–44)
ANION GAP: 10 (ref 5–15)
AST: 382 U/L — AB (ref 15–41)
Albumin: 2.7 g/dL — ABNORMAL LOW (ref 3.5–5.0)
Alkaline Phosphatase: 106 U/L (ref 38–126)
BUN: 19 mg/dL (ref 8–23)
CHLORIDE: 102 mmol/L (ref 98–111)
CO2: 29 mmol/L (ref 22–32)
Calcium: 8.6 mg/dL — ABNORMAL LOW (ref 8.9–10.3)
Creatinine, Ser: 1.08 mg/dL — ABNORMAL HIGH (ref 0.44–1.00)
GFR calc Af Amer: 60 mL/min — ABNORMAL LOW (ref >60)
GFR, EST NON AFRICAN AMERICAN: 52 mL/min — AB (ref >60)
Glucose, Bld: 139 mg/dL — ABNORMAL HIGH (ref 70–99)
POTASSIUM: 4.5 mmol/L (ref 3.5–5.1)
Sodium: 141 mmol/L (ref 135–145)
Total Bilirubin: 1.4 mg/dL — ABNORMAL HIGH (ref 0.3–1.2)
Total Protein: 4.7 g/dL — ABNORMAL LOW (ref 6.5–8.1)

## 2018-05-11 LAB — CBC
HEMATOCRIT: 38.7 % (ref 36.0–46.0)
HEMOGLOBIN: 13 g/dL (ref 12.0–15.0)
MCH: 31 pg (ref 26.0–34.0)
MCHC: 33.6 g/dL (ref 30.0–36.0)
MCV: 92.1 fL (ref 78.0–100.0)
Platelets: 143 10*3/uL — ABNORMAL LOW (ref 150–400)
RBC: 4.2 MIL/uL (ref 3.87–5.11)
RDW: 16.7 % — AB (ref 11.5–15.5)
WBC: 4.2 10*3/uL (ref 4.0–10.5)

## 2018-05-11 LAB — CK: Total CK: 459 U/L — ABNORMAL HIGH (ref 38–234)

## 2018-05-11 LAB — LIPASE, BLOOD: LIPASE: 43 U/L (ref 11–51)

## 2018-05-11 MED ORDER — SODIUM CHLORIDE 0.9 % IV BOLUS
500.0000 mL | Freq: Once | INTRAVENOUS | Status: AC
  Administered 2018-05-11: 500 mL via INTRAVENOUS

## 2018-05-11 MED ORDER — PROMETHAZINE HCL 12.5 MG PO TABS: 12.5000 mg | ORAL_TABLET | Freq: Four times a day (QID) | ORAL | 0 refills | Status: DC | PRN

## 2018-05-11 MED ORDER — IOPAMIDOL (ISOVUE-300) INJECTION 61%
100.0000 mL | Freq: Once | INTRAVENOUS | Status: AC | PRN
  Administered 2018-05-11: 100 mL via INTRAVENOUS

## 2018-05-11 MED ORDER — PROMETHAZINE HCL 25 MG/ML IJ SOLN
12.5000 mg | Freq: Once | INTRAMUSCULAR | Status: AC
  Administered 2018-05-11: 12.5 mg via INTRAVENOUS
  Filled 2018-05-11: qty 1

## 2018-05-11 MED ORDER — DICYCLOMINE HCL 20 MG PO TABS: 20.0000 mg | ORAL_TABLET | Freq: Two times a day (BID) | ORAL | 0 refills | Status: DC

## 2018-05-11 NOTE — Discharge Instructions (Signed)
Your CAT scan was reassuring.  We did discuss that your liver enzymes were slightly elevated this is likely from your alcohol use.  Would recommend cutting back on this.  This may be the symptoms.  Your hepatitis panel is pending.  Have given you Phenergan to help with the nausea.  This medication make you drowsy so do not drive with it.  Do not take this with Zofran though.  Drink plenty of fluids stay hydrated.  Please follow-up with a GI doctor and your primary care doctor.  May take Motrin for pain.  Return to the ED with any worsening symptoms including worsening pain, vomiting, fevers or for any other reason.

## 2018-05-11 NOTE — ED Provider Notes (Signed)
Care assumed from previous provider PA  [place]. Please see their note for further details to include full history and physical. To summarize in short pt is a 67 year old female presents to the ED with right upper quadrant pain, nausea and dark urine.. Case discussed, plan agreed upon.  At time of care was awaiting lab work and imaging.  Lab work returned.  No leukocytosis.  No significant elevation in patient's creatinine.  AST and ALT are elevated with mild elevation and bilirubin.  No significant elect light derangement.  Normal hemoglobin.  CK mildly elevated at 459.  Normal lipase.  UA shows no signs of infection and troponin was negative.  Patient does report history of elevated liver enzymes.  She states that she is a chronic drinker and has increased her alcohol consumption of the past several weeks secondary to stressors in her life.  This is followed by her primary care doctor.  The patient reports dark urine.  I suspect that her urine is likely dark from mild elevation of bili bilirubin.  Patient has no significant elevated creatinine with a mildly elevated CK.  Doubt rhabdomyolysis.  Patient given fluids and tolerating p.o. fluids.  CT scan returned shows no acute findings.  Unknown cause of patient's symptoms.  I recommend her follow-up with a GI doctor and her primary care doctor.  We will give her Phenergan to help with the nausea at home.  EKG and troponin are normal in the ED low suspicion for ACS.  She feels much improved and ready for discharge.  Doubt pyelonephritis, UTI, cholangitis, she will appendicitis, cholecystitis, obstruction, ACS, PE.  Pt is hemodynamically stable, in NAD, & able to ambulate in the ED. Evaluation does not show pathology that would require ongoing emergent intervention or inpatient treatment. I explained the diagnosis to the patient. Pain has been managed & has no complaints prior to dc. Pt is comfortable with above plan and is stable for discharge at this time.  All questions were answered prior to disposition. Strict return precautions for f/u to the ED were discussed. Encouraged follow up with PCP.  Patient seen and evaluated by attending who is agreed with the above plan.       Doristine Devoid, PA-C 05/11/18 [id number]    Ripley Fraise, MD 05/11/18 [phone]

## 2018-05-11 NOTE — ED Notes (Signed)
Pt given gingerale and crackers for PO challenge

## 2018-05-11 NOTE — ED Notes (Signed)
Pt reports tolerating PO challenge without difficulty.

## 2018-05-11 NOTE — ED Provider Notes (Signed)
Patient appears improved.  She does report mild nausea but otherwise no pain.  We discussed lab findings.  She will try p.o. challenge and likely discharge home.   Ripley Fraise, MD 05/11/18 [phone]

## 2018-05-12 LAB — URINE CULTURE: Culture: <10000 — AB

## 2018-05-12 LAB — HEPATITIS PANEL, ACUTE
HCV Ab: <0.1 s/co ratio (ref 0.0–0.9)
HEP A IGM: NEGATIVE
Hep B C IgM: NEGATIVE
Hepatitis B Surface Ag: NEGATIVE

## 2018-05-14 DIAGNOSIS — Z01818 Encounter for other preprocedural examination: Secondary | ICD-10-CM | POA: Diagnosis not present

## 2018-05-14 DIAGNOSIS — G500 Trigeminal neuralgia: Secondary | ICD-10-CM | POA: Diagnosis not present

## 2018-05-16 DIAGNOSIS — Z7289 Other problems related to lifestyle: Secondary | ICD-10-CM | POA: Diagnosis not present

## 2018-05-16 DIAGNOSIS — Z880 Allergy status to penicillin: Secondary | ICD-10-CM | POA: Diagnosis not present

## 2018-05-16 DIAGNOSIS — Z820 Family history of epilepsy and other diseases of the nervous system: Secondary | ICD-10-CM | POA: Diagnosis not present

## 2018-05-16 DIAGNOSIS — Z48811 Encounter for surgical aftercare following surgery on the nervous system: Secondary | ICD-10-CM | POA: Diagnosis not present

## 2018-05-16 DIAGNOSIS — G500 Trigeminal neuralgia: Secondary | ICD-10-CM | POA: Diagnosis not present

## 2018-05-16 DIAGNOSIS — Z87891 Personal history of nicotine dependence: Secondary | ICD-10-CM | POA: Diagnosis not present

## 2018-05-16 DIAGNOSIS — Z86718 Personal history of other venous thrombosis and embolism: Secondary | ICD-10-CM | POA: Diagnosis not present

## 2018-05-16 DIAGNOSIS — K760 Fatty (change of) liver, not elsewhere classified: Secondary | ICD-10-CM | POA: Diagnosis present

## 2018-05-16 DIAGNOSIS — Z90710 Acquired absence of both cervix and uterus: Secondary | ICD-10-CM | POA: Diagnosis not present

## 2018-05-16 DIAGNOSIS — Z888 Allergy status to other drugs, medicaments and biological substances status: Secondary | ICD-10-CM | POA: Diagnosis not present

## 2018-05-16 DIAGNOSIS — Z8249 Family history of ischemic heart disease and other diseases of the circulatory system: Secondary | ICD-10-CM | POA: Diagnosis not present

## 2018-05-16 DIAGNOSIS — K5730 Diverticulosis of large intestine without perforation or abscess without bleeding: Secondary | ICD-10-CM | POA: Diagnosis present

## 2018-05-16 DIAGNOSIS — I10 Essential (primary) hypertension: Secondary | ICD-10-CM | POA: Diagnosis not present

## 2018-05-16 DIAGNOSIS — E785 Hyperlipidemia, unspecified: Secondary | ICD-10-CM | POA: Diagnosis not present

## 2018-05-16 DIAGNOSIS — Z9049 Acquired absence of other specified parts of digestive tract: Secondary | ICD-10-CM | POA: Diagnosis not present

## 2018-05-16 DIAGNOSIS — Z96649 Presence of unspecified artificial hip joint: Secondary | ICD-10-CM | POA: Diagnosis present

## 2018-05-19 ENCOUNTER — Telehealth: Payer: Self-pay | Admitting: Neurology

## 2018-05-19 NOTE — Telephone Encounter (Signed)
I have received correspondence from Dr. Salomon Fick, the patient had a suboccipital craniotomy on the right for microvascular decompression of the trigeminal nerve for trigeminal neuralgia.

## 2018-05-22 DIAGNOSIS — B028 Zoster with other complications: Secondary | ICD-10-CM | POA: Diagnosis not present

## 2018-05-22 DIAGNOSIS — Z923 Personal history of irradiation: Secondary | ICD-10-CM | POA: Diagnosis not present

## 2018-05-22 DIAGNOSIS — B029 Zoster without complications: Secondary | ICD-10-CM | POA: Diagnosis not present

## 2018-05-22 DIAGNOSIS — B0239 Other herpes zoster eye disease: Secondary | ICD-10-CM | POA: Diagnosis not present

## 2018-05-22 DIAGNOSIS — G500 Trigeminal neuralgia: Secondary | ICD-10-CM | POA: Diagnosis not present

## 2018-05-29 DIAGNOSIS — Z48811 Encounter for surgical aftercare following surgery on the nervous system: Secondary | ICD-10-CM | POA: Diagnosis not present

## 2018-05-29 DIAGNOSIS — Z4802 Encounter for removal of sutures: Secondary | ICD-10-CM | POA: Diagnosis not present

## 2018-06-10 DIAGNOSIS — G500 Trigeminal neuralgia: Secondary | ICD-10-CM | POA: Diagnosis not present

## 2018-06-12 ENCOUNTER — Ambulatory Visit (HOSPITAL_COMMUNITY)
Admission: EM | Admit: 2018-06-12 | Discharge: 2018-06-12 | Disposition: A | Discharge status: Home / Self Care | Payer: Medicare Other | Attending: Family Medicine | Admitting: Family Medicine

## 2018-06-12 ENCOUNTER — Encounter (HOSPITAL_COMMUNITY): Payer: Self-pay | Admitting: Emergency Medicine

## 2018-06-12 DIAGNOSIS — M62830 Muscle spasm of back: Secondary | ICD-10-CM | POA: Diagnosis not present

## 2018-06-12 MED ORDER — CYCLOBENZAPRINE HCL 5 MG PO TABS: 5.0000 mg | ORAL_TABLET | Freq: Two times a day (BID) | ORAL | 0 refills | Status: DC | PRN

## 2018-06-12 NOTE — Discharge Instructions (Addendum)
Continue conservative management of rest, ice, heat and gentle stretches Use OTC tylenol as needed for pain and inflammation Take cyclobenzaprine at nighttime for symptomatic relief. Avoid driving or operating heavy machinery while using medication. Follow up with PCP if symptoms persist Return or go to the ER if you have any new or worsening symptoms (fever, chills, chest pain, abdominal pain, changes in bowel or bladder habits, pain radiating into lower legs, etc...)

## 2018-06-12 NOTE — ED Triage Notes (Addendum)
Pt c/o back spasms that start in the middle of her back, travel around her R rib cage area. Pt states if she holds still it doesn't hurt, but any movement causes pain. Started last night. Took husbands robaxin last night without relief.

## 2018-06-12 NOTE — ED Provider Notes (Signed)
[name]   [id number] 06/12/18 Arrival Time: 1238  CC: "Muscle spasm"  SUBJECTIVE: History from: patient. [name] is a 67 y.o. female hx signifcant for brain surgery in July for trigeminal neuralgia complains of right sided mid back pain that began last night.  Denies a precipitating event or specific injury.  Localizes the pain to the midback.  Describes the pain as intermittent and sharp in character.  Has tried robaxin without relief.  Symptoms are made worse with lumbar ROM.  Denies similar symptoms in the past.  Denies fever, chills, chest pain, SOB, erythema, ecchymosis, effusion, weakness, numbness and tingling, saddle paresthesias, changes in bowel or bladder habits  ROS: As per HPI.  Past Medical History:  Diagnosis Date  . Anxiety    takes Xanax prn  . Arthritis   . Diverticulosis   . H/O blood transfusion reaction 2010   hives  . H/O migraine    last 68yrs ago  . Headache(784.0)   . Heart murmur    takes Bystolic nightly  . History of blood clots 47yrs   was on Lovenox injections and Coumadin(was only on that for short period of time)  . History of shingles 3-4yrs ago  . Hx of seasonal allergies    takes Levocetirizine prn  . Hyperlipidemia    takes Crestor nightly  . Hypertension    takes Maxzide daily  . Insomnia    takes Ambien prn  . Joint pain   . Joint swelling   . Trigeminal neuralgia of right side of face 03/26/2016   V2 distribution   Past Surgical History:  Procedure Laterality Date  . ABDOMINAL HYSTERECTOMY    . BREAST BIOPSY  05/03/2006   [place]  . BREAST REDUCTION SURGERY  1987  . CHOLECYSTECTOMY    . COLONOSCOPY    . ESOPHAGOGASTRODUODENOSCOPY    . EYE SURGERY Right    eye lash removed  . JOINT REPLACEMENT     bilateral hip rt in 2007 and lt in 2010  . NM MYOCAR PERF WALL MOTION  11/04/2008   Normal  . REDUCTION MAMMAPLASTY Bilateral 1988  . REDUCTION MAMMAPLASTY    . SHOULDER ARTHROSCOPY WITH DISTAL CLAVICLE RESECTION  Right 09/21/2015   Procedure: RIGHT SHOULDER ARTHROSCOPY WITH DISTAL CLAVICLE EXCISION DEBRIDE LABRAL TEAR ACROMIOPLASTY ;  Surgeon: Frederik Pear, MD;  Location: [place];  Service: Orthopedics;  Laterality: Right;  . TEE WITHOUT CARDIOVERSION N/A 04/12/2015   Procedure: TRANSESOPHAGEAL ECHOCARDIOGRAM (TEE)/BUBBLE STUDY;  Surgeon: Adrian Prows, MD;  Location: [place];  Service: Cardiovascular;  Laterality: N/A;  . TOTAL HIP REVISION  04/14/2012   Procedure: TOTAL HIP REVISION;  Surgeon: Kerin Salen, MD;  Location: [place];  Service: Orthopedics;  Laterality: Right;  right total hip revision  . TOTAL HIP REVISION Left 11/30/2013   Procedure: TOTAL HIP REVISION;  Surgeon: Kerin Salen, MD;  Location: [place];  Service: Orthopedics;  Laterality: Left;   Allergies  Allergen Reactions  . Ibuprofen Hives and Swelling  . Penicillins Hives and Swelling    Has patient had a PCN reaction causing immediate rash, facial/tongue/throat swelling, SOB or lightheadedness with hypotension: Yes Has patient had a PCN reaction causing severe rash involving mucus membranes or skin necrosis: No Has patient had a PCN reaction that required hospitalization: No Has patient had a PCN reaction occurring within the last 10 years: No If all of the above answers are "NO", then may proceed with Cephalosporin use.   . Latex  Itching    Pt states this is an error, she is not allergic to latex.   No current facility-administered medications on file prior to encounter.    Current Outpatient Medications on File Prior to Encounter  Medication Sig Dispense Refill  . Bepotastine Besilate (BEPREVE) 1.5 % SOLN Place 1 drop into both eyes 2 (two) times daily as needed (allergies).     . cholecalciferol (VITAMIN D) 1000 UNITS tablet Take 5,000 Units by mouth daily.     Marland Kitchen gabapentin (NEURONTIN) 100 MG capsule Take 1 capsule (100 mg total) by mouth 2 (two) times daily. (Patient not taking: Reported on 03/31/2018) 60  capsule 11  . nebivolol (BYSTOLIC) 5 MG tablet Take 5 mg by mouth every evening. For HTN    . ondansetron (ZOFRAN ODT) 4 MG disintegrating tablet Take 1 tablet (4 mg total) by mouth every 8 (eight) hours as needed for nausea or vomiting. 20 tablet 0  . promethazine (PHENERGAN) 12.5 MG tablet Take 1 tablet (12.5 mg total) by mouth every 6 (six) hours as needed for nausea or vomiting. 6 tablet 0  . rosuvastatin (CRESTOR) 10 MG tablet Take 10 mg by mouth every evening. For hyperlipidemia    . traMADol (ULTRAM) 50 MG tablet Take 1 tablet (50 mg total) every 6 (six) hours as needed by mouth for moderate pain. 15 tablet 0  . triamterene-hydrochlorothiazide (DYAZIDE) 37.5-25 MG capsule Take 1 capsule by mouth every morning.    . zolpidem (AMBIEN) 5 MG tablet Take 5 mg by mouth at bedtime as needed for sleep.      Social History   Socioeconomic History  . Marital status: Married    Spouse name: Not on file  . Number of children: 2  . Years of education: 72  . Highest education level: Not on file  Occupational History  . Occupation: Retired  Scientific laboratory technician  . Financial resource strain: Not on file  . Food insecurity:    Worry: Not on file    Inability: Not on file  . Transportation needs:    Medical: Not on file    Non-medical: Not on file  Tobacco Use  . Smoking status: Passive Smoke Exposure - Never Smoker  . Smokeless tobacco: Never Used  Substance and Sexual Activity  . Alcohol use: Yes    Comment: occasionally  . Drug use: No  . Sexual activity: Yes    Birth control/protection: Surgical  Lifestyle  . Physical activity:    Days per week: Not on file    Minutes per session: Not on file  . Stress: Not on file  Relationships  . Social connections:    Talks on phone: Not on file    Gets together: Not on file    Attends religious service: Not on file    Active member of club or organization: Not on file    Attends meetings of clubs or organizations: Not on file    Relationship  status: Not on file  . Intimate partner violence:    Fear of current or ex partner: Not on file    Emotionally abused: Not on file    Physically abused: Not on file    Forced sexual activity: Not on file  Other Topics Concern  . Not on file  Social History Narrative   Lives at home w/ her husband   Right-handed   About 1 cup of coffee every other day   Family History  Problem Relation Age of Onset  . Heart  attack Mother   . Alzheimer's disease Mother   . Diabetes Sister   . Hypertension Sister     OBJECTIVE:  Vitals:   06/12/18 1309  BP: 130/68  Pulse: 96  Resp: 18  Temp: 97.9 F (36.6 C)  SpO2: 100%    General appearance: AOx3; in no acute distress.  Head: NCAT; PERRL; EOM intact Lungs: CTA bilaterally Heart: Murmur present; Radial pulses 2+ bilaterally. Musculoskeletal: Back Inspection: Skin warm, dry, clear and intact without obvious erythema, effusion, or ecchymosis.  Palpation: Tender to palpation over the thoracic left paravertebral muscles ROM: LROM Strength: 5/5 shld abduction, 5/5 shld adduction, 5/5 elbow flexion, 5/5 elbow extension, 5/5 grip strength, 5/5 hip flexion, 5/5 knee abduction, 5/5 knee adduction, 5/5 knee flexion, 5/5 knee extension, 5/5 dorsiflexion, 5/5 plantar flexion Skin: warm and dry Neurologic: Ambulates with minimal difficulty; Sensation intact about the upper/ lower extremities; negative pronator drift; finger to nose without difficulty  Psychological: alert and cooperative; normal mood and affect  ASSESSMENT & PLAN:  1. Muscle spasm of back     Meds ordered this encounter  Medications  . cyclobenzaprine (FLEXERIL) 5 MG tablet    Sig: Take 1 tablet (5 mg total) by mouth 2 (two) times daily as needed for muscle spasms.    Dispense:  14 tablet    Refill:  0    Order Specific Question:   Supervising Provider    Answer:   Wynona Luna [[id number]]   Continue conservative management of rest, ice, heat and gentle stretches Use  OTC tylenol as needed for pain and inflammation Take cyclobenzaprine at nighttime for symptomatic relief. Avoid driving or operating heavy machinery while using medication. Follow up with PCP if symptoms persist Return or go to the ER if you have any new or worsening symptoms (fever, chills, chest pain, abdominal pain, changes in bowel or bladder habits, pain radiating into lower legs, etc...)   Reviewed expectations re: course of current medical issues. Questions answered. Outlined signs and symptoms indicating need for more acute intervention. Patient verbalized understanding. After Visit Summary given.    Lestine Box, PA-C 06/12/18 1358

## 2018-06-19 DIAGNOSIS — G500 Trigeminal neuralgia: Secondary | ICD-10-CM | POA: Diagnosis not present

## 2018-07-25 ENCOUNTER — Encounter (HOSPITAL_COMMUNITY): Payer: Self-pay | Admitting: Emergency Medicine

## 2018-07-25 ENCOUNTER — Emergency Department (HOSPITAL_COMMUNITY): Payer: Medicare Other

## 2018-07-25 ENCOUNTER — Emergency Department (HOSPITAL_COMMUNITY)
Admission: EM | Admit: 2018-07-25 | Discharge: 2018-07-26 | Disposition: A | Discharge status: Home / Self Care | Payer: Medicare Other | Attending: Emergency Medicine | Admitting: Emergency Medicine

## 2018-07-25 DIAGNOSIS — Z86711 Personal history of pulmonary embolism: Secondary | ICD-10-CM | POA: Insufficient documentation

## 2018-07-25 DIAGNOSIS — R079 Chest pain, unspecified: Secondary | ICD-10-CM | POA: Diagnosis not present

## 2018-07-25 DIAGNOSIS — E785 Hyperlipidemia, unspecified: Secondary | ICD-10-CM | POA: Insufficient documentation

## 2018-07-25 DIAGNOSIS — R072 Precordial pain: Secondary | ICD-10-CM | POA: Diagnosis not present

## 2018-07-25 DIAGNOSIS — I10 Essential (primary) hypertension: Secondary | ICD-10-CM | POA: Insufficient documentation

## 2018-07-25 DIAGNOSIS — R7989 Other specified abnormal findings of blood chemistry: Secondary | ICD-10-CM | POA: Diagnosis not present

## 2018-07-25 DIAGNOSIS — R112 Nausea with vomiting, unspecified: Secondary | ICD-10-CM | POA: Diagnosis not present

## 2018-07-25 DIAGNOSIS — R51 Headache: Secondary | ICD-10-CM | POA: Insufficient documentation

## 2018-07-25 DIAGNOSIS — R519 Headache, unspecified: Secondary | ICD-10-CM

## 2018-07-25 DIAGNOSIS — R0789 Other chest pain: Secondary | ICD-10-CM | POA: Diagnosis not present

## 2018-07-25 LAB — BASIC METABOLIC PANEL
Anion gap: 16 — ABNORMAL HIGH (ref 5–15)
BUN: 12 mg/dL (ref 8–23)
CALCIUM: 9.4 mg/dL (ref 8.9–10.3)
CO2: 29 mmol/L (ref 22–32)
CREATININE: 1.11 mg/dL — AB (ref 0.44–1.00)
Chloride: 96 mmol/L — ABNORMAL LOW (ref 98–111)
GFR calc Af Amer: 58 mL/min — ABNORMAL LOW (ref >60)
GFR, EST NON AFRICAN AMERICAN: 50 mL/min — AB (ref >60)
Glucose, Bld: 141 mg/dL — ABNORMAL HIGH (ref 70–99)
Potassium: 3.3 mmol/L — ABNORMAL LOW (ref 3.5–5.1)
SODIUM: 141 mmol/L (ref 135–145)

## 2018-07-25 LAB — CBC
HCT: 41.5 % (ref 36.0–46.0)
Hemoglobin: 13.7 g/dL (ref 12.0–15.0)
MCH: 30.9 pg (ref 26.0–34.0)
MCHC: 33 g/dL (ref 30.0–36.0)
MCV: 93.5 fL (ref 78.0–100.0)
PLATELETS: 137 10*3/uL — AB (ref 150–400)
RBC: 4.44 MIL/uL (ref 3.87–5.11)
RDW: 14.2 % (ref 11.5–15.5)
WBC: 6.7 10*3/uL (ref 4.0–10.5)

## 2018-07-25 LAB — POCT I-STAT TROPONIN I: TROPONIN I, POC: 0 ng/mL (ref 0.00–0.08)

## 2018-07-25 MED ORDER — GI COCKTAIL ~~LOC~~
30.0000 mL | Freq: Once | ORAL | Status: AC
  Administered 2018-07-26: 30 mL via ORAL
  Filled 2018-07-25: qty 30

## 2018-07-25 MED ORDER — OXYCODONE HCL 5 MG PO TABS
5.0000 mg | ORAL_TABLET | Freq: Once | ORAL | Status: AC
  Administered 2018-07-25: 5 mg via ORAL
  Filled 2018-07-25: qty 1

## 2018-07-25 MED ORDER — SODIUM CHLORIDE 0.9 % IV BOLUS: 1000.0000 mL | Freq: Once | INTRAVENOUS | Status: DC

## 2018-07-25 NOTE — ED Triage Notes (Signed)
Patient here from home with complaints of mid chest tightness that started this morning. Nausea, no vomiting. Headache with no relief.

## 2018-07-25 NOTE — ED Provider Notes (Signed)
[facility] Emergency Department Provider Note MRN:  [id number]  Arrival date & time: 07/25/18     Chief Complaint   Chest Pain and Headache   History of Present Illness   [name] is a 67 y.o. year-old female with a history of pulmonary embolism presenting to the ED with chief complaint of chest pain and headache.  Patient woke up this morning with central chest pressure, moderate in severity, constant, nonradiating.  Persistent throughout the day.  Associated with nausea, vomiting.  Also endorsing generalized headache, sudden onset 2 hours ago.  Denies recent fevers, no neck pain, no shortness of breath.  Pain is not worse with deep breaths.  No abdominal pain.  No numbness or weakness in the arms or legs.  Review of Systems  A complete 10 system review of systems was obtained and all systems are negative except as noted in the HPI and PMH.   Patient's Health History    Past Medical History:  Diagnosis Date  . Anxiety    takes Xanax prn  . Arthritis   . Diverticulosis   . H/O blood transfusion reaction 2010   hives  . H/O migraine    last 55yrs ago  . Headache(784.0)   . Heart murmur    takes Bystolic nightly  . History of blood clots 40yrs   was on Lovenox injections and Coumadin(was only on that for short period of time)  . History of shingles 3-28yrs ago  . Hx of seasonal allergies    takes Levocetirizine prn  . Hyperlipidemia    takes Crestor nightly  . Hypertension    takes Maxzide daily  . Insomnia    takes Ambien prn  . Joint pain   . Joint swelling   . Trigeminal neuralgia of right side of face 03/26/2016   V2 distribution    Past Surgical History:  Procedure Laterality Date  . ABDOMINAL HYSTERECTOMY    . BREAST BIOPSY  05/03/2006   [place]  . BREAST REDUCTION SURGERY  1987  . CHOLECYSTECTOMY    . COLONOSCOPY    . ESOPHAGOGASTRODUODENOSCOPY    . EYE SURGERY Right    eye lash removed  . JOINT REPLACEMENT     bilateral hip rt in  2007 and lt in 2010  . NM MYOCAR PERF WALL MOTION  11/04/2008   Normal  . REDUCTION MAMMAPLASTY Bilateral 1988  . REDUCTION MAMMAPLASTY    . SHOULDER ARTHROSCOPY WITH DISTAL CLAVICLE RESECTION Right 09/21/2015   Procedure: RIGHT SHOULDER ARTHROSCOPY WITH DISTAL CLAVICLE EXCISION DEBRIDE LABRAL TEAR ACROMIOPLASTY ;  Surgeon: Frederik Pear, MD;  Location: [place];  Service: Orthopedics;  Laterality: Right;  . TEE WITHOUT CARDIOVERSION N/A 04/12/2015   Procedure: TRANSESOPHAGEAL ECHOCARDIOGRAM (TEE)/BUBBLE STUDY;  Surgeon: Adrian Prows, MD;  Location: [place];  Service: Cardiovascular;  Laterality: N/A;  . TOTAL HIP REVISION  04/14/2012   Procedure: TOTAL HIP REVISION;  Surgeon: Kerin Salen, MD;  Location: [place];  Service: Orthopedics;  Laterality: Right;  right total hip revision  . TOTAL HIP REVISION Left 11/30/2013   Procedure: TOTAL HIP REVISION;  Surgeon: Kerin Salen, MD;  Location: [place];  Service: Orthopedics;  Laterality: Left;    Family History  Problem Relation Age of Onset  . Heart attack Mother   . Alzheimer's disease Mother   . Diabetes Sister   . Hypertension Sister     Social History   Socioeconomic History  . Marital status: Married  Spouse name: Not on file  . Number of children: 2  . Years of education: 15  . Highest education level: Not on file  Occupational History  . Occupation: Retired  Scientific laboratory technician  . Financial resource strain: Not on file  . Food insecurity:    Worry: Not on file    Inability: Not on file  . Transportation needs:    Medical: Not on file    Non-medical: Not on file  Tobacco Use  . Smoking status: Passive Smoke Exposure - Never Smoker  . Smokeless tobacco: Never Used  Substance and Sexual Activity  . Alcohol use: Yes    Comment: occasionally  . Drug use: No  . Sexual activity: Yes    Birth control/protection: Surgical  Lifestyle  . Physical activity:    Days per week: Not on file    Minutes per session: Not on  file  . Stress: Not on file  Relationships  . Social connections:    Talks on phone: Not on file    Gets together: Not on file    Attends religious service: Not on file    Active member of club or organization: Not on file    Attends meetings of clubs or organizations: Not on file    Relationship status: Not on file  . Intimate partner violence:    Fear of current or ex partner: Not on file    Emotionally abused: Not on file    Physically abused: Not on file    Forced sexual activity: Not on file  Other Topics Concern  . Not on file  Social History Narrative   Lives at home w/ her husband   Right-handed   About 1 cup of coffee every other day     Physical Exam  Vital Signs and Nursing Notes reviewed Vitals:   07/25/18 1741 07/25/18 2208  BP: 124/73 125/75  Pulse: (!) 110 95  Resp: 20 16  Temp: 98.5 F (36.9 C)   SpO2: 97% 100%    CONSTITUTIONAL: Well-appearing, NAD NEURO:  Alert and oriented x 3, no focal deficits EYES:  eyes equal and reactive ENT/NECK:  no LAD, no JVD CARDIO: Tachycardic rate, well-perfused, normal S1 and S2 PULM:  CTAB no wheezing or rhonchi GI/GU:  normal bowel sounds, non-distended, non-tender MSK/SPINE:  No gross deformities, no edema SKIN:  no rash, atraumatic PSYCH:  Appropriate speech and behavior  Diagnostic and Interventional Summary    EKG Interpretation  Date/Time:  Friday July 25 2018 17:52:11 EDT Ventricular Rate:  103 PR Interval:    QRS Duration: 79 QT Interval:  366 QTC Calculation: 480 R Axis:   32 Text Interpretation:  Sinus tachycardia Borderline repolarization abnormality Confirmed by Gerlene Fee [phone]) on 07/25/2018 9:53:09 PM      Labs Reviewed  BASIC METABOLIC PANEL - Abnormal; Notable for the following components:      Result Value   Potassium 3.3 (*)    Chloride 96 (*)    Glucose, Bld 141 (*)    Creatinine, Ser 1.11 (*)    GFR calc non Af Amer 50 (*)    GFR calc Af Amer 58 (*)    Anion gap 16 (*)     All other components within normal limits  CBC - Abnormal; Notable for the following components:   Platelets 137 (*)    All other components within normal limits  D-DIMER, QUANTITATIVE (NOT AT [facility])  I-STAT TROPONIN, ED  POCT I-STAT TROPONIN I    CT  HEAD WO CONTRAST  Final Result    DG Chest 2 View  Final Result      Medications  gi cocktail (Maalox,Lidocaine,Donnatal) (has no administration in time range)  oxyCODONE (Oxy IR/ROXICODONE) immediate release tablet 5 mg (5 mg Oral Given 07/25/18 2240)     Procedures Critical Care  ED Course and Medical Decision Making  I have reviewed the triage vital signs and the nursing notes.  Pertinent labs & imaging results that were available during my care of the patient were reviewed by me and considered in my medical decision making (see below for details).  ACS versus PE being considered in the 67 year old female, history of PE, history of hypertension and hyperlipidemia.  Sudden onset headache, patient reports recent "brain surgery" 1 to 2 months ago.  Will CT head to exclude subarachnoid.  CT head with no acute process.  EKG with no ischemic changes, troponin negative.  No evidence to suggest cardiac chest pain, from the standpoint appropriate for outpatient management with close cardiology follow-up.  D-dimer still needed given patient's initial tachycardia, history of pulmonary embolism.  CTA if positive.  If negative, anticipating discharge with close follow-up.  Signed out to Dr. Roxanne Mins at shift change.  Barth Kirks. Sedonia Small, [place] [email or web address]  Final Clinical Impressions(s) / ED Diagnoses     ICD-10-CM   1. Chest pain, unspecified type R07.9   2. Acute nonintractable headache, unspecified headache type R51     ED Discharge Orders    None         Maudie Flakes, MD 07/25/18 2359

## 2018-07-26 ENCOUNTER — Emergency Department (HOSPITAL_COMMUNITY): Payer: Medicare Other

## 2018-07-26 ENCOUNTER — Encounter (HOSPITAL_COMMUNITY): Payer: Self-pay

## 2018-07-26 DIAGNOSIS — R079 Chest pain, unspecified: Secondary | ICD-10-CM | POA: Diagnosis not present

## 2018-07-26 DIAGNOSIS — R7989 Other specified abnormal findings of blood chemistry: Secondary | ICD-10-CM | POA: Diagnosis not present

## 2018-07-26 LAB — D-DIMER, QUANTITATIVE: D-Dimer, Quant: 0.85 ug/mL-FEU — ABNORMAL HIGH (ref 0.00–0.50)

## 2018-07-26 MED ORDER — IOPAMIDOL (ISOVUE-370) INJECTION 76%
INTRAVENOUS | Status: AC
  Filled 2018-07-26: qty 100

## 2018-07-26 MED ORDER — IOPAMIDOL (ISOVUE-370) INJECTION 76%
100.0000 mL | Freq: Once | INTRAVENOUS | Status: AC | PRN
  Administered 2018-07-26: 100 mL via INTRAVENOUS

## 2018-07-26 MED ORDER — ONDANSETRON HCL 4 MG/2ML IJ SOLN
4.0000 mg | Freq: Once | INTRAMUSCULAR | Status: AC
  Administered 2018-07-26: 4 mg via INTRAVENOUS
  Filled 2018-07-26: qty 2

## 2018-07-26 MED ORDER — SODIUM CHLORIDE 0.9 % IJ SOLN
INTRAMUSCULAR | Status: AC
  Filled 2018-07-26: qty 50

## 2018-07-26 MED ORDER — MORPHINE SULFATE (PF) 4 MG/ML IV SOLN
4.0000 mg | Freq: Once | INTRAVENOUS | Status: AC
  Administered 2018-07-26: 4 mg via INTRAVENOUS
  Filled 2018-07-26: qty 1

## 2018-07-26 NOTE — ED Provider Notes (Signed)
Care assumed from Dr. Sedonia Small, patient presenting with atypical chest pain and headache, pending repeat troponin and also pending d-dimer to rule out pulmonary embolism.  Repeat troponin is normal.  D-dimer has come back elevated and she is sent for CT angiogram which shows no evidence of pulmonary embolism or pneumonia.  Patient was advised of this finding.  She states that she still has generalized malaise which started last night.  There has been cough.  This sounds like it is probably a viral syndrome, and patient is advised of this.  She is discharged with instructions to drink fluids, use over-the-counter analgesics as needed for pain.  Return should symptoms worsen.  Otherwise, follow-up with PCP.  Results for orders placed or performed during the hospital encounter of 16/10/96  Basic metabolic panel  Result Value Ref Range   Sodium 141 135 - 145 mmol/L   Potassium 3.3 (L) 3.5 - 5.1 mmol/L   Chloride 96 (L) 98 - 111 mmol/L   CO2 29 22 - 32 mmol/L   Glucose, Bld 141 (H) 70 - 99 mg/dL   BUN 12 8 - 23 mg/dL   Creatinine, Ser 1.11 (H) 0.44 - 1.00 mg/dL   Calcium 9.4 8.9 - 10.3 mg/dL   GFR calc non Af Amer 50 (L) >60 mL/min   GFR calc Af Amer 58 (L) >60 mL/min   Anion gap 16 (H) 5 - 15  CBC  Result Value Ref Range   WBC 6.7 4.0 - 10.5 K/uL   RBC 4.44 3.87 - 5.11 MIL/uL   Hemoglobin 13.7 12.0 - 15.0 g/dL   HCT 41.5 36.0 - 46.0 %   MCV 93.5 78.0 - 100.0 fL   MCH 30.9 26.0 - 34.0 pg   MCHC 33.0 30.0 - 36.0 g/dL   RDW 14.2 11.5 - 15.5 %   Platelets 137 (L) 150 - 400 K/uL  D-dimer, quantitative (not at [facility])  Result Value Ref Range   D-Dimer, Quant 0.85 (H) 0.00 - 0.50 ug/mL-FEU  POCT i-Stat troponin I  Result Value Ref Range   Troponin i, poc 0.00 0.00 - 0.08 ng/mL   Comment 3           Dg Chest 2 View  Result Date: 07/25/2018 CLINICAL DATA:  Substernal chest pain for 1 day. EXAM: CHEST - 2 VIEW COMPARISON:  05/13/2017 FINDINGS: The cardiomediastinal silhouette is within normal  limits. Aortic atherosclerosis is noted. The lungs are clear. No pleural effusion or pneumothorax is identified. No acute osseous abnormality is seen. Right upper quadrant abdominal surgical clips are noted. IMPRESSION: No active cardiopulmonary disease. Electronically Signed   By: Logan Bores M.D.   On: 07/25/2018 18:55   Ct Head Wo Contrast  Result Date: 07/25/2018 CLINICAL DATA:  Worsening headaches, status post brain surgery 4 months ago. EXAM: CT HEAD WITHOUT CONTRAST TECHNIQUE: Contiguous axial images were obtained from the base of the skull through the vertex without intravenous contrast. COMPARISON:  CT HEAD June 10, 2018 FINDINGS: BRAIN: No intraparenchymal hemorrhage, mass effect nor midline shift. The ventricles and sulci are normal for age. Patchy supratentorial white matter hypodensities within normal range for patient's age, though non-specific are most compatible with chronic small vessel ischemic disease. No acute large vascular territory infarcts. No abnormal extra-axial fluid collections; asymmetrically prominent RIGHT posterior fossa extra-axial space without mass effect. Minimal density RIGHT perimesencephalic cistern could reflect a pledget given history trigeminal neuralgia. Basal cisterns are patent. VASCULAR: Moderate calcific atherosclerosis of the carotid siphons. SKULL: No skull fracture.  Status post RIGHT retrosigmoid craniotomy. No significant scalp soft tissue swelling. SINUSES/ORBITS: Mild LEFT maxillary sinus. Paranasal chronic bony sinuses are well aerated remodeling. Mastoid air cells are well aerated. Mastoid air cells are well aerated.The included ocular globes and orbital contents are non-suspicious. OTHER: None. IMPRESSION: 1. No acute intracranial process. 2. 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 3. Otherwise negative non-contrast CT HEAD for age. Electronically Signed   By: Elon Alas M.D.   On: 07/25/2018 22:36   Ct Angio Chest  Pe W And/or Wo Contrast  Result Date: 07/26/2018 CLINICAL DATA:  67 y/o F; PE suspected, intermediate prob, positive D-dimer. EXAM: CT ANGIOGRAPHY CHEST WITH CONTRAST TECHNIQUE: Multidetector CT imaging of the chest was performed using the standard protocol during bolus administration of intravenous contrast. Multiplanar CT image reconstructions and MIPs were obtained to evaluate the vascular anatomy. CONTRAST:  165mL ISOVUE-370 IOPAMIDOL (ISOVUE-370) INJECTION 76% COMPARISON:  07/25/2018 chest radiograph. FINDINGS: Cardiovascular: Satisfactory opacification of the pulmonary arteries to the segmental level. No evidence of pulmonary embolism. Normal heart size. No pericardial effusion. Mild aortic and coronary artery calcific atherosclerosis. Mediastinum/Nodes: No enlarged mediastinal, hilar, or axillary lymph nodes. Thyroid gland, trachea, and esophagus demonstrate no significant findings. Lungs/Pleura: Lungs are clear. No pleural effusion or pneumothorax. Upper Abdomen: Cholecystectomy clips. Musculoskeletal: No chest wall abnormality. No acute or significant osseous findings. Review of the MIP images confirms the above findings. IMPRESSION: 1. No pulmonary embolus identified. 2. Mild aortic and coronary artery calcific atherosclerosis. 3. Clear lungs. 4. Otherwise unremarkable CTA of the chest. Electronically Signed   By: Kristine Garbe M.D.   On: 30/16/0109 32:35      Delora Fuel, MD 57/32/20 [phone]

## 2018-07-26 NOTE — Discharge Instructions (Addendum)
Drink plenty of fluids.  Take ibuprofen or acetaminophen as needed for pain.  Return if symptoms are getting worse.

## 2018-07-29 DIAGNOSIS — I071 Rheumatic tricuspid insufficiency: Secondary | ICD-10-CM | POA: Diagnosis not present

## 2018-07-29 DIAGNOSIS — I10 Essential (primary) hypertension: Secondary | ICD-10-CM | POA: Diagnosis not present

## 2018-08-11 DIAGNOSIS — R51 Headache: Secondary | ICD-10-CM | POA: Diagnosis not present

## 2018-08-11 DIAGNOSIS — Z9889 Other specified postprocedural states: Secondary | ICD-10-CM | POA: Diagnosis not present

## 2018-09-17 ENCOUNTER — Emergency Department (HOSPITAL_COMMUNITY): Payer: Medicare Other

## 2018-09-17 ENCOUNTER — Ambulatory Visit (INDEPENDENT_AMBULATORY_CARE_PROVIDER_SITE_OTHER)
Admission: EM | Admit: 2018-09-17 | Discharge: 2018-09-17 | Disposition: A | Discharge status: Home / Self Care | Payer: Medicare Other | Source: Home / Self Care | Attending: Family Medicine | Admitting: Family Medicine

## 2018-09-17 ENCOUNTER — Emergency Department (HOSPITAL_COMMUNITY)
Admission: EM | Admit: 2018-09-17 | Discharge: 2018-09-17 | Disposition: A | Discharge status: Home / Self Care | Payer: Medicare Other | Attending: Emergency Medicine | Admitting: Emergency Medicine

## 2018-09-17 ENCOUNTER — Encounter (HOSPITAL_COMMUNITY): Payer: Self-pay | Admitting: Emergency Medicine

## 2018-09-17 ENCOUNTER — Encounter (HOSPITAL_COMMUNITY): Payer: Self-pay | Admitting: Physician Assistant

## 2018-09-17 ENCOUNTER — Other Ambulatory Visit: Payer: Self-pay

## 2018-09-17 DIAGNOSIS — R1031 Right lower quadrant pain: Secondary | ICD-10-CM

## 2018-09-17 DIAGNOSIS — I10 Essential (primary) hypertension: Secondary | ICD-10-CM | POA: Insufficient documentation

## 2018-09-17 DIAGNOSIS — Z8249 Family history of ischemic heart disease and other diseases of the circulatory system: Secondary | ICD-10-CM | POA: Insufficient documentation

## 2018-09-17 DIAGNOSIS — F419 Anxiety disorder, unspecified: Secondary | ICD-10-CM | POA: Insufficient documentation

## 2018-09-17 DIAGNOSIS — E785 Hyperlipidemia, unspecified: Secondary | ICD-10-CM | POA: Insufficient documentation

## 2018-09-17 DIAGNOSIS — Z833 Family history of diabetes mellitus: Secondary | ICD-10-CM | POA: Insufficient documentation

## 2018-09-17 DIAGNOSIS — Z96643 Presence of artificial hip joint, bilateral: Secondary | ICD-10-CM

## 2018-09-17 DIAGNOSIS — Z880 Allergy status to penicillin: Secondary | ICD-10-CM | POA: Insufficient documentation

## 2018-09-17 DIAGNOSIS — Z820 Family history of epilepsy and other diseases of the nervous system: Secondary | ICD-10-CM | POA: Insufficient documentation

## 2018-09-17 DIAGNOSIS — M1990 Unspecified osteoarthritis, unspecified site: Secondary | ICD-10-CM

## 2018-09-17 DIAGNOSIS — G4700 Insomnia, unspecified: Secondary | ICD-10-CM | POA: Insufficient documentation

## 2018-09-17 DIAGNOSIS — R112 Nausea with vomiting, unspecified: Secondary | ICD-10-CM | POA: Insufficient documentation

## 2018-09-17 DIAGNOSIS — Z7722 Contact with and (suspected) exposure to environmental tobacco smoke (acute) (chronic): Secondary | ICD-10-CM | POA: Insufficient documentation

## 2018-09-17 DIAGNOSIS — K5730 Diverticulosis of large intestine without perforation or abscess without bleeding: Secondary | ICD-10-CM | POA: Diagnosis not present

## 2018-09-17 DIAGNOSIS — R197 Diarrhea, unspecified: Secondary | ICD-10-CM | POA: Insufficient documentation

## 2018-09-17 DIAGNOSIS — Z886 Allergy status to analgesic agent status: Secondary | ICD-10-CM

## 2018-09-17 DIAGNOSIS — Z86718 Personal history of other venous thrombosis and embolism: Secondary | ICD-10-CM

## 2018-09-17 DIAGNOSIS — Z79899 Other long term (current) drug therapy: Secondary | ICD-10-CM | POA: Insufficient documentation

## 2018-09-17 LAB — CBC WITH DIFFERENTIAL/PLATELET
Basophils Absolute: 0 10*3/uL (ref 0.0–0.1)
Basophils Relative: 0 %
Eosinophils Absolute: 0 10*3/uL (ref 0.0–0.5)
Eosinophils Relative: 0 %
HCT: 40.2 % (ref 36.0–46.0)
Hemoglobin: 13.2 g/dL (ref 12.0–15.0)
Lymphocytes Relative: 37 %
Lymphs Abs: 1.9 10*3/uL (ref 0.7–4.0)
MCH: 29.9 pg (ref 26.0–34.0)
MCHC: 32.8 g/dL (ref 30.0–36.0)
MCV: 91.2 fL (ref 80.0–100.0)
Monocytes Absolute: 0.3 10*3/uL (ref 0.1–1.0)
Monocytes Relative: 6 %
Neutro Abs: 2.9 10*3/uL (ref 1.7–7.7)
Neutrophils Relative %: 57 %
Platelets: 172 10*3/uL (ref 150–400)
RBC: 4.41 MIL/uL (ref 3.87–5.11)
RDW: 15.5 % (ref 11.5–15.5)
WBC: 5.1 10*3/uL (ref 4.0–10.5)
nRBC: 0.6 % — ABNORMAL HIGH (ref 0.0–0.2)
nRBC: 1 /100 WBC — ABNORMAL HIGH

## 2018-09-17 LAB — COMPREHENSIVE METABOLIC PANEL
ALT: 52 U/L — ABNORMAL HIGH (ref 0–44)
AST: 109 U/L — ABNORMAL HIGH (ref 15–41)
Albumin: 3.4 g/dL — ABNORMAL LOW (ref 3.5–5.0)
Alkaline Phosphatase: 73 U/L (ref 38–126)
Anion gap: 12 (ref 5–15)
BUN: 18 mg/dL (ref 8–23)
CO2: 25 mmol/L (ref 22–32)
Calcium: 8.5 mg/dL — ABNORMAL LOW (ref 8.9–10.3)
Chloride: 99 mmol/L (ref 98–111)
Creatinine, Ser: 1.03 mg/dL — ABNORMAL HIGH (ref 0.44–1.00)
GFR calc Af Amer: >60 mL/min (ref >60)
GFR calc non Af Amer: 56 mL/min — ABNORMAL LOW (ref >60)
Glucose, Bld: 88 mg/dL (ref 70–99)
Potassium: 3.6 mmol/L (ref 3.5–5.1)
Sodium: 136 mmol/L (ref 135–145)
Total Bilirubin: 1.6 mg/dL — ABNORMAL HIGH (ref 0.3–1.2)
Total Protein: 6.2 g/dL — ABNORMAL LOW (ref 6.5–8.1)

## 2018-09-17 LAB — URINALYSIS, ROUTINE W REFLEX MICROSCOPIC
Bilirubin Urine: NEGATIVE
Glucose, UA: NEGATIVE mg/dL
Hgb urine dipstick: NEGATIVE
Ketones, ur: 5 mg/dL — AB
Leukocytes, UA: NEGATIVE
Nitrite: NEGATIVE
Protein, ur: NEGATIVE mg/dL
Specific Gravity, Urine: >1.046 — ABNORMAL HIGH (ref 1.005–1.030)
pH: 6 (ref 5.0–8.0)

## 2018-09-17 LAB — LIPASE, BLOOD: Lipase: 27 U/L (ref 11–51)

## 2018-09-17 LAB — POCT RAPID STREP A: Streptococcus, Group A Screen (Direct): NEGATIVE

## 2018-09-17 MED ORDER — MORPHINE SULFATE (PF) 4 MG/ML IV SOLN
4.0000 mg | Freq: Once | INTRAVENOUS | Status: AC
  Administered 2018-09-17: 4 mg via INTRAVENOUS
  Filled 2018-09-17: qty 1

## 2018-09-17 MED ORDER — ONDANSETRON HCL 4 MG PO TABS: 4.0000 mg | ORAL_TABLET | Freq: Four times a day (QID) | ORAL | 0 refills | Status: DC

## 2018-09-17 MED ORDER — ONDANSETRON HCL 4 MG/2ML IJ SOLN
4.0000 mg | Freq: Once | INTRAMUSCULAR | Status: AC
  Administered 2018-09-17: 4 mg via INTRAVENOUS
  Filled 2018-09-17: qty 2

## 2018-09-17 MED ORDER — IOHEXOL 300 MG/ML  SOLN
100.0000 mL | Freq: Once | INTRAMUSCULAR | Status: AC | PRN
  Administered 2018-09-17: 100 mL via INTRAVENOUS

## 2018-09-17 NOTE — ED Notes (Signed)
Got patient on the monitor undress into a gown patient is resting with call bell in reach 

## 2018-09-17 NOTE — ED Notes (Signed)
Two attemps no blood returns

## 2018-09-17 NOTE — Discharge Instructions (Signed)
Given RLQ pain, please got to the emergency department for further evaluation needed.

## 2018-09-17 NOTE — ED Provider Notes (Addendum)
[facility] EMERGENCY DEPARTMENT Provider Note   CSN: [id number] Arrival date & time: 09/17/18  1240     History   Chief Complaint Chief Complaint  Patient presents with  . Abdominal Pain    HPI [name] is a 67 y.o. female.  HPI   67 year old female presents today with complaints of right lower abdominal pain.  Patient notes symptoms started approximately 3 days ago with nausea vomiting and diarrhea.  Patient denies any vomiting or diarrhea today but notes she is nauseous.  She notes pain is in the right lower quadrant worse with movement or palpation.  She denies any urinary symptoms vaginal discharge or bleeding.  She denies any blood in her vomit or diarrhea.  She denies any fever.  Patient notes yesterday very minor sore throat and rhinorrhea with dry nonproductive cough yesterday.    Past Medical History:  Diagnosis Date  . Anxiety    takes Xanax prn  . Arthritis   . Diverticulosis   . H/O blood transfusion reaction 2010   hives  . H/O migraine    last 10yrs ago  . Headache(784.0)   . Heart murmur    takes Bystolic nightly  . History of blood clots 43yrs   was on Lovenox injections and Coumadin(was only on that for short period of time)  . History of shingles 3-33yrs ago  . Hx of seasonal allergies    takes Levocetirizine prn  . Hyperlipidemia    takes Crestor nightly  . Hypertension    takes Maxzide daily  . Insomnia    takes Ambien prn  . Joint pain   . Joint swelling   . Trigeminal neuralgia of right side of face 03/26/2016   V2 distribution    Patient Active Problem List   Diagnosis Date Noted  . Cellulitis of foot 08/23/2017  . Hypokalemia 08/23/2017  . Trigeminal neuralgia of right side of face 03/26/2016  . Hyperlipidemia 12/04/2013  . Hypertension 12/04/2013  . Constipation 12/04/2013  . Insomnia 12/04/2013  . Acute blood loss anemia 12/04/2013  . Left hip pain 12/04/2013  . 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  Pain due to total hip replacement ([place]) 04/16/2012    Past Surgical History:  Procedure Laterality Date  . ABDOMINAL HYSTERECTOMY    . BREAST BIOPSY  05/03/2006   [place]  . BREAST REDUCTION SURGERY  1987  . CHOLECYSTECTOMY    . COLONOSCOPY    . ESOPHAGOGASTRODUODENOSCOPY    . EYE SURGERY Right    eye lash removed  . JOINT REPLACEMENT     bilateral hip rt in 2007 and lt in 2010  . NM MYOCAR PERF WALL MOTION  11/04/2008   Normal  . REDUCTION MAMMAPLASTY Bilateral 1988  . REDUCTION MAMMAPLASTY    . SHOULDER ARTHROSCOPY WITH DISTAL CLAVICLE RESECTION Right 09/21/2015   Procedure: RIGHT SHOULDER ARTHROSCOPY WITH DISTAL CLAVICLE EXCISION DEBRIDE LABRAL TEAR ACROMIOPLASTY ;  Surgeon: Frederik Pear, MD;  Location: [facility];  Service: Orthopedics;  Laterality: Right;  . TEE WITHOUT CARDIOVERSION N/A 04/12/2015   Procedure: TRANSESOPHAGEAL ECHOCARDIOGRAM (TEE)/BUBBLE STUDY;  Surgeon: Adrian Prows, MD;  Location: [facility];  Service: Cardiovascular;  Laterality: N/A;  . TOTAL HIP REVISION  04/14/2012   Procedure: TOTAL HIP REVISION;  Surgeon: Kerin Salen, MD;  Location: [facility];  Service: Orthopedics;  Laterality: Right;  right total hip revision  . TOTAL HIP REVISION Left 11/30/2013   Procedure: TOTAL HIP REVISION;  Surgeon: Pilar Plate  Karmen Bongo, MD;  Location: [facility];  Service: Orthopedics;  Laterality: Left;     OB History   None      Home Medications    Prior to Admission medications   Medication Sig Start Date End Date Taking? Authorizing Provider  cholecalciferol (VITAMIN D) 1000 UNITS tablet Take 1,000 Units by mouth daily.     [provider]  cyclobenzaprine (FLEXERIL) 5 MG tablet Take 1 tablet (5 mg total) by mouth 2 (two) times daily as needed for muscle spasms. 06/12/18   Wurst, [place], PA-C  nebivolol (BYSTOLIC) 5 MG tablet Take 5 mg by mouth every evening. For HTN    [provider]  rosuvastatin (CRESTOR) 10 MG tablet Take 10 mg by mouth every evening.  For hyperlipidemia    [provider]  triamterene-hydrochlorothiazide (DYAZIDE) 37.5-25 MG capsule Take 1 capsule by mouth every morning.    [provider]  zolpidem (AMBIEN) 5 MG tablet Take 5 mg by mouth at bedtime as needed for sleep.  03/18/16   [provider]    Family History Family History  Problem Relation Age of Onset  . Heart attack Mother   . Alzheimer's disease Mother   . Diabetes Sister   . Hypertension Sister     Social History Social History   Tobacco Use  . Smoking status: Passive Smoke Exposure - Never Smoker  . Smokeless tobacco: Never Used  Substance Use Topics  . Alcohol use: Yes    Comment: occasionally  . Drug use: No     Allergies   Ibuprofen and Penicillins   Review of Systems Review of Systems  All other systems reviewed and are negative.   Physical Exam Updated Vital Signs BP 129/74   Pulse 96   Temp 97.9 F (36.6 C) (Oral)   Resp 18   Ht 4' 11.5" (1.511 m)   Wt 68.9 kg   SpO2 91%   BMI 30.19 kg/m   Physical Exam  Constitutional: She is oriented to person, place, and time. She appears well-developed and well-nourished.  HENT:  Head: Normocephalic and atraumatic.  Oropharynx clear no swelling or edema  Eyes: Pupils are equal, round, and reactive to light. Conjunctivae are normal. Right eye exhibits no discharge. Left eye exhibits no discharge. No scleral icterus.  Neck: Normal range of motion. No JVD present. No tracheal deviation present.  Cardiovascular: Regular rhythm, normal heart sounds and intact distal pulses. Exam reveals no gallop and no friction rub.  No murmur heard. Pulmonary/Chest: Effort normal. No stridor. No respiratory distress. She has no wheezes. She has no rales. She exhibits no tenderness.  Abdominal:  Tenderness palpation right lower quadrant remainder abdomen soft nontender  Neurological: She is alert and oriented to person, place, and time. Coordination normal.  Psychiatric: She  has a normal mood and affect. Her behavior is normal. Judgment and thought content normal.  Nursing note and vitals reviewed.   ED Treatments / Results  Labs (all labs ordered are listed, but only abnormal results are displayed) Labs Reviewed  CBC WITH DIFFERENTIAL/PLATELET - Abnormal; Notable for the following components:      Result Value   nRBC 0.6 (*)    All other components within normal limits  COMPREHENSIVE METABOLIC PANEL - Abnormal; Notable for the following components:   Creatinine, Ser 1.03 (*)    Calcium 8.5 (*)    Total Protein 6.2 (*)    Albumin 3.4 (*)    AST 109 (*)    ALT  52 (*)    Total Bilirubin 1.6 (*)    GFR calc non Af Amer 56 (*)    All other components within normal limits  LIPASE, BLOOD    EKG None  Radiology No results found.  Procedures Procedures (including critical care time)  Medications Ordered in ED Medications  morphine 4 MG/ML injection 4 mg (4 mg Intravenous Given 09/17/18 1334)  ondansetron (ZOFRAN) injection 4 mg (4 mg Intravenous Given 09/17/18 1334)     Initial Impression / Assessment and Plan / ED Course  I have reviewed the triage vital signs and the nursing notes.  Pertinent labs & imaging results that were available during my care of the patient were reviewed by me and considered in my medical decision making (see chart for details).     Labs: CBC, CMP, lipase  Imaging: CT abdomen pelvis with contrast  Consults:  Therapeutics: Zofran, morphine  Discharge Meds:   Assessment/Plan: 35-year-old female presents today with complaints of abdominal pain.  Patient has right lower quadrant abdominal pain.  No rebound or guarding.  She is afebrile with no tachycardia or elevated white count.  Concern for appendicitis versus diverticulitis.  Patient has no pelvic complaints.  Patient will have CT scan.  Patient care signed to oncoming provider pending CT evaluation.     Final Clinical Impressions(s) / ED Diagnoses   Final  diagnoses:  RLQ abdominal pain    ED Discharge Orders    None          Francee Gentile 09/17/18 [place], DO 09/17/18 1840

## 2018-09-17 NOTE — ED Triage Notes (Signed)
Pt cc diarrhea, stomach x 3 days and sore throat that started last night.

## 2018-09-17 NOTE — ED Provider Notes (Signed)
[facility]    CSN: [id number] Arrival date & time: 09/17/18  1129     History   Chief Complaint No chief complaint on file.   HPI [name] is a 67 y.o. female.   67 year old female with history of arthritis, diverticulitis, HLD, HTN comes in for 4-day history of nausea, vomiting, diarrhea and 1 day history of URI symptoms.  States she has right lower quadrant pain that is constant without obvious aggravating or alleviating factor.  She has had about 5 episodes of loose watery stool every day, and 3-4 episodes of nonbilious nonbloody vomit each day.  She states has not had any vomiting today, but has not tried to eat or drink.  She denies melena, hematochezia.  States yesterday started having URI symptoms including sore throat, rhinorrhea, nasal congestion, cough.  Denies fever, chills, night sweats.  She denies urinary symptoms such as frequency, dysuria, hematuria.  She has a history of vaginal hysterectomy.  No history of abdominal surgeries.     Past Medical History:  Diagnosis Date  . Anxiety    takes Xanax prn  . Arthritis   . Diverticulosis   . H/O blood transfusion reaction 2010   hives  . H/O migraine    last 29yrs ago  . Headache(784.0)   . Heart murmur    takes Bystolic nightly  . History of blood clots 29yrs   was on Lovenox injections and Coumadin(was only on that for short period of time)  . History of shingles 3-36yrs ago  . Hx of seasonal allergies    takes Levocetirizine prn  . Hyperlipidemia    takes Crestor nightly  . Hypertension    takes Maxzide daily  . Insomnia    takes Ambien prn  . Joint pain   . Joint swelling   . Trigeminal neuralgia of right side of face 03/26/2016   V2 distribution    Patient Active Problem List   Diagnosis Date Noted  . Cellulitis of foot 08/23/2017  . Hypokalemia 08/23/2017  . Trigeminal neuralgia of right side of face 03/26/2016  . Hyperlipidemia 12/04/2013  . Hypertension 12/04/2013  .  Constipation 12/04/2013  . Insomnia 12/04/2013  . Acute blood loss anemia 12/04/2013  . Left hip pain 12/04/2013  . 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 Pain due to total hip replacement ([place]) 04/16/2012    Past Surgical History:  Procedure Laterality Date  . ABDOMINAL HYSTERECTOMY    . BREAST BIOPSY  05/03/2006   [place]  . BREAST REDUCTION SURGERY  1987  . CHOLECYSTECTOMY    . COLONOSCOPY    . ESOPHAGOGASTRODUODENOSCOPY    . EYE SURGERY Right    eye lash removed  . JOINT REPLACEMENT     bilateral hip rt in 2007 and lt in 2010  . NM MYOCAR PERF WALL MOTION  11/04/2008   Normal  . REDUCTION MAMMAPLASTY Bilateral 1988  . REDUCTION MAMMAPLASTY    . SHOULDER ARTHROSCOPY WITH DISTAL CLAVICLE RESECTION Right 09/21/2015   Procedure: RIGHT SHOULDER ARTHROSCOPY WITH DISTAL CLAVICLE EXCISION DEBRIDE LABRAL TEAR ACROMIOPLASTY ;  Surgeon: Frederik Pear, MD;  Location: [facility];  Service: Orthopedics;  Laterality: Right;  . TEE WITHOUT CARDIOVERSION N/A 04/12/2015   Procedure: TRANSESOPHAGEAL ECHOCARDIOGRAM (TEE)/BUBBLE STUDY;  Surgeon: Adrian Prows, MD;  Location: [facility];  Service: Cardiovascular;  Laterality: N/A;  . TOTAL HIP REVISION  04/14/2012   Procedure: TOTAL HIP REVISION;  Surgeon: Kerin Salen, MD;  Location: [facility];  Service: Orthopedics;  Laterality: Right;  right total hip revision  . TOTAL HIP REVISION Left 11/30/2013   Procedure: TOTAL HIP REVISION;  Surgeon: Kerin Salen, MD;  Location: [facility];  Service: Orthopedics;  Laterality: Left;    OB History   None      Home Medications    Prior to Admission medications   Medication Sig Start Date End Date Taking? Authorizing Provider  cholecalciferol (VITAMIN D) 1000 UNITS tablet Take 1,000 Units by mouth daily.     [provider]  cyclobenzaprine (FLEXERIL) 5 MG tablet Take 1 tablet (5 mg total) by mouth 2 (two) times daily as needed for muscle spasms. 06/12/18   Wurst, [place], PA-C  nebivolol  (BYSTOLIC) 5 MG tablet Take 5 mg by mouth every evening. For HTN    [provider]  rosuvastatin (CRESTOR) 10 MG tablet Take 10 mg by mouth every evening. For hyperlipidemia    [provider]  triamterene-hydrochlorothiazide (DYAZIDE) 37.5-25 MG capsule Take 1 capsule by mouth every morning.    [provider]  zolpidem (AMBIEN) 5 MG tablet Take 5 mg by mouth at bedtime as needed for sleep.  03/18/16   [provider]    Family History Family History  Problem Relation Age of Onset  . Heart attack Mother   . Alzheimer's disease Mother   . Diabetes Sister   . Hypertension Sister     Social History Social History   Tobacco Use  . Smoking status: Passive Smoke Exposure - Never Smoker  . Smokeless tobacco: Never Used  Substance Use Topics  . Alcohol use: Yes    Comment: occasionally  . Drug use: No     Allergies   Ibuprofen and Penicillins   Review of Systems Review of Systems  Reason unable to perform ROS: See HPI as above.     Physical Exam Triage Vital Signs ED Triage Vitals  Enc Vitals Group     BP 09/17/18 1149 109/77     Pulse Rate 09/17/18 1149 93     Resp 09/17/18 1149 18     Temp 09/17/18 1149 98.3 F (36.8 C)     Temp Source 09/17/18 1149 Oral     SpO2 09/17/18 1149 100 %     Weight --      Height --      Head Circumference --      Peak Flow --      Pain Score 09/17/18 1150 5     Pain Loc --      Pain Edu? --      Excl. in [place]? --    No data found.  Updated Vital Signs BP 109/77 (BP Location: Right Arm)   Pulse 93   Temp 98.3 F (36.8 C) (Oral)   Resp 18   SpO2 100%   Physical Exam  Constitutional: She is oriented to person, place, and time. She appears well-developed and well-nourished. No distress.  HENT:  Head: Normocephalic and atraumatic.  Right Ear: Tympanic membrane, external ear and ear canal normal. Tympanic membrane is not erythematous and not bulging.  Left Ear: Tympanic membrane, external ear  and ear canal normal. Tympanic membrane is not erythematous and not bulging.  Nose: Nose normal. Right sinus exhibits no maxillary sinus tenderness and no frontal sinus tenderness. Left sinus exhibits no maxillary sinus tenderness and no frontal sinus tenderness.  Mouth/Throat: Uvula is midline, oropharynx is clear and moist and mucous membranes are normal.  Eyes: Pupils are equal,  round, and reactive to light. Conjunctivae are normal.  Neck: Normal range of motion. Neck supple.  Cardiovascular: Normal rate, regular rhythm and normal heart sounds. Exam reveals no gallop and no friction rub.  No murmur heard. Pulmonary/Chest: Effort normal and breath sounds normal. She has no decreased breath sounds. She has no wheezes. She has no rhonchi. She has no rales.  Abdominal: Soft. Bowel sounds are increased.  Right lower quadrant pain with rebound.  No obvious guarding.  No CVA tenderness.  No rigidity.  Lymphadenopathy:    She has no cervical adenopathy.  Neurological: She is alert and oriented to person, place, and time.  Skin: Skin is warm and dry.  Psychiatric: She has a normal mood and affect. Her behavior is normal. Judgment normal.     UC Treatments / Results  Labs (all labs ordered are listed, but only abnormal results are displayed) Labs Reviewed  CULTURE, GROUP A STREP [facility])  POCT RAPID STREP A    EKG None  Radiology No results found.  Procedures Procedures (including critical care time)  Medications Ordered in UC Medications - No data to display  Initial Impression / Assessment and Plan / UC Course  I have reviewed the triage vital signs and the nursing notes.  Pertinent labs & imaging results that were available during my care of the patient were reviewed by me and considered in my medical decision making (see chart for details).    Given RLQ pain with rebound, discussed cannot rule out appendicitis. Patient would like further evaluation, and was discharged to the ED  in stable condition for further evaluation and management needed.  Final Clinical Impressions(s) / UC Diagnoses   Final diagnoses:  RLQ abdominal pain  Nausea vomiting and diarrhea    ED Prescriptions    None        Ok Edwards, PA-C 09/17/18 1233

## 2018-09-17 NOTE — Discharge Instructions (Signed)
Please read attached information. If you experience any new or worsening signs or symptoms please return to the emergency room for evaluation. Please follow-up with your primary care provider or specialist as discussed. Please use medication prescribed only as directed and discontinue taking if you have any concerning signs or symptoms.   °

## 2018-09-17 NOTE — ED Notes (Signed)
Patient verbalizes understanding of discharge instructions. Opportunity for questioning and answers were provided. Armband removed by staff, pt discharged from ED ambulatory.   

## 2018-09-17 NOTE — ED Triage Notes (Signed)
Patient presents with complaints of right lower quadrant  abdominal pain with associated nausea. Vomiting, and diarrhea yesterday 3x. Reports sore throat. Reports being able to keep food down, but not having an appetite for food. Reports increased in weakness.

## 2018-09-20 LAB — CULTURE, GROUP A STREP (THRC)

## 2018-10-29 ENCOUNTER — Encounter: Payer: Self-pay | Admitting: Internal Medicine

## 2018-10-29 ENCOUNTER — Ambulatory Visit (INDEPENDENT_AMBULATORY_CARE_PROVIDER_SITE_OTHER): Payer: Medicare Other | Admitting: Internal Medicine

## 2018-10-29 VITALS — BP 108/74 | HR 94 | Temp 97.9°F | Ht 59.5 in | Wt 147.0 lb

## 2018-10-29 DIAGNOSIS — G500 Trigeminal neuralgia: Secondary | ICD-10-CM | POA: Diagnosis not present

## 2018-10-29 DIAGNOSIS — R634 Abnormal weight loss: Secondary | ICD-10-CM | POA: Diagnosis not present

## 2018-10-29 DIAGNOSIS — Z7289 Other problems related to lifestyle: Secondary | ICD-10-CM | POA: Diagnosis not present

## 2018-10-29 DIAGNOSIS — Z79899 Other long term (current) drug therapy: Secondary | ICD-10-CM | POA: Diagnosis not present

## 2018-10-29 DIAGNOSIS — N644 Mastodynia: Secondary | ICD-10-CM

## 2018-10-29 DIAGNOSIS — G894 Chronic pain syndrome: Secondary | ICD-10-CM | POA: Diagnosis not present

## 2018-10-29 DIAGNOSIS — Z789 Other specified health status: Secondary | ICD-10-CM

## 2018-10-29 DIAGNOSIS — G4459 Other complicated headache syndrome: Secondary | ICD-10-CM | POA: Diagnosis not present

## 2018-10-30 ENCOUNTER — Ambulatory Visit: Payer: Medicare Other | Admitting: Internal Medicine

## 2018-10-30 ENCOUNTER — Ambulatory Visit: Payer: Medicare Other

## 2018-11-06 ENCOUNTER — Telehealth: Payer: Self-pay

## 2018-11-06 MED ORDER — VITAMIN B-1 100 MG PO TABS: 100.0000 mg | ORAL_TABLET | Freq: Every day | ORAL | 1 refills | Status: DC

## 2018-11-06 NOTE — Telephone Encounter (Signed)
Patient notified of her lab results from Dr. Baird Cancer kidney function has decreased and that the pt needed to increase her water intake, liver enzymes elevated, blood counts stable, thyroid normal, b12 low end of normal and needs weekly b12 injections for 3 weeks.  1st weekly injection scheduled.

## 2018-11-06 NOTE — Progress Notes (Signed)
Subjective:     Patient ID: [name] , female    DOB: [birth year] , 68 y.o.   MRN: [id number]   Chief Complaint  Patient presents with  . left breast pain  . Headache    HPI  She is here today for further evaluation of left breast pain. She is unable to state what triggers her symptoms. Described as a sharp, shooting pain. Denies skin changes and nipple discharge. Her last mammogram was March 2018.   Headache   This is a chronic problem. The current episode started more than 1 month ago. The problem occurs intermittently. The problem has been gradually worsening. The pain is located in the right unilateral region. The pain radiates to the face. The pain quality is similar to prior headaches. The quality of the pain is described as aching, sharp and stabbing. The pain is at a severity of 10/10. The pain is severe. Associated symptoms include scalp tenderness.  Drug / Alcohol Assessment  The patient's primary symptoms include agitation. This is a chronic problem. The current episode started more than 1 month ago. The problem has been gradually worsening since onset. Suspected agents include alcohol. Her past medical history is significant for a chronic illness.   She admits that her alcohol use has increased over the past several weeks/months. She reports having 3 drinks per night. She prefers vodka. She states her husband has asked her to cut back. She drinks several times per day. She is annoyed by her husband who criticizes her drinking, but also brings home liquor for her to drink. She denies needing alcohol as an eye-opener.   Of note, she had craniotomy performed July 2019 for medically refractory trigeminal neuralgia. She does not feel that the surgery was a success.  Afterwards, she developed a pseudomeningocele. And, she feels the pain has worsened since having the surgery.     Past Medical History:  Diagnosis Date  . Anxiety    takes Xanax prn  . Arthritis   .  Diverticulosis   . H/O blood transfusion reaction 2010   hives  . H/O migraine    last 15yr ago  . Headache(784.0)   . Heart murmur    takes Bystolic nightly  . History of blood clots 444yr  was on Lovenox injections and Coumadin(was only on that for short period of time)  . History of shingles [id number]  . Hx of seasonal allergies    takes Levocetirizine prn  . Hyperlipidemia    takes Crestor nightly  . Hypertension    takes Maxzide daily  . Insomnia    takes Ambien prn  . Joint pain   . Joint swelling   . Trigeminal neuralgia of right side of face 03/26/2016   V2 distribution     Family History  Problem Relation Age of Onset  . Heart attack Mother   . Alzheimer's disease Mother   . Diabetes Sister   . Hypertension Sister      Current Outpatient Medications:  .  cholecalciferol (VITAMIN D) 1000 UNITS tablet, Take 1,000 Units by mouth daily. , Disp: , Rfl:  .  nebivolol (BYSTOLIC) 5 MG tablet, Take 5 mg by mouth every evening. For HTN, Disp: , Rfl:  .  ondansetron (ZOFRAN) 4 MG tablet, Take 1 tablet (4 mg total) by mouth every 6 (six) hours., Disp: 12 tablet, Rfl: 0 .  rosuvastatin (CRESTOR) 10 MG tablet, Take 10 mg by mouth every evening. For hyperlipidemia, Disp: , Rfl:  .  triamterene-hydrochlorothiazide (DYAZIDE) 37.5-25 MG capsule, Take 1 capsule by mouth every morning., Disp: , Rfl:  .  zolpidem (AMBIEN) 5 MG tablet, Take 5 mg by mouth at bedtime as needed for sleep. , Disp: , Rfl:  .  thiamine (VITAMIN B-1) 100 MG tablet, Take 1 tablet (100 mg total) by mouth daily., Disp: 30 tablet, Rfl: 1   Allergies  Allergen Reactions  . Ibuprofen Hives and Swelling  . Penicillins Hives and Swelling    Has patient had a PCN reaction causing immediate rash, facial/tongue/throat swelling, SOB or lightheadedness with hypotension: Yes Has patient had a PCN reaction causing severe rash involving mucus membranes or skin necrosis: No Has patient had a PCN reaction that required  hospitalization: No Has patient had a PCN reaction occurring within the last 10 years: No If all of the above answers are "NO", then may proceed with Cephalosporin use.      Review of Systems  Constitutional: Negative.   Respiratory: Negative.   Cardiovascular: Negative.   Gastrointestinal: Negative.   Genitourinary: Negative.   Neurological: Positive for headaches.  Psychiatric/Behavioral: Positive for agitation.     Today's Vitals   10/29/18 1147  BP: 108/74  Pulse: 94  Temp: 97.9 F (36.6 C)  TempSrc: Oral  Weight: 147 lb (66.7 kg)  Height: 4' 11.5" (1.511 m)  PainSc: 0-No pain   Body mass index is 29.19 kg/m.   Objective:  Physical Exam Vitals signs and nursing note reviewed.  Constitutional:      Appearance: She is well-developed.  HENT:     Head: Normocephalic.  Eyes:     Extraocular Movements: Extraocular movements intact.     Pupils: Pupils are equal, round, and reactive to light.  Cardiovascular:     Heart sounds: Normal heart sounds.  Pulmonary:     Effort: Pulmonary effort is normal.     Breath sounds: Normal breath sounds.     Comments: Healed surgical scars b/l breasts Chest:     Breasts:        Right: Normal.        Left: Normal. No swelling, bleeding, inverted nipple, mass or nipple discharge.  Skin:    General: Skin is warm.     Capillary Refill: Capillary refill takes less than 2 seconds.  Neurological:     Mental Status: She is alert.         Assessment And Plan:     1. Mastodynia of left breast  I will refer her for an ultrasound of the breast. I will also schedule diagnostic mammo if needed. She reports last mammogram was in March 2019.   - [place] Unlisted Procedure Breast; Future  2. Other complicated headache syndrome  I will refer her to N/S for further evaluation. She is interested in second opinion from a different provider.   - Ambulatory referral to Neurosurgery  3. Trigeminal neuralgia of right side of face  Chronic.   Please see #2.   - Ambulatory referral to Neurosurgery  4. Abnormal weight loss  I will check labs as listed below.  I suspect she is eating less due to increased alcohol use. I will make further recommendations once her labs are available for review.   - CBC with Diff - TSH - CMP14+EGFR  5. Chronic pain syndrome  I will consider referral to [facility] for their chronic pain program. I will address again after neurosurgical evaluation.  6. Alcohol use  I will check a thiamine level today. I will also  send rx thiamine 174m once daily. She does not wish to start a detox program at this time. She is asked to cut back to no more than 2 drinks per day. She was advised of increased risk of cancers, cardiomyopathy and neurological disorders associated with increased alcohol use. [place] [facility] [place]  - Vitamin B1  7. Drug therapy  - Vitamin B12        RMaximino Greenland MD

## 2018-11-07 LAB — CMP14+EGFR
ALK PHOS: 94 IU/L (ref 39–117)
ALT: 20 IU/L (ref 0–32)
AST: 51 IU/L — AB (ref 0–40)
Albumin/Globulin Ratio: 2 (ref 1.2–2.2)
Albumin: 4.5 g/dL (ref 3.6–4.8)
BUN/Creatinine Ratio: 18 (ref 12–28)
BUN: 21 mg/dL (ref 8–27)
Bilirubin Total: 0.4 mg/dL (ref 0.0–1.2)
CALCIUM: 9.7 mg/dL (ref 8.7–10.3)
CO2: 24 mmol/L (ref 20–29)
CREATININE: 1.2 mg/dL — AB (ref 0.57–1.00)
Chloride: 94 mmol/L — ABNORMAL LOW (ref 96–106)
GFR calc Af Amer: 54 mL/min/{1.73_m2} — ABNORMAL LOW (ref >59)
GFR calc non Af Amer: 47 mL/min/{1.73_m2} — ABNORMAL LOW (ref >59)
GLOBULIN, TOTAL: 2.3 g/dL (ref 1.5–4.5)
GLUCOSE: 107 mg/dL — AB (ref 65–99)
Potassium: 3.9 mmol/L (ref 3.5–5.2)
SODIUM: 142 mmol/L (ref 134–144)
Total Protein: 6.8 g/dL (ref 6.0–8.5)

## 2018-11-07 LAB — CBC WITH DIFFERENTIAL/PLATELET
BASOS: 0 %
Basophils Absolute: 0 10*3/uL (ref 0.0–0.2)
EOS (ABSOLUTE): 0.1 10*3/uL (ref 0.0–0.4)
Eos: 2 %
HEMATOCRIT: 42.1 % (ref 34.0–46.6)
Hemoglobin: 14.1 g/dL (ref 11.1–15.9)
IMMATURE GRANS (ABS): 0 10*3/uL (ref 0.0–0.1)
Immature Granulocytes: 0 %
LYMPHS: 31 %
Lymphocytes Absolute: 2.2 10*3/uL (ref 0.7–3.1)
MCH: 31.4 pg (ref 26.6–33.0)
MCHC: 33.5 g/dL (ref 31.5–35.7)
MCV: 94 fL (ref 79–97)
Monocytes Absolute: 0.9 10*3/uL (ref 0.1–0.9)
Monocytes: 12 %
NEUTROS ABS: 3.8 10*3/uL (ref 1.4–7.0)
NRBC: 1 % — ABNORMAL HIGH (ref 0–0)
Neutrophils: 55 %
Platelets: 253 10*3/uL (ref 150–450)
RBC: 4.49 x10E6/uL (ref 3.77–5.28)
RDW: 16.3 % — AB (ref 11.7–15.4)
WBC: 7 10*3/uL (ref 3.4–10.8)

## 2018-11-07 LAB — VITAMIN B1: THIAMINE: 123.8 nmol/L (ref 66.5–200.0)

## 2018-11-07 LAB — TSH: TSH: 1.81 u[IU]/mL (ref 0.450–4.500)

## 2018-11-07 LAB — VITAMIN B12: VITAMIN B 12: 255 pg/mL (ref 232–1245)

## 2018-11-10 ENCOUNTER — Ambulatory Visit: Payer: Self-pay | Admitting: Internal Medicine

## 2018-11-11 ENCOUNTER — Encounter: Payer: Self-pay | Admitting: Internal Medicine

## 2018-11-11 ENCOUNTER — Ambulatory Visit (INDEPENDENT_AMBULATORY_CARE_PROVIDER_SITE_OTHER): Payer: Medicare Other | Admitting: Internal Medicine

## 2018-11-11 VITALS — BP 116/72 | HR 90 | Temp 97.8°F | Ht 59.5 in | Wt 145.2 lb

## 2018-11-11 DIAGNOSIS — F1020 Alcohol dependence, uncomplicated: Secondary | ICD-10-CM

## 2018-11-11 DIAGNOSIS — G500 Trigeminal neuralgia: Secondary | ICD-10-CM

## 2018-11-11 DIAGNOSIS — D519 Vitamin B12 deficiency anemia, unspecified: Secondary | ICD-10-CM | POA: Diagnosis not present

## 2018-11-11 MED ORDER — CYANOCOBALAMIN 1000 MCG/ML IJ SOLN
1000.0000 ug | Freq: Once | INTRAMUSCULAR | Status: AC
  Administered 2018-11-11: 1000 ug via INTRAMUSCULAR

## 2018-11-11 NOTE — Patient Instructions (Signed)
Wernicke-Korsakoff Syndrome  Wernicke-Korsakoff syndrome (WKS) is a brain disorder that impairs a person's mental health and causes memory loss and unstable emotions (psychosis). The term actually refers to two conditions that often occur together. Wernicke encephalopathy happens first. If the disease gets worse, Wernicke encephalopathy is followed by Korsakoff syndrome (Korsakoff psychosis). It is caused by not getting enough vitamin B1 (thiamine). This condition can permanently damage the areas of the brain that are responsible for memory. Wernicke encephalopathy is a medical emergency. What are the causes? This condition is caused by a lack of vitamin B1 (thiamine). This deficiency can result from:  Alcoholism. This is the most common cause.  Poor nutrition.  Certain medical conditions.  Bariatric or gastrointestinal surgery. What increases the risk? The following factors may make you more likely to develop this condition:  Having severe alcoholism for 5 years or longer.  Poor nutrition.  AIDS (acquired immunodeficiency syndrome).  Long-term (chronic) infections.  Failing to eat properly because of fear of weight gain (anorexia nervosa).  Kidney dialysis.  Advanced cancer. What are the signs or symptoms? Symptoms of this condition may vary based on the stage of the disease. Symptoms of Wernicke encephalopathy include:  Uncoordinated muscles.  Abnormal eye movements.  Confusion.  Loss of other mental abilities.  Double vision.  Drooping eyelids.  Symptoms of alcohol withdrawal. As the disease gets worse, symptoms of Korsakoff syndrome may develop. These include:  Severe memory loss.  Inability to form new memories.  Seeing, hearing, tasting, smelling, or feeling things that are not real (hallucinations).  Tendency to make up stories. How is this diagnosed? This condition may be diagnosed based on:  Your symptoms and medical history. Your health care  provider may suspect this condition if you abuse alcohol.  A physical exam.  Blood tests to check your thiamine level and to look for other signs of malnutrition.  Tests to check for memory loss.  Imaging studies to look for changes in the brain and other body parts. These may include MRI and CT scans. How is this treated? This condition may be treated with:  Thiamine replacement therapy. You may be given thiamine through an IV or by mouth.  Treatment for alcoholism.  Treatment for other nutritional problems.  Medicine and mental health counseling for chronic dementia if appropriate. Treatment for this condition needs to start early. If the condition is diagnosed and treated in the early stages, it can be reversed. If the condition is left untreated, it can cause permanent brain damage. Follow these instructions at home:  Take over-the-counter and prescription medicines only as told by your health care provider.  Do not drink alcohol. Get treatment for alcoholism if needed.  Eat a nutritious, well-balanced diet. ? Be sure to include plenty of vegetables, fruits, low-fat dairy products, and lean protein. ? Limit foods that are high in solid fats, added sugars, or salt.  Get regular exercise. ? Most adults should exercise for at least 150 minutes each week. The exercise should increase your heart rate and make you sweat (moderate-intensity exercise). ? Most adults should also do strengthening exercises at least twice a week. This is in addition to the moderate-intensity exercise.  Get caregiver support if needed.  Keep all follow-up visits as told by your health care provider. This is important. Visits may include sessions for mental health counseling, or meeting with a nutrition specialist (dietitian). Contact a health care provider if you have:  Confusion or memory problems.  Severe alcoholism. Get help right  away if:  Your symptoms become severe.  You have serious  thoughts about hurting yourself or others. If you ever feel like you may hurt yourself or others, or have thoughts about taking your own life, get help right away. You can go to your nearest emergency department or call:  Your local emergency services (911 in the [place]  A suicide crisis helpline, such as the [facility] at (920)737-2684. This is open 24 hours a day. Summary  Wernicke-Korsakoff syndrome (WKS) is a brain disorder that impairs a person's mental health and causes memory loss and unstable emotions (psychosis).  This condition is caused by a lack of vitamin B1 (thiamine). That may be the result of alcoholism, poor eating habits, or certain medical conditions.  Treatment for WKS needs to start early. If the condition is diagnosed and treated in its early stages, it can be reversed. If the condition is left untreated, it can cause permanent brain damage.  Keep all follow-up visits as told by your health care provider. This information is not intended to replace advice given to you by your health care provider. Make sure you discuss any questions you have with your health care provider. Document Released: 09/28/2002 Document Revised: 08/29/2017 Document Reviewed: 08/29/2017 Elsevier Interactive Patient Education  2019 [employer].

## 2018-11-18 ENCOUNTER — Other Ambulatory Visit: Payer: Self-pay | Admitting: Internal Medicine

## 2018-11-18 DIAGNOSIS — N644 Mastodynia: Secondary | ICD-10-CM

## 2018-11-22 ENCOUNTER — Encounter: Payer: Self-pay | Admitting: Internal Medicine

## 2018-11-22 NOTE — Progress Notes (Signed)
Subjective:     Patient ID: [name] , female    DOB: [birth year] , 68 y.o.   MRN: [id number]   Chief Complaint  Patient presents with  . vitamin b12 injection    HPI  She is here today for a vitamin B12 injection. She was found to have low vit b12 at her last visit. She denies having any problems since her last visit.    Past Medical History:  Diagnosis Date  . Anxiety    takes Xanax prn  . Arthritis   . Diverticulosis   . H/O blood transfusion reaction 2010   hives  . H/O migraine    last 67yrs ago  . Headache(784.0)   . Heart murmur    takes Bystolic nightly  . History of blood clots 27yrs   was on Lovenox injections and Coumadin(was only on that for short period of time)  . History of shingles 3-61yrs ago  . Hx of seasonal allergies    takes Levocetirizine prn  . Hyperlipidemia    takes Crestor nightly  . Hypertension    takes Maxzide daily  . Insomnia    takes Ambien prn  . Joint pain   . Joint swelling   . Trigeminal neuralgia of right side of face 03/26/2016   V2 distribution     Family History  Problem Relation Age of Onset  . Heart attack Mother   . Alzheimer's disease Mother   . Diabetes Sister   . Hypertension Sister      Current Outpatient Medications:  .  cholecalciferol (VITAMIN D) 1000 UNITS tablet, Take 1,000 Units by mouth daily. , Disp: , Rfl:  .  nebivolol (BYSTOLIC) 5 MG tablet, Take 5 mg by mouth every evening. For HTN, Disp: , Rfl:  .  ondansetron (ZOFRAN) 4 MG tablet, Take 1 tablet (4 mg total) by mouth every 6 (six) hours., Disp: 12 tablet, Rfl: 0 .  rosuvastatin (CRESTOR) 10 MG tablet, Take 10 mg by mouth every evening. For hyperlipidemia, Disp: , Rfl:  .  triamterene-hydrochlorothiazide (DYAZIDE) 37.5-25 MG capsule, Take 1 capsule by mouth every morning., Disp: , Rfl:  .  zolpidem (AMBIEN) 5 MG tablet, Take 5 mg by mouth at bedtime as needed for sleep. , Disp: , Rfl:  .  thiamine (VITAMIN B-1) 100 MG tablet, Take 1 tablet (100 mg  total) by mouth daily. (Patient not taking: Reported on 11/11/2018), Disp: 30 tablet, Rfl: 1   Allergies  Allergen Reactions  . Ibuprofen Hives and Swelling  . Penicillins Hives and Swelling    Has patient had a PCN reaction causing immediate rash, facial/tongue/throat swelling, SOB or lightheadedness with hypotension: Yes Has patient had a PCN reaction causing severe rash involving mucus membranes or skin necrosis: No Has patient had a PCN reaction that required hospitalization: No Has patient had a PCN reaction occurring within the last 10 years: No If all of the above answers are "NO", then may proceed with Cephalosporin use.      Review of Systems  Constitutional: Negative.   Respiratory: Negative.   Cardiovascular: Negative.   Gastrointestinal: Negative.   Neurological: Negative.   Psychiatric/Behavioral: Negative.      Today's Vitals   11/11/18 1438  BP: 116/72  Pulse: 90  Temp: 97.8 F (36.6 C)  TempSrc: Oral  Weight: 145 lb 3.2 oz (65.9 kg)  Height: 4' 11.5" (1.511 m)   Body mass index is 28.84 kg/m.   Objective:  Physical Exam Vitals signs and nursing  note reviewed.  Constitutional:      Appearance: Normal appearance.  HENT:     Head: Normocephalic and atraumatic.  Cardiovascular:     Rate and Rhythm: Normal rate and regular rhythm.     Heart sounds: Normal heart sounds.  Pulmonary:     Effort: Pulmonary effort is normal.     Breath sounds: Normal breath sounds.  Skin:    General: Skin is warm.  Neurological:     General: No focal deficit present.     Mental Status: She is alert.  Psychiatric:        Mood and Affect: Mood normal.         Assessment And Plan:     1. Anemia due to vitamin B12 deficiency, unspecified B12 deficiency type  She was given vit b12 IM x1. She will rto in one week, if she is still in town.   - cyanocobalamin ((VITAMIN B-12)) injection 1,000 mcg  2. Trigeminal neuralgia of right side of face  Chronic. Unfortunately,  she did not get any relief after her surgery. This likey contributes to the following diagnosis.   3. Alcohol use disorder, moderate, dependence ([place])  She is in active phase of change. She is willing to go to residential detox program at this time. She was given several references for clinics in both [place] and [place]. She does not wish to attend anything in [place]. She will let me know which facility her insurance will cover. She is encouraged to take thiamine 100mg  daily. Greater than 50% of face to face time was spent in counseling and coordination of care.   [name], MD

## 2018-11-26 ENCOUNTER — Ambulatory Visit: Payer: Medicare Other

## 2018-11-26 ENCOUNTER — Telehealth: Payer: Self-pay

## 2018-11-26 ENCOUNTER — Ambulatory Visit: Payer: Medicare Other | Admitting: Internal Medicine

## 2018-11-26 NOTE — Telephone Encounter (Signed)
Attempted to call in order to reschedule missed appointment.  [name] not available at this time,but will be updated that I called.  Stated that when the automated call called they responded to cancel the appointment.

## 2018-11-29 ENCOUNTER — Other Ambulatory Visit: Payer: Self-pay | Admitting: Internal Medicine

## 2018-12-09 ENCOUNTER — Ambulatory Visit: Payer: Medicare Other | Admitting: Adult Health

## 2018-12-15 DIAGNOSIS — Z9889 Other specified postprocedural states: Secondary | ICD-10-CM | POA: Diagnosis not present

## 2018-12-15 DIAGNOSIS — G500 Trigeminal neuralgia: Secondary | ICD-10-CM | POA: Diagnosis not present

## 2018-12-16 ENCOUNTER — Other Ambulatory Visit: Payer: Self-pay | Admitting: Internal Medicine

## 2018-12-17 ENCOUNTER — Ambulatory Visit (INDEPENDENT_AMBULATORY_CARE_PROVIDER_SITE_OTHER): Payer: Medicare Other | Admitting: Nurse Practitioner

## 2018-12-17 ENCOUNTER — Ambulatory Visit (INDEPENDENT_AMBULATORY_CARE_PROVIDER_SITE_OTHER): Payer: Medicare Other

## 2018-12-17 ENCOUNTER — Other Ambulatory Visit: Payer: Self-pay

## 2018-12-17 ENCOUNTER — Encounter: Payer: Self-pay | Admitting: Nurse Practitioner

## 2018-12-17 VITALS — BP 120/62 | HR 68 | Temp 98.1°F | Ht 59.5 in | Wt 151.6 lb

## 2018-12-17 VITALS — BP 120/62 | HR 68 | Temp 98.1°F | Ht 59.5 in | Wt 151.4 lb

## 2018-12-17 DIAGNOSIS — I10 Essential (primary) hypertension: Secondary | ICD-10-CM

## 2018-12-17 DIAGNOSIS — Z0000 Encounter for general adult medical examination without abnormal findings: Secondary | ICD-10-CM

## 2018-12-17 DIAGNOSIS — R7303 Prediabetes: Secondary | ICD-10-CM | POA: Diagnosis not present

## 2018-12-17 DIAGNOSIS — E782 Mixed hyperlipidemia: Secondary | ICD-10-CM | POA: Diagnosis not present

## 2018-12-17 DIAGNOSIS — M79674 Pain in right toe(s): Secondary | ICD-10-CM

## 2018-12-17 LAB — POCT URINALYSIS DIPSTICK
BILIRUBIN UA: NEGATIVE
Blood, UA: NEGATIVE
GLUCOSE UA: NEGATIVE
KETONES UA: NEGATIVE
LEUKOCYTES UA: NEGATIVE
Nitrite, UA: NEGATIVE
Protein, UA: NEGATIVE
SPEC GRAV UA: 1.025 (ref 1.010–1.025)
Urobilinogen, UA: 0.2 E.U./dL
pH, UA: 6 (ref 5.0–8.0)

## 2018-12-17 MED ORDER — TRIAMCINOLONE ACETONIDE 40 MG/ML IJ SUSP
60.0000 mg | Freq: Once | INTRAMUSCULAR | Status: AC
  Administered 2018-12-17: 60 mg via INTRAMUSCULAR

## 2018-12-17 NOTE — Progress Notes (Signed)
Subjective:     Patient ID: [name] , female    DOB: 11-04-1950 , 68 y.o.   MRN: [id number]   Chief Complaint  Patient presents with  . Hypertension   The patient states she uses status post hysterectomy for birth control. Last LMP was No LMP recorded. Patient has had a hysterectomy.. Negative for Dysmenorrhea and Negative for Menorrhagia Mammogram last done 2019 Negative for: breast discharge, breast lump(s), breast pain and breast self exam.  Pertinent negatives include abnormal bleeding (hematology), anxiety, decreased libido, depression, difficulty falling sleep, dyspareunia, history of infertility, nocturia, sexual dysfunction, sleep disturbances, urinary incontinence, urinary urgency, vaginal discharge and vaginal itching. Diet regular.The patient states her exercise level is  3 days a week.       The patient's tobacco use is:  Social History   Tobacco Use  Smoking Status Passive Smoke Exposure - Never Smoker  Smokeless Tobacco Never Used   She has been exposed to passive smoke. The patient's alcohol use is:  Social History   Substance and Sexual Activity  Alcohol Use Not Currently   Comment: occasionally   Additional information: Last pap 2008   HPI  Here for HM   Right foot pain - 1 day history of pain.  Painful when got out of bed.  Positive swelling.    Hypertension  This is a chronic problem. The current episode started more than 1 year ago. The problem is controlled. Pertinent negatives include no anxiety. There are no associated agents to hypertension. Risk factors for coronary artery disease include sedentary lifestyle. Past treatments include angiotensin blockers. The current treatment provides no improvement. There is no history of angina. There is no history of chronic renal disease.     Past Medical History:  Diagnosis Date  . Anxiety    takes Xanax prn  . Arthritis   . Diverticulosis   . H/O blood transfusion reaction 2010   hives  . H/O  migraine    last 63yrs ago  . Headache(784.0)   . Heart murmur    takes Bystolic nightly  . History of blood clots 44yrs   was on Lovenox injections and Coumadin(was only on that for short period of time)  . History of shingles 3-69yrs ago  . Hx of seasonal allergies    takes Levocetirizine prn  . Hyperlipidemia    takes Crestor nightly  . Hypertension    takes Maxzide daily  . Insomnia    takes Ambien prn  . Joint pain   . Joint swelling   . Trigeminal neuralgia of right side of face 03/26/2016   V2 distribution     Family History  Problem Relation Age of Onset  . Heart attack Mother   . Alzheimer's disease Mother   . Diabetes Sister   . Hypertension Sister      Current Outpatient Medications:  .  BYSTOLIC 5 MG tablet, TAKE 1 TABLET DAILY, Disp: 90 tablet, Rfl: 2 .  cholecalciferol (VITAMIN D) 1000 UNITS tablet, Take 1,000 Units by mouth daily. , Disp: , Rfl:  .  ondansetron (ZOFRAN) 4 MG tablet, Take 1 tablet (4 mg total) by mouth every 6 (six) hours., Disp: 12 tablet, Rfl: 0 .  rosuvastatin (CRESTOR) 10 MG tablet, TAKE 1 TABLET DAILY, Disp: 90 tablet, Rfl: 1 .  triamterene-hydrochlorothiazide (DYAZIDE) 37.5-25 MG capsule, Take 1 capsule by mouth every morning., Disp: , Rfl:  .  zolpidem (AMBIEN) 5 MG tablet, Take 5 mg by mouth at bedtime as needed  for sleep. , Disp: , Rfl:  .  thiamine (VITAMIN B-1) 100 MG tablet, Take 1 tablet (100 mg total) by mouth daily. (Patient not taking: Reported on 11/11/2018), Disp: 30 tablet, Rfl: 1   Allergies  Allergen Reactions  . Ibuprofen Hives and Swelling  . Penicillins Hives and Swelling    Has patient had a PCN reaction causing immediate rash, facial/tongue/throat swelling, SOB or lightheadedness with hypotension: Yes Has patient had a PCN reaction causing severe rash involving mucus membranes or skin necrosis: No Has patient had a PCN reaction that required hospitalization: No Has patient had a PCN reaction occurring within the last 10  years: No If all of the above answers are "NO", then may proceed with Cephalosporin use.      Review of Systems  Constitutional: Negative.   HENT: Negative.   Eyes: Negative.   Respiratory: Negative.   Cardiovascular: Negative.   Endocrine: Negative.   Genitourinary: Negative.   Musculoskeletal: Negative.   Skin: Negative.   Allergic/Immunologic: Negative.   Neurological: Negative.   Hematological: Negative.   Psychiatric/Behavioral: Negative.      Today's Vitals   12/17/18 1147  BP: 120/62  Pulse: 68  Temp: 98.1 F (36.7 C)  TempSrc: Oral  SpO2: 96%  Weight: 151 lb 6.4 oz (68.7 kg)  Height: 4' 11.5" (1.511 m)   Body mass index is 30.07 kg/m.   Objective:  Physical Exam Vitals signs reviewed.  Constitutional:      Appearance: Normal appearance. She is well-developed.  HENT:     Head: Normocephalic and atraumatic.     Right Ear: Hearing, tympanic membrane, ear canal and external ear normal.     Left Ear: Hearing, tympanic membrane, ear canal and external ear normal.     Nose: Nose normal.     Mouth/Throat:     Mouth: Mucous membranes are moist.  Eyes:     General: Lids are normal.     Conjunctiva/sclera: Conjunctivae normal.     Pupils: Pupils are equal, round, and reactive to light.     Funduscopic exam:    Right eye: No papilledema.        Left eye: No papilledema.  Neck:     Musculoskeletal: Full passive range of motion without pain, normal range of motion and neck supple.     Thyroid: No thyroid mass.     Vascular: No carotid bruit.  Cardiovascular:     Rate and Rhythm: Normal rate and regular rhythm.     Pulses: Normal pulses.     Heart sounds: Normal heart sounds. No murmur.  Pulmonary:     Effort: Pulmonary effort is normal.     Breath sounds: Normal breath sounds.  Abdominal:     General: Abdomen is flat. Bowel sounds are normal.     Palpations: Abdomen is soft.  Musculoskeletal: Normal range of motion.        General: No swelling.      Right lower leg: No edema.     Left lower leg: No edema.  Skin:    General: Skin is warm and dry.     Capillary Refill: Capillary refill takes less than 2 seconds.     Comments: Healing surgical scar to right lateral head  Neurological:     General: No focal deficit present.     Mental Status: She is alert and oriented to person, place, and time.     Cranial Nerves: No cranial nerve deficit.     Sensory: No sensory  deficit.  Psychiatric:        Mood and Affect: Mood normal.        Behavior: Behavior normal.        Thought Content: Thought content normal.        Judgment: Judgment normal.         Assessment And Plan:     1. Health maintenance examination . Behavior modifications discussed and diet history reviewed.   . Pt will continue to exercise regularly and modify diet with low GI, plant based foods and decrease intake of processed foods.  . Recommend intake of daily multivitamin, Vitamin D, and calcium.  . Recommend mammogram and colonoscopy for preventive screenings, as well as recommend immunizations that include influenza, TDAP, and Shingles  2. Essential hypertension . B/P is well controlled.  . CMP ordered to check renal function.  . The importance of regular exercise and dietary modification was stressed to the patient.  - CBC no Diff - CMP14 + Anion Gap  3. Mixed hyperlipidemia  Chronic, controlled  Continue with current medications  No current muscle weakness or cramping  4. Prediabetes  Chronic, controlled  No current medications  Encouraged to limit intake of sugary foods and drinks  Encouraged to increase physical activity to 150 minutes per week as tolerated - Hemoglobin A1c  5. Pain of right great toe  Tender right great toe at base of metatarsal  Will treat with kenalog 60 mg  She is advised to monitor her blood sugars - triamcinolone acetonide (KENALOG-40) injection 60 mg - Uric acid    Minette Brine, FNP

## 2018-12-17 NOTE — Patient Instructions (Signed)
[name] , Thank you for taking time to come for your Medicare Wellness Visit. I appreciate your ongoing commitment to your health goals. Please review the following plan we discussed and let me know if I can assist you in the future.   Screening recommendations/referrals: Colonoscopy: 03/2017 Mammogram: 12/2017 Bone Density: 09/2015 Recommended yearly ophthalmology/optometry visit for glaucoma screening and checkup Recommended yearly dental visit for hygiene and checkup  Vaccinations: Influenza vaccine: 07/2018 Pneumococcal vaccine: 08/2013 Tdap vaccine: 08/2017 Shingles vaccine: discussed  Advanced directives: Advance directive discussed with you today. I have provided a copy for you to complete at home and have notarized. Once this is complete please bring a copy in to our office so we can scan it into your chart.   Conditions/risks identified: right foot pain  Next appointment: 12/24/2019 at 11:00   Preventive Care 65 Years and Older, Female Preventive care refers to lifestyle choices and visits with your health care provider that can promote health and wellness. What does preventive care include?  A yearly physical exam. This is also called an annual well check.  Dental exams once or twice a year.  Routine eye exams. Ask your health care provider how often you should have your eyes checked.  Personal lifestyle choices, including:  Daily care of your teeth and gums.  Regular physical activity.  Eating a healthy diet.  Avoiding tobacco and drug use.  Limiting alcohol use.  Practicing safe sex.  Taking low-dose aspirin every day.  Taking vitamin and mineral supplements as recommended by your health care provider. What happens during an annual well check? The services and screenings done by your health care provider during your annual well check will depend on your age, overall health, lifestyle risk factors, and family history of disease. Counseling  Your health  care provider may ask you questions about your:  Alcohol use.  Tobacco use.  Drug use.  Emotional well-being.  Home and relationship well-being.  Sexual activity.  Eating habits.  History of falls.  Memory and ability to understand (cognition).  Work and work Statistician.  Reproductive health. Screening  You may have the following tests or measurements:  Height, weight, and BMI.  Blood pressure.  Lipid and cholesterol levels. These may be checked every 5 years, or more frequently if you are over 70 years old.  Skin check.  Lung cancer screening. You may have this screening every year starting at age 74 if you have a 30-pack-year history of smoking and currently smoke or have quit within the past 15 years.  Fecal occult blood test (FOBT) of the stool. You may have this test every year starting at age 6.  Flexible sigmoidoscopy or colonoscopy. You may have a sigmoidoscopy every 5 years or a colonoscopy every 10 years starting at age 10.  Hepatitis C blood test.  Hepatitis B blood test.  Sexually transmitted disease (STD) testing.  Diabetes screening. This is done by checking your blood sugar (glucose) after you have not eaten for a while (fasting). You may have this done every 1-3 years.  Bone density scan. This is done to screen for osteoporosis. You may have this done starting at age 89.  Mammogram. This may be done every 1-2 years. Talk to your health care provider about how often you should have regular mammograms. Talk with your health care provider about your test results, treatment options, and if necessary, the need for more tests. Vaccines  Your health care provider may recommend certain vaccines, such as:  Influenza vaccine. This is recommended every year.  Tetanus, diphtheria, and acellular pertussis (Tdap, Td) vaccine. You may need a Td booster every 10 years.  Zoster vaccine. You may need this after age 19.  Pneumococcal 13-valent conjugate  (PCV13) vaccine. One dose is recommended after age 33.  Pneumococcal polysaccharide (PPSV23) vaccine. One dose is recommended after age 84. Talk to your health care provider about which screenings and vaccines you need and how often you need them. This information is not intended to replace advice given to you by your health care provider. Make sure you discuss any questions you have with your health care provider. Document Released: 11/04/2015 Document Revised: 06/27/2016 Document Reviewed: 08/09/2015 Elsevier Interactive Patient Education  2017 [employer] Prevention in the Home Falls can cause injuries. They can happen to people of all ages. There are many things you can do to make your home safe and to help prevent falls. What can I do on the outside of my home?  Regularly fix the edges of walkways and driveways and fix any cracks.  Remove anything that might make you trip as you walk through a door, such as a raised step or threshold.  Trim any bushes or trees on the path to your home.  Use bright outdoor lighting.  Clear any walking paths of anything that might make someone trip, such as rocks or tools.  Regularly check to see if handrails are loose or broken. Make sure that both sides of any steps have handrails.  Any raised decks and porches should have guardrails on the edges.  Have any leaves, snow, or ice cleared regularly.  Use sand or salt on walking paths during winter.  Clean up any spills in your garage right away. This includes oil or grease spills. What can I do in the bathroom?  Use night lights.  Install grab bars by the toilet and in the tub and shower. Do not use towel bars as grab bars.  Use non-skid mats or decals in the tub or shower.  If you need to sit down in the shower, use a plastic, non-slip stool.  Keep the floor dry. Clean up any water that spills on the floor as soon as it happens.  Remove soap buildup in the tub or shower  regularly.  Attach bath mats securely with double-sided non-slip rug tape.  Do not have throw rugs and other things on the floor that can make you trip. What can I do in the bedroom?  Use night lights.  Make sure that you have a light by your bed that is easy to reach.  Do not use any sheets or blankets that are too big for your bed. They should not hang down onto the floor.  Have a firm chair that has side arms. You can use this for support while you get dressed.  Do not have throw rugs and other things on the floor that can make you trip. What can I do in the kitchen?  Clean up any spills right away.  Avoid walking on wet floors.  Keep items that you use a lot in easy-to-reach places.  If you need to reach something above you, use a strong step stool that has a grab bar.  Keep electrical cords out of the way.  Do not use floor polish or wax that makes floors slippery. If you must use wax, use non-skid floor wax.  Do not have throw rugs and other things on the floor that  can make you trip. What can I do with my stairs?  Do not leave any items on the stairs.  Make sure that there are handrails on both sides of the stairs and use them. Fix handrails that are broken or loose. Make sure that handrails are as long as the stairways.  Check any carpeting to make sure that it is firmly attached to the stairs. Fix any carpet that is loose or worn.  Avoid having throw rugs at the top or bottom of the stairs. If you do have throw rugs, attach them to the floor with carpet tape.  Make sure that you have a light switch at the top of the stairs and the bottom of the stairs. If you do not have them, ask someone to add them for you. What else can I do to help prevent falls?  Wear shoes that:  Do not have high heels.  Have rubber bottoms.  Are comfortable and fit you well.  Are closed at the toe. Do not wear sandals.  If you use a stepladder:  Make sure that it is fully  opened. Do not climb a closed stepladder.  Make sure that both sides of the stepladder are locked into place.  Ask someone to hold it for you, if possible.  Clearly mark and make sure that you can see:  Any grab bars or handrails.  First and last steps.  Where the edge of each step is.  Use tools that help you move around (mobility aids) if they are needed. These include:  Canes.  Walkers.  Scooters.  Crutches.  Turn on the lights when you go into a dark area. Replace any light bulbs as soon as they burn out.  Set up your furniture so you have a clear path. Avoid moving your furniture around.  If any of your floors are uneven, fix them.  If there are any pets around you, be aware of where they are.  Review your medicines with your doctor. Some medicines can make you feel dizzy. This can increase your chance of falling. Ask your doctor what other things that you can do to help prevent falls. This information is not intended to replace advice given to you by your health care provider. Make sure you discuss any questions you have with your health care provider. Document Released: 08/04/2009 Document Revised: 03/15/2016 Document Reviewed: 11/12/2014 Elsevier Interactive Patient Education  2017 [employer].

## 2018-12-17 NOTE — Patient Instructions (Signed)

## 2018-12-17 NOTE — Addendum Note (Signed)
Addended by: Kellie Simmering on: 12/17/2018 03:54 PM   Modules accepted: Orders

## 2018-12-17 NOTE — Progress Notes (Signed)
Subjective:   [name] is a 68 y.o. female who presents for Medicare Annual (Subsequent) preventive examination.  Review of Systems:  n/a Cardiac Risk Factors include: advanced age (>89men, >65 women);dyslipidemia;obesity (BMI >30kg/m2)     Objective:     Vitals: BP 120/62 (Patient Position: Sitting)   Pulse 68   Temp 98.1 F (36.7 C) (Oral)   Ht 4' 11.5" (1.511 m)   Wt 151 lb 9.6 oz (68.8 kg)   BMI 30.11 kg/m   Body mass index is 30.11 kg/m.  Advanced Directives 12/17/2018 09/17/2018 07/25/2018 05/10/2018 08/23/2017 08/23/2017 07/16/2017  Does Patient Have a Medical Advance Directive? No No No No No No No  Would patient like information on creating a medical advance directive? Yes (MAU/Ambulatory/Procedural Areas - Information given) - - No - Patient declined No - Patient declined - No - Patient declined  Pre-existing out of facility DNR order (yellow form or pink MOST form) - - - - - - -    Tobacco Social History   Tobacco Use  Smoking Status Passive Smoke Exposure - Never Smoker  Smokeless Tobacco Never Used     Counseling given: Not Answered   Clinical Intake:  Pre-visit preparation completed: Yes  Pain : 0-10 Pain Score: 6  Pain Type: Chronic pain Pain Location: Foot(swollen aand red) Pain Orientation: Right Pain Radiating Towards: none Pain Descriptors / Indicators: Aching, Burning Pain Onset: In the past 7 days Pain Frequency: Constant Pain Relieving Factors: has not tried anything Effect of Pain on Daily Activities: slowing down with walking and driving  Pain Relieving Factors: has not tried anything  Nutritional Status: BMI > 30  Obese Nutritional Risks: None Diabetes: No  How often do you need to have someone help you when you read instructions, pamphlets, or other written materials from your doctor or pharmacy?: 1 - Never What is the last grade level you completed in school?: some college  Interpreter Needed?: No  Information entered by  :: NAllen LPN  Past Medical History:  Diagnosis Date  . Anxiety    takes Xanax prn  . Arthritis   . Diverticulosis   . H/O blood transfusion reaction 2010   hives  . H/O migraine    last 9yrs ago  . Headache(784.0)   . Heart murmur    takes Bystolic nightly  . History of blood clots 12yrs   was on Lovenox injections and Coumadin(was only on that for short period of time)  . History of shingles 3-21yrs ago  . Hx of seasonal allergies    takes Levocetirizine prn  . Hyperlipidemia    takes Crestor nightly  . Hypertension    takes Maxzide daily  . Insomnia    takes Ambien prn  . Joint pain   . Joint swelling   . Trigeminal neuralgia of right side of face 03/26/2016   V2 distribution   Past Surgical History:  Procedure Laterality Date  . ABDOMINAL HYSTERECTOMY    . BREAST BIOPSY  05/03/2006   [place]  . BREAST REDUCTION SURGERY  1987  . CHOLECYSTECTOMY    . COLONOSCOPY    . ESOPHAGOGASTRODUODENOSCOPY    . EYE SURGERY Right    eye lash removed  . JOINT REPLACEMENT     bilateral hip rt in 2007 and lt in 2010  . NM MYOCAR PERF WALL MOTION  11/04/2008   Normal  . REDUCTION MAMMAPLASTY Bilateral 1988  . REDUCTION MAMMAPLASTY    . SHOULDER ARTHROSCOPY WITH DISTAL CLAVICLE RESECTION  Right 09/21/2015   Procedure: RIGHT SHOULDER ARTHROSCOPY WITH DISTAL CLAVICLE EXCISION DEBRIDE LABRAL TEAR ACROMIOPLASTY ;  Surgeon: Frederik Pear, MD;  Location: [place];  Service: Orthopedics;  Laterality: Right;  . TEE WITHOUT CARDIOVERSION N/A 04/12/2015   Procedure: TRANSESOPHAGEAL ECHOCARDIOGRAM (TEE)/BUBBLE STUDY;  Surgeon: Adrian Prows, MD;  Location: [place];  Service: Cardiovascular;  Laterality: N/A;  . TOTAL HIP REVISION  04/14/2012   Procedure: TOTAL HIP REVISION;  Surgeon: Kerin Salen, MD;  Location: [place];  Service: Orthopedics;  Laterality: Right;  right total hip revision  . TOTAL HIP REVISION Left 11/30/2013   Procedure: TOTAL HIP REVISION;  Surgeon: Kerin Salen, MD;   Location: [place];  Service: Orthopedics;  Laterality: Left;   Family History  Problem Relation Age of Onset  . Heart attack Mother   . Alzheimer's disease Mother   . Diabetes Sister   . Hypertension Sister    Social History   Socioeconomic History  . Marital status: Married    Spouse name: Not on file  . Number of children: 2  . Years of education: 27  . Highest education level: Not on file  Occupational History  . Occupation: Retired  Scientific laboratory technician  . Financial resource strain: Not hard at all  . Food insecurity:    Worry: Never true    Inability: Never true  . Transportation needs:    Medical: No    Non-medical: No  Tobacco Use  . Smoking status: Passive Smoke Exposure - Never Smoker  . Smokeless tobacco: Never Used  Substance and Sexual Activity  . Alcohol use: Not Currently    Comment: occasionally  . Drug use: No  . Sexual activity: Not Currently    Birth control/protection: Surgical  Lifestyle  . Physical activity:    Days per week: 3 days    Minutes per session: 30 min  . Stress: Not at all  Relationships  . Social connections:    Talks on phone: Not on file    Gets together: Not on file    Attends religious service: Not on file    Active member of club or organization: Not on file    Attends meetings of clubs or organizations: Not on file    Relationship status: Not on file  Other Topics Concern  . Not on file  Social History Narrative   Lives at home w/ her husband   Right-handed   About 1 cup of coffee every other day    Outpatient Encounter Medications as of 12/17/2018  Medication Sig  . BYSTOLIC 5 MG tablet TAKE 1 TABLET DAILY  . cholecalciferol (VITAMIN D) 1000 UNITS tablet Take 1,000 Units by mouth daily.   . ondansetron (ZOFRAN) 4 MG tablet Take 1 tablet (4 mg total) by mouth every 6 (six) hours.  . rosuvastatin (CRESTOR) 10 MG tablet TAKE 1 TABLET DAILY  . triamterene-hydrochlorothiazide (DYAZIDE) 37.5-25 MG capsule Take 1 capsule by mouth  every morning.  . zolpidem (AMBIEN) 5 MG tablet Take 5 mg by mouth at bedtime as needed for sleep.   Marland Kitchen thiamine (VITAMIN B-1) 100 MG tablet Take 1 tablet (100 mg total) by mouth daily. (Patient not taking: Reported on 11/11/2018)   No facility-administered encounter medications on file as of 12/17/2018.     Activities of Daily Living In your present state of health, do you have any difficulty performing the following activities: 12/17/2018  Hearing? N  Vision? Y  Comment trouble seeing far away  Difficulty  concentrating or making decisions? Y  Comment forgetfulness  Walking or climbing stairs? Y  Comment some trouble due to right foot pain  Dressing or bathing? N  Doing errands, shopping? N  Preparing Food and eating ? N  Using the Toilet? N  In the past six months, have you accidently leaked urine? N  Do you have problems with loss of bowel control? N  Managing your Medications? N  Managing your Finances? N  Housekeeping or managing your Housekeeping? N  Some recent data might be hidden    Patient Care Team: Glendale Chard, MD as PCP - General (Internal Medicine) Katy Fitch, Darlina Guys, MD as Consulting Physician (Ophthalmology)    Assessment:   This is a routine wellness examination for [place].  Exercise Activities and Dietary recommendations Current Exercise Habits: Home exercise routine, Type of exercise: calisthenics;walking, Time (Minutes): 30, Frequency (Times/Week): 3, Weekly Exercise (Minutes/Week): 90, Intensity: Moderate, Exercise limited by: None identified  Goals    . Patient Stated (pt-stated)     Plans to re join the gym       Fall Risk Fall Risk  12/17/2018 12/17/2018 11/11/2018 05/10/2017  Falls in the past year? 0 0 0 No  Comment - - - Emmi Telephone Survey: data to providers prior to load  Risk for fall due to : Medication side effect - - -  Follow up Education provided;Falls prevention discussed - - -   Is the patient's home free of loose throw rugs in  walkways, pet beds, electrical cords, etc?   yes      Grab bars in the bathroom? yes      Handrails on the stairs?   yes      Adequate lighting?   yes  Timed Get Up and Go performed: n/a  Depression Screen PHQ 2/9 Scores 12/17/2018 12/17/2018 11/11/2018  PHQ - 2 Score 0 0 0  PHQ- 9 Score 0 - -     Cognitive Function     6CIT Screen 12/17/2018  What Year? 0 points  What month? 0 points  What time? 0 points  Count back from 20 0 points  Months in reverse 0 points  Repeat phrase 0 points  Total Score 0    Immunization History  Administered Date(s) Administered  . Influenza, High Dose Seasonal PF 08/11/2018  . Pneumococcal Conjugate-13 11/08/2017  . Tdap 08/23/2017    Qualifies for Shingles Vaccine? yes  Screening Tests Health Maintenance  Topic Date Due  . MAMMOGRAM  12/27/2019  . COLONOSCOPY  04/09/2027  . TETANUS/TDAP  08/24/2027  . INFLUENZA VACCINE  Completed  . DEXA SCAN  Completed  . Hepatitis C Screening  Completed  . PNA vac Low Risk Adult  Completed    Cancer Screenings: Lung: Low Dose CT Chest recommended if Age 35-80 years, 30 pack-year currently smoking OR have quit w/in 15years. Patient does not qualify. Breast:  Up to date on Mammogram? Yes   Up to date of Bone Density/Dexa? Yes Colorectal: up to date  Additional Screenings: : Hepatitis C Screening: 11/23/2014 <0.1     Plan:   Plans to rejoin the gym.  I have personally reviewed and noted the following in the patient's chart:   . Medical and social history . Use of alcohol, tobacco or illicit drugs  . Current medications and supplements . Functional ability and status . Nutritional status . Physical activity . Advanced directives . List of other physicians . Hospitalizations, surgeries, and ER visits in previous 12 months .  Vitals . Screenings to include cognitive, depression, and falls . Referrals and appointments  In addition, I have reviewed and discussed with patient certain  preventive protocols, quality metrics, and best practice recommendations. A written personalized care plan for preventive services as well as general preventive health recommendations were provided to patient.     Kellie Simmering, LPN  10/31/3157

## 2018-12-18 ENCOUNTER — Encounter: Payer: Self-pay | Admitting: Nurse Practitioner

## 2018-12-18 LAB — CMP14 + ANION GAP
ALK PHOS: 72 IU/L (ref 39–117)
ALT: 14 IU/L (ref 0–32)
AST: 17 IU/L (ref 0–40)
Albumin/Globulin Ratio: 1.9 (ref 1.2–2.2)
Albumin: 4.4 g/dL (ref 3.8–4.8)
Anion Gap: 18 mmol/L (ref 10.0–18.0)
BILIRUBIN TOTAL: 0.2 mg/dL (ref 0.0–1.2)
BUN/Creatinine Ratio: 26 (ref 12–28)
BUN: 19 mg/dL (ref 8–27)
CHLORIDE: 103 mmol/L (ref 96–106)
CO2: 21 mmol/L (ref 20–29)
Calcium: 9.9 mg/dL (ref 8.7–10.3)
Creatinine, Ser: 0.74 mg/dL (ref 0.57–1.00)
GFR calc Af Amer: 96 mL/min/{1.73_m2} (ref >59)
GFR calc non Af Amer: 83 mL/min/{1.73_m2} (ref >59)
GLUCOSE: 92 mg/dL (ref 65–99)
Globulin, Total: 2.3 g/dL (ref 1.5–4.5)
Potassium: 4.3 mmol/L (ref 3.5–5.2)
Sodium: 142 mmol/L (ref 134–144)
Total Protein: 6.7 g/dL (ref 6.0–8.5)

## 2018-12-18 LAB — CBC
HEMOGLOBIN: 13.9 g/dL (ref 11.1–15.9)
Hematocrit: 43.2 % (ref 34.0–46.6)
MCH: 29.1 pg (ref 26.6–33.0)
MCHC: 32.2 g/dL (ref 31.5–35.7)
MCV: 90 fL (ref 79–97)
Platelets: 294 10*3/uL (ref 150–450)
RBC: 4.78 x10E6/uL (ref 3.77–5.28)
RDW: 14.4 % (ref 11.7–15.4)
WBC: 5.1 10*3/uL (ref 3.4–10.8)

## 2018-12-18 LAB — HEMOGLOBIN A1C
Est. average glucose Bld gHb Est-mCnc: 128 mg/dL
Hgb A1c MFr Bld: 6.1 % — ABNORMAL HIGH (ref 4.8–5.6)

## 2018-12-18 LAB — URIC ACID: Uric Acid: 6.8 mg/dL (ref 2.5–7.1)

## 2018-12-23 ENCOUNTER — Ambulatory Visit (HOSPITAL_COMMUNITY): Payer: Medicare Other

## 2018-12-23 ENCOUNTER — Inpatient Hospital Stay (HOSPITAL_COMMUNITY)
Admission: AD | Admit: 2018-12-23 | Discharge: 2018-12-25 | DRG: 603 | Disposition: A | Discharge status: Home / Self Care | Payer: Medicare Other | Source: Ambulatory Visit | Attending: Family Medicine | Admitting: Family Medicine

## 2018-12-23 ENCOUNTER — Encounter (HOSPITAL_COMMUNITY): Payer: Self-pay | Admitting: *Deleted

## 2018-12-23 ENCOUNTER — Other Ambulatory Visit: Payer: Self-pay

## 2018-12-23 ENCOUNTER — Ambulatory Visit (INDEPENDENT_AMBULATORY_CARE_PROVIDER_SITE_OTHER)
Admission: EM | Admit: 2018-12-23 | Discharge: 2018-12-23 | Disposition: A | Discharge status: Home / Self Care | Payer: Medicare Other | Source: Home / Self Care | Attending: Family Medicine | Admitting: Family Medicine

## 2018-12-23 ENCOUNTER — Encounter (HOSPITAL_COMMUNITY): Payer: Self-pay | Admitting: Emergency Medicine

## 2018-12-23 ENCOUNTER — Ambulatory Visit (INDEPENDENT_AMBULATORY_CARE_PROVIDER_SITE_OTHER): Payer: Medicare Other

## 2018-12-23 ENCOUNTER — Observation Stay (HOSPITAL_COMMUNITY): Payer: Medicare Other

## 2018-12-23 DIAGNOSIS — L0390 Cellulitis, unspecified: Secondary | ICD-10-CM | POA: Diagnosis present

## 2018-12-23 DIAGNOSIS — E782 Mixed hyperlipidemia: Secondary | ICD-10-CM

## 2018-12-23 DIAGNOSIS — Z9049 Acquired absence of other specified parts of digestive tract: Secondary | ICD-10-CM

## 2018-12-23 DIAGNOSIS — Z8249 Family history of ischemic heart disease and other diseases of the circulatory system: Secondary | ICD-10-CM

## 2018-12-23 DIAGNOSIS — L089 Local infection of the skin and subcutaneous tissue, unspecified: Secondary | ICD-10-CM | POA: Diagnosis not present

## 2018-12-23 DIAGNOSIS — L03115 Cellulitis of right lower limb: Principal | ICD-10-CM | POA: Diagnosis present

## 2018-12-23 DIAGNOSIS — R600 Localized edema: Secondary | ICD-10-CM | POA: Diagnosis not present

## 2018-12-23 DIAGNOSIS — Z880 Allergy status to penicillin: Secondary | ICD-10-CM

## 2018-12-23 DIAGNOSIS — Z96643 Presence of artificial hip joint, bilateral: Secondary | ICD-10-CM | POA: Diagnosis present

## 2018-12-23 DIAGNOSIS — M109 Gout, unspecified: Secondary | ICD-10-CM | POA: Diagnosis present

## 2018-12-23 DIAGNOSIS — Z8619 Personal history of other infectious and parasitic diseases: Secondary | ICD-10-CM

## 2018-12-23 DIAGNOSIS — Z90710 Acquired absence of both cervix and uterus: Secondary | ICD-10-CM

## 2018-12-23 DIAGNOSIS — R7303 Prediabetes: Secondary | ICD-10-CM | POA: Diagnosis present

## 2018-12-23 DIAGNOSIS — Z86718 Personal history of other venous thrombosis and embolism: Secondary | ICD-10-CM

## 2018-12-23 DIAGNOSIS — M79674 Pain in right toe(s): Secondary | ICD-10-CM

## 2018-12-23 DIAGNOSIS — M79671 Pain in right foot: Secondary | ICD-10-CM | POA: Diagnosis not present

## 2018-12-23 DIAGNOSIS — Z7722 Contact with and (suspected) exposure to environmental tobacco smoke (acute) (chronic): Secondary | ICD-10-CM | POA: Diagnosis present

## 2018-12-23 DIAGNOSIS — L03119 Cellulitis of unspecified part of limb: Secondary | ICD-10-CM | POA: Diagnosis not present

## 2018-12-23 DIAGNOSIS — Z888 Allergy status to other drugs, medicaments and biological substances status: Secondary | ICD-10-CM

## 2018-12-23 DIAGNOSIS — I10 Essential (primary) hypertension: Secondary | ICD-10-CM | POA: Diagnosis present

## 2018-12-23 DIAGNOSIS — E785 Hyperlipidemia, unspecified: Secondary | ICD-10-CM | POA: Diagnosis present

## 2018-12-23 DIAGNOSIS — G4700 Insomnia, unspecified: Secondary | ICD-10-CM | POA: Diagnosis present

## 2018-12-23 DIAGNOSIS — F1021 Alcohol dependence, in remission: Secondary | ICD-10-CM | POA: Diagnosis present

## 2018-12-23 DIAGNOSIS — Z833 Family history of diabetes mellitus: Secondary | ICD-10-CM

## 2018-12-23 LAB — COMPREHENSIVE METABOLIC PANEL
ALT: 16 U/L (ref 0–44)
AST: 18 U/L (ref 15–41)
Albumin: 4 g/dL (ref 3.5–5.0)
Alkaline Phosphatase: 70 U/L (ref 38–126)
Anion gap: 10 (ref 5–15)
BUN: 18 mg/dL (ref 8–23)
CO2: 26 mmol/L (ref 22–32)
Calcium: 9.6 mg/dL (ref 8.9–10.3)
Chloride: 106 mmol/L (ref 98–111)
Creatinine, Ser: 0.66 mg/dL (ref 0.44–1.00)
GFR calc Af Amer: >60 mL/min (ref >60)
GFR calc non Af Amer: >60 mL/min (ref >60)
Glucose, Bld: 96 mg/dL (ref 70–99)
Potassium: 3.7 mmol/L (ref 3.5–5.1)
SODIUM: 142 mmol/L (ref 135–145)
Total Bilirubin: 0.3 mg/dL (ref 0.3–1.2)
Total Protein: 7.6 g/dL (ref 6.5–8.1)

## 2018-12-23 LAB — CBC
HCT: 44 % (ref 36.0–46.0)
Hemoglobin: 13.1 g/dL (ref 12.0–15.0)
MCH: 29.2 pg (ref 26.0–34.0)
MCHC: 29.8 g/dL — ABNORMAL LOW (ref 30.0–36.0)
MCV: 98 fL (ref 80.0–100.0)
Platelets: 263 10*3/uL (ref 150–400)
RBC: 4.49 MIL/uL (ref 3.87–5.11)
RDW: 15.3 % (ref 11.5–15.5)
WBC: 5.7 10*3/uL (ref 4.0–10.5)
nRBC: 0 % (ref 0.0–0.2)

## 2018-12-23 LAB — SEDIMENTATION RATE: Sed Rate: 50 mm/hr — ABNORMAL HIGH (ref 0–22)

## 2018-12-23 MED ORDER — TETRAHYDROZOLINE HCL 0.05 % OP SOLN
1.0000 [drp] | Freq: Two times a day (BID) | OPHTHALMIC | Status: DC | PRN
  Filled 2018-12-23: qty 15

## 2018-12-23 MED ORDER — ENOXAPARIN SODIUM 40 MG/0.4ML ~~LOC~~ SOLN
40.0000 mg | SUBCUTANEOUS | Status: DC
  Administered 2018-12-23 – 2018-12-24 (×2): 40 mg via SUBCUTANEOUS
  Filled 2018-12-23 (×2): qty 0.4

## 2018-12-23 MED ORDER — ACETAMINOPHEN 650 MG RE SUPP: 650.0000 mg | Freq: Four times a day (QID) | RECTAL | Status: DC | PRN

## 2018-12-23 MED ORDER — ACETAMINOPHEN 325 MG PO TABS
650.0000 mg | ORAL_TABLET | Freq: Four times a day (QID) | ORAL | Status: DC | PRN
  Administered 2018-12-24 (×2): 650 mg via ORAL
  Filled 2018-12-23 (×2): qty 2

## 2018-12-23 MED ORDER — METHYLPREDNISOLONE SODIUM SUCC 40 MG IJ SOLR
40.0000 mg | INTRAMUSCULAR | Status: AC
  Administered 2018-12-23: 40 mg via INTRAVENOUS
  Filled 2018-12-23: qty 1

## 2018-12-23 MED ORDER — TRAMADOL HCL 50 MG PO TABS
50.0000 mg | ORAL_TABLET | Freq: Four times a day (QID) | ORAL | Status: DC | PRN
  Administered 2018-12-23 – 2018-12-25 (×5): 50 mg via ORAL
  Filled 2018-12-23 (×5): qty 1

## 2018-12-23 MED ORDER — CLINDAMYCIN PHOSPHATE 600 MG/50ML IV SOLN
600.0000 mg | Freq: Three times a day (TID) | INTRAVENOUS | Status: DC
  Administered 2018-12-23 – 2018-12-25 (×6): 600 mg via INTRAVENOUS
  Filled 2018-12-23 (×7): qty 50

## 2018-12-23 MED ORDER — ROSUVASTATIN CALCIUM 10 MG PO TABS
10.0000 mg | ORAL_TABLET | Freq: Every day | ORAL | Status: DC
  Administered 2018-12-23 – 2018-12-24 (×2): 10 mg via ORAL
  Filled 2018-12-23 (×2): qty 1

## 2018-12-23 MED ORDER — DIPHENHYDRAMINE HCL 25 MG PO CAPS
25.0000 mg | ORAL_CAPSULE | ORAL | Status: DC | PRN
  Administered 2018-12-24 (×2): 25 mg via ORAL
  Filled 2018-12-23 (×2): qty 1

## 2018-12-23 MED ORDER — ZOLPIDEM TARTRATE 5 MG PO TABS
5.0000 mg | ORAL_TABLET | Freq: Every evening | ORAL | Status: DC | PRN
  Administered 2018-12-24 (×2): 5 mg via ORAL
  Filled 2018-12-23 (×2): qty 1

## 2018-12-23 MED ORDER — NEBIVOLOL HCL 5 MG PO TABS
5.0000 mg | ORAL_TABLET | Freq: Every day | ORAL | Status: DC
  Administered 2018-12-23 – 2018-12-24 (×2): 5 mg via ORAL
  Filled 2018-12-23 (×2): qty 1

## 2018-12-23 MED ORDER — HYDROCODONE-ACETAMINOPHEN 5-325 MG PO TABS
ORAL_TABLET | ORAL | Status: AC
  Filled 2018-12-23: qty 1

## 2018-12-23 MED ORDER — HYDROCODONE-ACETAMINOPHEN 5-325 MG PO TABS
1.0000 | ORAL_TABLET | Freq: Once | ORAL | Status: AC
  Administered 2018-12-23: 1 via ORAL

## 2018-12-23 NOTE — H&P (Addendum)
History and Physical    [name] RWE:[id number] DOB: 1951-07-17 DOA: 12/23/2018  PCP: Glendale Chard, MD  Patient coming from: Home  Chief Complaint: R great toe swelling and pain  HPI: [name] is a 68 y.o. female with medical history significant of HTN, HLD, documented hx of gout presented to urgent care with one week hx of painful R great toe. Per history, pt has long hx of ETOH abuse, quit one month ago and completed outpatient treatment program. During this time, patient was given course of diuretic for "swelling." One week prior to urgent care visit, patient noted to have new pain and swelling in R large toe, later extending throughout distal R foot. Pt was given trial of cortisone injection by PCP without improvement. Later seen in Urgent Care who recommended direct admit for concerns of possible toe infection.   On further questioning, patient has similar hx in 2018 in L great toe that resolved with IV steroids and colchicine, although pt denies prior hx of gout.   Review of Systems:  Review of Systems  Constitutional: Negative for chills, fever and weight loss.  HENT: Negative for congestion, nosebleeds, sinus pain and tinnitus.   Eyes: Negative for double vision, photophobia, pain and discharge.  Respiratory: Negative for hemoptysis, sputum production and shortness of breath.   Cardiovascular: Negative for palpitations, orthopnea and claudication.  Gastrointestinal: Negative for nausea and vomiting.  Genitourinary: Negative for frequency, hematuria and urgency.  Musculoskeletal: Positive for joint pain. Negative for back pain and neck pain.  Neurological: Negative for tingling, tremors, seizures, loss of consciousness and weakness.  Psychiatric/Behavioral: Negative for hallucinations and substance abuse. The patient is not nervous/anxious and does not have insomnia.     Past Medical History:  Diagnosis Date  . Anxiety    takes Xanax prn  . Arthritis   .  Diverticulosis   . H/O blood transfusion reaction 2010   hives  . H/O migraine    last 34yr ago  . Headache(784.0)   . Heart murmur    takes Bystolic nightly  . History of blood clots 431yr  was on Lovenox injections and Coumadin(was only on that for short period of time)  . History of shingles [id number]  . Hx of seasonal allergies    takes Levocetirizine prn  . Hyperlipidemia    takes Crestor nightly  . Hypertension    takes Maxzide daily  . Insomnia    takes Ambien prn  . Joint pain   . Joint swelling   . Trigeminal neuralgia of right side of face 03/26/2016   V2 distribution    Past Surgical History:  Procedure Laterality Date  . ABDOMINAL HYSTERECTOMY    . BREAST BIOPSY  05/03/2006   US US BREAST REDUCTION SURGERY  1987  . CHOLECYSTECTOMY    . COLONOSCOPY    . ESOPHAGOGASTRODUODENOSCOPY    . EYE SURGERY Right    eye lash removed  . JOINT REPLACEMENT     bilateral hip rt in 2007 and lt in 2010  . NM MYOCAR PERF WALL MOTION  11/04/2008   Normal  . REDUCTION MAMMAPLASTY Bilateral 1988  . REDUCTION MAMMAPLASTY    . SHOULDER ARTHROSCOPY WITH DISTAL CLAVICLE RESECTION Right 09/21/2015   Procedure: RIGHT SHOULDER ARTHROSCOPY WITH DISTAL CLAVICLE EXCISION DEBRIDE LABRAL TEAR ACROMIOPLASTY ;  Surgeon: FraFrederik PearD;  [facility]  Laterality: Right;  . TEE WITHOUT CARDIOVERSION N/A 04/12/2015   Procedure: TRANSESOPHAGEAL  ECHOCARDIOGRAM (TEE)/BUBBLE STUDY;  Surgeon: Adrian Prows, MD;  Location: [facility];  Service: Cardiovascular;  Laterality: N/A;  . TOTAL HIP REVISION  04/14/2012   Procedure: TOTAL HIP REVISION;  Surgeon: Kerin Salen, MD;  Location: [facility];  Service: Orthopedics;  Laterality: Right;  right total hip revision  . TOTAL HIP REVISION Left 11/30/2013   Procedure: TOTAL HIP REVISION;  Surgeon: Kerin Salen, MD;  Location: [facility];  Service: Orthopedics;  Laterality: Left;     reports that she is a non-smoker but has  been exposed to tobacco smoke. She has been exposed to 0.25 packs per day for the past 42.00 years. She has never used smokeless tobacco. She reports previous alcohol use. She reports that she does not use drugs.  Allergies  Allergen Reactions  . Ibuprofen Hives and Swelling  . Penicillins Hives and Swelling    Has patient had a PCN reaction causing immediate rash, facial/tongue/throat swelling, SOB or lightheadedness with hypotension: Yes Has patient had a PCN reaction causing severe rash involving mucus membranes or skin necrosis: No Has patient had a PCN reaction that required hospitalization: No Has patient had a PCN reaction occurring within the last 10 years: No If all of the above answers are "NO", then may proceed with Cephalosporin use.     Family History  Problem Relation Age of Onset  . Heart attack Mother   . Alzheimer's disease Mother   . Diabetes Sister   . Hypertension Sister     Prior to Admission medications   Medication Sig Start Date End Date Taking? Authorizing Provider  BYSTOLIC 5 MG tablet TAKE 1 TABLET DAILY 12/16/18  Yes Glendale Chard, MD  cholecalciferol (VITAMIN D) 1000 UNITS tablet Take 1,000 Units by mouth daily.    Yes [provider]  rosuvastatin (CRESTOR) 10 MG tablet TAKE 1 TABLET DAILY 12/01/18  Yes Glendale Chard, MD  triamterene-hydrochlorothiazide (DYAZIDE) 37.5-25 MG capsule Take 1 capsule by mouth every morning.   Yes [provider]  zolpidem (AMBIEN) 5 MG tablet Take 5 mg by mouth at bedtime as needed for sleep.  03/18/16  Yes [provider]    Physical Exam: Vitals:   12/23/18 1720  TempSrc: Oral  Weight: 68.5 kg  Height: 5' (1.524 m)    Constitutional: NAD, calm, comfortable Vitals:   12/23/18 1720  TempSrc: Oral  Weight: 68.5 kg  Height: 5' (1.524 m)   Eyes: PERRL, lids and conjunctivae normal ENMT: Mucous membranes are moist. Posterior pharynx clear of any exudate or lesions.Normal dentition.  Neck:  normal, supple, no masses, no thyromegaly Respiratory: clear to auscultation bilaterally, no wheezing, no crackles. Normal respiratory effort. No accessory muscle use.  Cardiovascular: Regular rate and rhythm, s1, s2  Abdomen: no tenderness, no masses palpated. No hepatosplenomegaly. Bowel sounds positive.  Musculoskeletal: no clubbing / cyanosis. Good ROM, no contractures. Normal muscle tone. R large toe and distal foot erythematous and tender on minimal palpation Skin: no rashes, lesions, ulcers. No induration Neurologic: CN 2-12 grossly intact. Sensation intact, DTR normal. Strength 5/5 in all 4.  Psychiatric: Normal judgment and insight. Alert and oriented x 3. Normal mood.    Labs on Admission: I have personally reviewed following labs and imaging studies  CBC: Recent Labs  Lab 12/17/18 1429  WBC 5.1  HGB 13.9  HCT 43.2  MCV 90  PLT 599   Basic Metabolic Panel: Recent Labs  Lab 12/17/18 1429  NA 142  K 4.3  CL 103  CO2  21  GLUCOSE 92  BUN 19  CREATININE 0.74  CALCIUM 9.9   GFR: Estimated Creatinine Clearance: 57.3 mL/min (by C-G formula based on SCr of 0.74 mg/dL). Liver Function Tests: Recent Labs  Lab 12/17/18 1429  AST 17  ALT 14  ALKPHOS 72  BILITOT 0.2  PROT 6.7  ALBUMIN 4.4   No results for input(s): LIPASE, AMYLASE in the last 168 hours. No results for input(s): AMMONIA in the last 168 hours. Coagulation Profile: No results for input(s): INR, PROTIME in the last 168 hours. Cardiac Enzymes: No results for input(s): CKTOTAL, CKMB, CKMBINDEX, TROPONINI in the last 168 hours. BNP (last 3 results) No results for input(s): PROBNP in the last 8760 hours. HbA1C: No results for input(s): HGBA1C in the last 72 hours. CBG: No results for input(s): GLUCAP in the last 168 hours. Lipid Profile: No results for input(s): CHOL, HDL, LDLCALC, TRIG, CHOLHDL, LDLDIRECT in the last 72 hours. Thyroid Function Tests: No results for input(s): TSH, T4TOTAL, FREET4,  T3FREE, THYROIDAB in the last 72 hours. Anemia Panel: No results for input(s): VITAMINB12, FOLATE, FERRITIN, TIBC, IRON, RETICCTPCT in the last 72 hours. Urine analysis:    Component Value Date/Time   COLORURINE YELLOW 09/17/2018 1602   APPEARANCEUR HAZY (A) 09/17/2018 1602   LABSPEC >1.046 (H) 09/17/2018 1602   PHURINE 6.0 09/17/2018 1602   GLUCOSEU NEGATIVE 09/17/2018 1602   HGBUR NEGATIVE 09/17/2018 1602   BILIRUBINUR negative 12/17/2018 1552   KETONESUR 5 (A) 09/17/2018 1602   PROTEINUR Negative 12/17/2018 1552   PROTEINUR NEGATIVE 09/17/2018 1602   UROBILINOGEN 0.2 12/17/2018 1552   UROBILINOGEN 0.2 03/03/2015 0510   NITRITE negative 12/17/2018 1552   NITRITE NEGATIVE 09/17/2018 1602   LEUKOCYTESUR Negative 12/17/2018 1552   Sepsis Labs: !!!!!!!!!!!!!!!!!!!!!!!!!!!!!!!!!!!!!!!!!!!! _0 (procalcitonin:4,lacticidven:4) )No results found for this or any previous visit (from the past 240 hour(s)).   Radiological Exams on Admission: Dg Toe Great Right  Result Date: 12/23/2018 CLINICAL DATA:  Patient c/o right great toe pain x 2 weeks, NKI. Pain in joint r/o gout vs infection per provider. EXAM: RIGHT GREAT TOE COMPARISON:  None. FINDINGS: No fracture or bone lesion. There is a prominent hallux valgus deformity. Mild bony prominence from the medial margin of the first metatarsal head is consistent with a small bunion. The first metatarsophalangeal joint is well preserved. No subchondral sclerosis, cystic change, marginal osteophytes for marginal erosions. There is prominent soft tissue swelling over the medial aspect of the first metatarsal head. No soft tissue calcification or ossification. IMPRESSION: 1. No fracture or acute finding.  No bone lesion. 2. Prominent hallux valgus deformity with a small bunion from the medial first metatarsal head. 3. No first metatarsophalangeal joint arthropathic changes, but there is medial soft tissue swelling. Electronically Signed   By: Lajean Manes M.D.   On: 12/23/2018 11:08     Assessment/Plan Principal Problem:   Foot infection Active Problems:   Hyperlipidemia   Hypertension   Cellulitis of foot   Pain of right great toe   Prediabetes   1. R great toe swelling 1. Presents with 1 week hx of R large toe pain and swelling, hx gout 2. Not improved with steroid injection as outpt 3. Was accepted in direct admit for concerns of possible developing R foot infection 4. Currently afebrle. Recent CBC without leukocytosis 5. Uric acid within normal limits, although may be normal during acute gout flare. Pt does have a prior hx of gout 6. Will start empiric clindamycin and order MRI R  foot 7. If MRI neg, then consider d/c abx 8. Will give trial of solumedrol 9. Will check ESR 10. Check bmet and CBC 11. Consult PT 2. HLD 1. Cont home meds as tolerated 2. Stable at present 3. HTN 1. Stable and controlled 2. Hold diuretics given concerns of possible gout flare  DVT prophylaxis: Lovenox subQ  Code Status: Full Family Communication: Pt in room  Disposition Plan: Uncertain at this time  Consults called:  Admission status: Observation as would likely require less than 2 midnight stay for work up and treat R toe swelling   Marylu Lund MD [facility] Pager On Amion  If 7PM-7AM, please contact night-coverage  12/23/2018, 5:45 PM

## 2018-12-23 NOTE — Plan of Care (Signed)
[facility] ENCOUNTER NOTE  Patient: [name] [id number]:[id number]   PCP: Glendale Chard, MD DOB: [birth year]   DOS: 12/23/2018     Received a call from Dr. Berenice Primas.  Patient was seen at the urgent care earlier this morning 12/23/2018.  Presented with complaints of right great toe swelling and pain.  PCP gave her steroid shot without any improvement.  With concern for septic foot patient was sent to orthopedic office.  Dr. Berenice Primas evaluated the patient and felt that the patient will require IV antibiotics as outpatient management will not be adequate. Patient has a prior history of same few years ago. Hemoglobin A1c under control. Pain under control. Vitals stable. Accepted the patient for MedSurg.  Author:  Berle Mull, MD [facility] 12/23/2018  If 7PM-7AM, please contact night-coverage To reach On-call, see [email or web address]

## 2018-12-23 NOTE — Discharge Instructions (Addendum)
This swelling and pain could be from an inflammatory reaction like gout, or could possibly be an infection. It is important that you see an orthopedic surgeon.  I have made arrangements for you to see Dr. Damita Dunnings partner, Dr. Dorna Leitz a today at 145. He is at the [facility] orthopedic office.

## 2018-12-23 NOTE — Progress Notes (Signed)
MRI on R foot result came back; Provider on call notified. awaiting for further order;

## 2018-12-23 NOTE — ED Triage Notes (Signed)
Pt states shes had R foot swelling on the 24th of feb, states shes had a hx of gout. Saw her PCP on the 26th and they gave her a steroid injection. I didn't help.

## 2018-12-23 NOTE — Progress Notes (Signed)
Patient was sent down for MRI R foot.

## 2018-12-23 NOTE — ED Provider Notes (Addendum)
[facility]    CSN: [id number] Arrival date & time: 12/23/18  2094     History   Chief Complaint Chief Complaint  Patient presents with  . Foot Swelling    HPI [name] is a 68 y.o. female.   HPI  Patient states she has pain and swelling in her right great toe for about a week.  She went to her family doctor.  They gave her a shot of cortisone.  It did not help at all.  She has a history of gout, but states that she is not on any gout medicine.  She does take a diuretic daily for hypertension.  Previously was a heavy drinker but states she went through rehab and has not had any alcohol since January.  She tries to follow a healthy diet.  She has not had any fever or chills.  Her foot is severely painful.  Denies trauma.  Her most recent uric acid drawn last week was normal.  Hemoglobin A1c is 6.1, she is a well-controlled diabetic  Past Medical History:  Diagnosis Date  . Anxiety    takes Xanax prn  . Arthritis   . Diverticulosis   . H/O blood transfusion reaction 2010   hives  . H/O migraine    last 65yrs ago  . Headache(784.0)   . Heart murmur    takes Bystolic nightly  . History of blood clots 38yrs   was on Lovenox injections and Coumadin(was only on that for short period of time)  . History of shingles 3-35yrs ago  . Hx of seasonal allergies    takes Levocetirizine prn  . Hyperlipidemia    takes Crestor nightly  . Hypertension    takes Maxzide daily  . Insomnia    takes Ambien prn  . Joint pain   . Joint swelling   . Trigeminal neuralgia of right side of face 03/26/2016   V2 distribution    Patient Active Problem List   Diagnosis Date Noted  . Pain of right great toe 12/17/2018  . Prediabetes 12/17/2018  . Cellulitis of foot 08/23/2017  . Hypokalemia 08/23/2017  . Trigeminal neuralgia of right side of face 03/26/2016  . Hyperlipidemia 12/04/2013  . Hypertension 12/04/2013  . Constipation 12/04/2013  . Insomnia 12/04/2013  . Acute blood  loss anemia 12/04/2013  . Left hip pain 12/04/2013  . 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 Pain due to total hip replacement ([place]) 04/16/2012    Past Surgical History:  Procedure Laterality Date  . ABDOMINAL HYSTERECTOMY    . BREAST BIOPSY  05/03/2006   [place]  . BREAST REDUCTION SURGERY  1987  . CHOLECYSTECTOMY    . COLONOSCOPY    . ESOPHAGOGASTRODUODENOSCOPY    . EYE SURGERY Right    eye lash removed  . JOINT REPLACEMENT     bilateral hip rt in 2007 and lt in 2010  . NM MYOCAR PERF WALL MOTION  11/04/2008   Normal  . REDUCTION MAMMAPLASTY Bilateral 1988  . REDUCTION MAMMAPLASTY    . SHOULDER ARTHROSCOPY WITH DISTAL CLAVICLE RESECTION Right 09/21/2015   Procedure: RIGHT SHOULDER ARTHROSCOPY WITH DISTAL CLAVICLE EXCISION DEBRIDE LABRAL TEAR ACROMIOPLASTY ;  Surgeon: Frederik Pear, MD;  Location: [facility];  Service: Orthopedics;  Laterality: Right;  . TEE WITHOUT CARDIOVERSION N/A 04/12/2015   Procedure: TRANSESOPHAGEAL ECHOCARDIOGRAM (TEE)/BUBBLE STUDY;  Surgeon: Adrian Prows, MD;  Location: [facility];  Service: Cardiovascular;  Laterality: N/A;  . TOTAL HIP REVISION  04/14/2012   Procedure: TOTAL HIP REVISION;  Surgeon: Kerin Salen, MD;  Location: [facility];  Service: Orthopedics;  Laterality: Right;  right total hip revision  . TOTAL HIP REVISION Left 11/30/2013   Procedure: TOTAL HIP REVISION;  Surgeon: Kerin Salen, MD;  Location: [facility];  Service: Orthopedics;  Laterality: Left;    OB History   No obstetric history on file.      Home Medications    Prior to Admission medications   Medication Sig Start Date End Date Taking? Authorizing Provider  BYSTOLIC 5 MG tablet TAKE 1 TABLET DAILY 12/16/18   Glendale Chard, MD  cholecalciferol (VITAMIN D) 1000 UNITS tablet Take 1,000 Units by mouth daily.     [provider]  rosuvastatin (CRESTOR) 10 MG tablet TAKE 1 TABLET DAILY 12/01/18   Glendale Chard, MD  triamterene-hydrochlorothiazide (DYAZIDE) 37.5-25 MG  capsule Take 1 capsule by mouth every morning.    [provider]  zolpidem (AMBIEN) 5 MG tablet Take 5 mg by mouth at bedtime as needed for sleep.  03/18/16   [provider]    Family History Family History  Problem Relation Age of Onset  . Heart attack Mother   . Alzheimer's disease Mother   . Diabetes Sister   . Hypertension Sister     Social History Social History   Tobacco Use  . Smoking status: Passive Smoke Exposure - Never Smoker  . Smokeless tobacco: Never Used  Substance Use Topics  . Alcohol use: Not Currently    Comment: occasionally  . Drug use: No     Allergies   Ibuprofen and Penicillins   Review of Systems Review of Systems  Constitutional: Negative for chills and fever.  HENT: Negative for ear pain and sore throat.   Eyes: Negative for pain and visual disturbance.  Respiratory: Negative for cough and shortness of breath.   Cardiovascular: Negative for chest pain and palpitations.  Gastrointestinal: Negative for abdominal pain and vomiting.  Genitourinary: Negative for dysuria and hematuria.  Musculoskeletal: Positive for arthralgias and gait problem. Negative for back pain.  Skin: Negative for color change and rash.  Neurological: Negative for seizures and syncope.  All other systems reviewed and are negative.    Physical Exam Triage Vital Signs ED Triage Vitals  Enc Vitals Group     BP 12/23/18 1022 107/74     Pulse Rate 12/23/18 1022 83     Resp 12/23/18 1022 16     Temp 12/23/18 1022 (!) 97.5 F (36.4 C)     Temp src --      SpO2 12/23/18 1022 99 %     Weight --      Height --      Head Circumference --      Peak Flow --      Pain Score 12/23/18 1024 6     Pain Loc --      Pain Edu? --      Excl. in [place]? --    No data found.  Updated Vital Signs BP 107/74   Pulse 83   Temp (!) 97.5 F (36.4 C)   Resp 16   SpO2 99%    Physical Exam Constitutional:      General: She is not in acute distress.     Appearance: She is well-developed.  HENT:     Head: Normocephalic and atraumatic.  Eyes:     Conjunctiva/sclera: Conjunctivae normal.     Pupils: Pupils are equal, round,  and reactive to light.  Neck:     Musculoskeletal: Normal range of motion.  Cardiovascular:     Rate and Rhythm: Normal rate.  Pulmonary:     Effort: Pulmonary effort is normal. No respiratory distress.  Abdominal:     General: There is no distension.     Palpations: Abdomen is soft.  Musculoskeletal: Normal range of motion.  Skin:    General: Skin is warm and dry.     Comments: As can been seen in the photograph, there is redness swelling all around to the first MP joint with a yellow fluctuance laterally that measures about 15 mm.  I am unable to determine whether this is a tophus versus infection.  Neurological:     General: No focal deficit present.     Mental Status: She is alert and oriented to person, place, and time.  Psychiatric:        Mood and Affect: Mood normal.        UC Treatments / Results  Labs (all labs ordered are listed, but only abnormal results are displayed) Labs Reviewed - No data to display  EKG None  Radiology Dg Toe Great Right  Result Date: 12/23/2018 CLINICAL DATA:  Patient c/o right great toe pain x 2 weeks, NKI. Pain in joint r/o gout vs infection per provider. EXAM: RIGHT GREAT TOE COMPARISON:  None. FINDINGS: No fracture or bone lesion. There is a prominent hallux valgus deformity. Mild bony prominence from the medial margin of the first metatarsal head is consistent with a small bunion. The first metatarsophalangeal joint is well preserved. No subchondral sclerosis, cystic change, marginal osteophytes for marginal erosions. There is prominent soft tissue swelling over the medial aspect of the first metatarsal head. No soft tissue calcification or ossification. IMPRESSION: 1. No fracture or acute finding.  No bone lesion. 2. Prominent hallux valgus deformity with a small  bunion from the medial first metatarsal head. 3. No first metatarsophalangeal joint arthropathic changes, but there is medial soft tissue swelling. Electronically Signed   By: Lajean Manes M.D.   On: 12/23/2018 11:08    Procedures Procedures (including critical care time)  Medications Ordered in UC Medications  HYDROcodone-acetaminophen (NORCO/VICODIN) 5-325 MG per tablet 1 tablet (1 tablet Oral Given 12/23/18 1139)    Initial Impression / Assessment and Plan / UC Course  I have reviewed the triage vital signs and the nursing notes.  Pertinent labs & imaging results that were available during my care of the patient were reviewed by me and considered in my medical decision making (see chart for details).     Patient's usual orthopedic surgeon is Dr. Mayer Camel.  I called [facility] and he is out of the office.  Dr. Berenice Primas is on-call for her doctor, and will see her today at 23.  This 2 hours for now.  I did give her Vicodin for pain, tell her to eat a light lunch, and go see Dr. Berenice Primas.  I advised her that this may be opened to test the fluid for analysis.  Patient expresses understanding Final Clinical Impressions(s) / UC Diagnoses   Final diagnoses:  Toe pain, right     Discharge Instructions     This swelling and pain could be from an inflammatory reaction like gout, or could possibly be an infection. It is important that you see an orthopedic surgeon.  I have made arrangements for you to see Dr. Damita Dunnings partner, Dr. Dorna Leitz a today at 145. He is at  the [facility] orthopedic office.    ED Prescriptions    None     Controlled Substance Prescriptions [place] Controlled Substance Registry consulted? Not Applicable   Raylene Everts, MD 12/23/18 1141    Raylene Everts, MD 12/23/18 2141

## 2018-12-24 ENCOUNTER — Ambulatory Visit: Payer: Medicare Other | Admitting: Internal Medicine

## 2018-12-24 DIAGNOSIS — L0390 Cellulitis, unspecified: Secondary | ICD-10-CM | POA: Diagnosis present

## 2018-12-24 DIAGNOSIS — M109 Gout, unspecified: Secondary | ICD-10-CM | POA: Diagnosis present

## 2018-12-24 DIAGNOSIS — Z8249 Family history of ischemic heart disease and other diseases of the circulatory system: Secondary | ICD-10-CM | POA: Diagnosis not present

## 2018-12-24 DIAGNOSIS — L03115 Cellulitis of right lower limb: Secondary | ICD-10-CM | POA: Diagnosis not present

## 2018-12-24 DIAGNOSIS — Z833 Family history of diabetes mellitus: Secondary | ICD-10-CM | POA: Diagnosis not present

## 2018-12-24 DIAGNOSIS — Z86718 Personal history of other venous thrombosis and embolism: Secondary | ICD-10-CM | POA: Diagnosis not present

## 2018-12-24 DIAGNOSIS — R7303 Prediabetes: Secondary | ICD-10-CM

## 2018-12-24 DIAGNOSIS — Z7722 Contact with and (suspected) exposure to environmental tobacco smoke (acute) (chronic): Secondary | ICD-10-CM | POA: Diagnosis present

## 2018-12-24 DIAGNOSIS — G4700 Insomnia, unspecified: Secondary | ICD-10-CM | POA: Diagnosis present

## 2018-12-24 DIAGNOSIS — M79674 Pain in right toe(s): Secondary | ICD-10-CM | POA: Diagnosis not present

## 2018-12-24 DIAGNOSIS — Z888 Allergy status to other drugs, medicaments and biological substances status: Secondary | ICD-10-CM | POA: Diagnosis not present

## 2018-12-24 DIAGNOSIS — I10 Essential (primary) hypertension: Secondary | ICD-10-CM | POA: Diagnosis not present

## 2018-12-24 DIAGNOSIS — Z96643 Presence of artificial hip joint, bilateral: Secondary | ICD-10-CM | POA: Diagnosis present

## 2018-12-24 DIAGNOSIS — L089 Local infection of the skin and subcutaneous tissue, unspecified: Secondary | ICD-10-CM | POA: Diagnosis not present

## 2018-12-24 DIAGNOSIS — Z8619 Personal history of other infectious and parasitic diseases: Secondary | ICD-10-CM | POA: Diagnosis not present

## 2018-12-24 DIAGNOSIS — Z880 Allergy status to penicillin: Secondary | ICD-10-CM | POA: Diagnosis not present

## 2018-12-24 DIAGNOSIS — F1021 Alcohol dependence, in remission: Secondary | ICD-10-CM | POA: Diagnosis present

## 2018-12-24 DIAGNOSIS — Z90710 Acquired absence of both cervix and uterus: Secondary | ICD-10-CM | POA: Diagnosis not present

## 2018-12-24 DIAGNOSIS — E785 Hyperlipidemia, unspecified: Secondary | ICD-10-CM | POA: Diagnosis present

## 2018-12-24 DIAGNOSIS — Z9049 Acquired absence of other specified parts of digestive tract: Secondary | ICD-10-CM | POA: Diagnosis not present

## 2018-12-24 DIAGNOSIS — E782 Mixed hyperlipidemia: Secondary | ICD-10-CM | POA: Diagnosis not present

## 2018-12-24 DIAGNOSIS — L03119 Cellulitis of unspecified part of limb: Secondary | ICD-10-CM | POA: Diagnosis not present

## 2018-12-24 LAB — CBC
HCT: 39.2 % (ref 36.0–46.0)
Hemoglobin: 12.1 g/dL (ref 12.0–15.0)
MCH: 29.6 pg (ref 26.0–34.0)
MCHC: 30.9 g/dL (ref 30.0–36.0)
MCV: 95.8 fL (ref 80.0–100.0)
Platelets: 280 10*3/uL (ref 150–400)
RBC: 4.09 MIL/uL (ref 3.87–5.11)
RDW: 14.8 % (ref 11.5–15.5)
WBC: 4.9 10*3/uL (ref 4.0–10.5)
nRBC: 0 % (ref 0.0–0.2)

## 2018-12-24 LAB — BASIC METABOLIC PANEL
Anion gap: 8 (ref 5–15)
BUN: 22 mg/dL (ref 8–23)
CO2: 23 mmol/L (ref 22–32)
Calcium: 9.3 mg/dL (ref 8.9–10.3)
Chloride: 106 mmol/L (ref 98–111)
Creatinine, Ser: 0.62 mg/dL (ref 0.44–1.00)
GFR calc Af Amer: >60 mL/min (ref >60)
Glucose, Bld: 143 mg/dL — ABNORMAL HIGH (ref 70–99)
Potassium: 4.2 mmol/L (ref 3.5–5.1)
Sodium: 137 mmol/L (ref 135–145)

## 2018-12-24 LAB — HIV ANTIBODY (ROUTINE TESTING W REFLEX): HIV SCREEN 4TH GENERATION: NONREACTIVE

## 2018-12-24 MED ORDER — COLCHICINE 0.6 MG PO TABS
1.2000 mg | ORAL_TABLET | Freq: Once | ORAL | Status: AC
  Administered 2018-12-24: 1.2 mg via ORAL
  Filled 2018-12-24: qty 2

## 2018-12-24 MED ORDER — METHYLPREDNISOLONE SODIUM SUCC 40 MG IJ SOLR
40.0000 mg | Freq: Every day | INTRAMUSCULAR | Status: DC
  Administered 2018-12-24 – 2018-12-25 (×2): 40 mg via INTRAVENOUS
  Filled 2018-12-24 (×2): qty 1

## 2018-12-24 MED ORDER — SACCHAROMYCES BOULARDII 250 MG PO CAPS
250.0000 mg | ORAL_CAPSULE | Freq: Two times a day (BID) | ORAL | Status: DC
  Administered 2018-12-24 – 2018-12-25 (×2): 250 mg via ORAL
  Filled 2018-12-24 (×2): qty 1

## 2018-12-24 NOTE — Care Management Obs Status (Signed)
[facility] NOTIFICATION   Patient Details  Name: [name] MRN: [id number] Date of Birth: [birth year]   Medicare Observation Status Notification Given:  Yes    Leeroy Cha, RN 12/24/2018, 11:37 AM

## 2018-12-24 NOTE — Progress Notes (Signed)
Initial Nutrition Assessment  DOCUMENTATION CODES:   Not applicable  INTERVENTION:  Pt to order meals. Encouraged to order snacks if meals weren't enough. Pt doesn't care for ensure, and didn't feel the need to have set snacks or snack times.    NUTRITION DIAGNOSIS:   Inadequate oral intake related to other (see comment)(Pain from Gout Flare) as evidenced by per patient/family report.   GOAL:   Patient will meet greater than or equal to 90% of their needs    MONITOR:   PO intake, Weight trends, Labs  REASON FOR ASSESSMENT:   Malnutrition Screening Tool(MST 2)    ASSESSMENT:   Pt is a 74y F with PMH of HTN, HLD, Hx of gout, Hx of EtOH abuse (quit 1 month ago with outpatient treatment).  Pt is now admitted with pain and swelling in R Great Toe with infection.   Pt states that her appetite has been increasing to good. Over the last 1 maybe 2 weeks it had decreased but coming back. Pt wasn't able to contribute anything specific to appetite decline. Meal completion has been 50-100% per chart. Pt is retired and will eat whenever she feels like it. She likes to eat grapes in the morning. For lunch and or dinner she will eat items like salmon, sweet potatoes, salad, or [employer] fried chicken. Pt states that she has ensure at home in the fridge, but neither her or husband cares to drink it.   Pt recognized in self that Alcoholism was sneaking up on her and went for outpatient AA to help quit. Pt states "when you drink bloody mary's at 6 am you have a issue". Pt hasn't drank since Jan. 2020.  Pt endorse weight loss but states "it has been a minute"/ not recent. Pt states that her UBW is 160# and is currently 150.7, and states she is gaining her weight back. This is concurrent with weight encounters in chart.   Pt is ambulatory at baseline. Pt states that yesterday at admission she had to use an aid to get around because of the pain in her toe//foot, but up and able to walk today. Pt states  that she is supposed to be discharged tomorrow.    Labs reviewed:  Glucose 143 (H)  Medications reviewed.    NUTRITION - FOCUSED PHYSICAL EXAM:    Most Recent Value  Orbital Region  No depletion  Upper Arm Region  No depletion  Thoracic and Lumbar Region  No depletion  Buccal Region  No depletion  Temple Region  No depletion  Clavicle Bone Region  No depletion  Clavicle and Acromion Bone  Region  No depletion  Scapular Bone Region  No depletion  Dorsal Hand  No depletion  Patellar Region  No depletion  Anterior Thigh Region  No depletion  Posterior Calf Region  No depletion  Edema (RD Assessment)  None  Hair  Reviewed  Eyes  Reviewed  Mouth  Reviewed  Skin  Reviewed  Nails  Reviewed       Diet Order:   Diet Order            Diet regular Room service appropriate? Yes; Fluid consistency: Thin  Diet effective now              EDUCATION NEEDS:   No education needs have been identified at this time  Skin:  Skin Assessment: Reviewed RN Assessment  Last BM:  3/2  Height:   Ht Readings from Last 1 Encounters:  12/23/18 5' (  1.524 m)    Weight:   Wt Readings from Last 1 Encounters:  12/23/18 68.5 kg    Ideal Body Weight:  45.5 kg  BMI:  Body mass index is 29.49 kg/m.  Estimated Nutritional Needs:   Kcal:  1400-1700  Protein:  70-85 grams  Fluid:  >1.5L    Herma Carson, [facility] [employer]

## 2018-12-24 NOTE — Progress Notes (Signed)
I saw patient in my ofice yesterday and felt clinical picture and exam most consistent with gout.  Felt that I could not rule out infection and admission was based on that.  I have reviewed MRI and again feel most c/w gout but cannot rule out infection.  Agree with steroid and ABx treatment with management felt best for patient.  I will be able to see much later today after surgery.  If felt that D/C appropriate prior to my eval based on clinical picture, I am happy to see in follow up in office. Dorna Leitz

## 2018-12-24 NOTE — Care Management CC44 (Signed)
Condition Code 44 Documentation Completed  Patient Details  Name: [name] MRN: [id number] Date of Birth: 1951/05/12   Condition Code 44 given:    Patient signature on Condition Code 44 notice:    Documentation of 2 MD's agreement:    Code 44 added to claim:       Leeroy Cha, RN 12/24/2018, 11:37 AM

## 2018-12-24 NOTE — Progress Notes (Addendum)
PROGRESS NOTE  KEAIRA WHITEHURST  IWP:[id number] DOB: 1950/12/05 DOA: 12/23/2018 PCP: Glendale Chard, MD  Outpatient Specialists: Orthopedics, Dr. Berenice Primas Brief Narrative: [name] is a 68 y.o. 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 She reports a week of gradually worsening pain in the right first toe and foot becoming severe, limiting weight bearing. She had an intraarticular steroid injection without significant improvement, later on 3/3 presented to UC initially where discoloration and pain over the 1st MTP was felt to be either infection or tophus. She was referred urgently to orthopedics, Dr. Berenice Primas, who also felt that the distinction couldn't be reliably made based on exam alone and referred her to the ED. She was afebrile with elevated ESR no leukocytosis, and referred for admission with MRI recommended. Antibiotics and steroids were started. MRI showed inflammatory changes that are nonspecific.  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 No drainable abscess on MRI. However, clinical picture and imaging is equivocal and risk of adverse outcome is unacceptably high if infection is not treated.   - Continue clindamycin empirically. Also on probiotic.  - Give colchicine 1.63m now and 0.664mdaily. Has history of hives with ibuprofen. Can also give tramadol, but would use caution with any controlled substance for this patient newly in recovery from alcohol abuse. - Repeat IV steroids today with plans for taper - Discussed with Dr. GrBerenice Primasho will evalute the patient later today. The patient reports  severe pain, worse than prior flare that kept her in the hospital for 5 days and an additional day of observation is recommended.  Alcohol abuse: [place]  - Avoid benzodiazepines and other psychotropic medications as much as possible - Encouraged to continue meeting attendance and to establish a sponsor.   HTN:  - Continue bystolic  HLD:  - Continue statin.  Prediabetes: HbA1c 6.1%. - Follow up with PCP.   DVT prophylaxis: Lovenox Code Status: Full Family Communication: None at bedside Disposition Plan: Discharge 3/5 early AM if stable.  Consultants:   Dr. GrBerenice Primas Procedures:   None  Antimicrobials:  Clindamycin   Subjective: Right 1st toe pain radiating to the foot and across the toes is slightly improved. She is able to let the covers touch her foot now, swelling is stable. No fevers or chills.  Objective: Vitals:   12/23/18 1818 12/23/18 2248 12/24/18 0617 12/24/18 1333  BP: 107/74 123/84 120/79 112/65  Pulse: 83 78 63 (!) 59  Resp: _0 Temp: (!) 97.5 F (36.4 C) 98.1 F (36.7 C) (!) 97.5 F (36.4 C) 98.3 F (36.8 C)  TempSrc: Oral Oral    SpO2:  95% 97% 95%  Weight: 68.5 kg     Height: 5' (1.524 m)       Intake/Output Summary (Last 24 hours) at 12/24/2018 1604 Last data filed at 12/24/2018 1500 Gross per 24 hour  Intake 2070 ml  Output -  Net 2070 ml   Filed Weights   12/23/18 1720 12/23/18 1818  Weight: 68.5 kg 68.5 kg    Gen: 68 y.o. female in no distress  Pulm: Non-labored breathing room air. Clear to auscultation bilaterally.  CV: Regular rate and rhythm. No murmur, rub, or gallop. No JVD, no pedal edema. GI: Abdomen soft, non-tender, non-distended, with normoactive bowel sounds. No organomegaly or masses felt. Ext: Warm, no deformities Skin: Right foot diffusely edematous with hyperpigmentation dorsally on 1st MTP. Laterally there is focal swelling with  no definite fluctuance (limited exam due to pain) overlying bunion deformity. Neuro: Alert and oriented. No focal neurological deficits. Psych: Judgement and insight appear normal. Mood & affect appropriate.   Data Reviewed: I have personally reviewed following labs and imaging studies  CBC: Recent Labs  Lab 12/23/18 1840 12/24/18 0521  WBC 5.7 4.9  HGB 13.1 12.1  HCT 44.0 39.2  MCV 98.0 95.8  PLT 263 119   Basic Metabolic Panel: Recent Labs  Lab 12/23/18 1840 12/24/18 0521  NA 142 137  K 3.7 4.2  CL 106 106  CO2 26 23  GLUCOSE 96 143*  BUN 18 22  CREATININE 0.66 0.62  CALCIUM 9.6 9.3   GFR: Estimated Creatinine Clearance: 57.3 mL/min (by C-G formula based on SCr of 0.62 mg/dL). Liver Function Tests: Recent Labs  Lab 12/23/18 1840  AST 18  ALT 16  ALKPHOS 70  BILITOT 0.3  PROT 7.6  ALBUMIN 4.0   No results for input(s): LIPASE, AMYLASE in the last 168 hours. No results for input(s): AMMONIA in the last 168 hours. Coagulation Profile: No results for input(s): INR, PROTIME in the last 168 hours. Cardiac Enzymes: No results for input(s): CKTOTAL, CKMB, CKMBINDEX, TROPONINI in the last 168 hours. BNP (last 3 results) No results for input(s): PROBNP in the last 8760 hours. HbA1C: No results for input(s): HGBA1C in the last 72 hours. CBG: No results for input(s): GLUCAP in the last 168 hours. Lipid Profile: No results for input(s): CHOL, HDL, LDLCALC, TRIG, CHOLHDL, LDLDIRECT in the last 72 hours. Thyroid Function Tests: No results for input(s): TSH, T4TOTAL, FREET4, T3FREE, THYROIDAB in the last 72 hours. Anemia Panel: No results for input(s): VITAMINB12, FOLATE, FERRITIN, TIBC, IRON, RETICCTPCT in the last 72 hours. Urine analysis:    Component Value Date/Time   COLORURINE YELLOW 09/17/2018 1602   APPEARANCEUR HAZY (A) 09/17/2018 1602   LABSPEC >1.046 (H) 09/17/2018 1602   PHURINE 6.0 09/17/2018 1602   GLUCOSEU NEGATIVE 09/17/2018 1602   HGBUR  NEGATIVE 09/17/2018 1602   BILIRUBINUR negative 12/17/2018 1552   KETONESUR 5 (A) 09/17/2018 1602   PROTEINUR Negative 12/17/2018 1552   PROTEINUR NEGATIVE 09/17/2018 1602   UROBILINOGEN 0.2 12/17/2018 1552   UROBILINOGEN 0.2 03/03/2015 0510   NITRITE negative 12/17/2018 1552   NITRITE NEGATIVE 09/17/2018 1602   LEUKOCYTESUR Negative 12/17/2018 1552   No results found for this or any previous visit (from the past 240 hour(s)).    Radiology Studies: Mr Foot Right Wo Contrast  Result Date: 12/23/2018 CLINICAL DATA:  Right foot swelling since February 24th. History of gout and pain along the dorsum of the foot. EXAM: MRI OF THE RIGHT FOREFOOT WITHOUT CONTRAST TECHNIQUE: Multiplanar, multisequence MR imaging of the right forefoot was performed. No intravenous contrast was administered. COMPARISON:  None. FINDINGS: Bones/Joint/Cartilage Tiny extra-articular subcortical cysts or erosions of the first metatarsal head with probable reactive edema. No conclusive evidence for acute osteomyelitis though not entirely excluded. 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 Osteoarthritis of the DIP  and PIP joints of the second through fifth digits, interphalangeal joint of the great toe and first MTP are identified with mild hallux valgus. Ligaments Noncontributory Muscles and Tendons No muscle atrophy. The extensor and flexor tendons crossing the forefoot are of normal signal intensity morphology without tenosynovitis, rupture or focal abnormality. Soft tissues Small intraosseous ganglion within the first webspace between the great and second toes measuring 5 x 6 x 5 mm, series 5/13 and series 4/21. Subcutaneous soft tissue edema over the dorsum of the forefoot and medial to the great toe more focally adjacent to the head of the first metatarsal. Findings may represent third spacing or potentially cellulitis. Stigmata of the patient's reported  history of gout might account for the more focal soft tissue swelling and edema adjacent to the first metatarsal head versus secondary to chronic repetitive trauma/pressure changes from the patient's hallux valgus. IMPRESSION: 1. There is generalized subcutaneous soft tissue edema of the included forefoot and more focally adjacent to the medial aspect of the first metatarsal head. Findings are nonspecific but can be seen in third spacing of fluid or cellulitis. The more focal soft tissue thickening adjacent to the first metatarsal head with sub cortical extra-articular cystic change more likely represents stigmata of the patient's history of gout. 2. Nonspecific mild marrow edema of the first metatarsal head and neck without definite soft tissue ulceration or defect is suggest changes of osteomyelitis. This is felt more likely reactive secondary to adjacent the patient's crystalline arthropathy and hallux valgus deformity. 3. Osteoarthritis of the toes.  Hallux valgus of the great toe 4. Small ganglion cyst in the first webspace measuring 5 x 6 x 5 mm. Electronically Signed   By: Ashley Royalty M.D.   On: 12/23/2018 22:35   Dg Toe Great Right  Result Date: 12/23/2018 CLINICAL DATA:  Patient c/o right great toe pain x 2 weeks, NKI. Pain in joint r/o gout vs infection per provider. EXAM: RIGHT GREAT TOE COMPARISON:  None. FINDINGS: No fracture or bone lesion. There is a prominent hallux valgus deformity. Mild bony prominence from the medial margin of the first metatarsal head is consistent with a small bunion. The first metatarsophalangeal joint is well preserved. No subchondral sclerosis, cystic change, marginal osteophytes for marginal erosions. There is prominent soft tissue swelling over the medial aspect of the first metatarsal head. No soft tissue calcification or ossification. IMPRESSION: 1. No fracture or acute finding.  No bone lesion. 2. Prominent hallux valgus deformity with a small bunion from the medial  first metatarsal head. 3. No first metatarsophalangeal joint arthropathic changes, but there is medial soft tissue swelling. Electronically Signed   By: Lajean Manes M.D.   On: 12/23/2018 11:08    Scheduled Meds: . enoxaparin (LOVENOX) injection  40 mg Subcutaneous Q24H  . methylPREDNISolone (SOLU-MEDROL) injection  40 mg Intravenous Daily  . nebivolol  5 mg Oral Daily  . rosuvastatin  10 mg Oral Daily  . saccharomyces boulardii  250 mg Oral BID   Continuous Infusions: . clindamycin (CLEOCIN) IV Stopped (12/24/18 1047)     LOS: 1 day   Time spent: 25 minutes.  Patrecia Pour, MD [facility] [email or web address] Password [email or web address] 12/24/2018, 4:04 PM

## 2018-12-24 NOTE — Consult Note (Signed)
Reason for Consult:painful swollen right foot Referring Physician: hospitalists  [name] is an 68 y.o. female.  HPI: the patient is a 68 year old female who presented to the urgent care at [facility] with a painful swollen right foot.  The provider apparently felt that she had an abscess that might need to be drained.  Unfortunately inserted and keeping her at the urgent care or admitting her to the hospital she ultimately center tomorrow office.  Her physical exam and presentation I felt warm of consistent with gout.  Unfortunately she had an admission for 5 days of IV antibiotics 2 years ago with similar but less significant symptoms.  After discussion I felt that she was going to need IV antibiotic therapy as well as aggressive treatment for potential crystalline disease and in particular gout.  I spoke with the hospitalists and they were willing to admit her with a diagnosis of cellulitis and concern for potential sepsis.  They ultimately admitted her and we are consulted for evaluation of her foot.  Past Medical History:  Diagnosis Date  . Anxiety    takes Xanax prn  . Arthritis   . Diverticulosis   . H/O blood transfusion reaction 2010   hives  . H/O migraine    last 7yrs ago  . Headache(784.0)   . Heart murmur    takes Bystolic nightly  . History of blood clots 1yrs   was on Lovenox injections and Coumadin(was only on that for short period of time)  . History of shingles 3-78yrs ago  . Hx of seasonal allergies    takes Levocetirizine prn  . Hyperlipidemia    takes Crestor nightly  . Hypertension    takes Maxzide daily  . Insomnia    takes Ambien prn  . Joint pain   . Joint swelling   . Trigeminal neuralgia of right side of face 03/26/2016   V2 distribution    Past Surgical History:  Procedure Laterality Date  . ABDOMINAL HYSTERECTOMY    . BREAST BIOPSY  05/03/2006   [place]  . BREAST REDUCTION SURGERY  1987  . CHOLECYSTECTOMY    . COLONOSCOPY    .  ESOPHAGOGASTRODUODENOSCOPY    . EYE SURGERY Right    eye lash removed  . JOINT REPLACEMENT     bilateral hip rt in 2007 and lt in 2010  . NM MYOCAR PERF WALL MOTION  11/04/2008   Normal  . REDUCTION MAMMAPLASTY Bilateral 1988  . REDUCTION MAMMAPLASTY    . SHOULDER ARTHROSCOPY WITH DISTAL CLAVICLE RESECTION Right 09/21/2015   Procedure: RIGHT SHOULDER ARTHROSCOPY WITH DISTAL CLAVICLE EXCISION DEBRIDE LABRAL TEAR ACROMIOPLASTY ;  Surgeon: Frederik Pear, MD;  Location: [facility];  Service: Orthopedics;  Laterality: Right;  . TEE WITHOUT CARDIOVERSION N/A 04/12/2015   Procedure: TRANSESOPHAGEAL ECHOCARDIOGRAM (TEE)/BUBBLE STUDY;  Surgeon: Adrian Prows, MD;  Location: [facility];  Service: Cardiovascular;  Laterality: N/A;  . TOTAL HIP REVISION  04/14/2012   Procedure: TOTAL HIP REVISION;  Surgeon: Kerin Salen, MD;  Location: [facility];  Service: Orthopedics;  Laterality: Right;  right total hip revision  . TOTAL HIP REVISION Left 11/30/2013   Procedure: TOTAL HIP REVISION;  Surgeon: Kerin Salen, MD;  Location: [facility];  Service: Orthopedics;  Laterality: Left;    Family History  Problem Relation Age of Onset  . Heart attack Mother   . Alzheimer's disease Mother   . Diabetes Sister   . Hypertension Sister     Social History:  reports  that she is a non-smoker but has been exposed to tobacco smoke. She has been exposed to 0.25 packs per day for the past 42.00 years. She has never used smokeless tobacco. She reports previous alcohol use. She reports that she does not use drugs.  Allergies:  Allergies  Allergen Reactions  . Ibuprofen Hives and Swelling  . Penicillins Hives and Swelling    Has patient had a PCN reaction causing immediate rash, facial/tongue/throat swelling, SOB or lightheadedness with hypotension: Yes Has patient had a PCN reaction causing severe rash involving mucus membranes or skin necrosis: No Has patient had a PCN reaction that required hospitalization: No Has  patient had a PCN reaction occurring within the last 10 years: No If all of the above answers are "NO", then may proceed with Cephalosporin use.     Medications: I have reviewed the patient's current medications.  Results for orders placed or performed during the hospital encounter of 12/23/18 (from the past 48 hour(s))  HIV antibody (Routine Testing)     Status: None   Collection Time: 12/23/18  6:40 PM  Result Value Ref Range   HIV Screen 4th Generation wRfx Non Reactive Non Reactive    Comment: (NOTE) Performed At: [facility] [place], [place] [id number] Rush Farmer MD [id number]   CBC     Status: Abnormal   Collection Time: 12/23/18  6:40 PM  Result Value Ref Range   WBC 5.7 4.0 - 10.5 K/uL   RBC 4.49 3.87 - 5.11 MIL/uL   Hemoglobin 13.1 12.0 - 15.0 g/dL   HCT 44.0 36.0 - 46.0 %   MCV 98.0 80.0 - 100.0 fL   MCH 29.2 26.0 - 34.0 pg   MCHC 29.8 (L) 30.0 - 36.0 g/dL   RDW 15.3 11.5 - 15.5 %   Platelets 263 150 - 400 K/uL   nRBC 0.0 0.0 - 0.2 %    Comment: Performed at [facility], [place] [address], [place], [place] [address]  Comprehensive metabolic panel     Status: None   Collection Time: 12/23/18  6:40 PM  Result Value Ref Range   Sodium 142 135 - 145 mmol/L   Potassium 3.7 3.5 - 5.1 mmol/L   Chloride 106 98 - 111 mmol/L   CO2 26 22 - 32 mmol/L   Glucose, Bld 96 70 - 99 mg/dL   BUN 18 8 - 23 mg/dL   Creatinine, Ser 0.66 0.44 - 1.00 mg/dL   Calcium 9.6 8.9 - 10.3 mg/dL   Total Protein 7.6 6.5 - 8.1 g/dL   Albumin 4.0 3.5 - 5.0 g/dL   AST 18 15 - 41 U/L   ALT 16 0 - 44 U/L   Alkaline Phosphatase 70 38 - 126 U/L   Total Bilirubin 0.3 0.3 - 1.2 mg/dL   GFR calc non Af Amer >60 >60 mL/min   GFR calc Af Amer >60 >60 mL/min   Anion gap 10 5 - 15    Comment: Performed at [facility], [place] [address] [place], [place] [address]  Sedimentation rate     Status: Abnormal   Collection Time: 12/23/18  6:40  PM  Result Value Ref Range   Sed Rate 50 (H) 0 - 22 mm/hr    Comment: Performed at [facility], [place] [address], [place], [place] [address]  Basic metabolic panel     Status: Abnormal   Collection Time: 12/24/18  5:21 AM  Result Value Ref Range  Sodium 137 135 - 145 mmol/L   Potassium 4.2 3.5 - 5.1 mmol/L   Chloride 106 98 - 111 mmol/L   CO2 23 22 - 32 mmol/L   Glucose, Bld 143 (H) 70 - 99 mg/dL   BUN 22 8 - 23 mg/dL   Creatinine, Ser 0.62 0.44 - 1.00 mg/dL   Calcium 9.3 8.9 - 10.3 mg/dL   GFR calc non Af Amer >60 >60 mL/min   GFR calc Af Amer >60 >60 mL/min   Anion gap 8 5 - 15    Comment: Performed at [facility], [place] [address], [place], [place] [address]  CBC     Status: None   Collection Time: 12/24/18  5:21 AM  Result Value Ref Range   WBC 4.9 4.0 - 10.5 K/uL   RBC 4.09 3.87 - 5.11 MIL/uL   Hemoglobin 12.1 12.0 - 15.0 g/dL   HCT 39.2 36.0 - 46.0 %   MCV 95.8 80.0 - 100.0 fL   MCH 29.6 26.0 - 34.0 pg   MCHC 30.9 30.0 - 36.0 g/dL   RDW 14.8 11.5 - 15.5 %   Platelets 280 150 - 400 K/uL   nRBC 0.0 0.0 - 0.2 %    Comment: Performed at [facility], [place] [address] [place], [place] [address]    Mr Foot Right Wo Contrast  Result Date: 12/23/2018 CLINICAL DATA:  Right foot swelling since February 24th. History of gout and pain along the dorsum of the foot. EXAM: MRI OF THE RIGHT FOREFOOT WITHOUT CONTRAST TECHNIQUE: Multiplanar, multisequence MR imaging of the right forefoot was performed. No intravenous contrast was administered. COMPARISON:  None. FINDINGS: Bones/Joint/Cartilage Tiny extra-articular subcortical cysts or erosions of the first metatarsal head with probable reactive edema. No conclusive evidence for acute osteomyelitis though not entirely excluded. 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 Ligaments Noncontributory Muscles and Tendons No muscle atrophy. The extensor and flexor tendons crossing the forefoot are of normal signal intensity morphology without tenosynovitis, rupture or focal abnormality. Soft tissues Small intraosseous ganglion within the first webspace between the great and second toes measuring 5 x 6 x 5 mm, series 5/13 and series 4/21. Subcutaneous soft tissue edema over the dorsum of the forefoot and medial to the great toe more focally adjacent to the head of the first metatarsal. Findings may represent third spacing or potentially cellulitis. Stigmata of the patient's reported history of gout might account for the more focal soft tissue swelling and edema adjacent to the first metatarsal head versus secondary to chronic repetitive trauma/pressure changes from the patient's hallux valgus. IMPRESSION: 1. There is generalized subcutaneous soft tissue edema of the included forefoot and more focally adjacent to the medial aspect of the first metatarsal head. Findings are nonspecific but can be seen in third spacing of fluid or cellulitis. The more focal soft tissue thickening adjacent to the first metatarsal head with sub cortical extra-articular cystic change more likely represents stigmata of the patient's history of gout. 2. Nonspecific mild marrow edema of the first metatarsal head and neck without definite soft tissue ulceration or defect is suggest changes of osteomyelitis. This is felt more likely reactive secondary to adjacent the patient's crystalline arthropathy and hallux valgus deformity. 3. Osteoarthritis of the toes.  Hallux valgus of  the great toe 4. Small ganglion cyst in the first webspace measuring 5 x 6 x 5 mm. Electronically Signed   By: Ashley Royalty M.D.   On: 12/23/2018 22:35   Dg Toe Great Right  Result Date: 12/23/2018 CLINICAL DATA:  Patient  c/o right great toe pain x 2 weeks, NKI. Pain in joint r/o gout vs infection per provider. EXAM: RIGHT GREAT TOE COMPARISON:  None. FINDINGS: No fracture or bone lesion. There is a prominent hallux valgus deformity. Mild bony prominence from the medial margin of the first metatarsal head is consistent with a small bunion. The first metatarsophalangeal joint is well preserved. No subchondral sclerosis, cystic change, marginal osteophytes for marginal erosions. There is prominent soft tissue swelling over the medial aspect of the first metatarsal head. No soft tissue calcification or ossification. IMPRESSION: 1. No fracture or acute finding.  No bone lesion. 2. Prominent hallux valgus deformity with a small bunion from the medial first metatarsal head. 3. No first metatarsophalangeal joint arthropathic changes, but there is medial soft tissue swelling. Electronically Signed   By: Lajean Manes M.D.   On: 12/23/2018 11:08    ROS  ROS: I have reviewed the patient's review of systems thoroughly and there are no positive responses as relates to the HPI. Blood pressure 112/65, pulse (!) 59, temperature 98.3 F (36.8 C), resp. rate 15, height 5' (1.524 m), weight 68.5 kg, SpO2 95 %. Physical Exam Well-developed well-nourished patient in no acute distress. Alert and oriented x3 HEENT:within normal limits Cardiac: Regular rate and rhythm Pulmonary: Lungs clear to auscultation Abdomen: Soft and nontender.  Normal active bowel sounds  Musculoskeletal: (right foot: Mild soft tissue swelling.  No erythema.  Markedly decreased light touch sensation.  Markedly decreased soft tissue swelling. Assessment/Plan: 68 year old female with right foot swelling and pain with a history of very consistent with gout.  She presented to my office with significant soft tissue swelling and pain and concern for infection.  She was ultimately admitted for evaluation and potential treatment for both infection and gout.//She has had  dramatic resolution of her symptoms with one day of IV antibiotics and Solu-Medrol.  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  My historical sense is that most people with cellulitis get worse for a day or 2 prior to getting better.  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  MRI is consistent with that as well.  At this point I feel that she is certainly safe for discharge.  I will certainly be happy to see her in the office in one week.  I am available for any further discussion on this patient.  Alta Corning 12/24/2018, 8:29 PM  [phone]

## 2018-12-24 NOTE — Progress Notes (Signed)
PT Cancellation Note  Patient Details Name: [name] MRN: [id number] DOB: 1951/05/27   Cancelled Treatment:    Reason Eval/Treat Not Completed: PT screened, no needs identified, will sign off, patient independent, able to tolerate /WB on right foot now.    Tresa Endo Elizabeth/ 12/24/2018, 3:09 PM  Tresa Endo PT Acute Rehabilitation Services Pager [phone] Office [phone]

## 2018-12-25 MED ORDER — COLCHICINE 0.6 MG PO TABS: 0.6000 mg | ORAL_TABLET | Freq: Every day | ORAL | 0 refills | Status: DC

## 2018-12-25 MED ORDER — CLINDAMYCIN HCL 300 MG PO CAPS: 300.0000 mg | ORAL_CAPSULE | Freq: Three times a day (TID) | ORAL | 0 refills | Status: AC

## 2018-12-25 MED ORDER — PREDNISONE 20 MG PO TABS: ORAL_TABLET | ORAL | 0 refills | Status: DC

## 2018-12-25 MED ORDER — TRAMADOL HCL 50 MG PO TABS: 50.0000 mg | ORAL_TABLET | Freq: Four times a day (QID) | ORAL | 0 refills | Status: AC | PRN

## 2018-12-25 MED ORDER — SACCHAROMYCES BOULARDII 250 MG PO CAPS: 250.0000 mg | ORAL_CAPSULE | Freq: Two times a day (BID) | ORAL | 0 refills | Status: DC

## 2018-12-25 NOTE — Discharge Summary (Signed)
Physician Discharge Summary  [name] VFI:[id number] DOB: 1951/02/22 DOA: 12/23/2018  PCP: Glendale Chard, MD  Admit date: 12/23/2018 Discharge date: 12/25/2018  Admitted From: Home Disposition: Home by way of orthopedics office   Recommendations for Outpatient Follow-up:  1. Follow up with PCP in 1-2 weeks for ongoing management of gout, prediabetes, and alcoholism currently in recovery. 2. Follow up with orthopedics, Dr. Berenice Primas.  Home Health: None Equipment/Devices: None Discharge Condition: Stable CODE STATUS: Full Diet recommendation: Heart healthy  Brief/Interim Summary: [name] is a 68 y.o. 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 She reports a week of gradually worsening pain in the right first toe and foot becoming severe, limiting weight bearing. She had an intraarticular steroid injection without significant improvement, later on 3/3 presented to UC initially where discoloration and pain over the 1st MTP was felt to be either infection or tophus. She was referred urgently to orthopedics, Dr. Berenice Primas, who also felt that the distinction couldn't be reliably made based on exam alone and referred her to the ED. She was afebrile with elevated ESR no leukocytosis, and referred for admission with MRI recommended. Antibiotics and steroids were started. MRI showed inflammatory changes that are nonspecific. The patient showed significant improvement with these empiric treatments. These will be continued with close follow up by PCP.  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 No  drainable abscess on MRI. However, clinical picture and imaging is equivocal and risk of adverse outcome is unacceptably high if infection is not treated. - Continue clindamycin empirically. Also on probiotic. - Give colchicine 0.84m daily while symptomatic. Hx hives w/ibuprofen. - Prescribe taper of prednisone x11 days to avoid rebound with withdrawal.  - Discussed with Dr. GBerenice Primaswho believes gout is the more likely possibility. Would expect delay in clinical improvement if solely due to cellulitis, but clinical picture remains with atypical features of both solely cellulitis and solely gout. - Hydrocodone last analgesic prescribed in July 2019. Otherwise, aLorrin Maishas been regularly prescribed. Feel risk of misuse and diversion is not insignificant, but is balanced out by benefit of pain control with clear acute indication. Tramadol prescribed for 5 days as below.   Alcohol abuse: In recovery [place]  - Avoid benzodiazepines and other psychotropic medications as much as possible - Encouraged to continue meeting attendance and to establish a sponsor.   HTN:  - Continue bystolic  HLD:  - Continue statin.  Prediabetes: HbA1c 6.1%. - Follow up with PCP.   Discharge Instructions Discharge Instructions    Diet - low sodium heart healthy   Complete by:  As directed    Discharge instructions   Complete by:  As directed    You were admitted for work up of foot pain which is likely due to an acute gout flare, though a skin and soft tissue infection is not completely ruled out. Fortunately you have improved and are stable for discharge on treatment for gout and cellulitis.  - Continue taking prednisone, taper as directed over  the next 10 days. You should also take colchicine 0.88m tab once daily until your symptoms are gone. - Continue taking clindamycin, an antibiotic, for 7 days. Due to risk of diarrhea from clindamycin, it is  recommended that you take a probiotic for 14 days. Florastor has been sent to your pharmacy.  - It is very important to follow up with Dr. GBerenice Primasand your PCP for ongoing evaluation or seek medical attention right away if you develop redness tracking up the leg or fever or worsening pain.   Increase activity slowly   Complete by:  As directed      Allergies as of 12/25/2018      Reactions   Ibuprofen Hives, Swelling   Penicillins Hives, Swelling   Has patient had a PCN reaction causing immediate rash, facial/tongue/throat swelling, SOB or lightheadedness with hypotension: Yes Has patient had a PCN reaction causing severe rash involving mucus membranes or skin necrosis: No Has patient had a PCN reaction that required hospitalization: No Has patient had a PCN reaction occurring within the last 10 years: No If all of the above answers are "NO", then may proceed with Cephalosporin use.      Medication List    TAKE these medications   acetaminophen 500 MG tablet Commonly known as:  TYLENOL Take 1,000 mg by mouth daily as needed for mild pain.   BENADRYL ITCH RELIEF EX Apply 1 application topically 3 (three) times daily as needed (itching).   Bystolic 5 MG tablet Generic drug:  nebivolol TAKE 1 TABLET DAILY   cholecalciferol 1000 units tablet Commonly known as:  VITAMIN D Take 1,000 Units by mouth daily.   clindamycin 300 MG capsule Commonly known as:  CLEOCIN Take 1 capsule (300 mg total) by mouth 3 (three) times daily for 7 days.   colchicine 0.6 MG tablet Take 1 tablet (0.6 mg total) by mouth daily for 10 days.   diphenhydrAMINE 25 MG tablet Commonly known as:  BENADRYL Take 25 mg by mouth at bedtime as needed for itching.   EYE DROPS OP Apply 1 drop to eye 2 (two) times daily as needed (dry eyes).   predniSONE 20 MG tablet Commonly known as:  DELTASONE Take 456mx 3 days, 2076m 3 days, 11m44m4 days   rosuvastatin 10 MG tablet Commonly known as:  CRESTOR TAKE 1  TABLET DAILY   saccharomyces boulardii 250 MG capsule Commonly known as:  FLORASTOR Take 1 capsule (250 mg total) by mouth 2 (two) times daily.   traMADol 50 MG tablet Commonly known as:  ULTRAM Take 1 tablet (50 mg total) by mouth every 6 (six) hours as needed for up to 5 days for moderate pain.   triamterene-hydrochlorothiazide 37.5-25 MG capsule Commonly known as:  DYAZIDE Take 1 capsule by mouth every morning.   zolpidem 5 MG tablet Commonly known as:  AMBIEN Take 5 mg by mouth at bedtime as needed for sleep.      Follow-up Information    SandGlendale Chard. Schedule an appointment as soon as possible for a visit in 1 week(s).   Specialty:  Internal Medicine Contact information: [id number] [phone]        GravDorna Leitz. Schedule an appointment as soon as possible for a visit in 2 week(s).   Specialty:  Orthopedic Surgery Contact information: [phone]      Allergies  Allergen Reactions  .  Ibuprofen Hives and Swelling  . Penicillins Hives and Swelling    Has patient had a PCN reaction causing immediate rash, facial/tongue/throat swelling, SOB or lightheadedness with hypotension: Yes Has patient had a PCN reaction causing severe rash involving mucus membranes or skin necrosis: No Has patient had a PCN reaction that required hospitalization: No Has patient had a PCN reaction occurring within the last 10 years: No If all of the above answers are "NO", then may proceed with Cephalosporin use.     Consultations:  Orthopedics  Procedures/Studies: Mr Foot Right Wo Contrast  Result Date: 12/23/2018 CLINICAL DATA:  Right foot swelling since February 24th. History of gout and pain along the dorsum of the foot. EXAM: MRI OF THE RIGHT FOREFOOT WITHOUT CONTRAST TECHNIQUE: Multiplanar, multisequence MR imaging of the right forefoot was performed. No intravenous contrast was  administered. COMPARISON:  None. FINDINGS: Bones/Joint/Cartilage Tiny extra-articular subcortical cysts or erosions of the first metatarsal head with probable reactive edema. No conclusive evidence for acute osteomyelitis though not entirely excluded. 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 Ligaments Noncontributory Muscles and Tendons No muscle atrophy. The extensor and flexor tendons crossing the forefoot are of normal signal intensity morphology without tenosynovitis, rupture or focal abnormality. Soft tissues Small intraosseous ganglion within the first webspace between the great and second toes measuring 5 x 6 x 5 mm, series 5/13 and series 4/21. Subcutaneous soft tissue edema over the dorsum of the forefoot and medial to the great toe more focally adjacent to the head of the first metatarsal. Findings may represent third spacing or potentially cellulitis. Stigmata of the patient's reported history of gout might account for the more focal soft tissue swelling and edema adjacent to the first metatarsal head versus secondary to chronic repetitive trauma/pressure changes from the patient's hallux valgus. IMPRESSION: 1. There is generalized subcutaneous soft tissue edema of the included forefoot and more focally adjacent to the medial aspect of the first metatarsal head. Findings are nonspecific but can be seen in third spacing of fluid or cellulitis. The more focal soft tissue thickening adjacent to the first metatarsal head with sub cortical extra-articular cystic change more likely represents stigmata of the patient's history of gout. 2. Nonspecific mild marrow edema of the first metatarsal head and neck without definite soft tissue ulceration or defect is suggest  changes of osteomyelitis. This is felt more likely reactive secondary to adjacent the patient's crystalline arthropathy and hallux valgus deformity. 3. Osteoarthritis of the toes.  Hallux valgus of the great toe 4. Small ganglion cyst in the first webspace measuring 5 x 6 x 5 mm. Electronically Signed   By: Ashley Royalty M.D.   On: 12/23/2018 22:35   Dg Toe Great Right  Result Date: 12/23/2018 CLINICAL DATA:  Patient c/o right great toe pain x 2 weeks, NKI. Pain in joint r/o gout vs infection per provider. EXAM: RIGHT GREAT TOE COMPARISON:  None. FINDINGS: No fracture or bone lesion. There is a prominent hallux valgus deformity. Mild bony prominence from the medial margin of the first metatarsal head is consistent with a small bunion. The first metatarsophalangeal joint is well preserved. No subchondral sclerosis, cystic change, marginal osteophytes for marginal erosions. There is prominent soft tissue swelling over the medial aspect of the first metatarsal head. No soft tissue  calcification or ossification. IMPRESSION: 1. No fracture or acute finding.  No bone lesion. 2. Prominent hallux valgus deformity with a small bunion from the medial first metatarsal head. 3. No first metatarsophalangeal joint arthropathic changes, but there is medial soft tissue swelling. Electronically Signed   By: Lajean Manes M.D.   On: 12/23/2018 11:08     Subjective: Pain is significantly improved from admission, somewhat worse this morning, but swelling in the whole foot and localized pain in toes is overall improved. Able to bear weight.   Discharge Exam: Vitals:   12/24/18 2200 12/25/18 0516  BP: 110/67 117/61  Pulse: (!) 57 (!) 56  Resp: 16 16  Temp: 98.1 F (36.7 C) 97.9 F (36.6 C)  SpO2: 94% 94%   General: Pt is alert, awake, not in acute distress Cardiovascular: RRR, S1/S2 +, no rubs, no gallops Respiratory: CTA bilaterally, no wheezing, no rhonchi Abdominal: Soft, NT, ND, bowel sounds + Extremities:  Right foot diffusely very mildly edematous (significant improvement from previous exam) with hyperpigmentation dorsally on 1st MTP. Laterally there is focal swelling consistent with tophus with no fluctuance (limited exam due to pain) overlying bunion deformity. No proximally tracking erythema.   Labs: BNP (last 3 results) No results for input(s): BNP in the last 8760 hours. Basic Metabolic Panel: Recent Labs  Lab 12/23/18 1840 12/24/18 0521  NA 142 137  K 3.7 4.2  CL 106 106  CO2 26 23  GLUCOSE 96 143*  BUN 18 22  CREATININE 0.66 0.62  CALCIUM 9.6 9.3   Liver Function Tests: Recent Labs  Lab 12/23/18 1840  AST 18  ALT 16  ALKPHOS 70  BILITOT 0.3  PROT 7.6  ALBUMIN 4.0   No results for input(s): LIPASE, AMYLASE in the last 168 hours. No results for input(s): AMMONIA in the last 168 hours. CBC: Recent Labs  Lab 12/23/18 1840 12/24/18 0521  WBC 5.7 4.9  HGB 13.1 12.1  HCT 44.0 39.2  MCV 98.0 95.8  PLT 263 280   Cardiac Enzymes: No results for input(s): CKTOTAL, CKMB, CKMBINDEX, TROPONINI in the last 168 hours. BNP: Invalid input(s): POCBNP CBG: No results for input(s): GLUCAP in the last 168 hours. D-Dimer No results for input(s): DDIMER in the last 72 hours. Hgb A1c No results for input(s): HGBA1C in the last 72 hours. Lipid Profile No results for input(s): CHOL, HDL, LDLCALC, TRIG, CHOLHDL, LDLDIRECT in the last 72 hours. Thyroid function studies No results for input(s): TSH, T4TOTAL, T3FREE, THYROIDAB in the last 72 hours.  Invalid input(s): FREET3 Anemia work up No results for input(s): VITAMINB12, FOLATE, FERRITIN, TIBC, IRON, RETICCTPCT in the last 72 hours. Urinalysis    Component Value Date/Time   COLORURINE YELLOW 09/17/2018 1602   APPEARANCEUR HAZY (A) 09/17/2018 1602   LABSPEC >1.046 (H) 09/17/2018 1602   PHURINE 6.0 09/17/2018 1602   GLUCOSEU NEGATIVE 09/17/2018 1602   HGBUR NEGATIVE 09/17/2018 1602   BILIRUBINUR negative 12/17/2018 1552    KETONESUR 5 (A) 09/17/2018 1602   PROTEINUR Negative 12/17/2018 1552   PROTEINUR NEGATIVE 09/17/2018 1602   UROBILINOGEN 0.2 12/17/2018 1552   UROBILINOGEN 0.2 03/03/2015 0510   NITRITE negative 12/17/2018 1552   NITRITE NEGATIVE 09/17/2018 1602   LEUKOCYTESUR Negative 12/17/2018 1552    Microbiology No results found for this or any previous visit (from the past 240 hour(s)).  Time coordinating discharge: Approximately 40 minutes  Patrecia Pour, MD  [facility] 12/25/2018, [place] PM Pager [phone]

## 2018-12-29 ENCOUNTER — Other Ambulatory Visit: Payer: Self-pay | Admitting: Internal Medicine

## 2018-12-29 DIAGNOSIS — Z1231 Encounter for screening mammogram for malignant neoplasm of breast: Secondary | ICD-10-CM

## 2019-01-02 ENCOUNTER — Other Ambulatory Visit: Payer: Self-pay

## 2019-01-02 ENCOUNTER — Ambulatory Visit
Admission: RE | Admit: 2019-01-02 | Discharge: 2019-01-02 | Disposition: A | Discharge status: Home / Self Care | Payer: Medicare Other | Source: Ambulatory Visit | Attending: Internal Medicine | Admitting: Internal Medicine

## 2019-01-02 DIAGNOSIS — Z1231 Encounter for screening mammogram for malignant neoplasm of breast: Secondary | ICD-10-CM | POA: Diagnosis not present

## 2019-01-05 ENCOUNTER — Telehealth: Payer: Self-pay

## 2019-01-05 ENCOUNTER — Other Ambulatory Visit: Payer: Self-pay | Admitting: Internal Medicine

## 2019-01-05 MED ORDER — TRAMADOL HCL 50 MG PO TABS: 50.0000 mg | ORAL_TABLET | Freq: Two times a day (BID) | ORAL | 0 refills | Status: DC | PRN

## 2019-01-05 NOTE — Telephone Encounter (Signed)
Transition Care Management Follow-up Telephone Call  Date of discharge and from where: 12/26/2018  How have you been since you were released from the hospital? [place] questions or concerns? YES THE PT WANTS TO KNOW IF SHE CAN GET A REFILL ON HER TRAMADOL WHILE SHE WAITS TO COME IN FOR AN APPT.  THE PT SAID THAT HER RIGHT FOOT IS IN PAIN ALL THE TIME.  Items Reviewed:  Did the pt receive and understand the discharge instructions provided? YES   Medications obtained and verified? YES   Any new allergies since your discharge? NO  Dietary orders reviewed?NO   Do you have support at home? YES  Other (ie: DME, Home Health, etc) NO  Functional Questionnaire: (I = Independent and D = Dependent) ADL's: D  Bathing/Dressing- D   Meal Prep- D  Eating- D  Maintaining continence- D  Transferring/Ambulation- D  Managing Meds- D   Follow up appointments reviewed:    PCP Hospital f/u appt confirmed? 01/12/2019  Scheduled to see Minette Brine, NP [facility] f/u appt confirmed? n/a  Are transportation arrangements needed? NO  If their condition worsens, is the pt aware to call  their PCP or go to the ED? YES  Was the patient provided with contact information for the PCP's office or ED?yes Was the pt encouraged to call back with questions or concerns?yes

## 2019-01-12 ENCOUNTER — Encounter: Payer: Self-pay | Admitting: Nurse Practitioner

## 2019-01-12 ENCOUNTER — Other Ambulatory Visit: Payer: Self-pay

## 2019-01-12 ENCOUNTER — Ambulatory Visit (INDEPENDENT_AMBULATORY_CARE_PROVIDER_SITE_OTHER): Payer: Medicare Other | Admitting: Nurse Practitioner

## 2019-01-12 VITALS — BP 110/62 | HR 55 | Temp 98.1°F | Ht 60.6 in | Wt 154.0 lb

## 2019-01-12 DIAGNOSIS — M79674 Pain in right toe(s): Secondary | ICD-10-CM

## 2019-01-12 DIAGNOSIS — Z09 Encounter for follow-up examination after completed treatment for conditions other than malignant neoplasm: Secondary | ICD-10-CM

## 2019-01-12 DIAGNOSIS — M21611 Bunion of right foot: Secondary | ICD-10-CM | POA: Diagnosis not present

## 2019-01-12 NOTE — Progress Notes (Signed)
Subjective:     Patient ID: [name] , female    DOB: 1951/08/15 , 68 y.o.   MRN: [id number]   Chief Complaint  Patient presents with  . Hospitalization Follow-up    gout and infection in foot    HPI  Here for hospital follow up from 3/3-3/5 for possible right great toe infection was treated with clindamycin and prednisone and colchicine.  Continues to have right foot pain and taking Tramadol.  No alcohol since January 27th.  Staying well hydrated    Past Medical History:  Diagnosis Date  . Anxiety    takes Xanax prn  . Arthritis   . Diverticulosis   . H/O blood transfusion reaction 2010   hives  . H/O migraine    last 59yrs ago  . Headache(784.0)   . Heart murmur    takes Bystolic nightly  . History of blood clots 83yrs   was on Lovenox injections and Coumadin(was only on that for short period of time)  . History of shingles 3-28yrs ago  . Hx of seasonal allergies    takes Levocetirizine prn  . Hyperlipidemia    takes Crestor nightly  . Hypertension    takes Maxzide daily  . Insomnia    takes Ambien prn  . Joint pain   . Joint swelling   . Trigeminal neuralgia of right side of face 03/26/2016   V2 distribution     Family History  Problem Relation Age of Onset  . Heart attack Mother   . Alzheimer's disease Mother   . Diabetes Sister   . Hypertension Sister      Current Outpatient Medications:  .  acetaminophen (TYLENOL) 500 MG tablet, Take 1,000 mg by mouth daily as needed for mild pain., Disp: , Rfl:  .  BYSTOLIC 5 MG tablet, TAKE 1 TABLET DAILY, Disp: 90 tablet, Rfl: 2 .  Carboxymethylcellulose Sodium (EYE DROPS OP), Apply 1 drop to eye 2 (two) times daily as needed (dry eyes)., Disp: , Rfl:  .  cholecalciferol (VITAMIN D) 1000 UNITS tablet, Take 1,000 Units by mouth daily. , Disp: , Rfl:  .  diphenhydrAMINE (BENADRYL) 25 MG tablet, Take 25 mg by mouth at bedtime as needed for itching., Disp: , Rfl:  .  diphenhydrAMINE-Zinc Acetate (BENADRYL ITCH  RELIEF EX), Apply 1 application topically 3 (three) times daily as needed (itching)., Disp: , Rfl:  .  rosuvastatin (CRESTOR) 10 MG tablet, TAKE 1 TABLET DAILY, Disp: 90 tablet, Rfl: 1 .  traMADol (ULTRAM) 50 MG tablet, Take 1 tablet (50 mg total) by mouth 2 (two) times daily as needed., Disp: 30 tablet, Rfl: 0 .  triamterene-hydrochlorothiazide (DYAZIDE) 37.5-25 MG capsule, Take 1 capsule by mouth every morning., Disp: , Rfl:  .  zolpidem (AMBIEN) 5 MG tablet, Take 5 mg by mouth at bedtime as needed for sleep. , Disp: , Rfl:  .  colchicine 0.6 MG tablet, Take 1 tablet (0.6 mg total) by mouth daily for 10 days., Disp: 10 tablet, Rfl: 0 .  predniSONE (DELTASONE) 20 MG tablet, Take 40mg  x 3 days, 20mg  x 3 days, 10mg  x 4 days (Patient not taking: Reported on 01/12/2019), Disp: 11 tablet, Rfl: 0 .  saccharomyces boulardii (FLORASTOR) 250 MG capsule, Take 1 capsule (250 mg total) by mouth 2 (two) times daily. (Patient not taking: Reported on 01/12/2019), Disp: 28 capsule, Rfl: 0   Allergies  Allergen Reactions  . Ibuprofen Hives and Swelling  . Penicillins Hives and Swelling  Has patient had a PCN reaction causing immediate rash, facial/tongue/throat swelling, SOB or lightheadedness with hypotension: Yes Has patient had a PCN reaction causing severe rash involving mucus membranes or skin necrosis: No Has patient had a PCN reaction that required hospitalization: No Has patient had a PCN reaction occurring within the last 10 years: No If all of the above answers are "NO", then may proceed with Cephalosporin use.      Review of Systems  Constitutional: Negative for fatigue.  Respiratory: Negative for cough.   Cardiovascular: Negative.   Musculoskeletal:       Right great toe pain  Skin: Negative.      Today's Vitals   01/12/19 1209  BP: 110/62  Pulse: (!) 55  Temp: 98.1 F (36.7 C)  TempSrc: Oral  SpO2: 96%  Weight: 154 lb (69.9 kg)  Height: 5' 0.6" (1.539 m)   Body mass index is  29.48 kg/m.   Objective:  Physical Exam Constitutional:      Appearance: Normal appearance.  Musculoskeletal:        General: Swelling and tenderness (right great toe swelling lateral ) present.     Comments: Right lateral great toe has a bunion present and swollen area, mild erythema present as well.   Skin:    General: Skin is warm.  Neurological:     General: No focal deficit present.     Mental Status: She is alert.  Psychiatric:        Mood and Affect: Mood normal.        Behavior: Behavior normal.        Thought Content: Thought content normal.        Judgment: Judgment normal.         Assessment And Plan:     1. Great toe pain, right  Medial great toe with tenderness, erythema and swelling  She was admitted from 12/23/2018-12/25/2018 for possible toe infection, treated with clindamycin and another round of prednisone. She is also taking tramadol as needed at least twice a day  Will refer to podiatry for further evaluation - Ambulatory referral to Podiatry  2. Bunion of great toe of right foot  Right great toe  Will refer to podiatry for further evaluation - Ambulatory referral to [place]

## 2019-02-10 ENCOUNTER — Encounter: Payer: Self-pay | Admitting: Sports Medicine

## 2019-02-10 ENCOUNTER — Other Ambulatory Visit: Payer: Self-pay

## 2019-02-10 ENCOUNTER — Ambulatory Visit (INDEPENDENT_AMBULATORY_CARE_PROVIDER_SITE_OTHER): Payer: Medicare Other | Admitting: Sports Medicine

## 2019-02-10 VITALS — BP 111/68 | HR 87 | Temp 97.5°F

## 2019-02-10 DIAGNOSIS — M1000 Idiopathic gout, unspecified site: Secondary | ICD-10-CM | POA: Diagnosis not present

## 2019-02-10 DIAGNOSIS — M779 Enthesopathy, unspecified: Secondary | ICD-10-CM

## 2019-02-10 DIAGNOSIS — M25571 Pain in right ankle and joints of right foot: Secondary | ICD-10-CM

## 2019-02-10 DIAGNOSIS — M6740 Ganglion, unspecified site: Secondary | ICD-10-CM | POA: Diagnosis not present

## 2019-02-10 DIAGNOSIS — M2540 Effusion, unspecified joint: Secondary | ICD-10-CM

## 2019-02-10 DIAGNOSIS — M06071 Rheumatoid arthritis without rheumatoid factor, right ankle and foot: Secondary | ICD-10-CM

## 2019-02-10 DIAGNOSIS — M79671 Pain in right foot: Secondary | ICD-10-CM | POA: Diagnosis not present

## 2019-02-10 MED ORDER — TRAMADOL HCL 50 MG PO TABS: 50.0000 mg | ORAL_TABLET | Freq: Four times a day (QID) | ORAL | 0 refills | Status: AC | PRN

## 2019-02-10 MED ORDER — TRIAMCINOLONE ACETONIDE 10 MG/ML IJ SUSP
10.0000 mg | Freq: Once | INTRAMUSCULAR | Status: AC
  Administered 2019-02-10: 10 mg

## 2019-02-10 NOTE — Progress Notes (Signed)
Subjective: [name] is a 67 y.o. female patient who presents to office for evaluation of Right foot pain. Patient complains of progressive pain especially over the last month. Admits to warmth, redness, and swelling to the area that is unrelieved at right big toe joint, was admitted at [facility] on 12/23/18 and had MRI, Xray, and blood work was treated for infection vs gout, was given clindamycin, tramadol, and prednisone. The swelling is gone down but pain is still present across the joint. Admits to previous gouty attack/family history/kidney disease/change in diet. Patient denies any other pedal complaints.   Review of Systems  Musculoskeletal: Positive for joint pain and myalgias.  All other systems reviewed and are negative.    Patient Active Problem List   Diagnosis Date Noted  . Bunion of great toe of right foot 01/12/2019  . Cellulitis 12/23/2018  . Foot infection   . Great toe pain, right 12/17/2018  . Prediabetes 12/17/2018  . Cellulitis of foot 08/23/2017  . Hypokalemia 08/23/2017  . Trigeminal neuralgia of right side of face 03/26/2016  . Hyperlipidemia 12/04/2013  . Hypertension 12/04/2013  . Constipation 12/04/2013  . Insomnia 12/04/2013  . Acute blood loss anemia 12/04/2013  . Left hip pain 12/04/2013  . 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 acetaminophen (TYLENOL) 500 MG tablet Take 1,000 mg by mouth daily as needed for mild pain.    Marland Kitchen BYSTOLIC 5 MG tablet TAKE 1 TABLET DAILY 90 tablet 2  . Carboxymethylcellulose Sodium (EYE DROPS OP) Apply 1 drop to eye 2 (two) times daily as needed (dry eyes).    . cholecalciferol (VITAMIN D) 1000 UNITS tablet Take 1,000 Units by mouth daily.     . colchicine 0.6 MG tablet Take 1 tablet (0.6 mg total) by mouth daily for 10 days. 10 tablet 0  . diphenhydrAMINE (BENADRYL) 25 MG  tablet Take 25 mg by mouth at bedtime as needed for itching.    . diphenhydrAMINE-Zinc Acetate (BENADRYL ITCH RELIEF EX) Apply 1 application topically 3 (three) times daily as needed (itching).    . predniSONE (DELTASONE) 20 MG tablet Take 11m x 3 days, 267mx 3 days, 1032m 4 days (Patient not taking: Reported on 01/12/2019) 11 tablet 0  . rosuvastatin (CRESTOR) 10 MG tablet TAKE 1 TABLET DAILY 90 tablet 1  . saccharomyces boulardii (FLORASTOR) 250 MG capsule Take 1 capsule (250 mg total) by mouth 2 (two) times daily. (Patient not taking: Reported on 01/12/2019) 28 capsule 0  . triamterene-hydrochlorothiazide (DYAZIDE) 37.5-25 MG capsule Take 1 capsule by mouth every morning.    . zolpidem (AMBIEN) 5 MG tablet Take 5 mg by mouth at bedtime as needed for sleep.      No current facility-administered medications on file prior to visit.     Allergies  Allergen Reactions  . Ibuprofen Hives and Swelling  . Penicillins Hives and Swelling    Has patient had a PCN reaction causing immediate rash, facial/tongue/throat swelling, SOB or lightheadedness with hypotension: Yes Has patient had a PCN reaction causing severe rash involving mucus membranes or skin necrosis: No Has patient had a PCN reaction that required hospitalization: No Has patient had a PCN reaction occurring within the last 10 years: No If all of the above answers are "NO", then may proceed with Cephalosporin use.     Objective:  General: Alert and oriented x3 in no acute distress  Dermatology: Focal Swelling, warmth, redness present on the Right 1st MTPJ, No open lesions bilateral lower extremities, no webspace macerations, no ecchymosis bilateral, all nails x 10 are well manicured.  Vascular: Dorsalis Pedis and Posterior Tibial pedal pulses 1/4, Capillary Fill Time 3 seconds,(+) pedal hair growth bilateral,Temperature gradient increased over the Right 1st MTPJ. Neurology: Gross sensation intact via light touch  bilateral.  Musculoskeletal: There is tenderness with palpation at 1st MTPJ on Right foot,No pain with calf compression bilateral. All joint range of motion is within normal limits except at the right 1st MTPJ where there is pain and limiation, Strength within normal limits in all groups bilateral.   Gait: Unassisted, Antalgic gait avoiding weight on right foot        Assessment and Plan: Problem List Items Addressed This Visit    None    Visit Diagnoses    Arthralgia of foot, right    -  Primary   Relevant Orders   CBC with Differential   Basic Metabolic Panel   Sedimentation Rate   Uric A+ANA+RA Qn+CRP+ASO   Calcium   HLA-B27 Antigen   Body fluid culture   Capsulitis       Relevant Medications   traMADol (ULTRAM) 50 MG tablet   Other Relevant Orders   HLA-B27 Antigen   Body fluid culture   Idiopathic gout, unspecified chronicity, unspecified site       Relevant Orders   HLA-B27 Antigen   Body fluid culture   Right foot pain       Relevant Medications   traMADol (ULTRAM) 50 MG tablet   Other Relevant Orders   HLA-B27 Antigen   Body fluid culture   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 Then local block (1.5cc of lidocaine and marcaine) in a field block fashion then aseptic prep using betadine was performed at the right great toe joint, after anesthesia was confirmed, aspirated 0.5cc of clear gelatinous fluid from right 1st MTPJ and injected right 1st MTPJ with 0.5cc Kenalog 10 into joint  without complication; post  injection care explained. -Sent joint fluid to Micro for analysis  -Rx Tramadol -Dispensed post op shoe  -Ordered arthritic lab panel; will call patient with results if abnormal -Advised patient to call if symptoms are not improved within 1 week -Patient to return after blood work results  Landis Martins, DPM

## 2019-02-11 DIAGNOSIS — M79671 Pain in right foot: Secondary | ICD-10-CM | POA: Diagnosis not present

## 2019-02-11 DIAGNOSIS — M25571 Pain in right ankle and joints of right foot: Secondary | ICD-10-CM | POA: Diagnosis not present

## 2019-02-11 DIAGNOSIS — M06071 Rheumatoid arthritis without rheumatoid factor, right ankle and foot: Secondary | ICD-10-CM | POA: Diagnosis not present

## 2019-02-11 DIAGNOSIS — M1000 Idiopathic gout, unspecified site: Secondary | ICD-10-CM | POA: Diagnosis not present

## 2019-02-11 DIAGNOSIS — M2540 Effusion, unspecified joint: Secondary | ICD-10-CM | POA: Diagnosis not present

## 2019-02-11 DIAGNOSIS — M779 Enthesopathy, unspecified: Secondary | ICD-10-CM | POA: Diagnosis not present

## 2019-02-13 LAB — BASIC METABOLIC PANEL
BUN: 21 mg/dL (ref 7–25)
CO2: 25 mmol/L (ref 20–32)
Calcium: 9.8 mg/dL (ref 8.6–10.4)
Chloride: 106 mmol/L (ref 98–110)
Creat: 0.8 mg/dL (ref 0.50–0.99)
Glucose, Bld: 89 mg/dL (ref 65–99)
Potassium: 4.1 mmol/L (ref 3.5–5.3)
Sodium: 142 mmol/L (ref 135–146)

## 2019-02-13 LAB — CBC WITH DIFFERENTIAL/PLATELET
Absolute Monocytes: 624 cells/uL (ref 200–950)
Basophils Absolute: 16 cells/uL (ref 0–200)
Basophils Relative: 0.2 %
Eosinophils Absolute: 32 cells/uL (ref 15–500)
Eosinophils Relative: 0.4 %
HCT: 41.5 % (ref 35.0–45.0)
Hemoglobin: 13.4 g/dL (ref 11.7–15.5)
Lymphs Abs: 1571 cells/uL (ref 850–3900)
MCH: 28.1 pg (ref 27.0–33.0)
MCHC: 32.3 g/dL (ref 32.0–36.0)
MCV: 87 fL (ref 80.0–100.0)
MPV: 12.2 fL (ref 7.5–12.5)
Monocytes Relative: 7.7 %
Neutro Abs: 5856 cells/uL (ref 1500–7800)
Neutrophils Relative %: 72.3 %
Platelets: 255 10*3/uL (ref 140–400)
RBC: 4.77 10*6/uL (ref 3.80–5.10)
RDW: 13.1 % (ref 11.0–15.0)
Total Lymphocyte: 19.4 %
WBC: 8.1 10*3/uL (ref 3.8–10.8)

## 2019-02-13 LAB — C-REACTIVE PROTEIN: CRP: 3.8 mg/L (ref <8.0)

## 2019-02-13 LAB — ANA: Anti Nuclear Antibody (ANA): NEGATIVE

## 2019-02-13 LAB — RHEUMATOID FACTOR: Rheumatoid fact SerPl-aCnc: <14 IU/mL (ref <14)

## 2019-02-13 LAB — ANTISTREPTOLYSIN O TITER: ASO: <50 IU/mL (ref <200)

## 2019-02-13 LAB — SEDIMENTATION RATE: Sed Rate: 25 mm/h (ref 0–30)

## 2019-02-13 LAB — HLA-B27 ANTIGEN: HLA-B27 Antigen: NEGATIVE

## 2019-02-13 LAB — URIC ACID: Uric Acid, Serum: 7 mg/dL (ref 2.5–7.0)

## 2019-02-16 ENCOUNTER — Other Ambulatory Visit: Payer: Self-pay | Admitting: Cardiology

## 2019-02-16 DIAGNOSIS — I272 Pulmonary hypertension, unspecified: Secondary | ICD-10-CM

## 2019-02-17 DIAGNOSIS — M79645 Pain in left finger(s): Secondary | ICD-10-CM | POA: Diagnosis not present

## 2019-02-18 ENCOUNTER — Other Ambulatory Visit: Payer: Self-pay | Admitting: Internal Medicine

## 2019-02-23 ENCOUNTER — Ambulatory Visit: Payer: Self-pay

## 2019-02-23 DIAGNOSIS — I10 Essential (primary) hypertension: Secondary | ICD-10-CM

## 2019-02-23 DIAGNOSIS — E782 Mixed hyperlipidemia: Secondary | ICD-10-CM

## 2019-02-23 DIAGNOSIS — R7303 Prediabetes: Secondary | ICD-10-CM

## 2019-02-23 NOTE — Chronic Care Management (AMB) (Signed)
  Chronic Care Management   Outreach Note  02/23/2019 Name: [name] MRN: [id number] DOB: December 26, 1950  Referred by: Patients health plan Reason for referral : Care Coordination   An unsuccessful telephone outreach was attempted today. The patient was referred to the case management team by for assistance with chronic care management and care coordination.   Follow Up Plan: A HIPPA compliant phone message was left for the patient providing contact information and requesting a return call.  The CM team will reach out to the patient again over the next 7-10 days.   Daneen Schick, BSW, CDP [facility] Care Management Social Worker [phone]  Total time spent performing care coordination and/or care management activities with the patient by phone or face to face = 5 minutes.

## 2019-02-24 ENCOUNTER — Telehealth: Payer: Self-pay | Admitting: Sports Medicine

## 2019-02-24 NOTE — Telephone Encounter (Signed)
Patient wants results from her blood work. Please call back

## 2019-02-25 ENCOUNTER — Telehealth: Payer: Self-pay | Admitting: *Deleted

## 2019-02-25 NOTE — Telephone Encounter (Signed)
I reviewed what was faxed to me. I look through all pages. I did not see the body fluid result that was sent to Microbiology in that stack that you faxed to me -Dr. Cannon Kettle

## 2019-02-25 NOTE — Telephone Encounter (Signed)
-----   Message from Landis Martins, [place] sent at 02/25/2019  3:12 PM EDT ----- Regarding: Missing Body Fluid Results I have reviewed the blood work that was faxed. Her arthritic panel is negative. I am missing the joint/body fluid result to be able to tell her if her joint fluid was positive for anything. See if we can get this report as well before we call her. Ernestine Conrad was the nurse with me when we sent out the joint fluid in a syringe Thanks Dr. Cannon Kettle

## 2019-02-25 NOTE — Telephone Encounter (Signed)
I don't see any available results. We need to contact the lab to see if the testing is complete before I can call her with results. -Dr Cannon Kettle

## 2019-02-25 NOTE — Telephone Encounter (Signed)
[facility] - Donata Clay faxed the 02/10/2019 results to my fax machine.

## 2019-02-26 NOTE — Telephone Encounter (Signed)
Waiting Dr. Cannon Kettle response to faxed final labs.

## 2019-02-26 NOTE — Telephone Encounter (Signed)
Val  Will you let the patient know that we are following up with the facility on the aspiration/body fluid that was sent but her blood work was negative and does not show any signs of arthritis or increased inflammatory markers.  Thanks Dr. Chauncey Cruel

## 2019-02-26 NOTE — Telephone Encounter (Signed)
[facility] - [place] states there are no records of the specimen or results, and after 5 days the specimen is not viable.

## 2019-02-26 NOTE — Telephone Encounter (Signed)
Will you ask [name]. She sent the specimen and even went over to the lab to remove the needle on the specimen in order to have it processed. Thanks Dr. Chauncey Cruel

## 2019-02-26 NOTE — Telephone Encounter (Signed)
I literally drove over to the facility in [place] to remove the needle from the syringe, I physically saw the specimen in their faclity. I dont understand why they did not perform the test

## 2019-02-27 ENCOUNTER — Telehealth: Payer: Self-pay

## 2019-02-27 NOTE — Telephone Encounter (Signed)
I informed pt of Dr. Leeanne Rio review of blood work results.

## 2019-02-27 NOTE — Telephone Encounter (Signed)
Spoke with patient informing her of the lab error that failed to test the fluid that was drawn off of her foot.  She did state that for several days after her visit the injection helped with the pain, she currently has a reduction in swelling and redness.  She does state however that her pain has returned in her big toe joint.  I advised her to wait a couple weeks and follow-up with Dr. Cannon Kettle to see if further testing is needed and to discuss pain management and possible x-ray.  Patient stated understanding, and was grateful for the phone call.

## 2019-02-27 NOTE — Telephone Encounter (Signed)
Awesome that is great. We will monitor and am glad that she is doing better -Dr. Cannon Kettle

## 2019-02-27 NOTE — Telephone Encounter (Signed)
-----   Message from Landis Martins, [place] sent at 02/27/2019 10:51 AM EDT ----- Yes please let the manager know so that this does not happen again. Also will you let the patient know that the fluid testing was not performed due to a lab error. All her other bloodwork was negative not suggestive of any other types of arthritis. If she wants we to try to draw out more fluid from the joint which is the gold standard of testing to see why her big toe joint swells and is painful to send then we should wait for her to make an appointment with me in 3-4 weeks to allow time for more fluid to accumulate  Thanks Dr. Chauncey Cruel ----- Message ----- From: Roney Jaffe, RN Sent: 02/27/2019   9:18 AM EDT To: Landis Martins, DPM  Dr Cannon Kettle,   I called [facility], they stated that they did not receive an order/test code with this specimen until 02/18/19. They stated by that time the specimen was no longer "good" or testable. I am willing to call the manager or whomever if you would like because the test code is in the system, it is in with the order that was done on 4/21, it was also physically put with the specimen and I saw the order form when I drove out to [facility] to remove the needle. I do not know what has happened but they are using the excuse that the order/test code was not received. Again, it is in the Epic system and they can access it. I just wanted to let you know what has happened with this situation. Either way, the test was not done and they said if we wanted it to be done then we would have to redraw the fluid.   JQ

## 2019-03-02 ENCOUNTER — Ambulatory Visit: Payer: Self-pay

## 2019-03-02 DIAGNOSIS — E782 Mixed hyperlipidemia: Secondary | ICD-10-CM

## 2019-03-02 DIAGNOSIS — R7303 Prediabetes: Secondary | ICD-10-CM

## 2019-03-02 DIAGNOSIS — I10 Essential (primary) hypertension: Secondary | ICD-10-CM

## 2019-03-02 NOTE — Chronic Care Management (AMB) (Signed)
  Chronic Care Management   Outreach Note  03/02/2019 Name: [name] MRN: [id number] DOB: [birth year]  Referred by: Patients Health Plan Reason for referral : Care Coordination   A second unsuccessful telephone outreach was attempted today. The patient was referred to the case management team for assistance with chronic care management and care coordination.   Follow Up Plan: A HIPPA compliant phone message was left for the patient providing contact information and requesting a return call.  The CM team will reach out to the patient again over the next 7-10 days.   Daneen Schick, BSW, CDP [facility] Care Management Social Worker [phone]  Total time spent performing care coordination and/or care management activities with the patient by phone or face to face = 3 minutes.

## 2019-03-20 ENCOUNTER — Ambulatory Visit: Payer: Self-pay

## 2019-03-20 DIAGNOSIS — E782 Mixed hyperlipidemia: Secondary | ICD-10-CM

## 2019-03-20 DIAGNOSIS — I10 Essential (primary) hypertension: Secondary | ICD-10-CM

## 2019-03-20 NOTE — Chronic Care Management (AMB) (Signed)
  Chronic Care Management   Outreach Note  03/20/2019 Name: [name] MRN: [id number] DOB: [birth year]  Referred by: Patients Health Plan Reason for referral : Care Coordination   Third unsuccessful telephone outreach was attempted today. The patient was referred to the case management team for assistance with chronic care management and care coordination. The patient's primary care provider has been notified of our unsuccessful attempts to make or maintain contact with the patient. The care management team is pleased to engage with this patient at any time in the future should he/she be interested in assistance from the care management team.   Follow Up Plan: No further follow up required: The CCM team is available to assist with patient care needs if/when the patient is agreeable.  Daneen Schick, BSW, CDP Social Worker, Certified Dementia Practitioner [place] [facility] [place] [facility] [phone]  Total time spent performing care coordination and/or care management activities with the patient by phone or face to face = 3 minutes.

## 2019-03-26 ENCOUNTER — Ambulatory Visit: Payer: Self-pay

## 2019-03-26 DIAGNOSIS — I10 Essential (primary) hypertension: Secondary | ICD-10-CM

## 2019-03-26 DIAGNOSIS — E782 Mixed hyperlipidemia: Secondary | ICD-10-CM

## 2019-03-26 NOTE — Chronic Care Management (AMB) (Signed)
  Chronic Care Management   Telephone Outreach Note  03/26/2019 Name: [name] MRN: [id number] DOB: Nov 02, 1950  Referred by: patient's health plan.   I received a return call from this patient in response to previous unsuccessful outreach attempts made by this Probation officer. Ms. JODELLE FAUSTO and I briefly discussed care management needs related to HTN  Ms. Zartman was given information about Chronic Care Management services today including:  1. CCM service includes personalized support from designated clinical staff supervised by her physician, including individualized plan of care and coordination with other care providers 2. 24/7 contact phone numbers for assistance for urgent and routine care needs. 3. Service will only be billed when office clinical staff spend 20 minutes or more in a month to coordinate care. 4. Only one practitioner may furnish and bill the service in a calendar month. 5. The patient may stop CCM services at any time (effective at the end of the month) by phone call to the office staff. 6. The patient will be responsible for cost sharing (co-pay) of up to 20% of the service fee (after annual deductible is met).  Patient did not agree to enrollment in care management services and does not wish to consider at this time.  No further follow up required: The patient has contact number of CCM SW and will contact at a later date if services are desired.   Glendale Chard, MD has been notified of this outreach and Ms. Massie Kluver Diven's decision and plan.   Daneen Schick, BSW, CDP Social Worker, Certified Dementia Practitioner [place] [facility] [place] [facility] [phone]  Total time spent performing care coordination and/or care management activities with the patient by phone or face to face = 13 minutes.

## 2019-04-09 DIAGNOSIS — R194 Change in bowel habit: Secondary | ICD-10-CM | POA: Diagnosis not present

## 2019-04-09 DIAGNOSIS — R159 Full incontinence of feces: Secondary | ICD-10-CM | POA: Diagnosis not present

## 2019-04-09 DIAGNOSIS — K5730 Diverticulosis of large intestine without perforation or abscess without bleeding: Secondary | ICD-10-CM | POA: Diagnosis not present

## 2019-04-09 DIAGNOSIS — Z86010 Personal history of colonic polyps: Secondary | ICD-10-CM | POA: Diagnosis not present

## 2019-04-21 ENCOUNTER — Ambulatory Visit (INDEPENDENT_AMBULATORY_CARE_PROVIDER_SITE_OTHER): Payer: Medicare Other | Admitting: Sports Medicine

## 2019-04-21 ENCOUNTER — Encounter: Payer: Self-pay | Admitting: Sports Medicine

## 2019-04-21 ENCOUNTER — Other Ambulatory Visit: Payer: Self-pay

## 2019-04-21 VITALS — Temp 97.9°F

## 2019-04-21 DIAGNOSIS — M79674 Pain in right toe(s): Secondary | ICD-10-CM | POA: Diagnosis not present

## 2019-04-21 DIAGNOSIS — M79671 Pain in right foot: Secondary | ICD-10-CM

## 2019-04-21 DIAGNOSIS — M1000 Idiopathic gout, unspecified site: Secondary | ICD-10-CM

## 2019-04-21 DIAGNOSIS — M21611 Bunion of right foot: Secondary | ICD-10-CM | POA: Diagnosis not present

## 2019-04-21 DIAGNOSIS — M25571 Pain in right ankle and joints of right foot: Secondary | ICD-10-CM

## 2019-04-21 DIAGNOSIS — M779 Enthesopathy, unspecified: Secondary | ICD-10-CM | POA: Diagnosis not present

## 2019-04-21 MED ORDER — PREDNISONE 10 MG (21) PO TBPK: ORAL_TABLET | ORAL | 0 refills | Status: DC

## 2019-04-21 NOTE — Patient Instructions (Signed)
Pre-Operative Instructions  Congratulations, you have decided to take an important step towards improving your quality of life.  You can be assured that the doctors and staff at [facility] will be with you every step of the way.  Here are some important things you should know:  1. Plan to be at the surgery center/hospital at least 1 (one) hour prior to your scheduled time, unless otherwise directed by the surgical center/hospital staff.  You must have a responsible adult accompany you, remain during the surgery and drive you home.  Make sure you have directions to the surgical center/hospital to ensure you arrive on time. 2. If you are having surgery at [facility] or [facility] hospitals, you will need a copy of your medical history and physical form from your family physician within one month prior to the date of surgery. We will give you a form for your primary physician to complete.  3. We make every effort to accommodate the date you request for surgery.  However, there are times where surgery dates or times have to be moved.  We will contact you as soon as possible if a change in schedule is required.   4. No aspirin/ibuprofen for one week before surgery.  If you are on aspirin, any non-steroidal anti-inflammatory medications (Mobic, Aleve, Ibuprofen) should not be taken seven (7) days prior to your surgery.  You make take Tylenol for pain prior to surgery.  5. Medications - If you are taking daily heart and blood pressure medications, seizure, reflux, allergy, asthma, anxiety, pain or diabetes medications, make sure you notify the surgery center/hospital before the day of surgery so they can tell you which medications you should take or avoid the day of surgery. 6. No food or drink after midnight the night before surgery unless directed otherwise by surgical center/hospital staff. 7. No alcoholic beverages 24-hours prior to surgery.  No smoking 24-hours prior or 24-hours after  surgery. 8. Wear loose pants or shorts. They should be loose enough to fit over bandages, boots, and casts. 9. Don't wear slip-on shoes. Sneakers are preferred. 10. Bring your boot with you to the surgery center/hospital.  Also bring crutches or a walker if your physician has prescribed it for you.  If you do not have this equipment, it will be provided for you after surgery. 11. If you have not been contacted by the surgery center/hospital by the day before your surgery, call to confirm the date and time of your surgery. 12. Leave-time from work may vary depending on the type of surgery you have.  Appropriate arrangements should be made prior to surgery with your employer. 13. Prescriptions will be provided immediately following surgery by your doctor.  Fill these as soon as possible after surgery and take the medication as directed. Pain medications will not be refilled on weekends and must be approved by the doctor. 14. Remove nail polish on the operative foot and avoid getting pedicures prior to surgery. 15. Wash the night before surgery.  The night before surgery wash the foot and leg well with water and the antibacterial soap provided. Be sure to pay special attention to beneath the toenails and in between the toes.  Wash for at least three (3) minutes. Rinse thoroughly with water and dry well with a towel.  Perform this wash unless told not to do so by your physician.  Enclosed: 1 Ice pack (please put in freezer the night before surgery)   1 Hibiclens skin cleaner     Pre-op instructions  If you have any questions regarding the instructions, please do not hesitate to call our office.  [place]: [address], [place], [place] [address] -- [phone]  [place]: [address] [place], [place] [address] -- [phone]  [place]: [address]  [place], [place] [address] -- [phone]  [place]: [address], [address], [place],  [address] -- [phone]  Website:  [email or web address] 

## 2019-04-21 NOTE — Progress Notes (Signed)
Subjective: [name] is a 68 y.o. female patient who returns to office for evaluation of Right>Left foot pain. Patient complains of pain that is back at the big toe joint. Reports that the injection helped some but now it is throbbing at night and is hurting even when she is not on it. Reports that she is also getting some pain over the left bunion as well. Denies any changes with medical history. 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 No other issues noted.  Patient Active Problem List   Diagnosis Date Noted  . Bunion of great toe of right foot 01/12/2019  . Cellulitis 12/23/2018  . Foot infection   . Great toe pain, right 12/17/2018  . Prediabetes 12/17/2018  . Cellulitis of foot 08/23/2017  . Hypokalemia 08/23/2017  . Trigeminal neuralgia of right side of face 03/26/2016  . Hyperlipidemia 12/04/2013  . Hypertension 12/04/2013  . Constipation 12/04/2013  . Insomnia 12/04/2013  . Acute blood loss anemia 12/04/2013  . Left hip pain 12/04/2013  . 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 acetaminophen (TYLENOL) 500 MG tablet Take 1,000 mg by mouth daily as needed for mild pain.    Marland Kitchen BYSTOLIC 5 MG tablet TAKE 1 TABLET DAILY 90 tablet 2  . Carboxymethylcellulose Sodium (EYE DROPS OP) Apply 1 drop to eye 2 (two) times daily as needed (dry eyes).    . celecoxib (CELEBREX) 200 MG capsule TK 1 C PO QD    . cholecalciferol (VITAMIN D) 1000 UNITS tablet Take 1,000 Units by mouth daily.     . diphenhydrAMINE (BENADRYL) 25 MG tablet Take 25 mg by mouth at bedtime as needed for itching.    . diphenhydrAMINE-Zinc Acetate (BENADRYL ITCH RELIEF EX) Apply 1 application topically 3 (three) times daily as needed (itching).    . predniSONE (DELTASONE) 20 MG tablet Take 40mg  x 3 days, 20mg  x 3 days, 10mg  x 4 days 11 tablet 0  . rosuvastatin (CRESTOR) 10 MG  tablet TAKE 1 TABLET DAILY 90 tablet 1  . saccharomyces boulardii (FLORASTOR) 250 MG capsule Take 1 capsule (250 mg total) by mouth 2 (two) times daily. 28 capsule 0  . triamterene-hydrochlorothiazide (DYAZIDE) 37.5-25 MG capsule TAKE 1 CAPSULE DAILY 90 capsule 2  . zolpidem (AMBIEN) 5 MG tablet Take 5 mg by mouth at bedtime as needed for sleep.     . colchicine 0.6 MG tablet Take 1 tablet (0.6 mg total) by mouth daily for 10 days. 10 tablet 0   No current facility-administered medications on file prior to visit.     Allergies  Allergen Reactions  . Ibuprofen Hives and Swelling  . Penicillins Hives and Swelling    Has patient had a PCN reaction causing immediate rash, facial/tongue/throat swelling, SOB or lightheadedness with hypotension: Yes Has patient had a PCN reaction causing severe rash involving mucus membranes or skin necrosis: No Has patient had a PCN reaction that required hospitalization: No Has patient had a PCN reaction occurring within the last 10 years: No If all of the above answers are "NO", then may proceed with Cephalosporin use.    Social History   Socioeconomic History  . Marital status: Married    Spouse name: Not on file  . Number of children: 2  . Years of education: 53  . Highest education level: Not on file  Occupational History  . Occupation: Retired  Scientific laboratory technician  . Financial resource strain: Not hard at all  . Food insecurity    Worry: Never true    Inability: Never true  . Transportation needs    Medical: No    Non-medical: No  Tobacco Use  . Smoking status: Passive Smoke Exposure - Never Smoker  . Smokeless tobacco: Never Used  Substance and Sexual Activity  . Alcohol use: Not Currently    Comment: occasionally  . Drug use: No  . Sexual activity: Not Currently    Birth control/protection: Surgical  Lifestyle  . Physical activity    Days per week: 3 days    Minutes per session: 30 min  . Stress: Not at all  Relationships  . Social  Herbalist on phone: Not on file    Gets together: Not on file    Attends religious service: Not on file    Active member of club or organization: Not on file    Attends meetings of clubs or organizations: Not on file    Relationship status: Not on file  Other Topics Concern  . Not on file  Social History Narrative   Lives at home w/ her husband   Right-handed   About 1 cup of coffee every other day    Family History  Problem Relation Age of Onset  . Heart attack Mother   . Alzheimer's disease Mother   . Diabetes Sister   . Hypertension Sister     Past Surgical History:  Procedure Laterality Date  . ABDOMINAL HYSTERECTOMY    . BREAST BIOPSY  05/03/2006   [place]  . BREAST REDUCTION SURGERY  1987  . CHOLECYSTECTOMY    . COLONOSCOPY    . ESOPHAGOGASTRODUODENOSCOPY    . EYE SURGERY Right    eye lash removed  . JOINT REPLACEMENT     bilateral hip rt in 2007 and lt in 2010  . NM MYOCAR PERF WALL MOTION  11/04/2008   Normal  . REDUCTION MAMMAPLASTY Bilateral 1988  . REDUCTION MAMMAPLASTY    . SHOULDER ARTHROSCOPY WITH DISTAL CLAVICLE RESECTION Right 09/21/2015   Procedure: RIGHT SHOULDER ARTHROSCOPY WITH DISTAL CLAVICLE EXCISION DEBRIDE LABRAL TEAR ACROMIOPLASTY ;  Surgeon: Frederik Pear, MD;  Location: [facility];  Service: Orthopedics;  Laterality: Right;  . TEE WITHOUT CARDIOVERSION N/A 04/12/2015   Procedure: TRANSESOPHAGEAL ECHOCARDIOGRAM (TEE)/BUBBLE STUDY;  Surgeon: Adrian Prows, MD;  Location: [facility];  Service: Cardiovascular;  Laterality: N/A;  . TOTAL HIP REVISION  04/14/2012   Procedure: TOTAL HIP REVISION;  Surgeon: Kerin Salen, MD;  Location: [facility];  Service: Orthopedics;  Laterality: Right;  right total hip revision  . TOTAL HIP REVISION Left 11/30/2013   Procedure: TOTAL HIP REVISION;  Surgeon: Kerin Salen, MD;  Location: [facility];  Service: Orthopedics;  Laterality: Left;    Objective:  General: Alert and oriented x3 in no acute  distress  Dermatology: Mild Swelling, warmth, redness present on the Right>Left 1st MTPJ, No open lesions bilateral lower extremities, no webspace macerations, no ecchymosis bilateral, all nails x 10 are well manicured.  Vascular: Dorsalis Pedis and Posterior Tibial pedal pulses 1/4, Capillary Fill Time 3 seconds,(+) pedal hair growth bilateral,Temperature gradient increased over the Right 1st MTPJ. Neurology: Johney Maine sensation intact via light touch bilateral.  Musculoskeletal: There is tenderness with palpation at 1st MTPJ on Right>Left foot,No pain with calf compression bilateral. All joint range of motion is within normal limits  except at the right 1st MTPJ where there is pain and limiation, Strength within normal limits in all groups bilateral.        Assessment and Plan: Problem List Items Addressed This Visit    None    Visit Diagnoses    Arthralgia of foot, right    -  Primary   Capsulitis       Idiopathic gout, unspecified chronicity, unspecified site       Right foot pain         -Complete examination performed -Discussed treatement options for capsulitis vs gout vs underlying inflammatory condition with Bunion -Patient opt for surgical management. Consent obtained for Remove bump at big toe joint (Silver) on right. Pre and Post op course explained. Risks, benefits, alternatives explained. No guarantees given or implied. Surgical booking slip submitted and provided patient with Surgical packet and info for [facility]. -To dispense surgical shoe to use post op at [facility] -Ordered Prednisone for patient to take meanwhile until time for surgery on July 20th  -Patient to return after surgery or sooner if problems arise.   Landis Martins, DPM

## 2019-05-01 ENCOUNTER — Other Ambulatory Visit: Payer: Self-pay | Admitting: Internal Medicine

## 2019-05-04 ENCOUNTER — Telehealth: Payer: Self-pay

## 2019-05-04 NOTE — Telephone Encounter (Signed)
Mr. Waldren was notified that Dr. Baird Cancer called in a prescription of cyclobenzaprine for the pt to help with her muscle spasms.

## 2019-05-10 ENCOUNTER — Other Ambulatory Visit: Payer: Self-pay | Admitting: Sports Medicine

## 2019-05-10 DIAGNOSIS — G8918 Other acute postprocedural pain: Secondary | ICD-10-CM

## 2019-05-10 DIAGNOSIS — T402X5A Adverse effect of other opioids, initial encounter: Secondary | ICD-10-CM

## 2019-05-10 DIAGNOSIS — R110 Nausea: Secondary | ICD-10-CM

## 2019-05-10 DIAGNOSIS — K5903 Drug induced constipation: Secondary | ICD-10-CM

## 2019-05-10 NOTE — Progress Notes (Signed)
Post op pain meds entered -Dr. Cannon Kettle

## 2019-05-11 DIAGNOSIS — I10 Essential (primary) hypertension: Secondary | ICD-10-CM | POA: Diagnosis not present

## 2019-05-11 DIAGNOSIS — M21611 Bunion of right foot: Secondary | ICD-10-CM | POA: Diagnosis not present

## 2019-05-11 DIAGNOSIS — M2011 Hallux valgus (acquired), right foot: Secondary | ICD-10-CM | POA: Diagnosis not present

## 2019-05-11 DIAGNOSIS — M13871 Other specified arthritis, right ankle and foot: Secondary | ICD-10-CM | POA: Diagnosis not present

## 2019-05-11 HISTORY — PX: BUNIONECTOMY: SHX129

## 2019-05-11 MED ORDER — DOCUSATE SODIUM 100 MG PO CAPS: 100.0000 mg | ORAL_CAPSULE | Freq: Two times a day (BID) | ORAL | 0 refills | Status: DC

## 2019-05-11 MED ORDER — HYDROCODONE-ACETAMINOPHEN 10-325 MG PO TABS: 1.0000 | ORAL_TABLET | Freq: Four times a day (QID) | ORAL | 0 refills | Status: AC | PRN

## 2019-05-11 MED ORDER — PROMETHAZINE HCL 25 MG PO TABS: 25.0000 mg | ORAL_TABLET | Freq: Three times a day (TID) | ORAL | 0 refills | Status: DC | PRN

## 2019-05-12 ENCOUNTER — Telehealth: Payer: Self-pay | Admitting: Sports Medicine

## 2019-05-12 ENCOUNTER — Encounter: Payer: Self-pay | Admitting: Sports Medicine

## 2019-05-12 NOTE — Progress Notes (Signed)
s/p R silver bunionectomy at [facility] on 05-11-19 with Dr. Cannon Kettle

## 2019-05-12 NOTE — Telephone Encounter (Signed)
Post op phone call made to patient. Patient reports that she is having some pain and is trying to stay in front of the pain and has taken a dose of Tylenol in between and may switch to Advil. I advised patient that she may switch and to continue to rest, ice, and elevate to help with pain control. Advised patient to continue to limit her walking and standing and she said that she will get her husband to get her bedside toilet so she does not have to walk much for the bathroom. I advised patient as well to check her dressing to make sure its not too tight as well. Patient expressed understanding and thanked me for my call and reports that she will be in to see me next week. -Dr. Cannon Kettle

## 2019-05-19 ENCOUNTER — Other Ambulatory Visit: Payer: Self-pay

## 2019-05-19 ENCOUNTER — Ambulatory Visit (INDEPENDENT_AMBULATORY_CARE_PROVIDER_SITE_OTHER): Payer: Self-pay | Admitting: Sports Medicine

## 2019-05-19 ENCOUNTER — Encounter: Payer: Self-pay | Admitting: Sports Medicine

## 2019-05-19 ENCOUNTER — Ambulatory Visit (INDEPENDENT_AMBULATORY_CARE_PROVIDER_SITE_OTHER): Payer: Medicare Other

## 2019-05-19 DIAGNOSIS — G8918 Other acute postprocedural pain: Secondary | ICD-10-CM

## 2019-05-19 DIAGNOSIS — M25571 Pain in right ankle and joints of right foot: Secondary | ICD-10-CM

## 2019-05-19 DIAGNOSIS — M21611 Bunion of right foot: Secondary | ICD-10-CM | POA: Diagnosis not present

## 2019-05-19 DIAGNOSIS — Z09 Encounter for follow-up examination after completed treatment for conditions other than malignant neoplasm: Secondary | ICD-10-CM

## 2019-05-19 MED ORDER — TRAMADOL HCL 50 MG PO TABS: 50.0000 mg | ORAL_TABLET | Freq: Three times a day (TID) | ORAL | 0 refills | Status: AC | PRN

## 2019-05-19 MED ORDER — HYDROCODONE-ACETAMINOPHEN 5-325 MG PO TABS: 1.0000 | ORAL_TABLET | Freq: Four times a day (QID) | ORAL | 0 refills | Status: AC | PRN

## 2019-05-19 NOTE — Progress Notes (Signed)
Subjective: [name] is a 68 y.o. female patient seen today in office for POV #1 DOS 05-11-19, S/P Silver bunionectomy on right. Patient admits pain at surgical site worse at night, denies calf pain, denies headache, chest pain, shortness of breath, nausea, vomiting, fever, or chills. No other issues noted.   Patient Active Problem List   Diagnosis Date Noted  . Bunion of great toe of right foot 01/12/2019  . Cellulitis 12/23/2018  . Foot infection   . Great toe pain, right 12/17/2018  . Prediabetes 12/17/2018  . Cellulitis of foot 08/23/2017  . Hypokalemia 08/23/2017  . Trigeminal neuralgia of right side of face 03/26/2016  . Hyperlipidemia 12/04/2013  . Hypertension 12/04/2013  . Constipation 12/04/2013  . Insomnia 12/04/2013  . Acute blood loss anemia 12/04/2013  . Left hip pain 12/04/2013  . 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 acetaminophen (TYLENOL) 500 MG tablet Take 1,000 mg by mouth daily as needed for mild pain.    Marland Kitchen BYSTOLIC 5 MG tablet TAKE 1 TABLET DAILY 90 tablet 2  . Carboxymethylcellulose Sodium (EYE DROPS OP) Apply 1 drop to eye 2 (two) times daily as needed (dry eyes).    . celecoxib (CELEBREX) 200 MG capsule TK 1 C PO QD    . cholecalciferol (VITAMIN D) 1000 UNITS tablet Take 1,000 Units by mouth daily.     . colchicine 0.6 MG tablet Take 1 tablet (0.6 mg total) by mouth daily for 10 days. 10 tablet 0  . diphenhydrAMINE (BENADRYL) 25 MG tablet Take 25 mg by mouth at bedtime as needed for itching.    . diphenhydrAMINE-Zinc Acetate (BENADRYL ITCH RELIEF EX) Apply 1 application topically 3 (three) times daily as needed (itching).    Marland Kitchen docusate sodium (COLACE) 100 MG capsule Take 1 capsule (100 mg total) by mouth 2 (two) times daily. 10 capsule 0  . predniSONE (DELTASONE) 20 MG tablet Take 40mg  x 3 days, 20mg  x 3 days,  10mg  x 4 days 11 tablet 0  . predniSONE (STERAPRED UNI-PAK 21 TAB) 10 MG (21) TBPK tablet Take as directed 21 tablet 0  . promethazine (PHENERGAN) 25 MG tablet Take 1 tablet (25 mg total) by mouth every 8 (eight) hours as needed for nausea or vomiting. 20 tablet 0  . rosuvastatin (CRESTOR) 10 MG tablet TAKE 1 TABLET DAILY 90 tablet 1  . saccharomyces boulardii (FLORASTOR) 250 MG capsule Take 1 capsule (250 mg total) by mouth 2 (two) times daily. 28 capsule 0  . triamterene-hydrochlorothiazide (DYAZIDE) 37.5-25 MG capsule TAKE 1 CAPSULE DAILY 90 capsule 2  . zolpidem (AMBIEN) 5 MG tablet Take 5 mg by mouth at bedtime as needed for sleep.      No current facility-administered medications on file prior to visit.     Allergies  Allergen Reactions  . Ibuprofen Hives and Swelling  . Penicillins Hives and Swelling    Has patient had a PCN reaction causing immediate rash, facial/tongue/throat swelling, SOB or lightheadedness with hypotension: Yes Has patient had a PCN reaction causing severe rash involving mucus membranes or skin necrosis: No Has patient had a PCN reaction that required hospitalization: No Has patient had a PCN reaction occurring within the last 10 years: No If all of the above answers are "NO", then may proceed with Cephalosporin use.     Objective: There were  no vitals filed for this visit.  General: No acute distress, AAOx3  Right foot: Sutures intact with no gapping or dehiscence at surgical site, mild swelling to right foot, no erythema, no warmth, no drainage, no signs of infection noted, Capillary fill time <3 seconds in all digits, gross sensation present via light touch to right foot.  Mild guarding with range of motion on right.  No pain with calf compression.   Post Op Xray, Right foot: Consistent with postoperative status.  Soft tissue swelling within normal limits for post op status.   Assessment and Plan:  Problem List Items Addressed This Visit       Musculoskeletal and Integument   Bunion of great toe of right foot - Primary   Relevant Orders   DG Foot Complete Right (Completed)    Other Visit Diagnoses    Surgery follow-up       Relevant Orders   DG Foot Complete Right (Completed)   Post-op pain       Arthralgia of foot, right           -Patient seen and evaluated -X-rays reviewed -Applied dry sterile dressing to surgical site right foot secured with ACE wrap and stockinet  -Advised patient to make sure to keep dressings clean, dry, and intact to right surgical site, removing the ACE as needed  -Advised patient to continue with post-op shoe on right foot -Advised patient to limit activity to necessity  -Advised patient to ice and elevate as necessary  -Will plan for possible suture removal at next office visit. In the meantime, patient to call office if any issues or problems arise.   Landis Martins, DPM

## 2019-05-26 ENCOUNTER — Ambulatory Visit (INDEPENDENT_AMBULATORY_CARE_PROVIDER_SITE_OTHER): Payer: Medicare Other | Admitting: Sports Medicine

## 2019-05-26 ENCOUNTER — Encounter: Payer: Self-pay | Admitting: Sports Medicine

## 2019-05-26 ENCOUNTER — Other Ambulatory Visit: Payer: Self-pay

## 2019-05-26 VITALS — Temp 97.2°F

## 2019-05-26 DIAGNOSIS — Z09 Encounter for follow-up examination after completed treatment for conditions other than malignant neoplasm: Secondary | ICD-10-CM

## 2019-05-26 DIAGNOSIS — M21611 Bunion of right foot: Secondary | ICD-10-CM

## 2019-05-26 NOTE — Progress Notes (Signed)
Subjective: [name] is a 68 y.o. female patient seen today in office for POV #2 DOS 05-11-19, S/P Silver bunionectomy on right. Patient admits pain at surgical site that is getting better, had an episode of pain over the weekend, denies calf pain, denies headache, chest pain, shortness of breath, nausea, vomiting, fever, or chills. No other issues noted.   Patient Active Problem List   Diagnosis Date Noted  . Bunion of great toe of right foot 01/12/2019  . Cellulitis 12/23/2018  . Foot infection   . Great toe pain, right 12/17/2018  . Prediabetes 12/17/2018  . Cellulitis of foot 08/23/2017  . Hypokalemia 08/23/2017  . Trigeminal neuralgia of right side of face 03/26/2016  . Hyperlipidemia 12/04/2013  . Hypertension 12/04/2013  . Constipation 12/04/2013  . Insomnia 12/04/2013  . Acute blood loss anemia 12/04/2013  . Left hip pain 12/04/2013  . 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 acetaminophen (TYLENOL) 500 MG tablet Take 1,000 mg by mouth daily as needed for mild pain.    Marland Kitchen BYSTOLIC 5 MG tablet TAKE 1 TABLET DAILY 90 tablet 2  . Carboxymethylcellulose Sodium (EYE DROPS OP) Apply 1 drop to eye 2 (two) times daily as needed (dry eyes).    . celecoxib (CELEBREX) 200 MG capsule TK 1 C PO QD    . cholecalciferol (VITAMIN D) 1000 UNITS tablet Take 1,000 Units by mouth daily.     . colchicine 0.6 MG tablet Take 1 tablet (0.6 mg total) by mouth daily for 10 days. 10 tablet 0  . diphenhydrAMINE (BENADRYL) 25 MG tablet Take 25 mg by mouth at bedtime as needed for itching.    . diphenhydrAMINE-Zinc Acetate (BENADRYL ITCH RELIEF EX) Apply 1 application topically 3 (three) times daily as needed (itching).    Marland Kitchen docusate sodium (COLACE) 100 MG capsule Take 1 capsule (100 mg total) by mouth 2 (two) times daily. 10 capsule 0  .  HYDROcodone-acetaminophen (NORCO) 5-325 MG tablet Take 1 tablet by mouth every 6 (six) hours as needed for up to 7 days for moderate pain or severe pain (not relieved by tramadol). 15 tablet 0  . predniSONE (DELTASONE) 20 MG tablet Take 40mg  x 3 days, 20mg  x 3 days, 10mg  x 4 days 11 tablet 0  . predniSONE (STERAPRED UNI-PAK 21 TAB) 10 MG (21) TBPK tablet Take as directed 21 tablet 0  . promethazine (PHENERGAN) 25 MG tablet Take 1 tablet (25 mg total) by mouth every 8 (eight) hours as needed for nausea or vomiting. 20 tablet 0  . rosuvastatin (CRESTOR) 10 MG tablet TAKE 1 TABLET DAILY 90 tablet 1  . saccharomyces boulardii (FLORASTOR) 250 MG capsule Take 1 capsule (250 mg total) by mouth 2 (two) times daily. 28 capsule 0  . triamterene-hydrochlorothiazide (DYAZIDE) 37.5-25 MG capsule TAKE 1 CAPSULE DAILY 90 capsule 2  . zolpidem (AMBIEN) 5 MG tablet Take 5 mg by mouth at bedtime as needed for sleep.      No current facility-administered medications on file prior to visit.     Allergies  Allergen Reactions  . Ibuprofen Hives and Swelling  . Penicillins Hives and Swelling    Has patient had a PCN reaction causing immediate rash, facial/tongue/throat swelling, SOB or lightheadedness with hypotension: Yes Has patient had a PCN reaction causing severe rash involving mucus membranes or skin necrosis:  No Has patient had a PCN reaction that required hospitalization: No Has patient had a PCN reaction occurring within the last 10 years: No If all of the above answers are "NO", then may proceed with Cephalosporin use.     Objective: There were no vitals filed for this visit.  General: No acute distress, AAOx3  Right foot: Sutures intact with no gapping or dehiscence at surgical site, mild swelling to right foot, no erythema, no warmth, no drainage, no signs of infection noted, Capillary fill time <3 seconds in all digits, gross sensation present via light touch to right foot.  Mild guarding with  range of motion on right.  No pain with calf compression.   Assessment and Plan:  Problem List Items Addressed This Visit      Musculoskeletal and Integument   Bunion of great toe of right foot - Primary    Other Visit Diagnoses    Surgery follow-up           -Patient seen and evaluated -Steristrips applied, patient may shower and advised her that the sutures will fall off since they are absorable -Recommend antibiotic cream to the incision site -Recommend normal shoe as tolerated, may slowly wean from post op shoe -Advised patient to limit activity to tolerance  -Advised patient to ice and elevate as necessary  -Will plan for xray and post op check at next office visit. In the meantime, patient to call office if any issues or problems arise.   Landis Martins, DPM

## 2019-06-01 ENCOUNTER — Other Ambulatory Visit: Payer: Self-pay | Admitting: Internal Medicine

## 2019-06-16 ENCOUNTER — Other Ambulatory Visit: Payer: Self-pay

## 2019-06-16 ENCOUNTER — Ambulatory Visit (INDEPENDENT_AMBULATORY_CARE_PROVIDER_SITE_OTHER): Payer: Medicare Other | Admitting: Sports Medicine

## 2019-06-16 ENCOUNTER — Ambulatory Visit (INDEPENDENT_AMBULATORY_CARE_PROVIDER_SITE_OTHER): Payer: Medicare Other

## 2019-06-16 ENCOUNTER — Encounter: Payer: Self-pay | Admitting: Sports Medicine

## 2019-06-16 DIAGNOSIS — Z09 Encounter for follow-up examination after completed treatment for conditions other than malignant neoplasm: Secondary | ICD-10-CM

## 2019-06-16 DIAGNOSIS — M2011 Hallux valgus (acquired), right foot: Secondary | ICD-10-CM

## 2019-06-16 NOTE — Progress Notes (Signed)
Subjective: [name] is a 68 y.o. female patient seen today in office for POV #3 DOS 05-11-19, S/P Silver bunionectomy on right. Patient admits pain at surgical site that is getting better, occassional shooting pain, calf pain, denies headache, chest pain, shortness of breath, nausea, vomiting, fever, or chills. No other issues noted.   Patient Active Problem List   Diagnosis Date Noted  . Bunion of great toe of right foot 01/12/2019  . Cellulitis 12/23/2018  . Foot infection   . Great toe pain, right 12/17/2018  . Prediabetes 12/17/2018  . Cellulitis of foot 08/23/2017  . Hypokalemia 08/23/2017  . Trigeminal neuralgia of right side of face 03/26/2016  . Hyperlipidemia 12/04/2013  . Hypertension 12/04/2013  . Constipation 12/04/2013  . Insomnia 12/04/2013  . Acute blood loss anemia 12/04/2013  . Left hip pain 12/04/2013  . 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 acetaminophen (TYLENOL) 500 MG tablet Take 1,000 mg by mouth daily as needed for mild pain.    Marland Kitchen BYSTOLIC 5 MG tablet TAKE 1 TABLET DAILY 90 tablet 2  . Carboxymethylcellulose Sodium (EYE DROPS OP) Apply 1 drop to eye 2 (two) times daily as needed (dry eyes).    . celecoxib (CELEBREX) 200 MG capsule TK 1 C PO QD    . cholecalciferol (VITAMIN D) 1000 UNITS tablet Take 1,000 Units by mouth daily.     . clindamycin (CLEOCIN) 300 MG capsule TK 2 CAPSULES PO NOW THEN TK 1 C PO Q 6 H UNTIL GONE    . cyclobenzaprine (FLEXERIL) 10 MG tablet TK 1 T PO HS PRN    . diphenhydrAMINE (BENADRYL) 25 MG tablet Take 25 mg by mouth at bedtime as needed for itching.    . diphenhydrAMINE-Zinc Acetate (BENADRYL ITCH RELIEF EX) Apply 1 application topically 3 (three) times daily as needed (itching).    Marland Kitchen docusate sodium (COLACE) 100 MG capsule Take 1 capsule (100 mg total) by mouth 2 (two) times  daily. 10 capsule 0  . predniSONE (DELTASONE) 20 MG tablet Take 40mg  x 3 days, 20mg  x 3 days, 10mg  x 4 days 11 tablet 0  . predniSONE (STERAPRED UNI-PAK 21 TAB) 10 MG (21) TBPK tablet Take as directed 21 tablet 0  . promethazine (PHENERGAN) 25 MG tablet Take 1 tablet (25 mg total) by mouth every 8 (eight) hours as needed for nausea or vomiting. 20 tablet 0  . rosuvastatin (CRESTOR) 10 MG tablet TAKE 1 TABLET DAILY 90 tablet 3  . saccharomyces boulardii (FLORASTOR) 250 MG capsule Take 1 capsule (250 mg total) by mouth 2 (two) times daily. 28 capsule 0  . triamterene-hydrochlorothiazide (DYAZIDE) 37.5-25 MG capsule TAKE 1 CAPSULE DAILY 90 capsule 2  . zolpidem (AMBIEN) 5 MG tablet Take 5 mg by mouth at bedtime as needed for sleep.     . colchicine 0.6 MG tablet Take 1 tablet (0.6 mg total) by mouth daily for 10 days. 10 tablet 0   No current facility-administered medications on file prior to visit.     Allergies  Allergen Reactions  . Ibuprofen Hives and Swelling  . Penicillins Hives and Swelling    Has patient had a PCN reaction causing immediate rash, facial/tongue/throat swelling, SOB or lightheadedness with hypotension: Yes Has patient had a PCN reaction causing severe rash involving mucus membranes or skin necrosis: No Has patient  had a PCN reaction that required hospitalization: No Has patient had a PCN reaction occurring within the last 10 years: No If all of the above answers are "NO", then may proceed with Cephalosporin use.     Objective: There were no vitals filed for this visit.  General: No acute distress, AAOx3  Right foot: Incision well healed with no gapping or dehiscence at surgical site, dry skin, mild swelling to right foot, no erythema, no warmth, no drainage, no signs of infection noted, Capillary fill time <3 seconds in all digits, gross sensation present via light touch to right foot.  Mild guarding with range of motion on right.  No pain with calf compression.    Xray consistent with post op status  Assessment and Plan:  Problem List Items Addressed This Visit    None    Visit Diagnoses    Hav (hallux abducto valgus), right    -  Primary   Relevant Orders   DG Foot Complete Right   Surgery follow-up           -Patient seen and evaluated -Xrays reviewed -Continue with normal shoe -Dispensed toe spacer to use as instructed  -Encourage range of motion and emollients  -Advised patient to limit activity to tolerance  -Advised patient to ice and elevate as necessary  -Will plan for post op check at next office visit. In the meantime, patient to call office if any issues or problems arise.   Landis Martins, DPM

## 2019-06-17 ENCOUNTER — Encounter: Payer: Self-pay | Admitting: Internal Medicine

## 2019-06-17 ENCOUNTER — Ambulatory Visit (INDEPENDENT_AMBULATORY_CARE_PROVIDER_SITE_OTHER): Payer: Medicare Other | Admitting: Internal Medicine

## 2019-06-17 VITALS — BP 110/68 | HR 61 | Temp 97.2°F | Ht 60.6 in | Wt 158.0 lb

## 2019-06-17 DIAGNOSIS — Z6830 Body mass index (BMI) 30.0-30.9, adult: Secondary | ICD-10-CM

## 2019-06-17 DIAGNOSIS — E782 Mixed hyperlipidemia: Secondary | ICD-10-CM

## 2019-06-17 DIAGNOSIS — R7303 Prediabetes: Secondary | ICD-10-CM

## 2019-06-17 DIAGNOSIS — E6609 Other obesity due to excess calories: Secondary | ICD-10-CM | POA: Diagnosis not present

## 2019-06-17 DIAGNOSIS — M2550 Pain in unspecified joint: Secondary | ICD-10-CM | POA: Diagnosis not present

## 2019-06-17 DIAGNOSIS — Z23 Encounter for immunization: Secondary | ICD-10-CM | POA: Diagnosis not present

## 2019-06-17 DIAGNOSIS — F5101 Primary insomnia: Secondary | ICD-10-CM | POA: Diagnosis not present

## 2019-06-17 DIAGNOSIS — I10 Essential (primary) hypertension: Secondary | ICD-10-CM | POA: Diagnosis not present

## 2019-06-17 MED ORDER — ZOLPIDEM TARTRATE 5 MG PO TABS: 5.0000 mg | ORAL_TABLET | Freq: Every evening | ORAL | 1 refills | Status: DC | PRN

## 2019-06-17 NOTE — Patient Instructions (Signed)
Mediterranean Diet A Mediterranean diet refers to food and lifestyle choices that are based on the traditions of countries located on the [employer]. This way of eating has been shown to help prevent certain conditions and improve outcomes for people who have chronic diseases, like kidney disease and heart disease. What are tips for following this plan? Lifestyle  Cook and eat meals together with your family, when possible.  Drink enough fluid to keep your urine clear or pale yellow.  Be physically active every day. This includes: ? Aerobic exercise like running or swimming. ? Leisure activities like gardening, walking, or housework.  Get 7-8 hours of sleep each night.  If recommended by your health care provider, drink red wine in moderation. This means 1 glass a day for nonpregnant women and 2 glasses a day for men. A glass of wine equals 5 oz (150 mL). Reading food labels   Check the serving size of packaged foods. For foods such as rice and pasta, the serving size refers to the amount of cooked product, not dry.  Check the total fat in packaged foods. Avoid foods that have saturated fat or trans fats.  Check the ingredients list for added sugars, such as corn syrup. Shopping  At the grocery store, buy most of your food from the areas near the walls of the store. This includes: ? Fresh fruits and vegetables (produce). ? Grains, beans, nuts, and seeds. Some of these may be available in unpackaged forms or large amounts (in bulk). ? Fresh seafood. ? Poultry and eggs. ? Low-fat dairy products.  Buy whole ingredients instead of prepackaged foods.  Buy fresh fruits and vegetables in-season from local farmers markets.  Buy frozen fruits and vegetables in resealable bags.  If you do not have access to quality fresh seafood, buy precooked frozen shrimp or canned fish, such as tuna, salmon, or sardines.  Buy small amounts of raw or cooked vegetables, salads, or olives from  the deli or salad bar at your store.  Stock your pantry so you always have certain foods on hand, such as olive oil, canned tuna, canned tomatoes, rice, pasta, and beans. Cooking  Cook foods with extra-virgin olive oil instead of using butter or other vegetable oils.  Have meat as a side dish, and have vegetables or grains as your main dish. This means having meat in small portions or adding small amounts of meat to foods like pasta or stew.  Use beans or vegetables instead of meat in common dishes like chili or lasagna.  Experiment with different cooking methods. Try roasting or broiling vegetables instead of steaming or sauteing them.  Add frozen vegetables to soups, stews, pasta, or rice.  Add nuts or seeds for added healthy fat at each meal. You can add these to yogurt, salads, or vegetable dishes.  Marinate fish or vegetables using olive oil, lemon juice, garlic, and fresh herbs. Meal planning   Plan to eat 1 vegetarian meal one day each week. Try to work up to 2 vegetarian meals, if possible.  Eat seafood 2 or more times a week.  Have healthy snacks readily available, such as: ? Vegetable sticks with hummus. ? [place] yogurt. ? Fruit and nut trail mix.  Eat balanced meals throughout the week. This includes: ? Fruit: 2-3 servings a day ? Vegetables: 4-5 servings a day ? Low-fat dairy: 2 servings a day ? Fish, poultry, or lean meat: 1 serving a day ? Beans and legumes: 2 or more servings a week ?  Nuts and seeds: 1-2 servings a day ? Whole grains: 6-8 servings a day ? Extra-virgin olive oil: 3-4 servings a day  Limit red meat and sweets to only a few servings a month What are my food choices?  Mediterranean diet ? Recommended  Grains: Whole-grain pasta. Brown rice. Bulgar wheat. Polenta. Couscous. Whole-wheat bread. Oatmeal. Quinoa.  Vegetables: Artichokes. Beets. Broccoli. Cabbage. Carrots. Eggplant. Green beans. Chard. Kale. Spinach. Onions. Leeks. Peas. Squash.  Tomatoes. Peppers. Radishes.  Fruits: Apples. Apricots. Avocado. Berries. Bananas. Cherries. Dates. Figs. Grapes. Lemons. Melon. Oranges. Peaches. Plums. Pomegranate.  Meats and other protein foods: Beans. Almonds. Sunflower seeds. Pine nuts. Peanuts. Cod. Salmon. Scallops. Shrimp. Tuna. Tilapia. Clams. Oysters. Eggs.  Dairy: Low-fat milk. Cheese. Greek yogurt.  Beverages: Water. Red wine. Herbal tea.  Fats and oils: Extra virgin olive oil. Avocado oil. Grape seed oil.  Sweets and desserts: Greek yogurt with honey. Baked apples. Poached pears. Trail mix.  Seasoning and other foods: Basil. Cilantro. Coriander. Cumin. Mint. Parsley. Sage. Rosemary. Tarragon. Garlic. Oregano. Thyme. Pepper. Balsalmic vinegar. Tahini. Hummus. Tomato sauce. Olives. Mushrooms. ? Limit these  Grains: Prepackaged pasta or rice dishes. Prepackaged cereal with added sugar.  Vegetables: Deep fried potatoes (french fries).  Fruits: Fruit canned in syrup.  Meats and other protein foods: Beef. Pork. Lamb. Poultry with skin. Hot dogs. Bacon.  Dairy: Ice cream. Sour cream. Whole milk.  Beverages: Juice. Sugar-sweetened soft drinks. Beer. Liquor and spirits.  Fats and oils: Butter. Canola oil. Vegetable oil. Beef fat (tallow). Lard.  Sweets and desserts: Cookies. Cakes. Pies. Candy.  Seasoning and other foods: Mayonnaise. Premade sauces and marinades. The items listed may not be a complete list. Talk with your dietitian about what dietary choices are right for you. Summary  The Mediterranean diet includes both food and lifestyle choices.  Eat a variety of fresh fruits and vegetables, beans, nuts, seeds, and whole grains.  Limit the amount of red meat and sweets that you eat.  Talk with your health care provider about whether it is safe for you to drink red wine in moderation. This means 1 glass a day for nonpregnant women and 2 glasses a day for men. A glass of wine equals 5 oz (150 mL). This information  is not intended to replace advice given to you by your health care provider. Make sure you discuss any questions you have with your health care provider. Document Released: 05/31/2016 Document Revised: 06/07/2016 Document Reviewed: 05/31/2016 Elsevier Patient Education  2020 Elsevier Inc.  

## 2019-06-18 DIAGNOSIS — K5730 Diverticulosis of large intestine without perforation or abscess without bleeding: Secondary | ICD-10-CM | POA: Diagnosis not present

## 2019-06-18 DIAGNOSIS — Z86010 Personal history of colonic polyps: Secondary | ICD-10-CM | POA: Diagnosis not present

## 2019-06-18 DIAGNOSIS — R159 Full incontinence of feces: Secondary | ICD-10-CM | POA: Diagnosis not present

## 2019-06-19 LAB — CMP14+EGFR
ALT: 14 IU/L (ref 0–32)
AST: 21 IU/L (ref 0–40)
Albumin/Globulin Ratio: 2 (ref 1.2–2.2)
Albumin: 4.8 g/dL (ref 3.8–4.8)
Alkaline Phosphatase: 100 IU/L (ref 39–117)
BUN/Creatinine Ratio: 19 (ref 12–28)
BUN: 18 mg/dL (ref 8–27)
Bilirubin Total: 0.4 mg/dL (ref 0.0–1.2)
CO2: 23 mmol/L (ref 20–29)
Calcium: 10.3 mg/dL (ref 8.7–10.3)
Chloride: 100 mmol/L (ref 96–106)
Creatinine, Ser: 0.94 mg/dL (ref 0.57–1.00)
GFR calc Af Amer: 72 mL/min/{1.73_m2} (ref >59)
GFR calc non Af Amer: 62 mL/min/{1.73_m2} (ref >59)
Globulin, Total: 2.4 g/dL (ref 1.5–4.5)
Glucose: 75 mg/dL (ref 65–99)
Potassium: 4.2 mmol/L (ref 3.5–5.2)
Sodium: 140 mmol/L (ref 134–144)
Total Protein: 7.2 g/dL (ref 6.0–8.5)

## 2019-06-19 LAB — URIC ACID: Uric Acid: 6.9 mg/dL (ref 2.5–7.1)

## 2019-06-19 LAB — LIPID PANEL
Chol/HDL Ratio: 2.5 ratio (ref 0.0–4.4)
Cholesterol, Total: 158 mg/dL (ref 100–199)
HDL: 64 mg/dL (ref >39)
LDL Calculated: 77 mg/dL (ref 0–99)
Triglycerides: 85 mg/dL (ref 0–149)
VLDL Cholesterol Cal: 17 mg/dL (ref 5–40)

## 2019-06-19 LAB — CYCLIC CITRUL PEPTIDE ANTIBODY, IGG/IGA: Cyclic Citrullin Peptide Ab: 6 units (ref 0–19)

## 2019-06-19 LAB — RHEUMATOID FACTOR: Rheumatoid fact SerPl-aCnc: <10 IU/mL (ref 0.0–13.9)

## 2019-06-19 LAB — ANTINUCLEAR ANTIBODIES, IFA: ANA Titer 1: NEGATIVE

## 2019-06-19 LAB — SEDIMENTATION RATE: Sed Rate: 42 mm/hr — ABNORMAL HIGH (ref 0–40)

## 2019-06-19 LAB — HEMOGLOBIN A1C
Est. average glucose Bld gHb Est-mCnc: 137 mg/dL
Hgb A1c MFr Bld: 6.4 % — ABNORMAL HIGH (ref 4.8–5.6)

## 2019-06-19 NOTE — Progress Notes (Signed)
Subjective:     Patient ID: [name] , female    DOB: [birth year] , 68 y.o.   MRN: [id number]   Chief Complaint  Patient presents with  . Hypertension  . Hyperlipidemia    HPI  Hypertension This is a chronic problem. The current episode started more than 1 year ago. The problem has been gradually improving since onset. The problem is controlled. Pertinent negatives include no blurred vision, chest pain, palpitations or shortness of breath. Risk factors for coronary artery disease include dyslipidemia, post-menopausal state, sedentary lifestyle and stress. Past treatments include beta blockers and diuretics. The current treatment provides moderate improvement.  Hyperlipidemia Pertinent negatives include no chest pain or shortness of breath.     Past Medical History:  Diagnosis Date  . Anxiety    takes Xanax prn  . Arthritis   . Diverticulosis   . H/O blood transfusion reaction 2010   hives  . H/O migraine    last 19yr ago  . Headache(784.0)   . Heart murmur    takes Bystolic nightly  . History of blood clots 481yr  was on Lovenox injections and Coumadin(was only on that for short period of time)  . History of shingles [id number]  . Hx of seasonal allergies    takes Levocetirizine prn  . Hyperlipidemia    takes Crestor nightly  . Hypertension    takes Maxzide daily  . Insomnia    takes Ambien prn  . Joint pain   . Joint swelling   . Trigeminal neuralgia of right side of face 03/26/2016   V2 distribution     Family History  Problem Relation Age of Onset  . Heart attack Mother   . Alzheimer's disease Mother   . Diabetes Sister   . Hypertension Sister      Current Outpatient Medications:  .  acetaminophen (TYLENOL) 500 MG tablet, Take 1,000 mg by mouth daily as needed for mild pain., Disp: , Rfl:  .  BYSTOLIC 5 MG tablet, TAKE 1 TABLET DAILY, Disp: 90 tablet, Rfl: 2 .  cholecalciferol (VITAMIN D) 1000 UNITS tablet, Take 5,000 Units by mouth daily. 5,000,  Disp: , Rfl:  .  clindamycin (CLEOCIN) 300 MG capsule, TK 2 CAPSULES PO NOW THEN TK 1 C PO Q 6 H UNTIL GONE, Disp: , Rfl:  .  cyclobenzaprine (FLEXERIL) 10 MG tablet, TK 1 T PO HS PRN, Disp: , Rfl:  .  diphenhydrAMINE (BENADRYL) 25 MG tablet, Take 25 mg by mouth at bedtime as needed for itching., Disp: , Rfl:  .  diphenhydrAMINE-Zinc Acetate (BENADRYL ITCH RELIEF EX), Apply 1 application topically 3 (three) times daily as needed (itching)., Disp: , Rfl:  .  docusate sodium (COLACE) 100 MG capsule, Take 1 capsule (100 mg total) by mouth 2 (two) times daily., Disp: 10 capsule, Rfl: 0 .  rosuvastatin (CRESTOR) 10 MG tablet, TAKE 1 TABLET DAILY, Disp: 90 tablet, Rfl: 3 .  triamterene-hydrochlorothiazide (DYAZIDE) 37.5-25 MG capsule, TAKE 1 CAPSULE DAILY, Disp: 90 capsule, Rfl: 2 .  zolpidem (AMBIEN) 5 MG tablet, Take 1 tablet (5 mg total) by mouth at bedtime as needed for sleep., Disp: 60 tablet, Rfl: 1 .  Carboxymethylcellulose Sodium (EYE DROPS OP), Apply 1 drop to eye 2 (two) times daily as needed (dry eyes)., Disp: , Rfl:  .  celecoxib (CELEBREX) 200 MG capsule, TK 1 C PO QD, Disp: , Rfl:  .  colchicine 0.6 MG tablet, Take 1 tablet (0.6 mg total) by mouth  daily for 10 days., Disp: 10 tablet, Rfl: 0   Allergies  Allergen Reactions  . Ibuprofen Hives and Swelling  . Penicillins Hives and Swelling    Has patient had a PCN reaction causing immediate rash, facial/tongue/throat swelling, SOB or lightheadedness with hypotension: Yes Has patient had a PCN reaction causing severe rash involving mucus membranes or skin necrosis: No Has patient had a PCN reaction that required hospitalization: No Has patient had a PCN reaction occurring within the last 10 years: No If all of the above answers are "NO", then may proceed with Cephalosporin use.      Review of Systems  Constitutional: Negative.   Eyes: Negative for blurred vision.  Respiratory: Negative.  Negative for shortness of breath.    Cardiovascular: Negative.  Negative for chest pain and palpitations.  Gastrointestinal: Negative.   Musculoskeletal: Positive for arthralgias.       She c/o generalized joint pains. She awakens with stiffness every morning.  She is unable to state what worsens her sx. She denies fever/chills.   Neurological: Negative.   Psychiatric/Behavioral: Positive for sleep disturbance.       She has h/o insomnia. She has tried several sleep meds, Lorrin Mais has been the most effective.      Today's Vitals   06/17/19 1142  BP: 110/68  Pulse: 61  Temp: (!) 97.2 F (36.2 C)  TempSrc: Oral  Weight: 158 lb (71.7 kg)  Height: 5' 0.6" (1.539 m)  PainSc: 0-No pain   Body mass index is 30.25 kg/m.   Objective:  Physical Exam Vitals signs and nursing note reviewed.  Constitutional:      Appearance: Normal appearance.  HENT:     Head: Normocephalic and atraumatic.  Cardiovascular:     Rate and Rhythm: Normal rate and regular rhythm.     Heart sounds: Normal heart sounds.  Pulmonary:     Effort: Pulmonary effort is normal.     Breath sounds: Normal breath sounds.  Skin:    General: Skin is warm.  Neurological:     General: No focal deficit present.     Mental Status: She is alert.  Psychiatric:        Mood and Affect: Mood normal.        Behavior: Behavior normal.         Assessment And Plan:     1. Essential hypertension  Chronic, well controlled.  She will continue with current meds. She is encouraged to avoid adding salt to her foods.   - CMP14+EGFR  2. Mixed hyperlipidemia  Chronic. She is encouraged to avoid fried foods, increase her fiber intake and to increase her daily activity as tolerated. She may also benefit from fiber supplementation.   - Lipid panel  3. Prediabetes  HER A1C HAS BEEN ELEVATED IN THE PAST. I WILL CHECK AN A1C, BMET TODAY. SHE WAS ENCOURAGED TO AVOID SUGARY BEVERAGES AND PROCESSED FOODS INCLUDNG BREADS, RICE AND PASTA.  - Hemoglobin A1c  4.  Primary insomnia  Chronic, she was given refill of zolpidem to use nightly as needed. Importance of good bedtime hygiene was also discussed with the patient.   5. Generalized joint pain  I will check an arthritis panel today. Patient advised her sx are likely due to osteoarthritis. I will make further recommendations once her labs are available for review. She is encouraged to follow an anti-inflammatory diet.   - ANA, IFA (with reflex) - CYCLIC CITRUL PEPTIDE ANTIBODY, IGG/IGA - Rheumatoid factor - Sedimentation rate -  Uric acid  6. Class 1 obesity due to excess calories with serious comorbidity and body mass index (BMI) of 30.0 to 30.9 in adult  She is encouraged to strive to lose 7 pounds to decrease cardiac risk.   7. Flu vaccine need  - Flu vaccine HIGH DOSE PF (Fluzone High dose)        [name], MD    THE PATIENT IS ENCOURAGED TO PRACTICE SOCIAL DISTANCING DUE TO THE COVID-19 PANDEMIC.

## 2019-07-06 DIAGNOSIS — H40023 Open angle with borderline findings, high risk, bilateral: Secondary | ICD-10-CM | POA: Diagnosis not present

## 2019-07-06 DIAGNOSIS — H25813 Combined forms of age-related cataract, bilateral: Secondary | ICD-10-CM | POA: Diagnosis not present

## 2019-07-10 DIAGNOSIS — I10 Essential (primary) hypertension: Secondary | ICD-10-CM | POA: Diagnosis not present

## 2019-07-10 DIAGNOSIS — Z20828 Contact with and (suspected) exposure to other viral communicable diseases: Secondary | ICD-10-CM | POA: Diagnosis not present

## 2019-07-10 DIAGNOSIS — E7800 Pure hypercholesterolemia, unspecified: Secondary | ICD-10-CM | POA: Diagnosis not present

## 2019-07-21 ENCOUNTER — Encounter: Payer: Medicare Other | Admitting: Sports Medicine

## 2019-07-23 HISTORY — PX: CATARACT EXTRACTION: SUR2

## 2019-07-29 ENCOUNTER — Other Ambulatory Visit: Payer: Medicare Other

## 2019-08-01 DIAGNOSIS — M25562 Pain in left knee: Secondary | ICD-10-CM | POA: Diagnosis not present

## 2019-08-03 ENCOUNTER — Other Ambulatory Visit: Payer: Medicare Other

## 2019-08-04 ENCOUNTER — Ambulatory Visit: Payer: Medicare Other | Admitting: Cardiology

## 2019-08-04 ENCOUNTER — Other Ambulatory Visit: Payer: Self-pay

## 2019-08-04 ENCOUNTER — Ambulatory Visit (INDEPENDENT_AMBULATORY_CARE_PROVIDER_SITE_OTHER): Payer: Medicare Other | Admitting: Sports Medicine

## 2019-08-04 ENCOUNTER — Encounter: Payer: Self-pay | Admitting: Sports Medicine

## 2019-08-04 DIAGNOSIS — Z09 Encounter for follow-up examination after completed treatment for conditions other than malignant neoplasm: Secondary | ICD-10-CM

## 2019-08-04 DIAGNOSIS — M76821 Posterior tibial tendinitis, right leg: Secondary | ICD-10-CM | POA: Diagnosis not present

## 2019-08-04 DIAGNOSIS — M79671 Pain in right foot: Secondary | ICD-10-CM

## 2019-08-04 NOTE — Progress Notes (Signed)
Subjective: [name] is a 68 y.o. female patient seen today in office for POV #4 DOS 05-11-19, S/P Silver bunionectomy on right. Patient denies pain at surgical site, reports a occassional shooting pain and a NEW pain at medial ankle x 2 weeks that started after trying to exercise, calf pain, denies headache, chest pain, shortness of breath, nausea, vomiting, fever, or chills. No other issues noted.   Patient Active Problem List   Diagnosis Date Noted  . Bunion of great toe of right foot 01/12/2019  . Cellulitis 12/23/2018  . Foot infection   . Great toe pain, right 12/17/2018  . Prediabetes 12/17/2018  . Cellulitis of foot 08/23/2017  . Hypokalemia 08/23/2017  . Trigeminal neuralgia of right side of face 03/26/2016  . Hyperlipidemia 12/04/2013  . Hypertension 12/04/2013  . Constipation 12/04/2013  . Insomnia 12/04/2013  . Acute blood loss anemia 12/04/2013  . Left hip pain 12/04/2013  . 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 acetaminophen (TYLENOL) 500 MG tablet Take 1,000 mg by mouth daily as needed for mild pain.    Marland Kitchen BYSTOLIC 5 MG tablet TAKE 1 TABLET DAILY 90 tablet 2  . Carboxymethylcellulose Sodium (EYE DROPS OP) Apply 1 drop to eye 2 (two) times daily as needed (dry eyes).    . celecoxib (CELEBREX) 200 MG capsule TK 1 C PO QD    . cholecalciferol (VITAMIN D) 1000 UNITS tablet Take 5,000 Units by mouth daily. 5,000    . cyclobenzaprine (FLEXERIL) 10 MG tablet TK 1 T PO HS PRN    . diphenhydrAMINE (BENADRYL) 25 MG tablet Take 25 mg by mouth at bedtime as needed for itching.    . diphenhydrAMINE-Zinc Acetate (BENADRYL ITCH RELIEF EX) Apply 1 application topically 3 (three) times daily as needed (itching).    Marland Kitchen docusate sodium (COLACE) 100 MG capsule Take 1 capsule (100 mg total) by mouth 2 (two) times daily. 10 capsule 0  .  ofloxacin (OCUFLOX) 0.3 % ophthalmic solution INT 1 GTT IN OS QID STARTING 1 DAY B SURGERY    . rosuvastatin (CRESTOR) 10 MG tablet TAKE 1 TABLET DAILY 90 tablet 3  . triamterene-hydrochlorothiazide (DYAZIDE) 37.5-25 MG capsule TAKE 1 CAPSULE DAILY 90 capsule 2  . zolpidem (AMBIEN) 5 MG tablet Take 1 tablet (5 mg total) by mouth at bedtime as needed for sleep. 60 tablet 1  . colchicine 0.6 MG tablet Take 1 tablet (0.6 mg total) by mouth daily for 10 days. 10 tablet 0   No current facility-administered medications on file prior to visit.     Allergies  Allergen Reactions  . Ibuprofen Hives and Swelling  . Penicillins Hives and Swelling    Has patient had a PCN reaction causing immediate rash, facial/tongue/throat swelling, SOB or lightheadedness with hypotension: Yes Has patient had a PCN reaction causing severe rash involving mucus membranes or skin necrosis: No Has patient had a PCN reaction that required hospitalization: No Has patient had a PCN reaction occurring within the last 10 years: No If all of the above answers are "NO", then may proceed with Cephalosporin use.     Objective: There were no vitals filed for this visit.  General: No acute distress, AAOx3  Right foot: Incision well healed with no gapping or dehiscence at surgical site, dry skin, mild swelling to right foot, + pain at  posterior tibial tendon course, no erythema, no warmth, no drainage, no signs of infection noted, Capillary fill time <3 seconds in all digits, gross sensation present via light touch to right foot.  Mild guarding with range of motion on right.  No pain with calf compression.   Assessment and Plan:  Problem List Items Addressed This Visit    None    Visit Diagnoses    Posterior tibial tendonitis of right leg    -  Primary   Right foot pain       Surgery follow-up         -Patient seen and evaluated -Discuss treatment for new tendonitis  -Continue with normal shoe and dispensed arch support  for ankle gaunlet to use on the right  -If no better patient to return to office for steriod shot   -Advised patient to limit activity to tolerance  -Advised patient to ice and elevate as necessary  -Will plan for re-check of surgical foot and tendonitis. If continues to hurt will xray at next visit. In the meantime, patient to call office if any issues or problems arise.   Landis Martins, DPM

## 2019-08-07 ENCOUNTER — Ambulatory Visit: Payer: Medicare Other | Admitting: Cardiology

## 2019-08-07 DIAGNOSIS — H25812 Combined forms of age-related cataract, left eye: Secondary | ICD-10-CM | POA: Diagnosis not present

## 2019-08-11 ENCOUNTER — Encounter: Payer: Medicare Other | Admitting: Sports Medicine

## 2019-09-01 ENCOUNTER — Ambulatory Visit (INDEPENDENT_AMBULATORY_CARE_PROVIDER_SITE_OTHER): Payer: Medicare Other | Admitting: Sports Medicine

## 2019-09-01 ENCOUNTER — Ambulatory Visit (INDEPENDENT_AMBULATORY_CARE_PROVIDER_SITE_OTHER): Payer: Medicare Other

## 2019-09-01 ENCOUNTER — Other Ambulatory Visit: Payer: Self-pay

## 2019-09-01 ENCOUNTER — Encounter: Payer: Self-pay | Admitting: Sports Medicine

## 2019-09-01 ENCOUNTER — Other Ambulatory Visit: Payer: Self-pay | Admitting: Sports Medicine

## 2019-09-01 DIAGNOSIS — M79671 Pain in right foot: Secondary | ICD-10-CM

## 2019-09-01 DIAGNOSIS — M779 Enthesopathy, unspecified: Secondary | ICD-10-CM

## 2019-09-01 DIAGNOSIS — M2011 Hallux valgus (acquired), right foot: Secondary | ICD-10-CM

## 2019-09-01 MED ORDER — TRIAMCINOLONE ACETONIDE 10 MG/ML IJ SUSP
10.0000 mg | Freq: Once | INTRAMUSCULAR | Status: AC
  Administered 2019-09-01: 10 mg

## 2019-09-01 NOTE — Progress Notes (Signed)
Subjective: [name] is a 68 y.o. female patient seen today in office for POV #5 DOS 05-11-19, S/P Silver bunionectomy on right. Patient reports that she is having increased pain over her surgical thigh as well as pain that is radiating up the top of the foot and ankle that did not improve even after taking steroids by mouth reports that her foot swelled up so bad last night slept with ice on swelling is now better but still pretty painful, patient denies calf pain, denies headache, chest pain, shortness of breath, nausea, vomiting, fever, or chills. No other issues noted.   Patient Active Problem List   Diagnosis Date Noted  . Bunion of great toe of right foot 01/12/2019  . Cellulitis 12/23/2018  . Foot infection   . Great toe pain, right 12/17/2018  . Prediabetes 12/17/2018  . Cellulitis of foot 08/23/2017  . Hypokalemia 08/23/2017  . Trigeminal neuralgia of right side of face 03/26/2016  . Hyperlipidemia 12/04/2013  . Hypertension 12/04/2013  . Constipation 12/04/2013  . Insomnia 12/04/2013  . Acute blood loss anemia 12/04/2013  . Left hip pain 12/04/2013  . 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 acetaminophen (TYLENOL) 500 MG tablet Take 1,000 mg by mouth daily as needed for mild pain.    Marland Kitchen BYSTOLIC 5 MG tablet TAKE 1 TABLET DAILY 90 tablet 2  . Carboxymethylcellulose Sodium (EYE DROPS OP) Apply 1 drop to eye 2 (two) times daily as needed (dry eyes).    . celecoxib (CELEBREX) 200 MG capsule TK 1 C PO QD    . cholecalciferol (VITAMIN D) 1000 UNITS tablet Take 5,000 Units by mouth daily. 5,000    . cyclobenzaprine (FLEXERIL) 10 MG tablet TK 1 T PO HS PRN    . diphenhydrAMINE (BENADRYL) 25 MG tablet Take 25 mg by mouth at bedtime as needed for itching.    . diphenhydrAMINE-Zinc Acetate (BENADRYL ITCH RELIEF EX) Apply 1 application  topically 3 (three) times daily as needed (itching).    Marland Kitchen docusate sodium (COLACE) 100 MG capsule Take 1 capsule (100 mg total) by mouth 2 (two) times daily. 10 capsule 0  . ofloxacin (OCUFLOX) 0.3 % ophthalmic solution INT 1 GTT IN OS QID STARTING 1 DAY B SURGERY    . rosuvastatin (CRESTOR) 10 MG tablet TAKE 1 TABLET DAILY 90 tablet 3  . triamterene-hydrochlorothiazide (DYAZIDE) 37.5-25 MG capsule TAKE 1 CAPSULE DAILY 90 capsule 2  . zolpidem (AMBIEN) 5 MG tablet Take 1 tablet (5 mg total) by mouth at bedtime as needed for sleep. 60 tablet 1  . colchicine 0.6 MG tablet Take 1 tablet (0.6 mg total) by mouth daily for 10 days. 10 tablet 0   No current facility-administered medications on file prior to visit.     Allergies  Allergen Reactions  . Ibuprofen Hives and Swelling  . Penicillins Hives and Swelling    Has patient had a PCN reaction causing immediate rash, facial/tongue/throat swelling, SOB or lightheadedness with hypotension: Yes Has patient had a PCN reaction causing severe rash involving mucus membranes or skin necrosis: No Has patient had a PCN reaction that required hospitalization: No Has patient had a PCN reaction occurring within the last 10 years: No If all of the above answers are "NO", then may proceed with Cephalosporin use.     Objective: There were no vitals  filed for this visit.  General: No acute distress, AAOx3  Right foot: Incision well healed with no gapping or dehiscence at surgical site, dry skin, mild swelling to right foot, + pain at posterior tibial tendon course, no erythema, no warmth, no drainage, no signs of infection noted, Capillary fill time <3 seconds in all digits, gross sensation present via light touch to right foot.  Mild guarding with range of motion on right and pain along the extensor hallucis longus tendon and lateral first metatarsophalangeal joint.  No pain with calf compression.   X-rays consistent with bunion deformity and pes  planus  Assessment and Plan:  Problem List Items Addressed This Visit    None    Visit Diagnoses    Tendonitis    -  Primary   Relevant Orders   DG Foot Complete Right   Capsulitis       Relevant Medications   triamcinolone acetonide (KENALOG) 10 MG/ML injection 10 mg (Start on 09/01/2019  1:00 PM)   Right foot pain       Hav (hallux abducto valgus), right         -Patient seen and evaluated -Xrays reviewed  -Discuss treatment for tendinitis capsulitis and continued pain extending from grade 4 bunion -Advised patient to discontinue home physical therapy or stretching exercises at this time -After oral consent and aseptic prep, injected a mixture containing 1 ml of 2% plain lidocaine, 1 ml 0.5% plain marcaine, 0.5 ml of kenalog 10 and 0.5 ml of dexamethasone phosphate into 1st MTPJ on right and along EHL tendon without complication. Post-injection care discussed with patient.  -Applied unna boot on right to keep intact for 5 days and continue with post op shoe for 1 week -Continue with rest, ice, elevation -Will plan for re-check of surgical foot and tendonitis. If continues to hurt will discuss possible surgery again on bunion. In the meantime, patient to call office if any issues or problems arise.   Landis Martins, DPM

## 2019-09-03 ENCOUNTER — Telehealth: Payer: Self-pay | Admitting: *Deleted

## 2019-09-03 NOTE — Telephone Encounter (Signed)
"  I'm a patient of Dr. Cannon Kettle.  I was in the office on Tuesday, this past Tuesday.  I'm ready to make preparations to have my surgery.  If you can, give me a call back.  I'd greatly appreciate it."  I called and left her a message that we will schedule her surgery date when she comes in for her appointment with Dr. Cannon Kettle.  "Okay, I'm scheduled to see her on December 1, I think. Thanks for calling me back."  Yes, you are scheduled for September 22, 2019.  We'll schedule your surgery then.

## 2019-09-03 NOTE — Telephone Encounter (Signed)
Patient is calling to request a prescription for pain medicine, was experiencing a lot of pain during the night and that the prescription  to be sent to [address].  She is now also ready to schedule for surgery.

## 2019-09-14 DIAGNOSIS — H209 Unspecified iridocyclitis: Secondary | ICD-10-CM | POA: Diagnosis not present

## 2019-09-14 DIAGNOSIS — H524 Presbyopia: Secondary | ICD-10-CM | POA: Diagnosis not present

## 2019-09-14 DIAGNOSIS — H5201 Hypermetropia, right eye: Secondary | ICD-10-CM | POA: Diagnosis not present

## 2019-09-14 DIAGNOSIS — H5212 Myopia, left eye: Secondary | ICD-10-CM | POA: Diagnosis not present

## 2019-09-14 DIAGNOSIS — Z961 Presence of intraocular lens: Secondary | ICD-10-CM | POA: Diagnosis not present

## 2019-09-14 DIAGNOSIS — H52202 Unspecified astigmatism, left eye: Secondary | ICD-10-CM | POA: Diagnosis not present

## 2019-09-15 DIAGNOSIS — M79671 Pain in right foot: Secondary | ICD-10-CM | POA: Diagnosis not present

## 2019-09-15 DIAGNOSIS — M76821 Posterior tibial tendinitis, right leg: Secondary | ICD-10-CM | POA: Diagnosis not present

## 2019-09-16 ENCOUNTER — Other Ambulatory Visit: Payer: Self-pay

## 2019-09-16 ENCOUNTER — Ambulatory Visit (INDEPENDENT_AMBULATORY_CARE_PROVIDER_SITE_OTHER): Payer: Medicare Other

## 2019-09-16 DIAGNOSIS — I272 Pulmonary hypertension, unspecified: Secondary | ICD-10-CM

## 2019-09-22 ENCOUNTER — Ambulatory Visit: Payer: Medicare Other | Admitting: Sports Medicine

## 2019-09-23 ENCOUNTER — Other Ambulatory Visit: Payer: Self-pay | Admitting: Internal Medicine

## 2019-09-24 IMAGING — CT CT ABD-PELV W/ CM
2 of 5 series · 16 of 46 positions shown, 18 images · IV contrast (ISOVUE)
Comparison: 07/16/2017

CLINICAL DATA: Right upper quadrant pain with nausea and. Dark
urine and back pain.

EXAM:
CT ABDOMEN AND PELVIS WITH CONTRAST
TECHNIQUE: Multidetector CT imaging of the abdomen and pelvis was performed
using the standard protocol following bolus administration of
intravenous contrast.
CONTRAST:  100mL [id number] IOPAMIDOL (L3MLLZ-6JJ) INJECTION 61%

[Series 2: axial st · axial · 0.70mm/px · z∈[+801,+1201]mm · 13 of 94 slices shown, 15 images]
[im 7/94  soft-tissue]
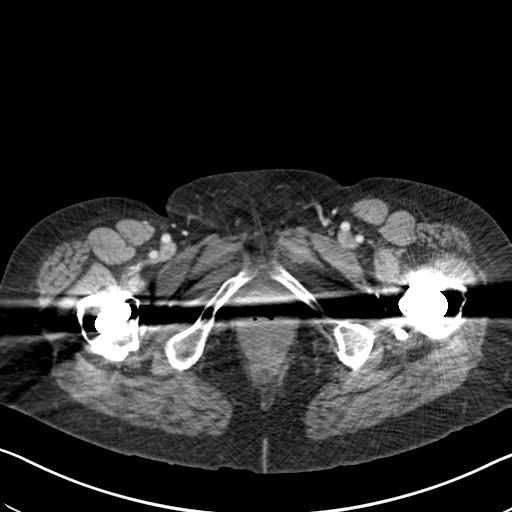
[im 7/94  bone]
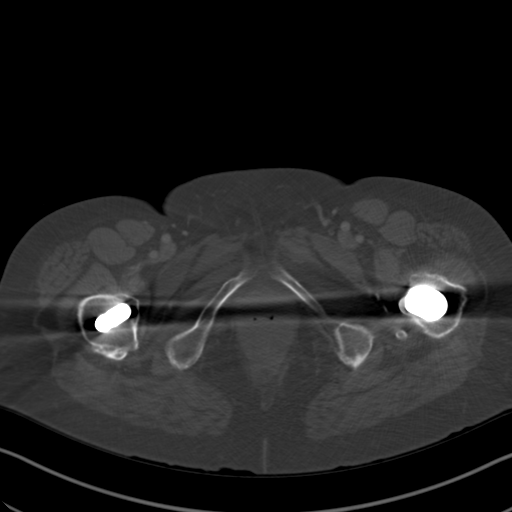
[im 14/94  soft-tissue]
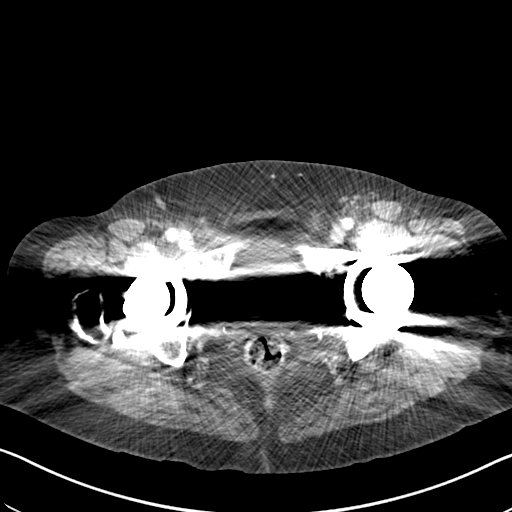
[im 20/94  soft-tissue]
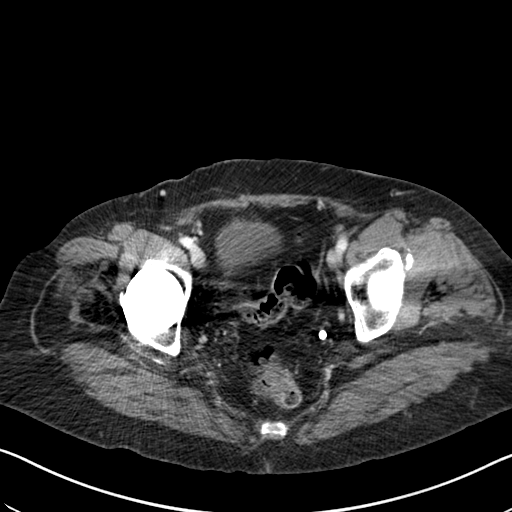
[im 27/94  soft-tissue]
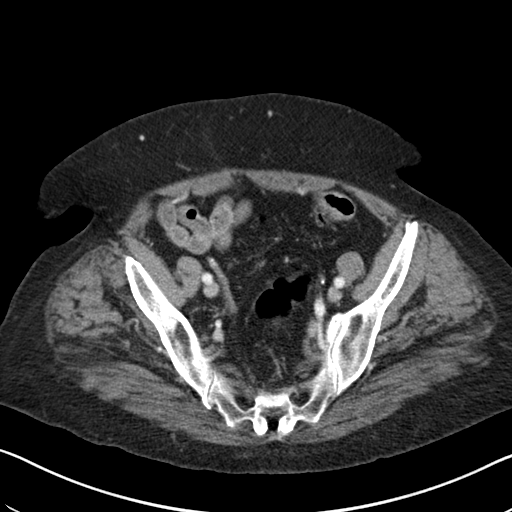
[im 34/94  soft-tissue]
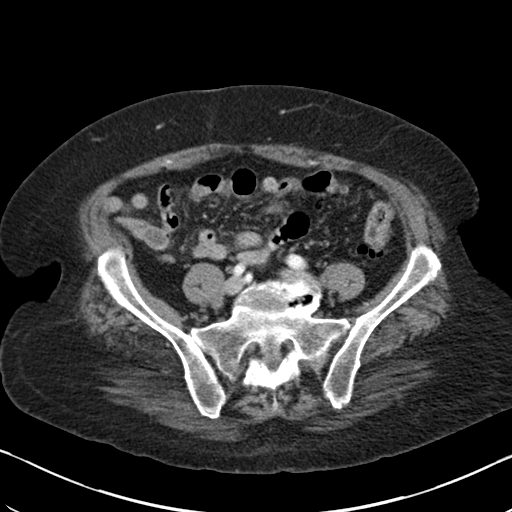
[im 40/94  soft-tissue]
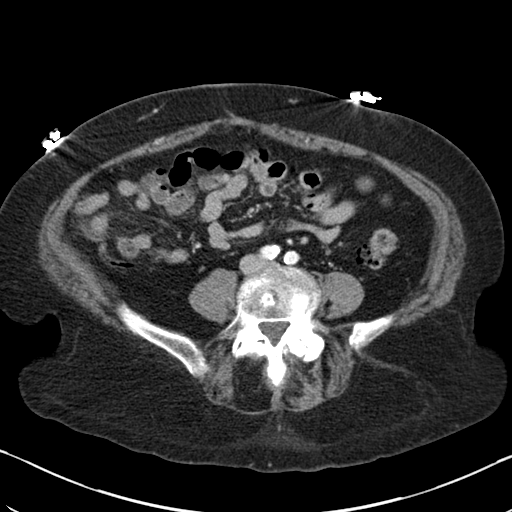
[im 47/94  soft-tissue]
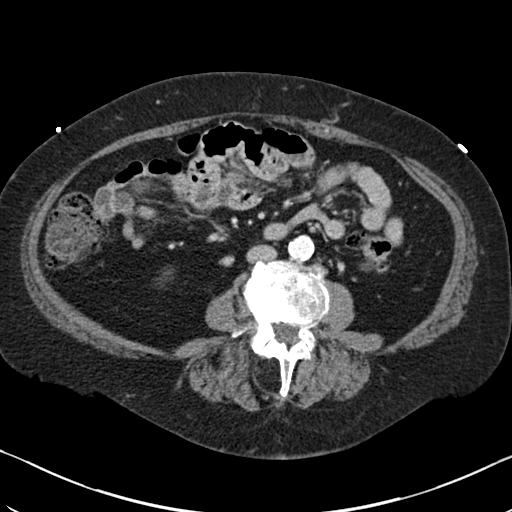
[im 54/94  soft-tissue]
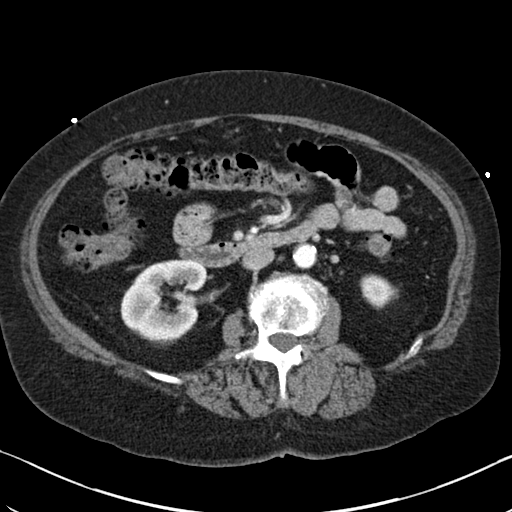
[im 60/94  soft-tissue]
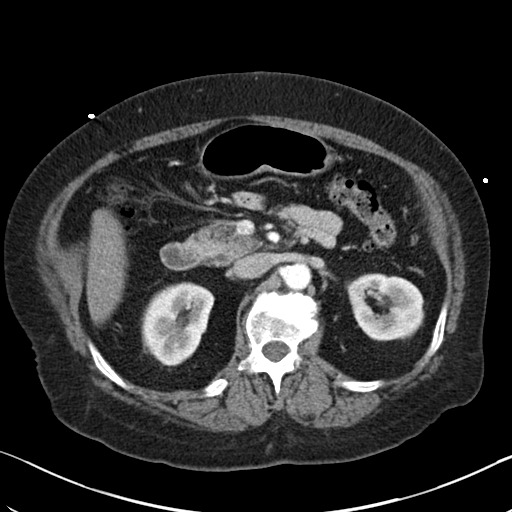
[im 60/94  bone]
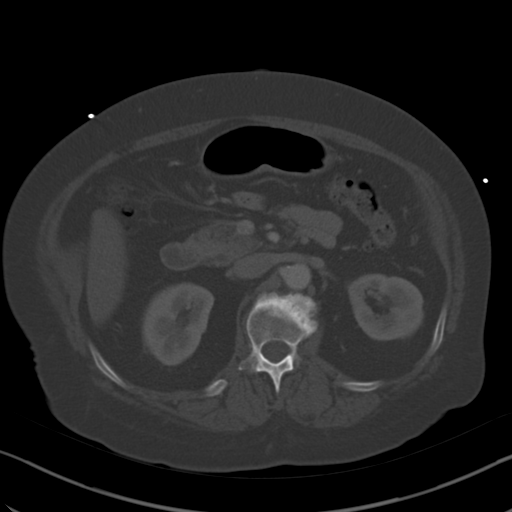
[im 67/94  soft-tissue]
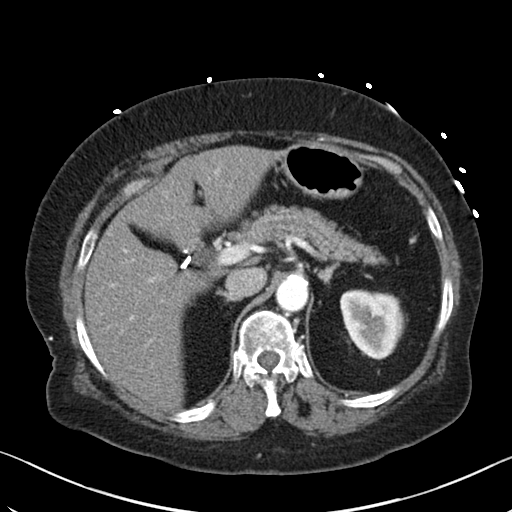
[im 74/94  soft-tissue]
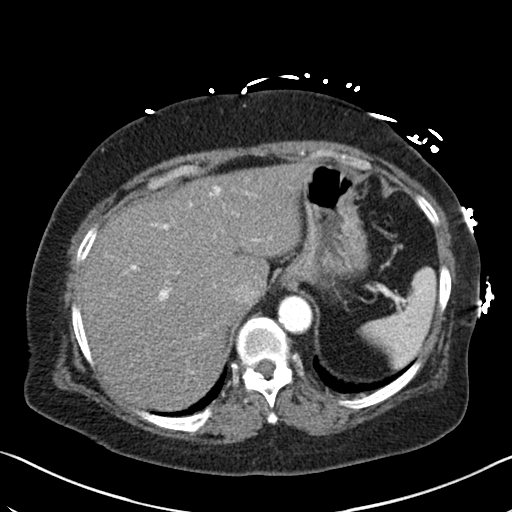
[im 80/94  soft-tissue]
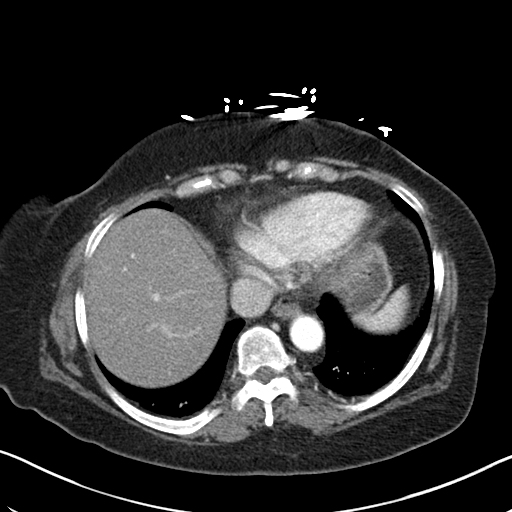
[im 87/94  soft-tissue]
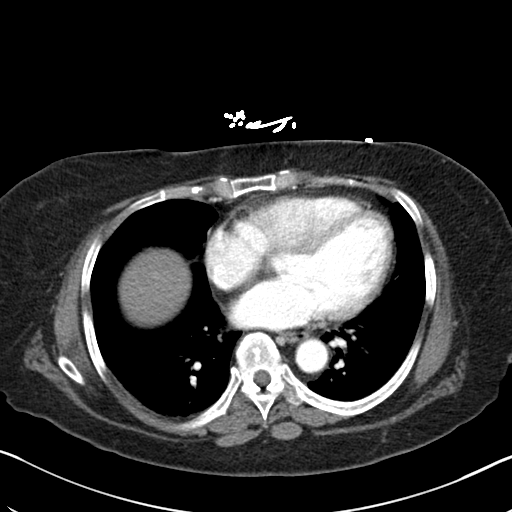

[Series 5: coronal st · coronal · 0.78mm/px · 3 of 101 slices shown]
[im 34/101  soft-tissue]
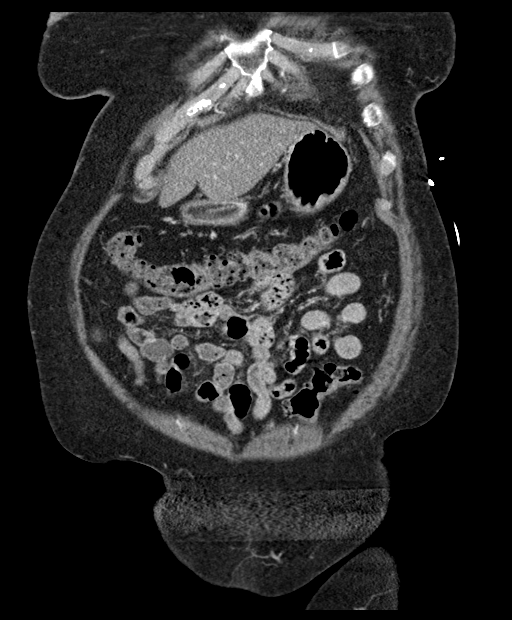
[im 45/101  soft-tissue]
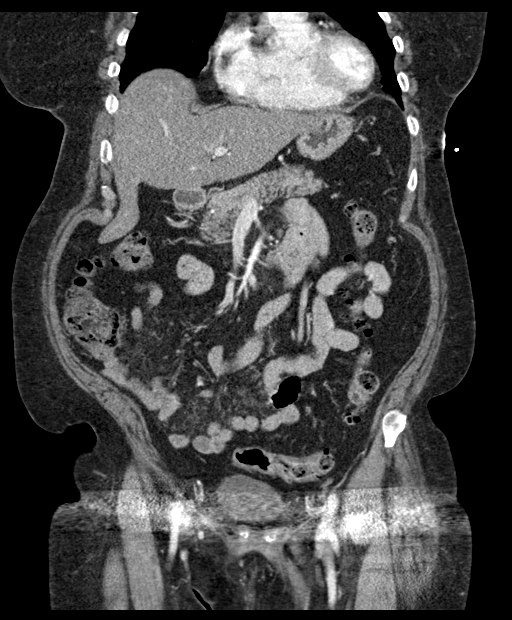
[im 56/101  soft-tissue]
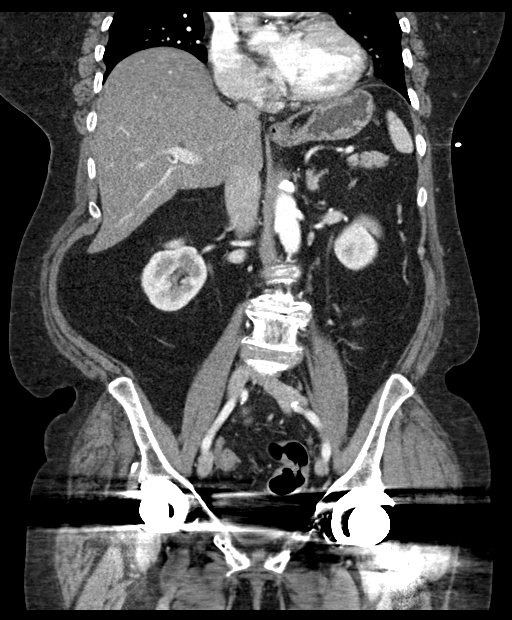

[16 of 46 positions shown; findings below may reference images not displayed]

FINDINGS: Lower chest: Mild cardiomegaly without pericardial effusion.
Dependent atelectasis at each lung base.

Hepatobiliary: Hepatic steatosis. Cholecystectomy. No enhancing mass
or biliary dilatation.

Pancreas: Normal

Spleen: Normal

Adrenals/Urinary Tract: Normal bilateral adrenal glands. Symmetric
cortical enhancement of both kidneys. No hydroureteronephrosis. The
urinary bladder is unremarkable for the degree of distention though
partially obscured by metallic streak artifacts from the patient's
hip arthroplasties.

Stomach/Bowel: Normal appendix. Scattered colonic diverticulosis
without acute diverticulitis. No bowel obstruction or inflammation.
The stomach and small intestine are nonacute.

Vascular/Lymphatic: Moderate aortoiliac atherosclerosis without
aneurysm. No lymphadenopathy by CT size criteria.

Reproductive: Hysterectomy.  No adnexal mass.

Other: No free air nor free fluid.

Musculoskeletal: Degenerative disc disease L1 through L4 most marked
at L3-4. Slight levoconvex curvature of the lumbar spine. Bilateral
hip arthroplasties. No aggressive osseous lesions. No acute
fracture.
IMPRESSION: 1. Colonic diverticulosis without acute diverticulitis. Normal
appendix.
2. Hepatic steatosis. Status post cholecystectomy. No
choledocholithiasis.
3. Symmetric enhancement of both kidneys without evidence of urinary
tract infection or pyelonephritis.

## 2019-09-30 ENCOUNTER — Telehealth: Payer: Self-pay

## 2019-09-30 DIAGNOSIS — M76821 Posterior tibial tendinitis, right leg: Secondary | ICD-10-CM | POA: Diagnosis not present

## 2019-09-30 NOTE — Telephone Encounter (Signed)
PT CALLED WANTING APPT FOR KNOT ON FOOT BUT PT STATED SHE WENT TO ORTHOPEDIC AND THEY HAVE TAKEN CARE OF THE ISSUE

## 2019-10-01 ENCOUNTER — Ambulatory Visit: Payer: Medicare Other | Admitting: Cardiology

## 2019-10-09 DIAGNOSIS — M25571 Pain in right ankle and joints of right foot: Secondary | ICD-10-CM | POA: Diagnosis not present

## 2019-10-12 ENCOUNTER — Other Ambulatory Visit: Payer: Self-pay | Admitting: Internal Medicine

## 2019-10-12 NOTE — Telephone Encounter (Signed)
Please refill patient's prescription 

## 2019-10-20 ENCOUNTER — Other Ambulatory Visit: Payer: Self-pay

## 2019-10-20 ENCOUNTER — Ambulatory Visit (INDEPENDENT_AMBULATORY_CARE_PROVIDER_SITE_OTHER): Payer: Medicare Other | Admitting: Cardiology

## 2019-10-20 ENCOUNTER — Encounter: Payer: Self-pay | Admitting: Cardiology

## 2019-10-20 VITALS — BP 95/62 | HR 82 | Ht 59.0 in | Wt 161.0 lb

## 2019-10-20 DIAGNOSIS — I071 Rheumatic tricuspid insufficiency: Secondary | ICD-10-CM | POA: Diagnosis not present

## 2019-10-20 DIAGNOSIS — I10 Essential (primary) hypertension: Secondary | ICD-10-CM

## 2019-10-20 DIAGNOSIS — E7800 Pure hypercholesterolemia, unspecified: Secondary | ICD-10-CM

## 2019-10-20 NOTE — Progress Notes (Signed)
Primary Physician/Referring:  Glendale Chard, MD  Patient ID: [name], female    DOB: 10/01/1951, 68 y.o.   MRN: [id number]  Chief Complaint  Patient presents with  . Hypertension  . Moderate tricuspid regurgitation   HPI:    [name]  is a 68 y.o. African-American female with  abnormal EKG suggesting cor pulmonale, mild pulmonary hypertension, essential hypertension, hyperlipidemia, prediabetes mellitus presents here for follow-up of valvular heart disease.  Previously a year ago, she had complained of mild dyspnea on exertion which she states is essentially asymptomatic and has not had any dyspnea.    No palpitations, no chest pain.  She has stopped water aerobics due to COVID-19.  She used to smoke cigarettes occasionally now has quit completely since Jan 2020.  Past Medical History:  Diagnosis Date  . Anxiety    takes Xanax prn  . Arthritis   . Cataract   . Diverticulosis   . H/O blood transfusion reaction 2010   hives  . H/O migraine    last 79yrs ago  . Headache(784.0)   . Heart murmur    takes Bystolic nightly  . History of blood clots 33yrs   was on Lovenox injections and Coumadin(was only on that for short period of time)  . History of shingles 3-89yrs ago  . Hx of seasonal allergies    takes Levocetirizine prn  . Hyperlipidemia    takes Crestor nightly  . Hypertension    takes Maxzide daily  . Insomnia    takes Ambien prn  . Joint pain   . Joint swelling   . Moderate tricuspid regurgitation   . Trigeminal neuralgia of right side of face 03/26/2016   V2 distribution   Past Surgical History:  Procedure Laterality Date  . ABDOMINAL HYSTERECTOMY    . BREAST BIOPSY  05/03/2006   [place]  . BREAST REDUCTION SURGERY  1987  . CHOLECYSTECTOMY    . COLONOSCOPY    . ESOPHAGOGASTRODUODENOSCOPY    . EYE SURGERY Right    eye lash removed  . JOINT REPLACEMENT     bilateral hip rt in 2007 and lt in 2010  . NM MYOCAR PERF WALL MOTION  11/04/2008   Normal  .  REDUCTION MAMMAPLASTY Bilateral 1988  . REDUCTION MAMMAPLASTY    . SHOULDER ARTHROSCOPY WITH DISTAL CLAVICLE RESECTION Right 09/21/2015   Procedure: RIGHT SHOULDER ARTHROSCOPY WITH DISTAL CLAVICLE EXCISION DEBRIDE LABRAL TEAR ACROMIOPLASTY ;  Surgeon: Frederik Pear, MD;  Location: [facility];  Service: Orthopedics;  Laterality: Right;  . TEE WITHOUT CARDIOVERSION N/A 04/12/2015   Procedure: TRANSESOPHAGEAL ECHOCARDIOGRAM (TEE)/BUBBLE STUDY;  Surgeon: Adrian Prows, MD;  Location: [facility];  Service: Cardiovascular;  Laterality: N/A;  . TOTAL HIP REVISION  04/14/2012   Procedure: TOTAL HIP REVISION;  Surgeon: Kerin Salen, MD;  Location: [facility];  Service: Orthopedics;  Laterality: Right;  right total hip revision  . TOTAL HIP REVISION Left 11/30/2013   Procedure: TOTAL HIP REVISION;  Surgeon: Kerin Salen, MD;  Location: [facility];  Service: Orthopedics;  Laterality: Left;   Social History   Socioeconomic History  . Marital status: Married    Spouse name: Not on file  . Number of children: 2  . Years of education: 74  . Highest education level: Not on file  Occupational History  . Occupation: Retired  Tobacco Use  . Smoking status: Passive Smoke Exposure - Never Smoker  . Smokeless tobacco: Never Used  Substance and Sexual  Activity  . Alcohol use: Not Currently  . Drug use: No  . Sexual activity: Not Currently    Birth control/protection: Surgical  Other Topics Concern  . Not on file  Social History Narrative   Lives at home w/ her husband   Right-handed   About 1 cup of coffee every other day   Social Determinants of Health   Financial Resource Strain: Low Risk   . Difficulty of Paying Living Expenses: Not hard at all  Food Insecurity: No Food Insecurity  . Worried About Charity fundraiser in the Last Year: Never true  . Ran Out of Food in the Last Year: Never true  Transportation Needs: No Transportation Needs  . Lack of Transportation (Medical): No  . Lack of  Transportation (Non-Medical): No  Physical Activity: Insufficiently Active  . Days of Exercise per Week: 3 days  . Minutes of Exercise per Session: 30 min  Stress: No Stress Concern Present  . Feeling of Stress : Not at all  Social Connections:   . Frequency of Communication with Friends and Family: Not on file  . Frequency of Social Gatherings with Friends and Family: Not on file  . Attends Religious Services: Not on file  . Active Member of Clubs or Organizations: Not on file  . Attends Archivist Meetings: Not on file  . Marital Status: Not on file  Intimate Partner Violence: Not At Risk  . Fear of Current or Ex-Partner: No  . Emotionally Abused: No  . Physically Abused: No  . Sexually Abused: No   ROS  Review of Systems  Constitution: Positive for decreased appetite. Negative for chills, malaise/fatigue and weight gain.  Cardiovascular: Negative for dyspnea on exertion, leg swelling and syncope.  Endocrine: Negative for cold intolerance.  Hematologic/Lymphatic: Does not bruise/bleed easily.  Musculoskeletal: Positive for joint pain. Negative for joint swelling.  Gastrointestinal: Negative for abdominal pain, anorexia, change in bowel habit, hematochezia and melena.  Neurological: Negative for headaches and light-headedness.  Psychiatric/Behavioral: Negative for depression and substance abuse.  All other systems reviewed and are negative.  Objective  Blood pressure 95/62, pulse 82, height 4\' 11"  (1.499 m), weight 161 lb (73 kg), SpO2 97 %.  Vitals with BMI 10/20/2019 06/17/2019 02/10/2019  Height 4\' 11"  5' 0.6" -  Weight 161 lbs 158 lbs -  BMI [id number] -  Systolic 95 [id number]  Diastolic 62 68 68  Pulse 82 61 87     Physical Exam  Constitutional:  She is moderately built and well-nourished in no acute distress  HENT:  Head: Atraumatic.  Eyes: Conjunctivae are normal.  Neck: No JVD present. No thyromegaly present.  Cardiovascular: Normal rate, regular rhythm,  normal heart sounds and intact distal pulses. Exam reveals no gallop.  No murmur heard. No leg edema, no JVD.  Pulmonary/Chest: Effort normal and breath sounds normal.  Abdominal: Soft. Bowel sounds are normal.  Musculoskeletal:        General: Normal range of motion.     Cervical back: Neck supple.  Neurological: She is alert.  Skin: Skin is warm and dry.  Psychiatric: She has a normal mood and affect.   Laboratory examination:   Recent Labs    12/23/18 1840 12/24/18 0521 02/11/19 1211 06/17/19 1208  NA 142 137 142 140  K 3.7 4.2 4.1 4.2  CL 106 106 106 100  CO2 26 23 25 23   GLUCOSE 96 143* 89 75  BUN 18 22 21 18   CREATININE  0.66 0.62 0.80 0.94  CALCIUM 9.6 9.3 9.8 10.3  GFRNONAA >60 >60  --  62  GFRAA >60 >60  --  72   CrCl cannot be calculated (Patient's most recent lab result is older than the maximum 21 days allowed.).  CMP Latest Ref Rng & Units 06/17/2019 02/11/2019 12/24/2018  Glucose 65 - 99 mg/dL 75 89 143(H)  BUN 8 - 27 mg/dL 18 21 22   Creatinine 0.57 - 1.00 mg/dL 0.94 0.80 0.62  Sodium 134 - 144 mmol/L 140 142 137  Potassium 3.5 - 5.2 mmol/L 4.2 4.1 4.2  Chloride 96 - 106 mmol/L 100 106 106  CO2 20 - 29 mmol/L 23 25 23   Calcium 8.7 - 10.3 mg/dL 10.3 9.8 9.3  Total Protein 6.0 - 8.5 g/dL 7.2 - -  Total Bilirubin 0.0 - 1.2 mg/dL 0.4 - -  Alkaline Phos 39 - 117 IU/L 100 - -  AST 0 - 40 IU/L 21 - -  ALT 0 - 32 IU/L 14 - -   CBC Latest Ref Rng & Units 02/11/2019 12/24/2018 12/23/2018  WBC 3.8 - 10.8 Thousand/uL 8.1 4.9 5.7  Hemoglobin 11.7 - 15.5 g/dL 13.4 12.1 13.1  Hematocrit 35.0 - 45.0 % 41.5 39.2 44.0  Platelets 140 - 400 Thousand/uL 255 280 263   Lipid Panel     Component Value Date/Time   CHOL 158 06/17/2019 1208   TRIG 85 06/17/2019 1208   HDL 64 06/17/2019 1208   CHOLHDL 2.5 06/17/2019 1208   CHOLHDL 1.7 10/21/2009 0135   VLDL 11 10/21/2009 0135   LDLCALC 77 06/17/2019 1208   HEMOGLOBIN A1C Lab Results  Component Value Date   HGBA1C 6.4 (H)  06/17/2019   MPG 131 08/24/2008   TSH Recent Labs    10/29/18 1304  TSH 1.810   Medications and allergies   Allergies  Allergen Reactions  . Ibuprofen Hives and Swelling  . Penicillins Hives and Swelling    Has patient had a PCN reaction causing immediate rash, facial/tongue/throat swelling, SOB or lightheadedness with hypotension: Yes Has patient had a PCN reaction causing severe rash involving mucus membranes or skin necrosis: No Has patient had a PCN reaction that required hospitalization: No Has patient had a PCN reaction occurring within the last 10 years: No If all of the above answers are "NO", then may proceed with Cephalosporin use.      Current Outpatient Medications  Medication Instructions  . acetaminophen (TYLENOL) 1,000 mg, Oral, Daily PRN  . AMBIEN 5 MG tablet TAKE 1 TABLET AT BEDTIME AS NEEDED FOR SLEEP  . BYSTOLIC 5 MG tablet TAKE 1 TABLET DAILY  . Carboxymethylcellulose Sodium (EYE DROPS OP) 1 drop, Ophthalmic, 2 times daily PRN  . cholecalciferol (VITAMIN D) 5,000 Units, Oral, Daily, 5,000  . diphenhydrAMINE (BENADRYL) 25 mg, Oral, At bedtime PRN  . diphenhydrAMINE-Zinc Acetate (BENADRYL ITCH RELIEF EX) 1 application, Apply externally, 3 times daily PRN  . docusate sodium (COLACE) 100 mg, Oral, 2 times daily  . rosuvastatin (CRESTOR) 10 MG tablet TAKE 1 TABLET DAILY  . triamterene-hydrochlorothiazide (DYAZIDE) 37.5-25 MG capsule TAKE 1 CAPSULE DAILY    Radiology:  No results found.  Cardiac Studies:   Lexiscan Myoview stress test 11/04/2008: Normal perfusion, no evidence of ischemia, EF 80%  Coronary Angiogram 2012 No stents placed  TEE 04/11/2016:  LV: Normal. Normal EF. RV: Normal LA: Normal. Left atrial appendage: Normal without thrombus. Normal function. Inter atrial septum is intact without defect. Double contrast study negative for atrial  level shunting. No late appearance of bubbles either. RA: Normal MV: Normal Trace MR. TV: Normal Trace  TR AV: Normal. No AI or AS. PV: Normal. Trace PI.  Echocardiogram 09/16/2019: Left ventricle cavity is normal in size. Mild concentric hypertrophy of the left ventricle. Normal LV systolic function with EF 55%. Normal global wall motion. Doppler evidence of grade I (impaired) diastolic dysfunction, normal LAP. Left atrial cavity is moderately dilated. Mild to moderate mitral regurgitation. Moderate tricuspid regurgitation. Estimated pulmonary artery systolic pressure is 39 mmHg.  Compared to previous study on 07/04/2017, estimated PASP increased form 31 mmHg to 39 mmHg.  Assessment     ICD-10-CM   1. Moderate tricuspid regurgitation  I07.1 EKG 12-Lead    EKG 10/20/2019: Normal sinus rhythm at rate of 70 bpm, right atrial enlargement, T wave abnormality, cannot exclude anterolateral ischemia.  May represent RV strain.  Low-voltage complexes.  Pulmonary disease pattern.  No significant change from 07/29/2018.  Recommendations:  No orders of the defined types were placed in this encounter.   [name]  is a 68 y.o. African-American female with  abnormal EKG suggesting cor pulmonale, mild pulmonary hypertension, essential hypertension, hyperlipidemia, prediabetes mellitus presents here for follow-up of valvular heart disease.  Previously a year ago, she had complained of mild dyspnea on exertion which she states is essentially asymptomatic and has not had any dyspnea. She quit smoking Jan 2020.   Although she has to moderate pulmonary hypertension by echocardiogram done recently and also presence of right ventricle is strain, she remains asymptomatic and since 2015 has not been any significant change in August systolic function or the tricuspid regurgitation.  She has also quit smoking which is important to recognize.  She remains clinically well compensated, but does need close follow-up.  I will see her back in one year.  She is advised to contact me if she starts noticing shortness of  breath, leg edema. In spite of moderate tricuspid regurgitation, careful auscultation does not reveal any heart murmur.  With regard to hypertension and hyperlipidemia, being managed by PCP Dr. Baird Cancer, well-controlled blood pressure and also lipids.  Adrian Prows, MD, [facility] 10/20/2019, 1:36 PM [facility] Pager: [phone] Office: [phone]

## 2019-11-05 ENCOUNTER — Ambulatory Visit
Admission: RE | Admit: 2019-11-05 | Discharge: 2019-11-05 | Disposition: A | Discharge status: Home / Self Care | Payer: Medicare Other | Source: Ambulatory Visit | Attending: Internal Medicine | Admitting: Internal Medicine

## 2019-11-05 ENCOUNTER — Other Ambulatory Visit: Payer: Self-pay

## 2019-11-05 ENCOUNTER — Ambulatory Visit: Payer: Medicare Other

## 2019-11-05 DIAGNOSIS — N644 Mastodynia: Secondary | ICD-10-CM

## 2019-11-11 DIAGNOSIS — M76821 Posterior tibial tendinitis, right leg: Secondary | ICD-10-CM | POA: Diagnosis not present

## 2019-11-12 ENCOUNTER — Ambulatory Visit: Payer: Medicare Other | Attending: Internal Medicine

## 2019-11-12 DIAGNOSIS — Z23 Encounter for immunization: Secondary | ICD-10-CM | POA: Insufficient documentation

## 2019-11-12 NOTE — Progress Notes (Signed)
   Covid-19 Vaccination Clinic  Name:  [name]    MRN: [id number] DOB: 1951/07/07  11/12/2019  [name] was observed post Covid-19 immunization for 15 minutes without incidence. She was provided with Vaccine Information Sheet and instruction to access the V-Safe system.   [name] was instructed to call 911 with any severe reactions post vaccine: Marland Kitchen Difficulty breathing  . Swelling of your face and throat  . A fast heartbeat  . A bad rash all over your body  . Dizziness and weakness    Immunizations Administered    Name Date Dose VIS Date Route   Pfizer COVID-19 Vaccine 11/12/2019  5:03 PM 0.3 mL 10/02/2019 Intramuscular   Manufacturer: [place]   Lot: [id number]   [place]: [id number]

## 2019-11-17 ENCOUNTER — Encounter: Payer: Self-pay | Admitting: Sports Medicine

## 2019-11-17 ENCOUNTER — Other Ambulatory Visit: Payer: Self-pay

## 2019-11-17 ENCOUNTER — Ambulatory Visit (INDEPENDENT_AMBULATORY_CARE_PROVIDER_SITE_OTHER): Payer: Medicare Other | Admitting: Sports Medicine

## 2019-11-17 VITALS — Temp 97.9°F

## 2019-11-17 DIAGNOSIS — M779 Enthesopathy, unspecified: Secondary | ICD-10-CM | POA: Diagnosis not present

## 2019-11-17 DIAGNOSIS — M79671 Pain in right foot: Secondary | ICD-10-CM

## 2019-11-17 DIAGNOSIS — M76821 Posterior tibial tendinitis, right leg: Secondary | ICD-10-CM

## 2019-11-17 DIAGNOSIS — M2011 Hallux valgus (acquired), right foot: Secondary | ICD-10-CM | POA: Diagnosis not present

## 2019-11-17 DIAGNOSIS — T148XXA Other injury of unspecified body region, initial encounter: Secondary | ICD-10-CM

## 2019-11-17 DIAGNOSIS — M25571 Pain in right ankle and joints of right foot: Secondary | ICD-10-CM

## 2019-11-17 MED ORDER — DICLOFENAC EPOLAMINE 1.3 % EX PTCH: 1.0000 | MEDICATED_PATCH | Freq: Two times a day (BID) | CUTANEOUS | 0 refills | Status: DC

## 2019-11-17 MED ORDER — PREGABALIN 75 MG PO CAPS: 75.0000 mg | ORAL_CAPSULE | Freq: Two times a day (BID) | ORAL | 1 refills | Status: DC

## 2019-11-17 NOTE — Progress Notes (Signed)
Subjective: [name] is a 69 y.o. female patient seen today in office for POV #5 DOS 05-11-19, S/P Silver bunionectomy on right. Patient reports that she is having increased pain over her surgical thigh as well as pain that is radiating up the top of the foot and ankle that did not improve even after taking steroids by mouth reports that her foot swelled up so bad last night slept with ice on swelling is now better but still pretty painful, patient denies calf pain, denies headache, chest pain, shortness of breath, nausea, vomiting, fever, or chills. No other issues noted.   Patient Active Problem List   Diagnosis Date Noted  . Moderate tricuspid regurgitation 10/20/2019  . Bunion of great toe of right foot 01/12/2019  . Cellulitis 12/23/2018  . Foot infection   . Great toe pain, right 12/17/2018  . Prediabetes 12/17/2018  . Cellulitis of foot 08/23/2017  . Hypokalemia 08/23/2017  . Trigeminal neuralgia of right side of face 03/26/2016  . Hyperlipidemia 12/04/2013  . Hypertension 12/04/2013  . Constipation 12/04/2013  . Insomnia 12/04/2013  . Acute blood loss anemia 12/04/2013  . Left hip pain 12/04/2013  . 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 Pain due to total hip replacement ([place]) 04/16/2012    Current Outpatient Medications on File Prior to Visit  Medication Sig Dispense Refill  . acetaminophen (TYLENOL) 500 MG tablet Take 1,000 mg by mouth daily as needed for mild pain.    . AMBIEN 5 MG tablet TAKE 1 TABLET AT BEDTIME AS NEEDED FOR SLEEP 60 tablet 1  . BYSTOLIC 5 MG tablet TAKE 1 TABLET DAILY 90 tablet 3  . Carboxymethylcellulose Sodium (EYE DROPS OP) Apply 1 drop to eye 2 (two) times daily as needed (dry eyes).    . cholecalciferol (VITAMIN D) 1000 UNITS tablet Take 5,000 Units by mouth daily. 5,000    . diphenhydrAMINE (BENADRYL) 25 MG tablet Take 25 mg by mouth at bedtime as needed for itching.    . diphenhydrAMINE-Zinc Acetate (BENADRYL ITCH RELIEF EX) Apply 1  application topically 3 (three) times daily as needed (itching).    Marland Kitchen docusate sodium (COLACE) 100 MG capsule Take 1 capsule (100 mg total) by mouth 2 (two) times daily. (Patient taking differently: Take 100 mg by mouth daily as needed. ) 10 capsule 0  . rosuvastatin (CRESTOR) 10 MG tablet TAKE 1 TABLET DAILY 90 tablet 3  . triamterene-hydrochlorothiazide (DYAZIDE) 37.5-25 MG capsule TAKE 1 CAPSULE DAILY 90 capsule 2   No current facility-administered medications on file prior to visit.    Allergies  Allergen Reactions  . Ibuprofen Hives and Swelling  . Penicillins Hives and Swelling    Has patient had a PCN reaction causing immediate rash, facial/tongue/throat swelling, SOB or lightheadedness with hypotension: Yes Has patient had a PCN reaction causing severe rash involving mucus membranes or skin necrosis: No Has patient had a PCN reaction that required hospitalization: No Has patient had a PCN reaction occurring within the last 10 years: No If all of the above answers are "NO", then may proceed with Cephalosporin use.     Objective: There were no vitals filed for this visit.  General: No acute distress, AAOx3  Right foot: Incision well healed with no gapping or dehiscence at surgical site, dry skin, mild swelling to right foot, + pain at posterior tibial tendon course, no erythema, no warmth, no drainage, no signs of infection noted, Capillary fill time <3 seconds in all digits, gross sensation present  via light touch to right foot.  Mild guarding with range of motion on right and pain along the extensor hallucis longus tendon and lateral first metatarsophalangeal joint.  No pain with calf compression.   X-rays consistent with bunion deformity and pes planus  Assessment and Plan:  Problem List Items Addressed This Visit    None    Visit Diagnoses    Tendon tear    -  Primary   Posterior tibial tendonitis of right leg       Capsulitis       Right foot pain       Hav (hallux  abducto valgus), right       Arthralgia of foot, right         -Patient seen and evaluated -Re-Discuss treatment for possible tear, capsulitis, and continued pain extending from grade 4 bunion -Previous MRI does not capture ankle or course of PT tendon on R; Re-ordered  MRI to further eval for tear -Rx Lyrica for sharp shooting pain  -Rx Flector patch to use at right medial foot/ankle for tendon pain -Dispensed trilock for patient to wear on right ankle to protect PT until after we can get MRI completed  -Continue with rest, ice, heat, elevation -Will plan for discussion of MRI results and possibility of more aggressive bunion surgery and tendon surgery. In the meantime, patient to call office if any issues or problems arise.   Landis Martins, DPM

## 2019-11-17 NOTE — Patient Instructions (Signed)
For instructions on how to put on your Tri-Lock Ankle Brace, please visit [email or web address] 

## 2019-11-18 ENCOUNTER — Telehealth: Payer: Self-pay | Admitting: *Deleted

## 2019-11-18 DIAGNOSIS — M76821 Posterior tibial tendinitis, right leg: Secondary | ICD-10-CM

## 2019-11-18 DIAGNOSIS — T148XXA Other injury of unspecified body region, initial encounter: Secondary | ICD-10-CM

## 2019-11-18 NOTE — Telephone Encounter (Signed)
-----   Message from [place], [place] sent at 11/17/2019  7:55 PM EST ----- Regarding: MRI R Ankle Continue pain worse that radiates up leg at medial ankle R/o tear at PT tendon

## 2019-11-18 NOTE — Telephone Encounter (Signed)
Orders faxed to [facility]. 

## 2019-11-21 ENCOUNTER — Ambulatory Visit
Admission: RE | Admit: 2019-11-21 | Discharge: 2019-11-21 | Disposition: A | Discharge status: Home / Self Care | Payer: Medicare Other | Source: Ambulatory Visit | Attending: Sports Medicine | Admitting: Sports Medicine

## 2019-11-21 ENCOUNTER — Other Ambulatory Visit: Payer: Self-pay

## 2019-11-21 DIAGNOSIS — R600 Localized edema: Secondary | ICD-10-CM | POA: Diagnosis not present

## 2019-11-21 DIAGNOSIS — T148XXA Other injury of unspecified body region, initial encounter: Secondary | ICD-10-CM

## 2019-11-21 DIAGNOSIS — M76821 Posterior tibial tendinitis, right leg: Secondary | ICD-10-CM

## 2019-11-26 ENCOUNTER — Other Ambulatory Visit: Payer: Self-pay

## 2019-11-26 ENCOUNTER — Ambulatory Visit (INDEPENDENT_AMBULATORY_CARE_PROVIDER_SITE_OTHER): Payer: Medicare Other | Admitting: Cardiology

## 2019-11-26 ENCOUNTER — Encounter: Payer: Self-pay | Admitting: Cardiology

## 2019-11-26 VITALS — BP 106/65 | HR 69 | Temp 97.6°F | Ht 60.0 in | Wt 169.5 lb

## 2019-11-26 DIAGNOSIS — R0789 Other chest pain: Secondary | ICD-10-CM

## 2019-11-26 DIAGNOSIS — I10 Essential (primary) hypertension: Secondary | ICD-10-CM

## 2019-11-26 NOTE — Progress Notes (Signed)
Primary Physician/Referring:  Glendale Chard, MD  Patient ID: [name], female    DOB: 1951/10/05, 69 y.o.   MRN: [id number]  Chief Complaint  Patient presents with  . Mitral Regurgitation  . Hypertension  . Hyperlipidemia  . Follow-up    c/o chest pain   HPI:    [name]  is a 69 y.o.  African-American female with  abnormal EKG suggesting cor pulmonale, prior tobacco use quit in 2000, mild pulmonary hypertension, essential hypertension, hyperlipidemia, prediabetes mellitus presents here for chest pain that started 1 week ago.   She developed chest pain that started about a week ago, described as sharp pains versus flinging discomfort that lasts few seconds in the middle of the chest and left upper chest area.  No other associated symptoms.  She got concerned about coronary artery disease and hence wanted to be evaluated on an urgent basis.  Denies dyspnea, no leg edema, no PND or orthopnea.  Tenderness in the left parasternal area at the costochondral junction.  Past Medical History:  Diagnosis Date  . Anxiety    takes Xanax prn  . Arthritis   . Cataract   . Diverticulosis   . H/O blood transfusion reaction 2010   hives  . H/O migraine    last 9yrs ago  . Headache(784.0)   . Heart murmur    takes Bystolic nightly  . History of blood clots 41yrs   was on Lovenox injections and Coumadin(was only on that for short period of time)  . History of shingles 3-42yrs ago  . Hx of seasonal allergies    takes Levocetirizine prn  . Hyperlipidemia    takes Crestor nightly  . Hypertension    takes Maxzide daily  . Insomnia    takes Ambien prn  . Joint pain   . Joint swelling   . Moderate tricuspid regurgitation   . Trigeminal neuralgia of right side of face 03/26/2016   V2 distribution   Past Surgical History:  Procedure Laterality Date  . ABDOMINAL HYSTERECTOMY    . BREAST BIOPSY  05/03/2006   [place]  . BREAST REDUCTION SURGERY  1987  . CHOLECYSTECTOMY    .  COLONOSCOPY    . ESOPHAGOGASTRODUODENOSCOPY    . EYE SURGERY Right    eye lash removed  . JOINT REPLACEMENT     bilateral hip rt in 2007 and lt in 2010  . NM MYOCAR PERF WALL MOTION  11/04/2008   Normal  . REDUCTION MAMMAPLASTY Bilateral 1988  . REDUCTION MAMMAPLASTY    . SHOULDER ARTHROSCOPY WITH DISTAL CLAVICLE RESECTION Right 09/21/2015   Procedure: RIGHT SHOULDER ARTHROSCOPY WITH DISTAL CLAVICLE EXCISION DEBRIDE LABRAL TEAR ACROMIOPLASTY ;  Surgeon: Frederik Pear, MD;  Location: [place];  Service: Orthopedics;  Laterality: Right;  . TEE WITHOUT CARDIOVERSION N/A 04/12/2015   Procedure: TRANSESOPHAGEAL ECHOCARDIOGRAM (TEE)/BUBBLE STUDY;  Surgeon: Adrian Prows, MD;  Location: [place];  Service: Cardiovascular;  Laterality: N/A;  . TOTAL HIP REVISION  04/14/2012   Procedure: TOTAL HIP REVISION;  Surgeon: Kerin Salen, MD;  Location: [place];  Service: Orthopedics;  Laterality: Right;  right total hip revision  . TOTAL HIP REVISION Left 11/30/2013   Procedure: TOTAL HIP REVISION;  Surgeon: Kerin Salen, MD;  Location: [place];  Service: Orthopedics;  Laterality: Left;   Social History   Socioeconomic History  . Marital status: Married    Spouse name: Not on file  . Number of children: 2  .  Years of education: 103  . Highest education level: Not on file  Occupational History  . Occupation: Retired  Tobacco Use  . Smoking status: Passive Smoke Exposure - Never Smoker  . Smokeless tobacco: Never Used  Substance and Sexual Activity  . Alcohol use: Not Currently  . Drug use: No  . Sexual activity: Not Currently    Birth control/protection: Surgical  Other Topics Concern  . Not on file  Social History Narrative   Lives at home w/ her husband   Right-handed   About 1 cup of coffee every other day   Social Determinants of Health   Financial Resource Strain: Low Risk   . Difficulty of Paying Living Expenses: Not hard at all  Food Insecurity: No Food Insecurity  .  Worried About Charity fundraiser in the Last Year: Never true  . Ran Out of Food in the Last Year: Never true  Transportation Needs: No Transportation Needs  . Lack of Transportation (Medical): No  . Lack of Transportation (Non-Medical): No  Physical Activity: Insufficiently Active  . Days of Exercise per Week: 3 days  . Minutes of Exercise per Session: 30 min  Stress: No Stress Concern Present  . Feeling of Stress : Not at all  Social Connections:   . Frequency of Communication with Friends and Family: Not on file  . Frequency of Social Gatherings with Friends and Family: Not on file  . Attends Religious Services: Not on file  . Active Member of Clubs or Organizations: Not on file  . Attends Archivist Meetings: Not on file  . Marital Status: Not on file  Intimate Partner Violence: Not At Risk  . Fear of Current or Ex-Partner: No  . Emotionally Abused: No  . Physically Abused: No  . Sexually Abused: No   ROS  Review of Systems  Constitution: Positive for weight gain.  Cardiovascular: Positive for chest pain. Negative for dyspnea on exertion, leg swelling and syncope.  Respiratory: Negative for hemoptysis.   Endocrine: Negative for cold intolerance.  Hematologic/Lymphatic: Does not bruise/bleed easily.  Musculoskeletal: Positive for joint pain.  Gastrointestinal: Negative for hematochezia and melena.  Neurological: Negative for light-headedness.   Objective  Blood pressure 106/65, pulse 69, temperature 97.6 F (36.4 C), height 5' (1.524 m), weight 169 lb 8 oz (76.9 kg), SpO2 98 %.  Vitals with BMI 11/26/2019 10/20/2019 06/17/2019  Height 5\' 0"  4\' 11"  5' 0.6"  Weight 169 lbs 8 oz 161 lbs 158 lbs  BMI 33.1 [id number]  Systolic [id number] 95 A999333  Diastolic 65 62 68  Pulse 69 82 61     Physical Exam  Constitutional: She appears well-nourished.  She is moderately built and well-nourished in no acute distress  Neck: No thyromegaly present.  Cardiovascular: Normal rate,  regular rhythm, normal heart sounds and intact distal pulses. Exam reveals no gallop.  No murmur heard. No leg edema, no JVD.  Pulmonary/Chest: Effort normal and breath sounds normal.  Tenderness in the left parasternal region at the costochondral junction.  Abdominal: Soft. Bowel sounds are normal.  Musculoskeletal:        General: Normal range of motion.   Laboratory examination:   Recent Labs    12/23/18 1840 12/23/18 1840 12/24/18 0521 02/11/19 1211 06/17/19 1208  NA 142   < > 137 142 140  K 3.7   < > 4.2 4.1 4.2  CL 106   < > 106 106 100  CO2 26   < >  23 25 23   GLUCOSE 96   < > 143* 89 75  BUN 18   < > 22 21 18   CREATININE 0.66   < > 0.62 0.80 0.94  CALCIUM 9.6   < > 9.3 9.8 10.3  GFRNONAA >60  --  >60  --  62  GFRAA >60  --  >60  --  72   < > = values in this interval not displayed.   CrCl cannot be calculated (Patient's most recent lab result is older than the maximum 21 days allowed.).  CMP Latest Ref Rng & Units 06/17/2019 02/11/2019 12/24/2018  Glucose 65 - 99 mg/dL 75 89 143(H)  BUN 8 - 27 mg/dL 18 21 22   Creatinine 0.57 - 1.00 mg/dL 0.94 0.80 0.62  Sodium 134 - 144 mmol/L 140 142 137  Potassium 3.5 - 5.2 mmol/L 4.2 4.1 4.2  Chloride 96 - 106 mmol/L 100 106 106  CO2 20 - 29 mmol/L 23 25 23   Calcium 8.7 - 10.3 mg/dL 10.3 9.8 9.3  Total Protein 6.0 - 8.5 g/dL 7.2 - -  Total Bilirubin 0.0 - 1.2 mg/dL 0.4 - -  Alkaline Phos 39 - 117 IU/L 100 - -  AST 0 - 40 IU/L 21 - -  ALT 0 - 32 IU/L 14 - -   CBC Latest Ref Rng & Units 02/11/2019 12/24/2018 12/23/2018  WBC 3.8 - 10.8 Thousand/uL 8.1 4.9 5.7  Hemoglobin 11.7 - 15.5 g/dL 13.4 12.1 13.1  Hematocrit 35.0 - 45.0 % 41.5 39.2 44.0  Platelets 140 - 400 Thousand/uL 255 280 263   Lipid Panel     Component Value Date/Time   CHOL 158 06/17/2019 1208   TRIG 85 06/17/2019 1208   HDL 64 06/17/2019 1208   CHOLHDL 2.5 06/17/2019 1208   CHOLHDL 1.7 10/21/2009 0135   VLDL 11 10/21/2009 0135   LDLCALC 77 06/17/2019 1208    HEMOGLOBIN A1C Lab Results  Component Value Date   HGBA1C 6.4 (H) 06/17/2019   MPG 131 08/24/2008   TSH No results for input(s): TSH in the last 8760 hours. Medications and allergies   Allergies  Allergen Reactions  . Ibuprofen Hives and Swelling  . Penicillins Hives and Swelling    Has patient had a PCN reaction causing immediate rash, facial/tongue/throat swelling, SOB or lightheadedness with hypotension: Yes Has patient had a PCN reaction causing severe rash involving mucus membranes or skin necrosis: No Has patient had a PCN reaction that required hospitalization: No Has patient had a PCN reaction occurring within the last 10 years: No If all of the above answers are "NO", then may proceed with Cephalosporin use.      Current Outpatient Medications  Medication Instructions  . acetaminophen (TYLENOL) 1,000 mg, Oral, Daily PRN  . AMBIEN 5 MG tablet TAKE 1 TABLET AT BEDTIME AS NEEDED FOR SLEEP  . BYSTOLIC 5 MG tablet TAKE 1 TABLET DAILY  . Carboxymethylcellulose Sodium (EYE DROPS OP) 1 drop, Ophthalmic, 2 times daily PRN  . cholecalciferol (VITAMIN D) 5,000 Units, Oral, Daily, 5,000  . diphenhydrAMINE (BENADRYL) 25 mg, Oral, At bedtime PRN  . diphenhydrAMINE-Zinc Acetate (BENADRYL ITCH RELIEF EX) 1 application, Apply externally, 3 times daily PRN  . docusate sodium (COLACE) 100 mg, Oral, 2 times daily  . pregabalin (LYRICA) 75 mg, Oral, 2 times daily  . rosuvastatin (CRESTOR) 10 MG tablet TAKE 1 TABLET DAILY  . triamterene-hydrochlorothiazide (DYAZIDE) 37.5-25 MG capsule TAKE 1 CAPSULE DAILY    Radiology:  No results found.  Cardiac Studies:   Lexiscan Myoview stress test 11/04/2008: Normal perfusion, no evidence of ischemia, EF 80%  Coronary Angiogram 2012 No stents placed  TEE 04/11/2016:  LV: Normal. Normal EF. RV: Normal LA: Normal. Left atrial appendage: Normal without thrombus. Normal function. Inter atrial septum is intact without defect. Double contrast  study negative for atrial level shunting. No late appearance of bubbles either. RA: Normal MV: Normal Trace MR. TV: Normal Trace TR AV: Normal. No AI or AS. PV: Normal. Trace PI.  Echocardiogram 09/16/2019: Left ventricle cavity is normal in size. Mild concentric hypertrophy of the left ventricle. Normal LV systolic function with EF 55%. Normal global wall motion. Doppler evidence of grade I (impaired) diastolic dysfunction, normal LAP. Left atrial cavity is moderately dilated. Mild to moderate mitral regurgitation. Moderate tricuspid regurgitation. Estimated pulmonary artery systolic pressure is 39 mmHg.  Compared to previous study on 07/04/2017, estimated PASP increased form 31 mmHg to 39 mmHg.  Assessment     ICD-10-CM   1. Chest pain of uncertain etiology  [id number]   2. Essential hypertension  I10 EKG 12-Lead    EKG 11/26/2019: Normal sinus rhythm with rate of 66 bpm, biatrial enlargement, normal axis.  Low-voltage complexes, nonspecific T abnormality.   No significant change from  EKG 10/20/2019.  Recommendations:  No orders of the defined types were placed in this encounter.   [name]  is a 69 y.o. African-American female with  abnormal EKG suggesting cor pulmonale, prior tobacco use quit in 2000, mild pulmonary hypertension, essential hypertension, hyperlipidemia, prediabetes mellitus presents here for chest pain that started 1 week ago.   Chest pain symptoms clearly appear to be musculoskeletal and she does have reproducible tenderness in the left costochondral junction area.  She will try Aleve for 2 days.  If she continues to have persistent discomfort I could certainly consider stress testing in view of hypertension, hyperlipidemia and prediabetes as risk factors and prior history of tobacco use.  I will see her back as previously scheduled in 8 to 10 months from now.  Adrian Prows, MD, [facility] 11/26/2019, 3:02 PM [facility]. [place] Pager: [phone] Office:  [phone]

## 2019-11-30 ENCOUNTER — Other Ambulatory Visit: Payer: Self-pay

## 2019-11-30 ENCOUNTER — Ambulatory Visit (HOSPITAL_COMMUNITY)
Admission: EM | Admit: 2019-11-30 | Discharge: 2019-11-30 | Disposition: A | Discharge status: Home / Self Care | Payer: Medicare Other | Attending: Physician Assistant | Admitting: Physician Assistant

## 2019-11-30 DIAGNOSIS — M79675 Pain in left toe(s): Secondary | ICD-10-CM | POA: Diagnosis not present

## 2019-11-30 MED ORDER — COLCHICINE 0.6 MG PO TABS: 0.6000 mg | ORAL_TABLET | Freq: Every day | ORAL | 0 refills | Status: DC

## 2019-11-30 MED ORDER — CLINDAMYCIN HCL 150 MG PO CAPS: 300.0000 mg | ORAL_CAPSULE | Freq: Four times a day (QID) | ORAL | 0 refills | Status: AC

## 2019-11-30 NOTE — ED Provider Notes (Signed)
[place]    CSN: [id number] Arrival date & time: 11/30/19  1456      History   Chief Complaint Chief Complaint  Patient presents with  . Foot Pain    Left    HPI [name] is a 69 y.o. female.   Patient reports to urgent care today for Left big toe pain and redness. The pain started on Saturday and has become worse since that time. She notes redness has expanded. It is painful to touch and pressure, such that wearing a shoe causes pain. She denies fever or chills. She denies trauma or injury to the toe.  She reports a history of similar issue in her right big toe last year, 12/2018. She was treated for both infection and gout at the time and there was concern for septic joint and osteomyelitis, however these were mostly ruled out. She responded well to clindamycin and colchicine at that time.   She has taken tylenol for the pain so far.     Past Medical History:  Diagnosis Date  . Anxiety    takes Xanax prn  . Arthritis   . Cataract   . Diverticulosis   . H/O blood transfusion reaction 2010   hives  . H/O migraine    last 47yrs ago  . Headache(784.0)   . Heart murmur    takes Bystolic nightly  . History of blood clots 73yrs   was on Lovenox injections and Coumadin(was only on that for short period of time)  . History of shingles 3-53yrs ago  . Hx of seasonal allergies    takes Levocetirizine prn  . Hyperlipidemia    takes Crestor nightly  . Hypertension    takes Maxzide daily  . Insomnia    takes Ambien prn  . Joint pain   . Joint swelling   . Moderate tricuspid regurgitation   . Trigeminal neuralgia of right side of face 03/26/2016   V2 distribution    Patient Active Problem List   Diagnosis Date Noted  . Moderate tricuspid regurgitation 10/20/2019  . Bunion of great toe of right foot 01/12/2019  . Cellulitis 12/23/2018  . Foot infection   . Great toe pain, right 12/17/2018  . Prediabetes 12/17/2018  . Cellulitis of foot 08/23/2017  .  Hypokalemia 08/23/2017  . Trigeminal neuralgia of right side of face 03/26/2016  . Hyperlipidemia 12/04/2013  . Hypertension 12/04/2013  . Constipation 12/04/2013  . Insomnia 12/04/2013  . Acute blood loss anemia 12/04/2013  . Left hip pain 12/04/2013  . 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 Pain due to total hip replacement ([place]) 04/16/2012    Past Surgical History:  Procedure Laterality Date  . ABDOMINAL HYSTERECTOMY    . BREAST BIOPSY  05/03/2006   [place]  . BREAST REDUCTION SURGERY  1987  . CHOLECYSTECTOMY    . COLONOSCOPY    . ESOPHAGOGASTRODUODENOSCOPY    . EYE SURGERY Right    eye lash removed  . JOINT REPLACEMENT     bilateral hip rt in 2007 and lt in 2010  . NM MYOCAR PERF WALL MOTION  11/04/2008   Normal  . REDUCTION MAMMAPLASTY Bilateral 1988  . REDUCTION MAMMAPLASTY    . SHOULDER ARTHROSCOPY WITH DISTAL CLAVICLE RESECTION Right 09/21/2015   Procedure: RIGHT SHOULDER ARTHROSCOPY WITH DISTAL CLAVICLE EXCISION DEBRIDE LABRAL TEAR ACROMIOPLASTY ;  Surgeon: Frederik Pear, MD;  Location: [place];  Service: Orthopedics;  Laterality: Right;  . TEE WITHOUT CARDIOVERSION N/A  04/12/2015   Procedure: TRANSESOPHAGEAL ECHOCARDIOGRAM (TEE)/BUBBLE STUDY;  Surgeon: Adrian Prows, MD;  Location: [place];  Service: Cardiovascular;  Laterality: N/A;  . TOTAL HIP REVISION  04/14/2012   Procedure: TOTAL HIP REVISION;  Surgeon: Kerin Salen, MD;  Location: [place];  Service: Orthopedics;  Laterality: Right;  right total hip revision  . TOTAL HIP REVISION Left 11/30/2013   Procedure: TOTAL HIP REVISION;  Surgeon: Kerin Salen, MD;  Location: [place];  Service: Orthopedics;  Laterality: Left;    OB History   No obstetric history on file.      Home Medications    Prior to Admission medications   Medication Sig Start Date End Date Taking? Authorizing Provider  acetaminophen (TYLENOL) 500 MG tablet Take 1,000 mg by mouth daily as needed for mild pain.    [provider]  AMBIEN 5 MG tablet TAKE 1 TABLET AT BEDTIME AS NEEDED FOR SLEEP 10/20/19   Glendale Chard, MD  BYSTOLIC 5 MG tablet TAKE 1 TABLET DAILY 09/23/19   Glendale Chard, MD  Carboxymethylcellulose Sodium (EYE DROPS OP) Apply 1 drop to eye 2 (two) times daily as needed (dry eyes).    [provider]  cholecalciferol (VITAMIN D) 1000 UNITS tablet Take 5,000 Units by mouth daily. 5,000    [provider]  clindamycin (CLEOCIN) 150 MG capsule Take 2 capsules (300 mg total) by mouth 4 (four) times daily for 7 days. 11/30/19 12/07/19  Heyden Jaber, Marguerita Beards, PA-C  colchicine 0.6 MG tablet Take 1 tablet (0.6 mg total) by mouth daily for 5 days. 11/30/19 12/05/19  Jazzlin Clements, Marguerita Beards, PA-C  diphenhydrAMINE (BENADRYL) 25 MG tablet Take 25 mg by mouth at bedtime as needed for itching.    [provider]  diphenhydrAMINE-Zinc Acetate (BENADRYL ITCH RELIEF EX) Apply 1 application topically 3 (three) times daily as needed (itching).    [provider]  docusate sodium (COLACE) 100 MG capsule Take 1 capsule (100 mg total) by mouth 2 (two) times daily. Patient taking differently: Take 100 mg by mouth daily as needed.  05/11/19   Landis Martins, DPM  pregabalin (LYRICA) 75 MG capsule Take 1 capsule (75 mg total) by mouth 2 (two) times daily. 11/17/19   Landis Martins, DPM  rosuvastatin (CRESTOR) 10 MG tablet TAKE 1 TABLET DAILY 06/01/19   Glendale Chard, MD  triamterene-hydrochlorothiazide [facility]) 37.5-25 MG capsule TAKE 1 CAPSULE DAILY 02/19/19   Glendale Chard, MD    Family History Family History  Problem Relation Age of Onset  . Heart attack Mother   . Alzheimer's disease Mother   . Diabetes Sister   . Hypertension Sister     Social History Social History   Tobacco Use  . Smoking status: Passive Smoke Exposure - Never Smoker  . Smokeless tobacco: Never Used  Substance Use Topics  . Alcohol use: Not Currently  . Drug use: No     Allergies   Ibuprofen and Penicillins   Review of  Systems Review of Systems  Constitutional: Negative for chills and fever.  HENT: Negative.   Eyes: Negative for pain and visual disturbance.  Respiratory: Negative for cough and shortness of breath.   Cardiovascular: Negative for chest pain and palpitations.  Gastrointestinal: Negative for abdominal pain, nausea and vomiting.  Musculoskeletal: Positive for arthralgias, gait problem and joint swelling. Negative for back pain and myalgias.  Skin: Positive for color change. Negative for rash and wound.  Neurological: Negative for seizures, syncope and numbness.  All other systems  reviewed and are negative.    Physical Exam Triage Vital Signs ED Triage Vitals  Enc Vitals Group     BP --      Pulse --      Resp --      Temp --      Temp src --      SpO2 --      Weight 11/30/19 1602 169 lb 6.4 oz (76.8 kg)     Height --      Head Circumference --      Peak Flow --      Pain Score 11/30/19 1601 7     Pain Loc --      Pain Edu? --      Excl. in [place]? --    No data found.  Updated Vital Signs BP 113/73 (BP Location: Left Arm)   Pulse (!) 57   Temp 98.5 F (36.9 C) (Oral)   Resp 18   Wt 169 lb 6.4 oz (76.8 kg)   SpO2 99%   BMI 33.08 kg/m   Visual Acuity Right Eye Distance:   Left Eye Distance:   Bilateral Distance:    Right Eye Near:   Left Eye Near:    Bilateral Near:     Physical Exam Vitals and nursing note reviewed.  Constitutional:      General: She is not in acute distress.    Appearance: Normal appearance. She is well-developed. She is not ill-appearing.  HENT:     Head: Normocephalic and atraumatic.  Eyes:     General: No scleral icterus.    Conjunctiva/sclera: Conjunctivae normal.     Pupils: Pupils are equal, round, and reactive to light.  Cardiovascular:     Rate and Rhythm: Normal rate.  Pulmonary:     Effort: Pulmonary effort is normal. No respiratory distress.  Musculoskeletal:     Right lower leg: No edema.     Left lower leg: No edema.      Comments: Left Great Toe hallux valgus deformity and with erythema from just proximal of MTP to nail bed. Edema in space between MTP and PIP. TTP over distal MTP and between MTP and PIP. No pain with ROM. Good distal sensation and cap Refill.   Skin:    General: Skin is warm and dry.  Neurological:     General: No focal deficit present.     Mental Status: She is alert and oriented to person, place, and time.     Gait: Gait abnormal (2/2 to toe pain).  Psychiatric:        Mood and Affect: Mood normal.        Behavior: Behavior normal.        Thought Content: Thought content normal.        Judgment: Judgment normal.      UC Treatments / Results  Labs (all labs ordered are listed, but only abnormal results are displayed) Labs Reviewed - No data to display  EKG   Radiology No results found.  Procedures Procedures (including critical care time)  Medications Ordered in UC Medications - No data to display  Initial Impression / Assessment and Plan / UC Course  I have reviewed the triage vital signs and the nursing notes.  Pertinent labs & imaging results that were available during my care of the patient were reviewed by me and considered in my medical decision making (see chart for details).     #Left great toe pain Unclear history of gout  in Right great toe in 12/2018. Admission at that time for concern of septic joint and/or osteo. Medicine team then believed gout more likely. Very similar presentation on chart review and with MD who saw patient in Urgent care in 12/2018 prior to ED referral. Given history of infectious vs gout, will treat accordingly. - clindamycin and colchicine sent - follow up precautions discussed - Case discussed and patient was seen with attending physician on shift familiar with patient and previous case.    Final Clinical Impressions(s) / UC Diagnoses   Final diagnoses:  Great toe pain, left     Discharge Instructions     Take the clindamycin  4 times a day for 7 days  Take 2 colchicine today, and the 1 daily until pain resolves. Follow up with your primary care if pain continues beyond 4-5 days  Take tylenol for pain  If not improving please return to clinic or follow up with primary care.      ED Prescriptions    Medication Sig Dispense Auth. Provider   clindamycin (CLEOCIN) 150 MG capsule Take 2 capsules (300 mg total) by mouth 4 (four) times daily for 7 days. 56 capsule Cameran Pettey, Marguerita Beards, PA-C   colchicine 0.6 MG tablet Take 1 tablet (0.6 mg total) by mouth daily for 5 days. 5 tablet Dominik Yordy, Marguerita Beards, PA-C     PDMP not reviewed this encounter.   Purnell Shoemaker, PA-C 12/01/19 0002

## 2019-11-30 NOTE — ED Triage Notes (Signed)
Pt is here with left foot pain that started yesterday. Pt has taken Tylenol arthritis.

## 2019-11-30 NOTE — Discharge Instructions (Signed)
Take the clindamycin 4 times a day for 7 days  Take 2 colchicine today, and the 1 daily until pain resolves. Follow up with your primary care if pain continues beyond 4-5 days  Take tylenol for pain  If not improving please return to clinic or follow up with primary care.

## 2019-12-04 ENCOUNTER — Ambulatory Visit: Payer: Medicare Other | Attending: Internal Medicine

## 2019-12-04 NOTE — Progress Notes (Unsigned)
   Covid-19 Vaccination Clinic  Name:  LINSEY KLUGMAN    MRN: [id number] DOB: July 16, 1951  12/04/2019  Ms. Porrata was observed post Covid-19 immunization for 15 minutes   without incidence. She was provided with Vaccine Information Sheet and instruction to access the V-Safe system.   Ms. Kitto was instructed to call 911 with any severe reactions post vaccine: Marland Kitchen Difficulty breathing  . Swelling of your face and throat  . A fast heartbeat  . A bad rash all over your body  . Dizziness and weakness

## 2019-12-18 DIAGNOSIS — M5126 Other intervertebral disc displacement, lumbar region: Secondary | ICD-10-CM | POA: Diagnosis not present

## 2019-12-18 DIAGNOSIS — M4316 Spondylolisthesis, lumbar region: Secondary | ICD-10-CM | POA: Diagnosis not present

## 2019-12-18 DIAGNOSIS — M47816 Spondylosis without myelopathy or radiculopathy, lumbar region: Secondary | ICD-10-CM | POA: Diagnosis not present

## 2019-12-18 DIAGNOSIS — M5136 Other intervertebral disc degeneration, lumbar region: Secondary | ICD-10-CM | POA: Diagnosis not present

## 2019-12-18 DIAGNOSIS — M545 Low back pain: Secondary | ICD-10-CM | POA: Diagnosis not present

## 2019-12-20 ENCOUNTER — Other Ambulatory Visit: Payer: Self-pay | Admitting: Internal Medicine

## 2019-12-24 ENCOUNTER — Other Ambulatory Visit: Payer: Self-pay

## 2019-12-24 ENCOUNTER — Ambulatory Visit (INDEPENDENT_AMBULATORY_CARE_PROVIDER_SITE_OTHER): Payer: Medicare Other | Admitting: Internal Medicine

## 2019-12-24 ENCOUNTER — Encounter: Payer: Self-pay | Admitting: Internal Medicine

## 2019-12-24 ENCOUNTER — Other Ambulatory Visit: Payer: Self-pay | Admitting: Internal Medicine

## 2019-12-24 ENCOUNTER — Ambulatory Visit (INDEPENDENT_AMBULATORY_CARE_PROVIDER_SITE_OTHER): Payer: Medicare Other

## 2019-12-24 VITALS — BP 118/62 | HR 88 | Temp 97.5°F | Ht 60.0 in | Wt 169.2 lb

## 2019-12-24 DIAGNOSIS — Z0000 Encounter for general adult medical examination without abnormal findings: Secondary | ICD-10-CM | POA: Diagnosis not present

## 2019-12-24 DIAGNOSIS — E6609 Other obesity due to excess calories: Secondary | ICD-10-CM | POA: Diagnosis not present

## 2019-12-24 DIAGNOSIS — I10 Essential (primary) hypertension: Secondary | ICD-10-CM

## 2019-12-24 DIAGNOSIS — M109 Gout, unspecified: Secondary | ICD-10-CM

## 2019-12-24 DIAGNOSIS — E7800 Pure hypercholesterolemia, unspecified: Secondary | ICD-10-CM | POA: Diagnosis not present

## 2019-12-24 DIAGNOSIS — Z6833 Body mass index (BMI) 33.0-33.9, adult: Secondary | ICD-10-CM | POA: Diagnosis not present

## 2019-12-24 DIAGNOSIS — R7303 Prediabetes: Secondary | ICD-10-CM

## 2019-12-24 LAB — POCT URINALYSIS DIPSTICK
Bilirubin, UA: NEGATIVE
Blood, UA: NEGATIVE
Glucose, UA: NEGATIVE
Ketones, UA: NEGATIVE
Leukocytes, UA: NEGATIVE
Nitrite, UA: NEGATIVE
Protein, UA: NEGATIVE
Spec Grav, UA: 1.02 (ref 1.010–1.025)
Urobilinogen, UA: 0.2 E.U./dL
pH, UA: 7 (ref 5.0–8.0)

## 2019-12-24 LAB — POCT UA - MICROALBUMIN
Albumin/Creatinine Ratio, Urine, POC: <30
Creatinine, POC: 200 mg/dL
Microalbumin Ur, POC: 10 mg/L

## 2019-12-24 NOTE — Addendum Note (Signed)
Addended by: Kellie Simmering on: 12/24/2019 04:49 PM   Modules accepted: Orders

## 2019-12-24 NOTE — Patient Instructions (Signed)
[name] , Thank you for taking time to come for your Medicare Wellness Visit. I appreciate your ongoing commitment to your health goals. Please review the following plan we discussed and let me know if I can assist you in the future.   Screening recommendations/referrals: Colonoscopy: 03/2017 Mammogram: 10/2019 Bone Density: 09/2015 Recommended yearly ophthalmology/optometry visit for glaucoma screening and checkup Recommended yearly dental visit for hygiene and checkup  Vaccinations: Influenza vaccine: 05/2019 Pneumococcal vaccine: 10/2017 Tdap vaccine: 08/2017 Shingles vaccine: discussed    Advanced directives: Advance directive discussed with you today. I have provided a copy for you to complete at home and have notarized. Once this is complete please bring a copy in to our office so we can scan it into your chart.   Conditions/risks identified: obesity  Next appointment:    Preventive Care 70 Years and Older, Female Preventive care refers to lifestyle choices and visits with your health care provider that can promote health and wellness. What does preventive care include?  A yearly physical exam. This is also called an annual well check.  Dental exams once or twice a year.  Routine eye exams. Ask your health care provider how often you should have your eyes checked.  Personal lifestyle choices, including:  Daily care of your teeth and gums.  Regular physical activity.  Eating a healthy diet.  Avoiding tobacco and drug use.  Limiting alcohol use.  Practicing safe sex.  Taking low-dose aspirin every day.  Taking vitamin and mineral supplements as recommended by your health care provider. What happens during an annual well check? The services and screenings done by your health care provider during your annual well check will depend on your age, overall health, lifestyle risk factors, and family history of disease. Counseling  Your health care provider may ask you  questions about your:  Alcohol use.  Tobacco use.  Drug use.  Emotional well-being.  Home and relationship well-being.  Sexual activity.  Eating habits.  History of falls.  Memory and ability to understand (cognition).  Work and work Statistician.  Reproductive health. Screening  You may have the following tests or measurements:  Height, weight, and BMI.  Blood pressure.  Lipid and cholesterol levels. These may be checked every 5 years, or more frequently if you are over 27 years old.  Skin check.  Lung cancer screening. You may have this screening every year starting at age 89 if you have a 30-pack-year history of smoking and currently smoke or have quit within the past 15 years.  Fecal occult blood test (FOBT) of the stool. You may have this test every year starting at age 51.  Flexible sigmoidoscopy or colonoscopy. You may have a sigmoidoscopy every 5 years or a colonoscopy every 10 years starting at age 27.  Hepatitis C blood test.  Hepatitis B blood test.  Sexually transmitted disease (STD) testing.  Diabetes screening. This is done by checking your blood sugar (glucose) after you have not eaten for a while (fasting). You may have this done every 1-3 years.  Bone density scan. This is done to screen for osteoporosis. You may have this done starting at age 64.  Mammogram. This may be done every 1-2 years. Talk to your health care provider about how often you should have regular mammograms. Talk with your health care provider about your test results, treatment options, and if necessary, the need for more tests. Vaccines  Your health care provider may recommend certain vaccines, such as:  Influenza vaccine.  This is recommended every year.  Tetanus, diphtheria, and acellular pertussis (Tdap, Td) vaccine. You may need a Td booster every 10 years.  Zoster vaccine. You may need this after age 50.  Pneumococcal 13-valent conjugate (PCV13) vaccine. One dose is  recommended after age 79.  Pneumococcal polysaccharide (PPSV23) vaccine. One dose is recommended after age 75. Talk to your health care provider about which screenings and vaccines you need and how often you need them. This information is not intended to replace advice given to you by your health care provider. Make sure you discuss any questions you have with your health care provider. Document Released: 11/04/2015 Document Revised: 06/27/2016 Document Reviewed: 08/09/2015 Elsevier Interactive Patient Education  2017 [employer] Prevention in the Home Falls can cause injuries. They can happen to people of all ages. There are many things you can do to make your home safe and to help prevent falls. What can I do on the outside of my home?  Regularly fix the edges of walkways and driveways and fix any cracks.  Remove anything that might make you trip as you walk through a door, such as a raised step or threshold.  Trim any bushes or trees on the path to your home.  Use bright outdoor lighting.  Clear any walking paths of anything that might make someone trip, such as rocks or tools.  Regularly check to see if handrails are loose or broken. Make sure that both sides of any steps have handrails.  Any raised decks and porches should have guardrails on the edges.  Have any leaves, snow, or ice cleared regularly.  Use sand or salt on walking paths during winter.  Clean up any spills in your garage right away. This includes oil or grease spills. What can I do in the bathroom?  Use night lights.  Install grab bars by the toilet and in the tub and shower. Do not use towel bars as grab bars.  Use non-skid mats or decals in the tub or shower.  If you need to sit down in the shower, use a plastic, non-slip stool.  Keep the floor dry. Clean up any water that spills on the floor as soon as it happens.  Remove soap buildup in the tub or shower regularly.  Attach bath mats  securely with double-sided non-slip rug tape.  Do not have throw rugs and other things on the floor that can make you trip. What can I do in the bedroom?  Use night lights.  Make sure that you have a light by your bed that is easy to reach.  Do not use any sheets or blankets that are too big for your bed. They should not hang down onto the floor.  Have a firm chair that has side arms. You can use this for support while you get dressed.  Do not have throw rugs and other things on the floor that can make you trip. What can I do in the kitchen?  Clean up any spills right away.  Avoid walking on wet floors.  Keep items that you use a lot in easy-to-reach places.  If you need to reach something above you, use a strong step stool that has a grab bar.  Keep electrical cords out of the way.  Do not use floor polish or wax that makes floors slippery. If you must use wax, use non-skid floor wax.  Do not have throw rugs and other things on the floor that can make  you trip. What can I do with my stairs?  Do not leave any items on the stairs.  Make sure that there are handrails on both sides of the stairs and use them. Fix handrails that are broken or loose. Make sure that handrails are as long as the stairways.  Check any carpeting to make sure that it is firmly attached to the stairs. Fix any carpet that is loose or worn.  Avoid having throw rugs at the top or bottom of the stairs. If you do have throw rugs, attach them to the floor with carpet tape.  Make sure that you have a light switch at the top of the stairs and the bottom of the stairs. If you do not have them, ask someone to add them for you. What else can I do to help prevent falls?  Wear shoes that:  Do not have high heels.  Have rubber bottoms.  Are comfortable and fit you well.  Are closed at the toe. Do not wear sandals.  If you use a stepladder:  Make sure that it is fully opened. Do not climb a closed  stepladder.  Make sure that both sides of the stepladder are locked into place.  Ask someone to hold it for you, if possible.  Clearly mark and make sure that you can see:  Any grab bars or handrails.  First and last steps.  Where the edge of each step is.  Use tools that help you move around (mobility aids) if they are needed. These include:  Canes.  Walkers.  Scooters.  Crutches.  Turn on the lights when you go into a dark area. Replace any light bulbs as soon as they burn out.  Set up your furniture so you have a clear path. Avoid moving your furniture around.  If any of your floors are uneven, fix them.  If there are any pets around you, be aware of where they are.  Review your medicines with your doctor. Some medicines can make you feel dizzy. This can increase your chance of falling. Ask your doctor what other things that you can do to help prevent falls. This information is not intended to replace advice given to you by your health care provider. Make sure you discuss any questions you have with your health care provider. Document Released: 08/04/2009 Document Revised: 03/15/2016 Document Reviewed: 11/12/2014 Elsevier Interactive Patient Education  2017 [employer].

## 2019-12-24 NOTE — Progress Notes (Signed)
This visit occurred during the SARS-CoV-2 public health emergency.  Safety protocols were in place, including screening questions prior to the visit, additional usage of staff PPE, and extensive cleaning of exam room while observing appropriate contact time as indicated for disinfecting solutions.  Subjective:     Patient ID: [name] , female    DOB: 01-22-1951 , 69 y.o.   MRN: [id number]   Chief Complaint  Patient presents with  . Hypertension    HPI  Hypertension This is a chronic problem. The current episode started more than 1 year ago. The problem has been gradually improving since onset. The problem is controlled. Pertinent negatives include no blurred vision. Risk factors for coronary artery disease include dyslipidemia, post-menopausal state, sedentary lifestyle and stress. Past treatments include beta blockers and diuretics. The current treatment provides moderate improvement.     Past Medical History:  Diagnosis Date  . Anxiety    takes Xanax prn  . Arthritis   . Cataract   . Diverticulosis   . H/O blood transfusion reaction 2010   hives  . H/O migraine    last 59yr ago  . Headache(784.0)   . Heart murmur    takes Bystolic nightly  . History of blood clots 474yr  was on Lovenox injections and Coumadin(was only on that for short period of time)  . History of shingles [id number]  . Hx of seasonal allergies    takes Levocetirizine prn  . Hyperlipidemia    takes Crestor nightly  . Hypertension    takes Maxzide daily  . Insomnia    takes Ambien prn  . Joint pain   . Joint swelling   . Moderate tricuspid regurgitation   . Trigeminal neuralgia of right side of face 03/26/2016   V2 distribution     Family History  Problem Relation Age of Onset  . Heart attack Mother   . Alzheimer's disease Mother   . Diabetes Sister   . Hypertension Sister      Current Outpatient Medications:  .  acetaminophen (TYLENOL) 500 MG tablet, Take 1,000 mg by mouth daily as  needed for mild pain., Disp: , Rfl:  .  AMBIEN 5 MG tablet, TAKE 1 TABLET AT BEDTIME AS NEEDED FOR SLEEP, Disp: 60 tablet, Rfl: 1 .  BYSTOLIC 5 MG tablet, TAKE 1 TABLET DAILY, Disp: 90 tablet, Rfl: 3 .  Carboxymethylcellulose Sodium (EYE DROPS OP), Apply 1 drop to eye 2 (two) times daily as needed (dry eyes)., Disp: , Rfl:  .  cholecalciferol (VITAMIN D) 1000 UNITS tablet, Take 5,000 Units by mouth daily. 5,000, Disp: , Rfl:  .  colchicine 0.6 MG tablet, Take 1 tablet (0.6 mg total) by mouth daily for 5 days., Disp: 5 tablet, Rfl: 0 .  diphenhydrAMINE (BENADRYL) 25 MG tablet, Take 25 mg by mouth at bedtime as needed for itching., Disp: , Rfl:  .  diphenhydrAMINE-Zinc Acetate (BENADRYL ITCH RELIEF EX), Apply 1 application topically 3 (three) times daily as needed (itching)., Disp: , Rfl:  .  docusate sodium (COLACE) 100 MG capsule, Take 1 capsule (100 mg total) by mouth 2 (two) times daily. (Patient taking differently: Take 100 mg by mouth daily as needed. ), Disp: 10 capsule, Rfl: 0 .  pregabalin (LYRICA) 75 MG capsule, Take 1 capsule (75 mg total) by mouth 2 (two) times daily. (Patient not taking: Reported on 12/24/2019), Disp: 30 capsule, Rfl: 1 .  rosuvastatin (CRESTOR) 10 MG tablet, TAKE 1 TABLET DAILY, Disp: 90 tablet,  Rfl: 3 .  triamterene-hydrochlorothiazide (DYAZIDE) 37.5-25 MG capsule, TAKE 1 CAPSULE DAILY, Disp: 90 capsule, Rfl: 3   Allergies  Allergen Reactions  . Ibuprofen Hives and Swelling  . Penicillins Hives and Swelling    Has patient had a PCN reaction causing immediate rash, facial/tongue/throat swelling, SOB or lightheadedness with hypotension: Yes Has patient had a PCN reaction causing severe rash involving mucus membranes or skin necrosis: No Has patient had a PCN reaction that required hospitalization: No Has patient had a PCN reaction occurring within the last 10 years: No If all of the above answers are "NO", then may proceed with Cephalosporin use.      Review of  Systems  Constitutional: Negative.   Eyes: Negative for blurred vision.  Respiratory: Negative.   Cardiovascular: Negative.   Gastrointestinal: Negative.   Musculoskeletal: Positive for arthralgias.       Has foot pain. Reports she was diagnosed with gout by her podiatrist.   Neurological: Negative.   Psychiatric/Behavioral: Negative.      Today's Vitals   12/24/19 1134  BP: 118/62  Pulse: 88  Temp: (!) 97.5 F (36.4 C)  TempSrc: Oral  Weight: 169 lb 3.2 oz (76.7 kg)  Height: 5' (1.524 m)  PainSc: 5   PainLoc: Back   Body mass index is 33.04 kg/m.   Wt Readings from Last 3 Encounters:  12/24/19 169 lb 3.2 oz (76.7 kg)  12/24/19 169 lb 3.2 oz (76.7 kg)  11/30/19 169 lb 6.4 oz (76.8 kg)     Objective:  Physical Exam Vitals and nursing note reviewed.  Constitutional:      Appearance: Normal appearance.  HENT:     Head: Normocephalic and atraumatic.  Cardiovascular:     Rate and Rhythm: Normal rate and regular rhythm.     Heart sounds: Normal heart sounds.  Pulmonary:     Effort: Pulmonary effort is normal.     Breath sounds: Normal breath sounds.  Skin:    General: Skin is warm.  Neurological:     General: No focal deficit present.     Mental Status: She is alert.  Psychiatric:        Mood and Affect: Mood normal.        Behavior: Behavior normal.         Assessment And Plan:     1. Essential hypertension  Chronic, well controlled.  She will continue with current meds. She is encouraged to avoid adding salt to her foods. I will check renal function today.   - CMP14+EGFR  2. Pure hypercholesterolemia  Chronic, I will check non-fasting lipid panel today. She is encouraged to continue with statin therapy and avoid fried foods when possible.   - Lipid panel  3. Prediabetes  HER A1C HAS BEEN ELEVATED IN THE PAST. I WILL CHECK AN A1C, BMET TODAY. SHE WAS ENCOURAGED TO AVOID SUGARY BEVERAGES AND PROCESSED FOODS INCLUDNG BREADS, RICE AND PASTA.  -  Hemoglobin A1c  4. Class 1 obesity due to excess calories with serious comorbidity and body mass index (BMI) of 33.0 to 33.9 in adult  She is encouraged to strive for BMI less than 30 to decrease cardiac risk. She is advised to increase daily activity as tolerated.   5. Gout of right foot, unspecified cause, unspecified chronicity  I will check uric acid level today. She is not taking any therapy at this time. She is currently on diuretic, will likely need to change her meds in the near future since diuretics  may exacerbate/cause gouty attacks   - Uric acid   [name], MD    THE PATIENT IS ENCOURAGED TO PRACTICE SOCIAL DISTANCING DUE TO THE COVID-19 PANDEMIC.

## 2019-12-24 NOTE — Progress Notes (Signed)
This visit occurred during the SARS-CoV-2 public health emergency.  Safety protocols were in place, including screening questions prior to the visit, additional usage of staff PPE, and extensive cleaning of exam room while observing appropriate contact time as indicated for disinfecting solutions.  Subjective:   [name] is a 69 y.o. female who presents for Medicare Annual (Subsequent) preventive examination.  Review of Systems:  n/a Cardiac Risk Factors include: advanced age (>61men, >16 women);hypertension;dyslipidemia;sedentary lifestyle;obesity (BMI >30kg/m2)     Objective:     Vitals: BP 118/62 (BP Location: Left Arm, Patient Position: Sitting, Cuff Size: Normal)   Pulse 88   Temp (!) 97.5 F (36.4 C) (Oral)   Ht 5' (1.524 m)   Wt 169 lb 3.2 oz (76.7 kg)   SpO2 96%   BMI 33.04 kg/m   Body mass index is 33.04 kg/m.  Advanced Directives 12/24/2019 12/23/2018 12/17/2018 09/17/2018 07/25/2018 05/10/2018 08/23/2017  Does Patient Have a Medical Advance Directive? No No No No No No No  Would patient like information on creating a medical advance directive? Yes (MAU/Ambulatory/Procedural Areas - Information given) No - Patient declined Yes (MAU/Ambulatory/Procedural Areas - Information given) - - No - Patient declined No - Patient declined  Pre-existing out of facility DNR order (yellow form or pink MOST form) - - - - - - -    Tobacco Social History   Tobacco Use  Smoking Status Passive Smoke Exposure - Never Smoker  Smokeless Tobacco Never Used     Counseling given: Not Answered   Clinical Intake:  Pre-visit preparation completed: Yes  Pain : 0-10 Pain Score: 5  Pain Type: Acute pain Pain Location: Back Pain Orientation: Lower Pain Descriptors / Indicators: Aching Pain Onset: 1 to 4 weeks ago Pain Frequency: Constant Pain Relieving Factors: muscle relaxer and Tylenol Arthritis  Pain Relieving Factors: muscle relaxer and Tylenol Arthritis  Nutritional Status: BMI  > 30  Obese Nutritional Risks: None Diabetes: No  How often do you need to have someone help you when you read instructions, pamphlets, or other written materials from your doctor or pharmacy?: 1 - Never What is the last grade level you completed in school?: some college  Interpreter Needed?: No  Information entered by :: NAllen LPN  Past Medical History:  Diagnosis Date  . Anxiety    takes Xanax prn  . Arthritis   . Cataract   . Diverticulosis   . H/O blood transfusion reaction 2010   hives  . H/O migraine    last 74yrs ago  . Headache(784.0)   . Heart murmur    takes Bystolic nightly  . History of blood clots 53yrs   was on Lovenox injections and Coumadin(was only on that for short period of time)  . History of shingles 3-87yrs ago  . Hx of seasonal allergies    takes Levocetirizine prn  . Hyperlipidemia    takes Crestor nightly  . Hypertension    takes Maxzide daily  . Insomnia    takes Ambien prn  . Joint pain   . Joint swelling   . Moderate tricuspid regurgitation   . Trigeminal neuralgia of right side of face 03/26/2016   V2 distribution   Past Surgical History:  Procedure Laterality Date  . ABDOMINAL HYSTERECTOMY    . BREAST BIOPSY  05/03/2006   [place]  . BREAST REDUCTION SURGERY  1987  . BUNIONECTOMY Right 05/11/2019  . CATARACT EXTRACTION Left 07/2019  . CHOLECYSTECTOMY    . COLONOSCOPY    .  ESOPHAGOGASTRODUODENOSCOPY    . EYE SURGERY Right    eye lash removed  . JOINT REPLACEMENT     bilateral hip rt in 2007 and lt in 2010  . NM MYOCAR PERF WALL MOTION  11/04/2008   Normal  . REDUCTION MAMMAPLASTY Bilateral 1988  . REDUCTION MAMMAPLASTY    . SHOULDER ARTHROSCOPY WITH DISTAL CLAVICLE RESECTION Right 09/21/2015   Procedure: RIGHT SHOULDER ARTHROSCOPY WITH DISTAL CLAVICLE EXCISION DEBRIDE LABRAL TEAR ACROMIOPLASTY ;  Surgeon: Frederik Pear, MD;  Location: [facility];  Service: Orthopedics;  Laterality: Right;  . TEE WITHOUT CARDIOVERSION N/A  04/12/2015   Procedure: TRANSESOPHAGEAL ECHOCARDIOGRAM (TEE)/BUBBLE STUDY;  Surgeon: Adrian Prows, MD;  Location: [facility];  Service: Cardiovascular;  Laterality: N/A;  . TOTAL HIP REVISION  04/14/2012   Procedure: TOTAL HIP REVISION;  Surgeon: Kerin Salen, MD;  Location: [facility];  Service: Orthopedics;  Laterality: Right;  right total hip revision  . TOTAL HIP REVISION Left 11/30/2013   Procedure: TOTAL HIP REVISION;  Surgeon: Kerin Salen, MD;  Location: [facility];  Service: Orthopedics;  Laterality: Left;   Family History  Problem Relation Age of Onset  . Heart attack Mother   . Alzheimer's disease Mother   . Diabetes Sister   . Hypertension Sister    Social History   Socioeconomic History  . Marital status: Married    Spouse name: Not on file  . Number of children: 2  . Years of education: 40  . Highest education level: Not on file  Occupational History  . Occupation: Retired  Tobacco Use  . Smoking status: Passive Smoke Exposure - Never Smoker  . Smokeless tobacco: Never Used  Substance and Sexual Activity  . Alcohol use: Not Currently  . Drug use: No  . Sexual activity: Not Currently    Birth control/protection: Surgical  Other Topics Concern  . Not on file  Social History Narrative   Lives at home w/ her husband   Right-handed   About 1 cup of coffee every other day   Social Determinants of Health   Financial Resource Strain: Low Risk   . Difficulty of Paying Living Expenses: Not hard at all  Food Insecurity: No Food Insecurity  . Worried About Charity fundraiser in the Last Year: Never true  . Ran Out of Food in the Last Year: Never true  Transportation Needs: No Transportation Needs  . Lack of Transportation (Medical): No  . Lack of Transportation (Non-Medical): No  Physical Activity: Inactive  . Days of Exercise per Week: 0 days  . Minutes of Exercise per Session: 0 min  Stress: Stress Concern Present  . Feeling of Stress : To some extent  Social  Connections:   . Frequency of Communication with Friends and Family: Not on file  . Frequency of Social Gatherings with Friends and Family: Not on file  . Attends Religious Services: Not on file  . Active Member of Clubs or Organizations: Not on file  . Attends Archivist Meetings: Not on file  . Marital Status: Not on file    Outpatient Encounter Medications as of 12/24/2019  Medication Sig  . acetaminophen (TYLENOL) 500 MG tablet Take 1,000 mg by mouth daily as needed for mild pain.  . AMBIEN 5 MG tablet TAKE 1 TABLET AT BEDTIME AS NEEDED FOR SLEEP  . BYSTOLIC 5 MG tablet TAKE 1 TABLET DAILY  . Carboxymethylcellulose Sodium (EYE DROPS OP) Apply 1 drop to eye 2 (two) times  daily as needed (dry eyes).  . cholecalciferol (VITAMIN D) 1000 UNITS tablet Take 5,000 Units by mouth daily. 5,000  . diphenhydrAMINE (BENADRYL) 25 MG tablet Take 25 mg by mouth at bedtime as needed for itching.  . diphenhydrAMINE-Zinc Acetate (BENADRYL ITCH RELIEF EX) Apply 1 application topically 3 (three) times daily as needed (itching).  Marland Kitchen docusate sodium (COLACE) 100 MG capsule Take 1 capsule (100 mg total) by mouth 2 (two) times daily. (Patient taking differently: Take 100 mg by mouth daily as needed. )  . rosuvastatin (CRESTOR) 10 MG tablet TAKE 1 TABLET DAILY  . triamterene-hydrochlorothiazide (DYAZIDE) 37.5-25 MG capsule TAKE 1 CAPSULE DAILY  . colchicine 0.6 MG tablet Take 1 tablet (0.6 mg total) by mouth daily for 5 days.  . pregabalin (LYRICA) 75 MG capsule Take 1 capsule (75 mg total) by mouth 2 (two) times daily. (Patient not taking: Reported on 12/24/2019)   No facility-administered encounter medications on file as of 12/24/2019.    Activities of Daily Living In your present state of health, do you have any difficulty performing the following activities: 12/24/2019  Hearing? N  Vision? N  Difficulty concentrating or making decisions? N  Walking or climbing stairs? N  Dressing or bathing? N    Doing errands, shopping? N  Preparing Food and eating ? N  Using the Toilet? N  In the past six months, have you accidently leaked urine? N  Do you have problems with loss of bowel control? N  Managing your Medications? N  Managing your Finances? N  Housekeeping or managing your Housekeeping? N  Some recent data might be hidden    Patient Care Team: Glendale Chard, MD as PCP - General (Internal Medicine) Katy Fitch, Darlina Guys, MD as Consulting Physician (Ophthalmology)    Assessment:   This is a routine wellness examination for Birdie.  Exercise Activities and Dietary recommendations Current Exercise Habits: The patient does not participate in regular exercise at present  Goals    . Patient Stated (pt-stated)     Plans to re join the gym    . Weight (lb) < 200 lb (90.7 kg)     12/24/2019, wants to get down to 140 pounds and start exercising again.       Fall Risk Fall Risk  12/24/2019 01/12/2019 12/17/2018 12/17/2018 11/11/2018  Falls in the past year? 0 0 0 0 0  Comment - - - - -  Risk for fall due to : Medication side effect - Medication side effect - -  Follow up Falls evaluation completed;Education provided;Falls prevention discussed - Education provided;Falls prevention discussed - -   Is the patient's home free of loose throw rugs in walkways, pet beds, electrical cords, etc?   yes      Grab bars in the bathroom? yes      Handrails on the stairs?   yes      Adequate lighting?   yes  Timed Get Up and Go performed: n/a  Depression Screen PHQ 2/9 Scores 12/24/2019 01/12/2019 12/17/2018 12/17/2018  PHQ - 2 Score 0 0 0 0  PHQ- 9 Score 0 - 0 -     Cognitive Function     6CIT Screen 12/24/2019 12/17/2018  What Year? 0 points 0 points  What month? 0 points 0 points  What time? 0 points 0 points  Count back from 20 0 points 0 points  Months in reverse 0 points 0 points  Repeat phrase 0 points 0 points  Total Score 0 0  Immunization History  Administered Date(s)  Administered  . Influenza, High Dose Seasonal PF 10/23/2017, 08/11/2018, 06/17/2019  . PFIZER SARS-COV-2 Vaccination 11/12/2019  . Pneumococcal Conjugate-13 11/08/2017  . Tdap 08/23/2017    Qualifies for Shingles Vaccine?yes  Screening Tests Health Maintenance  Topic Date Due  . PNA vac Low Risk Adult (2 of 2 - PPSV23) 11/08/2018  . MAMMOGRAM  01/01/2021  . COLONOSCOPY  04/09/2027  . TETANUS/TDAP  08/24/2027  . INFLUENZA VACCINE  Completed  . DEXA SCAN  Completed  . Hepatitis C Screening  Completed    Cancer Screenings: Lung: Low Dose CT Chest recommended if Age 30-80 years, 30 pack-year currently smoking OR have quit w/in 15years. Patient does not qualify. Breast:  Up to date on Mammogram? Yes   Up to date of Bone Density/Dexa? Yes Colorectal: up to date  Additional Screenings: : Hepatitis C Screening: 05/11/2018     Plan:    Patient wants to get down to 140 pounds and start exercising again.   I have personally reviewed and noted the following in the patient's chart:   . Medical and social history . Use of alcohol, tobacco or illicit drugs  . Current medications and supplements . Functional ability and status . Nutritional status . Physical activity . Advanced directives . List of other physicians . Hospitalizations, surgeries, and ER visits in previous 12 months . Vitals . Screenings to include cognitive, depression, and falls . Referrals and appointments  In addition, I have reviewed and discussed with patient certain preventive protocols, quality metrics, and best practice recommendations. A written personalized care plan for preventive services as well as general preventive health recommendations were provided to patient.     Kellie Simmering, LPN  [id number]

## 2019-12-25 LAB — LIPID PANEL
Chol/HDL Ratio: 2.3 ratio (ref 0.0–4.4)
Cholesterol, Total: 126 mg/dL (ref 100–199)
HDL: 55 mg/dL (ref >39)
LDL Chol Calc (NIH): 58 mg/dL (ref 0–99)
Triglycerides: 63 mg/dL (ref 0–149)
VLDL Cholesterol Cal: 13 mg/dL (ref 5–40)

## 2019-12-25 LAB — CMP14+EGFR
ALT: 15 IU/L (ref 0–32)
AST: 21 IU/L (ref 0–40)
Albumin/Globulin Ratio: 1.7 (ref 1.2–2.2)
Albumin: 4.2 g/dL (ref 3.8–4.8)
Alkaline Phosphatase: 89 IU/L (ref 39–117)
BUN/Creatinine Ratio: 15 (ref 12–28)
BUN: 13 mg/dL (ref 8–27)
Bilirubin Total: 0.4 mg/dL (ref 0.0–1.2)
CO2: 21 mmol/L (ref 20–29)
Calcium: 9.8 mg/dL (ref 8.7–10.3)
Chloride: 105 mmol/L (ref 96–106)
Creatinine, Ser: 0.84 mg/dL (ref 0.57–1.00)
GFR calc Af Amer: 81 mL/min/{1.73_m2} (ref >59)
GFR calc non Af Amer: 71 mL/min/{1.73_m2} (ref >59)
Globulin, Total: 2.5 g/dL (ref 1.5–4.5)
Glucose: 85 mg/dL (ref 65–99)
Potassium: 4.1 mmol/L (ref 3.5–5.2)
Sodium: 145 mmol/L — ABNORMAL HIGH (ref 134–144)
Total Protein: 6.7 g/dL (ref 6.0–8.5)

## 2019-12-25 LAB — URIC ACID: Uric Acid: 6.9 mg/dL (ref 3.0–7.2)

## 2019-12-25 LAB — HEMOGLOBIN A1C
Est. average glucose Bld gHb Est-mCnc: 128 mg/dL
Hgb A1c MFr Bld: 6.1 % — ABNORMAL HIGH (ref 4.8–5.6)

## 2019-12-26 NOTE — Patient Instructions (Signed)
 Gout  Gout is painful swelling of your joints. Gout is a type of arthritis. It is caused by having too much uric acid in your body. Uric acid is a chemical that is made when your body breaks down substances called purines. If your body has too much uric acid, sharp crystals can form and build up in your joints. This causes pain and swelling. Gout attacks can happen quickly and be very painful (acute gout). Over time, the attacks can affect more joints and happen more often (chronic gout). What are the causes?  Too much uric acid in your blood. This can happen because: ? Your kidneys do not remove enough uric acid from your blood. ? Your body makes too much uric acid. ? You eat too many foods that are high in purines. These foods include organ meats, some seafood, and beer.  Trauma or stress. What increases the risk?  Having a family history of gout.  Being female and middle-aged.  Being female and having gone through menopause.  Being very overweight (obese).  Drinking alcohol, especially beer.  Not having enough water in the body (being dehydrated).  Losing weight too quickly.  Having an organ transplant.  Having lead poisoning.  Taking certain medicines.  Having kidney disease.  Having a skin condition called psoriasis. What are the signs or symptoms? An attack of acute gout usually happens in just one joint. The most common place is the big toe. Attacks often start at night. Other joints that may be affected include joints of the feet, ankle, knee, fingers, wrist, or elbow. Symptoms of an attack may include:  Very bad pain.  Warmth.  Swelling.  Stiffness.  Shiny, red, or purple skin.  Tenderness. The affected joint may be very painful to touch.  Chills and fever. Chronic gout may cause symptoms more often. More joints may be involved. You may also have white or yellow lumps (tophi) on your hands or feet or in other areas near your joints. How is this  treated?  Treatment for this condition has two phases: treating an acute attack and preventing future attacks.  Acute gout treatment may include: ? NSAIDs. ? Steroids. These are taken by mouth or injected into a joint. ? Colchicine. This medicine relieves pain and swelling. It can be given by mouth or through an IV tube.  Preventive treatment may include: ? Taking small doses of NSAIDs or colchicine daily. ? Using a medicine that reduces uric acid levels in your blood. ? Making changes to your diet. You may need to see a food expert (dietitian) about what to eat and drink to prevent gout. Follow these instructions at home: During a gout attack   If told, put ice on the painful area: ? Put ice in a plastic bag. ? Place a towel between your skin and the bag. ? Leave the ice on for 20 minutes, 2-3 times a day.  Raise (elevate) the painful joint above the level of your heart as often as you can.  Rest the joint as much as possible. If the joint is in your leg, you may be given crutches.  Follow instructions from your doctor about what you cannot eat or drink. Avoiding future gout attacks  Eat a low-purine diet. Avoid foods and drinks such as: ? Liver. ? Kidney. ? Anchovies. ? Asparagus. ? Herring. ? Mushrooms. ? Mussels. ? Beer.  Stay at a healthy weight. If you want to lose weight, talk with your doctor. Do not lose   weight too fast.  Start or continue an exercise plan as told by your doctor. Eating and drinking  Drink enough fluids to keep your pee (urine) pale yellow.  If you drink alcohol: ? Limit how much you use to:  0-1 drink a day for women.  0-2 drinks a day for men. ? Be aware of how much alcohol is in your drink. In the [place] one drink equals one 12 oz bottle of beer (355 mL), one 5 oz glass of wine (148 mL), or one 1 oz glass of hard liquor (44 mL). General instructions  Take over-the-counter and prescription medicines only as told by your doctor.  Do  not drive or use heavy machinery while taking prescription pain medicine.  Return to your normal activities as told by your doctor. Ask your doctor what activities are safe for you.  Keep all follow-up visits as told by your doctor. This is important. Contact a doctor if:  You have another gout attack.  You still have symptoms of a gout attack after 10 days of treatment.  You have problems (side effects) because of your medicines.  You have chills or a fever.  You have burning pain when you pee (urinate).  You have pain in your lower back or belly. Get help right away if:  You have very bad pain.  Your pain cannot be controlled.  You cannot pee. Summary  Gout is painful swelling of the joints.  The most common site of pain is the big toe, but it can affect other joints.  Medicines and avoiding some foods can help to prevent and treat gout attacks. This information is not intended to replace advice given to you by your health care provider. Make sure you discuss any questions you have with your health care provider. Document Revised: 04/30/2018 Document Reviewed: 04/30/2018 Elsevier Patient Education  2020 Elsevier Inc.  

## 2020-01-05 ENCOUNTER — Other Ambulatory Visit: Payer: Self-pay | Admitting: Internal Medicine

## 2020-01-05 DIAGNOSIS — Z1231 Encounter for screening mammogram for malignant neoplasm of breast: Secondary | ICD-10-CM

## 2020-01-14 ENCOUNTER — Other Ambulatory Visit: Payer: Self-pay

## 2020-01-14 ENCOUNTER — Ambulatory Visit
Admission: RE | Admit: 2020-01-14 | Discharge: 2020-01-14 | Disposition: A | Discharge status: Home / Self Care | Payer: Medicare Other | Source: Ambulatory Visit | Attending: Internal Medicine | Admitting: Internal Medicine

## 2020-01-14 DIAGNOSIS — Z1231 Encounter for screening mammogram for malignant neoplasm of breast: Secondary | ICD-10-CM | POA: Diagnosis not present

## 2020-01-26 ENCOUNTER — Other Ambulatory Visit: Payer: Self-pay | Admitting: Internal Medicine

## 2020-01-26 NOTE — Telephone Encounter (Signed)
Zolpidem refill

## 2020-01-27 DIAGNOSIS — L309 Dermatitis, unspecified: Secondary | ICD-10-CM | POA: Diagnosis not present

## 2020-02-01 ENCOUNTER — Ambulatory Visit: Payer: Self-pay | Admitting: *Deleted

## 2020-02-01 NOTE — Telephone Encounter (Signed)
Per initial encounter, "Pt has had both of her vaccines and she was around someone who had both of her vaccine and the someone was exposed to a person who tested positive. [name] would like to know if she should quarantine. [name] is not have any symptoms";  contacted pt and she states this occurred around 01/21/20; pt given recommendations per CDC guidleines, "Refrain from quarantine and testing following a known exposure if asymptomatic"; she verbalized understanding.  Reason for Disposition . General information question, no triage required and triager able to answer question  Answer Assessment - Initial Assessment Questions 1. REASON FOR CALL or QUESTION: "What is your reason for calling today?" or "How can I best help you?" or "What question do you have that I can help answer?"     Quarantine after exposure to COVID; pt already had 2nd vaccine  Protocols used: [facility]

## 2020-02-23 DIAGNOSIS — H40023 Open angle with borderline findings, high risk, bilateral: Secondary | ICD-10-CM | POA: Diagnosis not present

## 2020-02-23 DIAGNOSIS — D492 Neoplasm of unspecified behavior of bone, soft tissue, and skin: Secondary | ICD-10-CM | POA: Diagnosis not present

## 2020-02-23 DIAGNOSIS — Z961 Presence of intraocular lens: Secondary | ICD-10-CM | POA: Diagnosis not present

## 2020-02-23 DIAGNOSIS — H25811 Combined forms of age-related cataract, right eye: Secondary | ICD-10-CM | POA: Diagnosis not present

## 2020-02-25 DIAGNOSIS — D492 Neoplasm of unspecified behavior of bone, soft tissue, and skin: Secondary | ICD-10-CM | POA: Diagnosis not present

## 2020-02-25 DIAGNOSIS — D2210 Melanocytic nevi of unspecified eyelid, including canthus: Secondary | ICD-10-CM | POA: Diagnosis not present

## 2020-03-10 DIAGNOSIS — H25811 Combined forms of age-related cataract, right eye: Secondary | ICD-10-CM | POA: Diagnosis not present

## 2020-04-01 DIAGNOSIS — W57XXXA Bitten or stung by nonvenomous insect and other nonvenomous arthropods, initial encounter: Secondary | ICD-10-CM | POA: Diagnosis not present

## 2020-04-01 DIAGNOSIS — S70261A Insect bite (nonvenomous), right hip, initial encounter: Secondary | ICD-10-CM | POA: Diagnosis not present

## 2020-04-21 ENCOUNTER — Encounter: Payer: Self-pay | Admitting: Internal Medicine

## 2020-04-21 ENCOUNTER — Other Ambulatory Visit: Payer: Self-pay

## 2020-04-21 ENCOUNTER — Ambulatory Visit (INDEPENDENT_AMBULATORY_CARE_PROVIDER_SITE_OTHER): Payer: Medicare Other | Admitting: Internal Medicine

## 2020-04-21 VITALS — BP 122/68 | HR 87 | Temp 98.3°F | Ht 60.0 in | Wt 163.4 lb

## 2020-04-21 DIAGNOSIS — R202 Paresthesia of skin: Secondary | ICD-10-CM

## 2020-04-21 DIAGNOSIS — L989 Disorder of the skin and subcutaneous tissue, unspecified: Secondary | ICD-10-CM | POA: Diagnosis not present

## 2020-04-21 MED ORDER — DOXYCYCLINE HYCLATE 100 MG PO TABS: 100.0000 mg | ORAL_TABLET | Freq: Two times a day (BID) | ORAL | 0 refills | Status: DC

## 2020-04-24 NOTE — Progress Notes (Signed)
This visit occurred during the SARS-CoV-2 public health emergency.  Safety protocols were in place, including screening questions prior to the visit, additional usage of staff PPE, and extensive cleaning of exam room while observing appropriate contact time as indicated for disinfecting solutions.  Subjective:     Patient ID: [name] , female    DOB: 1950-12-29 , 69 y.o.   MRN: [id number]   Chief Complaint  Patient presents with  . fungus right buttocks    HPI  She is here today for further evaluation of "burning" in her right buttocks. She reports her sx started prior to a recent Derm appt. She was initially diagnosed with a bugbite. However, when her sx persisted, then she went back for re-evaluation. A biopsy was performed. She was given rx doxycycline. After taking it for a few days, she was advised to switch to fluconazole. Therefore, she stopped the doxy. She was advised to f/u with PCP by Derm for evaluation of the burning sensation.     Past Medical History:  Diagnosis Date  . Anxiety    takes Xanax prn  . Arthritis   . Cataract   . Diverticulosis   . H/O blood transfusion reaction 2010   hives  . H/O migraine    last 63yrs ago  . Headache(784.0)   . Heart murmur    takes Bystolic nightly  . History of blood clots 74yrs   was on Lovenox injections and Coumadin(was only on that for short period of time)  . History of shingles 3-41yrs ago  . Hx of seasonal allergies    takes Levocetirizine prn  . Hyperlipidemia    takes Crestor nightly  . Hypertension    takes Maxzide daily  . Insomnia    takes Ambien prn  . Joint pain   . Joint swelling   . Moderate tricuspid regurgitation   . Trigeminal neuralgia of right side of face 03/26/2016   V2 distribution     Family History  Problem Relation Age of Onset  . Heart attack Mother   . Alzheimer's disease Mother   . Diabetes Sister   . Hypertension Sister      Current Outpatient Medications:  .  acetaminophen  (TYLENOL) 500 MG tablet, Take 1,000 mg by mouth daily as needed for mild pain., Disp: , Rfl:  .  BYSTOLIC 5 MG tablet, TAKE 1 TABLET DAILY, Disp: 90 tablet, Rfl: 3 .  Carboxymethylcellulose Sodium (EYE DROPS OP), Apply 1 drop to eye 2 (two) times daily as needed (dry eyes)., Disp: , Rfl:  .  cholecalciferol (VITAMIN D) 1000 UNITS tablet, Take 5,000 Units by mouth daily. 5,000, Disp: , Rfl:  .  diphenhydrAMINE (BENADRYL) 25 MG tablet, Take 25 mg by mouth at bedtime as needed for itching., Disp: , Rfl:  .  fluconazole (DIFLUCAN) 200 MG tablet, Take 200 mg by mouth daily., Disp: , Rfl:  .  rosuvastatin (CRESTOR) 10 MG tablet, TAKE 1 TABLET DAILY, Disp: 90 tablet, Rfl: 3 .  triamcinolone ointment (KENALOG) 0.1 %, Apply topically 2 (two) times daily., Disp: , Rfl:  .  triamterene-hydrochlorothiazide (DYAZIDE) 37.5-25 MG capsule, TAKE 1 CAPSULE DAILY, Disp: 90 capsule, Rfl: 3 .  zolpidem (AMBIEN) 5 MG tablet, TAKE 1 TABLET AT BEDTIME AS NEEDED FOR SLEEP, Disp: 60 tablet, Rfl: 1 .  colchicine 0.6 MG tablet, Take 1 tablet (0.6 mg total) by mouth daily for 5 days. (Patient not taking: Reported on 04/21/2020), Disp: 5 tablet, Rfl: 0 .  diphenhydrAMINE-Zinc Acetate (  BENADRYL ITCH RELIEF EX), Apply 1 application topically 3 (three) times daily as needed (itching). (Patient not taking: Reported on 04/21/2020), Disp: , Rfl:  .  docusate sodium (COLACE) 100 MG capsule, Take 1 capsule (100 mg total) by mouth 2 (two) times daily. (Patient not taking: Reported on 04/21/2020), Disp: 10 capsule, Rfl: 0 .  doxycycline (VIBRA-TABS) 100 MG tablet, Take 1 tablet (100 mg total) by mouth 2 (two) times daily., Disp: 14 tablet, Rfl: 0   Allergies  Allergen Reactions  . Ibuprofen Hives and Swelling  . Penicillins Hives and Swelling    Has patient had a PCN reaction causing immediate rash, facial/tongue/throat swelling, SOB or lightheadedness with hypotension: Yes Has patient had a PCN reaction causing severe rash involving mucus  membranes or skin necrosis: No Has patient had a PCN reaction that required hospitalization: No Has patient had a PCN reaction occurring within the last 10 years: No If all of the above answers are "NO", then may proceed with Cephalosporin use.      Review of Systems  Constitutional: Negative.   Respiratory: Negative.   Cardiovascular: Negative.   Gastrointestinal: Negative.   Skin: Positive for rash.  Neurological: Negative.   Psychiatric/Behavioral: Negative.      Today's Vitals   04/21/20 1428  BP: 122/68  Pulse: 87  Temp: 98.3 F (36.8 C)  TempSrc: Oral  Weight: 163 lb 6.4 oz (74.1 kg)  Height: 5' (1.524 m)  PainSc: 6   PainLoc: Buttocks   Body mass index is 31.91 kg/m.   Objective:  Physical Exam Vitals and nursing note reviewed.  Constitutional:      Appearance: Normal appearance.  HENT:     Head: Normocephalic and atraumatic.  Cardiovascular:     Rate and Rhythm: Normal rate and regular rhythm.     Heart sounds: Normal heart sounds.  Pulmonary:     Effort: Pulmonary effort is normal.     Breath sounds: Normal breath sounds.  Skin:    General: Skin is warm.     Comments: Erythematous rash on right buttocks. No drainage. There is some surrounding erythema. No vesicular lesions noted.   Neurological:     General: No focal deficit present.     Mental Status: She is alert.  Psychiatric:        Mood and Affect: Mood normal.        Behavior: Behavior normal.         Assessment And Plan:     1. Paresthesia  Pt advised her sx may be caused by the skin lesion. She will let me know if her sx persist.   2. Skin lesion  I will request her records from [facility] .  I will also give her another round of doxycycline. She is encouraged to take the full course of abx.    [name], MD    THE PATIENT IS ENCOURAGED TO PRACTICE SOCIAL DISTANCING DUE TO THE COVID-19 PANDEMIC.

## 2020-05-12 ENCOUNTER — Other Ambulatory Visit: Payer: Self-pay | Admitting: Internal Medicine

## 2020-05-31 ENCOUNTER — Ambulatory Visit (HOSPITAL_COMMUNITY)
Admission: EM | Admit: 2020-05-31 | Discharge: 2020-05-31 | Disposition: A | Discharge status: Home / Self Care | Payer: Medicare Other | Attending: Family Medicine | Admitting: Family Medicine

## 2020-05-31 ENCOUNTER — Other Ambulatory Visit: Payer: Self-pay

## 2020-05-31 DIAGNOSIS — Z96643 Presence of artificial hip joint, bilateral: Secondary | ICD-10-CM | POA: Diagnosis not present

## 2020-05-31 DIAGNOSIS — Z79899 Other long term (current) drug therapy: Secondary | ICD-10-CM | POA: Insufficient documentation

## 2020-05-31 DIAGNOSIS — E785 Hyperlipidemia, unspecified: Secondary | ICD-10-CM | POA: Diagnosis not present

## 2020-05-31 DIAGNOSIS — Z86718 Personal history of other venous thrombosis and embolism: Secondary | ICD-10-CM | POA: Insufficient documentation

## 2020-05-31 DIAGNOSIS — Z20822 Contact with and (suspected) exposure to covid-19: Secondary | ICD-10-CM | POA: Insufficient documentation

## 2020-05-31 DIAGNOSIS — R011 Cardiac murmur, unspecified: Secondary | ICD-10-CM | POA: Insufficient documentation

## 2020-05-31 DIAGNOSIS — R05 Cough: Secondary | ICD-10-CM | POA: Insufficient documentation

## 2020-05-31 DIAGNOSIS — Z886 Allergy status to analgesic agent status: Secondary | ICD-10-CM | POA: Diagnosis not present

## 2020-05-31 DIAGNOSIS — I10 Essential (primary) hypertension: Secondary | ICD-10-CM | POA: Diagnosis not present

## 2020-05-31 DIAGNOSIS — M1990 Unspecified osteoarthritis, unspecified site: Secondary | ICD-10-CM | POA: Diagnosis not present

## 2020-05-31 DIAGNOSIS — G4700 Insomnia, unspecified: Secondary | ICD-10-CM | POA: Insufficient documentation

## 2020-05-31 DIAGNOSIS — Z880 Allergy status to penicillin: Secondary | ICD-10-CM | POA: Insufficient documentation

## 2020-05-31 DIAGNOSIS — R0981 Nasal congestion: Secondary | ICD-10-CM | POA: Diagnosis not present

## 2020-05-31 DIAGNOSIS — Z9049 Acquired absence of other specified parts of digestive tract: Secondary | ICD-10-CM | POA: Insufficient documentation

## 2020-05-31 DIAGNOSIS — R059 Cough, unspecified: Secondary | ICD-10-CM

## 2020-05-31 LAB — SARS CORONAVIRUS 2 (TAT 6-24 HRS): SARS Coronavirus 2: NEGATIVE

## 2020-05-31 NOTE — ED Provider Notes (Signed)
[facility]   [id number] 05/31/20 Arrival Time: 4332  ASSESSMENT & PLAN:  1. Nasal congestion   2. Cough      COVID-19 testing sent.  OTC symptom care as needed.   Follow-up Information    Glendale Chard, MD.   Specialty: Internal Medicine Why: As needed. Contact information: [address] [place] [address] [phone]               Reviewed expectations re: course of current medical issues. Questions answered. Outlined signs and symptoms indicating need for more acute intervention. Understanding verbalized. After Visit Summary given.   SUBJECTIVE: History from: patient. [name] is a 69 y.o. female who requests COVID-19 testing. Known COVID-19 contact: none; around groups of people recently. Has completed COVID vaccine. Recent travel: none. Reports: cough and body aches for 2-3 days. Denies: fever, difficulty breathing and headache. Normal PO intake without n/v/d.    OBJECTIVE:  Vitals:   05/31/20 1113  BP: 119/61  Pulse: 70  Resp: 18  Temp: 98.5 F (36.9 C)  TempSrc: Oral  SpO2: 100%  Weight: 75.7 kg    General appearance: alert; no distress Eyes: PERRLA; EOMI; conjunctiva normal HENT: [place]; AT; nasal mucosa normal; oral mucosa normal; occasional sneezing Neck: supple  Lungs: speaks full sentences without difficulty; unlabored; occasional cough Extremities: no edema Skin: warm and dry Neurologic: normal gait Psychological: alert and cooperative; normal mood and affect  Labs:  Labs Reviewed  SARS CORONAVIRUS 2 (TAT 6-24 HRS)     Allergies  Allergen Reactions  . Ibuprofen Hives and Swelling  . Penicillins Hives and Swelling    Has patient had a PCN reaction causing immediate rash, facial/tongue/throat swelling, SOB or lightheadedness with hypotension: Yes Has patient had a PCN reaction causing severe rash involving mucus membranes or skin necrosis: No Has patient had a PCN reaction that required  hospitalization: No Has patient had a PCN reaction occurring within the last 10 years: No If all of the above answers are "NO", then may proceed with Cephalosporin use.     Past Medical History:  Diagnosis Date  . Anxiety    takes Xanax prn  . Arthritis   . Cataract   . Diverticulosis   . H/O blood transfusion reaction 2010   hives  . H/O migraine    last 86yrs ago  . Headache(784.0)   . Heart murmur    takes Bystolic nightly  . History of blood clots 6yrs   was on Lovenox injections and Coumadin(was only on that for short period of time)  . History of shingles 3-24yrs ago  . Hx of seasonal allergies    takes Levocetirizine prn  . Hyperlipidemia    takes Crestor nightly  . Hypertension    takes Maxzide daily  . Insomnia    takes Ambien prn  . Joint pain   . Joint swelling   . Moderate tricuspid regurgitation   . Trigeminal neuralgia of right side of face 03/26/2016   V2 distribution   Social History   Socioeconomic History  . Marital status: Married    Spouse name: Not on file  . Number of children: 2  . Years of education: 73  . Highest education level: Not on file  Occupational History  . Occupation: Retired  Tobacco Use  . Smoking status: Passive Smoke Exposure - Never Smoker  . Smokeless tobacco: Never Used  Vaping Use  . Vaping Use: Never used  Substance and Sexual Activity  .  Alcohol use: Not Currently  . Drug use: No  . Sexual activity: Not Currently    Birth control/protection: Surgical  Other Topics Concern  . Not on file  Social History Narrative   Lives at home w/ her husband   Right-handed   About 1 cup of coffee every other day   Social Determinants of Health   Financial Resource Strain: Low Risk   . Difficulty of Paying Living Expenses: Not hard at all  Food Insecurity: No Food Insecurity  . Worried About Charity fundraiser in the Last Year: Never true  . Ran Out of Food in the Last Year: Never true  Transportation Needs: No  Transportation Needs  . Lack of Transportation (Medical): No  . Lack of Transportation (Non-Medical): No  Physical Activity: Inactive  . Days of Exercise per Week: 0 days  . Minutes of Exercise per Session: 0 min  Stress: Stress Concern Present  . Feeling of Stress : To some extent  Social Connections:   . Frequency of Communication with Friends and Family:   . Frequency of Social Gatherings with Friends and Family:   . Attends Religious Services:   . Active Member of Clubs or Organizations:   . Attends Archivist Meetings:   Marland Kitchen Marital Status:   Intimate Partner Violence:   . Fear of Current or Ex-Partner:   . Emotionally Abused:   Marland Kitchen Physically Abused:   . Sexually Abused:    Family History  Problem Relation Age of Onset  . Heart attack Mother   . Alzheimer's disease Mother   . Diabetes Sister   . Hypertension Sister    Past Surgical History:  Procedure Laterality Date  . ABDOMINAL HYSTERECTOMY    . BREAST BIOPSY  05/03/2006   [place]  . BREAST REDUCTION SURGERY  1987  . BUNIONECTOMY Right 05/11/2019  . CATARACT EXTRACTION Left 07/2019  . CHOLECYSTECTOMY    . COLONOSCOPY    . ESOPHAGOGASTRODUODENOSCOPY    . EYE SURGERY Right    eye lash removed  . JOINT REPLACEMENT     bilateral hip rt in 2007 and lt in 2010  . NM MYOCAR PERF WALL MOTION  11/04/2008   Normal  . REDUCTION MAMMAPLASTY Bilateral 1988  . REDUCTION MAMMAPLASTY    . SHOULDER ARTHROSCOPY WITH DISTAL CLAVICLE RESECTION Right 09/21/2015   Procedure: RIGHT SHOULDER ARTHROSCOPY WITH DISTAL CLAVICLE EXCISION DEBRIDE LABRAL TEAR ACROMIOPLASTY ;  Surgeon: Frederik Pear, MD;  Location: [facility];  Service: Orthopedics;  Laterality: Right;  . TEE WITHOUT CARDIOVERSION N/A 04/12/2015   Procedure: TRANSESOPHAGEAL ECHOCARDIOGRAM (TEE)/BUBBLE STUDY;  Surgeon: Adrian Prows, MD;  Location: [facility];  Service: Cardiovascular;  Laterality: N/A;  . TOTAL HIP REVISION  04/14/2012   Procedure: TOTAL HIP  REVISION;  Surgeon: Kerin Salen, MD;  Location: [facility];  Service: Orthopedics;  Laterality: Right;  right total hip revision  . TOTAL HIP REVISION Left 11/30/2013   Procedure: TOTAL HIP REVISION;  Surgeon: Kerin Salen, MD;  Location: [facility];  Service: Orthopedics;  Laterality: Left;     Vanessa Kick, MD 05/31/20 1134

## 2020-05-31 NOTE — ED Triage Notes (Signed)
Pt is here with nasal congestion, body aches and cough that started Sunday, pt states she went to the beach(Thurs-Sun). Pt has taken Tylenol and Alka Seltzer to relieve discomfort.

## 2020-05-31 NOTE — Discharge Instructions (Addendum)
You have been tested for COVID-19 today. °If your test returns positive, you will receive a phone call from [facility] regarding your results. °Negative test results are not called. °Both positive and negative results area always visible on MyChart. °If you do not have a MyChart account, sign up instructions are provided in your discharge papers. °Please do not hesitate to contact us should you have questions or concerns. ° °

## 2020-06-14 ENCOUNTER — Other Ambulatory Visit: Payer: Self-pay | Admitting: Internal Medicine

## 2020-06-15 NOTE — Telephone Encounter (Signed)
Ambien refill  Last 04/21/20 Next 06/29/20

## 2020-06-29 ENCOUNTER — Other Ambulatory Visit: Payer: Self-pay

## 2020-06-29 ENCOUNTER — Ambulatory Visit (INDEPENDENT_AMBULATORY_CARE_PROVIDER_SITE_OTHER): Payer: Medicare Other | Admitting: Internal Medicine

## 2020-06-29 ENCOUNTER — Encounter: Payer: Self-pay | Admitting: Internal Medicine

## 2020-06-29 VITALS — BP 112/70 | HR 56 | Temp 97.5°F | Ht 60.0 in | Wt 166.0 lb

## 2020-06-29 DIAGNOSIS — Z79899 Other long term (current) drug therapy: Secondary | ICD-10-CM

## 2020-06-29 DIAGNOSIS — E7800 Pure hypercholesterolemia, unspecified: Secondary | ICD-10-CM

## 2020-06-29 DIAGNOSIS — R7303 Prediabetes: Secondary | ICD-10-CM | POA: Diagnosis not present

## 2020-06-29 DIAGNOSIS — Z23 Encounter for immunization: Secondary | ICD-10-CM | POA: Diagnosis not present

## 2020-06-29 DIAGNOSIS — E559 Vitamin D deficiency, unspecified: Secondary | ICD-10-CM | POA: Diagnosis not present

## 2020-06-29 DIAGNOSIS — E6609 Other obesity due to excess calories: Secondary | ICD-10-CM | POA: Diagnosis not present

## 2020-06-29 DIAGNOSIS — I10 Essential (primary) hypertension: Secondary | ICD-10-CM | POA: Diagnosis not present

## 2020-06-29 DIAGNOSIS — Z6832 Body mass index (BMI) 32.0-32.9, adult: Secondary | ICD-10-CM | POA: Diagnosis not present

## 2020-06-29 NOTE — Progress Notes (Signed)
I,Katawbba Wiggins,acting as a Education administrator for [name], MD.,have documented all relevant documentation on the behalf of [name], MD,as directed by  [name], MD while in the presence of [name], MD.  This visit occurred during the SARS-CoV-2 public health emergency.  Safety protocols were in place, including screening questions prior to the visit, additional usage of staff PPE, and extensive cleaning of exam room while observing appropriate contact time as indicated for disinfecting solutions.  Subjective:     Patient ID: [name] , female    DOB: [birth year] , 69 y.o.   MRN: [id number]   Chief Complaint  Patient presents with  . prediabetes  . Hypertension  . Hyperlipidemia    HPI  The patient is here to day for a pre-diabetes, blood pressure, and cholesterol follow-up.  She reports compliance with meds. She has no specific concerns at this time.   Hypertension This is a chronic problem. The current episode started more than 1 year ago. The problem has been gradually improving since onset. The problem is controlled. Pertinent negatives include no blurred vision or chest pain. Risk factors for coronary artery disease include dyslipidemia, post-menopausal state, sedentary lifestyle and stress. Past treatments include beta blockers and diuretics. The current treatment provides moderate improvement.  Hyperlipidemia This is a chronic problem. The current episode started more than 1 year ago. The problem is controlled. Exacerbating diseases include obesity. Pertinent negatives include no chest pain, leg pain or myalgias. Current antihyperlipidemic treatment includes statins. The current treatment provides moderate improvement of lipids. Risk factors for coronary artery disease include dyslipidemia, post-menopausal and a sedentary lifestyle.     Past Medical History:  Diagnosis Date  . Anxiety    takes Xanax prn  . Arthritis   . Cataract   . Diverticulosis    . H/O blood transfusion reaction 2010   hives  . H/O migraine    last 64yr ago  . Headache(784.0)   . Heart murmur    takes Bystolic nightly  . History of blood clots 436yr  was on Lovenox injections and Coumadin(was only on that for short period of time)  . History of shingles 3-4y4yrgo  . Hx of seasonal allergies    takes Levocetirizine prn  . Hyperlipidemia    takes Crestor nightly  . Hypertension    takes Maxzide daily  . Insomnia    takes Ambien prn  . Joint pain   . Joint swelling   . Moderate tricuspid regurgitation   . Trigeminal neuralgia of right side of face 03/26/2016   V2 distribution     Family History  Problem Relation Age of Onset  . Heart attack Mother   . Alzheimer's disease Mother   . Diabetes Sister   . Hypertension Sister      Current Outpatient Medications:  .  acetaminophen (TYLENOL) 500 MG tablet, Take 1,000 mg by mouth daily as needed for mild pain., Disp: , Rfl:  .  BYSTOLIC 5 MG tablet, TAKE 1 TABLET DAILY, Disp: 90 tablet, Rfl: 3 .  cholecalciferol (VITAMIN D) 1000 UNITS tablet, Take 5,000 Units by mouth daily. 5,000, Disp: , Rfl:  .  diphenhydrAMINE (BENADRYL) 25 MG tablet, Take 25 mg by mouth at bedtime as needed for itching., Disp: , Rfl:  .  docusate sodium (COLACE) 100 MG capsule, Take 1 capsule (100 mg total) by mouth 2 (two) times daily., Disp: 10 capsule, Rfl: 0 .  rosuvastatin (CRESTOR) 10 MG tablet, TAKE  1 TABLET DAILY, Disp: 90 tablet, Rfl: 3 .  triamterene-hydrochlorothiazide (DYAZIDE) 37.5-25 MG capsule, TAKE 1 CAPSULE DAILY, Disp: 90 capsule, Rfl: 3 .  zolpidem (AMBIEN) 5 MG tablet, TAKE 1 TABLET AT BEDTIME AS NEEDED FOR SLEEP, Disp: 60 tablet, Rfl: 1 .  colchicine 0.6 MG tablet, Take 1 tablet (0.6 mg total) by mouth daily for 5 days. (Patient not taking: Reported on 04/21/2020), Disp: 5 tablet, Rfl: 0 .  triamcinolone ointment (KENALOG) 0.1 %, Apply topically 2 (two) times daily. (Patient not taking: Reported on 06/29/2020), Disp: ,  Rfl:    Allergies  Allergen Reactions  . Ibuprofen Hives and Swelling  . Penicillins Hives and Swelling    Has patient had a PCN reaction causing immediate rash, facial/tongue/throat swelling, SOB or lightheadedness with hypotension: Yes Has patient had a PCN reaction causing severe rash involving mucus membranes or skin necrosis: No Has patient had a PCN reaction that required hospitalization: No Has patient had a PCN reaction occurring within the last 10 years: No If all of the above answers are "NO", then may proceed with Cephalosporin use.      Review of Systems  Constitutional: Negative.   Eyes: Negative for blurred vision.  Respiratory: Negative.   Cardiovascular: Negative.  Negative for chest pain.  Gastrointestinal: Negative.   Musculoskeletal: Negative for myalgias.  Psychiatric/Behavioral: Negative.   All other systems reviewed and are negative.    Today's Vitals   06/29/20 1133  BP: 112/70  Pulse: (!) 56  Temp: (!) 97.5 F (36.4 C)  TempSrc: Oral  Weight: 166 lb (75.3 kg)  Height: 5' (1.524 m)  PainSc: 6   PainLoc: Knee   Body mass index is 32.42 kg/m.  Wt Readings from Last 3 Encounters:  06/29/20 166 lb (75.3 kg)  05/31/20 166 lb 12.8 oz (75.7 kg)  04/21/20 163 lb 6.4 oz (74.1 kg)   Objective:  Physical Exam Vitals and nursing note reviewed.  Constitutional:      Appearance: Normal appearance. She is obese.  HENT:     Head: Normocephalic and atraumatic.  Cardiovascular:     Rate and Rhythm: Normal rate and regular rhythm.     Heart sounds: Normal heart sounds.  Pulmonary:     Breath sounds: Normal breath sounds.  Skin:    General: Skin is warm.  Neurological:     General: No focal deficit present.     Mental Status: She is alert and oriented to person, place, and time.         Assessment And Plan:     1. Prediabetes Comments: I will check an a1c, bmp today.  Encouraged to limit her sugary drinks.  - Hemoglobin A1c - BMP8+EGFR  2.  Essential hypertension Comments: Chronic, well controlled.  She will continue with current meds.  Encouraged to avoid adding salt to her foods.  She will RTO in six months for re-evaluation.  - BMP8+EGFR - CBC no Diff  3. Pure hypercholesterolemia Comments: Chronic, March 2021 results reviewed in full detail. Encouraged to limit fried food intake and incorporate more exercise into her daily routine.   4. Vitamin D deficiency disease Comments: I will check vitamin D level and supplement as needed.  - Vitamin D (25 hydroxy)  5. Class 1 obesity due to excess calories with serious comorbidity and body mass index (BMI) of 32.0 to 32.9 in adult Comments: She is encouraged to aim for BMI less than 30 to decrease cardiac risk.   Advised to aim for 150  minutes of exercise per week.  6. Drug therapy Comments: I will check vitamin B12 level today.  - Vitamin B12  7. Need for vaccination Comments: She was given high dose flu vaccine to update her immunization history.  - Flu Vaccine QUAD High Dose(Fluad)    Patient was given opportunity to ask questions. Patient verbalized understanding of the plan and was able to repeat key elements of the plan. All questions were answered to their satisfaction.  [name], MD   I, [name], MD, have reviewed all documentation for this visit. The documentation on 07/03/20 for the exam, diagnosis, procedures, and orders are all accurate and complete.  THE PATIENT IS ENCOURAGED TO PRACTICE SOCIAL DISTANCING DUE TO THE COVID-19 PANDEMIC.

## 2020-06-30 DIAGNOSIS — I10 Essential (primary) hypertension: Secondary | ICD-10-CM | POA: Diagnosis not present

## 2020-06-30 DIAGNOSIS — R7303 Prediabetes: Secondary | ICD-10-CM | POA: Diagnosis not present

## 2020-06-30 LAB — CBC
Hematocrit: 44.3 % (ref 34.0–46.6)
Hemoglobin: 14.1 g/dL (ref 11.1–15.9)
MCH: 27.5 pg (ref 26.6–33.0)
MCHC: 31.8 g/dL (ref 31.5–35.7)
MCV: 86 fL (ref 79–97)
Platelets: 143 10*3/uL — ABNORMAL LOW (ref 150–450)
RBC: 5.13 x10E6/uL (ref 3.77–5.28)
RDW: 15 % (ref 11.7–15.4)
WBC: 5 10*3/uL (ref 3.4–10.8)

## 2020-06-30 LAB — VITAMIN B12: Vitamin B-12: 681 pg/mL (ref 232–1245)

## 2020-06-30 LAB — VITAMIN D 25 HYDROXY (VIT D DEFICIENCY, FRACTURES): Vit D, 25-Hydroxy: 87.7 ng/mL (ref 30.0–100.0)

## 2020-07-01 LAB — BMP8+EGFR
BUN/Creatinine Ratio: 20 (ref 12–28)
BUN: 16 mg/dL (ref 8–27)
CO2: 25 mmol/L (ref 20–29)
Calcium: 9.4 mg/dL (ref 8.7–10.3)
Chloride: 103 mmol/L (ref 96–106)
Creatinine, Ser: 0.8 mg/dL (ref 0.57–1.00)
GFR calc Af Amer: 86 mL/min/{1.73_m2} (ref >59)
GFR calc non Af Amer: 75 mL/min/{1.73_m2} (ref >59)
Glucose: 94 mg/dL (ref 65–99)
Potassium: 4 mmol/L (ref 3.5–5.2)
Sodium: 141 mmol/L (ref 134–144)

## 2020-07-01 LAB — HEMOGLOBIN A1C
Est. average glucose Bld gHb Est-mCnc: 134 mg/dL
Hgb A1c MFr Bld: 6.3 % — ABNORMAL HIGH (ref 4.8–5.6)

## 2020-07-22 DIAGNOSIS — M1711 Unilateral primary osteoarthritis, right knee: Secondary | ICD-10-CM | POA: Diagnosis not present

## 2020-07-22 DIAGNOSIS — M25661 Stiffness of right knee, not elsewhere classified: Secondary | ICD-10-CM | POA: Diagnosis not present

## 2020-07-26 DIAGNOSIS — Z23 Encounter for immunization: Secondary | ICD-10-CM | POA: Diagnosis not present

## 2020-07-28 DIAGNOSIS — H40023 Open angle with borderline findings, high risk, bilateral: Secondary | ICD-10-CM | POA: Diagnosis not present

## 2020-07-28 DIAGNOSIS — B0239 Other herpes zoster eye disease: Secondary | ICD-10-CM | POA: Diagnosis not present

## 2020-07-28 DIAGNOSIS — Z961 Presence of intraocular lens: Secondary | ICD-10-CM | POA: Diagnosis not present

## 2020-07-28 DIAGNOSIS — H25811 Combined forms of age-related cataract, right eye: Secondary | ICD-10-CM | POA: Diagnosis not present

## 2020-08-09 ENCOUNTER — Other Ambulatory Visit: Payer: Self-pay | Admitting: Internal Medicine

## 2020-08-09 DIAGNOSIS — N6320 Unspecified lump in the left breast, unspecified quadrant: Secondary | ICD-10-CM

## 2020-08-09 DIAGNOSIS — N644 Mastodynia: Secondary | ICD-10-CM

## 2020-08-10 ENCOUNTER — Telehealth: Payer: Self-pay

## 2020-08-10 NOTE — Telephone Encounter (Signed)
The pt said that she has as sore under her left breast along with a knot and that the breast center said that she needed an order for the ultrasound and the diagnostic mammogram.

## 2020-08-11 ENCOUNTER — Ambulatory Visit (INDEPENDENT_AMBULATORY_CARE_PROVIDER_SITE_OTHER): Payer: Medicare Other | Admitting: Internal Medicine

## 2020-08-11 ENCOUNTER — Encounter: Payer: Self-pay | Admitting: Internal Medicine

## 2020-08-11 ENCOUNTER — Other Ambulatory Visit: Payer: Self-pay

## 2020-08-11 VITALS — BP 112/64 | HR 62 | Temp 97.9°F | Ht 60.0 in | Wt 163.8 lb

## 2020-08-11 DIAGNOSIS — L910 Hypertrophic scar: Secondary | ICD-10-CM | POA: Diagnosis not present

## 2020-08-11 DIAGNOSIS — N644 Mastodynia: Secondary | ICD-10-CM

## 2020-08-11 NOTE — Progress Notes (Signed)
I,Katawbba Wiggins,acting as a Education administrator for [name], MD.,have documented all relevant documentation on the behalf of [name], MD,as directed by  [name], MD while in the presence of [name], MD.rever  This visit occurred during the SARS-CoV-2 public health emergency.  Safety protocols were in place, including screening questions prior to the visit, additional usage of staff PPE, and extensive cleaning of exam room while observing appropriate contact time as indicated for disinfecting solutions.  Subjective:     Patient ID: [name] , female    DOB: [birth year] , 69 y.o.   MRN: [id number]   Chief Complaint  Patient presents with  . Referral    diagnostic mammogram    HPI  The patient is here today for evaluation for a diagnostic mammogram. She tried to schedule yearly mammogram, and told scheduler that she was having left breast pain. Pt states diagnostic mammo must be ordered by me. She denies any new, abnormal lumps/bumps and nipple discharge. She has been having left breast pain. Unable to state what is triggering her sx. Her pain is exacerbated by movement.     Past Medical History:  Diagnosis Date  . Anxiety    takes Xanax prn  . Arthritis   . Cataract   . Diverticulosis   . H/O blood transfusion reaction 2010   hives  . H/O migraine    last 86yrs ago  . Headache(784.0)   . Heart murmur    takes Bystolic nightly  . History of blood clots 40yrs   was on Lovenox injections and Coumadin(was only on that for short period of time)  . History of shingles 3-50yrs ago  . Hx of seasonal allergies    takes Levocetirizine prn  . Hyperlipidemia    takes Crestor nightly  . Hypertension    takes Maxzide daily  . Insomnia    takes Ambien prn  . Joint pain   . Joint swelling   . Moderate tricuspid regurgitation   . Trigeminal neuralgia of right side of face 03/26/2016   V2 distribution     Family History  Problem Relation Age of Onset  . Heart  attack Mother   . Alzheimer's disease Mother   . Diabetes Sister   . Hypertension Sister      Current Outpatient Medications:  .  acetaminophen (TYLENOL) 500 MG tablet, Take 1,000 mg by mouth daily as needed for mild pain. , Disp: , Rfl:  .  BYSTOLIC 5 MG tablet, TAKE 1 TABLET DAILY, Disp: 90 tablet, Rfl: 3 .  cholecalciferol (VITAMIN D) 1000 UNITS tablet, Take 5,000 Units by mouth daily. 5,000, Disp: , Rfl:  .  diphenhydrAMINE (BENADRYL) 25 MG tablet, Take 25 mg by mouth at bedtime as needed for itching. , Disp: , Rfl:  .  docusate sodium (COLACE) 100 MG capsule, Take 1 capsule (100 mg total) by mouth 2 (two) times daily., Disp: 10 capsule, Rfl: 0 .  rosuvastatin (CRESTOR) 10 MG tablet, TAKE 1 TABLET DAILY, Disp: 90 tablet, Rfl: 3 .  triamterene-hydrochlorothiazide (DYAZIDE) 37.5-25 MG capsule, TAKE 1 CAPSULE DAILY, Disp: 90 capsule, Rfl: 3 .  zolpidem (AMBIEN) 5 MG tablet, TAKE 1 TABLET AT BEDTIME AS NEEDED FOR SLEEP, Disp: 60 tablet, Rfl: 1 .  colchicine 0.6 MG tablet, Take 1 tablet (0.6 mg total) by mouth daily for 5 days. (Patient not taking: Reported on 04/21/2020), Disp: 5 tablet, Rfl: 0 .  triamcinolone ointment (KENALOG) 0.1 %, Apply topically 2 (two) times  daily. (Patient not taking: Reported on 06/29/2020), Disp: , Rfl:    Allergies  Allergen Reactions  . Ibuprofen Hives and Swelling  . Penicillins Hives and Swelling    Has patient had a PCN reaction causing immediate rash, facial/tongue/throat swelling, SOB or lightheadedness with hypotension: Yes Has patient had a PCN reaction causing severe rash involving mucus membranes or skin necrosis: No Has patient had a PCN reaction that required hospitalization: No Has patient had a PCN reaction occurring within the last 10 years: No If all of the above answers are "NO", then may proceed with Cephalosporin use.      Review of Systems  Constitutional: Negative.   Respiratory: Negative.   Cardiovascular: Negative.   Gastrointestinal:  Negative.   Psychiatric/Behavioral: Negative.   All other systems reviewed and are negative.    Today's Vitals   08/11/20 1411  BP: 112/64  Pulse: 62  Temp: 97.9 F (36.6 C)  Weight: 163 lb 12.8 oz (74.3 kg)  Height: 5' (1.524 m)  PainSc: 0-No pain   Body mass index is 31.99 kg/m.  Wt Readings from Last 3 Encounters:  08/11/20 163 lb 12.8 oz (74.3 kg)  06/29/20 166 lb (75.3 kg)  05/31/20 166 lb 12.8 oz (75.7 kg)   Objective:  Physical Exam Vitals and nursing note reviewed.  Constitutional:      Appearance: Normal appearance.  HENT:     Head: Normocephalic and atraumatic.  Cardiovascular:     Rate and Rhythm: Normal rate and regular rhythm.     Heart sounds: Normal heart sounds.  Pulmonary:     Breath sounds: Normal breath sounds.  Chest:       Comments: Tenderness to palpation Skin:    General: Skin is warm.     Comments: keloid  Neurological:     General: No focal deficit present.     Mental Status: She is alert and oriented to person, place, and time.         Assessment And Plan:     1. Mastodynia of left breast Comments: I will refer her for diagnostic mammo and u/s if needed. Pt adivsed her sx could be due to musculoskeletal issues as well.  - [place] Unlisted Procedure Breast; Future - MM DIAG BREAST TOMO UNI LEFT; Future  2. Keloid of skin Comments: It appears the skin is irritated, no infection noted.     Patient was given opportunity to ask questions. Patient verbalized understanding of the plan and was able to repeat key elements of the plan. All questions were answered to their satisfaction.  [name], MD   I, [name], MD, have reviewed all documentation for this visit. The documentation on 08/17/20 for the exam, diagnosis, procedures, and orders are all accurate and complete.  THE PATIENT IS ENCOURAGED TO PRACTICE SOCIAL DISTANCING DUE TO THE COVID-19 PANDEMIC.

## 2020-08-11 NOTE — Patient Instructions (Signed)
Breast Self-Awareness Breast self-awareness means being familiar with how your breasts look and feel. It involves checking your breasts regularly and reporting any changes to your health care provider. Practicing breast self-awareness is important. Sometimes changes may not be harmful (are benign), but sometimes a change in your breasts can be a sign of a serious medical problem. It is important to learn how to do this procedure correctly so that you can catch problems early, when treatment is more likely to be successful. All women should practice breast self-awareness, including women who have had breast implants. What you need:  A mirror.  A well-lit room. How to do a breast self-exam A breast self-exam is one way to learn what is normal for your breasts and whether your breasts are changing. To do a breast self-exam: Look for changes  1. Remove all the clothing above your waist. 2. Stand in front of a mirror in a room with good lighting. 3. Put your hands on your hips. 4. Push your hands firmly downward. 5. Compare your breasts in the mirror. Look for differences between them (asymmetry), such as: ? Differences in shape. ? Differences in size. ? Puckers, dips, and bumps in one breast and not the other. 6. Look at each breast for changes in the skin, such as: ? Redness. ? Scaly areas. 7. Look for changes in your nipples, such as: ? Discharge. ? Bleeding. ? Dimpling. ? Redness. ? A change in position. Feel for changes Carefully feel your breasts for lumps and changes. It is best to do this while lying on your back on the floor, and again while sitting or standing in the tub or shower with soapy water on your skin. Feel each breast in the following way: 1. Place the arm on the side of the breast you are examining above your head. 2. Feel your breast with the other hand. 3. Start in the nipple area and make -inch (2 cm) overlapping circles to feel your breast. Use the pads of your  three middle fingers to do this. Apply light pressure, then medium pressure, then firm pressure. The light pressure will allow you to feel the tissue closest to the skin. The medium pressure will allow you to feel the tissue that is a little deeper. The firm pressure will allow you to feel the tissue close to the ribs. 4. Continue the overlapping circles, moving downward over the breast until you feel your ribs below your breast. 5. Move one finger-width toward the center of the body. Continue to use the -inch (2 cm) overlapping circles to feel your breast as you move slowly up toward your collarbone. 6. Continue the up-and-down exam using all three pressures until you reach your armpit.  Write down what you find Writing down what you find can help you remember what to discuss with your health care provider. Write down:  What is normal for each breast.  Any changes that you find in each breast, including: ? The kind of changes you find. ? Any pain or tenderness. ? Size and location of any lumps.  Where you are in your menstrual cycle, if you are still menstruating. General tips and recommendations  Examine your breasts every month.  If you are breastfeeding, the best time to examine your breasts is after a feeding or after using a breast pump.  If you menstruate, the best time to examine your breasts is 5-7 days after your period. Breasts are generally lumpier during menstrual periods, and it may  be more difficult to notice changes.  With time and practice, you will become more familiar with the variations in your breasts and more comfortable with the exam. Contact a health care provider if you:  See a change in the shape or size of your breasts or nipples.  See a change in the skin of your breast or nipples, such as a reddened or scaly area.  Have unusual discharge from your nipples.  Find a lump or thick area that was not there before.  Have pain in your breasts.  Have any  concerns related to your breast health. Summary  Breast self-awareness includes looking for physical changes in your breasts, as well as feeling for any changes within your breasts.  Breast self-awareness should be performed in front of a mirror in a well-lit room.  You should examine your breasts every month. If you menstruate, the best time to examine your breasts is 5-7 days after your menstrual period.  Let your health care provider know of any changes you notice in your breasts, including changes in size, changes on the skin, pain or tenderness, or unusual fluid from your nipples. This information is not intended to replace advice given to you by your health care provider. Make sure you discuss any questions you have with your health care provider. Document Revised: 05/27/2018 Document Reviewed: 05/27/2018 Elsevier Patient Education  [place].

## 2020-08-15 ENCOUNTER — Other Ambulatory Visit: Payer: Self-pay | Admitting: Internal Medicine

## 2020-08-15 DIAGNOSIS — N644 Mastodynia: Secondary | ICD-10-CM

## 2020-08-16 DIAGNOSIS — N644 Mastodynia: Secondary | ICD-10-CM | POA: Diagnosis not present

## 2020-08-19 DIAGNOSIS — M25561 Pain in right knee: Secondary | ICD-10-CM | POA: Diagnosis not present

## 2020-08-22 DIAGNOSIS — M25561 Pain in right knee: Secondary | ICD-10-CM | POA: Diagnosis not present

## 2020-08-31 DIAGNOSIS — H2511 Age-related nuclear cataract, right eye: Secondary | ICD-10-CM | POA: Diagnosis not present

## 2020-09-02 DIAGNOSIS — H25811 Combined forms of age-related cataract, right eye: Secondary | ICD-10-CM | POA: Diagnosis not present

## 2020-09-07 ENCOUNTER — Other Ambulatory Visit: Payer: Self-pay

## 2020-09-07 ENCOUNTER — Ambulatory Visit
Admission: RE | Admit: 2020-09-07 | Discharge: 2020-09-07 | Disposition: A | Discharge status: Home / Self Care | Payer: Medicare Other | Source: Ambulatory Visit | Attending: Internal Medicine | Admitting: Internal Medicine

## 2020-09-07 ENCOUNTER — Ambulatory Visit: Payer: Medicare Other

## 2020-09-07 DIAGNOSIS — N644 Mastodynia: Secondary | ICD-10-CM

## 2020-09-22 ENCOUNTER — Other Ambulatory Visit: Payer: Self-pay | Admitting: Internal Medicine

## 2020-09-29 ENCOUNTER — Other Ambulatory Visit: Payer: Self-pay

## 2020-09-29 ENCOUNTER — Ambulatory Visit: Payer: Medicare Other | Admitting: Cardiology

## 2020-09-29 ENCOUNTER — Encounter: Payer: Self-pay | Admitting: Cardiology

## 2020-09-29 VITALS — BP 110/66 | HR 61 | Ht 60.0 in | Wt 166.0 lb

## 2020-09-29 DIAGNOSIS — E7800 Pure hypercholesterolemia, unspecified: Secondary | ICD-10-CM

## 2020-09-29 DIAGNOSIS — I38 Endocarditis, valve unspecified: Secondary | ICD-10-CM

## 2020-09-29 DIAGNOSIS — I071 Rheumatic tricuspid insufficiency: Secondary | ICD-10-CM

## 2020-09-29 DIAGNOSIS — I10 Essential (primary) hypertension: Secondary | ICD-10-CM

## 2020-09-29 NOTE — Progress Notes (Signed)
Primary Physician/Referring:  Glendale Chard, MD  Patient ID: [name], female    DOB: [birth year], 69 y.o.   MRN: [id number]  Chief Complaint  Patient presents with  . Valvular heart disease  . Follow-up    1 year   HPI:    [name]  is a 69 y.o. African-American female with  abnormal EKG suggesting cor pulmonale, mild pulmonary hypertension, essential hypertension, hyperlipidemia, prediabetes mellitus.   Patient presents for annual follow up. She is presently doing well without specific complaints today. Denies chest pain, dyspnea, palpitations, leg swelling, PND, orthopnea. She continues to be active around her house, but is without a regular exercise routine. She is not smoking, quit in January 2020. She follows with her PCP for management of hypertension, hyperlipidemia, and prediabetes.   Past Medical History:  Diagnosis Date  . Anxiety    takes Xanax prn  . Arthritis   . Cataract   . Diverticulosis   . H/O blood transfusion reaction 2010   hives  . H/O migraine    last 56yrs ago  . Headache(784.0)   . Heart murmur    takes Bystolic nightly  . History of blood clots 66yrs   was on Lovenox injections and Coumadin(was only on that for short period of time)  . History of shingles 3-10yrs ago  . Hx of seasonal allergies    takes Levocetirizine prn  . Hyperlipidemia    takes Crestor nightly  . Hypertension    takes Maxzide daily  . Insomnia    takes Ambien prn  . Joint pain   . Joint swelling   . Moderate tricuspid regurgitation   . Trigeminal neuralgia of right side of face 03/26/2016   V2 distribution   Past Surgical History:  Procedure Laterality Date  . ABDOMINAL HYSTERECTOMY    . BREAST BIOPSY  05/03/2006   [place]  . BREAST REDUCTION SURGERY  1987  . BUNIONECTOMY Right 05/11/2019  . CATARACT EXTRACTION Left 07/2019  . CHOLECYSTECTOMY    . COLONOSCOPY    . ESOPHAGOGASTRODUODENOSCOPY    . EYE SURGERY Right    eye lash removed  . JOINT REPLACEMENT      bilateral hip rt in 2007 and lt in 2010  . NM MYOCAR PERF WALL MOTION  11/04/2008   Normal  . REDUCTION MAMMAPLASTY Bilateral 1988  . REDUCTION MAMMAPLASTY    . SHOULDER ARTHROSCOPY WITH DISTAL CLAVICLE RESECTION Right 09/21/2015   Procedure: RIGHT SHOULDER ARTHROSCOPY WITH DISTAL CLAVICLE EXCISION DEBRIDE LABRAL TEAR ACROMIOPLASTY ;  Surgeon: Frederik Pear, MD;  Location: [facility];  Service: Orthopedics;  Laterality: Right;  . TEE WITHOUT CARDIOVERSION N/A 04/12/2015   Procedure: TRANSESOPHAGEAL ECHOCARDIOGRAM (TEE)/BUBBLE STUDY;  Surgeon: Adrian Prows, MD;  Location: [facility];  Service: Cardiovascular;  Laterality: N/A;  . TOTAL HIP REVISION  04/14/2012   Procedure: TOTAL HIP REVISION;  Surgeon: Kerin Salen, MD;  Location: [facility];  Service: Orthopedics;  Laterality: Right;  right total hip revision  . TOTAL HIP REVISION Left 11/30/2013   Procedure: TOTAL HIP REVISION;  Surgeon: Kerin Salen, MD;  Location: [facility];  Service: Orthopedics;  Laterality: Left;   Social History   Tobacco Use  . Smoking status: Passive Smoke Exposure - Never Smoker  . Smokeless tobacco: Never Used  Substance Use Topics  . Alcohol use: Not Currently   Marital status: Married  ROS  Review of Systems  Constitutional: Positive for malaise/fatigue.  HENT: Negative.   Respiratory: Negative  for shortness of breath.   Cardiovascular: Negative for chest pain, palpitations, orthopnea, claudication, leg swelling and PND.  Gastrointestinal: Negative.   Genitourinary: Negative.   Musculoskeletal: Positive for joint pain.  Skin: Negative.   Neurological: Negative for dizziness.  Endo/Heme/Allergies: Negative.   Psychiatric/Behavioral: Negative.     Objective  Blood pressure 110/66, pulse 61, height 5' (1.524 m), weight 166 lb (75.3 kg), SpO2 99 %.  Vitals with BMI 09/29/2020 08/11/2020 06/29/2020  Height 5\' 0"  5\' 0"  5\' 0"   Weight 166 lbs 163 lbs 13 oz 166 lbs  BMI 32.42 51.88 41.66  Systolic  063 016 010  Diastolic 66 64 70  Pulse 61 62 56    Physical Exam Constitutional:      General: She is not in acute distress.    Appearance: She is normal weight.  HENT:     Head: Normocephalic and atraumatic.  Eyes:     Extraocular Movements: Extraocular movements intact.     Conjunctiva/sclera: Conjunctivae normal.  Cardiovascular:     Rate and Rhythm: Normal rate and regular rhythm.     Pulses:          Carotid pulses are 2+ on the right side and 2+ on the left side.      Radial pulses are 2+ on the right side and 2+ on the left side.       Popliteal pulses are 2+ on the right side and 2+ on the left side.       Dorsalis pedis pulses are 2+ on the right side and 2+ on the left side.       Posterior tibial pulses are 2+ on the right side and 2+ on the left side.     Heart sounds: S1 normal and S2 normal. No murmur heard.     Comments: No JVD.  Pulmonary:     Effort: Pulmonary effort is normal.     Breath sounds: Normal breath sounds. No wheezing, rhonchi or rales.  Abdominal:     General: Bowel sounds are normal. There is no distension.     Palpations: Abdomen is soft.  Musculoskeletal:     Right lower leg: No edema.     Left lower leg: No edema.  Skin:    General: Skin is warm and dry.    Laboratory examination:   Recent Labs    12/24/19 1218 06/30/20 1508  NA 145* 141  K 4.1 4.0  CL 105 103  CO2 21 25  GLUCOSE 85 94  BUN 13 16  CREATININE 0.84 0.80  CALCIUM 9.8 9.4  GFRNONAA 71 75  GFRAA 81 86   CrCl cannot be calculated (Patient's most recent lab result is older than the maximum 21 days allowed.).  CMP Latest Ref Rng & Units 06/30/2020 12/24/2019 06/17/2019  Glucose 65 - 99 mg/dL 94 85 75  BUN 8 - 27 mg/dL 16 13 18   Creatinine 0.57 - 1.00 mg/dL 0.80 0.84 0.94  Sodium 134 - 144 mmol/L 141 145(H) 140  Potassium 3.5 - 5.2 mmol/L 4.0 4.1 4.2  Chloride 96 - 106 mmol/L 103 105 100  CO2 20 - 29 mmol/L 25 21 23   Calcium 8.7 - 10.3 mg/dL 9.4 9.8 10.3  Total  Protein 6.0 - 8.5 g/dL - 6.7 7.2  Total Bilirubin 0.0 - 1.2 mg/dL - 0.4 0.4  Alkaline Phos 39 - 117 IU/L - 89 100  AST 0 - 40 IU/L - 21 21  ALT 0 - 32 IU/L - 15 14  CBC Latest Ref Rng & Units 06/29/2020 02/11/2019 12/24/2018  WBC 3.4 - 10.8 x10E3/uL 5.0 8.1 4.9  Hemoglobin 11.1 - 15.9 g/dL 14.1 13.4 12.1  Hematocrit 34.0 - 46.6 % 44.3 41.5 39.2  Platelets 150 - 450 x10E3/uL 143(L) 255 280   Lipid Panel     Component Value Date/Time   CHOL 126 12/24/2019 1218   TRIG 63 12/24/2019 1218   HDL 55 12/24/2019 1218   CHOLHDL 2.3 12/24/2019 1218   CHOLHDL 1.7 10/21/2009 0135   VLDL 11 10/21/2009 0135   LDLCALC 58 12/24/2019 1218   HEMOGLOBIN A1C Lab Results  Component Value Date   HGBA1C 6.3 (H) 06/30/2020   MPG 131 08/24/2008   TSH No results for input(s): TSH in the last 8760 hours. Medications and allergies   Allergies  Allergen Reactions  . Ibuprofen Hives and Swelling  . Penicillins Hives and Swelling    Has patient had a PCN reaction causing immediate rash, facial/tongue/throat swelling, SOB or lightheadedness with hypotension: Yes Has patient had a PCN reaction causing severe rash involving mucus membranes or skin necrosis: No Has patient had a PCN reaction that required hospitalization: No Has patient had a PCN reaction occurring within the last 10 years: No If all of the above answers are "NO", then may proceed with Cephalosporin use.      Current Outpatient Medications  Medication Instructions  . acetaminophen (TYLENOL) 1,000 mg, Oral, Daily PRN  . BYSTOLIC 5 MG tablet TAKE 1 TABLET DAILY  . cholecalciferol (VITAMIN D) 5,000 Units, Oral, Daily, 5,000  . clindamycin (CLEOCIN) 300 MG capsule 1 capsule, Oral, As directed  . diphenhydrAMINE (BENADRYL) 25 mg, Oral, At bedtime PRN  . docusate sodium (COLACE) 100 mg, Oral, 2 times daily  . rosuvastatin (CRESTOR) 10 MG tablet TAKE 1 TABLET DAILY  . triamcinolone ointment (KENALOG) 0.1 % Topical, 2 times daily  .  triamterene-hydrochlorothiazide (DYAZIDE) 37.5-25 MG capsule TAKE 1 CAPSULE DAILY  . zolpidem (AMBIEN) 5 MG tablet TAKE 1 TABLET AT BEDTIME AS NEEDED FOR SLEEP    Radiology:  No results found.  Cardiac Studies:   Lexiscan Myoview stress test 11/04/2008: Normal perfusion, no evidence of ischemia, EF 80%  Coronary Angiogram 2012 No stents placed  TEE 04/11/2016:  LV: Normal. Normal EF. RV: Normal LA: Normal. Left atrial appendage: Normal without thrombus. Normal function. Inter atrial septum is intact without defect. Double contrast study negative for atrial level shunting. No late appearance of bubbles either. RA: Normal MV: Normal Trace MR. TV: Normal Trace TR AV: Normal. No AI or AS. PV: Normal. Trace PI.  Echocardiogram 09/16/2019: Left ventricle cavity is normal in size. Mild concentric hypertrophy of the left ventricle. Normal LV systolic function with EF 55%. Normal global wall motion. Doppler evidence of grade I (impaired) diastolic dysfunction, normal LAP. Left atrial cavity is moderately dilated. Mild to moderate mitral regurgitation. Moderate tricuspid regurgitation. Estimated pulmonary artery systolic pressure is 39 mmHg.  Compared to previous study on 07/04/2017, estimated PASP increased form 31 mmHg to 39 mmHg.  EKG   EKG 12/9/202: sinus rhythm at a rate of 67 bpm, biatrial enlargement.  Normal axis.  Nonspecific T wave abnormality, cannot exclude anterior lateral ischemia.  Low voltage complexes.  Pattern consistent with pulmonary disease.  Compared to EKG 10/20/2019, left atrial enlargement new.  EKG 10/20/2019: Normal sinus rhythm at rate of 70 bpm, right atrial enlargement, T wave abnormality, cannot exclude anterolateral ischemia.  May represent RV strain.  Low-voltage complexes.  Pulmonary disease pattern.  No significant change from 07/29/2018.  Assessment     ICD-10-CM   1. Heart failure due to valvular disease, unspecified heart failure type (HCC)  I50.9 EKG  12-Lead   I38 PCV ECHOCARDIOGRAM COMPLETE  2. Moderate tricuspid regurgitation  I07.1 PCV ECHOCARDIOGRAM COMPLETE  3. Essential hypertension  I10   4. Pure hypercholesterolemia  E78.00    No orders of the defined types were placed in this encounter.  Medications Discontinued During This Encounter  Medication Reason  . colchicine 0.6 MG tablet Completed Course   Recommendations:    [name]  is a 69 y.o.  African-American female with  abnormal EKG suggesting cor pulmonale, mild pulmonary hypertension, essential hypertension, hyperlipidemia, prediabetes mellitus. She quit smoking Jan 2020.    Patient presents for annual follow-up of pulmonary hypertension and valvular heart disease.  Echocardiogram 08/2019 revealed PASP increased from 31 to 39 mmHg.  However patient has remained asymptomatic and continues to refrain from smoking.  In regard to tricuspid and mitral regurgitation physical exam remains unchanged from previous. However in view of moderate valve disease and pulmonary hypertension, as well as new left atrial enlargement on EKG today, will repeat echocardiogram at this time. Patient is without clinical signs of heart failure. Blood pressure is well controlled. Lipids are well controlled.   Encouraged patient to notify the office if she experiences any of the following: dyspnea, leg edema, orthopnea, PND.   Follow up in 1 year unless echo is marked abnormal, sooner if needed.    Alethia Berthold, PA-C 09/29/2020, 3:26 PM Office: [phone]

## 2020-10-19 ENCOUNTER — Other Ambulatory Visit: Payer: Self-pay | Admitting: Internal Medicine

## 2020-10-19 ENCOUNTER — Ambulatory Visit: Payer: Medicare Other | Admitting: Cardiology

## 2020-10-20 NOTE — Telephone Encounter (Signed)
Ambien refill 

## 2020-11-08 ENCOUNTER — Encounter: Payer: Self-pay | Admitting: Cardiology

## 2020-11-16 ENCOUNTER — Other Ambulatory Visit: Payer: Self-pay

## 2020-11-16 ENCOUNTER — Ambulatory Visit: Payer: Medicare Other

## 2020-11-16 DIAGNOSIS — I509 Heart failure, unspecified: Secondary | ICD-10-CM | POA: Diagnosis not present

## 2020-11-16 DIAGNOSIS — I38 Endocarditis, valve unspecified: Secondary | ICD-10-CM

## 2020-11-16 DIAGNOSIS — I071 Rheumatic tricuspid insufficiency: Secondary | ICD-10-CM

## 2020-11-18 NOTE — Progress Notes (Signed)
Left voicemail for patient to call the office back.   

## 2020-11-21 ENCOUNTER — Encounter: Payer: Self-pay | Admitting: Internal Medicine

## 2020-11-21 NOTE — Progress Notes (Signed)
Called and spoke with patient regarding her echocardiogram results.

## 2020-11-21 NOTE — Progress Notes (Signed)
Please inform patient pulmonary artery pressure has improved since 2020. Otherwise her echo is unchanged. Will plan to see her in 1 year, sooner if needed.

## 2020-12-01 ENCOUNTER — Ambulatory Visit: Payer: Self-pay | Admitting: Internal Medicine

## 2020-12-05 NOTE — Progress Notes (Signed)
DUE TO COVID-19 ONLY ONE VISITOR IS ALLOWED TO COME WITH YOU AND STAY IN THE WAITING ROOM ONLY DURING PRE OP AND PROCEDURE DAY OF SURGERY. THE 1 VISITOR  MAY VISIT WITH YOU AFTER SURGERY IN YOUR PRIVATE ROOM DURING VISITING HOURS ONLY!  YOU NEED TO HAVE A COVID 19 TEST ON___2/18/2022 ____ @_______ , THIS TEST MUST BE DONE BEFORE SURGERY,  COVID TESTING SITE [address] [place] [address], IT IS ON THE RIGHT GOING OUT [address] APPROXIMATELY  2 MINUTES PAST [employer] ON THE RIGHT. ONCE YOUR COVID TEST IS COMPLETED,  PLEASE BEGIN THE QUARANTINE INSTRUCTIONS AS OUTLINED IN YOUR HANDOUT.                [name]  12/05/2020   Your procedure is scheduled on: 12/13/2020    Report to [facility] Main  Entrance   Report to admitting at     [facility] AM     Call this number if you have problems the morning of surgery [phone]    REMEMBER: NO  SOLID FOOD CANDY OR GUM AFTER MIDNIGHT. CLEAR LIQUIDS UNTIL   0415am . NOTHING BY MOUTH EXCEPT CLEAR LIQUIDS UNTIL    . PLEASE FINISH ENSURE DRINK PER SURGEON ORDER  WHICH NEEDS TO BE COMPLETED AT   0415am       CLEAR LIQUID DIET   Foods Allowed                                                                    Coffee and tea, regular and decaf                            Fruit ices (not with fruit pulp)                                      Iced Popsicles                                    Carbonated beverages, regular and diet                                    Cranberry, grape and apple juices Sports drinks like Gatorade Lightly seasoned clear broth or consume(fat free) Sugar, honey syrup ___________________________________________________________________      BRUSH YOUR TEETH MORNING OF SURGERY AND RINSE YOUR MOUTH OUT, NO CHEWING GUM CANDY OR MINTS.     Take these medicines the morning of surgery with A SIP OF WATER: Bystolic  DO NOT TAKE ANY DIABETIC MEDICATIONS DAY OF YOUR SURGERY                                You may not have any metal on your body including hair pins and              piercings  Do not wear jewelry, make-up, lotions, powders or perfumes, deodorant  Do not wear nail polish on your fingernails.  Do not shave  48 hours prior to surgery.              Men may shave face and neck.   Do not bring valuables to the hospital. [place].  Contacts, dentures or bridgework may not be worn into surgery.  Leave suitcase in the car. After surgery it may be brought to your room.     Patients discharged the day of surgery will not be allowed to drive home. IF YOU ARE HAVING SURGERY AND GOING HOME THE SAME DAY, YOU MUST HAVE AN ADULT TO DRIVE YOU HOME AND BE WITH YOU FOR 24 HOURS. YOU MAY GO HOME BY TAXI OR UBER OR ORTHERWISE, BUT AN ADULT MUST ACCOMPANY YOU HOME AND STAY WITH YOU FOR 24 HOURS.  Name and phone number of your driver:  Special Instructions: N/A              Please read over the following fact sheets you were given: _____________________________________________________________________  [facility] - Preparing for Surgery Before surgery, you can play an important role.  Because skin is not sterile, your skin needs to be as free of germs as possible.  You can reduce the number of germs on your skin by washing with CHG (chlorahexidine gluconate) soap before surgery.  CHG is an antiseptic cleaner which kills germs and bonds with the skin to continue killing germs even after washing. Please DO NOT use if you have an allergy to CHG or antibacterial soaps.  If your skin becomes reddened/irritated stop using the CHG and inform your nurse when you arrive at Short Stay. Do not shave (including legs and underarms) for at least 48 hours prior to the first CHG shower.  You may shave your face/neck. Please follow these instructions carefully:  1.  Shower with CHG Soap the night before surgery and the  morning of Surgery.  2.   If you choose to wash your hair, wash your hair first as usual with your  normal  shampoo.  3.  After you shampoo, rinse your hair and body thoroughly to remove the  shampoo.                           4.  Use CHG as you would any other liquid soap.  You can apply chg directly  to the skin and wash                       Gently with a scrungie or clean washcloth.  5.  Apply the CHG Soap to your body ONLY FROM THE NECK DOWN.   Do not use on face/ open                           Wound or open sores. Avoid contact with eyes, ears mouth and genitals (private parts).                       Wash face,  Genitals (private parts) with your normal soap.             6.  Wash thoroughly, paying special attention to the area where your surgery  will be performed.  7.  Thoroughly rinse your body with  warm water from the neck down.  8.  DO NOT shower/wash with your normal soap after using and rinsing off  the CHG Soap.                9.  Pat yourself dry with a clean towel.            10.  Wear clean pajamas.            11.  Place clean sheets on your bed the night of your first shower and do not  sleep with pets. Day of Surgery : Do not apply any lotions/deodorants the morning of surgery.  Please wear clean clothes to the hospital/surgery center.  FAILURE TO FOLLOW THESE INSTRUCTIONS MAY RESULT IN THE CANCELLATION OF YOUR SURGERY PATIENT SIGNATURE_________________________________  NURSE SIGNATURE__________________________________  ________________________________________________________________________

## 2020-12-05 NOTE — Progress Notes (Signed)
Anesthesia Review:  PCP: Cardiologist : DR Einar Gip  LOV 09/29/2020 Chest x-ray : EKG :10/02/20  Echo :11/18/2020  Stress test: Cardiac Cath :  Activity level:  Sleep Study/ CPAP : Fasting Blood Sugar :      / Checks Blood Sugar -- times a day:   Blood Thinner/ Instructions /Last Dose: ASA / Instructions/ Last Dose :

## 2020-12-06 ENCOUNTER — Encounter: Payer: Self-pay | Admitting: Internal Medicine

## 2020-12-06 ENCOUNTER — Other Ambulatory Visit: Payer: Self-pay

## 2020-12-06 ENCOUNTER — Ambulatory Visit (INDEPENDENT_AMBULATORY_CARE_PROVIDER_SITE_OTHER): Payer: Medicare Other | Admitting: Internal Medicine

## 2020-12-06 VITALS — BP 110/68 | HR 67 | Temp 97.9°F | Ht 60.0 in | Wt 165.6 lb

## 2020-12-06 DIAGNOSIS — I10 Essential (primary) hypertension: Secondary | ICD-10-CM | POA: Diagnosis not present

## 2020-12-06 DIAGNOSIS — Z01818 Encounter for other preprocedural examination: Secondary | ICD-10-CM

## 2020-12-06 DIAGNOSIS — Z79899 Other long term (current) drug therapy: Secondary | ICD-10-CM | POA: Diagnosis not present

## 2020-12-06 NOTE — Progress Notes (Signed)
I,Katawbba Wiggins,acting as a Education administrator for [name], MD.,have documented all relevant documentation on the behalf of [name], MD,as directed by  [name], MD while in the presence of [name], MD.  This visit occurred during the SARS-CoV-2 public health emergency.  Safety protocols were in place, including screening questions prior to the visit, additional usage of staff PPE, and extensive cleaning of exam room while observing appropriate contact time as indicated for disinfecting solutions.  Subjective:     Patient ID: [name] , female    DOB: 1951-01-23 , 70 y.o.   MRN: [id number]   Chief Complaint  Patient presents with  . surgical clearance    HPI  She is here today for surgical clearance. She is scheduled to have a total knee replacement on 2/22. She was last seen by her cardiologist Dec 2021. She denies having chest pain, shortness of breath, DOE and palpitations. She also denies difficulty walking up stairs (except for knee discomfort).     Past Medical History:  Diagnosis Date  . Anxiety    takes Xanax prn  . Arthritis   . Cataract   . Diverticulosis   . H/O blood transfusion reaction 2010   hives  . H/O migraine    last 38yr ago  . Headache(784.0)   . Heart murmur    takes Bystolic nightly  . History of blood clots 45yr  was on Lovenox injections and Coumadin(was only on that for short period of time)  . History of shingles 3-4y4yrgo  . Hx of seasonal allergies    takes Levocetirizine prn  . Hyperlipidemia    takes Crestor nightly  . Hypertension    takes Maxzide daily  . Insomnia    takes Ambien prn  . Joint pain   . Joint swelling   . Moderate tricuspid regurgitation   . Trigeminal neuralgia of right side of face 03/26/2016   V2 distribution     Family History  Problem Relation Age of Onset  . Heart attack Mother   . Alzheimer's disease Mother   . Diabetes Sister   . Hypertension Sister      Current Outpatient  Medications:  .  Acetaminophen (TYLENOL 8 HOUR ARTHRITIS PAIN PO), Take by mouth., Disp: , Rfl:  .  BYSTOLIC 5 MG tablet, TAKE 1 TABLET DAILY (Patient taking differently: Take 5 mg by mouth daily.), Disp: 90 tablet, Rfl: 3 .  Cholecalciferol (DIALYVITE VITAMIN D 5000) 125 MCG (5000 UT) capsule, Take 5,000 Units by mouth daily., Disp: , Rfl:  .  diphenhydrAMINE (BENADRYL) 25 MG tablet, Take 25 mg by mouth at bedtime as needed for itching. , Disp: , Rfl:  .  docusate sodium (COLACE) 100 MG capsule, Take 1 capsule (100 mg total) by mouth 2 (two) times daily. (Patient taking differently: Take 100 mg by mouth 2 (two) times daily as needed for mild constipation.), Disp: 10 capsule, Rfl: 0 .  rosuvastatin (CRESTOR) 10 MG tablet, TAKE 1 TABLET DAILY (Patient taking differently: Take 10 mg by mouth daily.), Disp: 90 tablet, Rfl: 3 .  traMADol (ULTRAM) 50 MG tablet, Take 50 mg by mouth 2 (two) times daily as needed for pain., Disp: , Rfl:  .  triamterene-hydrochlorothiazide (DYAZIDE) 37.5-25 MG capsule, TAKE 1 CAPSULE DAILY (Patient taking differently: Take 1 capsule by mouth daily.), Disp: 90 capsule, Rfl: 3 .  zolpidem (AMBIEN) 5 MG tablet, TAKE 1 TABLET AT BEDTIME AS NEEDED FOR SLEEP (Patient taking differently:  Take 5 mg by mouth at bedtime as needed for sleep.), Disp: 60 tablet, Rfl: 1   Allergies  Allergen Reactions  . Ibuprofen Hives and Swelling  . Penicillins Hives and Swelling    Has patient had a PCN reaction causing immediate rash, facial/tongue/throat swelling, SOB or lightheadedness with hypotension: Yes Has patient had a PCN reaction causing severe rash involving mucus membranes or skin necrosis: No Has patient had a PCN reaction that required hospitalization: No Has patient had a PCN reaction occurring within the last 10 years: No If all of the above answers are "NO", then may proceed with Cephalosporin use.      Review of Systems  Constitutional: Negative.   Respiratory: Negative.    Cardiovascular: Negative.   Gastrointestinal: Negative.   Neurological: Negative.   Psychiatric/Behavioral: Negative.   All other systems reviewed and are negative.    Today's Vitals   12/06/20 1504  BP: 110/68  Pulse: 67  Temp: 97.9 F (36.6 C)  TempSrc: Oral  Weight: 165 lb 9.6 oz (75.1 kg)  Height: 5' (1.524 m)  PainSc: 6   PainLoc: Knee   Body mass index is 32.34 kg/m.  Wt Readings from Last 3 Encounters:  12/06/20 165 lb 9.6 oz (75.1 kg)  09/29/20 166 lb (75.3 kg)  08/11/20 163 lb 12.8 oz (74.3 kg)   Objective:  Physical Exam Vitals and nursing note reviewed.  Constitutional:      Appearance: Normal appearance. She is obese.  HENT:     Head: Normocephalic and atraumatic.     Nose:     Comments: Masked     Mouth/Throat:     Comments: Masked  Cardiovascular:     Rate and Rhythm: Normal rate and regular rhythm.     Heart sounds: Normal heart sounds.  Pulmonary:     Breath sounds: Normal breath sounds.  Musculoskeletal:     Cervical back: Normal range of motion.  Skin:    General: Skin is warm.  Neurological:     General: No focal deficit present.     Mental Status: She is alert and oriented to person, place, and time.         Assessment And Plan:     1. Essential hypertension Comments: Chronic, well controlled. She will c/w current meds. She is encouraged to follow low sodium diet. Jan 2022 echo results were reviewed in full detail.   2. Pre-operative clearance Comments: EKG reviewed from Dec 2021. She is able to move forward with surgery with acceptable risk. Cardiology/Hospitalist Team should be contacted for peri-operative complications.  - CBC with Diff - CMP14+EGFR  3. Drug therapy   Patient was given opportunity to ask questions. Patient verbalized understanding of the plan and was able to repeat key elements of the plan. All questions were answered to their satisfaction.  [name], MD   I, [name], MD, have reviewed all  documentation for this visit. The documentation on 12/06/20 for the exam, diagnosis, procedures, and orders are all accurate and complete.  THE PATIENT IS ENCOURAGED TO PRACTICE SOCIAL DISTANCING DUE TO THE COVID-19 PANDEMIC.

## 2020-12-06 NOTE — Patient Instructions (Signed)
Preparing for Knee Replacement Getting prepared before knee replacement surgery can make recovery easier and more comfortable. This document provides some tips and guidelines that will help you prepare for your surgery. Talk with your health care provider so you can learn what to expect before, during, and after surgery. Ask questions if you do not understand something. Tell a health care provider about:  Any allergies you have.  All medicines you are taking, including vitamins, herbs, eye drops, creams, and over-the-counter medicines.  Any problems you or family members have had with anesthetic medicines.  Any blood disorders you have.  Any surgeries you have had.  Any medical conditions you have.  Whether you are pregnant or may be pregnant. What happens before the procedure? Visit your health care providers Keep all appointments before surgery. You will need to have a physical exam before surgery (preoperative exam) to make sure it is safe for you to have knee replacement surgery. You may also need to have more tests. When you go to the exam, bring a list of all the medicines and supplements, including herbs and vitamins, that you take. Have dental care and routine cleanings done before surgery. Germs from anywhere in your body, including your mouth, can travel to your new joint and infect it. Tell your dentist that you plan to have knee replacement surgery. Know the costs of surgery To find out how much the surgery will cost, call your insurance company as soon as you decide to have surgery. Ask questions like:  How much of the surgery and hospital stay will be covered?  What will be covered for: ? Medical equipment? ? Rehabilitation facilities? ? Home care? Prepare your home  Pick a recovery spot that is not your bed. It is better that you sit more upright during recovery. You may want to use a recliner.  Place items that you often use on a small table near your recovery spot.  These may include the TV remote, a cordless phone or your mobile phone, a book, a laptop computer, and a water glass.  Move other items you will need to shelves and drawers that are at countertop height. Do this in your kitchen, bathroom, and bedroom.  You may be given a walker to use at home. Check if you will have enough room to use a walker. Move around your home with your hands out about 6 inches (15 cm) from your sides. You will have enough room if you do not hit anything with your hands as you do this. Walk from: ? Your recovery spot to your kitchen and bathroom. ? Your bed to the bathroom.  Prepare some meals to freeze and reheat later. Make your home safe for recovery  Remove all clutter and throw rugs from your floors. This will help you avoid tripping.  Consider getting safety equipment that will be helpful during your recovery, such as: ? Grab bars in the shower and near the toilet. ? A raised toilet seat. This will help you get on and off the toilet more easily. ? A tub or shower bench.      Prepare your body  If you smoke, quit as soon as you can before surgery. If there is time, it is best to quit several months before surgery. Tell your surgeon if you use any products that contain nicotine or tobacco. These products include cigarettes, chewing tobacco, and vaping devices, such as e-cigarettes. These can delay healing. If you need help quitting, ask your health   care provider.  Talk to your health care provider about doing exercises before your surgery. ? Doing these exercises in the weeks before your surgery may help reduce pain and improve function after surgery. ? Be sure to follow the exercise program given by your health care provider.  Maintain a healthy diet. Do not change your diet before surgery unless your health care provider tells you to do that.  Do not drink any alcohol for at least 48 hours before surgery. Plan your recovery In the first couple of weeks  after surgery, it will be harder for you to do some of your regular activities. You may get tired easily, and you will have limited movement in your leg. To make sure you have all the help you need after your surgery:  Plan to have a responsible adult take you home from the hospital. Your health care provider will tell you how many days you can expect to be in the hospital.  Cancel all work, caregiving, and volunteer responsibilities for at least 4-6 weeks after surgery.  Plan to have a responsible adult stay with you day and night for the first week. This person should be someone you are comfortable with. You may need this person to help you with your exercises and personal care, such as bathing and using the toilet.  If you live alone, arrange for someone to take care of your home and pets for the first 4-6 weeks after surgery.  Arrange for drivers to take you to follow-up visits, the grocery store, and other places you may need to go for at least 4-6 weeks.  Consider applying for a disability parking permit. To get an application, call your local department of motor vehicles ([employer]) or your health care provider's office. Summary  Getting prepared before knee replacement surgery can make your recovery easier and more comfortable.  Keep all visits to your health care provider before surgery. You will have an exam and may have tests to make sure that you are ready for your surgery.  Prepare your home and arrange for help at home.  Plan to have a responsible adult take you home from the hospital. Also, plan to have a responsible adult stay with you day and night for the first week after you leave the hospital. This information is not intended to replace advice given to you by your health care provider. Make sure you discuss any questions you have with your health care provider. Document Revised: 03/29/2020 Document Reviewed: 03/29/2020 Elsevier Patient Education  2021 [employer]  

## 2020-12-07 ENCOUNTER — Encounter (HOSPITAL_COMMUNITY)
Admission: RE | Admit: 2020-12-07 | Discharge: 2020-12-07 | Disposition: A | Discharge status: Home / Self Care | Payer: Medicare Other | Source: Ambulatory Visit | Attending: Orthopedic Surgery | Admitting: Orthopedic Surgery

## 2020-12-07 ENCOUNTER — Encounter (HOSPITAL_COMMUNITY): Payer: Self-pay

## 2020-12-07 ENCOUNTER — Other Ambulatory Visit: Payer: Self-pay

## 2020-12-07 DIAGNOSIS — Z01818 Encounter for other preprocedural examination: Secondary | ICD-10-CM | POA: Diagnosis not present

## 2020-12-07 HISTORY — DX: Prediabetes: R73.03

## 2020-12-07 LAB — CMP14+EGFR
ALT: 14 IU/L (ref 0–32)
AST: 21 IU/L (ref 0–40)
Albumin/Globulin Ratio: 1.8 (ref 1.2–2.2)
Albumin: 4.5 g/dL (ref 3.8–4.8)
Alkaline Phosphatase: 90 IU/L (ref 44–121)
BUN/Creatinine Ratio: 20 (ref 12–28)
BUN: 15 mg/dL (ref 8–27)
Bilirubin Total: 0.3 mg/dL (ref 0.0–1.2)
CO2: 24 mmol/L (ref 20–29)
Calcium: 9.9 mg/dL (ref 8.7–10.3)
Chloride: 103 mmol/L (ref 96–106)
Creatinine, Ser: 0.75 mg/dL (ref 0.57–1.00)
GFR calc Af Amer: 93 mL/min/{1.73_m2} (ref >59)
GFR calc non Af Amer: 81 mL/min/{1.73_m2} (ref >59)
Globulin, Total: 2.5 g/dL (ref 1.5–4.5)
Glucose: 86 mg/dL (ref 65–99)
Potassium: 4.4 mmol/L (ref 3.5–5.2)
Sodium: 144 mmol/L (ref 134–144)
Total Protein: 7 g/dL (ref 6.0–8.5)

## 2020-12-07 LAB — CBC WITH DIFFERENTIAL/PLATELET
Basophils Absolute: 0 10*3/uL (ref 0.0–0.2)
Basos: 1 %
EOS (ABSOLUTE): 0.1 10*3/uL (ref 0.0–0.4)
Eos: 3 %
Hematocrit: 45.4 % (ref 34.0–46.6)
Hemoglobin: 14.2 g/dL (ref 11.1–15.9)
Immature Grans (Abs): 0 10*3/uL (ref 0.0–0.1)
Immature Granulocytes: 0 %
Lymphocytes Absolute: 2 10*3/uL (ref 0.7–3.1)
Lymphs: 44 %
MCH: 27.2 pg (ref 26.6–33.0)
MCHC: 31.3 g/dL — ABNORMAL LOW (ref 31.5–35.7)
MCV: 87 fL (ref 79–97)
Monocytes Absolute: 0.5 10*3/uL (ref 0.1–0.9)
Monocytes: 11 %
Neutrophils Absolute: 1.9 10*3/uL (ref 1.4–7.0)
Neutrophils: 41 %
Platelets: 220 10*3/uL (ref 150–450)
RBC: 5.22 x10E6/uL (ref 3.77–5.28)
RDW: 13.9 % (ref 11.7–15.4)
WBC: 4.5 10*3/uL (ref 3.4–10.8)

## 2020-12-07 LAB — SURGICAL PCR SCREEN
MRSA, PCR: NEGATIVE
Staphylococcus aureus: NEGATIVE

## 2020-12-07 NOTE — Progress Notes (Addendum)
Anesthesia Review:  PCP: DR Glendale Chard [facility] on 12/06/2020- for surgical clearance  Labs of CMP and CBC are in epic from this visit of 12/06/2020.   Cardiologist : DR Einar Gip [facility] 09/29/2020  Chest x-ray : EKG :10/02/2020  Echo : 11/18/2020  Stress test: Cardiac Cath :  Activity level:  Can do a flight of stairs without difficulty  Sleep Study/ CPAP : no  Fasting Blood Sugar :      / Checks Blood Sugar -- times a day:   Blood Thinner/ Instructions /Last Dose: ASA / Instructions/ Last Dose :  Prediabetes does not check glucose at home On no meds  HGBA1c-12/07/20- 6.1

## 2020-12-08 LAB — HEMOGLOBIN A1C
Hgb A1c MFr Bld: 6.1 % — ABNORMAL HIGH (ref 4.8–5.6)
Mean Plasma Glucose: 128 mg/dL

## 2020-12-08 NOTE — Anesthesia Preprocedure Evaluation (Addendum)
Anesthesia Evaluation  Patient identified by MRN, date of birth, ID band Patient awake    Reviewed: Allergy & Precautions, NPO status , Patient's Chart, lab work & pertinent test results, reviewed documented beta blocker date and time   Airway Mallampati: II  TM Distance: >3 FB Neck ROM: Full    Dental no notable dental hx. (+) Teeth Intact   Pulmonary former smoker,    Pulmonary exam normal breath sounds clear to auscultation       Cardiovascular hypertension, Pt. on medications and Pt. on home beta blockers + DVT  Normal cardiovascular exam+ Valvular Problems/Murmurs  Rhythm:Regular Rate:Normal  Stress test 05/31/2015 Normal perfusion scan  Echo 03/2015 Mild MR and trivial TR  EKG 10/02/20 NSR, non specific ST-T wave abnormalities   Neuro/Psych  Headaches,  Neuromuscular disease negative psych ROS   GI/Hepatic negative GI ROS, Neg liver ROS,   Endo/Other  Hyperlipidemia Obesity  Renal/GU negative Renal ROS  negative genitourinary   Musculoskeletal  (+) Arthritis , Osteoarthritis,  DJD right knee   Abdominal (+) + obese,   Peds  Hematology  (+) anemia ,   Anesthesia Other Findings   Reproductive/Obstetrics                           Anesthesia Physical Anesthesia Plan  ASA: II  Anesthesia Plan: General   Post-op Pain Management:  Regional for Post-op pain   Induction: Intravenous  PONV Risk Score and Plan: 3 and Propofol infusion, Treatment may vary due to age or medical condition and Ondansetron  Airway Management Planned: Oral ETT  Additional Equipment:   Intra-op Plan:   Post-operative Plan: Extubation in OR  Informed Consent: I have reviewed the patients History and Physical, chart, labs and discussed the procedure including the risks, benefits and alternatives for the proposed anesthesia with the patient or authorized representative who has indicated his/her  understanding and acceptance.     Dental advisory given  Plan Discussed with: CRNA and Anesthesiologist  Anesthesia Plan Comments: (See PAT note 12/07/2020, Konrad Felix, PA-C. Patient does not want SAB for procedure. )    Anesthesia Quick Evaluation

## 2020-12-08 NOTE — Progress Notes (Signed)
Anesthesia Chart Review   Case: [id number] Date/Time: 12/13/20 0700   Procedure: TOTAL KNEE ARTHROPLASTY (Right Knee) - 70 mins   Anesthesia type: Spinal   Pre-op diagnosis: Right knee osteoarthritis   Location: [facility] ROOM 09 / [facility]   Surgeons: Paralee Cancel, MD      DISCUSSION:70 y.o. former smoker with h/o HTN, pulmonary HTN, moderate tricuspid regurgitation, right knee OA scheduled for above procedure 12/13/20 with Dr. Paralee Cancel.   Pt seen by PCP 12/06/2020. Per OV note, "Comments: EKG reviewed from Dec 2021. She is able to move forward with surgery with acceptable risk. Cardiology/Hospitalist Team should be contacted for peri-operative complications."  Pt last seen by cardiology 09/29/2020. Repeat Echo ordered at this visit due to moderate valvular disease and new left atrial enlargement seen on EKG.  1 year follow up recommend. Echo 11/16/2020 which showed EF 55%, mild to moderate mitral regurgitation, mild tricuspid regurgitation. Compared to previous study in 2020, PASP decreased from 39 mmHg.  Anticipate pt can proceed with planned procedure barring acute status change.   VS: BP 104/76   Pulse 68   Temp 36.9 C (Oral)   Resp 16   SpO2 100%   PROVIDERS: Glendale Chard, MD is PCP    LABS: Labs reviewed: Acceptable for surgery. (all labs ordered are listed, but only abnormal results are displayed)  Labs Reviewed  HEMOGLOBIN A1C - Abnormal; Notable for the following components:      Result Value   Hgb A1c MFr Bld 6.1 (*)    All other components within normal limits  SURGICAL PCR SCREEN  TYPE AND SCREEN     IMAGES:   EKG: EKG 09/29/2020: Normal sinus rhythm with rate of 67 bpm, possible right atrial enlargement.  Nonspecific T abnormality, cannot exclude inferior and anterolateral ischemia.  Low-voltage complexes.   CV: Echo 11/16/2020 Echocardiogram 11/16/2020:  Left ventricle cavity is normal in size and wall thickness. Normal global  wall motion. Normal LV systolic  function with EF 55%. Doppler evidence of  grade I (impaired) diastolic dysfunction, normal LAP.  Mild to moderate mitral regurgitation.  Mild tricuspid regurgitation. Estimated pulmonary artery systolic pressure  24 mmHg.  Compared to previous study in 2020, PASP decreased from 39 mmHg.    Past Medical History:  Diagnosis Date  . Arthritis   . Cataract   . Diverticulosis   . H/O blood transfusion reaction 2010   hives  . H/O migraine    last 51yrs ago  . Headache(784.0)   . Heart murmur    takes Bystolic nightly  . History of blood clots 25yrs   was on Lovenox injections and Coumadin(was only on that for short period of time)  . History of shingles 3-56yrs ago  . Hx of seasonal allergies    takes Levocetirizine prn  . Hyperlipidemia    takes Crestor nightly  . Hypertension    takes Maxzide daily  . Insomnia    takes Ambien prn  . Joint pain   . Joint swelling   . Moderate tricuspid regurgitation   . Pre-diabetes   . Trigeminal neuralgia of right side of face 03/26/2016   V2 distribution    Past Surgical History:  Procedure Laterality Date  . ABDOMINAL HYSTERECTOMY    . BREAST BIOPSY  05/03/2006   [place]  . BREAST REDUCTION SURGERY  1987  . BUNIONECTOMY Right 05/11/2019  . CATARACT EXTRACTION Left 07/2019  . CHOLECYSTECTOMY    . COLONOSCOPY    . ESOPHAGOGASTRODUODENOSCOPY    .  EYE SURGERY Right    eye lash removed  . JOINT REPLACEMENT     bilateral hip rt in 2007 and lt in 2010  . NM MYOCAR PERF WALL MOTION  11/04/2008   Normal  . REDUCTION MAMMAPLASTY Bilateral 1988  . REDUCTION MAMMAPLASTY    . SHOULDER ARTHROSCOPY WITH DISTAL CLAVICLE RESECTION Right 09/21/2015   Procedure: RIGHT SHOULDER ARTHROSCOPY WITH DISTAL CLAVICLE EXCISION DEBRIDE LABRAL TEAR ACROMIOPLASTY ;  Surgeon: Frederik Pear, MD;  Location: [place];  Service: Orthopedics;  Laterality: Right;  . TEE WITHOUT CARDIOVERSION N/A 04/12/2015   Procedure: TRANSESOPHAGEAL ECHOCARDIOGRAM  (TEE)/BUBBLE STUDY;  Surgeon: Adrian Prows, MD;  Location: [place];  Service: Cardiovascular;  Laterality: N/A;  . TOTAL HIP REVISION  04/14/2012   Procedure: TOTAL HIP REVISION;  Surgeon: Kerin Salen, MD;  Location: [place];  Service: Orthopedics;  Laterality: Right;  right total hip revision  . TOTAL HIP REVISION Left 11/30/2013   Procedure: TOTAL HIP REVISION;  Surgeon: Kerin Salen, MD;  Location: [place];  Service: Orthopedics;  Laterality: Left;    MEDICATIONS: . Acetaminophen (TYLENOL 8 HOUR ARTHRITIS PAIN PO)  . BYSTOLIC 5 MG tablet  . Cholecalciferol (DIALYVITE VITAMIN D 5000) 125 MCG (5000 UT) capsule  . diphenhydrAMINE (BENADRYL) 25 MG tablet  . docusate sodium (COLACE) 100 MG capsule  . rosuvastatin (CRESTOR) 10 MG tablet  . traMADol (ULTRAM) 50 MG tablet  . triamterene-hydrochlorothiazide (DYAZIDE) 37.5-25 MG capsule  . zolpidem (AMBIEN) 5 MG tablet   No current facility-administered medications for this encounter.    Konrad Felix, PA-C [facility] [phone]

## 2020-12-09 ENCOUNTER — Other Ambulatory Visit (HOSPITAL_COMMUNITY)
Admission: RE | Admit: 2020-12-09 | Discharge: 2020-12-09 | Disposition: A | Discharge status: Home / Self Care | Payer: Medicare Other | Source: Ambulatory Visit | Attending: Orthopedic Surgery | Admitting: Orthopedic Surgery

## 2020-12-09 DIAGNOSIS — Z20822 Contact with and (suspected) exposure to covid-19: Secondary | ICD-10-CM | POA: Diagnosis not present

## 2020-12-09 DIAGNOSIS — Z01812 Encounter for preprocedural laboratory examination: Secondary | ICD-10-CM | POA: Insufficient documentation

## 2020-12-09 LAB — SARS CORONAVIRUS 2 (TAT 6-24 HRS): SARS Coronavirus 2: NEGATIVE

## 2020-12-11 NOTE — H&P (Signed)
TOTAL KNEE ADMISSION H&P  Patient is being admitted for right total knee arthroplasty.  Subjective:  Chief Complaint:   Right knee OA / pain.  HPI: [name], 70 y.o. female, has a history of pain and functional disability in the right knee due to arthritis and has failed non-surgical conservative treatments for greater than 12 weeks to include NSAID's and/or analgesics, corticosteriod injections and activity modification.  Onset of symptoms was gradual, starting 2+ years ago with gradually worsening course since that time. The patient noted prior procedures on the knee to include  arthroscopy on the right knee(s).  Patient currently rates pain in the right knee(s) at 10 out of 10 with activity. Patient has night pain, worsening of pain with activity and weight bearing, pain that interferes with activities of daily living, pain with passive range of motion, crepitus and joint swelling.  Patient has evidence of periarticular osteophytes and joint space narrowing by imaging studies.  There is no active infection.  Risks, benefits and expectations were discussed with the patient.  Risks including but not limited to the risk of anesthesia, blood clots, nerve damage, blood vessel damage, failure of the prosthesis, infection and up to and including death.  Patient understand the risks, benefits and expectations and wishes to proceed with surgery.   D/C Plans:       Home   Post-op Meds:       No Rx given   Tranexamic Acid:      To be given - IV   Decadron:      Is to be given  FYI:      Xarelto x 2 weeks (No ASA/NSAIDs)  Norco  No Celebrex - swelling with NSAIDs  DME:   Pt equipment arranged  PT:   [facility]PT  @ [facility]  Pharmacy: [facility]    Patient Active Problem List   Diagnosis Date Noted  . Moderate tricuspid regurgitation 10/20/2019  . Bunion of great toe of right foot 01/12/2019  . Cellulitis 12/23/2018  . Foot infection   . Great toe pain, right 12/17/2018  .  Prediabetes 12/17/2018  . Cellulitis of foot 08/23/2017  . Hypokalemia 08/23/2017  . Trigeminal neuralgia of right side of face 03/26/2016  . Hyperlipidemia 12/04/2013  . Hypertension 12/04/2013  . Constipation 12/04/2013  . Insomnia 12/04/2013  . Acute blood loss anemia 12/04/2013  . Left hip pain 12/04/2013  . 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 Pain due to total hip replacement ([place]) 04/16/2012   Past Medical History:  Diagnosis Date  . Arthritis   . Cataract   . Diverticulosis   . H/O blood transfusion reaction 2010   hives  . H/O migraine    last 68yr ago  . Headache(784.0)   . Heart murmur    takes Bystolic nightly  . History of blood clots 439yr  was on Lovenox injections and Coumadin(was only on that for short period of time)  . History of shingles [id number]  . Hx of seasonal allergies    takes Levocetirizine prn  . Hyperlipidemia    takes Crestor nightly  . Hypertension    takes Maxzide daily  . Insomnia    takes Ambien prn  . Joint pain   . Joint swelling   . Moderate tricuspid regurgitation   . Pre-diabetes   . Trigeminal neuralgia of right side of face 03/26/2016   V2 distribution    Past Surgical History:  Procedure Laterality Date  .  ABDOMINAL HYSTERECTOMY    . BREAST BIOPSY  05/03/2006   [place]  . BREAST REDUCTION SURGERY  1987  . BUNIONECTOMY Right 05/11/2019  . CATARACT EXTRACTION Left 07/2019  . CHOLECYSTECTOMY    . COLONOSCOPY    . ESOPHAGOGASTRODUODENOSCOPY    . EYE SURGERY Right    eye lash removed  . JOINT REPLACEMENT     bilateral hip rt in 2007 and lt in 2010  . NM MYOCAR PERF WALL MOTION  11/04/2008   Normal  . REDUCTION MAMMAPLASTY Bilateral 1988  . REDUCTION MAMMAPLASTY    . SHOULDER ARTHROSCOPY WITH DISTAL CLAVICLE RESECTION Right 09/21/2015   Procedure: RIGHT SHOULDER ARTHROSCOPY WITH DISTAL CLAVICLE EXCISION DEBRIDE LABRAL TEAR ACROMIOPLASTY ;  Surgeon: Frederik Pear, MD;  Location: [facility];  Service:  Orthopedics;  Laterality: Right;  . TEE WITHOUT CARDIOVERSION N/A 04/12/2015   Procedure: TRANSESOPHAGEAL ECHOCARDIOGRAM (TEE)/BUBBLE STUDY;  Surgeon: Adrian Prows, MD;  Location: [facility];  Service: Cardiovascular;  Laterality: N/A;  . TOTAL HIP REVISION  04/14/2012   Procedure: TOTAL HIP REVISION;  Surgeon: Kerin Salen, MD;  Location: [facility];  Service: Orthopedics;  Laterality: Right;  right total hip revision  . TOTAL HIP REVISION Left 11/30/2013   Procedure: TOTAL HIP REVISION;  Surgeon: Kerin Salen, MD;  Location: [facility];  Service: Orthopedics;  Laterality: Left;    No current facility-administered medications for this encounter.   Current Outpatient Medications  Medication Sig Dispense Refill Last Dose  . BYSTOLIC 5 MG tablet TAKE 1 TABLET DAILY (Patient taking differently: Take 5 mg by mouth daily.) 90 tablet 3   . Cholecalciferol (DIALYVITE VITAMIN D 5000) 125 MCG (5000 UT) capsule Take 5,000 Units by mouth daily.     . diphenhydrAMINE (BENADRYL) 25 MG tablet Take 25 mg by mouth at bedtime as needed for itching.      . docusate sodium (COLACE) 100 MG capsule Take 1 capsule (100 mg total) by mouth 2 (two) times daily. (Patient taking differently: Take 100 mg by mouth 2 (two) times daily as needed for mild constipation.) 10 capsule 0   . rosuvastatin (CRESTOR) 10 MG tablet TAKE 1 TABLET DAILY (Patient taking differently: Take 10 mg by mouth daily.) 90 tablet 3   . traMADol (ULTRAM) 50 MG tablet Take 50 mg by mouth 2 (two) times daily as needed for pain.     Marland Kitchen triamterene-hydrochlorothiazide (DYAZIDE) 37.5-25 MG capsule TAKE 1 CAPSULE DAILY (Patient taking differently: Take 1 capsule by mouth daily.) 90 capsule 3   . zolpidem (AMBIEN) 5 MG tablet TAKE 1 TABLET AT BEDTIME AS NEEDED FOR SLEEP (Patient taking differently: Take 5 mg by mouth at bedtime as needed for sleep.) 60 tablet 1   . Acetaminophen (TYLENOL 8 HOUR ARTHRITIS PAIN PO) Take by mouth.      Allergies  Allergen Reactions  .  Ibuprofen Hives and Swelling  . Penicillins Hives and Swelling    Has patient had a PCN reaction causing immediate rash, facial/tongue/throat swelling, SOB or lightheadedness with hypotension: Yes Has patient had a PCN reaction causing severe rash involving mucus membranes or skin necrosis: No Has patient had a PCN reaction that required hospitalization: No Has patient had a PCN reaction occurring within the last 10 years: No If all of the above answers are "NO", then may proceed with Cephalosporin use.     Social History   Tobacco Use  . Smoking status: Former Smoker    Packs/day: 0.25    Years: 42.00  Pack years: 10.50    Types: Cigarettes    Quit date: 10/22/2018    Years since quitting: 2.1  . Smokeless tobacco: Never Used  Substance Use Topics  . Alcohol use: Not Currently    Comment: lst dirnk 2 years ago     Family History  Problem Relation Age of Onset  . Heart attack Mother   . Alzheimer's disease Mother   . Diabetes Sister   . Hypertension Sister      Review of Systems  Constitutional: Negative.   HENT: Negative.   Eyes: Negative.   Respiratory: Negative.   Cardiovascular: Negative.   Gastrointestinal: Positive for constipation.  Genitourinary: Negative.   Musculoskeletal: Positive for joint pain.  Skin: Negative.   Neurological: Negative.   Endo/Heme/Allergies: Negative.   Psychiatric/Behavioral: The patient has insomnia.      Objective:  Physical Exam Constitutional:      Appearance: She is well-developed.  HENT:     Head: Normocephalic.  Eyes:     Pupils: Pupils are equal, round, and reactive to light.  Neck:     Thyroid: No thyromegaly.     Vascular: No JVD.     Trachea: No tracheal deviation.  Cardiovascular:     Rate and Rhythm: Normal rate and regular rhythm.     Pulses: Intact distal pulses.     Heart sounds: Murmur heard.    Pulmonary:     Effort: Pulmonary effort is normal. No respiratory distress.     Breath sounds: Normal  breath sounds. No wheezing.  Abdominal:     Palpations: Abdomen is soft.     Tenderness: There is no abdominal tenderness. There is no guarding.  Musculoskeletal:     Cervical back: Neck supple.     Right knee: Swelling and bony tenderness present. No erythema or ecchymosis. Decreased range of motion. Tenderness present.  Lymphadenopathy:     Cervical: No cervical adenopathy.  Skin:    General: Skin is warm and dry.  Neurological:     Mental Status: She is alert and oriented to person, place, and time.  Psychiatric:        Mood and Affect: Mood and affect normal.      Labs:  Estimated body mass index is 32.34 kg/m as calculated from the following:   Height as of 12/06/20: 5' (1.524 m).   Weight as of 12/06/20: 75.1 kg.   Imaging Review Plain radiographs demonstrate severe degenerative joint disease of the right knee(s).The bone quality appears to be good for age and reported activity level.      Assessment/Plan:  End stage arthritis, right knee   The patient history, physical examination, clinical judgment of the provider and imaging studies are consistent with end stage degenerative joint disease of the right knee(s) and total knee arthroplasty is deemed medically necessary. The treatment options including medical management, injection therapy arthroscopy and arthroplasty were discussed at length. The risks and benefits of total knee arthroplasty were presented and reviewed. The risks due to aseptic loosening, infection, stiffness, patella tracking problems, thromboembolic complications and other imponderables were discussed. The patient acknowledged the explanation, agreed to proceed with the plan and consent was signed. Patient is being admitted for treatment for surgery, pain control, PT, OT, prophylactic antibiotics, VTE prophylaxis, progressive ambulation and ADL's and discharge planning. The patient is planning to be discharged home.    Patient's anticipated LOS is less  than 2 midnights, meeting these requirements: - Lives within 1 hour of care - Has a  competent adult at home to recover with post-op recover - NO history of  - Chronic pain requiring opiods  - Diabetes  - Coronary Artery Disease  - Heart failure  - Heart attack  - Stroke  - Cardiac arrhythmia  - Respiratory Failure/COPD  - Renal failure  - Anemia  - Advanced Liver disease

## 2020-12-13 ENCOUNTER — Encounter (HOSPITAL_COMMUNITY): Payer: Self-pay | Admitting: Orthopedic Surgery

## 2020-12-13 ENCOUNTER — Observation Stay (HOSPITAL_COMMUNITY)
Admission: RE | Admit: 2020-12-13 | Discharge: 2020-12-14 | Disposition: A | Discharge status: Home / Self Care | Payer: Medicare Other | Attending: Orthopedic Surgery | Admitting: Orthopedic Surgery

## 2020-12-13 ENCOUNTER — Ambulatory Visit (HOSPITAL_COMMUNITY): Payer: Medicare Other | Admitting: Certified Registered"

## 2020-12-13 ENCOUNTER — Other Ambulatory Visit: Payer: Self-pay

## 2020-12-13 ENCOUNTER — Encounter (HOSPITAL_COMMUNITY)
Admission: RE | Disposition: A | Discharge status: Home / Self Care | Payer: Self-pay | Source: Home / Self Care | Attending: Orthopedic Surgery

## 2020-12-13 ENCOUNTER — Ambulatory Visit (HOSPITAL_COMMUNITY): Payer: Medicare Other | Admitting: Physician Assistant

## 2020-12-13 DIAGNOSIS — Z6832 Body mass index (BMI) 32.0-32.9, adult: Secondary | ICD-10-CM | POA: Diagnosis not present

## 2020-12-13 DIAGNOSIS — M659 Synovitis and tenosynovitis, unspecified: Secondary | ICD-10-CM | POA: Diagnosis not present

## 2020-12-13 DIAGNOSIS — E669 Obesity, unspecified: Secondary | ICD-10-CM | POA: Diagnosis present

## 2020-12-13 DIAGNOSIS — M1711 Unilateral primary osteoarthritis, right knee: Principal | ICD-10-CM | POA: Insufficient documentation

## 2020-12-13 DIAGNOSIS — Z87891 Personal history of nicotine dependence: Secondary | ICD-10-CM | POA: Insufficient documentation

## 2020-12-13 DIAGNOSIS — Z880 Allergy status to penicillin: Secondary | ICD-10-CM | POA: Diagnosis not present

## 2020-12-13 DIAGNOSIS — G8918 Other acute postprocedural pain: Secondary | ICD-10-CM | POA: Diagnosis not present

## 2020-12-13 DIAGNOSIS — R7303 Prediabetes: Secondary | ICD-10-CM | POA: Diagnosis not present

## 2020-12-13 DIAGNOSIS — I10 Essential (primary) hypertension: Secondary | ICD-10-CM | POA: Insufficient documentation

## 2020-12-13 DIAGNOSIS — M25461 Effusion, right knee: Secondary | ICD-10-CM | POA: Diagnosis not present

## 2020-12-13 DIAGNOSIS — Z79899 Other long term (current) drug therapy: Secondary | ICD-10-CM | POA: Insufficient documentation

## 2020-12-13 DIAGNOSIS — Z96651 Presence of right artificial knee joint: Secondary | ICD-10-CM

## 2020-12-13 DIAGNOSIS — M25761 Osteophyte, right knee: Secondary | ICD-10-CM | POA: Diagnosis not present

## 2020-12-13 HISTORY — PX: TOTAL KNEE ARTHROPLASTY: SHX125

## 2020-12-13 LAB — CBC
HCT: 44.2 % (ref 36.0–46.0)
Hemoglobin: 13.6 g/dL (ref 12.0–15.0)
MCH: 27.1 pg (ref 26.0–34.0)
MCHC: 30.8 g/dL (ref 30.0–36.0)
MCV: 88 fL (ref 80.0–100.0)
Platelets: 161 10*3/uL (ref 150–400)
RBC: 5.02 MIL/uL (ref 3.87–5.11)
RDW: 15.3 % (ref 11.5–15.5)
WBC: 6.9 10*3/uL (ref 4.0–10.5)
nRBC: 0 % (ref 0.0–0.2)

## 2020-12-13 LAB — TYPE AND SCREEN
ABO/RH(D): O POS
Antibody Screen: NEGATIVE

## 2020-12-13 LAB — GLUCOSE, CAPILLARY: Glucose-Capillary: 84 mg/dL (ref 70–99)

## 2020-12-13 SURGERY — ARTHROPLASTY, KNEE, TOTAL
Anesthesia: General | Site: Knee | Laterality: Right

## 2020-12-13 MED ORDER — CEFAZOLIN SODIUM-DEXTROSE 2-4 GM/100ML-% IV SOLN
2.0000 g | INTRAVENOUS | Status: AC
  Administered 2020-12-13: 2 g via INTRAVENOUS

## 2020-12-13 MED ORDER — ROPIVACAINE HCL 7.5 MG/ML IJ SOLN
INTRAMUSCULAR | Status: DC | PRN
  Administered 2020-12-13: 20 mL via PERINEURAL

## 2020-12-13 MED ORDER — HYDROCODONE-ACETAMINOPHEN 5-325 MG PO TABS
1.0000 | ORAL_TABLET | ORAL | Status: DC | PRN
  Administered 2020-12-13 – 2020-12-14 (×5): 2 via ORAL
  Filled 2020-12-13 (×5): qty 2

## 2020-12-13 MED ORDER — SODIUM CHLORIDE (PF) 0.9 % IJ SOLN
INTRAMUSCULAR | Status: DC | PRN
  Administered 2020-12-13: 30 mL

## 2020-12-13 MED ORDER — FERROUS SULFATE 325 (65 FE) MG PO TABS
325.0000 mg | ORAL_TABLET | Freq: Two times a day (BID) | ORAL | Status: DC
  Administered 2020-12-14: 325 mg via ORAL
  Filled 2020-12-13: qty 1

## 2020-12-13 MED ORDER — HYDROMORPHONE HCL 1 MG/ML IJ SOLN
INTRAMUSCULAR | Status: AC
  Filled 2020-12-13: qty 1

## 2020-12-13 MED ORDER — METHOCARBAMOL 500 MG IVPB - SIMPLE MED
INTRAVENOUS | Status: AC
  Filled 2020-12-13: qty 50

## 2020-12-13 MED ORDER — CEFAZOLIN SODIUM-DEXTROSE 2-4 GM/100ML-% IV SOLN
2.0000 g | Freq: Four times a day (QID) | INTRAVENOUS | Status: AC
Start: 2020-12-13 — End: 2020-12-13
  Administered 2020-12-13 (×2): 2 g via INTRAVENOUS
  Filled 2020-12-13: qty 100

## 2020-12-13 MED ORDER — METOCLOPRAMIDE HCL 5 MG/ML IJ SOLN: 5.0000 mg | Freq: Three times a day (TID) | INTRAMUSCULAR | Status: DC | PRN

## 2020-12-13 MED ORDER — FENTANYL CITRATE (PF) 100 MCG/2ML IJ SOLN
INTRAMUSCULAR | Status: AC
  Filled 2020-12-13: qty 2

## 2020-12-13 MED ORDER — ONDANSETRON HCL 4 MG/2ML IJ SOLN
INTRAMUSCULAR | Status: DC | PRN
  Administered 2020-12-13: 4 mg via INTRAVENOUS

## 2020-12-13 MED ORDER — FENTANYL CITRATE (PF) 100 MCG/2ML IJ SOLN
INTRAMUSCULAR | Status: AC
  Filled 2020-12-13: qty 4

## 2020-12-13 MED ORDER — METHOCARBAMOL 500 MG PO TABS
500.0000 mg | ORAL_TABLET | Freq: Four times a day (QID) | ORAL | Status: DC | PRN
  Administered 2020-12-13 – 2020-12-14 (×2): 500 mg via ORAL
  Filled 2020-12-13 (×3): qty 1

## 2020-12-13 MED ORDER — ONDANSETRON HCL 4 MG/2ML IJ SOLN
INTRAMUSCULAR | Status: AC
  Filled 2020-12-13: qty 2

## 2020-12-13 MED ORDER — ZOLPIDEM TARTRATE 5 MG PO TABS
5.0000 mg | ORAL_TABLET | Freq: Every evening | ORAL | Status: DC | PRN
  Administered 2020-12-13: 5 mg via ORAL
  Filled 2020-12-13: qty 1

## 2020-12-13 MED ORDER — DEXAMETHASONE SODIUM PHOSPHATE 10 MG/ML IJ SOLN
INTRAMUSCULAR | Status: AC
  Filled 2020-12-13: qty 1

## 2020-12-13 MED ORDER — LACTATED RINGERS IV SOLN: INTRAVENOUS | Status: DC

## 2020-12-13 MED ORDER — ONDANSETRON HCL 4 MG PO TABS: 4.0000 mg | ORAL_TABLET | Freq: Four times a day (QID) | ORAL | Status: DC | PRN

## 2020-12-13 MED ORDER — DIPHENHYDRAMINE HCL 12.5 MG/5ML PO ELIX: 12.5000 mg | ORAL_SOLUTION | ORAL | Status: DC | PRN

## 2020-12-13 MED ORDER — PHENYLEPHRINE 40 MCG/ML (10ML) SYRINGE FOR IV PUSH (FOR BLOOD PRESSURE SUPPORT)
PREFILLED_SYRINGE | INTRAVENOUS | Status: AC
  Filled 2020-12-13: qty 10

## 2020-12-13 MED ORDER — MAGNESIUM CITRATE PO SOLN: 1.0000 | Freq: Once | ORAL | Status: DC | PRN

## 2020-12-13 MED ORDER — ROSUVASTATIN CALCIUM 10 MG PO TABS
10.0000 mg | ORAL_TABLET | Freq: Every day | ORAL | Status: DC
  Administered 2020-12-14: 10 mg via ORAL
  Filled 2020-12-13: qty 1

## 2020-12-13 MED ORDER — LIDOCAINE HCL (PF) 2 % IJ SOLN
INTRAMUSCULAR | Status: AC
  Filled 2020-12-13: qty 5

## 2020-12-13 MED ORDER — POVIDONE-IODINE 10 % EX SWAB
2.0000 "application " | Freq: Once | CUTANEOUS | Status: AC
  Administered 2020-12-13: 2 via TOPICAL

## 2020-12-13 MED ORDER — TRANEXAMIC ACID-NACL 1000-0.7 MG/100ML-% IV SOLN
INTRAVENOUS | Status: AC
  Filled 2020-12-13: qty 100

## 2020-12-13 MED ORDER — METHOCARBAMOL 500 MG IVPB - SIMPLE MED
500.0000 mg | Freq: Four times a day (QID) | INTRAVENOUS | Status: DC | PRN
  Administered 2020-12-13: 500 mg via INTRAVENOUS
  Filled 2020-12-13: qty 50

## 2020-12-13 MED ORDER — CEFAZOLIN SODIUM-DEXTROSE 2-4 GM/100ML-% IV SOLN
INTRAVENOUS | Status: AC
  Filled 2020-12-13: qty 100

## 2020-12-13 MED ORDER — SODIUM CHLORIDE 0.9 % IR SOLN
Status: DC | PRN
  Administered 2020-12-13: 1000 mL

## 2020-12-13 MED ORDER — BISACODYL 10 MG RE SUPP: 10.0000 mg | Freq: Every day | RECTAL | Status: DC | PRN

## 2020-12-13 MED ORDER — DOCUSATE SODIUM 100 MG PO CAPS
100.0000 mg | ORAL_CAPSULE | Freq: Two times a day (BID) | ORAL | Status: DC
  Administered 2020-12-13 – 2020-12-14 (×2): 100 mg via ORAL
  Filled 2020-12-13 (×2): qty 1

## 2020-12-13 MED ORDER — STERILE WATER FOR IRRIGATION IR SOLN
Status: DC | PRN
  Administered 2020-12-13: 2000 mL

## 2020-12-13 MED ORDER — PROPOFOL 10 MG/ML IV BOLUS
INTRAVENOUS | Status: AC
  Filled 2020-12-13: qty 20

## 2020-12-13 MED ORDER — MORPHINE SULFATE (PF) 2 MG/ML IV SOLN
0.5000 mg | INTRAVENOUS | Status: DC | PRN
Start: 2020-12-13 — End: 2020-12-14
  Filled 2020-12-13: qty 1

## 2020-12-13 MED ORDER — TRANEXAMIC ACID-NACL 1000-0.7 MG/100ML-% IV SOLN
1000.0000 mg | INTRAVENOUS | Status: AC
  Administered 2020-12-13: 1000 mg via INTRAVENOUS

## 2020-12-13 MED ORDER — SODIUM CHLORIDE 0.9 % IV SOLN: INTRAVENOUS | Status: DC

## 2020-12-13 MED ORDER — LIDOCAINE 2% (20 MG/ML) 5 ML SYRINGE
INTRAMUSCULAR | Status: DC | PRN
  Administered 2020-12-13: 40 mg via INTRAVENOUS
  Administered 2020-12-13: 60 mg via INTRAVENOUS

## 2020-12-13 MED ORDER — BUPIVACAINE-EPINEPHRINE (PF) 0.25% -1:200000 IJ SOLN
INTRAMUSCULAR | Status: AC
  Filled 2020-12-13: qty 30

## 2020-12-13 MED ORDER — HYDROCODONE-ACETAMINOPHEN 7.5-325 MG PO TABS
1.0000 | ORAL_TABLET | ORAL | Status: DC | PRN
  Administered 2020-12-14: 2 via ORAL
  Filled 2020-12-13: qty 2

## 2020-12-13 MED ORDER — DEXAMETHASONE SODIUM PHOSPHATE 10 MG/ML IJ SOLN
10.0000 mg | Freq: Once | INTRAMUSCULAR | Status: AC
  Administered 2020-12-14: 10 mg via INTRAVENOUS
  Filled 2020-12-13: qty 1

## 2020-12-13 MED ORDER — NEBIVOLOL HCL 5 MG PO TABS
5.0000 mg | ORAL_TABLET | Freq: Every day | ORAL | Status: DC
  Administered 2020-12-14: 5 mg via ORAL
  Filled 2020-12-13: qty 1

## 2020-12-13 MED ORDER — KETOROLAC TROMETHAMINE 30 MG/ML IJ SOLN
INTRAMUSCULAR | Status: AC
  Filled 2020-12-13: qty 1

## 2020-12-13 MED ORDER — ACETAMINOPHEN 325 MG PO TABS: 325.0000 mg | ORAL_TABLET | Freq: Four times a day (QID) | ORAL | Status: DC | PRN

## 2020-12-13 MED ORDER — ORAL CARE MOUTH RINSE: 15.0000 mL | Freq: Once | OROMUCOSAL | Status: AC

## 2020-12-13 MED ORDER — PROPOFOL 10 MG/ML IV BOLUS
INTRAVENOUS | Status: DC | PRN
  Administered 2020-12-13: 30 mg via INTRAVENOUS
  Administered 2020-12-13: 120 mg via INTRAVENOUS

## 2020-12-13 MED ORDER — FENTANYL CITRATE (PF) 100 MCG/2ML IJ SOLN
25.0000 ug | INTRAMUSCULAR | Status: DC | PRN
Start: 2020-12-13 — End: 2020-12-13
  Administered 2020-12-13 (×3): 50 ug via INTRAVENOUS

## 2020-12-13 MED ORDER — BUPIVACAINE-EPINEPHRINE (PF) 0.25% -1:200000 IJ SOLN
INTRAMUSCULAR | Status: DC | PRN
  Administered 2020-12-13: 30 mL

## 2020-12-13 MED ORDER — POLYETHYLENE GLYCOL 3350 17 G PO PACK
17.0000 g | PACK | Freq: Two times a day (BID) | ORAL | Status: DC
  Administered 2020-12-13 – 2020-12-14 (×2): 17 g via ORAL
  Filled 2020-12-13 (×2): qty 1

## 2020-12-13 MED ORDER — METOCLOPRAMIDE HCL 5 MG PO TABS: 5.0000 mg | ORAL_TABLET | Freq: Three times a day (TID) | ORAL | Status: DC | PRN

## 2020-12-13 MED ORDER — PHENYLEPHRINE 40 MCG/ML (10ML) SYRINGE FOR IV PUSH (FOR BLOOD PRESSURE SUPPORT)
PREFILLED_SYRINGE | INTRAVENOUS | Status: DC | PRN
  Administered 2020-12-13 (×3): 120 ug via INTRAVENOUS

## 2020-12-13 MED ORDER — HYDROMORPHONE HCL 1 MG/ML IJ SOLN
0.5000 mg | INTRAMUSCULAR | Status: AC | PRN
  Administered 2020-12-13 (×2): 0.5 mg via INTRAVENOUS

## 2020-12-13 MED ORDER — TRANEXAMIC ACID-NACL 1000-0.7 MG/100ML-% IV SOLN
1000.0000 mg | Freq: Once | INTRAVENOUS | Status: AC
  Administered 2020-12-13: 1000 mg via INTRAVENOUS
  Filled 2020-12-13: qty 100

## 2020-12-13 MED ORDER — CHLORHEXIDINE GLUCONATE 0.12 % MT SOLN
15.0000 mL | Freq: Once | OROMUCOSAL | Status: AC
  Administered 2020-12-13: 15 mL via OROMUCOSAL

## 2020-12-13 MED ORDER — ONDANSETRON HCL 4 MG/2ML IJ SOLN: 4.0000 mg | Freq: Once | INTRAMUSCULAR | Status: DC | PRN

## 2020-12-13 MED ORDER — OXYCODONE HCL 5 MG PO TABS
5.0000 mg | ORAL_TABLET | Freq: Once | ORAL | Status: AC | PRN
Start: 2020-12-13 — End: 2020-12-13
  Administered 2020-12-13: 5 mg via ORAL

## 2020-12-13 MED ORDER — KETOROLAC TROMETHAMINE 30 MG/ML IJ SOLN
INTRAMUSCULAR | Status: DC | PRN
  Administered 2020-12-13: 30 mg

## 2020-12-13 MED ORDER — MENTHOL 3 MG MT LOZG: 1.0000 | LOZENGE | OROMUCOSAL | Status: DC | PRN

## 2020-12-13 MED ORDER — ROCURONIUM BROMIDE 10 MG/ML (PF) SYRINGE
PREFILLED_SYRINGE | INTRAVENOUS | Status: DC | PRN
  Administered 2020-12-13: 50 mg via INTRAVENOUS

## 2020-12-13 MED ORDER — FENTANYL CITRATE (PF) 100 MCG/2ML IJ SOLN
INTRAMUSCULAR | Status: DC | PRN
  Administered 2020-12-13 (×2): 50 ug via INTRAVENOUS

## 2020-12-13 MED ORDER — DEXAMETHASONE SODIUM PHOSPHATE 10 MG/ML IJ SOLN
10.0000 mg | Freq: Once | INTRAMUSCULAR | Status: AC
  Administered 2020-12-13: 8 mg via INTRAVENOUS

## 2020-12-13 MED ORDER — OXYCODONE HCL 5 MG PO TABS
ORAL_TABLET | ORAL | Status: AC
  Filled 2020-12-13: qty 1

## 2020-12-13 MED ORDER — SUGAMMADEX SODIUM 200 MG/2ML IV SOLN
INTRAVENOUS | Status: DC | PRN
  Administered 2020-12-13: 200 mg via INTRAVENOUS

## 2020-12-13 MED ORDER — TRIAMTERENE-HCTZ 37.5-25 MG PO CAPS
1.0000 | ORAL_CAPSULE | Freq: Every day | ORAL | Status: DC
  Administered 2020-12-14: 1 via ORAL
  Filled 2020-12-13 (×2): qty 1

## 2020-12-13 MED ORDER — MIDAZOLAM HCL 2 MG/2ML IJ SOLN
INTRAMUSCULAR | Status: AC
  Filled 2020-12-13: qty 2

## 2020-12-13 MED ORDER — OXYCODONE HCL 5 MG/5ML PO SOLN: 5.0000 mg | Freq: Once | ORAL | Status: AC | PRN

## 2020-12-13 MED ORDER — MIDAZOLAM HCL 2 MG/2ML IJ SOLN
INTRAMUSCULAR | Status: DC | PRN
  Administered 2020-12-13: 2 mg via INTRAVENOUS

## 2020-12-13 MED ORDER — ONDANSETRON HCL 4 MG/2ML IJ SOLN: 4.0000 mg | Freq: Four times a day (QID) | INTRAMUSCULAR | Status: DC | PRN

## 2020-12-13 MED ORDER — RIVAROXABAN 10 MG PO TABS
10.0000 mg | ORAL_TABLET | ORAL | Status: DC
  Administered 2020-12-14: 10 mg via ORAL
  Filled 2020-12-13: qty 1

## 2020-12-13 MED ORDER — ALUM & MAG HYDROXIDE-SIMETH 200-200-20 MG/5ML PO SUSP: 15.0000 mL | ORAL | Status: DC | PRN

## 2020-12-13 MED ORDER — PROPOFOL 1000 MG/100ML IV EMUL
INTRAVENOUS | Status: AC
  Filled 2020-12-13: qty 100

## 2020-12-13 MED ORDER — ROCURONIUM BROMIDE 10 MG/ML (PF) SYRINGE
PREFILLED_SYRINGE | INTRAVENOUS | Status: AC
  Filled 2020-12-13: qty 10

## 2020-12-13 MED ORDER — PHENOL 1.4 % MT LIQD: 1.0000 | OROMUCOSAL | Status: DC | PRN

## 2020-12-13 SURGICAL SUPPLY — 55 items
ATTUNE MED ANAT PAT 32 KNEE (Knees) ×2 IMPLANT
ATTUNE PSFEM RTSZ4 NARCEM KNEE (Femur) ×2 IMPLANT
ATTUNE PSRP INSR SZ4 7 KNEE (Insert) ×2 IMPLANT
BAG SPEC THK2 15X12 ZIP CLS (MISCELLANEOUS)
BAG ZIPLOCK 12X15 (MISCELLANEOUS) IMPLANT
BASE TIBIAL ROT PLAT SZ 3 KNEE (Knees) ×1 IMPLANT
BLADE SAW SGTL 11.0X1.19X90.0M (BLADE) ×2 IMPLANT
BLADE SAW SGTL 13.0X1.19X90.0M (BLADE) ×2 IMPLANT
BLADE SURG SZ10 CARB STEEL (BLADE) ×4 IMPLANT
BNDG ELASTIC 6X5.8 VLCR STR LF (GAUZE/BANDAGES/DRESSINGS) ×2 IMPLANT
BOWL SMART MIX CTS (DISPOSABLE) ×2 IMPLANT
BSPLAT TIB 3 CMNT ROT PLAT STR (Knees) ×1 IMPLANT
CEMENT HV SMART SET (Cement) ×4 IMPLANT
COVER WAND RF STERILE (DRAPES) IMPLANT
CUFF TOURN SGL QUICK 34 (TOURNIQUET CUFF) ×2
CUFF TRNQT CYL 34X4.125X (TOURNIQUET CUFF) ×1 IMPLANT
DECANTER SPIKE VIAL GLASS SM (MISCELLANEOUS) ×4 IMPLANT
DERMABOND ADVANCED (GAUZE/BANDAGES/DRESSINGS) ×1
DERMABOND ADVANCED .7 DNX12 (GAUZE/BANDAGES/DRESSINGS) ×1 IMPLANT
DRAPE U-SHAPE 47X51 STRL (DRAPES) ×2 IMPLANT
DRESSING AQUACEL AG SP 3.5X10 (GAUZE/BANDAGES/DRESSINGS) ×1 IMPLANT
DRSG AQUACEL AG SP 3.5X10 (GAUZE/BANDAGES/DRESSINGS) ×2
DURAPREP 26ML APPLICATOR (WOUND CARE) ×4 IMPLANT
ELECT REM PT RETURN 15FT ADLT (MISCELLANEOUS) ×2 IMPLANT
GLOVE ECLIPSE 8.0 STRL XLNG CF (GLOVE) IMPLANT
GLOVE ORTHO TXT STRL SZ7.5 (GLOVE) ×2 IMPLANT
GLOVE SURG ENC MOIS LTX SZ6 (GLOVE) IMPLANT
GLOVE SURG UNDER POLY LF SZ6.5 (GLOVE) IMPLANT
GLOVE SURG UNDER POLY LF SZ7.5 (GLOVE) ×2 IMPLANT
GLOVE SURG UNDER POLY LF SZ8.5 (GLOVE) IMPLANT
GOWN STRL REUS W/TWL 2XL LVL3 (GOWN DISPOSABLE) IMPLANT
GOWN STRL REUS W/TWL LRG LVL3 (GOWN DISPOSABLE) ×2 IMPLANT
HANDPIECE INTERPULSE COAX TIP (DISPOSABLE) ×2
HOLDER FOLEY CATH W/STRAP (MISCELLANEOUS) IMPLANT
KIT TURNOVER KIT A (KITS) ×2 IMPLANT
MANIFOLD NEPTUNE II (INSTRUMENTS) ×2 IMPLANT
NDL SAFETY ECLIPSE 18X1.5 (NEEDLE) IMPLANT
NEEDLE HYPO 18GX1.5 SHARP (NEEDLE)
NS IRRIG 1000ML POUR BTL (IV SOLUTION) ×2 IMPLANT
PACK TOTAL KNEE CUSTOM (KITS) ×2 IMPLANT
PENCIL SMOKE EVACUATOR (MISCELLANEOUS) IMPLANT
PIN DRILL FIX HALF THREAD (BIT) ×2 IMPLANT
PIN FIX SIGMA LCS THRD HI (PIN) ×2 IMPLANT
PROTECTOR NERVE ULNAR (MISCELLANEOUS) IMPLANT
SET HNDPC FAN SPRY TIP SCT (DISPOSABLE) ×1 IMPLANT
SET PAD KNEE POSITIONER (MISCELLANEOUS) ×2 IMPLANT
SUT MNCRL AB 4-0 PS2 18 (SUTURE) ×2 IMPLANT
SUT STRATAFIX PDS+ 0 24IN (SUTURE) ×2 IMPLANT
SUT VIC AB 1 CT1 36 (SUTURE) ×2 IMPLANT
SUT VIC AB 2-0 CT1 27 (SUTURE) ×6
SUT VIC AB 2-0 CT1 TAPERPNT 27 (SUTURE) ×3 IMPLANT
SYR 3ML LL SCALE MARK (SYRINGE) ×2 IMPLANT
TIBIAL BASE ROT PLAT SZ 3 KNEE (Knees) ×2 IMPLANT
WATER STERILE IRR 1000ML POUR (IV SOLUTION) ×4 IMPLANT
WRAP KNEE MAXI GEL POST OP (GAUZE/BANDAGES/DRESSINGS) ×2 IMPLANT

## 2020-12-13 NOTE — Care Plan (Signed)
Ortho Bundle Case Management Note  Patient Details  Name: [name] MRN: [id number] Date of Birth: 01-06-1951  R TKA on 12-13-20 DCP:  Home with husband and dtr.  2 story home with 3 ste. DME:  RW ordered through [facility].  Has elevated toilets.   PT: [facility].  PT eval scheduled on 12-16-20.                   DME Arranged:  Gilford Rile rolling DME Agency:  [employer]  HH Arranged:  NA [place] Agency:  NA  Additional Comments: Please contact me with any questions of if this plan should need to change.  Marianne Sofia, RN,CCM [facility]  [phone] 12/13/2020, 2:57 PM

## 2020-12-13 NOTE — Interval H&P Note (Signed)
History and Physical Interval Note:  12/13/2020 8:26 AM  [name]  has presented today for surgery, with the diagnosis of Right knee osteoarthritis.  The various methods of treatment have been discussed with the patient and family. After consideration of risks, benefits and other options for treatment, the patient has consented to  Procedure(s) with comments: TOTAL KNEE ARTHROPLASTY (Right) - 70 mins as a surgical intervention.  The patient's history has been reviewed, patient examined, no change in status, stable for surgery.  I have reviewed the patient's chart and labs.  Questions were answered to the patient's satisfaction.     Mauri Pole

## 2020-12-13 NOTE — Op Note (Signed)
NAME:  [name]                      MEDICAL RECORD NO.:  [id number]                             FACILITY:  [facility]      PHYSICIAN:  Pietro Cassis. Alvan Dame, M.D.  DATE OF BIRTH:  11/14/1950      DATE OF PROCEDURE:  12/13/2020                                     OPERATIVE REPORT         PREOPERATIVE DIAGNOSIS:  Right knee osteoarthritis.      POSTOPERATIVE DIAGNOSIS:  Right knee osteoarthritis.      FINDINGS:  The patient was noted to have complete loss of cartilage and   bone-on-bone arthritis with associated osteophytes in the lateral and patellofemoral compartments of   the knee with a significant synovitis and associated effusion.  The patient had failed months of conservative treatment including medications, injection therapy, activity modification.     PROCEDURE:  Right total knee replacement.      COMPONENTS USED:  DePuy Attune rotating platform posterior stabilized knee   system, a size 4N femur, 3 tibia, size 7 mm PS AOX insert, and 32 anatomic patellar   button.      SURGEON:  Pietro Cassis. Alvan Dame, M.D.      ASSISTANT:  Griffith Citron, PA-C.      ANESTHESIA:  General and Regional.      SPECIMENS:  None.      COMPLICATION:  None.      DRAINS:  None.  EBL: <100 cc      TOURNIQUET TIME:   Total Tourniquet Time Documented: Thigh (Right) - 29 minutes Total: Thigh (Right) - 29 minutes  .      The patient was stable to the recovery room.      INDICATION FOR PROCEDURE:  [name] is a 70 y.o. female patient of   mine.  The patient had been seen, evaluated, and treated for months conservatively in the   office with medication, activity modification, and injections.  The patient had   radiographic changes of bone-on-bone arthritis with endplate sclerosis and osteophytes noted.  Based on the radiographic changes and failed conservative measures, the patient   decided to proceed with definitive treatment, total knee replacement.  Risks of infection, DVT, component  failure, need for revision surgery, neurovascular injury were reviewed in the office setting.  The postop course was reviewed stressing the efforts to maximize post-operative satisfaction and function.  Consent was obtained for benefit of pain   relief.      PROCEDURE IN DETAIL:  The patient was brought to the operative theater.   Once adequate anesthesia, preoperative antibiotics, 2 gm of Ancef,1 gm of Tranexamic Acid, and 10 mg of Decadron administered, the patient was positioned supine with a right thigh tourniquet placed.  The  right lower extremity was prepped and draped in sterile fashion.  A time-   out was performed identifying the patient, planned procedure, and the appropriate extremity.      The right lower extremity was placed in the [facility] leg holder.  The leg was   exsanguinated, tourniquet elevated to 250 mmHg.  A midline incision was  made followed by median parapatellar arthrotomy.  Following initial   exposure, attention was first directed to the patella.  Precut   measurement was noted to be 25 mm.  I resected down to 14 mm and used a   32 anatomic patellar button to restore patellar height as well as cover the cut surface.      The lug holes were drilled and a metal shim was placed to protect the   patella from retractors and saw blade during the procedure.      At this point, attention was now directed to the femur.  The femoral   canal was opened with a drill, irrigated to try to prevent fat emboli.  An   intramedullary rod was passed at 5 degrees valgus, 8 mm of bone was   resected off the distal femur due to pre-operative hyperextension.  Following this resection, the tibia was   subluxated anteriorly.  Using the extramedullary guide, 2 mm of bone was resected off   the proximal medial tibia.  We confirmed the gap would be   stable medially and laterally with a size 5 spacer block as well as confirmed that the tibial cut was perpendicular in the coronal plane,  checking with an alignment rod.      Once this was done, I sized the femur to be a size 4 in the anterior-   posterior dimension, chose a narrow component based on medial and   lateral dimension.  The size 4 rotation block was then pinned in   position anterior referenced using the C-clamp to set rotation.  The   anterior, posterior, and  chamfer cuts were made without difficulty nor   notching making certain that I was along the anterior cortex to help   with flexion gap stability.      The final box cut was made off the lateral aspect of distal femur.      At this point, the tibia was sized to be a size 3.  The size 3 tray was   then pinned in position through the medial third of the tubercle,   drilled, and keel punched.  Trial reduction was now carried with a 4 femur,  3 tibia, a size 7 mm PS insert, and the 32 anatomic patella botton.  The knee was brought to full extension with good flexion stability with the patella   tracking through the trochlea without application of pressure.  Given   all these findings the trial components removed.  Final components were   opened and cement was mixed.  The knee was irrigated with normal saline solution and pulse lavage.  The synovial lining was   then injected with 30 cc of 0.25% Marcaine with epinephrine, 1 cc of Toradol and 30 cc of NS for a total of 61 cc.     Final implants were then cemented onto cleaned and dried cut surfaces of bone with the knee brought to extension with a size 7 mm PS trial insert.      Once the cement had fully cured, excess cement was removed   throughout the knee.  I confirmed that I was satisfied with the range of   motion and stability, and the final size 7 mm PS AOX insert was chosen.  It was   placed into the knee.      The tourniquet had been let down at 29 minutes.  No significant   hemostasis was required.  The extensor mechanism was then  reapproximated using #1 Vicryl and #1 Stratafix sutures with the knee    in flexion.  The   remaining wound was closed with 2-0 Vicryl and running 4-0 Monocryl.   The knee was cleaned, dried, dressed sterilely using Dermabond and   Aquacel dressing.  The patient was then   brought to recovery room in stable condition, tolerating the procedure   well.   Please note that Physician Assistant, Griffith Citron, PA-C was present for the entirety of the case, and was utilized for pre-operative positioning, peri-operative retractor management, general facilitation of the procedure and for primary wound closure at the end of the case.              Pietro Cassis Alvan Dame, M.D.    12/13/2020 8:27 AM

## 2020-12-13 NOTE — Discharge Instructions (Signed)
INSTRUCTIONS AFTER JOINT REPLACEMENT  ° °o Remove items at home which could result in a fall. This includes throw rugs or furniture in walking pathways °o ICE to the affected joint every three hours while awake for 30 minutes at a time, for at least the first 3-5 days, and then as needed for pain and swelling.  Continue to use ice for pain and swelling. You may notice swelling that will progress down to the foot and ankle.  This is normal after surgery.  Elevate your leg when you are not up walking on it.   °o Continue to use the breathing machine you got in the hospital (incentive spirometer) which will help keep your temperature down.  It is common for your temperature to cycle up and down following surgery, especially at night when you are not up moving around and exerting yourself.  The breathing machine keeps your lungs expanded and your temperature down. ° ° °DIET:  As you were doing prior to hospitalization, we recommend a well-balanced diet. ° °DRESSING / WOUND CARE / SHOWERING ° °Keep the surgical dressing until follow up.  The dressing is water proof, so you can shower without any extra covering.  IF THE DRESSING FALLS OFF or the wound gets wet inside, change the dressing with sterile gauze.  Please use good hand washing techniques before changing the dressing.  Do not use any lotions or creams on the incision until instructed by your surgeon.   ° °ACTIVITY ° °o Increase activity slowly as tolerated, but follow the weight bearing instructions below.   °o No driving for 6 weeks or until further direction given by your physician.  You cannot drive while taking narcotics.  °o No lifting or carrying greater than 10 lbs. until further directed by your surgeon. °o Avoid periods of inactivity such as sitting longer than an hour when not asleep. This helps prevent blood clots.  °o You may return to work once you are authorized by your doctor.  ° ° ° °WEIGHT BEARING  ° °Weight bearing as tolerated with assist  device (walker, cane, etc) as directed, use it as long as suggested by your surgeon or therapist, typically at least 4-6 weeks. ° ° °EXERCISES ° °Results after joint replacement surgery are often greatly improved when you follow the exercise, range of motion and muscle strengthening exercises prescribed by your doctor. Safety measures are also important to protect the joint from further injury. Any time any of these exercises cause you to have increased pain or swelling, decrease what you are doing until you are comfortable again and then slowly increase them. If you have problems or questions, call your caregiver or physical therapist for advice.  ° °Rehabilitation is important following a joint replacement. After just a few days of immobilization, the muscles of the leg can become weakened and shrink (atrophy).  These exercises are designed to build up the tone and strength of the thigh and leg muscles and to improve motion. Often times heat used for twenty to thirty minutes before working out will loosen up your tissues and help with improving the range of motion but do not use heat for the first two weeks following surgery (sometimes heat can increase post-operative swelling).  ° °These exercises can be done on a training (exercise) mat, on the floor, on a table or on a bed. Use whatever works the best and is most comfortable for you.    Use music or television while you are exercising so that   the exercises are a pleasant break in your day. This will make your life better with the exercises acting as a break in your routine that you can look forward to.   Perform all exercises about fifteen times, three times per day or as directed.  You should exercise both the operative leg and the other leg as well. ° °Exercises include: °  °• Quad Sets - Tighten up the muscle on the front of the thigh (Quad) and hold for 5-10 seconds.   °• Straight Leg Raises - With your knee straight (if you were given a brace, keep it on),  lift the leg to 60 degrees, hold for 3 seconds, and slowly lower the leg.  Perform this exercise against resistance later as your leg gets stronger.  °• Leg Slides: Lying on your back, slowly slide your foot toward your buttocks, bending your knee up off the floor (only go as far as is comfortable). Then slowly slide your foot back down until your leg is flat on the floor again.  °• Angel Wings: Lying on your back spread your legs to the side as far apart as you can without causing discomfort.  °• Hamstring Strength:  Lying on your back, push your heel against the floor with your leg straight by tightening up the muscles of your buttocks.  Repeat, but this time bend your knee to a comfortable angle, and push your heel against the floor.  You may put a pillow under the heel to make it more comfortable if necessary.  ° °A rehabilitation program following joint replacement surgery can speed recovery and prevent re-injury in the future due to weakened muscles. Contact your doctor or a physical therapist for more information on knee rehabilitation.  ° ° °CONSTIPATION ° °Constipation is defined medically as fewer than three stools per week and severe constipation as less than one stool per week.  Even if you have a regular bowel pattern at home, your normal regimen is likely to be disrupted due to multiple reasons following surgery.  Combination of anesthesia, postoperative narcotics, change in appetite and fluid intake all can affect your bowels.  ° °YOU MUST use at least one of the following options; they are listed in order of increasing strength to get the job done.  They are all available over the counter, and you may need to use some, POSSIBLY even all of these options:   ° °Drink plenty of fluids (prune juice may be helpful) and high fiber foods °Colace 100 mg by mouth twice a day  °Senokot for constipation as directed and as needed Dulcolax (bisacodyl), take with full glass of water  °Miralax (polyethylene glycol)  once or twice a day as needed. ° °If you have tried all these things and are unable to have a bowel movement in the first 3-4 days after surgery call either your surgeon or your primary doctor.   ° °If you experience loose stools or diarrhea, hold the medications until you stool forms back up.  If your symptoms do not get better within 1 week or if they get worse, check with your doctor.  If you experience "the worst abdominal pain ever" or develop nausea or vomiting, please contact the office immediately for further recommendations for treatment. ° ° °ITCHING:  If you experience itching with your medications, try taking only a single pain pill, or even half a pain pill at a time.  You can also use Benadryl over the counter for itching or also to   help with sleep.   TED HOSE STOCKINGS:  Use stockings on both legs until for at least 2 weeks or as directed by physician office. They may be removed at night for sleeping.  MEDICATIONS:  See your medication summary on the After Visit Summary that nursing will review with you.  You may have some home medications which will be placed on hold until you complete the course of blood thinner medication.  It is important for you to complete the blood thinner medication as prescribed.  PRECAUTIONS:  If you experience chest pain or shortness of breath - call 911 immediately for transfer to the hospital emergency department.   If you develop a fever greater that 101 F, purulent drainage from wound, increased redness or drainage from wound, foul odor from the wound/dressing, or calf pain - CONTACT YOUR SURGEON.                                                   FOLLOW-UP APPOINTMENTS:  If you do not already have a post-op appointment, please call the office for an appointment to be seen by your surgeon.  Guidelines for how soon to be seen are listed in your After Visit Summary, but are typically between 1-4 weeks after surgery.  OTHER INSTRUCTIONS:   Knee  Replacement:  Do not place pillow under knee, focus on keeping the knee straight while resting. CPM instructions: 0-90 degrees, 2 hours in the morning, 2 hours in the afternoon, and 2 hours in the evening. Place foam block, curve side up under heel at all times except when in CPM or when walking.  DO NOT modify, tear, cut, or change the foam block in any way.   DENTAL ANTIBIOTICS:  In most cases prophylactic antibiotics for Dental procdeures after total joint surgery are not necessary.  Exceptions are as follows:  1. History of prior total joint infection  2. Severely immunocompromised (Organ Transplant, cancer chemotherapy, Rheumatoid biologic meds such as [place])  3. Poorly controlled diabetes (A1C &gt; 8.0, blood glucose over 200)  If you have one of these conditions, contact your surgeon for an antibiotic prescription, prior to your dental procedure.   MAKE SURE YOU:   Understand these instructions.   Get help right away if you are not doing well or get worse.    Thank you for letting us be a part of your medical care team.  It is a privilege we respect greatly.  We hope these instructions will help you stay on track for a fast and full recovery!     Information on my medicine - XARELTO (Rivaroxaban)    Why was Xarelto prescribed for you? Xarelto was prescribed for you to reduce the risk of blood clots forming after orthopedic surgery. The medical term for these abnormal blood clots is venous thromboembolism (VTE).  What do you need to know about xarelto ? Take your Xarelto ONCE DAILY at the same time every day. You may take it either with or without food.  If you have difficulty swallowing the tablet whole, you may crush it and mix in applesauce just prior to taking your dose.  Take Xarelto exactly as prescribed by your doctor and DO NOT stop taking Xarelto without talking to the doctor who prescribed the medication.  Stopping without other VTE prevention  medication to take the place of  Xarelto may increase your risk of developing a clot.  After discharge, you should have regular check-up appointments with your healthcare provider that is prescribing your Xarelto.    What do you do if you miss a dose? If you miss a dose, take it as soon as you remember on the same day then continue your regularly scheduled once daily regimen the next day. Do not take two doses of Xarelto on the same day.   Important Safety Information A possible side effect of Xarelto is bleeding. You should call your healthcare provider right away if you experience any of the following: ? Bleeding from an injury or your nose that does not stop. ? Unusual colored urine (red or dark brown) or unusual colored stools (red or black). ? Unusual bruising for unknown reasons. ? A serious fall or if you hit your head (even if there is no bleeding).  Some medicines may interact with Xarelto and might increase your risk of bleeding while on Xarelto. To help avoid this, consult your healthcare provider or pharmacist prior to using any new prescription or non-prescription medications, including herbals, vitamins, non-steroidal anti-inflammatory drugs (NSAIDs) and supplements.  This website has more information on Xarelto: [email or web address]

## 2020-12-13 NOTE — Anesthesia Procedure Notes (Signed)
Procedure Name: Intubation Date/Time: 12/13/2020 7:16 AM Performed by: Niel Hummer, CRNA Pre-anesthesia Checklist: Patient identified, Emergency Drugs available, Suction available and Patient being monitored Patient Re-evaluated:Patient Re-evaluated prior to induction Oxygen Delivery Method: Circle system utilized Preoxygenation: Pre-oxygenation with 100% oxygen Induction Type: IV induction Ventilation: Mask ventilation without difficulty Laryngoscope Size: Mac and 4 Grade View: Grade I Tube type: Oral Number of attempts: 1 Airway Equipment and Method: Stylet Placement Confirmation: ETT inserted through vocal cords under direct vision,  positive ETCO2 and breath sounds checked- equal and bilateral Secured at: 22 cm Tube secured with: Tape Dental Injury: Teeth and Oropharynx as per pre-operative assessment

## 2020-12-13 NOTE — Anesthesia Procedure Notes (Signed)
Anesthesia Regional Block: Adductor canal block   Pre-Anesthetic Checklist: ,, timeout performed, Correct Patient, Correct Site, Correct Laterality, Correct Procedure, Correct Position, site marked, Risks and benefits discussed,  Surgical consent,  Pre-op evaluation,  At surgeon's request and post-op pain management  Laterality: Right  Prep: chloraprep       Needles:  Injection technique: Single-shot  Needle Type: Echogenic Stimulator Needle     Needle Length: 9cm  Needle Gauge: 21   Needle insertion depth: 7 cm   Additional Needles:   Procedures:,,,, ultrasound used (permanent image in chart),,,,  Narrative:  Start time: 12/13/2020 7:00 AM End time: 12/13/2020 7:05 AM Injection made incrementally with aspirations every 5 mL.  Performed by: Personally  Anesthesiologist: Josephine Igo, MD  Additional Notes: Timeout performed. Patient sedated. Relevant anatomy ID'd using [place]. Incremental 2-76ml injection of LA with frequent aspiration. Patient tolerated procedure well.        Right Adductor Canal Block

## 2020-12-13 NOTE — Anesthesia Postprocedure Evaluation (Signed)
Anesthesia Post Note  Patient: [name]  Procedure(s) Performed: TOTAL KNEE ARTHROPLASTY (Right Knee)     Patient location during evaluation: PACU Anesthesia Type: General Level of consciousness: awake and alert and oriented Pain management: pain level controlled Vital Signs Assessment: post-procedure vital signs reviewed and stable Respiratory status: spontaneous breathing, nonlabored ventilation and respiratory function stable Cardiovascular status: blood pressure returned to baseline and stable Postop Assessment: no apparent nausea or vomiting Anesthetic complications: no   No complications documented.  Last Vitals:  Vitals:   12/13/20 0930 12/13/20 0945  BP: 130/79 130/84  Pulse: 79 77  Resp: 19 12  Temp:    SpO2: 95% 95%    Last Pain:  Vitals:   12/13/20 0945  TempSrc:   PainSc: Asleep                 Shaana Acocella A.

## 2020-12-13 NOTE — Transfer of Care (Signed)
Immediate Anesthesia Transfer of Care Note  Patient: [name]  Procedure(s) Performed: TOTAL KNEE ARTHROPLASTY (Right Knee)  Patient Location: PACU  Anesthesia Type:General  Level of Consciousness: awake, alert  and oriented  Airway & Oxygen Therapy: Patient Spontanous Breathing and Patient connected to face mask oxygen  Post-op Assessment: Report given to RN, Post -op Vital signs reviewed and stable and Patient moving all extremities X 4  Post vital signs: Reviewed and stable  Last Vitals:  Vitals Value Taken Time  BP 133/71 12/13/20 0855  Temp    Pulse 93 12/13/20 0856  Resp 21 12/13/20 0856  SpO2 100 % 12/13/20 0856  Vitals shown include unvalidated device data.  Last Pain:  Vitals:   12/13/20 0637  TempSrc: Oral  PainSc:       Patients Stated Pain Goal: 5 (73/66/81 5947)  Complications: No complications documented.

## 2020-12-13 NOTE — Evaluation (Signed)
Physical Therapy Evaluation Patient Details Name: [name] MRN: [id number] DOB: 09-13-1951 Today's Date: 12/13/2020   History of Present Illness  Patient is a 70 y.o. 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    Clinical Impression  [name] is a 70 y.o. female POD 0 s/p Rt TKA. Patient reports independence with mobility at baseline. Patient is now limited by functional impairments (see PT problem list below) and requires min-mod assist for transfers and gait with RW. Patient was able to take several small side steps with mod assist and use of Rt knee immobilizer to move bed<>BSC. Pt was limited to transfers only due to buckling on Rt knee even with immobilizer in place. Patient declined to sit up in recliner at EOS and returned to bed. Patient instructed in exercise to facilitate circulation to reduce risk of DVT. Patient will benefit from continued skilled PT interventions to address impairments and progress towards PLOF. Acute PT will follow to progress mobility and stair training in preparation for safe discharge home.     Follow Up Recommendations Follow surgeon's recommendation for DC plan and follow-up therapies;Home health PT    Equipment Recommendations  3in1 (PT)    Recommendations for Other Services       Precautions / Restrictions Precautions Precautions: Fall Restrictions Weight Bearing Restrictions: No Other Position/Activity Restrictions: WBAT      Mobility  Bed Mobility Overal bed mobility: Needs Assistance Bed Mobility: Supine to Sit;Sit to Supine     Supine to sit: Min assist;HOB elevated Sit to supine: Min assist   General bed mobility comments: cues for use of bed rail, assist to bring Rt LE in immobilizer on/off EOB.     Transfers Overall transfer level: Needs assistance Equipment used: Rolling walker (2 wheeled) Transfers: Sit to/from [facility] Sit to Stand: Mod assist Stand pivot transfers: Mod assist;+2 safety/equipment       General transfer comment: Assist for power up from EOB and BSC. cues for hand placement and step pattern with RW for stand step transfers bed<>BSC. pt required manual blocking/facilitation of knee extension on Rt from therapist along with use of immobilizer to prevent buckling.  Ambulation/Gait                Stairs            Wheelchair Mobility    Modified Rankin (Stroke Patients Only)       Balance Overall balance assessment: Needs assistance Sitting-balance support: Feet supported Sitting balance-Leahy Scale: Good     Standing balance support: During functional activity;Bilateral upper extremity supported Standing balance-Leahy Scale: Poor Standing balance comment: reliant on RW and external support from therapist                             Pertinent Vitals/Pain Pain Assessment: 0-10 Pain Score: 7  Pain Location: Rt knee Pain Descriptors / Indicators: Aching;Discomfort Pain Intervention(s): Limited activity within patient's tolerance;Monitored during session;Repositioned    Home Living Family/patient expects to be discharged to:: Private residence Living Arrangements: Spouse/significant other Available Help at Discharge: Family Type of Home: House Home Access: Stairs to enter Entrance Stairs-Rails: Left Entrance Stairs-Number of Steps: 4 Home Layout: Two level;Bed/bath upstairs Home Equipment: Walker - 2 wheels Additional Comments: daughter available to assist  Prior Function Level of Independence: Independent               Hand Dominance   Dominant Hand: Right    Extremity/Trunk Assessment   Upper Extremity Assessment Upper Extremity Assessment: Overall WFL for tasks assessed    Lower  Extremity Assessment Lower Extremity Assessment: RLE deficits/detail RLE Deficits / Details: pt with poor quad activation (1/5 for strength), unable to complete SLR without significant extensor lag. RLE Sensation: WNL RLE Coordination: WNL    Cervical / Trunk Assessment Cervical / Trunk Assessment: Normal  Communication   Communication: No difficulties  Cognition Arousal/Alertness: Awake/alert Behavior During Therapy: WFL for tasks assessed/performed Overall Cognitive Status: Within Functional Limits for tasks assessed                                        General Comments      Exercises Total Joint Exercises Ankle Circles/Pumps: AROM;Both;20 reps;Supine   Assessment/Plan    PT Assessment Patient needs continued PT services  PT Problem List Decreased strength;Decreased range of motion;Decreased activity tolerance;Decreased balance;Decreased mobility;Decreased knowledge of use of DME;Decreased knowledge of precautions;Pain       PT Treatment Interventions Gait training;DME instruction;Stair training;Functional mobility training;Therapeutic activities;Therapeutic exercise;Balance training;Patient/family education    PT Goals (Current goals can be found in the Care Plan section)  Acute Rehab PT Goals Patient Stated Goal: get back to PT Goal Formulation: With patient Time For Goal Achievement: 12/20/20 Potential to Achieve Goals: Good    Frequency 7X/week   Barriers to discharge        Co-evaluation               AM-PAC PT "6 Clicks" Mobility  Outcome Measure Help needed turning from your back to your side while in a flat bed without using bedrails?: A Little Help needed moving from lying on your back to sitting on the side of a flat bed without using bedrails?: A Little Help needed moving to and from a bed to a chair (including a wheelchair)?: A Lot Help needed standing up from a chair using your arms (e.g., wheelchair or bedside chair)?: A  Lot Help needed to walk in hospital room?: A Lot Help needed climbing 3-5 steps with a railing? : A Lot 6 Click Score: 14    End of Session Equipment Utilized During Treatment: Gait belt;Right knee immobilizer Activity Tolerance: Patient tolerated treatment well Patient left: in bed;with call bell/phone within reach;with family/visitor present Nurse Communication: Mobility status;Other (comment) (immobilixer and 2+ assist for bed<>BSC) PT Visit Diagnosis: Muscle weakness (generalized) (M62.81);Difficulty in walking, not elsewhere classified (R26.2)    Time: 0998-3382 PT Time Calculation (min) (ACUTE ONLY): 33 min   Charges:   PT Evaluation $PT Eval Low Complexity: 1 Low PT Treatments $Therapeutic Activity: 8-22 mins        Verner Mould, DPT Acute Rehabilitation Services Office [phone] Pager [phone]    Jacques Navy 12/13/2020, 3:19 PM

## 2020-12-14 DIAGNOSIS — E669 Obesity, unspecified: Secondary | ICD-10-CM | POA: Diagnosis present

## 2020-12-14 DIAGNOSIS — M1711 Unilateral primary osteoarthritis, right knee: Secondary | ICD-10-CM | POA: Diagnosis not present

## 2020-12-14 DIAGNOSIS — Z79899 Other long term (current) drug therapy: Secondary | ICD-10-CM | POA: Diagnosis not present

## 2020-12-14 DIAGNOSIS — Z6832 Body mass index (BMI) 32.0-32.9, adult: Secondary | ICD-10-CM | POA: Diagnosis not present

## 2020-12-14 DIAGNOSIS — I10 Essential (primary) hypertension: Secondary | ICD-10-CM | POA: Diagnosis not present

## 2020-12-14 DIAGNOSIS — R7303 Prediabetes: Secondary | ICD-10-CM | POA: Diagnosis not present

## 2020-12-14 LAB — CBC
HCT: 36.6 % (ref 36.0–46.0)
Hemoglobin: 11.2 g/dL — ABNORMAL LOW (ref 12.0–15.0)
MCH: 27.4 pg (ref 26.0–34.0)
MCHC: 30.6 g/dL (ref 30.0–36.0)
MCV: 89.5 fL (ref 80.0–100.0)
Platelets: 179 10*3/uL (ref 150–400)
RBC: 4.09 MIL/uL (ref 3.87–5.11)
RDW: 15.4 % (ref 11.5–15.5)
WBC: 10.4 10*3/uL (ref 4.0–10.5)
nRBC: 0 % (ref 0.0–0.2)

## 2020-12-14 LAB — BASIC METABOLIC PANEL
Anion gap: 10 (ref 5–15)
BUN: 18 mg/dL (ref 8–23)
CO2: 23 mmol/L (ref 22–32)
Calcium: 8.5 mg/dL — ABNORMAL LOW (ref 8.9–10.3)
Chloride: 108 mmol/L (ref 98–111)
Creatinine, Ser: 0.88 mg/dL (ref 0.44–1.00)
GFR, Estimated: >60 mL/min (ref >60)
Glucose, Bld: 137 mg/dL — ABNORMAL HIGH (ref 70–99)
Potassium: 3.8 mmol/L (ref 3.5–5.1)
Sodium: 141 mmol/L (ref 135–145)

## 2020-12-14 MED ORDER — FERROUS SULFATE 325 (65 FE) MG PO TABS: 325.0000 mg | ORAL_TABLET | Freq: Three times a day (TID) | ORAL | 0 refills | Status: DC

## 2020-12-14 MED ORDER — DOCUSATE SODIUM 100 MG PO CAPS: 100.0000 mg | ORAL_CAPSULE | Freq: Two times a day (BID) | ORAL | 0 refills | Status: DC

## 2020-12-14 MED ORDER — HYDROCODONE-ACETAMINOPHEN 7.5-325 MG PO TABS
1.0000 | ORAL_TABLET | ORAL | 0 refills | Status: DC | PRN
Start: 2020-12-14 — End: 2021-02-22

## 2020-12-14 MED ORDER — POLYETHYLENE GLYCOL 3350 17 G PO PACK: 17.0000 g | PACK | Freq: Two times a day (BID) | ORAL | 0 refills | Status: DC

## 2020-12-14 MED ORDER — METHOCARBAMOL 500 MG PO TABS: 500.0000 mg | ORAL_TABLET | Freq: Four times a day (QID) | ORAL | 0 refills | Status: DC | PRN

## 2020-12-14 MED ORDER — RIVAROXABAN 10 MG PO TABS: 10.0000 mg | ORAL_TABLET | Freq: Every day | ORAL | 0 refills | Status: DC

## 2020-12-14 NOTE — Progress Notes (Signed)
     Subjective: 1 Day Post-Op Procedure(s) (LRB): TOTAL KNEE ARTHROPLASTY (Right)   Seen with Dr. Alvan Dame. Patient reports pain as mild, pain controlled.  No reported events throughout the night.  Dr. Alvan Dame discussed the procedure, findings and expectations moving forward.  Ready to be discharged home, if they do well with PT.  Follow up in the clinic in 2 weeks.  Knows to call with any questions or concerns.     Patient's anticipated LOS is less than 2 midnights, meeting these requirements:  - Lives within 1 hour of care - Has a competent adult at home to recover with post-op recover - NO history of  - Chronic pain requiring opiods  - Diabetes  - Coronary Artery Disease  - Heart failure  - Heart attack  - Stroke  - Cardiac arrhythmia  - Respiratory Failure/COPD  - Renal failure  - Anemia  - Advanced Liver disease       Objective:   VITALS:   Vitals:   12/14/20 0158 12/14/20 0635  BP: 105/61 102/66  Pulse: 63 69  Resp: 16 15  Temp: 98.8 F (37.1 C) 98.1 F (36.7 C)  SpO2: 98% 100%    Dorsiflexion/Plantar flexion intact Incision: dressing C/D/I No cellulitis present Compartment soft  LABS Recent Labs    12/13/20 1150 12/14/20 0338  HGB 13.6 11.2*  HCT 44.2 36.6  WBC 6.9 10.4  PLT 161 179    Recent Labs    12/14/20 0338  NA 141  K 3.8  BUN 18  CREATININE 0.88  GLUCOSE 137*     Assessment/Plan: 1 Day Post-Op Procedure(s) (LRB): TOTAL KNEE ARTHROPLASTY (Right) Foley cath d/c'ed Advance diet Up with therapy D/C IV fluids Discharge home  Follow up in 2 weeks at [facility] Follow up with OLIN,Albion Weatherholtz D in 2 weeks.  Contact information:  [facility] [address] [place] [id number]    Obese (BMI 30-39.9) Estimated body mass index is 32.34 kg/m as calculated from the following:   Height as of this encounter: 5' (1.524 m).   Weight as of this encounter: 75.1 kg. Patient also counseled that  weight may inhibit the healing process Patient counseled that losing weight will help with future health issues       Danae Orleans PA-C  [facility]  [place] [address], [place], [place] [address] Phone: [phone] [email or web address] Facebook  [employer]

## 2020-12-14 NOTE — Progress Notes (Signed)
Physical Therapy Treatment Patient Details Name: [name] MRN: [id number] DOB: 23-Nov-1950 Today's Date: 12/14/2020    History of Present Illness Patient is a 70 y.o. 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    PT Comments    POD # 1 am session Assisted OOB to amb to bathroom then in hallway.  Practiced stairs then assisted back to bed per pt request to nap.   Follow Up Recommendations  Follow surgeon's recommendation for DC plan and follow-up therapies;Home health PT     Equipment Recommendations  3in1 (PT)    Recommendations for Other Services       Precautions / Restrictions Precautions Precautions: Fall Precaution Comments: instructed no pillow under knee Restrictions Weight Bearing Restrictions: No Other Position/Activity Restrictions: WBAT    Mobility  Bed Mobility Overal bed mobility: Needs Assistance Bed Mobility: Supine to Sit;Sit to Supine     Supine to sit: Supervision Sit to supine: Supervision;Min guard   General bed mobility comments: cues for use of bed rail, assist to bring Rt LE    Transfers Overall transfer level: Needs assistance Equipment used: Rolling walker (2 wheeled) Transfers: Sit to/from [facility] Sit to Stand: Supervision;Min guard Stand pivot transfers: Supervision;Min guard       General transfer comment: 25% VC's on proper hand placement and increased time.  Also assisted with a toilet transfer.  Ambulation/Gait Ambulation/Gait assistance: Min guard;Min assist Gait Distance (Feet): 40 Feet Assistive device: Rolling walker (2 wheeled) Gait Pattern/deviations: Step-to pattern;Decreased stance time - right Gait velocity: decreased   General Gait Details: 50% VC's on proper wal;ker to  self distance and proper sequencing.  Walker to close to pt, instructed for correction.   Stairs Stairs: Yes Stairs assistance: Min guard;Supervision Stair Management: Two rails;Step to pattern;Forwards Number of Stairs: 2 General stair comments: 25% VC's on proper tech, sequencing and safety   Wheelchair Mobility    Modified Rankin (Stroke Patients Only)       Balance                                            Cognition Arousal/Alertness: Awake/alert Behavior During Therapy: WFL for tasks assessed/performed Overall Cognitive Status: Within Functional Limits for tasks assessed                                 General Comments: AxO x 3 very pleasant      Exercises      General Comments        Pertinent Vitals/Pain Pain Assessment: 0-10 Pain Score: 3  Pain Location: Rt knee Pain Descriptors / Indicators: Aching;Discomfort Pain Intervention(s): Monitored during session;Premedicated before session;Repositioned;Ice applied    Home Living                      Prior Function            PT Goals (current goals can now be found in the care plan section) Progress towards PT goals: Progressing toward goals    Frequency    7X/week      PT Plan Current plan remains appropriate    Co-evaluation  AM-PAC PT "6 Clicks" Mobility   Outcome Measure  Help needed turning from your back to your side while in a flat bed without using bedrails?: A Little Help needed moving from lying on your back to sitting on the side of a flat bed without using bedrails?: A Little Help needed moving to and from a bed to a chair (including a wheelchair)?: A Little Help needed standing up from a chair using your arms (e.g., wheelchair or bedside chair)?: A Little Help needed to walk in hospital room?: A Little Help needed climbing 3-5 steps with a railing? : A Little 6 Click Score: 18    End of Session Equipment Utilized  During Treatment: Gait belt Activity Tolerance: Patient tolerated treatment well Patient left: in bed;with call bell/phone within reach;with family/visitor present Nurse Communication: Mobility status PT Visit Diagnosis: Muscle weakness (generalized) (M62.81);Difficulty in walking, not elsewhere classified (R26.2)     Time: 1110-1140 PT Time Calculation (min) (ACUTE ONLY): 30 min  Charges:  $Gait Training: 8-22 mins $Therapeutic Activity: 8-22 mins                     Rica Koyanagi  PTA Acute  Rehabilitation Services Pager      [phone] Office      [phone]

## 2020-12-14 NOTE — Progress Notes (Signed)
Physical Therapy Treatment Patient Details Name: [name] MRN: [id number] DOB: May 23, 1951 Today's Date: 12/14/2020    History of Present Illness Patient is a 70 y.o. 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    PT Comments    POD # 1 pm session Assisted OOB to amb to bathroom then in hallway.  Then returned to room to perform some TE's following HEP handout.  Instructed on proper tech, freq as well as use of ICE.   Addressed all mobility questions, discussed appropriate activity, educated on use of ICE.  Pt ready for D/C to home.   Follow Up Recommendations  Follow surgeon's recommendation for DC plan and follow-up therapies;Home health PT     Equipment Recommendations  3in1 (PT)    Recommendations for Other Services       Precautions / Restrictions Precautions Precautions: Fall Precaution Comments: instructed no pillow under knee Restrictions Weight Bearing Restrictions: No Other Position/Activity Restrictions: WBAT    Mobility  Bed Mobility Overal bed mobility: Needs Assistance Bed Mobility: Supine to Sit;Sit to Supine     Supine to sit: Supervision Sit to supine: Supervision;Min guard   General bed mobility comments: cues for use of bed rail, assist to bring Rt LE    Transfers Overall transfer level: Needs assistance Equipment used: Rolling walker (2 wheeled) Transfers: Sit to/from [facility] Sit to Stand: Supervision;Min guard Stand pivot transfers: Supervision;Min guard       General transfer comment: 25% VC's on proper hand placement and increased time.  Also assisted with a toilet transfer.  Ambulation/Gait Ambulation/Gait assistance: Min guard;Min assist Gait Distance (Feet): 40 Feet Assistive device: Rolling  walker (2 wheeled) Gait Pattern/deviations: Step-to pattern;Decreased stance time - right Gait velocity: decreased   General Gait Details: 50% VC's on proper wal;ker to self distance and proper sequencing.  Walker to close to pt, instructed for correction.   Stairs  Wheelchair Mobility    Modified Rankin (Stroke Patients Only)       Balance                                            Cognition Arousal/Alertness: Awake/alert Behavior During Therapy: WFL for tasks assessed/performed Overall Cognitive Status: Within Functional Limits for tasks assessed                                 General Comments: AxO x 3 very pleasant      Exercises   Total Knee Replacement TE's following HEP handout 10 reps B LE ankle pumps 05 reps towel squeezes 05 reps knee presses 05 reps heel slides  05 reps SAQ's 05 reps SLR's 05 reps ABD Educated on use of gait belt to assist with TE's Followed by ICE    General Comments        Pertinent Vitals/Pain Pain Assessment: 0-10 Pain Score: 3  Pain Location: Rt knee Pain Descriptors / Indicators: Aching;Discomfort Pain Intervention(s): Monitored during session;Premedicated before session;Repositioned;Ice applied    Home Living                      Prior Function  PT Goals (current goals can now be found in the care plan section) Progress towards PT goals: Progressing toward goals    Frequency    7X/week      PT Plan Current plan remains appropriate    Co-evaluation              AM-PAC PT "6 Clicks" Mobility   Outcome Measure  Help needed turning from your back to your side while in a flat bed without using bedrails?: A Little Help needed moving from lying on your back to sitting on the side of a flat bed without using bedrails?: A Little Help needed moving to and from a bed to a chair (including a wheelchair)?: A Little Help needed standing up from a chair using  your arms (e.g., wheelchair or bedside chair)?: A Little Help needed to walk in hospital room?: A Little Help needed climbing 3-5 steps with a railing? : A Little 6 Click Score: 18    End of Session Equipment Utilized During Treatment: Gait belt Activity Tolerance: Patient tolerated treatment well Patient left: in bed;with call bell/phone within reach;with family/visitor present Nurse Communication: Mobility status PT Visit Diagnosis: Muscle weakness (generalized) (M62.81);Difficulty in walking, not elsewhere classified (R26.2)     Time: 6144-3154 PT Time Calculation (min) (ACUTE ONLY): 30 min  Charges:  $Gait Training: 8-22 mins $Therapeutic Exercise: 8-22 mins                     Rica Koyanagi  PTA Acute  Rehabilitation Services Pager      [phone] Office      [phone]

## 2020-12-15 ENCOUNTER — Encounter (HOSPITAL_COMMUNITY): Payer: Self-pay | Admitting: Orthopedic Surgery

## 2020-12-16 DIAGNOSIS — M25561 Pain in right knee: Secondary | ICD-10-CM | POA: Diagnosis not present

## 2020-12-19 DIAGNOSIS — M25561 Pain in right knee: Secondary | ICD-10-CM | POA: Diagnosis not present

## 2020-12-19 NOTE — Discharge Summary (Signed)
Patient ID: [name] MRN: [id number] DOB/AGE: Oct 12, 1951 70 y.o.  Admit date: 12/13/2020 Discharge date: 12/14/2020  Admission Diagnoses:  Active Problems:   S/P total knee arthroplasty, right   Obese   Discharge Diagnoses:  Same  Past Medical History:  Diagnosis Date  . Arthritis   . Cataract   . Diverticulosis   . H/O blood transfusion reaction 2010   hives  . H/O migraine    last 4yrs ago  . Headache(784.0)   . Heart murmur    takes Bystolic nightly  . History of blood clots 7yrs   was on Lovenox injections and Coumadin(was only on that for short period of time)  . History of shingles 3-13yrs ago  . Hx of seasonal allergies    takes Levocetirizine prn  . Hyperlipidemia    takes Crestor nightly  . Hypertension    takes Maxzide daily  . Insomnia    takes Ambien prn  . Joint pain   . Joint swelling   . Moderate tricuspid regurgitation   . Pre-diabetes   . Trigeminal neuralgia of right side of face 03/26/2016   V2 distribution    Surgeries: Procedure(s): RIGHT TOTAL KNEE ARTHROPLASTY on 12/13/2020   Consultants: N/A  Discharged Condition: Improved  Hospital Course: [name] is an 70 y.o. female who was admitted 12/13/2020 for operative treatment of right knee OA . Patient has severe unremitting pain that affects sleep, daily activities, and work/hobbies. After pre-op clearance the patient was taken to the operating room on 12/13/2020 and underwent  Procedure(s): RIGHT TOTAL KNEE ARTHROPLASTY.    Patient was given perioperative antibiotics:  Anti-infectives (From admission, onward)   Start     Dose/Rate Route Frequency Ordered Stop   12/13/20 1400  ceFAZolin (ANCEF) IVPB 2g/100 mL premix        2 g 200 mL/hr over 30 Minutes Intravenous Every 6 hours 12/13/20 1116 12/13/20 2031   12/13/20 0600  ceFAZolin (ANCEF) IVPB 2g/100 mL premix        2 g 200 mL/hr over 30 Minutes Intravenous On call to O.R. 12/13/20 [id number] 12/13/20 0746   12/13/20 0532   ceFAZolin (ANCEF) 2-4 GM/100ML-% IVPB       Note to Pharmacy: Randa Evens  : cabinet override      12/13/20 0532 12/13/20 0725       Patient was given sequential compression devices, early ambulation, and chemoprophylaxis to prevent DVT.  Patient benefited maximally from hospital stay and there were no complications.    Recent vital signs: No data found.   Recent laboratory studies: No results for input(s): WBC, HGB, HCT, PLT, NA, K, CL, CO2, BUN, CREATININE, GLUCOSE, INR, CALCIUM in the last 72 hours.  Invalid input(s): PT, 2   Discharge Medications:   Allergies as of 12/14/2020      Reactions   Ibuprofen Hives, Swelling   Penicillins Hives, Swelling   Has patient had a PCN reaction causing immediate rash, facial/tongue/throat swelling, SOB or lightheadedness with hypotension: Yes Has patient had a PCN reaction causing severe rash involving mucus membranes or skin necrosis: No Has patient had a PCN reaction that required hospitalization: No Has patient had a PCN reaction occurring within the last 10 years: No If all of the above answers are "NO", then may proceed with Cephalosporin use. Tolerated Cephalosporin Date: 12/13/20.      Medication List    STOP taking these medications   traMADol 50 MG tablet Commonly known as: ULTRAM   TYLENOL 8  HOUR ARTHRITIS PAIN PO     TAKE these medications   Bystolic 5 MG tablet Generic drug: nebivolol TAKE 1 TABLET DAILY What changed: how much to take   Dialyvite Vitamin D 5000 125 MCG (5000 UT) capsule Generic drug: Cholecalciferol Take 5,000 Units by mouth daily.   diphenhydrAMINE 25 MG tablet Commonly known as: BENADRYL Take 25 mg by mouth at bedtime as needed for itching.   docusate sodium 100 MG capsule Commonly known as: Colace Take 1 capsule (100 mg total) by mouth 2 (two) times daily. What changed:   when to take this  reasons to take this   ferrous sulfate 325 (65 FE) MG tablet Commonly known as:  FerrouSul Take 1 tablet (325 mg total) by mouth 3 (three) times daily with meals for 14 days.   HYDROcodone-acetaminophen 7.5-325 MG tablet Commonly known as: Norco Take 1-2 tablets by mouth every 4 (four) hours as needed for moderate pain.   methocarbamol 500 MG tablet Commonly known as: Robaxin Take 1 tablet (500 mg total) by mouth every 6 (six) hours as needed for muscle spasms.   polyethylene glycol 17 g packet Commonly known as: MIRALAX / GLYCOLAX Take 17 g by mouth 2 (two) times daily.   rivaroxaban 10 MG Tabs tablet Commonly known as: Xarelto Take 1 tablet (10 mg total) by mouth daily.   rosuvastatin 10 MG tablet Commonly known as: CRESTOR TAKE 1 TABLET DAILY   triamterene-hydrochlorothiazide 37.5-25 MG capsule Commonly known as: DYAZIDE TAKE 1 CAPSULE DAILY   zolpidem 5 MG tablet Commonly known as: AMBIEN TAKE 1 TABLET AT BEDTIME AS NEEDED FOR SLEEP What changed:   reasons to take this  additional instructions            Discharge Care Instructions  (From admission, onward)         Start     Ordered   12/14/20 0000  Change dressing       Comments: Maintain surgical dressing until follow up in the clinic. If the edges start to pull up, may reinforce with tape. If the dressing is no longer working, may remove and cover with gauze and tape, but must keep the area dry and clean.  Call with any questions or concerns.   12/14/20 0843          Diagnostic Studies: No results found.  Disposition: Home  Discharge Instructions    Call MD / Call 911   Complete by: As directed    If you experience chest pain or shortness of breath, CALL 911 and be transported to the hospital emergency room.  If you develope a fever above 101 F, pus (white drainage) or increased drainage or redness at the wound, or calf pain, call your surgeon's office.   Change dressing   Complete by: As directed    Maintain surgical dressing until follow up in the clinic. If the edges  start to pull up, may reinforce with tape. If the dressing is no longer working, may remove and cover with gauze and tape, but must keep the area dry and clean.  Call with any questions or concerns.   Constipation Prevention   Complete by: As directed    Drink plenty of fluids.  Prune juice may be helpful.  You may use a stool softener, such as Colace (over the counter) 100 mg twice a day.  Use MiraLax (over the counter) for constipation as needed.   Diet - low sodium heart healthy   Complete by: As  directed    Discharge instructions   Complete by: As directed    Maintain surgical dressing until follow up in the clinic. If the edges start to pull up, may reinforce with tape. If the dressing is no longer working, may remove and cover with gauze and tape, but must keep the area dry and clean.  Follow up in 2 weeks at [facility]. Call with any questions or concerns.   Increase activity slowly as tolerated   Complete by: As directed    Weight bearing as tolerated with assist device (walker, cane, etc) as directed, use it as long as suggested by your surgeon or therapist, typically at least 4-6 weeks.   TED hose   Complete by: As directed    Use stockings (TED hose) for 2 weeks on both leg(s).  You may remove them at night for sleeping.       Follow-up Information    Rosilyn Mings.. Go on 12/16/2020.   Why: You are scheduled for a physical therapy appointment on 12-16-20 at 1:45 pm.  Contact information: [address] [place] [address] [phone]        Danae Orleans, PA-C. Go on 12/28/2020.   Specialty: Orthopedic Surgery Why: You are scheduled for a post-operative appointment on 12-28-20 at 2:45 pm. Contact information: [address] [place] [address] [phone]                Signed: Lucille Passy [facility] 12/19/2020, 1:16 PM

## 2020-12-23 DIAGNOSIS — M25561 Pain in right knee: Secondary | ICD-10-CM | POA: Diagnosis not present

## 2020-12-27 ENCOUNTER — Telehealth: Payer: Self-pay

## 2020-12-27 NOTE — Telephone Encounter (Signed)
The pt canceled her appt for tomorrow with Dr. Baird Cancer because she said she doesn't think she can make it because she recently had knee surgery.  The pt was asked if she wanted to do her AWV over the phone and the pt said yes.

## 2020-12-28 ENCOUNTER — Ambulatory Visit: Payer: Medicare Other | Admitting: Internal Medicine

## 2020-12-28 DIAGNOSIS — M25561 Pain in right knee: Secondary | ICD-10-CM | POA: Diagnosis not present

## 2020-12-29 ENCOUNTER — Encounter: Payer: Self-pay | Admitting: Internal Medicine

## 2020-12-30 DIAGNOSIS — M25561 Pain in right knee: Secondary | ICD-10-CM | POA: Diagnosis not present

## 2021-01-03 DIAGNOSIS — M25561 Pain in right knee: Secondary | ICD-10-CM | POA: Diagnosis not present

## 2021-01-05 DIAGNOSIS — M25561 Pain in right knee: Secondary | ICD-10-CM | POA: Diagnosis not present

## 2021-01-10 DIAGNOSIS — M25561 Pain in right knee: Secondary | ICD-10-CM | POA: Diagnosis not present

## 2021-01-16 DIAGNOSIS — M25551 Pain in right hip: Secondary | ICD-10-CM | POA: Diagnosis not present

## 2021-01-16 DIAGNOSIS — Z471 Aftercare following joint replacement surgery: Secondary | ICD-10-CM | POA: Diagnosis not present

## 2021-01-16 DIAGNOSIS — Z96651 Presence of right artificial knee joint: Secondary | ICD-10-CM | POA: Diagnosis not present

## 2021-01-16 DIAGNOSIS — Z96641 Presence of right artificial hip joint: Secondary | ICD-10-CM | POA: Diagnosis not present

## 2021-01-17 DIAGNOSIS — M25561 Pain in right knee: Secondary | ICD-10-CM | POA: Diagnosis not present

## 2021-01-25 ENCOUNTER — Other Ambulatory Visit: Payer: Self-pay | Admitting: Internal Medicine

## 2021-01-25 ENCOUNTER — Ambulatory Visit (INDEPENDENT_AMBULATORY_CARE_PROVIDER_SITE_OTHER): Payer: Medicare Other

## 2021-01-25 VITALS — Ht 60.0 in | Wt 165.0 lb

## 2021-01-25 DIAGNOSIS — Z0000 Encounter for general adult medical examination without abnormal findings: Secondary | ICD-10-CM | POA: Diagnosis not present

## 2021-01-25 DIAGNOSIS — Z1231 Encounter for screening mammogram for malignant neoplasm of breast: Secondary | ICD-10-CM

## 2021-01-25 NOTE — Progress Notes (Signed)
I connected with [name] today by telephone and verified that I am speaking with the correct person using two identifiers. Location patient: home Location provider: work Persons participating in the virtual visit: Bianka, Liberati LPN.   I discussed the limitations, risks, security and privacy concerns of performing an evaluation and management service by telephone and the availability of in person appointments. I also discussed with the patient that there may be a patient responsible charge related to this service. The patient expressed understanding and verbally consented to this telephonic visit.    Interactive audio and video telecommunications were attempted between this provider and patient, however failed, due to patient having technical difficulties OR patient did not have access to video capability.  We continued and completed visit with audio only.     Vital signs may be patient reported or missing.  Subjective:   [name] is a 70 y.o. female who presents for Medicare Annual (Subsequent) preventive examination.  Review of Systems     Cardiac Risk Factors include: advanced age (>60mn, >>34women);dyslipidemia;hypertension;obesity (BMI >30kg/m2)     Objective:    Today's Vitals   01/25/21 1512 01/25/21 1513  Weight: 165 lb (74.8 kg)   Height: 5' (1.524 m)   PainSc:  6    Body mass index is 32.22 kg/m.  Advanced Directives 01/25/2021 12/13/2020 12/07/2020 12/24/2019 12/23/2018 12/17/2018 09/17/2018  Does Patient Have a Medical Advance Directive? _0  No No  Would patient like information on creating a medical advance directive? - No - Patient declined - Yes (MAU/Ambulatory/Procedural Areas - Information given) No - Patient declined Yes (MAU/Ambulatory/Procedural Areas - Information given) -  Pre-existing out of facility DNR order (yellow form or pink MOST form) - - - - - - -    Current Medications (verified) Outpatient Encounter Medications as  of 01/25/2021  Medication Sig  . BYSTOLIC 5 MG tablet TAKE 1 TABLET DAILY (Patient taking differently: Take 5 mg by mouth daily.)  . Cholecalciferol (DIALYVITE VITAMIN D 5000) 125 MCG (5000 UT) capsule Take 5,000 Units by mouth daily.  . diphenhydrAMINE (BENADRYL) 25 MG tablet Take 25 mg by mouth at bedtime as needed for itching.   . docusate sodium (COLACE) 100 MG capsule Take 1 capsule (100 mg total) by mouth 2 (two) times daily.  .Marland KitchenHYDROcodone-acetaminophen (NORCO) 7.5-325 MG tablet Take 1-2 tablets by mouth every 4 (four) hours as needed for moderate pain.  . methocarbamol (ROBAXIN) 500 MG tablet Take 1 tablet (500 mg total) by mouth every 6 (six) hours as needed for muscle spasms.  . rosuvastatin (CRESTOR) 10 MG tablet TAKE 1 TABLET DAILY (Patient taking differently: Take 10 mg by mouth daily.)  . triamterene-hydrochlorothiazide (DYAZIDE) 37.5-25 MG capsule TAKE 1 CAPSULE DAILY (Patient taking differently: Take 1 capsule by mouth daily.)  . zolpidem (AMBIEN) 5 MG tablet TAKE 1 TABLET AT BEDTIME AS NEEDED FOR SLEEP (Patient taking differently: Take 5 mg by mouth at bedtime as needed for sleep.)  . ferrous sulfate (FERROUSUL) 325 (65 FE) MG tablet Take 1 tablet (325 mg total) by mouth 3 (three) times daily with meals for 14 days.  . polyethylene glycol (MIRALAX / GLYCOLAX) 17 g packet Take 17 g by mouth 2 (two) times daily. (Patient not taking: Reported on 01/25/2021)  . rivaroxaban (XARELTO) 10 MG TABS tablet Take 1 tablet (10 mg total) by mouth daily. (Patient not taking: Reported on 01/25/2021)   No facility-administered encounter medications on file as of 01/25/2021.  Allergies (verified) Ibuprofen and Penicillins   History: Past Medical History:  Diagnosis Date  . Arthritis   . Cataract   . Diverticulosis   . H/O blood transfusion reaction 2010   hives  . H/O migraine    last 44yr ago  . Headache(784.0)   . Heart murmur    takes Bystolic nightly  . History of blood clots 465yr   was on Lovenox injections and Coumadin(was only on that for short period of time)  . History of shingles [id number]  . Hx of seasonal allergies    takes Levocetirizine prn  . Hyperlipidemia    takes Crestor nightly  . Hypertension    takes Maxzide daily  . Insomnia    takes Ambien prn  . Joint pain   . Joint swelling   . Moderate tricuspid regurgitation   . Pre-diabetes   . Trigeminal neuralgia of right side of face 03/26/2016   V2 distribution   Past Surgical History:  Procedure Laterality Date  . ABDOMINAL HYSTERECTOMY    . BREAST BIOPSY  05/03/2006   US US BREAST REDUCTION SURGERY  1987  . BUNIONECTOMY Right 05/11/2019  . CATARACT EXTRACTION Left 07/2019  . CHOLECYSTECTOMY    . COLONOSCOPY    . ESOPHAGOGASTRODUODENOSCOPY    . EYE SURGERY Right    eye lash removed  . JOINT REPLACEMENT     bilateral hip rt in 2007 and lt in 2010  . NM MYOCAR PERF WALL MOTION  11/04/2008   Normal  . REDUCTION MAMMAPLASTY Bilateral 1988  . REDUCTION MAMMAPLASTY    . SHOULDER ARTHROSCOPY WITH DISTAL CLAVICLE RESECTION Right 09/21/2015   Procedure: RIGHT SHOULDER ARTHROSCOPY WITH DISTAL CLAVICLE EXCISION DEBRIDE LABRAL TEAR ACROMIOPLASTY ;  Surgeon: FraFrederik PearD;  [facility]  Laterality: Right;  . TEE WITHOUT CARDIOVERSION N/A 04/12/2015   Procedure: TRANSESOPHAGEAL ECHOCARDIOGRAM (TEE)/BUBBLE STUDY;  Surgeon: JayAdrian ProwsD;  [facility];  Laterality: N/A;  . TOTAL HIP REVISION  04/14/2012   Procedure: TOTAL HIP REVISION;  Surgeon: FraKerin SalenD;  [facility];  Laterality: Right;  right total hip revision  . TOTAL HIP REVISION Left 11/30/2013   Procedure: TOTAL HIP REVISION;  Surgeon: FraKerin SalenD;  [facility];  Laterality: Left;  . TOTAL KNEE ARTHROPLASTY Right 12/13/2020   Procedure: TOTAL KNEE ARTHROPLASTY;  Surgeon: OliParalee CancelD;  [facility];  Service: Orthopedics;  Laterality: Right;  70 mins   Family History  Problem Relation Age of Onset  . Heart attack Mother   . Alzheimer's disease Mother   . Diabetes Sister   . Hypertension Sister    Social History   Socioeconomic History  . Marital status: Married    Spouse name: Not on file  . Number of children: 2  . Years of education: 14 70 Highest education level: Not on file  Occupational History  . Occupation: Retired  Tobacco Use  . Smoking status: Former Smoker    Packs/day: 0.25    Years: 42.00    Pack years: 10.50    Types: Cigarettes    Quit date: 10/22/2018    Years since quitting: 2.2  . Smokeless tobacco: Never Used  Vaping Use  . Vaping Use: Never used  Substance and Sexual Activity  . Alcohol use: Not Currently    Comment: lst dirnk 2 years ago   .  Drug use: No  . Sexual activity: Not Currently    Birth control/protection: Surgical  Other Topics Concern  . Not on file  Social History Narrative   Lives at home w/ her husband   Right-handed   About 1 cup of coffee every other day   Social Determinants of Health   Financial Resource Strain: Low Risk   . Difficulty of Paying Living Expenses: Not hard at all  Food Insecurity: No Food Insecurity  . Worried About Charity fundraiser in the Last Year: Never true  . Ran Out of Food in the Last Year: Never true  Transportation Needs: No Transportation Needs  . Lack of Transportation (Medical): No  . Lack of Transportation (Non-Medical): No  Physical Activity: Sufficiently Active  . Days of Exercise per Week: 7 days  . Minutes of Exercise per Session: 30 min  Stress: No Stress Concern Present  . Feeling of Stress : Not at all  Social Connections: Not on file    Tobacco Counseling Counseling given: Not Answered   Clinical Intake:  Pre-visit preparation completed: Yes  Pain : 0-10 Pain Score: 6  Pain Type: Acute pain Pain Location: Knee Pain Orientation: Right Pain Descriptors /  Indicators: Aching,Spasm Pain Onset:  (had knee surgery) Pain Frequency: Constant     Nutritional Status: BMI > 30  Obese Nutritional Risks: None Diabetes: No  How often do you need to have someone help you when you read instructions, pamphlets, or other written materials from your doctor or pharmacy?: 1 - Never What is the last grade level you completed in school?: some college  Diabetic? no  Interpreter Needed?: No  Information entered by :: NAllen LPN   Activities of Daily Living In your present state of health, do you have any difficulty performing the following activities: 01/25/2021 12/13/2020  Hearing? N -  Vision? N -  Difficulty concentrating or making decisions? N -  Walking or climbing stairs? Y -  Comment due to knee surgery -  Dressing or bathing? N -  Doing errands, shopping? N N  Preparing Food and eating ? N -  Using the Toilet? N -  In the past six months, have you accidently leaked urine? N -  Do you have problems with loss of bowel control? N -  Managing your Medications? N -  Managing your Finances? N -  Housekeeping or managing your Housekeeping? N -  Some recent data might be hidden    Patient Care Team: Glendale Chard, MD as PCP - General (Internal Medicine) Katy Fitch, Darlina Guys, MD as Consulting Physician (Ophthalmology)  Indicate any recent Medical Services you may have received from other than [facility] providers in the past year (date may be approximate).     Assessment:   This is a routine wellness examination for [name].  Hearing/Vision screen  Hearing Screening   125Hz 250Hz 500Hz 1000Hz 2000Hz 3000Hz 4000Hz 6000Hz 8000Hz  Right ear:           Left ear:           Vision Screening Comments: Regular eye exams, Dr. Katy Fitch  Dietary issues and exercise activities discussed: Current Exercise Habits: Home exercise routine, Type of exercise: stretching;strength training/weights, Time (Minutes): 30, Frequency (Times/Week): 7, Weekly Exercise  (Minutes/Week): 210  Goals    .  Patient Stated (pt-stated)      Plans to re join the gym    .  Patient Stated      01/25/2021, wants to get knee  better (want to get back to normal)    .  Weight (lb) < 200 lb (90.7 kg)      12/24/2019, wants to get down to 140 pounds and start exercising again.      Depression Screen PHQ 2/9 Scores 01/25/2021 12/24/2019 01/12/2019 12/17/2018 12/17/2018 11/11/2018  PHQ - 2 Score 0 0 0 0 0 0  PHQ- 9 Score - 0 - 0 - -    Fall Risk Fall Risk  01/25/2021 12/24/2019 01/12/2019 12/17/2018 12/17/2018  Falls in the past year? 0 0 0 0 0  Comment - - - - -  Risk for fall due to : Medication side effect;Impaired balance/gait Medication side effect - Medication side effect -  Follow up Falls evaluation completed;Education provided;Falls prevention discussed Falls evaluation completed;Education provided;Falls prevention discussed - Education provided;Falls prevention discussed -    FALL RISK PREVENTION PERTAINING TO THE HOME:  Any stairs in or around the home? Yes  If so, are there any without handrails? No  Home free of loose throw rugs in walkways, pet beds, electrical cords, etc? Yes  Adequate lighting in your home to reduce risk of falls? Yes   ASSISTIVE DEVICES UTILIZED TO PREVENT FALLS:  Life alert? No  Use of a cane, walker or w/c? No  Grab bars in the bathroom? Yes  Shower chair or bench in shower? Yes  Elevated toilet seat or a handicapped toilet? Yes   TIMED UP AND GO:  Was the test performed? No .      Cognitive Function:     6CIT Screen 01/25/2021 12/24/2019 12/17/2018  What Year? 0 points 0 points 0 points  What month? 0 points 0 points 0 points  What time? 0 points 0 points 0 points  Count back from 20 0 points 0 points 0 points  Months in reverse 0 points 0 points 0 points  Repeat phrase 2 points 0 points 0 points  Total Score 2 0 0    Immunizations Immunization History  Administered Date(s) Administered  . Fluad Quad(high Dose 65+)  06/29/2020  . Influenza, High Dose Seasonal PF 10/23/2017, 08/11/2018, 06/17/2019  . PFIZER(Purple Top)SARS-COV-2 Vaccination 11/12/2019, 12/04/2019, 07/26/2020  . Pneumococcal Conjugate-13 11/08/2017  . Tdap 08/23/2017    TDAP status: Up to date  Flu Vaccine status: Up to date  Pneumococcal vaccine status: Due, Education has been provided regarding the importance of this vaccine. Advised may receive this vaccine at local pharmacy or Health Dept. Aware to provide a copy of the vaccination record if obtained from local pharmacy or Health Dept. Verbalized acceptance and understanding.  Covid-19 vaccine status: Completed vaccines  Qualifies for Shingles Vaccine? Yes   Zostavax completed No   Shingrix Completed?: No.    Education has been provided regarding the importance of this vaccine. Patient has been advised to call insurance company to determine out of pocket expense if they have not yet received this vaccine. Advised may also receive vaccine at local pharmacy or Health Dept. Verbalized acceptance and understanding.  Screening Tests Health Maintenance  Topic Date Due  . PNA vac Low Risk Adult (2 of 2 - PPSV23) 11/08/2018  . INFLUENZA VACCINE  05/22/2021  . MAMMOGRAM  01/13/2022  . COLONOSCOPY (Pts 45-4yr Insurance coverage will need to be confirmed)  04/09/2027  . TETANUS/TDAP  08/24/2027  . DEXA SCAN  Completed  . COVID-19 Vaccine  Completed  . Hepatitis C Screening  Completed  . [place]  Maintenance Due  Topic Date Due  . PNA vac Low Risk Adult (2 of 2 - PPSV23) 11/08/2018    Colorectal cancer screening: Type of screening: Colonoscopy. Completed 04/08/2017. Repeat every 5 years  Mammogram status: scheduled for 03/16/2021  Bone Density status: Completed 09/27/2015.   Lung Cancer Screening: (Low Dose CT Chest recommended if Age 43-80 years, 30 pack-year currently smoking OR have quit w/in 15years.) does not qualify.   Lung  Cancer Screening Referral: no  Additional Screening:  Hepatitis C Screening: does qualify; Completed 05/11/2018  Vision Screening: Recommended annual ophthalmology exams for early detection of glaucoma and other disorders of the eye. Is the patient up to date with their annual eye exam?  Yes  Who is the provider or what is the name of the office in which the patient attends annual eye exams? Dr. Katy Fitch If pt is not established with a provider, would they like to be referred to a provider to establish care? No .   Dental Screening: Recommended annual dental exams for proper oral hygiene  Community Resource Referral / Chronic Care Management: CRR required this visit?  No   CCM required this visit?  No      Plan:     I have personally reviewed and noted the following in the patient's chart:   . Medical and social history . Use of alcohol, tobacco or illicit drugs  . Current medications and supplements . Functional ability and status . Nutritional status . Physical activity . Advanced directives . List of other physicians . Hospitalizations, surgeries, and ER visits in previous 12 months . Vitals . Screenings to include cognitive, depression, and falls . Referrals and appointments  In addition, I have reviewed and discussed with patient certain preventive protocols, quality metrics, and best practice recommendations. A written personalized care plan for preventive services as well as general preventive health recommendations were provided to patient.     Kellie Simmering, LPN   01/22/8767   Nurse Notes:

## 2021-01-25 NOTE — Patient Instructions (Signed)
[name] , Thank you for taking time to come for your Medicare Wellness Visit. I appreciate your ongoing commitment to your health goals. Please review the following plan we discussed and let me know if I can assist you in the future.   Screening recommendations/referrals: Colonoscopy: completed 04/08/2017, due 04/08/2022 Mammogram: scheduled for 03/16/2021 Bone Density: completed 09/27/2015 Recommended yearly ophthalmology/optometry visit for glaucoma screening and checkup Recommended yearly dental visit for hygiene and checkup  Vaccinations: Influenza vaccine: completed 06/29/2020, due 05/22/2021 Pneumococcal vaccine: due Tdap vaccine: completed 08/23/2017, due 08/24/2027 Shingles vaccine: discussed   Covid-19: 07/26/2020, 11/03/2019, 11/12/2019  Advanced directives: Advance directive discussed with you today.   Conditions/risks identified: none  Next appointment: Follow up in one year for your annual wellness visit    Preventive Care 65 Years and Older, Female Preventive care refers to lifestyle choices and visits with your health care provider that can promote health and wellness. What does preventive care include?  A yearly physical exam. This is also called an annual well check.  Dental exams once or twice a year.  Routine eye exams. Ask your health care provider how often you should have your eyes checked.  Personal lifestyle choices, including:  Daily care of your teeth and gums.  Regular physical activity.  Eating a healthy diet.  Avoiding tobacco and drug use.  Limiting alcohol use.  Practicing safe sex.  Taking low-dose aspirin every day.  Taking vitamin and mineral supplements as recommended by your health care provider. What happens during an annual well check? The services and screenings done by your health care provider during your annual well check will depend on your age, overall health, lifestyle risk factors, and family history of disease. Counseling   Your health care provider may ask you questions about your:  Alcohol use.  Tobacco use.  Drug use.  Emotional well-being.  Home and relationship well-being.  Sexual activity.  Eating habits.  History of falls.  Memory and ability to understand (cognition).  Work and work Statistician.  Reproductive health. Screening  You may have the following tests or measurements:  Height, weight, and BMI.  Blood pressure.  Lipid and cholesterol levels. These may be checked every 5 years, or more frequently if you are over 66 years old.  Skin check.  Lung cancer screening. You may have this screening every year starting at age 74 if you have a 30-pack-year history of smoking and currently smoke or have quit within the past 15 years.  Fecal occult blood test (FOBT) of the stool. You may have this test every year starting at age 95.  Flexible sigmoidoscopy or colonoscopy. You may have a sigmoidoscopy every 5 years or a colonoscopy every 10 years starting at age 64.  Hepatitis C blood test.  Hepatitis B blood test.  Sexually transmitted disease (STD) testing.  Diabetes screening. This is done by checking your blood sugar (glucose) after you have not eaten for a while (fasting). You may have this done every 1-3 years.  Bone density scan. This is done to screen for osteoporosis. You may have this done starting at age 17.  Mammogram. This may be done every 1-2 years. Talk to your health care provider about how often you should have regular mammograms. Talk with your health care provider about your test results, treatment options, and if necessary, the need for more tests. Vaccines  Your health care provider may recommend certain vaccines, such as:  Influenza vaccine. This is recommended every year.  Tetanus, diphtheria,  and acellular pertussis (Tdap, Td) vaccine. You may need a Td booster every 10 years.  Zoster vaccine. You may need this after age 61.  Pneumococcal 13-valent  conjugate (PCV13) vaccine. One dose is recommended after age 45.  Pneumococcal polysaccharide (PPSV23) vaccine. One dose is recommended after age 95. Talk to your health care provider about which screenings and vaccines you need and how often you need them. This information is not intended to replace advice given to you by your health care provider. Make sure you discuss any questions you have with your health care provider. Document Released: 11/04/2015 Document Revised: 06/27/2016 Document Reviewed: 08/09/2015 Elsevier Interactive Patient Education  2017 [place] Prevention in the Home Falls can cause injuries. They can happen to people of all ages. There are many things you can do to make your home safe and to help prevent falls. What can I do on the outside of my home?  Regularly fix the edges of walkways and driveways and fix any cracks.  Remove anything that might make you trip as you walk through a door, such as a raised step or threshold.  Trim any bushes or trees on the path to your home.  Use bright outdoor lighting.  Clear any walking paths of anything that might make someone trip, such as rocks or tools.  Regularly check to see if handrails are loose or broken. Make sure that both sides of any steps have handrails.  Any raised decks and porches should have guardrails on the edges.  Have any leaves, snow, or ice cleared regularly.  Use sand or salt on walking paths during winter.  Clean up any spills in your garage right away. This includes oil or grease spills. What can I do in the bathroom?  Use night lights.  Install grab bars by the toilet and in the tub and shower. Do not use towel bars as grab bars.  Use non-skid mats or decals in the tub or shower.  If you need to sit down in the shower, use a plastic, non-slip stool.  Keep the floor dry. Clean up any water that spills on the floor as soon as it happens.  Remove soap buildup in the tub or  shower regularly.  Attach bath mats securely with double-sided non-slip rug tape.  Do not have throw rugs and other things on the floor that can make you trip. What can I do in the bedroom?  Use night lights.  Make sure that you have a light by your bed that is easy to reach.  Do not use any sheets or blankets that are too big for your bed. They should not hang down onto the floor.  Have a firm chair that has side arms. You can use this for support while you get dressed.  Do not have throw rugs and other things on the floor that can make you trip. What can I do in the kitchen?  Clean up any spills right away.  Avoid walking on wet floors.  Keep items that you use a lot in easy-to-reach places.  If you need to reach something above you, use a strong step stool that has a grab bar.  Keep electrical cords out of the way.  Do not use floor polish or wax that makes floors slippery. If you must use wax, use non-skid floor wax.  Do not have throw rugs and other things on the floor that can make you trip. What can I do with my  stairs?  Do not leave any items on the stairs.  Make sure that there are handrails on both sides of the stairs and use them. Fix handrails that are broken or loose. Make sure that handrails are as long as the stairways.  Check any carpeting to make sure that it is firmly attached to the stairs. Fix any carpet that is loose or worn.  Avoid having throw rugs at the top or bottom of the stairs. If you do have throw rugs, attach them to the floor with carpet tape.  Make sure that you have a light switch at the top of the stairs and the bottom of the stairs. If you do not have them, ask someone to add them for you. What else can I do to help prevent falls?  Wear shoes that:  Do not have high heels.  Have rubber bottoms.  Are comfortable and fit you well.  Are closed at the toe. Do not wear sandals.  If you use a stepladder:  Make sure that it is fully  opened. Do not climb a closed stepladder.  Make sure that both sides of the stepladder are locked into place.  Ask someone to hold it for you, if possible.  Clearly mark and make sure that you can see:  Any grab bars or handrails.  First and last steps.  Where the edge of each step is.  Use tools that help you move around (mobility aids) if they are needed. These include:  Canes.  Walkers.  Scooters.  Crutches.  Turn on the lights when you go into a dark area. Replace any light bulbs as soon as they burn out.  Set up your furniture so you have a clear path. Avoid moving your furniture around.  If any of your floors are uneven, fix them.  If there are any pets around you, be aware of where they are.  Review your medicines with your doctor. Some medicines can make you feel dizzy. This can increase your chance of falling. Ask your doctor what other things that you can do to help prevent falls. This information is not intended to replace advice given to you by your health care provider. Make sure you discuss any questions you have with your health care provider. Document Released: 08/04/2009 Document Revised: 03/15/2016 Document Reviewed: 11/12/2014 Elsevier Interactive Patient Education  2017 [employer].

## 2021-02-08 ENCOUNTER — Other Ambulatory Visit: Payer: Self-pay | Admitting: Internal Medicine

## 2021-02-22 ENCOUNTER — Other Ambulatory Visit: Payer: Self-pay

## 2021-02-22 ENCOUNTER — Encounter: Payer: Self-pay | Admitting: Internal Medicine

## 2021-02-22 ENCOUNTER — Ambulatory Visit (INDEPENDENT_AMBULATORY_CARE_PROVIDER_SITE_OTHER): Payer: Medicare Other | Admitting: Internal Medicine

## 2021-02-22 VITALS — BP 112/70 | HR 58 | Temp 98.2°F | Ht 61.0 in | Wt 152.4 lb

## 2021-02-22 DIAGNOSIS — I10 Essential (primary) hypertension: Secondary | ICD-10-CM

## 2021-02-22 DIAGNOSIS — I38 Endocarditis, valve unspecified: Secondary | ICD-10-CM

## 2021-02-22 DIAGNOSIS — I509 Heart failure, unspecified: Secondary | ICD-10-CM

## 2021-02-22 DIAGNOSIS — Z23 Encounter for immunization: Secondary | ICD-10-CM

## 2021-02-22 DIAGNOSIS — R7303 Prediabetes: Secondary | ICD-10-CM

## 2021-02-22 NOTE — Patient Instructions (Signed)

## 2021-02-22 NOTE — Progress Notes (Signed)
I,Katawbba Wiggins,acting as a Education administrator for [name], MD.,have documented all relevant documentation on the behalf of [name], MD,as directed by  [name], MD while in the presence of [name], MD.  This visit occurred during the SARS-CoV-2 public health emergency.  Safety protocols were in place, including screening questions prior to the visit, additional usage of staff PPE, and extensive cleaning of exam room while observing appropriate contact time as indicated for disinfecting solutions.  Subjective:     Patient ID: [name] , female    DOB: 07/14/1951 , 70 y.o.   MRN: [id number]   Chief Complaint  Patient presents with  . Hypertension    HPI  The patient is here today for a blood pressure follow-up.  She reports compliance with meds. She denies headaches, chest pain and shortness of breath.  Hypertension This is a chronic problem. The current episode started more than 1 year ago. The problem has been gradually improving since onset. The problem is controlled. Pertinent negatives include no blurred vision. Risk factors for coronary artery disease include dyslipidemia, post-menopausal state, sedentary lifestyle and stress. Past treatments include beta blockers and diuretics. The current treatment provides moderate improvement.     Past Medical History:  Diagnosis Date  . Arthritis   . Cataract   . Diverticulosis   . H/O blood transfusion reaction 2010   hives  . H/O migraine    last 80yrs ago  . Headache(784.0)   . Heart murmur    takes Bystolic nightly  . History of blood clots 17yrs   was on Lovenox injections and Coumadin(was only on that for short period of time)  . History of shingles 3-64yrs ago  . Hx of seasonal allergies    takes Levocetirizine prn  . Hyperlipidemia    takes Crestor nightly  . Hypertension    takes Maxzide daily  . Insomnia    takes Ambien prn  . Joint pain   . Joint swelling   . Moderate tricuspid regurgitation    . Pre-diabetes   . Trigeminal neuralgia of right side of face 03/26/2016   V2 distribution     Family History  Problem Relation Age of Onset  . Heart attack Mother   . Alzheimer's disease Mother   . Diabetes Sister   . Hypertension Sister      Current Outpatient Medications:  .  BYSTOLIC 5 MG tablet, TAKE 1 TABLET DAILY (Patient taking differently: Take 5 mg by mouth daily.), Disp: 90 tablet, Rfl: 3 .  Cholecalciferol (DIALYVITE VITAMIN D 5000) 125 MCG (5000 UT) capsule, Take 5,000 Units by mouth daily., Disp: , Rfl:  .  diphenhydrAMINE (BENADRYL) 25 MG tablet, Take 25 mg by mouth at bedtime as needed for itching. , Disp: , Rfl:  .  methocarbamol (ROBAXIN) 500 MG tablet, Take 1 tablet (500 mg total) by mouth every 6 (six) hours as needed for muscle spasms., Disp: 40 tablet, Rfl: 0 .  rosuvastatin (CRESTOR) 10 MG tablet, TAKE 1 TABLET DAILY (Patient taking differently: Take 10 mg by mouth daily.), Disp: 90 tablet, Rfl: 3 .  triamterene-hydrochlorothiazide (DYAZIDE) 37.5-25 MG capsule, Take 1 each (1 capsule total) by mouth daily., Disp: 90 capsule, Rfl: 1 .  zolpidem (AMBIEN) 5 MG tablet, TAKE 1 TABLET AT BEDTIME AS NEEDED FOR SLEEP (Patient taking differently: Take 5 mg by mouth at bedtime as needed for sleep.), Disp: 60 tablet, Rfl: 1   Allergies  Allergen Reactions  . Ibuprofen Hives  and Swelling  . Penicillins Hives and Swelling    Has patient had a PCN reaction causing immediate rash, facial/tongue/throat swelling, SOB or lightheadedness with hypotension: Yes Has patient had a PCN reaction causing severe rash involving mucus membranes or skin necrosis: No Has patient had a PCN reaction that required hospitalization: No Has patient had a PCN reaction occurring within the last 10 years: No If all of the above answers are "NO", then may proceed with Cephalosporin use. Tolerated Cephalosporin Date: 12/13/20.       Review of Systems  Constitutional: Negative.   Eyes: Negative  for blurred vision.  Respiratory: Negative.   Cardiovascular: Negative.   Gastrointestinal: Negative.   Neurological: Negative.   Psychiatric/Behavioral: Negative.      Today's Vitals   02/22/21 1502  BP: 112/70  Pulse: (!) 58  Temp: 98.2 F (36.8 C)  TempSrc: Oral  Weight: 152 lb 6.4 oz (69.1 kg)  Height: 5\' 1"  (1.549 m)  PainSc: 6    Body mass index is 28.8 kg/m.  Wt Readings from Last 3 Encounters:  02/22/21 152 lb 6.4 oz (69.1 kg)  01/25/21 165 lb (74.8 kg)  12/13/20 165 lb 9.6 oz (75.1 kg)   BP Readings from Last 3 Encounters:  02/22/21 112/70  12/14/20 92/76  12/07/20 104/76   Objective:  Physical Exam Vitals and nursing note reviewed.  Constitutional:      Appearance: Normal appearance.  HENT:     Head: Normocephalic and atraumatic.     Nose:     Comments: Masked     Mouth/Throat:     Comments: Masked  Cardiovascular:     Rate and Rhythm: Normal rate and regular rhythm.     Heart sounds: Normal heart sounds.  Pulmonary:     Effort: Pulmonary effort is normal.     Breath sounds: Normal breath sounds.  Musculoskeletal:     Cervical back: Normal range of motion.  Skin:    General: Skin is warm.  Neurological:     General: No focal deficit present.     Mental Status: She is alert.  Psychiatric:        Mood and Affect: Mood normal.        Behavior: Behavior normal.         Assessment And Plan:     1. Essential hypertension Comments: Chronic, well controlled. She is encouraged to follow low sodium diet.  She will f/u in 4-6 months for re-evaluation.   2. Prediabetes Comments: Her a1c has been elevated in the past. I will recheck this today. She is encouraged to limit her intake of sugary beverages, including diet drinks.  - Hemoglobin A1c  3. 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 Echo results reviewed. Again, importance of dietary compliance was discussed w/  patient.   4. Immunization due Comments: She was given pneumovax-23 IM x 1.  She will get her 2nd COVID booster after her upcoming mammogram.    Patient was given opportunity to ask questions. Patient verbalized understanding of the plan and was able to repeat key elements of the plan. All questions were answered to their satisfaction.   I, [name], MD, have reviewed all documentation for this visit. The documentation on 02/22/21 for the exam, diagnosis, procedures, and orders are all accurate and complete.   IF YOU HAVE BEEN REFERRED TO A SPECIALIST, IT MAY TAKE 1-2 WEEKS TO SCHEDULE/PROCESS THE REFERRAL. IF YOU HAVE NOT  HEARD FROM US/SPECIALIST IN TWO WEEKS, PLEASE GIVE [place] A CALL AT [phone].   THE PATIENT IS ENCOURAGED TO PRACTICE SOCIAL DISTANCING DUE TO THE COVID-19 PANDEMIC.

## 2021-02-23 LAB — HEMOGLOBIN A1C
Est. average glucose Bld gHb Est-mCnc: 131 mg/dL
Hgb A1c MFr Bld: 6.2 % — ABNORMAL HIGH (ref 4.8–5.6)

## 2021-03-16 ENCOUNTER — Ambulatory Visit
Admission: RE | Admit: 2021-03-16 | Discharge: 2021-03-16 | Disposition: A | Discharge status: Home / Self Care | Payer: Medicare Other | Source: Ambulatory Visit | Attending: Internal Medicine | Admitting: Internal Medicine

## 2021-03-16 ENCOUNTER — Other Ambulatory Visit: Payer: Self-pay

## 2021-03-16 DIAGNOSIS — Z1231 Encounter for screening mammogram for malignant neoplasm of breast: Secondary | ICD-10-CM

## 2021-04-15 ENCOUNTER — Other Ambulatory Visit: Payer: Self-pay

## 2021-04-15 ENCOUNTER — Observation Stay (HOSPITAL_COMMUNITY)
Admission: EM | Admit: 2021-04-15 | Discharge: 2021-04-16 | Disposition: A | Discharge status: Home / Self Care | Payer: Medicare Other | Attending: Family Medicine | Admitting: Family Medicine

## 2021-04-15 ENCOUNTER — Observation Stay (HOSPITAL_COMMUNITY): Payer: Medicare Other

## 2021-04-15 ENCOUNTER — Emergency Department (HOSPITAL_COMMUNITY): Payer: Medicare Other

## 2021-04-15 ENCOUNTER — Other Ambulatory Visit: Payer: Self-pay | Admitting: Cardiology

## 2021-04-15 DIAGNOSIS — I110 Hypertensive heart disease with heart failure: Secondary | ICD-10-CM | POA: Diagnosis not present

## 2021-04-15 DIAGNOSIS — Z20822 Contact with and (suspected) exposure to covid-19: Secondary | ICD-10-CM | POA: Diagnosis not present

## 2021-04-15 DIAGNOSIS — I5032 Chronic diastolic (congestive) heart failure: Secondary | ICD-10-CM | POA: Insufficient documentation

## 2021-04-15 DIAGNOSIS — I700 Atherosclerosis of aorta: Secondary | ICD-10-CM | POA: Diagnosis not present

## 2021-04-15 DIAGNOSIS — E785 Hyperlipidemia, unspecified: Secondary | ICD-10-CM | POA: Diagnosis present

## 2021-04-15 DIAGNOSIS — R7303 Prediabetes: Secondary | ICD-10-CM | POA: Insufficient documentation

## 2021-04-15 DIAGNOSIS — R079 Chest pain, unspecified: Secondary | ICD-10-CM | POA: Diagnosis not present

## 2021-04-15 DIAGNOSIS — I509 Heart failure, unspecified: Secondary | ICD-10-CM

## 2021-04-15 DIAGNOSIS — R072 Precordial pain: Secondary | ICD-10-CM

## 2021-04-15 DIAGNOSIS — Z87891 Personal history of nicotine dependence: Secondary | ICD-10-CM | POA: Diagnosis not present

## 2021-04-15 DIAGNOSIS — Z96651 Presence of right artificial knee joint: Secondary | ICD-10-CM | POA: Diagnosis not present

## 2021-04-15 DIAGNOSIS — I517 Cardiomegaly: Secondary | ICD-10-CM | POA: Diagnosis not present

## 2021-04-15 DIAGNOSIS — Z79899 Other long term (current) drug therapy: Secondary | ICD-10-CM | POA: Diagnosis not present

## 2021-04-15 DIAGNOSIS — R0789 Other chest pain: Secondary | ICD-10-CM | POA: Diagnosis not present

## 2021-04-15 DIAGNOSIS — I10 Essential (primary) hypertension: Secondary | ICD-10-CM | POA: Diagnosis present

## 2021-04-15 DIAGNOSIS — Z96643 Presence of artificial hip joint, bilateral: Secondary | ICD-10-CM | POA: Diagnosis not present

## 2021-04-15 DIAGNOSIS — I38 Endocarditis, valve unspecified: Secondary | ICD-10-CM

## 2021-04-15 LAB — BASIC METABOLIC PANEL
Anion gap: 8 (ref 5–15)
BUN: 26 mg/dL — ABNORMAL HIGH (ref 8–23)
CO2: 28 mmol/L (ref 22–32)
Calcium: 9.7 mg/dL (ref 8.9–10.3)
Chloride: 107 mmol/L (ref 98–111)
Creatinine, Ser: 0.72 mg/dL (ref 0.44–1.00)
GFR, Estimated: >60 mL/min (ref >60)
Glucose, Bld: 97 mg/dL (ref 70–99)
Potassium: 4 mmol/L (ref 3.5–5.1)
Sodium: 143 mmol/L (ref 135–145)

## 2021-04-15 LAB — CBC
HCT: 42.2 % (ref 36.0–46.0)
HCT: 29.9 % — ABNORMAL LOW (ref 36.0–46.0)
Hemoglobin: 13.2 g/dL (ref 12.0–15.0)
Hemoglobin: 9.3 g/dL — ABNORMAL LOW (ref 12.0–15.0)
MCH: 27.1 pg (ref 26.0–34.0)
MCH: 27.2 pg (ref 26.0–34.0)
MCHC: 31.3 g/dL (ref 30.0–36.0)
MCHC: 31.1 g/dL (ref 30.0–36.0)
MCV: 86.7 fL (ref 80.0–100.0)
MCV: 87.4 fL (ref 80.0–100.0)
Platelets: 237 10*3/uL (ref 150–400)
Platelets: 160 10*3/uL (ref 150–400)
RBC: 4.87 MIL/uL (ref 3.87–5.11)
RBC: 3.42 MIL/uL — ABNORMAL LOW (ref 3.87–5.11)
RDW: 16.2 % — ABNORMAL HIGH (ref 11.5–15.5)
RDW: 16.1 % — ABNORMAL HIGH (ref 11.5–15.5)
WBC: 3.9 10*3/uL — ABNORMAL LOW (ref 4.0–10.5)
WBC: 3.6 10*3/uL — ABNORMAL LOW (ref 4.0–10.5)
nRBC: 0 % (ref 0.0–0.2)
nRBC: 0 % (ref 0.0–0.2)

## 2021-04-15 LAB — LIPID PANEL
Cholesterol: 70 mg/dL (ref 0–200)
HDL: 33 mg/dL — ABNORMAL LOW
LDL Cholesterol: 29 mg/dL (ref 0–99)
Total CHOL/HDL Ratio: 2.1 ratio
Triglycerides: 39 mg/dL
VLDL: 8 mg/dL (ref 0–40)

## 2021-04-15 LAB — CREATININE, SERUM
Creatinine, Ser: 0.44 mg/dL (ref 0.44–1.00)
GFR, Estimated: >60 mL/min

## 2021-04-15 LAB — D-DIMER, QUANTITATIVE: D-Dimer, Quant: 0.99 ug/mL-FEU — ABNORMAL HIGH (ref 0.00–0.50)

## 2021-04-15 LAB — TROPONIN I (HIGH SENSITIVITY)
Troponin I (High Sensitivity): <2 ng/L (ref <18)
Troponin I (High Sensitivity): <2 ng/L (ref <18)

## 2021-04-15 LAB — BRAIN NATRIURETIC PEPTIDE: B Natriuretic Peptide: 26.9 pg/mL (ref 0.0–100.0)

## 2021-04-15 MED ORDER — VITAMIN D3 25 MCG (1000 UNIT) PO TABS
5000.0000 [IU] | ORAL_TABLET | Freq: Every day | ORAL | Status: DC
  Administered 2021-04-16: 5000 [IU] via ORAL
  Filled 2021-04-15: qty 5

## 2021-04-15 MED ORDER — SODIUM CHLORIDE 0.9 % IV BOLUS
1000.0000 mL | Freq: Once | INTRAVENOUS | Status: AC
  Administered 2021-04-15: 1000 mL via INTRAVENOUS

## 2021-04-15 MED ORDER — ALPRAZOLAM 0.25 MG PO TABS: 0.2500 mg | ORAL_TABLET | Freq: Two times a day (BID) | ORAL | Status: DC | PRN

## 2021-04-15 MED ORDER — LIDOCAINE VISCOUS HCL 2 % MT SOLN
15.0000 mL | Freq: Once | OROMUCOSAL | Status: AC
  Administered 2021-04-15: 15 mL via ORAL
  Filled 2021-04-15: qty 15

## 2021-04-15 MED ORDER — ACETAMINOPHEN 325 MG PO TABS: 650.0000 mg | ORAL_TABLET | Freq: Four times a day (QID) | ORAL | Status: DC | PRN

## 2021-04-15 MED ORDER — ACETAMINOPHEN 325 MG PO TABS: 650.0000 mg | ORAL_TABLET | ORAL | Status: DC | PRN

## 2021-04-15 MED ORDER — ZOLPIDEM TARTRATE 5 MG PO TABS
5.0000 mg | ORAL_TABLET | Freq: Every evening | ORAL | Status: DC | PRN
  Administered 2021-04-15: 5 mg via ORAL
  Filled 2021-04-15: qty 1

## 2021-04-15 MED ORDER — ENOXAPARIN SODIUM 40 MG/0.4ML IJ SOSY
40.0000 mg | PREFILLED_SYRINGE | INTRAMUSCULAR | Status: DC
  Administered 2021-04-15: 40 mg via SUBCUTANEOUS
  Filled 2021-04-15: qty 0.4

## 2021-04-15 MED ORDER — HYDROCODONE-ACETAMINOPHEN 7.5-325 MG PO TABS
1.0000 | ORAL_TABLET | Freq: Four times a day (QID) | ORAL | Status: DC | PRN
  Administered 2021-04-15 – 2021-04-16 (×3): 1 via ORAL
  Filled 2021-04-15 (×3): qty 1

## 2021-04-15 MED ORDER — ALUM & MAG HYDROXIDE-SIMETH 200-200-20 MG/5ML PO SUSP
30.0000 mL | Freq: Once | ORAL | Status: AC
  Administered 2021-04-15: 30 mL via ORAL
  Filled 2021-04-15: qty 30

## 2021-04-15 MED ORDER — ONDANSETRON HCL 4 MG/2ML IJ SOLN: 4.0000 mg | Freq: Four times a day (QID) | INTRAMUSCULAR | Status: DC | PRN

## 2021-04-15 MED ORDER — IOHEXOL 350 MG/ML SOLN
100.0000 mL | Freq: Once | INTRAVENOUS | Status: AC | PRN
  Administered 2021-04-15: 75 mL via INTRAVENOUS

## 2021-04-15 MED ORDER — PREGABALIN 75 MG PO CAPS: 75.0000 mg | ORAL_CAPSULE | Freq: Every day | ORAL | Status: DC

## 2021-04-15 MED ORDER — NITROGLYCERIN 0.4 MG SL SUBL
0.4000 mg | SUBLINGUAL_TABLET | SUBLINGUAL | Status: DC | PRN
  Administered 2021-04-15: 0.4 mg via SUBLINGUAL
  Filled 2021-04-15: qty 1

## 2021-04-15 MED ORDER — ROSUVASTATIN CALCIUM 10 MG PO TABS
10.0000 mg | ORAL_TABLET | Freq: Every day | ORAL | Status: DC
  Administered 2021-04-15 – 2021-04-16 (×2): 10 mg via ORAL
  Filled 2021-04-15 (×2): qty 1

## 2021-04-15 MED ORDER — PREGABALIN 75 MG PO CAPS
75.0000 mg | ORAL_CAPSULE | Freq: Every day | ORAL | Status: DC
  Administered 2021-04-15: 75 mg via ORAL
  Filled 2021-04-15: qty 1

## 2021-04-15 NOTE — ED Provider Notes (Signed)
[facility] DEPT Provider Note   CSN: [id number] Arrival date & time: 04/15/21  1275     History Chief Complaint  Patient presents with   Chest Pain    [name] is a 70 y.o. female.  The history is provided by the patient and medical records. No language interpreter was used.  Chest Pain  70 year old female significant history hypertension, prediabetes, remote history of blood clot presenting today with complaints of chest pain.  Patient report this morning that she noticed pain to her chest.  Pain is primarily to the mid and left chest radiates to her left arm described as a sharp stabbing burning sensation moderate in intensity.  She endorsed some mild dizziness but no associated shortness of breath or nausea.  She did take a Bayer aspirin without any significant relief.  No fever cough heavy lifting abdominal pain back pain.  Denies eating any spicy food.  Denies any recent cardiac stress test.  Denies alcohol or tobacco abuse.  Past Medical History:  Diagnosis Date   Arthritis    Cataract    Diverticulosis    H/O blood transfusion reaction 2010   hives   H/O migraine    last 22yr ago   Headache(784.0)    Heart murmur    takes Bystolic nightly   History of blood clots 487yr  was on Lovenox injections and Coumadin(was only on that for short period of time)   History of shingles [id number]   Hx of seasonal allergies    takes Levocetirizine prn   Hyperlipidemia    takes Crestor nightly   Hypertension    takes Maxzide daily   Insomnia    takes Ambien prn   Joint pain    Joint swelling    Moderate tricuspid regurgitation    Pre-diabetes    Trigeminal neuralgia of right side of face 03/26/2016   V2 distribution    Patient Active Problem List   Diagnosis Date Noted   Heart failure due to valvular disease, unspecified heart failure type ([place]/[place]/2021   Obese 12/14/2020   S/P total knee arthroplasty, 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   Moderate  tricuspid regurgitation 10/20/2019   Bunion of great toe of right foot 01/12/2019   Cellulitis 12/23/2018   Foot infection    Great toe pain, right 12/17/2018   Prediabetes 12/17/2018   Cellulitis of foot 08/23/2017   Hypokalemia 08/23/2017   Trigeminal neuralgia of right side of face 03/26/2016   Hyperlipidemia 12/04/2013   Hypertension 12/04/2013   Constipation 12/04/2013   Insomnia 12/04/2013   Acute blood loss anemia 12/04/2013   Left hip pain 12/04/2013   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   Pain due to total hip replacement ([place]6/26/2013    Past Surgical History:  Procedure Laterality Date   ABDOMINAL HYSTERECTOMY     BREAST BIOPSY  05/03/2006   [place]  1987   BUNIONECTOMY Right 05/11/2019   CATARACT EXTRACTION Left 07/2019   CHOLECYSTECTOMY     COLONOSCOPY     ESOPHAGOGASTRODUODENOSCOPY     EYE SURGERY Right    eye lash removed   JOINT REPLACEMENT     bilateral hip rt in 2007 and lt in 2010   NM [place]/14/2010   Normal   REDUCTION MAMMAPLASTY Bilateral 1988   REDUCTION MAMMAPLASTY     SHOULDER ARTHROSCOPY WITH DISTAL CLAVICLE RESECTION Right 09/21/2015   Procedure: RIGHT SHOULDER ARTHROSCOPY WITH DISTAL CLAVICLE  EXCISION DEBRIDE LABRAL TEAR ACROMIOPLASTY ;  Surgeon: Frederik Pear, MD;  Location: [place];  Service: Orthopedics;  Laterality: Right;   TEE WITHOUT CARDIOVERSION N/A 04/12/2015   Procedure: TRANSESOPHAGEAL ECHOCARDIOGRAM (TEE)/BUBBLE STUDY;  Surgeon: Adrian Prows, MD;  Location: [place];  Service: Cardiovascular;  Laterality: N/A;   TOTAL HIP REVISION  04/14/2012   Procedure: TOTAL HIP REVISION;  Surgeon: Kerin Salen, MD;  Location: [place];  Service: Orthopedics;  Laterality: Right;  right total hip revision   TOTAL HIP REVISION Left 11/30/2013   Procedure: TOTAL HIP REVISION;  Surgeon: Kerin Salen, MD;  Location: [place];  Service: Orthopedics;  Laterality: Left;   TOTAL KNEE ARTHROPLASTY  Right 12/13/2020   Procedure: TOTAL KNEE ARTHROPLASTY;  Surgeon: Paralee Cancel, MD;  Location: [facility];  Service: Orthopedics;  Laterality: Right;  70 mins     OB History   No obstetric history on file.     Family History  Problem Relation Age of Onset   Heart attack Mother    Alzheimer's disease Mother    Diabetes Sister    Hypertension Sister     Social History   Tobacco Use   Smoking status: Former    Packs/day: 0.25    Years: 42.00    Pack years: 10.50    Types: Cigarettes    Quit date: 10/22/2018    Years since quitting: 2.4   Smokeless tobacco: Never  Vaping Use   Vaping Use: Never used  Substance Use Topics   Alcohol use: Not Currently    Comment: lst dirnk 2 years ago    Drug use: No    Home Medications Prior to Admission medications   Medication Sig Start Date End Date Taking? Authorizing Provider  BYSTOLIC 5 MG tablet TAKE 1 TABLET DAILY Patient taking differently: Take 5 mg by mouth daily. 09/22/20   Glendale Chard, MD  Cholecalciferol (DIALYVITE VITAMIN D 5000) 125 MCG (5000 UT) capsule Take 5,000 Units by mouth daily.    [provider]  diphenhydrAMINE (BENADRYL) 25 MG tablet Take 25 mg by mouth at bedtime as needed for itching.     [provider]  methocarbamol (ROBAXIN) 500 MG tablet Take 1 tablet (500 mg total) by mouth every 6 (six) hours as needed for muscle spasms. 12/14/20   Danae Orleans, PA-C  rosuvastatin (CRESTOR) 10 MG tablet TAKE 1 TABLET DAILY Patient taking differently: Take 10 mg by mouth daily. 05/13/20   Glendale Chard, MD  triamterene-hydrochlorothiazide (DYAZIDE) 37.5-25 MG capsule Take 1 each (1 capsule total) by mouth daily. 02/09/21   Glendale Chard, MD  zolpidem (AMBIEN) 5 MG tablet TAKE 1 TABLET AT BEDTIME AS NEEDED FOR SLEEP Patient taking differently: Take 5 mg by mouth at bedtime as needed for sleep. 10/20/20   Minette Brine, FNP    Allergies    Ibuprofen and Penicillins  Review of Systems   Review of  Systems  Cardiovascular:  Positive for chest pain.  All other systems reviewed and are negative.  Physical Exam Updated Vital Signs BP (!) 115/56   Pulse 68   Temp 97.9 F (36.6 C) (Oral)   Resp (!) 23   Ht 4' 11.5" (1.511 m)   Wt 70.3 kg   SpO2 99%   BMI 30.78 kg/m   Physical Exam Vitals and nursing note reviewed.  Constitutional:      General: She is not in acute distress.    Appearance: She is well-developed.  HENT:  Head: Atraumatic.  Eyes:     Conjunctiva/sclera: Conjunctivae normal.  Cardiovascular:     Rate and Rhythm: Normal rate and regular rhythm.  Pulmonary:     Effort: Pulmonary effort is normal.  Abdominal:     Palpations: Abdomen is soft.     Tenderness: There is no abdominal tenderness.  Musculoskeletal:     Cervical back: Neck supple.     Right lower leg: No edema.     Left lower leg: No edema.  Skin:    Findings: No rash.  Neurological:     Mental Status: She is alert. Mental status is at baseline.  Psychiatric:        Mood and Affect: Mood normal.    ED Results / Procedures / Treatments   Labs (all labs ordered are listed, but only abnormal results are displayed) Labs Reviewed  BASIC METABOLIC PANEL - Abnormal; Notable for the following components:      Result Value   BUN 26 (*)    All other components within normal limits  CBC - Abnormal; Notable for the following components:   WBC 3.9 (*)    RDW 16.2 (*)    All other components within normal limits  TROPONIN I (HIGH SENSITIVITY)  TROPONIN I (HIGH SENSITIVITY)    EKG EKG Interpretation  Date/Time:  Saturday April 15 2021 10:10:02 EDT Ventricular Rate:  59 PR Interval:  138 QRS Duration: 118 QT Interval:  405 QTC Calculation: 388 R Axis:   61 Text Interpretation: Sinus rhythm Supraventricular bigeminy Consider right atrial enlargement Nonspecific intraventricular conduction delay Low voltage, precordial leads Confirmed by Lacretia Leigh ([id number]) on 04/15/2021 11:50:37  AM  Radiology DG Chest Port 1 View  Result Date: 04/15/2021 CLINICAL DATA:  LEFT-sided chest pain radiating down LEFT arm. No history of injury. EXAM: PORTABLE CHEST 1 VIEW COMPARISON:  July 25, 2018. FINDINGS: EKG leads project over the chest. Trachea is midline. Top-normal heart size accentuated by portable technique. Hilar structures are normal. Lungs are clear.  No sign of effusion on frontal radiograph. No sign of pneumothorax. On limited assessment no acute skeletal process. IMPRESSION: No acute cardiopulmonary disease. Electronically Signed   By: Zetta Bills M.D.   On: 04/15/2021 11:25    Procedures Procedures   Medications Ordered in ED Medications  nitroGLYCERIN (NITROSTAT) SL tablet 0.4 mg (0.4 mg Sublingual Given 04/15/21 1240)  alum & mag hydroxide-simeth (MAALOX/MYLANTA) 200-200-20 MG/5ML suspension 30 mL (30 mLs Oral Given 04/15/21 1028)    And  lidocaine (XYLOCAINE) 2 % viscous mouth solution 15 mL (15 mLs Oral Given 04/15/21 1028)  sodium chloride 0.9 % bolus 1,000 mL (1,000 mLs Intravenous New Bag/Given 04/15/21 1241)    ED Course  I have reviewed the triage vital signs and the nursing notes.  Pertinent labs & imaging results that were available during my care of the patient were reviewed by me and considered in my medical decision making (see chart for details).    MDM Rules/Calculators/A&P                          BP (!) 115/56   Pulse 68   Temp 97.9 F (36.6 C) (Oral)   Resp (!) 23   Ht 4' 11.5" (1.511 m)   Wt 70.3 kg   SpO2 99%   BMI 30.78 kg/m   Final Clinical Impression(s) / ED Diagnoses Final diagnoses:  Nonspecific chest pain    Rx / DC Orders ED Discharge Orders  None      10:09 AM Patient presents with complaints of midsternal left-sided chest pain radiates to her left arm.  Pain is a bit atypical of ACS.  Patient have a hear score of 4 therefore I will obtain delta troponin.  Initial troponin unremarkable, EKG nonconcerning, chest  x-ray unremarkable.  11:56 AM Patient report no improvement with GI cocktail.  Still having some discomfort.  She is well-appearing.  We will give her nitro.  1:14 PM Report mild improvement of her CP after nitro.  Due to her soft blood pressure of 93 systolic, I would avoid opiate medication as pain relief as it can drop the blood pressure.  IVF given. She is resting comfortably but due to ongoing pain I have discussed with Dr. Zenia Resides.  Plan to admit for cardiac rule out.  Doubt PE.    1:27 PM Due to ongoing chest discomfort, and patient does have some cardiac risk factor, I appreciate consultation from [facility] hospitalist Dr. Marylyn Ishihara who agrees to evaluate patient care.   Domenic Moras, PA-C 04/15/21 1329    Lacretia Leigh, MD 04/18/21 1048

## 2021-04-15 NOTE — H&P (Signed)
History and Physical    [name] [id number]:[id number] DOB: 1951/07/24 DOA: 04/15/2021  PCP: Glendale Chard, MD  Patient coming from: Home  Chief Complaint: left side chest pain  HPI: [name] is a 70 y.o. female with medical history significant of HTN, HLD, HFpEF. Presenting with left side chest pain. Her symptoms began this morning as she was getting out of bed. She felt a sharp, stabbing pain in her left chest. It radiated down the left arm and into her breast. It has been constant. She had some lightheadedness, but no syncopal episodes. She had no dyspnea, N/V, diaphoresis or palpitations. She tried taking a baby aspirin, but it didn't help her symptoms. So she decided to come to the ED. She denies any other aggravating or alleviating factors.    ED Course: Initial trp was negative. EKG showed sinus brady w/o st elevations. She was given NTG and GI cocktail w/o resolution of her symptoms. TRH was called for admission.   Review of Systems: Review of systems is otherwise negative for all not mentioned in HPI.   PMHx Past Medical History:  Diagnosis Date   Arthritis    Cataract    Diverticulosis    H/O blood transfusion reaction 2010   hives   H/O migraine    last 80yrs ago   Headache(784.0)    Heart murmur    takes Bystolic nightly   History of blood clots 29yrs   was on Lovenox injections and Coumadin(was only on that for short period of time)   History of shingles 3-69yrs ago   Hx of seasonal allergies    takes Levocetirizine prn   Hyperlipidemia    takes Crestor nightly   Hypertension    takes Maxzide daily   Insomnia    takes Ambien prn   Joint pain    Joint swelling    Moderate tricuspid regurgitation    Pre-diabetes    Trigeminal neuralgia of right side of face 03/26/2016   V2 distribution    PSHx Past Surgical History:  Procedure Laterality Date   ABDOMINAL HYSTERECTOMY     BREAST BIOPSY  05/03/2006   US   BREAST REDUCTION SURGERY  1987   BUNIONECTOMY  Right 05/11/2019   CATARACT EXTRACTION Left 07/2019   CHOLECYSTECTOMY     COLONOSCOPY     ESOPHAGOGASTRODUODENOSCOPY     EYE SURGERY Right    eye lash removed   JOINT REPLACEMENT     bilateral hip rt in 2007 and lt in 2010   [place]  11/04/2008   Normal   REDUCTION MAMMAPLASTY Bilateral [place] ARTHROSCOPY WITH DISTAL CLAVICLE RESECTION Right 09/21/2015   Procedure: RIGHT SHOULDER ARTHROSCOPY WITH DISTAL CLAVICLE EXCISION DEBRIDE LABRAL TEAR ACROMIOPLASTY ;  Surgeon: Frederik Pear, MD;  Location: [facility];  Service: Orthopedics;  Laterality: Right;   TEE WITHOUT CARDIOVERSION N/A 04/12/2015   Procedure: TRANSESOPHAGEAL ECHOCARDIOGRAM (TEE)/BUBBLE STUDY;  Surgeon: Adrian Prows, MD;  Location: [facility];  Service: Cardiovascular;  Laterality: N/A;   TOTAL HIP REVISION  04/14/2012   Procedure: TOTAL HIP REVISION;  Surgeon: Kerin Salen, MD;  Location: [facility];  Service: Orthopedics;  Laterality: Right;  right total hip revision   TOTAL HIP REVISION Left 11/30/2013   Procedure: TOTAL HIP REVISION;  Surgeon: Kerin Salen, MD;  Location: [facility];  Service: Orthopedics;  Laterality: Left;   TOTAL KNEE ARTHROPLASTY Right 12/13/2020   Procedure: TOTAL  KNEE ARTHROPLASTY;  Surgeon: Paralee Cancel, MD;  Location: [facility];  Service: Orthopedics;  Laterality: Right;  70 mins    SocHx  reports that she quit smoking about 2 years ago. Her smoking use included cigarettes. She has a 10.50 pack-year smoking history. She has never used smokeless tobacco. She reports previous alcohol use. She reports that she does not use drugs.  Allergies  Allergen Reactions   Ibuprofen Hives and Swelling   Penicillins Hives and Swelling    Has patient had a PCN reaction causing immediate rash, facial/tongue/throat swelling, SOB or lightheadedness with hypotension: Yes Has patient had a PCN reaction causing severe rash involving mucus membranes or skin necrosis:  No Has patient had a PCN reaction that required hospitalization: No Has patient had a PCN reaction occurring within the last 10 years: No If all of the above answers are "NO", then may proceed with Cephalosporin use. Tolerated Cephalosporin Date: 12/13/20.      FamHx Family History  Problem Relation Age of Onset   Heart attack Mother    Alzheimer's disease Mother    Diabetes Sister    Hypertension Sister     Prior to Admission medications   Medication Sig Start Date End Date Taking? Authorizing Provider  BYSTOLIC 5 MG tablet TAKE 1 TABLET DAILY Patient taking differently: Take 5 mg by mouth daily. 09/22/20   Glendale Chard, MD  Cholecalciferol (DIALYVITE VITAMIN D 5000) 125 MCG (5000 UT) capsule Take 5,000 Units by mouth daily.    [provider]  diphenhydrAMINE (BENADRYL) 25 MG tablet Take 25 mg by mouth at bedtime as needed for itching.     [provider]  methocarbamol (ROBAXIN) 500 MG tablet Take 1 tablet (500 mg total) by mouth every 6 (six) hours as needed for muscle spasms. 12/14/20   Danae Orleans, PA-C  rosuvastatin (CRESTOR) 10 MG tablet TAKE 1 TABLET DAILY Patient taking differently: Take 10 mg by mouth daily. 05/13/20   Glendale Chard, MD  triamterene-hydrochlorothiazide (DYAZIDE) 37.5-25 MG capsule Take 1 each (1 capsule total) by mouth daily. 02/09/21   Glendale Chard, MD  zolpidem (AMBIEN) 5 MG tablet TAKE 1 TABLET AT BEDTIME AS NEEDED FOR SLEEP Patient taking differently: Take 5 mg by mouth at bedtime as needed for sleep. 10/20/20   Minette Brine, FNP    Physical Exam: Vitals:   04/15/21 1030 04/15/21 1100 04/15/21 1215 04/15/21 1315  BP: 105/68 102/62 101/62 103/60  Pulse:  (!) 55 (!) 58 (!) 56  Resp:  19 17 16   Temp:  98 F (36.7 C)    TempSrc:      SpO2:  98% 97% 99%  Weight:      Height:        General: 70 y.o. female resting in bed in NAD Eyes: PERRL, normal sclera ENMT: Nares patent w/o discharge, orophaynx clear, dentition  normal, ears w/o discharge/lesions/ulcers Neck: Supple, trachea midline Cardiovascular: RRR, +S1, S2, no m/g/r, equal pulses throughout; reproducible chest pain on exam Respiratory: CTABL, no w/r/r, normal WOB GI: BS+, NDNT, no masses noted, no organomegaly noted MSK: No e/c/c Skin: No rashes, bruises, ulcerations noted Neuro: A&O x 3, no focal deficits Psyc: Appropriate interaction and affect, calm/cooperative  Labs on Admission: I have personally reviewed following labs and imaging studies  CBC: Recent Labs  Lab 04/15/21 1050  WBC 3.9*  HGB 13.2  HCT 42.2  MCV 86.7  PLT 381   Basic Metabolic Panel: Recent Labs  Lab 04/15/21 1050  NA 143  K 4.0  CL 107  CO2 28  GLUCOSE 97  BUN 26*  CREATININE 0.72  CALCIUM 9.7   GFR: Estimated Creatinine Clearance: 55.8 mL/min (by C-G formula based on SCr of 0.72 mg/dL). Liver Function Tests: No results for input(s): AST, ALT, ALKPHOS, BILITOT, PROT, ALBUMIN in the last 168 hours. No results for input(s): LIPASE, AMYLASE in the last 168 hours. No results for input(s): AMMONIA in the last 168 hours. Coagulation Profile: No results for input(s): INR, PROTIME in the last 168 hours. Cardiac Enzymes: No results for input(s): CKTOTAL, CKMB, CKMBINDEX, TROPONINI in the last 168 hours. BNP (last 3 results) No results for input(s): PROBNP in the last 8760 hours. HbA1C: No results for input(s): HGBA1C in the last 72 hours. CBG: No results for input(s): GLUCAP in the last 168 hours. Lipid Profile: No results for input(s): CHOL, HDL, LDLCALC, TRIG, CHOLHDL, LDLDIRECT in the last 72 hours. Thyroid Function Tests: No results for input(s): TSH, T4TOTAL, FREET4, T3FREE, THYROIDAB in the last 72 hours. Anemia Panel: No results for input(s): VITAMINB12, FOLATE, FERRITIN, TIBC, IRON, RETICCTPCT in the last 72 hours. Urine analysis:    Component Value Date/Time   COLORURINE YELLOW 09/17/2018 1602   APPEARANCEUR HAZY (A) 09/17/2018 1602    LABSPEC >1.046 (H) 09/17/2018 1602   PHURINE 6.0 09/17/2018 1602   GLUCOSEU NEGATIVE 09/17/2018 1602   HGBUR NEGATIVE 09/17/2018 1602   BILIRUBINUR negative 12/24/2019 1648   KETONESUR 5 (A) 09/17/2018 1602   PROTEINUR Negative 12/24/2019 1648   PROTEINUR NEGATIVE 09/17/2018 1602   UROBILINOGEN 0.2 12/24/2019 1648   UROBILINOGEN 0.2 03/03/2015 0510   NITRITE negative 12/24/2019 1648   NITRITE NEGATIVE 09/17/2018 1602   LEUKOCYTESUR Negative 12/24/2019 1648    Radiological Exams on Admission: DG Chest Port 1 View  Result Date: 04/15/2021 CLINICAL DATA:  LEFT-sided chest pain radiating down LEFT arm. No history of injury. EXAM: PORTABLE CHEST 1 VIEW COMPARISON:  July 25, 2018. FINDINGS: EKG leads project over the chest. Trachea is midline. Top-normal heart size accentuated by portable technique. Hilar structures are normal. Lungs are clear.  No sign of effusion on frontal radiograph. No sign of pneumothorax. On limited assessment no acute skeletal process. IMPRESSION: No acute cardiopulmonary disease. Electronically Signed   By: Zetta Bills M.D.   On: 04/15/2021 11:25    EKG: Independently reviewed. Sinus brady, no st elevations  Assessment/Plan Chest pain     - place in obs, tele     - initial trp negative, 2nd pending; EKG w/ sinus brady     - check d-dimer, bnp; dependent results may check CTA chest or echo     - she has reproducible CP on exam, HEART Score is 4     - rpt EKG in AM     - follows with Dr. Einar Gip; spoke with his service (Dr. Virgina Jock); if second troponin is negative, reasonable to have outpt stress test/follow up; if second is elevated, they will formally consult while she is inpt; appreciate their assistance  HLD     - continue statin  Hx of HTN Chronic HFpEF     - on bystolic, dyazide; but on hypotensive side     - hold for now  DVT prophylaxis: lovenox  Code Status: FULL  Family Communication: None at bedside  Consults called: None   Status is:  Observation  The patient remains OBS appropriate and will d/c before 2 midnights.  Dispo: The patient is from: Home  Anticipated d/c is to: Home              Patient currently is not medically stable to d/c.   Difficult to place patient No  Time spent coordinating admission: 60 minutes  [facility]  If 7PM-7AM, please contact night-coverage [email or web address]  04/15/2021, 1:35 PM

## 2021-04-15 NOTE — ED Triage Notes (Signed)
Patient reports waking up at 8 am with left side chest pain rated 7/10, says pain radiating down left arm. Denies injury

## 2021-04-16 ENCOUNTER — Encounter (HOSPITAL_COMMUNITY): Payer: Self-pay | Admitting: Internal Medicine

## 2021-04-16 DIAGNOSIS — I700 Atherosclerosis of aorta: Secondary | ICD-10-CM

## 2021-04-16 DIAGNOSIS — I10 Essential (primary) hypertension: Secondary | ICD-10-CM

## 2021-04-16 DIAGNOSIS — R0789 Other chest pain: Secondary | ICD-10-CM | POA: Diagnosis not present

## 2021-04-16 DIAGNOSIS — R079 Chest pain, unspecified: Secondary | ICD-10-CM | POA: Diagnosis not present

## 2021-04-16 HISTORY — DX: Atherosclerosis of aorta: I70.0

## 2021-04-16 LAB — CBC
HCT: 37 % (ref 36.0–46.0)
Hemoglobin: 11.6 g/dL — ABNORMAL LOW (ref 12.0–15.0)
MCH: 27.1 pg (ref 26.0–34.0)
MCHC: 31.4 g/dL (ref 30.0–36.0)
MCV: 86.4 fL (ref 80.0–100.0)
Platelets: 205 10*3/uL (ref 150–400)
RBC: 4.28 MIL/uL (ref 3.87–5.11)
RDW: 16 % — ABNORMAL HIGH (ref 11.5–15.5)
WBC: 5 10*3/uL (ref 4.0–10.5)
nRBC: 0 % (ref 0.0–0.2)

## 2021-04-16 LAB — SARS CORONAVIRUS 2 (TAT 6-24 HRS): SARS Coronavirus 2: NEGATIVE

## 2021-04-16 MED ORDER — HYDROCODONE-ACETAMINOPHEN 7.5-325 MG PO TABS: 1.0000 | ORAL_TABLET | Freq: Four times a day (QID) | ORAL | 0 refills | Status: DC | PRN

## 2021-04-16 NOTE — Assessment & Plan Note (Addendum)
--   Some mild lightheadedness.  Borderline low blood pressure.  Will continue Bystolic, triamterene hydrochlorothiazide on discharge.  Defer changes in management to Dr. Einar Gip.

## 2021-04-16 NOTE — Plan of Care (Signed)
Pt to be discharged home with self care. Pt has transportation arranged to home. Pt discharge education completed with pt and pt verbalizes understanding and cooperation. Pt dressed in home clothing prior to discharge.  Problem: Education: Goal: Knowledge of General Education information will improve Description: Including pain rating scale, medication(s)/side effects and non-pharmacologic comfort measures Outcome: Adequate for Discharge   Problem: Health Behavior/Discharge Planning: Goal: Ability to manage health-related needs will improve Outcome: Adequate for Discharge   Problem: Clinical Measurements: Goal: Ability to maintain clinical measurements within normal limits will improve Outcome: Adequate for Discharge Goal: Will remain free from infection Outcome: Adequate for Discharge Goal: Diagnostic test results will improve Outcome: Adequate for Discharge Goal: Respiratory complications will improve Outcome: Adequate for Discharge Goal: Cardiovascular complication will be avoided Outcome: Adequate for Discharge   Problem: Activity: Goal: Risk for activity intolerance will decrease Outcome: Adequate for Discharge   Problem: Nutrition: Goal: Adequate nutrition will be maintained Outcome: Adequate for Discharge   Problem: Coping: Goal: Level of anxiety will decrease Outcome: Adequate for Discharge   Problem: Elimination: Goal: Will not experience complications related to bowel motility Outcome: Adequate for Discharge Goal: Will not experience complications related to urinary retention Outcome: Adequate for Discharge   Problem: Pain Managment: Goal: General experience of comfort will improve Outcome: Adequate for Discharge   Problem: Safety: Goal: Ability to remain free from injury will improve Outcome: Adequate for Discharge   Problem: Skin Integrity: Goal: Risk for impaired skin integrity will decrease Outcome: Adequate for Discharge

## 2021-04-16 NOTE — Assessment & Plan Note (Signed)
--   LDL quite low.  Continue statin.

## 2021-04-16 NOTE — Discharge Summary (Signed)
Physician Discharge Summary  [name] [id number]:[id number] DOB: 12/15/1950 DOA: 04/15/2021  PCP: Glendale Chard, MD  Admit date: 04/15/2021 Discharge date: 04/16/2021  Recommendations for Outpatient Follow-up:  Follow-up atypical chest pain.    Follow-up Information     Adrian Prows, MD. Schedule an appointment as soon as possible for a visit in 1 day(s).   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 Yes; Fluid consistency: Thin  Diet effective now       Question Answer Comment  Room service appropriate? Yes   Fluid consistency: Thin      04/15/21 1459             Filed Weights   04/15/21 0948  Weight: 70.3 kg    HPI/Hospital Course:   70 year old woman PMH including chronic diastolic CHF, hypertension, hyperlipidemia presenting with left-sided chest pain described as stabbing.  Some lightheadedness.  Took aspirin without relief.  In the emergency department initial work-up was unrevealing including negative troponin x2, normal BNP and EKG without acute changes. Exam revealed reproducible chest wall pain.  Admitting physician discussed with Dr. Einar Gip who recommended outpatient follow-up for stress test if second troponin was negative.  No new issues overnight.  Telemetry unrevealing.  Exam benign today with reproducible chest wall pain.  She has had some atypical pain now for more than  24 hours with negative enzymes and unremarkable EKG.  CTA chest unremarkable.  Suspect musculoskeletal etiology.  No signs or symptoms to suggest ACS.  Discharged home in good condition with close outpatient follow-up with Dr. Einar Gip, I have sent a message requesting follow-up.  * Chest pain -- Work-up unrevealing as above.  Can recommend baby aspirin to her which she had not been taking at home except for yesterday.  She will call Dr. Irven Shelling office tomorrow to arrange follow-up.  Consult on reasons to return including worsening chest pain.  Benign essential HTN -- Some mild lightheadedness.  Borderline low blood pressure.  Will continue Bystolic, triamterene hydrochlorothiazide on discharge.  Defer changes in management to Dr. Einar Gip.  Hyperlipidemia -- LDL quite low.  Continue statin.  Aortic atherosclerosis ([facility]) --statin  Today's assessment: S: CC: f/u chest pain  Feels overall better today still has some chest discomfort, notices left upper sternal border and exacerbated by touch.  Not otherwise positional.  Denies injury.  No shortness of breath.  O: Vitals:  Vitals:   04/16/21 0129 04/16/21 0532  BP: (!) 88/53 98/61  Pulse: (!) 59 (!) 58  Resp:  18  Temp: 98.2 F (36.8 C) 98.1 F (36.7 C)  SpO2: 100% 100%    Physical Exam Vitals and nursing note reviewed.  Constitutional:      General: She is not in acute distress.    Appearance: Normal appearance.  Cardiovascular:     Rate  and Rhythm: Normal rate and regular rhythm.     Heart sounds: No murmur heard. Pulmonary:     Effort: Pulmonary effort is normal. No respiratory distress.     Breath sounds: Normal breath sounds. No wheezing or rales.  Abdominal:     Palpations: Abdomen is soft.  Musculoskeletal:     Comments: Reproducible pain LUSB with palpation, chest wall notable for keloid, no other abnormalities.  Neurological:     Mental Status: She is alert.  Psychiatric:        Mood and Affect: Mood normal.         Behavior: Behavior normal.   Basic metabolic panel, troponins, BNP unremarkable.  LDL 29.  CBC stable hemoglobin 11.6. Chest x-ray independently reviewed no acute disease EKG independently reviewed sinus rhythm, nonspecific ST changes no significant change since last test 10/02/2020.  CT chest no PE, no acute intrathoracic process.  Aortic atherosclerosis.  Discharge Instructions  Discharge Instructions     Diet - low sodium heart healthy   Complete by: As directed    Discharge instructions   Complete by: As directed    Call your physician or seek immediate medical attention for pain, shortness of breath, nausea, vomiting, weakness or worsening of condition.   Increase activity slowly   Complete by: As directed       Allergies as of 04/16/2021       Reactions   Ibuprofen Hives, Swelling   Penicillins Hives, Swelling   Has patient had a PCN reaction causing immediate rash, facial/tongue/throat swelling, SOB or lightheadedness with hypotension: Yes Has patient had a PCN reaction causing severe rash involving mucus membranes or skin necrosis: No Has patient had a PCN reaction that required hospitalization: No Has patient had a PCN reaction occurring within the last 10 years: No If all of the above answers are "NO", then may proceed with Cephalosporin use. Tolerated Cephalosporin Date: 12/13/20.        Medication List     STOP taking these medications    methocarbamol 500 MG tablet Commonly known as: Robaxin       TAKE these medications    acetaminophen 325 MG tablet Commonly known as: TYLENOL Take 650 mg by mouth every 6 (six) hours as needed for headache.   Bystolic 5 MG tablet Generic drug: nebivolol TAKE 1 TABLET DAILY What changed: how much to take   Dialyvite Vitamin D 5000 125 MCG (5000 UT) capsule Generic drug: Cholecalciferol Take 5,000 Units by mouth daily.   HYDROcodone-acetaminophen 7.5-325 MG tablet Commonly known as: NORCO Take 1 tablet by  mouth every 6 (six) hours as needed for moderate pain.   pregabalin 75 MG capsule Commonly known as: LYRICA Take 75 mg by mouth daily.   rosuvastatin 10 MG tablet Commonly known as: CRESTOR TAKE 1 TABLET DAILY   triamterene-hydrochlorothiazide 37.5-25 MG capsule Commonly known as: DYAZIDE Take 1 each (1 capsule total) by mouth daily.   zolpidem 5 MG tablet Commonly known as: AMBIEN TAKE 1 TABLET AT BEDTIME AS NEEDED FOR SLEEP What changed:  reasons to take this additional instructions       Allergies  Allergen Reactions   Ibuprofen Hives and Swelling   Penicillins Hives and Swelling    Has patient had a PCN reaction causing immediate rash, facial/tongue/throat swelling, SOB or lightheadedness with hypotension: Yes Has patient had a PCN reaction causing severe rash involving mucus membranes or skin necrosis: No Has patient had a PCN reaction that required hospitalization: No Has patient  had a PCN reaction occurring within the last 10 years: No If all of the above answers are "NO", then may proceed with Cephalosporin use. Tolerated Cephalosporin Date: 12/13/20.      The results of significant diagnostics from this hospitalization (including imaging, microbiology, ancillary and laboratory) are listed below for reference.    Significant Diagnostic Studies: CT Angio Chest Pulmonary Embolism (PE) W or WO Contrast  Result Date: 04/15/2021 CLINICAL DATA:  Left-sided chest pain and elevated D-dimer. EXAM: CT ANGIOGRAPHY CHEST WITH CONTRAST TECHNIQUE: Multidetector CT imaging of the chest was performed using the standard protocol during bolus administration of intravenous contrast. Multiplanar CT image reconstructions and MIPs were obtained to evaluate the vascular anatomy. CONTRAST:  61mL OMNIPAQUE IOHEXOL 350 MG/ML SOLN COMPARISON:  Chest x-ray from same day. CTA chest dated July 26, 2018. FINDINGS: Cardiovascular: Satisfactory opacification of the pulmonary arteries to the  segmental level. No evidence of pulmonary embolism. Borderline cardiomegaly. No pericardial effusion. No thoracic aortic aneurysm or dissection. Coronary, aortic arch, and branch vessel atherosclerotic vascular disease. Mediastinum/Nodes: No enlarged mediastinal, hilar, or axillary lymph nodes. Thyroid gland, trachea, and esophagus demonstrate no significant findings. Lungs/Pleura: Lungs are clear. No pleural effusion or pneumothorax. Upper Abdomen: No acute abnormality. Musculoskeletal: No chest wall abnormality. No acute or significant osseous findings. Review of the MIP images confirms the above findings. IMPRESSION: 1. No evidence of pulmonary embolism. No acute intrathoracic process. 2.  Aortic atherosclerosis (ICD10-I70.0). Electronically Signed   By: Titus Dubin M.D.   On: 04/15/2021 14:48   DG Chest Port 1 View  Result Date: 04/15/2021 CLINICAL DATA:  LEFT-sided chest pain radiating down LEFT arm. No history of injury. EXAM: PORTABLE CHEST 1 VIEW COMPARISON:  July 25, 2018. FINDINGS: EKG leads project over the chest. Trachea is midline. Top-normal heart size accentuated by portable technique. Hilar structures are normal. Lungs are clear.  No sign of effusion on frontal radiograph. No sign of pneumothorax. On limited assessment no acute skeletal process. IMPRESSION: No acute cardiopulmonary disease. Electronically Signed   By: Zetta Bills M.D.   On: 04/15/2021 11:25    Microbiology: Recent Results (from the past 240 hour(s))  SARS CORONAVIRUS 2 (TAT 6-24 HRS) Nasopharyngeal Nasopharyngeal Swab     Status: None   Collection Time: 04/15/21  2:31 PM   Specimen: Nasopharyngeal Swab  Result Value Ref Range Status   SARS Coronavirus 2 NEGATIVE NEGATIVE Final    Comment: (NOTE) SARS-CoV-2 target nucleic acids are NOT DETECTED.  The SARS-CoV-2 RNA is generally detectable in upper and lower respiratory specimens during the acute phase of infection. Negative results do not preclude SARS-CoV-2  infection, do not rule out co-infections with other pathogens, and should not be used as the sole basis for treatment or other patient management decisions. Negative results must be combined with clinical observations, patient history, and epidemiological information. The expected result is Negative.  Fact Sheet for Patients: SugarRoll.be  Fact Sheet for Healthcare Providers: https://www.woods-mathews.com/  This test is not yet approved or cleared by the [place] FDA and  has been authorized for detection and/or diagnosis of SARS-CoV-2 by FDA under an Emergency Use Authorization (EUA). This EUA will remain  in effect (meaning this test can be used) for the duration of the COVID-19 declaration under Se ction 564(b)(1) of the Act, 21 U.S.C. section 360bbb-3(b)(1), unless the authorization is terminated or revoked sooner.  Performed at [facility], [place] [address] [place], [place] [address]      Labs: Basic Metabolic  Panel: Recent Labs  Lab 04/15/21 1050 04/15/21 1500  NA 143  --   K 4.0  --   CL 107  --   CO2 28  --   GLUCOSE 97  --   BUN 26*  --   CREATININE 0.72 0.44  CALCIUM 9.7  --    CBC: Recent Labs  Lab 04/15/21 1050 04/15/21 1500 04/16/21 0754  WBC 3.9* 3.6* 5.0  HGB 13.2 9.3* 11.6*  HCT 42.2 29.9* 37.0  MCV 86.7 87.4 86.4  PLT 237 160 205   Recent Labs    04/15/21 1353  BNP 26.9    Principal Problem:   Chest pain Active Problems:   Hyperlipidemia   Benign essential HTN   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

## 2021-04-16 NOTE — Assessment & Plan Note (Signed)
statin

## 2021-04-16 NOTE — Assessment & Plan Note (Signed)
--   Work-up unrevealing as above.  Can recommend baby aspirin to her which she had not been taking at home except for yesterday.  She will call Dr. Irven Shelling office tomorrow to arrange follow-up.  Consult on reasons to return including worsening chest pain.

## 2021-04-17 ENCOUNTER — Telehealth: Payer: Self-pay

## 2021-04-17 NOTE — Telephone Encounter (Signed)
Transition Care Management Follow-up Telephone Call Date of discharge and from where: [facility]   How have you been since you were released from the hospital? Doing better,  Any questions or concerns? No  Items Reviewed: Did the pt receive and understand the discharge instructions provided? Yes  Medications obtained and verified? Yes  Other? Yes  Any new allergies since your discharge? No  Dietary orders reviewed? Yes Do you have support at home? Yes   Home Care and Equipment/Supplies: Were home health services ordered? not applicable If so, what is the name of the agency? N/a  Has the agency set up a time to come to the patient's home? not applicable Were any new equipment or medical supplies ordered?  No What is the name of the medical supply agency? N/a Were you able to get the supplies/equipment? not applicable Do you have any questions related to the use of the equipment or supplies? No  Functional Questionnaire: (I = Independent and D = Dependent) ADLs: I  Bathing/Dressing- I  Meal Prep- I  Eating- I  Maintaining continence- I  Transferring/Ambulation- I  Managing Meds- I  Follow up appointments reviewed:  PCP Hospital f/u appt confirmed? Yes  Scheduled to see Minette Brine on 04/19/2021 @ 4:00pm. [facility] f/u appt confirmed? No  Scheduled to see n/a on n/a @ n/a. Are transportation arrangements needed? No  If their condition worsens, is the pt aware to call PCP or go to the Emergency Dept.? Yes Was the patient provided with contact information for the PCP's office or ED? Yes Was to pt encouraged to call back with questions or concerns? Yes

## 2021-04-19 ENCOUNTER — Inpatient Hospital Stay: Payer: Medicare Other | Admitting: Nurse Practitioner

## 2021-04-25 ENCOUNTER — Encounter: Payer: Self-pay | Admitting: Student

## 2021-04-25 ENCOUNTER — Other Ambulatory Visit: Payer: Self-pay

## 2021-04-25 ENCOUNTER — Ambulatory Visit: Payer: Medicare Other | Admitting: Student

## 2021-04-25 VITALS — BP 115/69 | HR 77 | Temp 97.4°F | Resp 17 | Ht 59.5 in | Wt 155.6 lb

## 2021-04-25 DIAGNOSIS — R072 Precordial pain: Secondary | ICD-10-CM

## 2021-04-25 DIAGNOSIS — I10 Essential (primary) hypertension: Secondary | ICD-10-CM

## 2021-04-25 NOTE — Progress Notes (Signed)
Primary Physician/Referring:  Glendale Chard, MD  Patient ID: [name], female    DOB: [birth year], 70 y.o.   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 African-American female with  abnormal EKG suggesting cor pulmonale, mild pulmonary hypertension, essential hypertension, hyperlipidemia, prediabetes mellitus.   Patient was admitted to the hospital 04/15/2021 - 04/16/2021 with atypical chest pain.  Serial troponins were negative, BNP normal, EKG without acute changes, CTA of the chest unremarkable, and chest wall pain was reproducible on exam.  As suspected patient had musculoskeletal etiology of chest pain.  Blood pressure was low during hospitalization and patient was dizzy, therefore her Bystolic has been discontinued since hospitalization.  Patient now presents for follow-up.  She has had no chest pain since 04/15/2021.  She walks on a regular basis without issue.  She also notes that prior to hospitalization she had been doing more lifting and physical activity out of the ordinary for her which he suspects as the cause of her musculoskeletal chest pain.  Since holding Bystolic at discharge patient has had no recurrence of dizziness and she has returned to normal activity since discharge from the hospital.  Past Medical History:  Diagnosis Date   Aortic atherosclerosis ([place]) 04/16/2021   Arthritis    Cataract    Diverticulosis    H/O blood transfusion reaction 2010   hives   H/O migraine    last 1yr ago   Headache(784.0)    Heart murmur    takes Bystolic nightly   History of blood clots 428yr  was on Lovenox injections and Coumadin(was only on that for short period of time)   History of shingles 3-4y18yrgo   Hx of seasonal allergies    takes Levocetirizine prn   Hyperlipidemia    takes Crestor nightly   Hypertension    takes  Maxzide daily   Insomnia    takes Ambien prn   Joint pain    Joint swelling    Moderate tricuspid regurgitation    Pre-diabetes    Trigeminal neuralgia of right side of face 03/26/2016   V2 distribution   Past Surgical History:  Procedure Laterality Date   ABDOMINAL HYSTERECTOMY     BREAST BIOPSY  05/03/2006   [place]   BUNIONECTOMY Right 05/11/2019   CATARACT EXTRACTION Left 07/2019   CHOLECYSTECTOMY     COLONOSCOPY     ESOPHAGOGASTRODUODENOSCOPY     EYE SURGERY Right    eye lash removed   JOINT REPLACEMENT     bilateral hip rt in 2007 and lt in 2010   NM [place]/14/2010   Normal   REDUCTION MAMMAPLASTY Bilateral 198[place] WITH DISTAL CLAVICLE RESECTION Right 09/21/2015   Procedure: RIGHT SHOULDER ARTHROSCOPY WITH DISTAL CLAVICLE EXCISION DEBRIDE LABRAL TEAR ACROMIOPLASTY ;  Surgeon: FraFrederik PearD;  [facility]  Laterality: Right;   TEE WITHOUT CARDIOVERSION N/A 04/12/2015   Procedure: TRANSESOPHAGEAL ECHOCARDIOGRAM (TEE)/BUBBLE STUDY;  Surgeon: JayAdrian ProwsD;  [facility];  Laterality: N/A;   TOTAL HIP REVISION  04/14/2012   Procedure: TOTAL HIP REVISION;  Surgeon: FraKerin SalenD;  Location[facility]  Service: Orthopedics;  Laterality: Right;  right total hip revision   TOTAL HIP REVISION Left 11/30/2013   Procedure: TOTAL HIP REVISION;  Surgeon: Kerin Salen, MD;  Location: [facility];  Service: Orthopedics;  Laterality: Left;   TOTAL KNEE ARTHROPLASTY Right 12/13/2020   Procedure: TOTAL KNEE ARTHROPLASTY;  Surgeon: Paralee Cancel, MD;  [facility];  Service: Orthopedics;  Laterality: Right;  70 mins   Social History   Tobacco Use   Smoking status: Former    Packs/day: 0.25    Years: 42.00    Pack years: 10.50    Types: Cigarettes    Quit date: 10/22/2018    Years since quitting: 2.5   Smokeless  tobacco: Never  Substance Use Topics   Alcohol use: Not Currently    Comment: lst dirnk 2 years ago    Marital status: Married  ROS  Review of Systems  Respiratory:  Negative for shortness of breath.   Cardiovascular:  Negative for chest pain, palpitations, orthopnea, claudication, leg swelling and PND.  Musculoskeletal:  Positive for joint pain.  Neurological:  Negative for dizziness.  Endo/Heme/Allergies: Negative.   All other systems reviewed and are negative.  Objective  Blood pressure 115/69, pulse 77, temperature (!) 97.4 F (36.3 C), temperature source Temporal, resp. rate 17, height 4' 11.5" (1.511 m), weight 155 lb 9.6 oz (70.6 kg), SpO2 99 %.  Vitals with BMI 04/25/2021 04/16/2021 04/16/2021  Height 4' 11.5" - -  Weight 155 lbs 10 oz - -  BMI 16.10 - -  Systolic 960 98 88  Diastolic 69 61 53  Pulse 77 58 59    Physical Exam Vitals reviewed.  Constitutional:      General: She is not in acute distress.    Appearance: She is normal weight.  Cardiovascular:     Rate and Rhythm: Normal rate and regular rhythm.     Pulses:          Carotid pulses are 2+ on the right side and 2+ on the left side.      Radial pulses are 2+ on the right side and 2+ on the left side.       Popliteal pulses are 2+ on the right side and 2+ on the left side.       Dorsalis pedis pulses are 2+ on the right side and 2+ on the left side.       Posterior tibial pulses are 2+ on the right side and 2+ on the left side.     Heart sounds: S1 normal and S2 normal. No murmur heard.    Comments: No JVD.  Pulmonary:     Effort: Pulmonary effort is normal.     Breath sounds: Normal breath sounds. No wheezing, rhonchi or rales.  Musculoskeletal:     Right lower leg: No edema.     Left lower leg: No edema.   Laboratory examination:   Recent Labs    06/30/20 1508 12/06/20 1617 12/14/20 0338 04/15/21 1050 04/15/21 1500  NA 141 144 141 143  --   K 4.0 4.4 3.8 4.0  --   CL 103 103 108 107  --   CO2  _0 --   GLUCOSE 94 86 137* 97  --   BUN _1 26*  --   CREATININE 0.80 0.75 0.88 0.72 0.44  CALCIUM 9.4 9.9 8.5* 9.7  --   GFRNONAA 75 81 >60 >60 >60  GFRAA 86 93  --   --   --  estimated creatinine clearance is 55.9 mL/min (by C-G formula based on SCr of 0.44 mg/dL).  CMP Latest Ref Rng & Units 04/15/2021 04/15/2021 12/14/2020  Glucose 70 - 99 mg/dL - 97 137(H)  BUN 8 - 23 mg/dL - 26(H) 18  Creatinine 0.44 - 1.00 mg/dL 0.44 0.72 0.88  Sodium 135 - 145 mmol/L - 143 141  Potassium 3.5 - 5.1 mmol/L - 4.0 3.8  Chloride 98 - 111 mmol/L - 107 108  CO2 22 - 32 mmol/L - 28 23  Calcium 8.9 - 10.3 mg/dL - 9.7 8.5(L)  Total Protein 6.0 - 8.5 g/dL - - -  Total Bilirubin 0.0 - 1.2 mg/dL - - -  Alkaline Phos 44 - 121 IU/L - - -  AST 0 - 40 IU/L - - -  ALT 0 - 32 IU/L - - -   CBC Latest Ref Rng & Units 04/16/2021 04/15/2021 04/15/2021  WBC 4.0 - 10.5 K/uL 5.0 3.6(L) 3.9(L)  Hemoglobin 12.0 - 15.0 g/dL 11.6(L) 9.3(L) 13.2  Hematocrit 36.0 - 46.0 % 37.0 29.9(L) 42.2  Platelets 150 - 400 K/uL 205 160 237   Lipid Panel     Component Value Date/Time   CHOL 70 04/15/2021 1500   CHOL 126 12/24/2019 1218   TRIG 39 04/15/2021 1500   HDL 33 (L) 04/15/2021 1500   HDL 55 12/24/2019 1218   CHOLHDL 2.1 04/15/2021 1500   VLDL 8 04/15/2021 1500   LDLCALC 29 04/15/2021 1500   LDLCALC 58 12/24/2019 1218   HEMOGLOBIN A1C Lab Results  Component Value Date   HGBA1C 6.2 (H) 02/22/2021   MPG 128 12/07/2020   TSH No results for input(s): TSH in the last 8760 hours.  Allergies   Allergies  Allergen Reactions   Ibuprofen Hives and Swelling   Penicillins Hives and Swelling    Has patient had a PCN reaction causing immediate rash, facial/tongue/throat swelling, SOB or lightheadedness with hypotension: Yes Has patient had a PCN reaction causing severe rash involving mucus membranes or skin necrosis: No Has patient had a PCN reaction that required hospitalization: No Has patient had a PCN  reaction occurring within the last 10 years: No If all of the above answers are "NO", then may proceed with Cephalosporin use. Tolerated Cephalosporin Date: 12/13/20.        Medications Prior to Visit:   Outpatient Medications Prior to Visit  Medication Sig Dispense Refill   acetaminophen (TYLENOL) 325 MG tablet Take 650 mg by mouth every 6 (six) hours as needed for headache.     Cholecalciferol (DIALYVITE VITAMIN D 5000) 125 MCG (5000 UT) capsule Take 5,000 Units by mouth daily.     HYDROcodone-acetaminophen (NORCO) 7.5-325 MG tablet Take 1 tablet by mouth every 6 (six) hours as needed for moderate pain. 16 tablet 0   pregabalin (LYRICA) 75 MG capsule Take 75 mg by mouth daily.     rosuvastatin (CRESTOR) 10 MG tablet TAKE 1 TABLET DAILY 90 tablet 3   triamterene-hydrochlorothiazide (DYAZIDE) 37.5-25 MG capsule Take 1 each (1 capsule total) by mouth daily. 90 capsule 1   zolpidem (AMBIEN) 5 MG tablet TAKE 1 TABLET AT BEDTIME AS NEEDED FOR SLEEP 60 tablet 1   BYSTOLIC 5 MG tablet TAKE 1 TABLET DAILY 90 tablet 3   No facility-administered medications prior to visit.     Final Medications at End of Visit    Current Meds  Medication Sig   acetaminophen (TYLENOL) 325 MG tablet Take 650 mg by mouth every 6 (six)  hours as needed for headache.   Cholecalciferol (DIALYVITE VITAMIN D 5000) 125 MCG (5000 UT) capsule Take 5,000 Units by mouth daily.   HYDROcodone-acetaminophen (NORCO) 7.5-325 MG tablet Take 1 tablet by mouth every 6 (six) hours as needed for moderate pain.   pregabalin (LYRICA) 75 MG capsule Take 75 mg by mouth daily.   rosuvastatin (CRESTOR) 10 MG tablet TAKE 1 TABLET DAILY   triamterene-hydrochlorothiazide (DYAZIDE) 37.5-25 MG capsule Take 1 each (1 capsule total) by mouth daily.   zolpidem (AMBIEN) 5 MG tablet TAKE 1 TABLET AT BEDTIME AS NEEDED FOR SLEEP    Radiology:  No results found.  Cardiac Studies:   Lexiscan Myoview stress test 11/04/2008: Normal perfusion,  no evidence of ischemia, EF 80%  Coronary Angiogram 2012 No stents placed  TEE 04/11/2016:  LV: Normal. Normal EF. RV: Normal LA: Normal. Left atrial appendage: Normal without thrombus. Normal function. Inter atrial septum is intact without defect. Double contrast study negative for atrial level shunting. No late appearance of bubbles either. RA: Normal MV: Normal Trace MR. TV: Normal Trace TR AV: Normal. No AI or AS. PV: Normal. Trace PI.  Echocardiogram 09/16/2019: Left ventricle cavity is normal in size. Mild concentric hypertrophy of the left ventricle. Normal LV systolic function with EF 55%. Normal global wall motion. Doppler evidence of grade I (impaired) diastolic dysfunction, normal LAP. Left atrial cavity is moderately dilated. Mild to moderate mitral regurgitation. Moderate tricuspid regurgitation. Estimated pulmonary artery systolic pressure is 39 mmHg.  Compared to previous study on 07/04/2017, estimated PASP increased form 31 mmHg to 39 mmHg.  EKG   04/25/2021: Sinus rhythm with frequent PACs in bigeminal pattern at a rate of 74 bpm.  Normal axis.  Biatrial enlargement.  Nonspecific T wave abnormality.  Low voltage complexes.  Compared to EKG 09/29/2020, atrial bigeminy new, otherwise no significant change.  09/29/2020: sinus rhythm at a rate of 67 bpm, biatrial enlargement.  Normal axis.  Nonspecific T wave abnormality, cannot exclude anterior lateral ischemia.  Low voltage complexes.  Pattern consistent with pulmonary disease.  Compared to EKG 10/20/2019, left atrial enlargement new.  10/20/2019: Normal sinus rhythm at rate of 70 bpm, right atrial enlargement, T wave abnormality, cannot exclude anterolateral ischemia.  May represent RV strain.  Low-voltage complexes.  Pulmonary disease pattern.  No significant change from 07/29/2018.  Assessment     ICD-10-CM   1. Essential hypertension  I10 EKG 12-Lead    2. Precordial pain  R07.2 PCV MYOCARDIAL PERFUSION WITH LEXISCAN      No orders of the defined types were placed in this encounter.  Medications Discontinued During This Encounter  Medication Reason   BYSTOLIC 5 MG tablet Error   Recommendations:    [name]  is a 70 y.o.  African-American female with  abnormal EKG suggesting cor pulmonale, mild pulmonary hypertension, essential hypertension, hyperlipidemia, prediabetes mellitus. She quit smoking Jan 2020.    Patient was admitted to the hospital 04/15/2021 - 04/16/2021 with atypical chest pain.  Serial troponins were negative, BNP normal, EKG without acute changes, CTA of the chest unremarkable, and chest wall pain was reproducible on exam.  As suspected patient had musculoskeletal etiology of chest pain.  Blood pressure was low during hospitalization and patient was dizzy, therefore her Bystolic has been discontinued since hospitalization.  Patient now presents for follow-up.  Patient has had no recurrence of chest pain since hospitalization.  However given multiple cardiovascular risk factors will obtain Lexiscan nuclear stress test.  Patient is unable  to walk on treadmill due to knee pain and recent knee replacement.  Patient's blood pressure remains soft, however she has had no recurrence of dizziness since discontinuing Bystolic.  We will not make changes to antihypertensive medications at this time as she is relatively stable, however in the future she may benefit from switching side to beta-blocker therapy for cardiovascular benefit.  Advised patient to increase her physical activity as tolerated.  Reviewed hospital records and reassured patient that chest pain was likely musculoskeletal in nature.  If nuclear stress test is without significant abnormalities, we will plan to follow-up as previously scheduled in about 6 months.  Follow-up in 6 months, sooner if needed, for mild pulmonary hypertension, valvular heart disease, hypertension, upper lipidemia.   Alethia Berthold, PA-C 04/25/2021, 12:05  PM Office: [phone]

## 2021-05-10 DIAGNOSIS — Z96651 Presence of right artificial knee joint: Secondary | ICD-10-CM | POA: Diagnosis not present

## 2021-05-11 ENCOUNTER — Other Ambulatory Visit: Payer: Self-pay | Admitting: Internal Medicine

## 2021-05-17 ENCOUNTER — Other Ambulatory Visit: Payer: Medicare Other

## 2021-05-22 ENCOUNTER — Other Ambulatory Visit: Payer: Self-pay

## 2021-05-22 ENCOUNTER — Ambulatory Visit: Payer: Medicare Other

## 2021-05-22 DIAGNOSIS — R072 Precordial pain: Secondary | ICD-10-CM | POA: Diagnosis not present

## 2021-06-28 ENCOUNTER — Ambulatory Visit (INDEPENDENT_AMBULATORY_CARE_PROVIDER_SITE_OTHER): Payer: Medicare Other | Admitting: Internal Medicine

## 2021-06-28 ENCOUNTER — Encounter: Payer: Self-pay | Admitting: Internal Medicine

## 2021-06-28 ENCOUNTER — Other Ambulatory Visit: Payer: Self-pay

## 2021-06-28 VITALS — BP 136/78 | HR 89 | Temp 98.0°F | Ht 59.5 in | Wt 154.8 lb

## 2021-06-28 DIAGNOSIS — Z23 Encounter for immunization: Secondary | ICD-10-CM

## 2021-06-28 DIAGNOSIS — I700 Atherosclerosis of aorta: Secondary | ICD-10-CM | POA: Diagnosis not present

## 2021-06-28 DIAGNOSIS — R7303 Prediabetes: Secondary | ICD-10-CM

## 2021-06-28 DIAGNOSIS — L853 Xerosis cutis: Secondary | ICD-10-CM | POA: Diagnosis not present

## 2021-06-28 DIAGNOSIS — I10 Essential (primary) hypertension: Secondary | ICD-10-CM

## 2021-06-28 MED ORDER — TRIAMTERENE-HCTZ 37.5-25 MG PO TABS: ORAL_TABLET | ORAL | 1 refills | Status: DC

## 2021-06-28 MED ORDER — SHINGRIX 50 MCG/0.5ML IM SUSR: 0.5000 mL | Freq: Once | INTRAMUSCULAR | 0 refills | Status: AC

## 2021-06-28 MED ORDER — METOPROLOL SUCCINATE ER 25 MG PO TB24: ORAL_TABLET | ORAL | 1 refills | Status: DC

## 2021-06-28 NOTE — Patient Instructions (Signed)

## 2021-06-28 NOTE — Progress Notes (Signed)
I,Katawbba Wiggins,acting as a Education administrator for [name], MD.,have documented all relevant documentation on the behalf of [name], MD,as directed by  [name], MD while in the presence of [name], MD.  This visit occurred during the SARS-CoV-2 public health emergency.  Safety protocols were in place, including screening questions prior to the visit, additional usage of staff PPE, and extensive cleaning of exam room while observing appropriate contact time as indicated for disinfecting solutions.  Subjective:     Patient ID: [name] , female    DOB: [birth year] , 70 y.o.   MRN: [id number]   Chief Complaint  Patient presents with   Prediabetes    HPI  The patient is here to day for a pre-diabetes and HTN follow-up.  She reports compliance with meds. Denies chest pain, shortness of breath and palpitations.   Hypertension This is a chronic problem. The current episode started more than 1 year ago. The problem has been gradually improving since onset. The problem is controlled. Pertinent negatives include no blurred vision or headaches. Risk factors for coronary artery disease include dyslipidemia, post-menopausal state, sedentary lifestyle and stress. Past treatments include beta blockers and diuretics. The current treatment provides moderate improvement.    Past Medical History:  Diagnosis Date   Aortic atherosclerosis ([place]) 04/16/2021   Arthritis    Cataract    Diverticulosis    H/O blood transfusion reaction 2010   hives   H/O migraine    last 57yr ago   Headache(784.0)    Heart murmur    takes Bystolic nightly   History of blood clots 439yr  was on Lovenox injections and Coumadin(was only on that for short period of time)   History of shingles 3-4y80yrgo   Hx of seasonal allergies    takes Levocetirizine prn   Hyperlipidemia    takes Crestor nightly   Hypertension    takes Maxzide daily   Insomnia    takes Ambien prn   Joint pain    Joint  swelling    Moderate tricuspid regurgitation    Pre-diabetes    Trigeminal neuralgia of right side of face 03/26/2016   V2 distribution     Family History  Problem Relation Age of Onset   Heart attack Mother    Alzheimer's disease Mother    Diabetes Sister    Hypertension Sister      Current Outpatient Medications:    acetaminophen (TYLENOL) 325 MG tablet, Take 650 mg by mouth every 6 (six) hours as needed for headache., Disp: , Rfl:    Cholecalciferol (DIALYVITE VITAMIN D 5000) 125 MCG (5000 UT) capsule, Take 5,000 Units by mouth daily., Disp: , Rfl:    gabapentin (NEURONTIN) 100 MG capsule, Take 100 mg by mouth as needed., Disp: , Rfl:    metoprolol succinate (TOPROL XL) 25 MG 24 hr tablet, 1/2 tab po qpm until further instructions, Disp: 30 tablet, Rfl: 1   pregabalin (LYRICA) 75 MG capsule, Take 75 mg by mouth daily., Disp: , Rfl:    rosuvastatin (CRESTOR) 10 MG tablet, TAKE 1 TABLET DAILY, Disp: 90 tablet, Rfl: 3   triamterene-hydrochlorothiazide (MAXZIDE-25) 37.5-25 MG tablet, Take 1/2 tab po qam, Disp: 30 tablet, Rfl: 1   zolpidem (AMBIEN) 5 MG tablet, TAKE 1 TABLET AT BEDTIME AS NEEDED FOR SLEEP, Disp: 60 tablet, Rfl: 1   clindamycin (CLEOCIN) 150 MG capsule, Take 1 capsule (150 mg total) by mouth 4 (four) times daily for 10 days.,  Disp: 40 capsule, Rfl: 0   doxycycline (VIBRAMYCIN) 100 MG capsule, Take 1 capsule (100 mg total) by mouth 2 (two) times daily., Disp: 20 capsule, Rfl: 0   traMADol (ULTRAM) 50 MG tablet, Take 1 tablet (50 mg total) by mouth every 6 (six) hours as needed., Disp: 20 tablet, Rfl: 0   Allergies  Allergen Reactions   Ibuprofen Hives and Swelling   Penicillins Hives and Swelling    Has patient had a PCN reaction causing immediate rash, facial/tongue/throat swelling, SOB or lightheadedness with hypotension: Yes Has patient had a PCN reaction causing severe rash involving mucus membranes or skin necrosis: No Has patient had a PCN reaction that required  hospitalization: No Has patient had a PCN reaction occurring within the last 10 years: No If all of the above answers are "NO", then may proceed with Cephalosporin use. Tolerated Cephalosporin Date: 12/13/20.       Review of Systems  Constitutional: Negative.   Eyes:  Negative for blurred vision.  Respiratory: Negative.    Cardiovascular: Negative.   Gastrointestinal: Negative.   Neurological:  Negative for headaches.  Psychiatric/Behavioral: Negative.    All other systems reviewed and are negative.   Today's Vitals   06/28/21 1421  BP: 136/78  Pulse: 89  Temp: 98 F (36.7 C)  TempSrc: Oral  Weight: 154 lb 12.8 oz (70.2 kg)  Height: 4' 11.5" (1.511 m)   Body mass index is 30.74 kg/m.   Objective:  Physical Exam Vitals and nursing note reviewed.  Constitutional:      Appearance: Normal appearance.  HENT:     Head: Normocephalic and atraumatic.     Nose:     Comments: Masked     Mouth/Throat:     Comments: Masked  Eyes:     Extraocular Movements: Extraocular movements intact.  Cardiovascular:     Rate and Rhythm: Normal rate and regular rhythm.     Heart sounds: Normal heart sounds.  Pulmonary:     Effort: Pulmonary effort is normal.     Breath sounds: Normal breath sounds.  Musculoskeletal:     Cervical back: Normal range of motion.  Skin:    General: Skin is warm.  Neurological:     General: No focal deficit present.     Mental Status: She is alert.  Psychiatric:        Mood and Affect: Mood normal.        Behavior: Behavior normal.        Assessment And Plan:     1. Prediabetes Comments: Chronic, I will check labs as listed below. She has had elevated a1c in the past. Advised to cut back on her intake of sweetened beverages, incuding diet drinks - BMP8+EGFR - Hemoglobin A1c - ALT  2. Essential hypertension Comments: Chronic, fair control. She is aware goal BP is less than 130/80.  She is encouraged to follow low sodium diet. I will check renal  function. - TSH  3. Dry skin Comments: She is encouraged to increase her water intake and intake of healthy fats including avocado, walnuts, almonds and salmon.   4. Atherosclerosis of aorta (HCC) Comments: Chronic, encouraged to comply with statin therapy and live heart healthy lifestyle.   5. Immunization due Comments: She was given high dose flu vaccine. I will also send rx Shingrix to her local pharmacy.  - Flu Vaccine QUAD High Dose(Fluad)   Patient was given opportunity to ask questions. Patient verbalized understanding of the plan and was able to  repeat key elements of the plan. All questions were answered to their satisfaction.   I, [name], MD, have reviewed all documentation for this visit. The documentation on 06/28/21 for the exam, diagnosis, procedures, and orders are all accurate and complete.   IF YOU HAVE BEEN REFERRED TO A SPECIALIST, IT MAY TAKE 1-2 WEEKS TO SCHEDULE/PROCESS THE REFERRAL. IF YOU HAVE NOT HEARD FROM US/SPECIALIST IN TWO WEEKS, PLEASE GIVE [place] A CALL AT [phone].   THE PATIENT IS ENCOURAGED TO PRACTICE SOCIAL DISTANCING DUE TO THE COVID-19 PANDEMIC.

## 2021-06-29 LAB — BMP8+EGFR
BUN/Creatinine Ratio: 23 (ref 12–28)
BUN: 16 mg/dL (ref 8–27)
CO2: 26 mmol/L (ref 20–29)
Calcium: 9.7 mg/dL (ref 8.7–10.3)
Chloride: 101 mmol/L (ref 96–106)
Creatinine, Ser: 0.7 mg/dL (ref 0.57–1.00)
Glucose: 105 mg/dL — ABNORMAL HIGH (ref 65–99)
Potassium: 3.7 mmol/L (ref 3.5–5.2)
Sodium: 140 mmol/L (ref 134–144)
eGFR: 92 mL/min/{1.73_m2} (ref >59)

## 2021-06-29 LAB — HEMOGLOBIN A1C
Est. average glucose Bld gHb Est-mCnc: 131 mg/dL
Hgb A1c MFr Bld: 6.2 % — ABNORMAL HIGH (ref 4.8–5.6)

## 2021-06-29 LAB — TSH: TSH: 1.02 u[IU]/mL (ref 0.450–4.500)

## 2021-06-29 LAB — ALT: ALT: 14 IU/L (ref 0–32)

## 2021-06-30 ENCOUNTER — Emergency Department (HOSPITAL_COMMUNITY)
Admission: EM | Admit: 2021-06-30 | Discharge: 2021-06-30 | Disposition: A | Discharge status: Home / Self Care | Payer: Medicare Other | Attending: Emergency Medicine | Admitting: Emergency Medicine

## 2021-06-30 ENCOUNTER — Encounter (HOSPITAL_COMMUNITY): Payer: Self-pay

## 2021-06-30 ENCOUNTER — Emergency Department (HOSPITAL_COMMUNITY): Payer: Medicare Other

## 2021-06-30 ENCOUNTER — Other Ambulatory Visit: Payer: Self-pay

## 2021-06-30 DIAGNOSIS — Z96651 Presence of right artificial knee joint: Secondary | ICD-10-CM | POA: Insufficient documentation

## 2021-06-30 DIAGNOSIS — Z96643 Presence of artificial hip joint, bilateral: Secondary | ICD-10-CM | POA: Insufficient documentation

## 2021-06-30 DIAGNOSIS — R2242 Localized swelling, mass and lump, left lower limb: Secondary | ICD-10-CM | POA: Diagnosis present

## 2021-06-30 DIAGNOSIS — L03116 Cellulitis of left lower limb: Secondary | ICD-10-CM | POA: Insufficient documentation

## 2021-06-30 DIAGNOSIS — M79672 Pain in left foot: Secondary | ICD-10-CM | POA: Diagnosis not present

## 2021-06-30 DIAGNOSIS — I10 Essential (primary) hypertension: Secondary | ICD-10-CM | POA: Diagnosis not present

## 2021-06-30 DIAGNOSIS — Z9013 Acquired absence of bilateral breasts and nipples: Secondary | ICD-10-CM | POA: Diagnosis not present

## 2021-06-30 DIAGNOSIS — Z87891 Personal history of nicotine dependence: Secondary | ICD-10-CM | POA: Diagnosis not present

## 2021-06-30 DIAGNOSIS — M7989 Other specified soft tissue disorders: Secondary | ICD-10-CM | POA: Diagnosis not present

## 2021-06-30 DIAGNOSIS — Z79899 Other long term (current) drug therapy: Secondary | ICD-10-CM | POA: Insufficient documentation

## 2021-06-30 MED ORDER — DOXYCYCLINE HYCLATE 100 MG PO CAPS: 100.0000 mg | ORAL_CAPSULE | Freq: Two times a day (BID) | ORAL | 0 refills | Status: DC

## 2021-06-30 NOTE — ED Provider Notes (Signed)
[facility] DEPT Provider Note   CSN: [id number] Arrival date & time: 06/30/21  1628     History Chief Complaint  Patient presents with   Foot Pain    [name] is a 70 y.o. female.  70 year old female who presents with cute onset of left foot swelling.  Swelling is to the dorsum of her foot.  Is atraumatic.  Denies any prior history of gout.  Pain characterizes dull and worse when she tries to walk.  Denies any other joint pain.  No treatment use prior to arrival      Past Medical History:  Diagnosis Date   Aortic atherosclerosis ([place]) 04/16/2021   Arthritis    Cataract    Diverticulosis    H/O blood transfusion reaction 2010   hives   H/O migraine    last 44yr ago   Headache(784.0)    Heart murmur    takes Bystolic nightly   History of blood clots 440yr  was on Lovenox injections and Coumadin(was only on that for short period of time)   History of shingles 3-4y36yrgo   Hx of seasonal allergies    takes Levocetirizine prn   Hyperlipidemia    takes Crestor nightly   Hypertension    takes Maxzide daily   Insomnia    takes Ambien prn   Joint pain    Joint swelling    Moderate tricuspid regurgitation    Pre-diabetes    Trigeminal neuralgia of right side of face 03/26/2016   V2 distribution    Patient Active Problem List   Diagnosis Date Noted   Aortic atherosclerosis ([facility]6/26/2022   Chest pain 04/15/2021   Heart failure due to valvular disease, unspecified heart failure type ([facility]CC[facility] [place]5/01/2021   Obese 12/14/2020   S/P total knee arthroplasty, 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   Moderate tricuspid regurgitation 10/20/2019   Bunion of great toe of right foot 01/12/2019   Cellulitis 12/23/2018   Foot infection    Great toe pain, right 12/17/2018   Prediabetes 12/17/2018   Cellulitis of foot 08/23/2017   Trigeminal neuralgia of right side of face 03/26/2016   Hyperlipidemia 12/04/2013   Benign essential HTN 12/04/2013   Constipation  12/04/2013   Insomnia 12/04/2013   Acute blood loss anemia 12/04/2013   Left hip pain 12/04/2013   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   Pain due to total hip replacement ([facility] [place]6/26/2013    Past Surgical History:  Procedure Laterality Date   ABDOMINAL HYSTERECTOMY     BREAST BIOPSY  05/03/2006   [place]  1987   BUNIONECTOMY Right 05/11/2019   CATARACT EXTRACTION Left 07/2019   CHOLECYSTECTOMY     COLONOSCOPY     ESOPHAGOGASTRODUODENOSCOPY     EYE SURGERY Right    eye lash removed   JOINT REPLACEMENT     bilateral hip rt in 2007 and lt in 2010   NM [place]/14/2010   Normal   REDUCTION MAMMAPLASTY Bilateral 1988   REDUCTION MAMMAPLASTY     SHOULDER ARTHROSCOPY WITH DISTAL CLAVICLE RESECTION Right 09/21/2015   Procedure: RIGHT SHOULDER ARTHROSCOPY WITH DISTAL CLAVICLE EXCISION DEBRIDE LABRAL TEAR ACROMIOPLASTY ;  Surgeon: FraFrederik PearD;  [facility];  Laterality: Right;   TEE WITHOUT CARDIOVERSION N/A 04/12/2015   Procedure: TRANSESOPHAGEAL ECHOCARDIOGRAM (TEE)/BUBBLE STUDY;  Surgeon: JayAdrian ProwsD;  [facility];  Laterality: N/A;   TOTAL  HIP REVISION  04/14/2012   Procedure: TOTAL HIP REVISION;  Surgeon: Kerin Salen, MD;  Location: [facility];  Service: Orthopedics;  Laterality: Right;  right total hip revision   TOTAL HIP REVISION Left 11/30/2013   Procedure: TOTAL HIP REVISION;  Surgeon: Kerin Salen, MD;  Location: [facility];  Service: Orthopedics;  Laterality: Left;   TOTAL KNEE ARTHROPLASTY Right 12/13/2020   Procedure: TOTAL KNEE ARTHROPLASTY;  Surgeon: Paralee Cancel, MD;  Location[facility];  Service: Orthopedics;  Laterality: Right;  70 mins     OB History   No obstetric history on file.     Family History  Problem Relation Age of Onset   Heart attack Mother    Alzheimer's disease Mother    Diabetes Sister    Hypertension Sister     Social  History   Tobacco Use   Smoking status: Former    Packs/day: 0.25    Years: 42.00    Pack years: 10.50    Types: Cigarettes    Quit date: 10/22/2018    Years since quitting: 2.6   Smokeless tobacco: Never  Vaping Use   Vaping Use: Never used  Substance Use Topics   Alcohol use: Not Currently    Comment: lst dirnk 2 years ago    Drug use: No    Home Medications Prior to Admission medications   Medication Sig Start Date End Date Taking? Authorizing Provider  acetaminophen (TYLENOL) 325 MG tablet Take 650 mg by mouth every 6 (six) hours as needed for headache.    [provider]  Cholecalciferol (DIALYVITE VITAMIN D 5000) 125 MCG (5000 UT) capsule Take 5,000 Units by mouth daily.    [provider]  gabapentin (NEURONTIN) 100 MG capsule Take 100 mg by mouth as needed.    [provider]  metoprolol succinate (TOPROL XL) 25 MG 24 hr tablet 1/2 tab po qpm until further instructions 06/28/21   [place]ale Chard, MD  pregabalin (LYRICA) 75 MG capsule Take 75 mg by mouth daily. 04/12/21   [provider]  rosuvastatin (CRESTOR) 10 MG tablet TAKE 1 TABLET DAILY 05/12/21   [place]ale Chard, MD  triamterene-hydrochlorothiazide [facility]) 37.5-25 MG tablet Take 1/2 tab po qam 06/28/21   Gl[place]ale Chard, MD  zolpidem (AMBIEN) 5 MG tablet TAKE 1 TABLET AT BEDTIME AS NEEDED FOR SLEEP 10/20/20   Minette Brine, FNP    Allergies    Ibuprofen and Penicillins  Review of Systems   Review of Systems  All other systems reviewed and are negative.  Physical Exam Updated Vital Signs BP 106/62   Pulse 83   Temp 97.8 F (36.6 C) (Oral)   Resp 19   SpO2 100%   Physical Exam Vitals and nursing note reviewed.  Constitutional:      General: She is not in acute distress.    Appearance: Normal appearance. She is well-developed. She is not toxic-appearing.  HENT:     Head: Normocephalic and atraumatic.  Eyes:     General: Lids are normal.     Conjunctiva/sclera:  Conjunctivae normal.     Pupils: Pupils are equal, round, and reactive to light.  Neck:     Thyroid: No thyroid mass.     Trachea: No tracheal deviation.  Cardiovascular:     Rate and Rhythm: Normal rate and regular rhythm.     Heart sounds: Normal heart sounds. No murmur heard.   No gallop.  Pulmonary:     Effort: Pulmonary effort is normal. No  respiratory distress.     Breath sounds: Normal breath sounds. No stridor. No decreased breath sounds, wheezing, rhonchi or rales.  Abdominal:     General: There is no distension.     Palpations: Abdomen is soft.     Tenderness: There is no abdominal tenderness. There is no rebound.  Musculoskeletal:        General: No tenderness. Normal range of motion.     Cervical back: Normal range of motion and neck supple.  Feet:     Comments: Edema and erythema noted to distal dorsum of left foot.  Neurovascular intact at the toes.  Calf is nontender.  Full range of motion at the ankle without tenderness Skin:    General: Skin is warm and dry.     Findings: No abrasion or rash.  Neurological:     Mental Status: She is alert and oriented to person, place, and time. Mental status is at baseline.     GCS: GCS eye subscore is 4. GCS verbal subscore is 5. GCS motor subscore is 6.     Cranial Nerves: Cranial nerves are intact. No cranial nerve deficit.     Sensory: No sensory deficit.     Motor: Motor function is intact.  Psychiatric:        Attention and Perception: Attention normal.        Speech: Speech normal.        Behavior: Behavior normal.    ED Results / Procedures / Treatments   Labs (all labs ordered are listed, but only abnormal results are displayed) Labs Reviewed - No data to display  EKG None  Radiology No results found.  Procedures Procedures   Medications Ordered in ED Medications - No data to display  ED Course  I have reviewed the triage vital signs and the nursing notes.  Pertinent labs & imaging results that were  available during my care of the patient were reviewed by me and considered in my medical decision making (see chart for details).    MDM Rules/Calculators/A&P                           X-ray of foot is negative.  Suspect cellulitis and will place on antibiotics Final Clinical Impression(s) / ED Diagnoses Final diagnoses:  None    Rx / DC Orders ED Discharge Orders     None        Lacretia Leigh, MD 06/30/21 2029

## 2021-06-30 NOTE — ED Provider Notes (Signed)
Emergency Medicine Provider Triage Evaluation Note  [name] , a 70 y.o. female  was evaluated in triage.  Pt complains of gradual onset, constant, achy, L foot pain that she noticed this AM. She complains of pain, swelling, and redness. No trauma. No history of same. No history of gout. Has been taking Tylenol without relief. No fevers or chills.  Review of Systems  Positive: + redness, swelling, pain to L foot Negative: - calf pain, fevers  Physical Exam  BP 106/62   Pulse 83   Temp 97.8 F (36.6 C) (Oral)   Resp 19   SpO2 100%  Gen:   Awake, no distress   Resp:  Normal effort  MSK:   Moves extremities without difficulty  Other:  Mild swelling, erythema, and increased warmth to dorsum of L foot along 2nd-4th metacarpals with TTP. 2+ DP pulse.   Medical Decision Making  Medically screening exam initiated at 5:21 PM.  Appropriate orders placed.  BLAINE PASQUARELLI was informed that the remainder of the evaluation will be completed by another provider, this initial triage assessment does not replace that evaluation, and the importance of remaining in the ED until their evaluation is complete.     Eustaquio Maize, PA-C 06/30/21 1722    Lacretia Leigh, MD 07/04/21 1321

## 2021-06-30 NOTE — ED Triage Notes (Signed)
Pt reports waking up with pain, redness, and swelling to left foot. Pt denies injury.

## 2021-07-03 ENCOUNTER — Emergency Department (HOSPITAL_COMMUNITY)
Admission: EM | Admit: 2021-07-03 | Discharge: 2021-07-03 | Disposition: A | Discharge status: Home / Self Care | Payer: Medicare Other | Attending: Emergency Medicine | Admitting: Emergency Medicine

## 2021-07-03 ENCOUNTER — Other Ambulatory Visit: Payer: Self-pay

## 2021-07-03 ENCOUNTER — Encounter (HOSPITAL_COMMUNITY): Payer: Self-pay

## 2021-07-03 DIAGNOSIS — L03116 Cellulitis of left lower limb: Secondary | ICD-10-CM | POA: Insufficient documentation

## 2021-07-03 DIAGNOSIS — M79672 Pain in left foot: Secondary | ICD-10-CM | POA: Diagnosis not present

## 2021-07-03 DIAGNOSIS — Z5321 Procedure and treatment not carried out due to patient leaving prior to being seen by health care provider: Secondary | ICD-10-CM | POA: Diagnosis not present

## 2021-07-03 DIAGNOSIS — M7989 Other specified soft tissue disorders: Secondary | ICD-10-CM | POA: Diagnosis not present

## 2021-07-03 LAB — CBC WITH DIFFERENTIAL/PLATELET
Abs Immature Granulocytes: 0.01 10*3/uL (ref 0.00–0.07)
Basophils Absolute: 0 10*3/uL (ref 0.0–0.1)
Basophils Relative: 0 %
Eosinophils Absolute: 0 10*3/uL (ref 0.0–0.5)
Eosinophils Relative: 0 %
HCT: 41.1 % (ref 36.0–46.0)
Hemoglobin: 13.1 g/dL (ref 12.0–15.0)
Immature Granulocytes: 0 %
Lymphocytes Relative: 27 %
Lymphs Abs: 1.9 10*3/uL (ref 0.7–4.0)
MCH: 27.4 pg (ref 26.0–34.0)
MCHC: 31.9 g/dL (ref 30.0–36.0)
MCV: 86 fL (ref 80.0–100.0)
Monocytes Absolute: 0.7 10*3/uL (ref 0.1–1.0)
Monocytes Relative: 10 %
Neutro Abs: 4.3 10*3/uL (ref 1.7–7.7)
Neutrophils Relative %: 63 %
Platelets: 181 10*3/uL (ref 150–400)
RBC: 4.78 MIL/uL (ref 3.87–5.11)
RDW: 14.9 % (ref 11.5–15.5)
WBC: 6.9 10*3/uL (ref 4.0–10.5)
nRBC: 0 % (ref 0.0–0.2)

## 2021-07-03 LAB — COMPREHENSIVE METABOLIC PANEL
ALT: 14 U/L (ref 0–44)
AST: 22 U/L (ref 15–41)
Albumin: 3.8 g/dL (ref 3.5–5.0)
Alkaline Phosphatase: 65 U/L (ref 38–126)
Anion gap: 9 (ref 5–15)
BUN: 13 mg/dL (ref 8–23)
CO2: 26 mmol/L (ref 22–32)
Calcium: 9.4 mg/dL (ref 8.9–10.3)
Chloride: 103 mmol/L (ref 98–111)
Creatinine, Ser: 0.73 mg/dL (ref 0.44–1.00)
GFR, Estimated: >60 mL/min (ref >60)
Glucose, Bld: 116 mg/dL — ABNORMAL HIGH (ref 70–99)
Potassium: 3.7 mmol/L (ref 3.5–5.1)
Sodium: 138 mmol/L (ref 135–145)
Total Bilirubin: 0.6 mg/dL (ref 0.3–1.2)
Total Protein: 7.5 g/dL (ref 6.5–8.1)

## 2021-07-03 LAB — LACTIC ACID, PLASMA: Lactic Acid, Venous: 1.2 mmol/L (ref 0.5–1.9)

## 2021-07-03 MED ORDER — HYDROCODONE-ACETAMINOPHEN 5-325 MG PO TABS
1.0000 | ORAL_TABLET | Freq: Once | ORAL | Status: DC
Start: 2021-07-03 — End: 2021-07-04

## 2021-07-03 NOTE — ED Triage Notes (Signed)
Pt complains of cellulitis to left foot since Thursday. Pt reports that she still has pain 10/10.

## 2021-07-03 NOTE — ED Provider Notes (Signed)
Emergency Medicine Provider Triage Evaluation Note  [name] , a 70 y.o. female  was evaluated in triage.  Pt complains of left foot pain and swelling.  Patient states she was seen in the ER few days ago.  Since then she has had more pain, more swelling, and now developing chills.  No nausea, vomiting, or generalized weakness.  No history of diabetes, recently diagnosed with prediabetes.  Did not check her sugars daily.  No trauma or injury.  She has been taking antibiotics as prescribed (doxy), last dose was this evening  Review of Systems  Positive: Foot pain and swelling Negative: fever  Physical Exam  There were no vitals taken for this visit. Gen:   Awake, no distress   Resp:  Normal effort  MSK:   Moves extremities without difficulty.  Erythema, warmth, swelling of the left foot.  Good distal sensation and cap refill. Other:  No ttp of abd  Medical Decision Making  Medically screening exam initiated at 7:58 PM.  Appropriate orders placed.  [name] was informed that the remainder of the evaluation will be completed by another provider, this initial triage assessment does not replace that evaluation, and the importance of remaining in the ED until their evaluation is complete.  labs   Franchot Heidelberg, PA-C 07/03/21 1959    Sherwood Gambler, MD 07/03/21 [phone]

## 2021-07-04 ENCOUNTER — Encounter: Payer: Self-pay | Admitting: Nurse Practitioner

## 2021-07-04 ENCOUNTER — Ambulatory Visit: Payer: Medicare Other

## 2021-07-04 ENCOUNTER — Ambulatory Visit (INDEPENDENT_AMBULATORY_CARE_PROVIDER_SITE_OTHER): Payer: Medicare Other | Admitting: Nurse Practitioner

## 2021-07-04 VITALS — BP 108/60 | HR 86 | Temp 98.1°F | Ht 59.0 in | Wt 154.0 lb

## 2021-07-04 DIAGNOSIS — M79672 Pain in left foot: Secondary | ICD-10-CM

## 2021-07-04 DIAGNOSIS — L03116 Cellulitis of left lower limb: Secondary | ICD-10-CM | POA: Diagnosis not present

## 2021-07-04 MED ORDER — TRAMADOL HCL 50 MG PO TABS: 50.0000 mg | ORAL_TABLET | Freq: Four times a day (QID) | ORAL | 0 refills | Status: AC | PRN

## 2021-07-04 MED ORDER — CLINDAMYCIN HCL 150 MG PO CAPS: 150.0000 mg | ORAL_CAPSULE | Freq: Four times a day (QID) | ORAL | 0 refills | Status: AC

## 2021-07-04 NOTE — Progress Notes (Signed)
I,Victoria Hamilton,acting as a Education administrator for Minette Brine, FNP.,have documented all relevant documentation on the behalf of Minette Brine, FNP,as directed by  Minette Brine, FNP while in the presence of Minette Brine, [place].  This visit occurred during the SARS-CoV-2 public health emergency.  Safety protocols were in place, including screening questions prior to the visit, additional usage of staff PPE, and extensive cleaning of exam room while observing appropriate contact time as indicated for disinfecting solutions.  Subjective:     Patient ID: [name] , female    DOB: 08/09/1951 , 70 y.o.   MRN: [id number]   Chief Complaint  Patient presents with   Foot Pain     HPI  Pt presents today for ED f/u. She still is in pain currently, rating 9/10. Pt had visited ED twice, she was told to come back the second time if her foot worsened. Labs were taken yesterday, she did not complete visit being "it was a mess" in the waiting room. She had awakened on 9/9 with pain to her foot. She did not wait in the ER for treatment. She is now having a burning and aching pain. Today her foot is more swollen. She has tried to keep elevated. Tylenol for pain. She is allergic to motrin. Denies drinking alcohol or eating fish, beef.   Foot Pain This is a new problem. The current episode started in the past 7 days. Pertinent negatives include no fatigue or headaches. She has tried acetaminophen (antibiotic) for the symptoms.    Past Medical History:  Diagnosis Date   Aortic atherosclerosis ([place]) 04/16/2021   Arthritis    Cataract    Diverticulosis    H/O blood transfusion reaction 2010   hives   H/O migraine    last 23yr ago   Headache(784.0)    Heart murmur    takes Bystolic nightly   History of blood clots 460yr  was on Lovenox injections and Coumadin(was only on that for short period of time)   History of shingles 3-4y40yrgo   Hx of seasonal allergies    takes Levocetirizine prn   Hyperlipidemia     takes Crestor nightly   Hypertension    takes Maxzide daily   Insomnia    takes Ambien prn   Joint pain    Joint swelling    Moderate tricuspid regurgitation    Pre-diabetes    Trigeminal neuralgia of right side of face 03/26/2016   V2 distribution     Family History  Problem Relation Age of Onset   Heart attack Mother    Alzheimer's disease Mother    Diabetes Sister    Hypertension Sister      Current Outpatient Medications:    acetaminophen (TYLENOL) 325 MG tablet, Take 650 mg by mouth every 6 (six) hours as needed for headache., Disp: , Rfl:    clindamycin (CLEOCIN) 150 MG capsule, Take 1 capsule (150 mg total) by mouth 4 (four) times daily for 10 days., Disp: 40 capsule, Rfl: 0   traMADol (ULTRAM) 50 MG tablet, Take 1 tablet (50 mg total) by mouth every 6 (six) hours as needed., Disp: 20 tablet, Rfl: 0   Cholecalciferol (DIALYVITE VITAMIN D 5000) 125 MCG (5000 UT) capsule, Take 5,000 Units by mouth daily., Disp: , Rfl:    doxycycline (VIBRAMYCIN) 100 MG capsule, Take 1 capsule (100 mg total) by mouth 2 (two) times daily., Disp: 20 capsule, Rfl: 0   gabapentin (NEURONTIN) 100 MG capsule, Take 100 mg by  mouth as needed., Disp: , Rfl:    metoprolol succinate (TOPROL XL) 25 MG 24 hr tablet, 1/2 tab po qpm until further instructions, Disp: 30 tablet, Rfl: 1   pregabalin (LYRICA) 75 MG capsule, Take 75 mg by mouth daily., Disp: , Rfl:    rosuvastatin (CRESTOR) 10 MG tablet, TAKE 1 TABLET DAILY, Disp: 90 tablet, Rfl: 3   triamterene-hydrochlorothiazide (MAXZIDE-25) 37.5-25 MG tablet, Take 1/2 tab po qam, Disp: 30 tablet, Rfl: 1   zolpidem (AMBIEN) 5 MG tablet, TAKE 1 TABLET AT BEDTIME AS NEEDED FOR SLEEP, Disp: 60 tablet, Rfl: 1   Allergies  Allergen Reactions   Ibuprofen Hives and Swelling   Penicillins Hives and Swelling    Has patient had a PCN reaction causing immediate rash, facial/tongue/throat swelling, SOB or lightheadedness with hypotension: Yes Has patient had a PCN  reaction causing severe rash involving mucus membranes or skin necrosis: No Has patient had a PCN reaction that required hospitalization: No Has patient had a PCN reaction occurring within the last 10 years: No If all of the above answers are "NO", then may proceed with Cephalosporin use. Tolerated Cephalosporin Date: 12/13/20.       Review of Systems  Constitutional: Negative.  Negative for fatigue.  Respiratory: Negative.    Cardiovascular: Negative.   Musculoskeletal:        Left foot swelling, redness and pain  Neurological: Negative.  Negative for headaches.  Psychiatric/Behavioral: Negative.      Today's Vitals   07/04/21 1552  BP: 108/60  Pulse: 86  Temp: 98.1 F (36.7 C)  Weight: 154 lb (69.9 kg)  Height: '4\' 11"'$  (1.499 m)   Body mass index is 31.1 kg/m.  Wt Readings from Last 3 Encounters:  07/04/21 154 lb (69.9 kg)  06/28/21 154 lb 12.8 oz (70.2 kg)  04/25/21 155 lb 9.6 oz (70.6 kg)    Objective:  Physical Exam Vitals reviewed.  Constitutional:      General: She is not in acute distress.    Appearance: Normal appearance. She is obese.  Cardiovascular:     Rate and Rhythm: Normal rate and regular rhythm.     Pulses: Normal pulses.     Heart sounds: Normal heart sounds. No murmur heard. Pulmonary:     Effort: Pulmonary effort is normal. No respiratory distress.     Breath sounds: Normal breath sounds.  Skin:    Findings: Erythema (left foot around great toe to mid foot) present.     Comments: Left foot is tender to touch, erythema and hot to touch  Neurological:     General: No focal deficit present.     Mental Status: She is alert and oriented to person, place, and time.     Cranial Nerves: No cranial nerve deficit.     Motor: No weakness.  Psychiatric:        Mood and Affect: Mood normal.        Behavior: Behavior normal.        Thought Content: Thought content normal.        Judgment: Judgment normal.        Assessment And Plan:     1. Foot  pain, left Will treat with Tramadol and change her doxycycline to Clindamycin she is allergic to PCN so I did not give her Rocephin. We will check on her tomorrow. Will see if she will need a few days of antigout medication - Uric acid - clindamycin (CLEOCIN) 150 MG capsule; Take 1 capsule (150  mg total) by mouth 4 (four) times daily for 10 days.  Dispense: 40 capsule; Refill: 0 - traMADol (ULTRAM) 50 MG tablet; Take 1 tablet (50 mg total) by mouth every 6 (six) hours as needed.  Dispense: 20 tablet; Refill: 0  2. Cellulitis of left lower extremity Swollen, tender and erythematous left foot no improvement with doxycycline since 9/9 - clindamycin (CLEOCIN) 150 MG capsule; Take 1 capsule (150 mg total) by mouth 4 (four) times daily for 10 days.  Dispense: 40 capsule; Refill: 0    Patient was given opportunity to ask questions. Patient verbalized understanding of the plan and was able to repeat key elements of the plan. All questions were answered to their satisfaction.  Minette Brine, FNP   I, Minette Brine, FNP, have reviewed all documentation for this visit. The documentation on 07/04/21 for the exam, diagnosis, procedures, and orders are all accurate and complete.   IF YOU HAVE BEEN REFERRED TO A SPECIALIST, IT MAY TAKE 1-2 WEEKS TO SCHEDULE/PROCESS THE REFERRAL. IF YOU HAVE NOT HEARD FROM US/SPECIALIST IN TWO WEEKS, PLEASE GIVE [place] A CALL AT [phone].   THE PATIENT IS ENCOURAGED TO PRACTICE SOCIAL DISTANCING DUE TO THE COVID-19 PANDEMIC.

## 2021-07-04 NOTE — Patient Instructions (Signed)
Foot Pain °Many things can cause foot pain. Some common causes are: °An injury. °A sprain. °Arthritis. °Blisters. °Bunions. °Follow these instructions at home: °Managing pain, stiffness, and swelling °If directed, put ice on the painful area: °Put ice in a plastic bag. °Place a towel between your skin and the bag. °Leave the ice on for 20 minutes, 2-3 times a day. ° °Activity °Do not stand or walk for long periods. °Return to your normal activities as told by your health care provider. Ask your health care provider what activities are safe for you. °Do stretches to relieve foot pain and stiffness as told by your health care provider. °Do not lift anything that is heavier than 10 lb (4.5 kg), or the limit that you are told, until your health care provider says that it is safe. Lifting a lot of weight can put added pressure on your feet. °Lifestyle °Wear comfortable, supportive shoes that fit you well. Do not wear high heels. °Keep your feet clean and dry. °General instructions °Take over-the-counter and prescription medicines only as told by your health care provider. °Rub your foot gently. °Pay attention to any changes in your symptoms. °Keep all follow-up visits as told by your health care provider. This is important. °Contact a health care provider if: °Your pain does not get better after a few days of self-care. °Your pain gets worse. °You cannot stand on your foot. °Get help right away if: °Your foot is numb or tingling. °Your foot or toes are swollen. °Your foot or toes turn white or blue. °You have warmth and redness along your foot. °Summary °Common causes of foot pain are injury, sprain, arthritis, blisters, or bunions. °Ice, medicines, and comfortable shoes may help foot pain. °Contact your health care provider if your pain does not get better after a few days of self-care. °This information is not intended to replace advice given to you by your health care provider. Make sure you discuss any questions you  have with your health care provider. °Document Revised: 01/11/2021 Document Reviewed: 01/11/2021 °Elsevier Patient Education © 2022 [employer] ° °

## 2021-07-05 LAB — URIC ACID: Uric Acid: 6.1 mg/dL (ref 3.1–7.9)

## 2021-07-18 ENCOUNTER — Other Ambulatory Visit: Payer: Self-pay | Admitting: Internal Medicine

## 2021-07-20 ENCOUNTER — Encounter: Payer: Self-pay | Admitting: Podiatry

## 2021-07-20 ENCOUNTER — Other Ambulatory Visit: Payer: Self-pay

## 2021-07-20 ENCOUNTER — Ambulatory Visit (INDEPENDENT_AMBULATORY_CARE_PROVIDER_SITE_OTHER): Payer: Medicare Other | Admitting: Podiatry

## 2021-07-20 DIAGNOSIS — M10072 Idiopathic gout, left ankle and foot: Secondary | ICD-10-CM | POA: Diagnosis not present

## 2021-07-20 NOTE — Progress Notes (Signed)
  Subjective:  Patient ID: [name], female    DOB: [birth year],   MRN: [id number]  Chief Complaint  Patient presents with   Foot Pain    Left foot pain onset 06/29/21 red, swollen and warm to touch. Went to ED got x-ray and was dx with cellulitis, ABT started, f/u with PCP 2 days later, PCP stated gout but labs came back normal. Patient still having pain in the left foot today.     70 y.o. female presents for left foot pain that has been present for several weeks. Relates the foot has been red and swollen and painful. States she has been to the ED and was diagnoses with cellulitis and put on abx with no relief. Was also evaluated for gout and uric acid levels tested and normal. Relates she is allergic to NSAIDs and does not like to take steroids. Has a surgical shoe at home that she is using . Denies any other pedal complaints. Denies n/v/f/c.   Past Medical History:  Diagnosis Date   Aortic atherosclerosis ([place]) 04/16/2021   Arthritis    Cataract    Diverticulosis    H/O blood transfusion reaction 2010   hives   H/O migraine    last 55yrs ago   Headache(784.0)    Heart murmur    takes Bystolic nightly   History of blood clots 43yrs   was on Lovenox injections and Coumadin(was only on that for short period of time)   History of shingles 3-3yrs ago   Hx of seasonal allergies    takes Levocetirizine prn   Hyperlipidemia    takes Crestor nightly   Hypertension    takes Maxzide daily   Insomnia    takes Ambien prn   Joint pain    Joint swelling    Moderate tricuspid regurgitation    Pre-diabetes    Trigeminal neuralgia of right side of face 03/26/2016   V2 distribution    Objective:  Physical Exam: Vascular: DP/PT pulses 2/4 bilateral. CFT <3 seconds. Normal hair growth on digits. No edema.  Skin. No lacerations or abrasions bilateral feet. Edema and mild erythema noted to the left midfoot. Musculoskeletal: MMT 5/5 bilateral lower extremities in DF, PF, Inversion and  Eversion. Deceased ROM in DF of ankle joint. Tenderness to palpation of the dorsal midfoot and plantar midfoot on left. Most of the tenderness directly over the third tarsometatarsal joint today.  Neurological: Sensation intact to light touch.   Assessment:   1. Acute idiopathic gout of left foot      Plan:  Patient was evaluated and treated and all questions answered. -Xrays reviewed from PCP.  -Discussed treatement options for gouty arthritis and gout education provided. -Patient declining oral steroids and NSAIDs today.  -Recommend topical voltaren use OTC.  -Patient opted for injection. After oral consent, injected left third tarsometatarsal joint with 1cc marcaine plain mixed with 0.5cc Kenalog-10 and  1 cc Dexmethasone phosphate without complication; post injection care explained. -Discussed diet and modifications.  -Surgical shoe to be worn at home -Advised patient to call if symptoms are not improved within 1 week -Patient to return to return in 4 weeks for follow-up.    Lorenda Peck, DPM

## 2021-07-26 ENCOUNTER — Other Ambulatory Visit: Payer: Self-pay | Admitting: Internal Medicine

## 2021-07-26 MED ORDER — ZOLPIDEM TARTRATE 5 MG PO TABS: 5.0000 mg | ORAL_TABLET | Freq: Every evening | ORAL | 1 refills | Status: DC | PRN

## 2021-08-14 ENCOUNTER — Ambulatory Visit: Payer: Medicare Other | Admitting: Internal Medicine

## 2021-08-15 ENCOUNTER — Other Ambulatory Visit: Payer: Self-pay

## 2021-08-15 ENCOUNTER — Ambulatory Visit: Payer: Medicare Other | Admitting: Podiatry

## 2021-08-15 ENCOUNTER — Ambulatory Visit
Admission: EM | Admit: 2021-08-15 | Discharge: 2021-08-15 | Disposition: A | Discharge status: Home / Self Care | Payer: Medicare Other

## 2021-08-15 DIAGNOSIS — U071 COVID-19: Secondary | ICD-10-CM | POA: Diagnosis not present

## 2021-08-15 LAB — POCT INFLUENZA A/B
Influenza A, POC: NEGATIVE
Influenza B, POC: NEGATIVE

## 2021-08-15 NOTE — Discharge Instructions (Addendum)
Multiple drug interactions between your medications (rosuvastatin, tramadol, zolpidem) Please contact your PCP for further recommendations.

## 2021-08-15 NOTE — ED Triage Notes (Signed)
Two day h/o cough, congestion, body aches and sore throat. Has been taking alka seltzer and tylenol with some relief. Pt is fully covid vaccinated. Husband has similar sxs.

## 2021-08-15 NOTE — ED Provider Notes (Signed)
[facility]    CSN: [id number] Arrival date & time: 08/15/21  1219      History   Chief Complaint Chief Complaint  Patient presents with   Cough   Nasal Congestion    HPI [name] is a 70 y.o. female.   Patient here today for evaluation of nasal congestion drainage, sore throat, and cough that started about 3 days ago.  She reports that symptoms are similar to that that her husband has.  They took a at home COVID test that was positive.  The history is provided by the patient.  Cough Associated symptoms: fever (101-102F TMax) and sore throat   Associated symptoms: no chills, no ear pain, no eye discharge, no shortness of breath and no wheezing    Past Medical History:  Diagnosis Date   Aortic atherosclerosis ([place]) 04/16/2021   Arthritis    Cataract    Diverticulosis    H/O blood transfusion reaction 2010   hives   H/O migraine    last 51yr ago   Headache(784.0)    Heart murmur    takes Bystolic nightly   History of blood clots 433yr  was on Lovenox injections and Coumadin(was only on that for short period of time)   History of shingles 3-4y43yrgo   Hx of seasonal allergies    takes Levocetirizine prn   Hyperlipidemia    takes Crestor nightly   Hypertension    takes Maxzide daily   Insomnia    takes Ambien prn   Joint pain    Joint swelling    Moderate tricuspid regurgitation    Pre-diabetes    Trigeminal neuralgia of right side of face 03/26/2016   V2 distribution    Patient Active Problem List   Diagnosis Date Noted   Aortic atherosclerosis ([place]6/26/2022   Chest pain 04/15/2021   Heart failure due to valvular disease, unspecified heart failure type ([facility]CC[place]5/01/2021   Obese 12/14/2020   S/P total knee arthroplasty, 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   Moderate tricuspid regurgitation 10/20/2019   Bunion of great toe of right foot 01/12/2019   Cellulitis 12/23/2018   Foot infection    Great toe pain, right 12/17/2018   Prediabetes 12/17/2018    Cellulitis of foot 08/23/2017   Trigeminal neuralgia of right side of face 03/26/2016   Hyperlipidemia 12/04/2013   Benign essential HTN 12/04/2013   Constipation 12/04/2013   Insomnia 12/04/2013   Acute blood loss anemia 12/04/2013   Left hip pain 12/04/2013   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   Pain due to total hip replacement ([facility]CC[facility] [place]6/26/2013    Past Surgical History:  Procedure Laterality Date   ABDOMINAL HYSTERECTOMY     BREAST BIOPSY  05/03/2006   [place] SUR[place]  1987   BUNIONECTOMY Right 05/11/2019   CATARACT EXTRACTION Left 07/2019   CHOLECYSTECTOMY     COLONOSCOPY     ESOPHAGOGASTRODUODENOSCOPY     EYE SURGERY Right    eye lash removed   JOINT REPLACEMENT     bilateral hip rt in 2007 and lt in 2010   NM [place]/14/2010   Normal   REDUCTION MAMMAPLASTY Bilateral 1988   REDUCTION MAMMAPLASTY     SHOULDER ARTHROSCOPY WITH DISTAL CLAVICLE RESECTION Right 09/21/2015   Procedure: RIGHT SHOULDER ARTHROSCOPY WITH DISTAL CLAVICLE EXCISION DEBRIDE LABRAL TEAR ACROMIOPLASTY ;  Surgeon: FraFrederik PearD;  [facility];  Laterality: Right;  TEE WITHOUT CARDIOVERSION N/A 04/12/2015   Procedure: TRANSESOPHAGEAL ECHOCARDIOGRAM (TEE)/BUBBLE STUDY;  Surgeon: Adrian Prows, MD;  Location: [place];  Service: Cardiovascular;  Laterality: N/A;   TOTAL HIP REVISION  04/14/2012   Procedure: TOTAL HIP REVISION;  Surgeon: Kerin Salen, MD;  Location: [place];  Service: Orthopedics;  Laterality: Right;  right total hip revision   TOTAL HIP REVISION Left 11/30/2013   Procedure: TOTAL HIP REVISION;  Surgeon: Kerin Salen, MD;  Location: [place];  Service: Orthopedics;  Laterality: Left;   TOTAL KNEE ARTHROPLASTY Right 12/13/2020   Procedure: TOTAL KNEE ARTHROPLASTY;  Surgeon: Paralee Cancel, MD;  [facility];  Service: Orthopedics;  Laterality: Right;  70 mins    OB History   No obstetric history on file.       Home Medications    Prior to Admission medications   Medication Sig Start Date End Date Taking? Authorizing Provider  acetaminophen (TYLENOL) 325 MG tablet Take 650 mg by mouth every 6 (six) hours as needed for headache.    [provider]  Cholecalciferol (DIALYVITE VITAMIN D 5000) 125 MCG (5000 UT) capsule Take 5,000 Units by mouth daily.    [provider]  gabapentin (NEURONTIN) 100 MG capsule Take 100 mg by mouth as needed.    [provider]  metoprolol succinate (TOPROL XL) 25 MG 24 hr tablet 1/2 tab po qpm until further instructions 06/28/21   Glendale Chard, MD  nebivolol (BYSTOLIC) 5 MG tablet Take 5 mg by mouth daily as needed. 08/09/21   [provider]  pregabalin (LYRICA) 75 MG capsule Take 75 mg by mouth daily. 04/12/21   [provider]  rosuvastatin (CRESTOR) 10 MG tablet TAKE 1 TABLET DAILY 05/12/21   Glendale Chard, MD  traMADol (ULTRAM) 50 MG tablet Take 1 tablet (50 mg total) by mouth every 6 (six) hours as needed. 07/04/21 07/04/22  Minette Brine, FNP  triamterene-hydrochlorothiazide [facility]) 37.5-25 MG tablet Take 1/2 tab po qam 06/28/21   Glendale Chard, MD  zolpidem (AMBIEN) 5 MG tablet Take 1 tablet (5 mg total) by mouth at bedtime as needed. for sleep 07/26/21   Glendale Chard, MD    Family History Family History  Problem Relation Age of Onset   Heart attack Mother    Alzheimer's disease Mother    Diabetes Sister    Hypertension Sister     Social History Social History   Tobacco Use   Smoking status: Former    Packs/day: 0.25    Years: 42.00    Pack years: 10.50    Types: Cigarettes    Quit date: 10/22/2018    Years since quitting: 2.8   Smokeless tobacco: Never  Vaping Use   Vaping Use: Never used  Substance Use Topics   Alcohol use: Not Currently    Comment: lst dirnk 2 years ago    Drug use: No     Allergies   Ibuprofen and Penicillins   Review of Systems Review of Systems  Constitutional:   Positive for fever (101-102F TMax). Negative for chills.  HENT:  Positive for congestion, sinus pressure and sore throat. Negative for ear pain.   Eyes:  Negative for discharge and redness.  Respiratory:  Positive for cough. Negative for shortness of breath and wheezing.   Gastrointestinal:  Negative for abdominal pain, diarrhea, nausea and vomiting.    Physical Exam Triage Vital Signs ED Triage Vitals  Enc Vitals Group     BP 08/15/21 1419 (!) 98/58  Pulse Rate 08/15/21 1413 64     Resp 08/15/21 1413 18     Temp 08/15/21 1413 98.3 F (36.8 C)     Temp Source 08/15/21 1413 Oral     SpO2 08/15/21 1413 98 %     Weight --      Height --      Head Circumference --      Peak Flow --      Pain Score 08/15/21 1416 0     Pain Loc --      Pain Edu? --      Excl. in [place]? --    No data found.  Updated Vital Signs BP (!) 98/58 Comment: Bp meds takeen last night. Pt thinks meds may have caused reading  Pulse 64   Temp 98.3 F (36.8 C) (Oral)   Resp 18   SpO2 98%      Physical Exam Vitals and nursing note reviewed.  Constitutional:      General: She is not in acute distress.    Appearance: Normal appearance. She is not ill-appearing.  HENT:     Head: Normocephalic and atraumatic.     Nose: Congestion present.     Mouth/Throat:     Mouth: Mucous membranes are moist.     Pharynx: No oropharyngeal exudate or posterior oropharyngeal erythema.  Eyes:     Conjunctiva/sclera: Conjunctivae normal.  Cardiovascular:     Rate and Rhythm: Normal rate and regular rhythm.     Heart sounds: Normal heart sounds. No murmur heard. Pulmonary:     Effort: Pulmonary effort is normal. No respiratory distress.     Breath sounds: Normal breath sounds. No wheezing, rhonchi or rales.  Skin:    General: Skin is warm and dry.  Neurological:     Mental Status: She is alert.  Psychiatric:        Mood and Affect: Mood normal.        Thought Content: Thought content normal.     UC Treatments /  Results  Labs (all labs ordered are listed, but only abnormal results are displayed) Labs Reviewed  POCT INFLUENZA A/B    EKG   Radiology No results found.  Procedures Procedures (including critical care time)  Medications Ordered in UC Medications - No data to display  Initial Impression / Assessment and Plan / UC Course  I have reviewed the triage vital signs and the nursing notes.  Pertinent labs & imaging results that were available during my care of the patient were reviewed by me and considered in my medical decision making (see chart for details).  Given COVID positive test discussed possible antiviral therapy.  After reviewing med list however there are several interactions and patient would prefer not to take.  Encouraged follow-up with her PCP for further recommendation.  Otherwise continue symptomatic management and follow-up with any worsening.  Final Clinical Impressions(s) / UC Diagnoses   Final diagnoses:  [id number]     Discharge Instructions      Multiple drug interactions between your medications (rosuvastatin, tramadol, zolpidem) Please contact your PCP for further recommendations.      ED Prescriptions   None    PDMP not reviewed this encounter.   Francene Finders, PA-C 08/15/21 [phone]

## 2021-08-16 ENCOUNTER — Ambulatory Visit: Payer: Medicare Other | Admitting: Podiatry

## 2021-08-21 DIAGNOSIS — Z20822 Contact with and (suspected) exposure to covid-19: Secondary | ICD-10-CM | POA: Diagnosis not present

## 2021-09-18 DIAGNOSIS — M25561 Pain in right knee: Secondary | ICD-10-CM | POA: Diagnosis not present

## 2021-09-27 DIAGNOSIS — S8001XA Contusion of right knee, initial encounter: Secondary | ICD-10-CM | POA: Diagnosis not present

## 2021-09-27 DIAGNOSIS — Z96651 Presence of right artificial knee joint: Secondary | ICD-10-CM | POA: Diagnosis not present

## 2021-09-27 NOTE — Progress Notes (Signed)
Primary Physician/Referring:  Glendale Chard, MD  Patient ID: [name], female    DOB: [birth year], 70 y.o.   MRN: [id number]  Chief Complaint  Patient presents with   Hypertension   Follow-up   HPI:    [name]  is a 70 y.o. African-American female with  abnormal EKG suggesting cor pulmonale, mild pulmonary hypertension, essential hypertension, hyperlipidemia, prediabetes mellitus.   Patient presents for 16-monthfollow-up.  At last office visit ordered stress test given complaints of chest pain.  Stress test was overall low risk.  Patient has had no recurrence of chest pain.  She is feeling well overall without specific complaints today.  Denies chest pain, dyspnea, dizziness, syncope, near syncope.  Denies orthopnea, leg edema, PND.  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  Past Medical History:  Diagnosis Date   [place]ortic atherosclerosis ([place] 04/16[place]   Arthritis    Cataract    Diverticulosis    H/O blood transfusion reaction 2010   hives   H/O migraine    last 240yrago   Headache(784.0)    Heart murmur    takes Bystolic nightly   History of blood clots 4y53yr was on Lovenox injections and Coumadin(was only on that for short period of time)   History of shingles 3-10yr58yro   Hx of seasonal allergies    takes Levocetirizine prn   Hyperlipidemia    takes Crestor nightly   Hypertension    takes Maxzide daily   Insomnia    takes Ambien prn   Joint pain    Joint swelling    Moderate tricuspid regurgitation    Pre-diabetes    Trigeminal neuralgia of right side of face 03/26/2016   V2 distribution   Past Surgical History:  Procedure Laterality Date   ABDOMINAL HYSTERECTOMY     BREAST BIOPSY  05/03/2006   US  [place] REDU[place] SURGERY  1987   BUNIONECTOMY Right 05/11/2019   CATARACT EXTRACTION Left 07/2019   CHOLECYSTECTOMY     COLONOSCOPY     ESOPHAGOGASTRODUODENOSCOPY     EYE SURGERY  Right    eye lash removed   JOINT REPLACEMENT     bilateral hip rt in 2007 and lt in 2010   [place]   Normal   REDUCTION MAMMAPLASTY [place]ilateral [place] WITH DISTAL CLAVICLE RESECTION Right 09/21/2015   Procedure: RIGHT SHOULDER ARTHROSCOPY WITH DISTAL CLAVICLE EXCISION DEBRIDE LABRAL TEAR ACROMIOPLASTY ;  Surgeon: FranFrederik Luna;  [facility];  Laterality: Right;   TEE WITHOUT CARDIOVERSION N/A 04/12/2015   Procedure: TRANSESOPHAGEAL ECHOCARDIOGRAM (TEE)/BUBBLE STUDY;  Surgeon: [name];  [facility];  Laterality: N/A;   TOTAL HIP REVISION  04/14/2012   Procedure: TOTAL HIP REVISION;  Surgeon: FranKerin Luna;  [facility] [place] [place];  Laterality: Right;  right total hip revision   TOTAL HIP REVISION Left 11/30/2013   Procedure: TOTAL HIP REVISION;  Surgeon: FranKerin Luna;  [facility];  Laterality: Left;   TOTAL KNEE ARTHROPLASTY Right 12/13/2020   Procedure: TOTAL KNEE ARTHROPLASTY;  Surgeon: [name];  [facility];  Service: Orthopedics;  Laterality: Right;  70 mins   Family History  Problem Relation Age of Onset   Heart attack  Mother    Alzheimer's disease Mother    Diabetes Sister    Hypertension Sister    Social History   Tobacco Use   Smoking status: Former    Packs/day: 0.25    Years: 42.00    Pack years: 10.50    Types: Cigarettes    Quit date: 10/22/2018    Years since quitting: 2.9   Smokeless tobacco: Never  Substance Use Topics   Alcohol use: Not Currently    Comment: lst dirnk 2 years ago    Marital status: Married  ROS  Review of Systems  Respiratory:  Negative for shortness of breath.   Cardiovascular:  Negative for chest pain, palpitations, orthopnea, claudication, leg swelling and PND.  Neurological:  Negative for dizziness.   Endo/Heme/Allergies: Negative.   All other systems reviewed and are negative.  Objective  Blood pressure (!) 104/58, pulse 62, temperature 97.9 F (36.6 C), temperature source Temporal, height _0  (1.499 m), weight 161 lb 8 oz (73.3 kg), SpO2 99 %.  Vitals with BMI 09/28/2021 08/15/2021 07/04/2021  Height _1  - _2   Weight 161 lbs 8 oz - 154 lbs  BMI 32.2 - 02.54  Systolic 270 98 623  Diastolic 58 58 60  Pulse 62 64 86   Orthostatic VS for the past 72 hrs (Last 3 readings):  Patient Position BP Location Cuff Size  09/28/21 1112 Sitting Left Arm Normal    Physical Exam Vitals reviewed.  Constitutional:      General: She is not in acute distress.    Appearance: She is normal weight.  Cardiovascular:     Rate and Rhythm: Normal rate and regular rhythm.     Pulses:          Carotid pulses are 2+ on the right side and 2+ on the left side.      Radial pulses are 2+ on the right side and 2+ on the left side.       Popliteal pulses are 2+ on the right side and 2+ on the left side.       Dorsalis pedis pulses are 2+ on the right side and 2+ on the left side.       Posterior tibial pulses are 2+ on the right side and 2+ on the left side.     Heart sounds: S1 normal and S2 normal. No murmur heard.    Comments: No JVD.  Pulmonary:     Effort: Pulmonary effort is normal.     Breath sounds: Normal breath sounds. No wheezing, rhonchi or rales.  Musculoskeletal:     Right lower leg: No edema.     Left lower leg: No edema.  Physical exam unchanged compared to previous office visit  Laboratory examination:   Recent Labs    12/06/20 1617 12/14/20 0338 04/15/21 1050 04/15/21 1500 06/28/21 1541 07/03/21 2012  NA 144   < > 143  --  140 138  K 4.4   < > 4.0  --  3.7 3.7  CL 103   < > 107  --  101 103  CO2 24   < > 28  --  26 26  GLUCOSE 86   < > 97  --  105* 116*  BUN 15   < > 26*  --  16 13  CREATININE 0.75   < > 0.72 0.44 0.70 0.73  CALCIUM 9.9   < > 9.7  --  9.7 9.4   GFRNONAA 81   < > >  60 >60  --  >60  GFRAA 93  --   --   --   --   --    < > = values in this interval not displayed.   CrCl cannot be calculated (Patient's most recent lab result is older than the maximum 21 days allowed.).  CMP Latest Ref Rng & Units 07/03/2021 06/28/2021 04/15/2021  Glucose 70 - 99 mg/dL 116(H) 105(H) -  BUN 8 - 23 mg/dL 13 16 -  Creatinine 0.44 - 1.00 mg/dL 0.73 0.70 0.44  Sodium 135 - 145 mmol/L 138 140 -  Potassium 3.5 - 5.1 mmol/L 3.7 3.7 -  Chloride 98 - 111 mmol/L 103 101 -  CO2 22 - 32 mmol/L 26 26 -  Calcium 8.9 - 10.3 mg/dL 9.4 9.7 -  Total Protein 6.5 - 8.1 g/dL 7.5 - -  Total Bilirubin 0.3 - 1.2 mg/dL 0.6 - -  Alkaline Phos 38 - 126 U/L 65 - -  AST 15 - 41 U/L 22 - -  ALT 0 - 44 U/L 14 14 -   CBC Latest Ref Rng & Units 07/03/2021 04/16/2021 04/15/2021  WBC 4.0 - 10.5 K/uL 6.9 5.0 3.6(L)  Hemoglobin 12.0 - 15.0 g/dL 13.1 11.6(L) 9.3(L)  Hematocrit 36.0 - 46.0 % 41.1 37.0 29.9(L)  Platelets 150 - 400 K/uL 181 205 160   Lipid Panel     Component Value Date/Time   CHOL 70 04/15/2021 1500   CHOL 126 12/24/2019 1218   TRIG 39 04/15/2021 1500   HDL 33 (L) 04/15/2021 1500   HDL 55 12/24/2019 1218   CHOLHDL 2.1 04/15/2021 1500   VLDL 8 04/15/2021 1500   LDLCALC 29 04/15/2021 1500   LDLCALC 58 12/24/2019 1218   HEMOGLOBIN A1C Lab Results  Component Value Date   HGBA1C 6.2 (H) 06/28/2021   MPG 128 12/07/2020   TSH Recent Labs    06/28/21 1541  TSH 1.020    Allergies   Allergies  Allergen Reactions   Ibuprofen Hives and Swelling   Penicillins Hives and Swelling    Has patient had a PCN reaction causing immediate rash, facial/tongue/throat swelling, SOB or lightheadedness with hypotension: Yes Has patient had a PCN reaction causing severe rash involving mucus membranes or skin necrosis: No Has patient had a PCN reaction that required hospitalization: No Has patient had a PCN reaction occurring within the last 10 years: No If all of the above  answers are "NO", then may proceed with Cephalosporin use. Tolerated Cephalosporin Date: 12/13/20.      Medications Prior to Visit:   Outpatient Medications Prior to Visit  Medication Sig Dispense Refill   acetaminophen (TYLENOL) 325 MG tablet Take 650 mg by mouth every 6 (six) hours as needed for headache.     Cholecalciferol (DIALYVITE VITAMIN D 5000) 125 MCG (5000 UT) capsule Take 5,000 Units by mouth daily.     nebivolol (BYSTOLIC) 5 MG tablet Take 5 mg by mouth daily.     pregabalin (LYRICA) 75 MG capsule Take 75 mg by mouth daily.     rosuvastatin (CRESTOR) 10 MG tablet TAKE 1 TABLET DAILY 90 tablet 3   traMADol (ULTRAM) 50 MG tablet Take 1 tablet (50 mg total) by mouth every 6 (six) hours as needed. 20 tablet 0   triamterene-hydrochlorothiazide (MAXZIDE-25) 37.5-25 MG tablet Take 1/2 tab po qam (Patient taking differently: Take 1 tablet by mouth daily.) 30 tablet 1   zolpidem (AMBIEN) 5 MG tablet Take 1 tablet (5 mg total) by  mouth at bedtime as needed. for sleep 60 tablet 1   gabapentin (NEURONTIN) 100 MG capsule Take 100 mg by mouth as needed.     methocarbamol (ROBAXIN) 500 MG tablet Take 1 tablet by mouth as needed.     metoprolol succinate (TOPROL XL) 25 MG 24 hr tablet 1/2 tab po qpm until further instructions 30 tablet 1   No facility-administered medications prior to visit.   Final Medications at End of Visit    Current Meds  Medication Sig   acetaminophen (TYLENOL) 325 MG tablet Take 650 mg by mouth every 6 (six) hours as needed for headache.   Cholecalciferol (DIALYVITE VITAMIN D 5000) 125 MCG (5000 UT) capsule Take 5,000 Units by mouth daily.   nebivolol (BYSTOLIC) 5 MG tablet Take 5 mg by mouth daily.   pregabalin (LYRICA) 75 MG capsule Take 75 mg by mouth daily.   rosuvastatin (CRESTOR) 10 MG tablet TAKE 1 TABLET DAILY   traMADol (ULTRAM) 50 MG tablet Take 1 tablet (50 mg total) by mouth every 6 (six) hours as needed.   triamterene-hydrochlorothiazide (MAXZIDE-25)  37.5-25 MG tablet Take 1/2 tab po qam (Patient taking differently: Take 1 tablet by mouth daily.)   zolpidem (AMBIEN) 5 MG tablet Take 1 tablet (5 mg total) by mouth at bedtime as needed. for sleep   [DISCONTINUED] gabapentin (NEURONTIN) 100 MG capsule Take 100 mg by mouth as needed.   Radiology:  No results found.  Cardiac Studies:   Lexiscan Myoview stress test 11/04/2008: Normal perfusion, no evidence of ischemia, EF 80%  Coronary Angiogram 2012 No stents placed  TEE 04/11/2016:  LV: Normal. Normal EF. RV: Normal LA: Normal. Left atrial appendage: Normal without thrombus. Normal function. Inter atrial septum is intact without defect. Double contrast study negative for atrial level shunting. No late appearance of bubbles either. RA: Normal MV: Normal Trace MR. TV: Normal Trace TR AV: Normal. No AI or AS. PV: Normal. Trace PI.  Echocardiogram 09/16/2019: Left ventricle cavity is normal in size. Mild concentric hypertrophy of the left ventricle. Normal LV systolic function with EF 55%. Normal global wall motion. Doppler evidence of grade I (impaired) diastolic dysfunction, normal LAP. Left atrial cavity is moderately dilated. Mild to moderate mitral regurgitation. Moderate tricuspid regurgitation. Estimated pulmonary artery systolic pressure is 39 mmHg.  Compared to previous study on 07/04/2017, estimated PASP increased form 31 mmHg to 39 mmHg.  PCV MYOCARDIAL PERFUSION WITH LEXISCAN 05/22/2021 Lexiscan nuclear stress test performed using 1-day protocol. Patient reported dizziness with hypotension 78/50 mmHg post lexiscan injection. Normal myocardial perfusion. Stress LVEF 54%. Low risk study.  EKG   04/25/2021: Sinus rhythm with frequent PACs in bigeminal pattern at a rate of 74 bpm.  Normal axis.  Biatrial enlargement.  Nonspecific T wave abnormality.  Low voltage complexes.  Compared to EKG 09/29/2020, atrial bigeminy new, otherwise no significant change.  09/29/2020: sinus  rhythm at a rate of 67 bpm, biatrial enlargement.  Normal axis.  Nonspecific T wave abnormality, cannot exclude anterior lateral ischemia.  Low voltage complexes.  Pattern consistent with pulmonary disease.  Compared to EKG 10/20/2019, left atrial enlargement new.  10/20/2019: Normal sinus rhythm at rate of 70 bpm, right atrial enlargement, T wave abnormality, cannot exclude anterolateral ischemia.  May represent RV strain.  Low-voltage complexes.  Pulmonary disease pattern.  No significant change from 07/29/2018.  Assessment     ICD-10-CM   1. Essential hypertension  I10     2. Precordial pain  R07.2  3. Moderate tricuspid regurgitation  I07.1 PCV ECHOCARDIOGRAM COMPLETE     No orders of the defined types were placed in this encounter.  Medications Discontinued During This Encounter  Medication Reason   gabapentin (NEURONTIN) 100 MG capsule Completed Course   metoprolol succinate (TOPROL XL) 25 MG 24 hr tablet    Recommendations:    BRIANCA FORTENBERRY  is a 70 y.o.  African-American female with  abnormal EKG suggesting cor pulmonale, mild pulmonary hypertension, essential hypertension, hyperlipidemia, prediabetes mellitus. She quit smoking Jan 2020.    Patient presents for 38-monthfollow-up.  At last office visit ordered stress test given complaints of chest pain.  Stress test was overall low risk.  Patient is feeling well and is presently asymptomatic without recurrence of chest pain.  Reviewed and discussed results of stress test, details above.  I personally reviewed external labs, lipids are well controlled and A1c is under excellent control.  Patient's blood pressure is well controlled, continue bisoprolol and Maxide.  Patient has history of mild to moderate valvular disease, will therefore repeat echocardiogram prior to next office visit.  Follow-up in 1 year, sooner if needed.   CAlethia Berthold PA-C 09/28/2021, 2:11 PM Office: [phone]

## 2021-09-28 ENCOUNTER — Ambulatory Visit: Payer: Medicare Other | Admitting: Student

## 2021-09-28 ENCOUNTER — Other Ambulatory Visit: Payer: Self-pay

## 2021-09-28 ENCOUNTER — Encounter: Payer: Self-pay | Admitting: Student

## 2021-09-28 VITALS — BP 104/58 | HR 62 | Temp 97.9°F | Ht 59.0 in | Wt 161.5 lb

## 2021-09-28 DIAGNOSIS — I10 Essential (primary) hypertension: Secondary | ICD-10-CM | POA: Diagnosis not present

## 2021-09-28 DIAGNOSIS — I071 Rheumatic tricuspid insufficiency: Secondary | ICD-10-CM

## 2021-09-28 DIAGNOSIS — R072 Precordial pain: Secondary | ICD-10-CM | POA: Diagnosis not present

## 2021-10-08 DIAGNOSIS — S8001XA Contusion of right knee, initial encounter: Secondary | ICD-10-CM | POA: Insufficient documentation

## 2021-10-21 ENCOUNTER — Other Ambulatory Visit: Payer: Self-pay | Admitting: Internal Medicine

## 2021-12-13 DIAGNOSIS — H40023 Open angle with borderline findings, high risk, bilateral: Secondary | ICD-10-CM | POA: Diagnosis not present

## 2021-12-13 DIAGNOSIS — B0239 Other herpes zoster eye disease: Secondary | ICD-10-CM | POA: Diagnosis not present

## 2021-12-13 DIAGNOSIS — Z961 Presence of intraocular lens: Secondary | ICD-10-CM | POA: Diagnosis not present

## 2021-12-27 ENCOUNTER — Encounter: Payer: Self-pay | Admitting: Podiatry

## 2021-12-27 ENCOUNTER — Other Ambulatory Visit: Payer: Self-pay

## 2021-12-27 ENCOUNTER — Ambulatory Visit (INDEPENDENT_AMBULATORY_CARE_PROVIDER_SITE_OTHER): Payer: Medicare Other | Admitting: Podiatry

## 2021-12-27 DIAGNOSIS — M7661 Achilles tendinitis, right leg: Secondary | ICD-10-CM | POA: Diagnosis not present

## 2021-12-27 DIAGNOSIS — M722 Plantar fascial fibromatosis: Secondary | ICD-10-CM

## 2021-12-27 MED ORDER — PREGABALIN 75 MG PO CAPS: 75.0000 mg | ORAL_CAPSULE | Freq: Every day | ORAL | 0 refills | Status: DC

## 2021-12-27 NOTE — Progress Notes (Signed)
he

## 2021-12-27 NOTE — Progress Notes (Signed)
?  Subjective:  ?Patient ID: [name], female    DOB: [birth year],   MRN: [id number] ? ?Chief Complaint  ?Patient presents with  ? Foot Pain  ?  Right heel pain . Pain is constant , throbbing and shooting pain. Pain travel to the back of her ankle , when in pain the only thing that helps is otc medications  ? ? ?71 y.o. female presents for new concern of right heel pain. Relates it has been going on for about three months. States pain has been constant and has been affecting her gait. Has tried some OTC medications for pain.  . Denies any other pedal complaints. Denies n/v/f/c.  ? ?Past Medical History:  ?Diagnosis Date  ? Aortic atherosclerosis ([place]) 04/16/2021  ? Arthritis   ? Cataract   ? Diverticulosis   ? H/O blood transfusion reaction 2010  ? hives  ? H/O migraine   ? last 73yr ago  ? Headache(784.0)   ? Heart murmur   ? takes Bystolic nightly  ? History of blood clots 499yr ? was on Lovenox injections and Coumadin(was only on that for short period of time)  ? History of shingles 3-4y24yrgo  ? Hx of seasonal allergies   ? takes Levocetirizine prn  ? Hyperlipidemia   ? takes Crestor nightly  ? Hypertension   ? takes Maxzide daily  ? Insomnia   ? takes Ambien prn  ? Joint pain   ? Joint swelling   ? Moderate tricuspid regurgitation   ? Pre-diabetes   ? Trigeminal neuralgia of right side of face 03/26/2016  ? V2 distribution  ? ? ?Objective:  ?Physical Exam: ?Vascular: DP/PT pulses 2/4 bilateral. CFT <3 seconds. Normal hair growth on digits. No edema.  ?Skin. No lacerations or abrasions bilateral feet.  ?Musculoskeletal: MMT 5/5 bilateral lower extremities in DF, PF, Inversion and Eversion. Deceased ROM in DF of ankle joint. Tender to palpation of right medial calcaneal tubercle and along achilles tendon insertion. No pain with calcaneal squeeze. No pain along PT tendon or in arch.  ?Neurological: Sensation intact to light touch.  ? ?Assessment:  ? ?1. Plantar fasciitis of right foot   ?2. Tendonitis, Achilles,  right   ? ? ? ?Plan:  ?Patient was evaluated and treated and all questions answered. ?Discussed plantar fasciitis with patient.  ?X-rays reviewed and discussed with patient. No acute fractures or dislocations noted. Mild spurring noted at inferior calcaneus.  ?Discussed treatment options including, ice, NSAIDS, supportive shoes, bracing, and stretching. Stretching exercises provided to be done on a daily basis.   ?Prescription for Lyrica provided and sent to pharmacy. ?PF brace dispensed.  ?Patient requesting injection today. Procedure note below.   ?Follow-up 6 weeks or sooner if any problems arise. In the meantime, encouraged to call the office with any questions, concerns, change in symptoms.  ? ? ? ?RebLorenda PeckPM  ? ? ?

## 2021-12-27 NOTE — Patient Instructions (Signed)

## 2022-01-05 DIAGNOSIS — Z96651 Presence of right artificial knee joint: Secondary | ICD-10-CM | POA: Diagnosis not present

## 2022-01-09 ENCOUNTER — Other Ambulatory Visit: Payer: Self-pay | Admitting: Internal Medicine

## 2022-01-09 DIAGNOSIS — Z1231 Encounter for screening mammogram for malignant neoplasm of breast: Secondary | ICD-10-CM

## 2022-02-07 ENCOUNTER — Ambulatory Visit (INDEPENDENT_AMBULATORY_CARE_PROVIDER_SITE_OTHER): Payer: Medicare Other | Admitting: Podiatry

## 2022-02-07 ENCOUNTER — Encounter: Payer: Self-pay | Admitting: Podiatry

## 2022-02-07 DIAGNOSIS — M722 Plantar fascial fibromatosis: Secondary | ICD-10-CM | POA: Diagnosis not present

## 2022-02-07 NOTE — Progress Notes (Signed)
?  Subjective:  ?Patient ID: [name], female    DOB: 06/20/1951,   MRN: [id number] ? ?Chief Complaint  ?Patient presents with  ? Plantar Fasciitis  ?  The right heel is better and the brace did help   ? ? ?71 y.o. female presents for follow-up of right heel pain. Relates it is doing about 80% better. Relates she has been stretching and wearing brace and doing very well.  Denies any other pedal complaints. Denies n/v/f/c.  ? ?Past Medical History:  ?Diagnosis Date  ? Aortic atherosclerosis ([place]) 04/16/2021  ? Arthritis   ? Cataract   ? Diverticulosis   ? H/O blood transfusion reaction 2010  ? hives  ? H/O migraine   ? last 60yr ago  ? Headache(784.0)   ? Heart murmur   ? takes Bystolic nightly  ? History of blood clots 444yr ? was on Lovenox injections and Coumadin(was only on that for short period of time)  ? History of shingles 3-4y83yrgo  ? Hx of seasonal allergies   ? takes Levocetirizine prn  ? Hyperlipidemia   ? takes Crestor nightly  ? Hypertension   ? takes Maxzide daily  ? Insomnia   ? takes Ambien prn  ? Joint pain   ? Joint swelling   ? Moderate tricuspid regurgitation   ? Pre-diabetes   ? Trigeminal neuralgia of right side of face 03/26/2016  ? V2 distribution  ? ? ?Objective:  ?Physical Exam: ?Vascular: DP/PT pulses 2/4 bilateral. CFT <3 seconds. Normal hair growth on digits. No edema.  ?Skin. No lacerations or abrasions bilateral feet.  ?Musculoskeletal: MMT 5/5 bilateral lower extremities in DF, PF, Inversion and Eversion. Deceased ROM in DF of ankle joint. Non tender to palpation of right medial calcaneal tubercle and along achilles tendon insertion. No pain with calcaneal squeeze. No pain along PT tendon or in arch.  ?Neurological: Sensation intact to light touch.  ? ?Assessment:  ? ?1. Plantar fasciitis of right foot   ? ? ? ?Plan:  ?Patient was evaluated and treated and all questions answered. ?Discussed plantar fasciitis with patient.  ?X-rays reviewed and discussed with patient. No acute  fractures or dislocations noted. Mild spurring noted at inferior calcaneus.  ?Discussed treatment options including, ice, NSAIDS, supportive shoes, bracing, and stretching.  ?Continue streching and brace as needed. .  Marland Kitchen?Follow-up as needed.  ? ? ? ?RebLorenda PeckPM  ? ? ?

## 2022-02-08 ENCOUNTER — Ambulatory Visit (INDEPENDENT_AMBULATORY_CARE_PROVIDER_SITE_OTHER): Payer: Medicare Other | Admitting: Internal Medicine

## 2022-02-08 ENCOUNTER — Ambulatory Visit (INDEPENDENT_AMBULATORY_CARE_PROVIDER_SITE_OTHER): Payer: Medicare Other

## 2022-02-08 ENCOUNTER — Encounter: Payer: Self-pay | Admitting: Internal Medicine

## 2022-02-08 VITALS — BP 102/60 | HR 87 | Temp 97.9°F | Ht 60.8 in | Wt 165.0 lb

## 2022-02-08 VITALS — BP 102/60 | HR 87 | Temp 97.9°F | Ht 60.8 in | Wt 165.6 lb

## 2022-02-08 DIAGNOSIS — I700 Atherosclerosis of aorta: Secondary | ICD-10-CM

## 2022-02-08 DIAGNOSIS — R110 Nausea: Secondary | ICD-10-CM | POA: Diagnosis not present

## 2022-02-08 DIAGNOSIS — N3941 Urge incontinence: Secondary | ICD-10-CM | POA: Diagnosis not present

## 2022-02-08 DIAGNOSIS — Z0000 Encounter for general adult medical examination without abnormal findings: Secondary | ICD-10-CM

## 2022-02-08 DIAGNOSIS — R7303 Prediabetes: Secondary | ICD-10-CM

## 2022-02-08 DIAGNOSIS — Z6831 Body mass index (BMI) 31.0-31.9, adult: Secondary | ICD-10-CM

## 2022-02-08 DIAGNOSIS — E6609 Other obesity due to excess calories: Secondary | ICD-10-CM | POA: Diagnosis not present

## 2022-02-08 DIAGNOSIS — I10 Essential (primary) hypertension: Secondary | ICD-10-CM | POA: Diagnosis not present

## 2022-02-08 DIAGNOSIS — E7800 Pure hypercholesterolemia, unspecified: Secondary | ICD-10-CM

## 2022-02-08 LAB — POCT URINALYSIS DIPSTICK
Bilirubin, UA: NEGATIVE
Blood, UA: NEGATIVE
Glucose, UA: NEGATIVE
Ketones, UA: NEGATIVE
Leukocytes, UA: NEGATIVE
Nitrite, UA: NEGATIVE
Protein, UA: NEGATIVE
Spec Grav, UA: 1.02 (ref 1.010–1.025)
Urobilinogen, UA: 0.2 E.U./dL
pH, UA: 7 (ref 5.0–8.0)

## 2022-02-08 MED ORDER — ONDANSETRON 4 MG PO TBDP: 4.0000 mg | ORAL_TABLET | Freq: Three times a day (TID) | ORAL | 0 refills | Status: AC | PRN

## 2022-02-08 NOTE — Patient Instructions (Addendum)
Overactive Bladder, Adult ? ?Overactive bladder is a condition in which a person has a sudden and frequent need to urinate. A person might also leak urine if he or she cannot get to the bathroom fast enough (urinary incontinence). Sometimes, symptoms can interfere with work or social activities. ?What are the causes? ?Overactive bladder is associated with poor nerve signals between your bladder and your brain. Your bladder may get the signal to empty before it is full. You may also have very sensitive muscles that make your bladder squeeze too soon. This condition may also be caused by other factors, such as: ?Medical conditions: ?Urinary tract infection. ?Infection of nearby tissues. ?Prostate enlargement. ?Bladder stones, inflammation, or tumors. ?Diabetes. ?Muscle or nerve weakness, especially from these conditions: ?A spinal cord injury. ?Stroke. ?Multiple sclerosis. ?Parkinson's disease. ?Other causes: ?Surgery on the uterus or urethra. ?Drinking too much caffeine or alcohol. ?Certain medicines, especially those that eliminate extra fluid in the body (diuretics). ?Constipation. ?What increases the risk? ?You may be at greater risk for overactive bladder if you: ?Are an older adult. ?Smoke. ?Are going through menopause. ?Have prostate problems. ?Have a neurological disease, such as stroke, dementia, Parkinson's disease, or multiple sclerosis (MS). ?Eat or drink alcohol, spicy food, caffeine, and other things that irritate the bladder. ?Are overweight or obese. ?What are the signs or symptoms? ?Symptoms of this condition include a sudden, strong urge to urinate. Other symptoms include: ?Leaking urine. ?Urinating 8 or more times a day. ?Waking up to urinate 2 or more times overnight. ?How is this diagnosed? ?This condition may be diagnosed based on: ?Your symptoms and medical history. ?A physical exam. ?Blood or urine tests to check for possible causes, such as infection. ?You may also need to see a health care  provider who specializes in urinary tract problems. This is called a urologist. ?How is this treated? ?Treatment for overactive bladder depends on the cause of your condition and whether it is mild or severe. Treatment may include: ?Bladder training, such as: ?Learning to control the urge to urinate by following a schedule to urinate at regular intervals. ?Doing Kegel exercises to strengthen the pelvic floor muscles that support your bladder. ?Special devices, such as: ?Biofeedback. This uses sensors to help you become aware of your body's signals. ?Electrical stimulation. This uses electrodes placed inside the body (implanted) or outside the body. These electrodes send gentle pulses of electricity to strengthen the nerves or muscles that control the bladder. ?Women may use a plastic device, called a pessary, that fits into the vagina and supports the bladder. ?Medicines, such as: ?Antibiotics to treat bladder infection. ?Antispasmodics to stop the bladder from releasing urine at the wrong time. ?Tricyclic antidepressants to relax bladder muscles. ?Injections of botulinum toxin type A directly into the bladder tissue to relax bladder muscles. ?Surgery, such as: ?A device may be implanted to help manage the nerve signals that control urination. ?An electrode may be implanted to stimulate electrical signals in the bladder. ?A procedure may be done to change the shape of the bladder. This is done only in very severe cases. ?Follow these instructions at home: ?Eating and drinking ? ?Make diet or lifestyle changes recommended by your health care provider. These may include: ?Drinking fluids throughout the day and not only with meals. ?Cutting down on caffeine or alcohol. ?Eating a healthy and balanced diet to prevent constipation. This may include: ?Choosing foods that are high in fiber, such as beans, whole grains, and fresh fruits and vegetables. ?Limiting foods that  are high in fat and processed sugars, such as fried  and sweet foods. ?Lifestyle ? ?Lose weight if needed. ?Do not use any products that contain nicotine or tobacco. These include cigarettes, chewing tobacco, and vaping devices, such as e-cigarettes. If you need help quitting, ask your health care provider. ?General instructions ?Take over-the-counter and prescription medicines only as told by your health care provider. ?If you were prescribed an antibiotic medicine, take it as told by your health care provider. Do not stop taking the antibiotic even if you start to feel better. ?Use any implants or pessary as told by your health care provider. ?If needed, wear pads to absorb urine leakage. ?Keep a log to track how much and when you drink, and when you need to urinate. This will help your health care provider monitor your condition. ?Keep all follow-up visits. This is important. ?Contact a health care provider if: ?You have a fever or chills. ?Your symptoms do not get better with treatment. ?Your pain and discomfort get worse. ?You have more frequent urges to urinate. ?Get help right away if: ?You are not able to control your bladder. ?Summary ?Overactive bladder refers to a condition in which a person has a sudden and frequent need to urinate. ?Several conditions may lead to an overactive bladder. ?Treatment for overactive bladder depends on the cause and severity of your condition. ?Making lifestyle changes, doing Kegel exercises, keeping a log, and taking medicines can help with this condition. ?This information is not intended to replace advice given to you by your health care provider. Make sure you discuss any questions you have with your health care provider. ?Document Revised: 06/27/2020 Document Reviewed: 06/27/2020 ?Elsevier Patient Education ? [place]. ? ? ?Hypertension, Adult ?High blood pressure (hypertension) is when the force of blood pumping through the arteries is too strong. The arteries are the blood vessels that carry blood from the heart  throughout the body. Hypertension forces the heart to work harder to pump blood and may cause arteries to become narrow or stiff. Untreated or uncontrolled hypertension can lead to a heart attack, heart failure, a stroke, kidney disease, and other problems. ?A blood pressure reading consists of a higher number over a lower number. Ideally, your blood pressure should be below 120/80. The first ("top") number is called the systolic pressure. It is a measure of the pressure in your arteries as your heart beats. The second ("bottom") number is called the diastolic pressure. It is a measure of the pressure in your arteries as the heart relaxes. ?What are the causes? ?The exact cause of this condition is not known. There are some conditions that result in high blood pressure. ?What increases the risk? ?Certain factors may make you more likely to develop high blood pressure. Some of these risk factors are under your control, including: ?Smoking. ?Not getting enough exercise or physical activity. ?Being overweight. ?Having too much fat, sugar, calories, or salt (sodium) in your diet. ?Drinking too much alcohol. ?Other risk factors include: ?Having a personal history of heart disease, diabetes, high cholesterol, or kidney disease. ?Stress. ?Having a family history of high blood pressure and high cholesterol. ?Having obstructive sleep apnea. ?Age. The risk increases with age. ?What are the signs or symptoms? ?High blood pressure may not cause symptoms. Very high blood pressure (hypertensive crisis) may cause: ?Headache. ?Fast or irregular heartbeats (palpitations). ?Shortness of breath. ?Nosebleed. ?Nausea and vomiting. ?Vision changes. ?Severe chest pain, dizziness, and seizures. ?How is this diagnosed? ?This condition is diagnosed  by measuring your blood pressure while you are seated, with your arm resting on a flat surface, your legs uncrossed, and your feet flat on the floor. The cuff of the blood pressure monitor will be  placed directly against the skin of your upper arm at the level of your heart. Blood pressure should be measured at least twice using the same arm. Certain conditions can cause a difference in blood p

## 2022-02-08 NOTE — Patient Instructions (Signed)
Ms. Dumm , ?Thank you for taking time to come for your Medicare Wellness Visit. I appreciate your ongoing commitment to your health goals. Please review the following plan we discussed and let me know if I can assist you in the future.  ? ?Screening recommendations/referrals: ?Colonoscopy: completed 04/08/2017, due 04/09/2027 ?Mammogram: scheduled for 03/20/2022 ?Bone Density: completed 09/27/2015 ?Recommended yearly ophthalmology/optometry visit for glaucoma screening and checkup ?Recommended yearly dental visit for hygiene and checkup ? ?Vaccinations: ?Influenza vaccine: due 05/22/2022 ?Pneumococcal vaccine: completed 02/22/2021 ?Tdap vaccine: completed 08/23/2017, due 08/24/2027 ?Shingles vaccine: discussed   ?Covid-19: 07/26/2020, 12/04/2019, 11/12/2019 ? ?Advanced directives: Advance directive discussed with you today. Even though you declined this today please call our office should you change your mind and we can give you the proper paperwork for you to fill out. ? ?Conditions/risks identified: none ? ?Next appointment: Follow up in one year for your annual wellness visit  ? ? ?Preventive Care 64 Years and Older, Female ?Preventive care refers to lifestyle choices and visits with your health care provider that can promote health and wellness. ?What does preventive care include? ?A yearly physical exam. This is also called an annual well check. ?Dental exams once or twice a year. ?Routine eye exams. Ask your health care provider how often you should have your eyes checked. ?Personal lifestyle choices, including: ?Daily care of your teeth and gums. ?Regular physical activity. ?Eating a healthy diet. ?Avoiding tobacco and drug use. ?Limiting alcohol use. ?Practicing safe sex. ?Taking low-dose aspirin every day. ?Taking vitamin and mineral supplements as recommended by your health care provider. ?What happens during an annual well check? ?The services and screenings done by your health care provider during your annual well  check will depend on your age, overall health, lifestyle risk factors, and family history of disease. ?Counseling  ?Your health care provider may ask you questions about your: ?Alcohol use. ?Tobacco use. ?Drug use. ?Emotional well-being. ?Home and relationship well-being. ?Sexual activity. ?Eating habits. ?History of falls. ?Memory and ability to understand (cognition). ?Work and work Statistician. ?Reproductive health. ?Screening  ?You may have the following tests or measurements: ?Height, weight, and BMI. ?Blood pressure. ?Lipid and cholesterol levels. These may be checked every 5 years, or more frequently if you are over 73 years old. ?Skin check. ?Lung cancer screening. You may have this screening every year starting at age 29 if you have a 30-pack-year history of smoking and currently smoke or have quit within the past 15 years. ?Fecal occult blood test (FOBT) of the stool. You may have this test every year starting at age 7. ?Flexible sigmoidoscopy or colonoscopy. You may have a sigmoidoscopy every 5 years or a colonoscopy every 10 years starting at age 28. ?Hepatitis C blood test. ?Hepatitis B blood test. ?Sexually transmitted disease (STD) testing. ?Diabetes screening. This is done by checking your blood sugar (glucose) after you have not eaten for a while (fasting). You may have this done every 1-3 years. ?Bone density scan. This is done to screen for osteoporosis. You may have this done starting at age 10. ?Mammogram. This may be done every 1-2 years. Talk to your health care provider about how often you should have regular mammograms. ?Talk with your health care provider about your test results, treatment options, and if necessary, the need for more tests. ?Vaccines  ?Your health care provider may recommend certain vaccines, such as: ?Influenza vaccine. This is recommended every year. ?Tetanus, diphtheria, and acellular pertussis (Tdap, Td) vaccine. You may need a Td booster  every 10 years. ?Zoster  vaccine. You may need this after age 31. ?Pneumococcal 13-valent conjugate (PCV13) vaccine. One dose is recommended after age 87. ?Pneumococcal polysaccharide (PPSV23) vaccine. One dose is recommended after age 88. ?Talk to your health care provider about which screenings and vaccines you need and how often you need them. ?This information is not intended to replace advice given to you by your health care provider. Make sure you discuss any questions you have with your health care provider. ?Document Released: 11/04/2015 Document Revised: 06/27/2016 Document Reviewed: 08/09/2015 ?Elsevier Interactive Patient Education ? 2017 [place]. ? ?Fall Prevention in the Home ?Falls can cause injuries. They can happen to people of all ages. There are many things you can do to make your home safe and to help prevent falls. ?What can I do on the outside of my home? ?Regularly fix the edges of walkways and driveways and fix any cracks. ?Remove anything that might make you trip as you walk through a door, such as a raised step or threshold. ?Trim any bushes or trees on the path to your home. ?Use bright outdoor lighting. ?Clear any walking paths of anything that might make someone trip, such as rocks or tools. ?Regularly check to see if handrails are loose or broken. Make sure that both sides of any steps have handrails. ?Any raised decks and porches should have guardrails on the edges. ?Have any leaves, snow, or ice cleared regularly. ?Use sand or salt on walking paths during winter. ?Clean up any spills in your garage right away. This includes oil or grease spills. ?What can I do in the bathroom? ?Use night lights. ?Install grab bars by the toilet and in the tub and shower. Do not use towel bars as grab bars. ?Use non-skid mats or decals in the tub or shower. ?If you need to sit down in the shower, use a plastic, non-slip stool. ?Keep the floor dry. Clean up any water that spills on the floor as soon as it happens. ?Remove  soap buildup in the tub or shower regularly. ?Attach bath mats securely with double-sided non-slip rug tape. ?Do not have throw rugs and other things on the floor that can make you trip. ?What can I do in the bedroom? ?Use night lights. ?Make sure that you have a light by your bed that is easy to reach. ?Do not use any sheets or blankets that are too big for your bed. They should not hang down onto the floor. ?Have a firm chair that has side arms. You can use this for support while you get dressed. ?Do not have throw rugs and other things on the floor that can make you trip. ?What can I do in the kitchen? ?Clean up any spills right away. ?Avoid walking on wet floors. ?Keep items that you use a lot in easy-to-reach places. ?If you need to reach something above you, use a strong step stool that has a grab bar. ?Keep electrical cords out of the way. ?Do not use floor polish or wax that makes floors slippery. If you must use wax, use non-skid floor wax. ?Do not have throw rugs and other things on the floor that can make you trip. ?What can I do with my stairs? ?Do not leave any items on the stairs. ?Make sure that there are handrails on both sides of the stairs and use them. Fix handrails that are broken or loose. Make sure that handrails are as long as the stairways. ?Check any carpeting to  make sure that it is firmly attached to the stairs. Fix any carpet that is loose or worn. ?Avoid having throw rugs at the top or bottom of the stairs. If you do have throw rugs, attach them to the floor with carpet tape. ?Make sure that you have a light switch at the top of the stairs and the bottom of the stairs. If you do not have them, ask someone to add them for you. ?What else can I do to help prevent falls? ?Wear shoes that: ?Do not have high heels. ?Have rubber bottoms. ?Are comfortable and fit you well. ?Are closed at the toe. Do not wear sandals. ?If you use a stepladder: ?Make sure that it is fully opened. Do not climb a  closed stepladder. ?Make sure that both sides of the stepladder are locked into place. ?Ask someone to hold it for you, if possible. ?Clearly mark and make sure that you can see: ?Any grab bars or handrails.

## 2022-02-08 NOTE — Progress Notes (Signed)
?Industrial/product designer as a Education administrator for [name], MD.,have documented all relevant documentation on the behalf of [name], MD,as directed by  [name], MD while in the presence of [name], MD. ? ?This visit occurred during the SARS-CoV-2 public health emergency.  Safety protocols were in place, including screening questions prior to the visit, additional usage of staff PPE, and extensive cleaning of exam room while observing appropriate contact time as indicated for disinfecting solutions. ? ?Subjective:  ?  ? Patient ID: [name] , female    DOB: [birth year] , 71 y.o.   MRN: [id number] ? ? ?Chief Complaint  ?Patient presents with  ? Hypertension  ? ? ?HPI ? ?The patient is here today for a blood pressure follow-up.  She reports compliance with meds. She denies headaches, chest pain and shortness of breath.  ? ?She was also seen by [facility] for [facility] .  ? ?Hypertension ?This is a chronic problem. The current episode started more than 1 year ago. The problem has been gradually improving since onset. The problem is controlled. Pertinent negatives include no blurred vision. Risk factors for coronary artery disease include dyslipidemia, post-menopausal state, sedentary lifestyle and stress. Past treatments include beta blockers and diuretics. The current treatment provides moderate improvement.   ? ?Past Medical History:  ?Diagnosis Date  ? Aortic atherosclerosis ([place]) 04/16/2021  ? Arthritis   ? Cataract   ? Diverticulosis   ? H/O blood transfusion reaction 2010  ? hives  ? H/O migraine   ? last 94yr ago  ? Headache(784.0)   ? Heart murmur   ? takes Bystolic nightly  ? History of blood clots 459yr ? was on Lovenox injections and Coumadin(was only on that for short period of time)  ? History of shingles 3-4y67yrgo  ? Hx of seasonal allergies   ? takes Levocetirizine prn  ? Hyperlipidemia   ? takes Crestor nightly  ? Hypertension   ? takes Maxzide daily  ? Insomnia   ? takes Ambien prn   ? Joint pain   ? Joint swelling   ? Moderate tricuspid regurgitation   ? Pre-diabetes   ? Trigeminal neuralgia of right side of face 03/26/2016  ? V2 distribution  ?  ? ?Family History  ?Problem Relation Age of Onset  ? Heart attack Mother   ? Alzheimer's disease Mother   ? Diabetes Sister   ? Hypertension Sister   ? ? ? ?Current Outpatient Medications:  ?  ondansetron (ZOFRAN-ODT) 4 MG disintegrating tablet, Take 1 tablet (4 mg total) by mouth every 8 (eight) hours as needed for nausea or vomiting., Disp: 20 tablet, Rfl: 0 ?  acetaminophen (TYLENOL) 325 MG tablet, Take 650 mg by mouth every 6 (six) hours as needed for headache., Disp: , Rfl:  ?  Cholecalciferol (DIALYVITE VITAMIN D 5000) 125 MCG (5000 UT) capsule, Take 5,000 Units by mouth daily., Disp: , Rfl:  ?  methocarbamol (ROBAXIN) 500 MG tablet, Take 1 tablet by mouth as needed., Disp: , Rfl:  ?  metoprolol succinate (TOPROL-XL) 25 MG 24 hr tablet, TAKE 1/2 TABLET BY MOUTH EVERY EVENING UNTIL FURTHER INSTRUCTIONS (Patient not taking: Reported on 02/08/2022), Disp: 30 tablet, Rfl: 1 ?  nebivolol (BYSTOLIC) 5 MG tablet, TAKE 1 TABLET DAILY, Disp: 90 tablet, Rfl: 3 ?  pregabalin (LYRICA) 75 MG capsule, Take 1 capsule (75 mg total) by mouth daily., Disp: 30 capsule, Rfl: 0 ?  rosuvastatin (CRESTOR) 10 MG tablet, TAKE  1 TABLET DAILY, Disp: 90 tablet, Rfl: 3 ?  SYSTANE 0.4-0.3 % GEL ophthalmic gel, SMARTSIG:1 Drop(s) In Eye(s) PRN, Disp: , Rfl:  ?  tiZANidine (ZANAFLEX) 2 MG tablet, , Disp: , Rfl:  ?  traMADol (ULTRAM) 50 MG tablet, Take 1 tablet (50 mg total) by mouth every 6 (six) hours as needed. (Patient not taking: Reported on 02/08/2022), Disp: 20 tablet, Rfl: 0 ?  triamterene-hydrochlorothiazide (MAXZIDE-25) 37.5-25 MG tablet, Take 1 tablet by mouth daily., Disp: 90 tablet, Rfl: 1 ?  zolpidem (AMBIEN) 5 MG tablet, Take 1 tablet (5 mg total) by mouth at bedtime as needed. for sleep, Disp: 60 tablet, Rfl: 1  ? ?Allergies  ?Allergen Reactions  ? Aspirin  Anaphylaxis  ? Quinapril Hcl Anaphylaxis  ? Ibuprofen Hives and Swelling  ? Penicillins Hives and Swelling  ?  Has patient had a PCN reaction causing immediate rash, facial/tongue/throat swelling, SOB or lightheadedness with hypotension: Yes ?Has patient had a PCN reaction causing severe rash involving mucus membranes or skin necrosis: No ?Has patient had a PCN reaction that required hospitalization: No ?Has patient had a PCN reaction occurring within the last 10 years: No ?If all of the above answers are "NO", then may proceed with Cephalosporin use. ?Tolerated Cephalosporin Date: 12/13/20. ? ?  ? Pravastatin Rash  ?  ? ?Review of Systems  ?Constitutional: Negative.   ?Eyes:  Negative for blurred vision.  ?Respiratory: Negative.    ?Cardiovascular: Negative.   ?Gastrointestinal:  Positive for nausea.  ?Genitourinary:  Positive for urgency.  ?Neurological: Negative.    ? ?Today's Vitals  ? 02/08/22 0902  ?BP: 102/60  ?Pulse: 87  ?Temp: 97.9 ?F (36.6 ?C)  ?TempSrc: Oral  ?Weight: 165 lb (74.8 kg)  ?Height: 5' 0.8" (1.544 m)  ? ?Body mass index is 31.38 kg/m?.  ?Wt Readings from Last 3 Encounters:  ?02/08/22 165 lb (74.8 kg)  ?02/08/22 165 lb 9.6 oz (75.1 kg)  ?09/28/21 161 lb 8 oz (73.3 kg)  ? ? ?Objective:  ?Physical Exam ?Vitals and nursing note reviewed.  ?Constitutional:   ?   Appearance: Normal appearance.  ?HENT:  ?   Head: Normocephalic and atraumatic.  ?Eyes:  ?   Extraocular Movements: Extraocular movements intact.  ?Cardiovascular:  ?   Rate and Rhythm: Normal rate and regular rhythm.  ?   Heart sounds: Normal heart sounds.  ?Pulmonary:  ?   Effort: Pulmonary effort is normal.  ?   Breath sounds: Normal breath sounds.  ?Musculoskeletal:  ?   Cervical back: Normal range of motion.  ?Skin: ?   General: Skin is warm.  ?Neurological:  ?   General: No focal deficit present.  ?   Mental Status: She is alert.  ?Psychiatric:     ?   Mood and Affect: Mood normal.     ?   Behavior: Behavior normal.  ?  ? ?    ?Assessment And Plan:  ?   ?1. Essential hypertension ?Comments: Chronic, well controlled. No med changes, encouraged to follow low sodium diet.  ?- Lipid panel ?- CMP14+EGFR ?- CBC no Diff ? ?2. Prediabetes ?Comments: Her a1c has been elevated in the past, I will recheck this today. She is encouraged to limit her intake of sweetened beverages.  ?- Hemoglobin A1c ?- CMP14+EGFR ? ?3. Atherosclerosis of aorta (HCC) ?Comments: Unable to take ASA due to allergy. She is currently on statin therapy, encouraged to avoid fried foods and live heart healthy lifestyle.  ? ?4. Pure hypercholesterolemia ?  Comments: Chronic, she is on rosuvastatin 72m daily. LDL goal <100. ? ?5. Urge incontinence ?Comments: I will check urinalysis today. She is encouraged to perform pelvic floor exercises.  ?- POCT Urinalysis Dipstick (81002) ? ?6. Nausea ?Comments: Sx are suggestive of reflux. I will send rx Zofran-ODT to use prn. She is advised to stop eating 3 hrs before bedtime, will let me know if her sx persist.  ? ?7. Class 1 obesity due to excess calories with serious comorbidity and body mass index (BMI) of 31.0 to 31.9 in adult ?Comments: She is encouraged to aim for at least 150 minutes of exercise/week, while striving for BMI<30 to decrease cardiac risk.  ? ?Patient was given opportunity to ask questions. Patient verbalized understanding of the plan and was able to repeat key elements of the plan. All questions were answered to their satisfaction.  ? ?I, RMaximino Greenland MD, have reviewed all documentation for this visit. The documentation on 02/08/22 for the exam, diagnosis, procedures, and orders are all accurate and complete. c ? ?IF YOU HAVE BEEN REFERRED TO A SPECIALIST, IT MAY TAKE 1-2 WEEKS TO SCHEDULE/PROCESS THE REFERRAL. IF YOU HAVE NOT HEARD FROM US/SPECIALIST IN TWO WEEKS, PLEASE GIVE [facility] CALL AT [phone].  ? ?THE PATIENT IS ENCOURAGED TO PRACTICE SOCIAL DISTANCING DUE TO THE COVID-19 PANDEMIC.   ?

## 2022-02-08 NOTE — Addendum Note (Signed)
Addended by: Kellie Simmering on: 02/08/2022 10:03 AM ? ? Modules accepted: Orders ? ?

## 2022-02-08 NOTE — Progress Notes (Signed)
?This visit occurred during the SARS-CoV-2 public health emergency.  Safety protocols were in place, including screening questions prior to the visit, additional usage of staff PPE, and extensive cleaning of exam room while observing appropriate contact time as indicated for disinfecting solutions. ? ?Subjective:  ? [name] is a 71 y.o. female who presents for Medicare Annual (Subsequent) preventive examination. ? ?Review of Systems    ? ?Cardiac Risk Factors include: advanced age (>71mn, >>40women);dyslipidemia;hypertension;obesity (BMI >30kg/m2) ? ?   ?Objective:  ?  ?Today's Vitals  ? 02/08/22 0852  ?BP: 102/60  ?Pulse: 87  ?Temp: 97.9 ?F (36.6 ?C)  ?TempSrc: Oral  ?SpO2: 95%  ?Weight: 165 lb 9.6 oz (75.1 kg)  ?Height: 5' 0.8" (1.544 m)  ? ?Body mass index is 31.5 kg/m?. ? ? ?  02/08/2022  ?  9:01 AM 07/03/2021  ?  7:53 PM 04/16/2021  ?  7:36 AM 04/15/2021  ?  9:55 AM 01/25/2021  ?  3:21 PM 12/13/2020  ? 11:28 AM 12/07/2020  ? 10:57 AM  ?Advanced Directives  ?Does Patient Have a Medical Advance Directive? No No  No No No No  ?Would patient like information on creating a medical advance directive? No - Patient declined No - Patient declined No - Patient declined   No - Patient declined   ? ? ?Current Medications (verified) ?Outpatient Encounter Medications as of 02/08/2022  ?Medication Sig  ? acetaminophen (TYLENOL) 325 MG tablet Take 650 mg by mouth every 6 (six) hours as needed for headache.  ? Cholecalciferol (DIALYVITE VITAMIN D 5000) 125 MCG (5000 UT) capsule Take 5,000 Units by mouth daily.  ? methocarbamol (ROBAXIN) 500 MG tablet Take 1 tablet by mouth as needed.  ? nebivolol (BYSTOLIC) 5 MG tablet TAKE 1 TABLET DAILY  ? rosuvastatin (CRESTOR) 10 MG tablet TAKE 1 TABLET DAILY  ? SYSTANE 0.4-0.3 % GEL ophthalmic gel SMARTSIG:1 Drop(s) In Eye(s) PRN  ? triamterene-hydrochlorothiazide (MAXZIDE-25) 37.5-25 MG tablet Take 1 tablet by mouth daily.  ? zolpidem (AMBIEN) 5 MG tablet Take 1 tablet (5 mg total) by  mouth at bedtime as needed. for sleep  ? metoprolol succinate (TOPROL-XL) 25 MG 24 hr tablet TAKE 1/2 TABLET BY MOUTH EVERY EVENING UNTIL FURTHER INSTRUCTIONS (Patient not taking: Reported on 02/08/2022)  ? pregabalin (LYRICA) 75 MG capsule Take 1 capsule (75 mg total) by mouth daily.  ? tiZANidine (ZANAFLEX) 2 MG tablet  (Patient not taking: Reported on 02/08/2022)  ? traMADol (ULTRAM) 50 MG tablet Take 1 tablet (50 mg total) by mouth every 6 (six) hours as needed. (Patient not taking: Reported on 02/08/2022)  ? ?No facility-administered encounter medications on file as of 02/08/2022.  ? ? ?Allergies (verified) ?Ibuprofen and Penicillins  ? ?History: ?Past Medical History:  ?Diagnosis Date  ? Aortic atherosclerosis ([place] 04/16/2021  ? Arthritis   ? Cataract   ? Diverticulosis   ? H/O blood transfusion reaction 2010  ? hives  ? H/O migraine   ? last 266yrago  ? Headache(784.0)   ? Heart murmur   ? takes Bystolic nightly  ? History of blood clots 4y33yr? was on Lovenox injections and Coumadin(was only on that for short period of time)  ? History of shingles 3-44yr63yro  ? Hx of seasonal allergies   ? takes Levocetirizine prn  ? Hyperlipidemia   ? takes Crestor nightly  ? Hypertension   ? takes Maxzide daily  ? Insomnia   ? takes Ambien prn  ? Joint  pain   ? Joint swelling   ? Moderate tricuspid regurgitation   ? Pre-diabetes   ? Trigeminal neuralgia of right side of face 03/26/2016  ? V2 distribution  ? ?Past Surgical History:  ?Procedure Laterality Date  ? ABDOMINAL HYSTERECTOMY    ? BREAST BIOPSY  05/03/2006  ? [place]  ? BREAST REDUCTION SURGERY  1987  ? BUNIONECTOMY Right 05/11/2019  ? CATARACT EXTRACTION Left 07/2019  ? CHOLECYSTECTOMY    ? COLONOSCOPY    ? ESOPHAGOGASTRODUODENOSCOPY    ? EYE SURGERY Right   ? eye lash removed  ? JOINT REPLACEMENT    ? bilateral hip rt in 2007 and lt in 2010  ? NM MYOCAR PERF WALL MOTION  11/04/2008  ? Normal  ? REDUCTION MAMMAPLASTY Bilateral 1988  ? REDUCTION MAMMAPLASTY    ? SHOULDER  ARTHROSCOPY WITH DISTAL CLAVICLE RESECTION Right 09/21/2015  ? Procedure: RIGHT SHOULDER ARTHROSCOPY WITH DISTAL CLAVICLE EXCISION DEBRIDE LABRAL TEAR ACROMIOPLASTY ;  Surgeon: Frederik Pear, MD;  Location: [facility];  Service: Orthopedics;  Laterality: Right;  ? TEE WITHOUT CARDIOVERSION N/A 04/12/2015  ? Procedure: TRANSESOPHAGEAL ECHOCARDIOGRAM (TEE)/BUBBLE STUDY;  Surgeon: Adrian Prows, MD;  Location: [facility];  Service: Cardiovascular;  Laterality: N/A;  ? TOTAL HIP REVISION  04/14/2012  ? Procedure: TOTAL HIP REVISION;  Surgeon: Kerin Salen, MD;  Location: [facility];  Service: Orthopedics;  Laterality: Right;  right total hip revision  ? TOTAL HIP REVISION Left 11/30/2013  ? Procedure: TOTAL HIP REVISION;  Surgeon: Kerin Salen, MD;  Location: [facility];  Service: Orthopedics;  Laterality: Left;  ? TOTAL KNEE ARTHROPLASTY Right 12/13/2020  ? Procedure: TOTAL KNEE ARTHROPLASTY;  Surgeon: Paralee Cancel, MD;  Location: [facility];  Service: Orthopedics;  Laterality: Right;  70 mins  ? ?Family History  ?Problem Relation Age of Onset  ? Heart attack Mother   ? Alzheimer's disease Mother   ? Diabetes Sister   ? Hypertension Sister   ? ?Social History  ? ?Socioeconomic History  ? Marital status: Married  ?  Spouse name: Not on file  ? Number of children: 2  ? Years of education: 58  ? Highest education level: Not on file  ?Occupational History  ? Occupation: Retired  ?Tobacco Use  ? Smoking status: Former  ?  Packs/day: 0.25  ?  Years: 42.00  ?  Pack years: 10.50  ?  Types: Cigarettes  ?  Quit date: 10/22/2018  ?  Years since quitting: 3.3  ? Smokeless tobacco: Never  ?Vaping Use  ? Vaping Use: Never used  ?Substance and Sexual Activity  ? Alcohol use: Not Currently  ?  Comment: lst dirnk 2 years ago   ? Drug use: No  ? Sexual activity: Not Currently  ?  Birth control/protection: Surgical  ?Other Topics Concern  ? Not on file  ?Social History Narrative  ? Lives at home w/ her husband  ? Right-handed  ? About 1 cup of  coffee every other day  ? ?Social Determinants of Health  ? ?Financial Resource Strain: Medium Risk  ? Difficulty of Paying Living Expenses: Somewhat hard  ?Food Insecurity: No Food Insecurity  ? Worried About Charity fundraiser in the Last Year: Never true  ? Ran Out of Food in the Last Year: Never true  ?Transportation Needs: No Transportation Needs  ? Lack of Transportation (Medical): No  ? Lack of Transportation (Non-Medical): No  ?Physical Activity: Inactive  ? Days of Exercise per Week:  0 days  ? Minutes of Exercise per Session: 0 min  ?Stress: Stress Concern Present  ? Feeling of Stress : To some extent  ?Social Connections: Not on file  ? ? ?Tobacco Counseling ?Counseling given: Not Answered ? ? ?Clinical Intake: ? ?Pre-visit preparation completed: Yes ? ?Pain : No/denies pain ? ?  ? ?Nutritional Status: BMI > 30  Obese ?Nutritional Risks: Nausea/ vomitting/ diarrhea (nausea for two weeks) ?Diabetes: No ? ?How often do you need to have someone help you when you read instructions, pamphlets, or other written materials from your doctor or pharmacy?: 1 - Never ?What is the last grade level you completed in school?: j[employer] college ? ?Diabetic? no ? ?Interpreter Needed?: No ? ?Information entered by :: NAllen LPN ? ? ?Activities of Daily Living ? ?  02/08/2022  ?  9:01 AM 04/15/2021  ?  5:29 PM  ?In your present state of health, do you have any difficulty performing the following activities:  ?Hearing? 0   ?Vision? 0   ?Difficulty concentrating or making decisions? 1   ?Comment ocassionally   ?Walking or climbing stairs? 0   ?Dressing or bathing? 0   ?Doing errands, shopping? 0 0  ?Preparing Food and eating ? N   ?Using the Toilet? N   ?In the past six months, have you accidently leaked urine? N   ?Do you have problems with loss of bowel control? N   ?Managing your Medications? N   ?Managing your Finances? N   ?Housekeeping or managing your Housekeeping? N   ? ? ?Patient Care Team: ?Glendale Chard, MD as PCP - General  (Internal Medicine) ?Debbra Riding, MD as Consulting Physician (Ophthalmology) ? ?Indicate any recent Medical Services you may have received from other than [facility] providers in the past year (date may

## 2022-02-09 ENCOUNTER — Telehealth: Payer: Self-pay

## 2022-02-09 LAB — CMP14+EGFR
ALT: 16 IU/L (ref 0–32)
AST: 25 IU/L (ref 0–40)
Albumin/Globulin Ratio: 1.7 (ref 1.2–2.2)
Albumin: 4.5 g/dL (ref 3.7–4.7)
Alkaline Phosphatase: 79 IU/L (ref 44–121)
BUN/Creatinine Ratio: 21 (ref 12–28)
BUN: 17 mg/dL (ref 8–27)
Bilirubin Total: 0.3 mg/dL (ref 0.0–1.2)
CO2: 22 mmol/L (ref 20–29)
Calcium: 9.5 mg/dL (ref 8.7–10.3)
Chloride: 102 mmol/L (ref 96–106)
Creatinine, Ser: 0.8 mg/dL (ref 0.57–1.00)
Globulin, Total: 2.6 g/dL (ref 1.5–4.5)
Glucose: 80 mg/dL (ref 70–99)
Potassium: 4.3 mmol/L (ref 3.5–5.2)
Sodium: 142 mmol/L (ref 134–144)
Total Protein: 7.1 g/dL (ref 6.0–8.5)
eGFR: 78 mL/min/{1.73_m2} (ref >59)

## 2022-02-09 LAB — CBC
Hematocrit: 49.3 % — ABNORMAL HIGH (ref 34.0–46.6)
Hemoglobin: 15.5 g/dL (ref 11.1–15.9)
MCH: 26.9 pg (ref 26.6–33.0)
MCHC: 31.4 g/dL — ABNORMAL LOW (ref 31.5–35.7)
MCV: 86 fL (ref 79–97)
Platelets: 130 10*3/uL — ABNORMAL LOW (ref 150–450)
RBC: 5.76 x10E6/uL — ABNORMAL HIGH (ref 3.77–5.28)
RDW: 14 % (ref 11.7–15.4)
WBC: 4.1 10*3/uL (ref 3.4–10.8)

## 2022-02-09 LAB — LIPID PANEL
Chol/HDL Ratio: 2.2 ratio (ref 0.0–4.4)
Cholesterol, Total: 141 mg/dL (ref 100–199)
HDL: 63 mg/dL (ref >39)
LDL Chol Calc (NIH): 64 mg/dL (ref 0–99)
Triglycerides: 73 mg/dL (ref 0–149)
VLDL Cholesterol Cal: 14 mg/dL (ref 5–40)

## 2022-02-09 LAB — HEMOGLOBIN A1C
Est. average glucose Bld gHb Est-mCnc: 131 mg/dL
Hgb A1c MFr Bld: 6.2 % — ABNORMAL HIGH (ref 4.8–5.6)

## 2022-02-09 NOTE — Telephone Encounter (Signed)
? ?  Telephone encounter was:  Successful.  ?02/09/2022 ?Name: [name] MRN: [id number] DOB: 02-Aug-1951 ? ?[name] is a 71 y.o. year old female who is a primary care patient of Glendale Chard, MD . The community resource team was consulted for assistance with Financial Difficulties related to utilities ? ?Care guide performed the following interventions: Patient provided with information about care guide support team and interviewed to confirm resource needs. ? ?Follow Up Plan:   Patient was unable to talk and requested that I call her on Monday 02/12/22. ? ?Kirsta Probert, [place], CHC ?Care Guide  Embedded Care Coordination ?[facility]  ?[address] ?[place], [place] [address] ???[email or web address]  ?? [phone]   ?[email or web address] ?  ?

## 2022-02-13 ENCOUNTER — Telehealth: Payer: Self-pay

## 2022-02-13 NOTE — Telephone Encounter (Signed)
? ?  Telephone encounter was:  Successful.  ?02/13/2022 ?Name: LAURENCE FOLZ MRN: [id number] DOB: 1951/01/01 ? ?SHAKENA CALLARI is a 71 y.o. year old female who is a primary care patient of Glendale Chard, MD . The community resource team was consulted for assistance with Financial Difficulties related to utilities ? ?Care guide performed the following interventions: Spoke with patient verified email address sent information for utility assistance per patient request. Also mailed information. Letter saved in Epic. ? ?Follow Up Plan:  Care guide will follow up with patient by phone over the next 7 days ? ?Conny Situ, [place], CHC ?Care Guide  Embedded Care Coordination ?[facility]  ?[address] ?[place], [place] [address] ???[email or web address]  ?? [phone]   [email or web address] ?  ?

## 2022-02-14 ENCOUNTER — Telehealth: Payer: Self-pay

## 2022-02-14 NOTE — Telephone Encounter (Signed)
? ?  Telephone encounter was:  Successful.  ?02/14/2022 ?Name: SAVAHNA CASADOS MRN: [id number] DOB: [birth year] ? ?VANESHA ATHENS is a 71 y.o. year old female who is a primary care patient of Glendale Chard, MD . The community resource team was consulted for assistance with Financial Difficulties related to utilities ? ?Care guide performed the following interventions: Received email from patient confirming receipt of utility assistance information sent. ? ?Follow Up Plan:  No further follow up planned at this time. The patient has been provided with needed resources. ? ?Taiyo Kozma, [place], CHC ?Care Guide  Embedded Care Coordination ?[facility]  ?[address] ?[place], [place] [address] ???[email or web address]  ?? [phone]   ?[email or web address] ?  ?

## 2022-02-27 DIAGNOSIS — M79645 Pain in left finger(s): Secondary | ICD-10-CM | POA: Diagnosis not present

## 2022-02-27 DIAGNOSIS — M1812 Unilateral primary osteoarthritis of first carpometacarpal joint, left hand: Secondary | ICD-10-CM | POA: Diagnosis not present

## 2022-03-20 ENCOUNTER — Ambulatory Visit
Admission: RE | Admit: 2022-03-20 | Discharge: 2022-03-20 | Disposition: A | Discharge status: Home / Self Care | Payer: Medicare Other | Source: Ambulatory Visit | Attending: Internal Medicine | Admitting: Internal Medicine

## 2022-03-20 DIAGNOSIS — Z1231 Encounter for screening mammogram for malignant neoplasm of breast: Secondary | ICD-10-CM | POA: Diagnosis not present

## 2022-03-21 DIAGNOSIS — M1812 Unilateral primary osteoarthritis of first carpometacarpal joint, left hand: Secondary | ICD-10-CM | POA: Diagnosis not present

## 2022-04-23 ENCOUNTER — Other Ambulatory Visit: Payer: Self-pay | Admitting: Internal Medicine

## 2022-04-26 DIAGNOSIS — Z86010 Personal history of colonic polyps: Secondary | ICD-10-CM | POA: Diagnosis not present

## 2022-04-26 DIAGNOSIS — K5730 Diverticulosis of large intestine without perforation or abscess without bleeding: Secondary | ICD-10-CM | POA: Diagnosis not present

## 2022-04-26 DIAGNOSIS — R1011 Right upper quadrant pain: Secondary | ICD-10-CM | POA: Diagnosis not present

## 2022-04-26 DIAGNOSIS — R194 Change in bowel habit: Secondary | ICD-10-CM | POA: Diagnosis not present

## 2022-04-26 DIAGNOSIS — R110 Nausea: Secondary | ICD-10-CM | POA: Diagnosis not present

## 2022-04-26 DIAGNOSIS — E669 Obesity, unspecified: Secondary | ICD-10-CM | POA: Diagnosis not present

## 2022-04-30 ENCOUNTER — Other Ambulatory Visit: Payer: Self-pay | Admitting: Gastroenterology

## 2022-04-30 ENCOUNTER — Other Ambulatory Visit (HOSPITAL_COMMUNITY): Payer: Self-pay | Admitting: Gastroenterology

## 2022-04-30 DIAGNOSIS — R1011 Right upper quadrant pain: Secondary | ICD-10-CM

## 2022-05-03 ENCOUNTER — Ambulatory Visit (HOSPITAL_COMMUNITY)
Admission: RE | Admit: 2022-05-03 | Discharge: 2022-05-03 | Disposition: A | Discharge status: Home / Self Care | Payer: Medicare Other | Source: Ambulatory Visit | Attending: Gastroenterology | Admitting: Gastroenterology

## 2022-05-03 DIAGNOSIS — I700 Atherosclerosis of aorta: Secondary | ICD-10-CM | POA: Diagnosis not present

## 2022-05-03 DIAGNOSIS — R109 Unspecified abdominal pain: Secondary | ICD-10-CM | POA: Diagnosis not present

## 2022-05-03 DIAGNOSIS — R7303 Prediabetes: Secondary | ICD-10-CM

## 2022-05-03 DIAGNOSIS — R1011 Right upper quadrant pain: Secondary | ICD-10-CM

## 2022-05-03 MED ORDER — IOHEXOL 300 MG/ML  SOLN
100.0000 mL | Freq: Once | INTRAMUSCULAR | Status: AC | PRN
  Administered 2022-05-03: 100 mL via INTRAVENOUS

## 2022-05-15 ENCOUNTER — Ambulatory Visit (HOSPITAL_COMMUNITY)
Admission: RE | Admit: 2022-05-15 | Discharge: 2022-05-15 | Disposition: A | Discharge status: Home / Self Care | Payer: Medicare Other | Source: Ambulatory Visit | Attending: Podiatry | Admitting: Podiatry

## 2022-05-15 ENCOUNTER — Encounter: Payer: Self-pay | Admitting: Podiatry

## 2022-05-15 ENCOUNTER — Ambulatory Visit (INDEPENDENT_AMBULATORY_CARE_PROVIDER_SITE_OTHER): Payer: Medicare Other | Admitting: Podiatry

## 2022-05-15 DIAGNOSIS — M76822 Posterior tibial tendinitis, left leg: Secondary | ICD-10-CM

## 2022-05-15 MED ORDER — DEXAMETHASONE SODIUM PHOSPHATE 120 MG/30ML IJ SOLN
4.0000 mg | Freq: Once | INTRAMUSCULAR | Status: AC
  Administered 2022-05-15: 4 mg via INTRA_ARTICULAR

## 2022-05-15 NOTE — Progress Notes (Signed)
  Subjective:  Patient ID: [name], female    DOB: 30-Dec-1950,   MRN: [id number]  Chief Complaint  Patient presents with   Ankle Pain    Left ankle pain. Patient states that the pain radiates up her leg. 6/10 pain. Patients states that her ankle is swollen and red.     71 y.o. female presents for concern of left ankle and foot pain that started about a week ago. Relates it started around ankle and is radiating up her leg. Getting worse. Denies injury. Relates aching pain that is going all the way up the leg. No history of back pain.  . Denies any other pedal complaints. Denies n/v/f/c.   Past Medical History:  Diagnosis Date   Aortic atherosclerosis ([place]) 04/16/2021   Arthritis    Cataract    Diverticulosis    H/O blood transfusion reaction 2010   hives   H/O migraine    last 22yr ago   Headache(784.0)    Heart murmur    takes Bystolic nightly   History of blood clots 461yr  was on Lovenox injections and Coumadin(was only on that for short period of time)   History of shingles 3-4y67yrgo   Hx of seasonal allergies    takes Levocetirizine prn   Hyperlipidemia    takes Crestor nightly   Hypertension    takes Maxzide daily   Insomnia    takes Ambien prn   Joint pain    Joint swelling    Moderate tricuspid regurgitation    Pre-diabetes    Trigeminal neuralgia of right side of face 03/26/2016   V2 distribution    Objective:  Physical Exam: Vascular: DP/PT pulses 2/4 bilateral. CFT <3 seconds. Normal hair growth on digits. No edema.  Skin. No lacerations or abrasions bilateral feet.  Musculoskeletal: MMT 5/5 bilateral lower extremities in DF, PF, Inversion and Eversion. Deceased ROM in DF of ankle joint. Tender along PT tendon mostly behind medial malleolus and into insertion. She does have pain with DF PF and inversion. Has pain with calf squeeze as well and pain running up along medial calf.  Neurological: Sensation intact to light touch.   Assessment:   1.  Posterior tibial tendon dysfunction (PTTD) of left lower extremity      Plan:  Patient was evaluated and treated and all questions answered. X-rays reviewed and discussed with patient. Discussed PTTD diagnosis and treatment options with patient. Stretching exercises discussed and handout dispensed. Requesting injection today. Procedure below.  Tri-Lock ankle brace dispensed. Due to history of blood clots and pain in the calf will send for DVT ultrasound to rule out.  Discussed if there is no improvement PT/MRI/injection may be an option. Patient to return to clinic in 6 to 8 weeks or sooner if symptoms fail to improve or worsen.  Procedure: Injection Tendon/Ligament Discussed alternatives, risks, complications and verbal consent was obtained.  Location: Left Posterior tibial tendon. Skin Prep: Alcohol. Injectate: 1cc 0.5% marcaine plain, 1 cc dexamethasone.  Disposition: Patient tolerated procedure well. Injection site dressed with a band-aid.  Post-injection care was discussed and return precautions discussed.     RebLorenda PeckPM

## 2022-05-15 NOTE — Patient Instructions (Signed)
Posterior Tibial Tendon Tear Rehab Ask your health care provider which exercises are safe for you. Do exercises exactly as told by your health care provider and adjust them as directed. It is normal to feel mild stretching, pulling, tightness, or discomfort as you do these exercises. Stop right away if you feel sudden pain or your pain gets worse. Do not begin these exercises until told by your health care provider. Stretching and range-of-motion exercises These exercises warm up your muscles and joints and improve the movement and flexibility of your ankle. These exercises also help to relieve pain, numbness, and tingling. Gastroc stretch  Sit on the floor with your left / right leg extended. Loop a belt or towel around ball of your left / right foot. The ball of your foot is on the walking surface, right under your toes. Keep your left / right ankle and foot relaxed and keep your knee straight while you use the belt or towel to pull your foot and ankle toward you. You should feel a gentle stretch behind your calf or knee (gastrocnemius). Hold this position for __________ seconds. Repeat __________ times. Complete this exercise __________ times a day. Active ankle dorsiflexion and plantar flexion  Sit with your left / right knee straight or bent. Do not rest your foot on anything. Flex your left / right ankle to tilt the top of your foot toward your shin (dorsiflexion). Hold this position for __________ seconds. Point your toes downward to tilt the top of your foot away from your shin (plantar flexion). Hold this position for __________ seconds. Repeat __________ times with your knee straight and __________ times with your knee bent. Complete this exercise __________ times a day. Passive ankle plantar flexion  Sit with your left / right leg crossed over your opposite knee. With your left / right hand, pull the front of your foot and toes toward you (plantar flexion). You should feel a gentle  stretch on the top of your foot and ankle. Hold this position for __________ seconds. Repeat __________ times. Complete this exercise __________ times a day. Passive ankle eversion  Sit with your left / right ankle crossed over your opposite knee. With your left / right hand, hold your foot so that your thumb is on the top of your foot and your fingers are on the bottom of your foot. Gently push and twist your ankle downward (eversion) so the smallest toes rise slightly toward the ceiling. You should feel a gentle stretch on the inside of your ankle. Hold this stretch for __________ seconds. Repeat __________ times. Complete this exercise __________ times a day. Passive ankle inversion  Sit with your left / right ankle crossed over your opposite knee. With your left / right hand, hold your foot so that your thumb is on the bottom of your foot and your fingers are across the top of your foot. Gently pull and twist your foot so the smallest toe comes toward you (inversion). You should feel a gentle stretch on the outside of your ankle. Hold the stretch for __________ seconds. Repeat __________ times. Complete this exercise __________ times a day. Ankle alphabet  Sit with your left / right leg supported at the lower leg. Do not rest your foot on anything. Make sure your foot has room to move freely. Think of your left / right foot as a paintbrush, and move your foot to trace each letter of the alphabet in the air. Keep your hip and knee still while you   trace. Trace every letter of the alphabet. Repeat __________ times. Complete this exercise __________ times a day. Strengthening exercises These exercises build strength and endurance in your lower leg. Endurance is the ability to use your muscles for a long time, even after they get tired. Dorsiflexion  Secure a rubber exercise band or tube to an object that will not move if it is pulled on, such as a table leg. Secure the other end of the  band around your left / right foot. Sit on the floor, facing the object with your left / right leg extended. The band or tube should be slightly tense when your foot is relaxed. Slowly flex your left / right ankle and toes to bring your foot toward you (dorsiflexion). Hold this position for __________ seconds. Let the band or tube slowly pull your foot back to the starting position. Repeat __________ times. Complete this exercise __________ times a day. Plantar flexion while seated  Sit on the floor with your left / right leg extended. Loop a rubber exercise band or tube around the ball of your left / right foot. The ball of your foot is on the walking surface, right under your toes. The band or tube should be slightly tense when your foot is relaxed. Slowly point your toes downward, pushing them away from you (plantar flexion). Hold this position for __________ seconds. Let the band or tube slowly pull your foot back to the starting position. Repeat __________ times. Complete this exercise __________ times a day. Towel curls  Sit in a chair on a non-carpeted surface, and put your feet on the floor. Place a towel in front of your feet. If told by your health care provider, add __________ to the end of the towel. Keeping your heel on the floor, put your left / right foot on the towel. Pull the towel toward you by grabbing the towel with your toes and curling them under. Keep your heel on the floor. Repeat __________ times. Complete this exercise __________ times a day. This information is not intended to replace advice given to you by your health care provider. Make sure you discuss any questions you have with your health care provider. Document Revised: 01/30/2019 Document Reviewed: 11/26/2018 Elsevier Patient Education  2023 [employer]  

## 2022-05-15 NOTE — Progress Notes (Signed)
Left lower extremity venous duplex completed. Refer to "CV Proc" under chart review to view preliminary results.  05/15/2022 4:04 PM Kelby Aline., MHA, RVT, RDCS, RDMS

## 2022-05-22 ENCOUNTER — Other Ambulatory Visit: Payer: Self-pay | Admitting: Internal Medicine

## 2022-05-31 ENCOUNTER — Telehealth: Payer: Self-pay

## 2022-06-01 ENCOUNTER — Other Ambulatory Visit: Payer: Self-pay | Admitting: Podiatry

## 2022-06-01 ENCOUNTER — Telehealth: Payer: Self-pay | Admitting: Podiatry

## 2022-06-01 MED ORDER — METHYLPREDNISOLONE 4 MG PO TBPK: ORAL_TABLET | ORAL | 0 refills | Status: DC

## 2022-06-01 NOTE — Telephone Encounter (Signed)
Pt stated shes in a lot of pain her foot hurts and pain is moving up her ankle and leg. Pt stated theres swelling and redness and would like a Rx for pain however, pt is allergic to Ibuprofen.  Please advise

## 2022-06-01 NOTE — Telephone Encounter (Signed)
Please schedule patient for a sooner appointment to discuss other options, too soon for another injection

## 2022-06-05 ENCOUNTER — Ambulatory Visit (INDEPENDENT_AMBULATORY_CARE_PROVIDER_SITE_OTHER): Payer: Medicare Other | Admitting: Podiatry

## 2022-06-05 ENCOUNTER — Encounter: Payer: Self-pay | Admitting: Podiatry

## 2022-06-05 DIAGNOSIS — M76822 Posterior tibial tendinitis, left leg: Secondary | ICD-10-CM | POA: Diagnosis not present

## 2022-06-05 MED ORDER — TRAMADOL HCL 50 MG PO TABS: 50.0000 mg | ORAL_TABLET | Freq: Three times a day (TID) | ORAL | 0 refills | Status: AC | PRN

## 2022-06-05 NOTE — Progress Notes (Signed)
  Subjective:  Patient ID: [name], female    DOB: Mar 12, 1951,   MRN: [id number]  Chief Complaint  Patient presents with   Foot Pain    Rm 20 Left foot pain. Pt states pain has increased and now has spasms. Pt states she did not start taking Rx that was prescribed for foot pain.     71 y.o. female presents for follow-up of left PTTD. Relates her pain has worsened and now getting spasms in the foot. I had sent in medrol dose pack for her which she has not taken. Relates she was concerned about side effects. She also has allergies to anti-inflammatories so we are avoiding. . No history of back pain.  . Denies any other pedal complaints. Denies n/v/f/c.   Past Medical History:  Diagnosis Date   Aortic atherosclerosis ([place]) 04/16/2021   Arthritis    Cataract    Diverticulosis    H/O blood transfusion reaction 2010   hives   H/O migraine    last 23yr ago   Headache(784.0)    Heart murmur    takes Bystolic nightly   History of blood clots 42yr  was on Lovenox injections and Coumadin(was only on that for short period of time)   History of shingles 3-4y23yrgo   Hx of seasonal allergies    takes Levocetirizine prn   Hyperlipidemia    takes Crestor nightly   Hypertension    takes Maxzide daily   Insomnia    takes Ambien prn   Joint pain    Joint swelling    Moderate tricuspid regurgitation    Pre-diabetes    Trigeminal neuralgia of right side of face 03/26/2016   V2 distribution    Objective:  Physical Exam: Vascular: DP/PT pulses 2/4 bilateral. CFT <3 seconds. Normal hair growth on digits. No edema.  Skin. No lacerations or abrasions bilateral feet.  Musculoskeletal: MMT 5/5 bilateral lower extremities in DF, PF, Inversion and Eversion. Deceased ROM in DF of ankle joint. Tender along PT tendon mostly behind medial malleolus and into insertion. She does have pain with DF PF and inversion. Has pain with calf squeeze as well and pain running up along medial calf.   Neurological: Sensation intact to light touch.   Assessment:   1. Posterior tibial tendon dysfunction (PTTD) of left lower extremity       Plan:  Patient was evaluated and treated and all questions answered. X-rays reviewed and discussed with patient. Discussed PTTD diagnosis and treatment options with patient. Continue stretches.  Discussed CAM boot and offloading while walking. She has one at home that she can wear.  Tramadol refilled to see if this eases the pain.  Discussed if there is no improvement PT/MRI/injection may be an option. Patient to return to clinic in 4 weeks or sooner if symptoms fail to improve or worsen.    RebLorenda PeckPM

## 2022-06-15 ENCOUNTER — Ambulatory Visit: Payer: Medicare Other | Admitting: Podiatrist

## 2022-06-26 ENCOUNTER — Ambulatory Visit (INDEPENDENT_AMBULATORY_CARE_PROVIDER_SITE_OTHER): Payer: Medicare Other | Admitting: Podiatry

## 2022-06-26 ENCOUNTER — Encounter: Payer: Self-pay | Admitting: Podiatry

## 2022-06-26 DIAGNOSIS — M76822 Posterior tibial tendinitis, left leg: Secondary | ICD-10-CM

## 2022-06-26 NOTE — Progress Notes (Signed)
  Subjective:  Patient ID: [name], female    DOB: [birth year],   MRN: [id number]  Chief Complaint  Patient presents with   Follow-up    Return in about 6 weeks for posterior tibial tendon dysfunction- Patient stated she is feeling better. The constant pain has decreased. Using cam boot when walking for long period of time.     71 y.o. female presents for follow-up of left PTTD. Relates her pain is getting better and helps in the boot and uses that for long walks.  She also has allergies to anti-inflammatories so we are avoiding. . No history of back pain.  . Denies any other pedal complaints. Denies n/v/f/c.   Past Medical History:  Diagnosis Date   Aortic atherosclerosis ([place]) 04/16/2021   Arthritis    Cataract    Diverticulosis    H/O blood transfusion reaction 2010   hives   H/O migraine    last 70yr ago   Headache(784.0)    Heart murmur    takes Bystolic nightly   History of blood clots 89yr  was on Lovenox injections and Coumadin(was only on that for short period of time)   History of shingles 3-4y73yrgo   Hx of seasonal allergies    takes Levocetirizine prn   Hyperlipidemia    takes Crestor nightly   Hypertension    takes Maxzide daily   Insomnia    takes Ambien prn   Joint pain    Joint swelling    Moderate tricuspid regurgitation    Pre-diabetes    Trigeminal neuralgia of right side of face 03/26/2016   V2 distribution    Objective:  Physical Exam: Vascular: DP/PT pulses 2/4 bilateral. CFT <3 seconds. Normal hair growth on digits. No edema.  Skin. No lacerations or abrasions bilateral feet.  Musculoskeletal: MMT 5/5 bilateral lower extremities in DF, PF, Inversion and Eversion. Deceased ROM in DF of ankle joint. Tender along PT tendon mostly behind medial malleolus and into insertion. She does have pain with DF PF and inversion. Has pain with calf squeeze as well and pain running up along medial calf.  Neurological: Sensation intact to light touch.    Assessment:   1. Posterior tibial tendon dysfunction (PTTD) of left lower extremity        Plan:  Patient was evaluated and treated and all questions answered. X-rays reviewed and discussed with patient. Discussed PTTD diagnosis and treatment options with patient. Continue stretches.  Discussed continuing CAM boot as needed and may try to get into regular shoes.  Use brace as needed Discussed if there is no improvement PT/MRI/injection may be an option. Patient to return to clinic in 4 weeks or sooner if symptoms fail to improve or worsen.    RebLorenda PeckPM

## 2022-08-14 ENCOUNTER — Ambulatory Visit (INDEPENDENT_AMBULATORY_CARE_PROVIDER_SITE_OTHER): Payer: Medicare Other | Admitting: Internal Medicine

## 2022-08-14 ENCOUNTER — Encounter: Payer: Self-pay | Admitting: Internal Medicine

## 2022-08-14 VITALS — BP 110/78 | HR 62 | Temp 98.7°F | Ht 60.0 in | Wt 166.0 lb

## 2022-08-14 DIAGNOSIS — Z23 Encounter for immunization: Secondary | ICD-10-CM | POA: Diagnosis not present

## 2022-08-14 DIAGNOSIS — I119 Hypertensive heart disease without heart failure: Secondary | ICD-10-CM | POA: Diagnosis not present

## 2022-08-14 DIAGNOSIS — I2510 Atherosclerotic heart disease of native coronary artery without angina pectoris: Secondary | ICD-10-CM | POA: Diagnosis not present

## 2022-08-14 DIAGNOSIS — E559 Vitamin D deficiency, unspecified: Secondary | ICD-10-CM | POA: Insufficient documentation

## 2022-08-14 DIAGNOSIS — G44209 Tension-type headache, unspecified, not intractable: Secondary | ICD-10-CM | POA: Insufficient documentation

## 2022-08-14 DIAGNOSIS — M25572 Pain in left ankle and joints of left foot: Secondary | ICD-10-CM | POA: Diagnosis not present

## 2022-08-14 DIAGNOSIS — I700 Atherosclerosis of aorta: Secondary | ICD-10-CM | POA: Diagnosis not present

## 2022-08-14 MED ORDER — TRAMADOL HCL 50 MG PO TABS: 50.0000 mg | ORAL_TABLET | Freq: Two times a day (BID) | ORAL | 0 refills | Status: DC | PRN

## 2022-08-14 NOTE — Patient Instructions (Signed)
Hypertension, Adult ?Hypertension is another name for high blood pressure. High blood pressure forces your heart to work harder to pump blood. This can cause problems over time. ?There are two numbers in a blood pressure reading. There is a top number (systolic) over a bottom number (diastolic). It is best to have a blood pressure that is below 120/80. ?What are the causes? ?The cause of this condition is not known. Some other conditions can lead to high blood pressure. ?What increases the risk? ?Some lifestyle factors can make you more likely to develop high blood pressure: ?Smoking. ?Not getting enough exercise or physical activity. ?Being overweight. ?Having too much fat, sugar, calories, or salt (sodium) in your diet. ?Drinking too much alcohol. ?Other risk factors include: ?Having any of these conditions: ?Heart disease. ?Diabetes. ?High cholesterol. ?Kidney disease. ?Obstructive sleep apnea. ?Having a family history of high blood pressure and high cholesterol. ?Age. The risk increases with age. ?Stress. ?What are the signs or symptoms? ?High blood pressure may not cause symptoms. Very high blood pressure (hypertensive crisis) may cause: ?Headache. ?Fast or uneven heartbeats (palpitations). ?Shortness of breath. ?Nosebleed. ?Vomiting or feeling like you may vomit (nauseous). ?Changes in how you see. ?Very bad chest pain. ?Feeling dizzy. ?Seizures. ?How is this treated? ?This condition is treated by making healthy lifestyle changes, such as: ?Eating healthy foods. ?Exercising more. ?Drinking less alcohol. ?Your doctor may prescribe medicine if lifestyle changes do not help enough and if: ?Your top number is above 130. ?Your bottom number is above 80. ?Your personal target blood pressure may vary. ?Follow these instructions at home: ?Eating and drinking ? ?If told, follow the DASH eating plan. To follow this plan: ?Fill one half of your plate at each meal with fruits and vegetables. ?Fill one fourth of your plate  at each meal with whole grains. Whole grains include whole-wheat pasta, brown rice, and whole-grain bread. ?Eat or drink low-fat dairy products, such as skim milk or low-fat yogurt. ?Fill one fourth of your plate at each meal with low-fat (lean) proteins. Low-fat proteins include fish, chicken without skin, eggs, beans, and tofu. ?Avoid fatty meat, cured and processed meat, or chicken with skin. ?Avoid pre-made or processed food. ?Limit the amount of salt in your diet to less than 1,500 mg each day. ?Do not drink alcohol if: ?Your doctor tells you not to drink. ?You are pregnant, may be pregnant, or are planning to become pregnant. ?If you drink alcohol: ?Limit how much you have to: ?0-1 drink a day for women. ?0-2 drinks a day for men. ?Know how much alcohol is in your drink. In the [place] one drink equals one 12 oz bottle of beer (355 mL), one 5 oz glass of wine (148 mL), or one 1? oz glass of hard liquor (44 mL). ?Lifestyle ? ?Work with your doctor to stay at a healthy weight or to lose weight. Ask your doctor what the best weight is for you. ?Get at least 30 minutes of exercise that causes your heart to beat faster (aerobic exercise) most days of the week. This may include walking, swimming, or biking. ?Get at least 30 minutes of exercise that strengthens your muscles (resistance exercise) at least 3 days a week. This may include lifting weights or doing Pilates. ?Do not smoke or use any products that contain nicotine or tobacco. If you need help quitting, ask your doctor. ?Check your blood pressure at home as told by your doctor. ?Keep all follow-up visits. ?Medicines ?Take over-the-counter and prescription medicines   only as told by your doctor. Follow directions carefully. ?Do not skip doses of blood pressure medicine. The medicine does not work as well if you skip doses. Skipping doses also puts you at risk for problems. ?Ask your doctor about side effects or reactions to medicines that you should watch  for. ?Contact a doctor if: ?You think you are having a reaction to the medicine you are taking. ?You have headaches that keep coming back. ?You feel dizzy. ?You have swelling in your ankles. ?You have trouble with your vision. ?Get help right away if: ?You get a very bad headache. ?You start to feel mixed up (confused). ?You feel weak or numb. ?You feel faint. ?You have very bad pain in your: ?Chest. ?Belly (abdomen). ?You vomit more than once. ?You have trouble breathing. ?These symptoms may be an emergency. Get help right away. Call 911. ?Do not wait to see if the symptoms will go away. ?Do not drive yourself to the hospital. ?Summary ?Hypertension is another name for high blood pressure. ?High blood pressure forces your heart to work harder to pump blood. ?For most people, a normal blood pressure is less than 120/80. ?Making healthy choices can help lower blood pressure. If your blood pressure does not get lower with healthy choices, you may need to take medicine. ?This information is not intended to replace advice given to you by your health care provider. Make sure you discuss any questions you have with your health care provider. ?Document Revised: 07/27/2021 Document Reviewed: 07/27/2021 ?Elsevier Patient Education ? 2023 [employer] ? ?

## 2022-08-14 NOTE — Progress Notes (Signed)
[name],acting as a Education administrator for Veronica Greenland, MD.,have documented all relevant documentation on the behalf of Veronica Greenland, MD,as directed by  Veronica Greenland, MD while in the presence of Veronica Greenland, MD.    Subjective:     Patient ID: [name] , female    DOB: 1951-02-05 , 71 y.o.   MRN: [id number]   Chief Complaint  Patient presents with   Hypertension    HPI  Patient presents today for a bp check, she reports compliance with meds. She denies having any chest pain, shortness of breath and palpitations.   Today, she c/o ankle, wrist, and back hurt for about 10 days. She has been evaluated for her wrist pain by Ortho. She received an injection. She adds that she has also been having really bad headaches for the past two weeks. It starts at the back of her head and radiates upward.   BP Readings from Last 3 Encounters: 08/14/22 : 110/78 02/08/22 : 102/60 02/08/22 : 102/60    Hypertension This is a chronic problem. The current episode started more than 1 year ago. The problem has been gradually improving since onset. The problem is controlled. Pertinent negatives include no blurred vision. Risk factors for coronary artery disease include dyslipidemia, post-menopausal state, sedentary lifestyle and stress. Past treatments include beta blockers and diuretics. The current treatment provides moderate improvement.     Past Medical History:  Diagnosis Date   Aortic atherosclerosis ([place]) 04/16/2021   Arthritis    Cataract    Diverticulosis    H/O blood transfusion reaction 2010   hives   H/O migraine    last 93yr ago   Headache(784.0)    Heart murmur    takes Bystolic nightly   History of blood clots 462yr  was on Lovenox injections and Coumadin(was only on that for short period of time)   History of shingles 3-4y44yrgo   Hx of seasonal allergies    takes Levocetirizine prn   Hyperlipidemia    takes Crestor nightly   Hypertension    takes Maxzide daily    Insomnia    takes Ambien prn   Joint pain    Joint swelling    Moderate tricuspid regurgitation    Pre-diabetes    Trigeminal neuralgia of right side of face 03/26/2016   V2 distribution     Family History  Problem Relation Age of Onset   Heart attack Mother    Alzheimer's disease Mother    Diabetes Sister    Hypertension Sister      Current Outpatient Medications:    acetaminophen (TYLENOL) 325 MG tablet, Take 650 mg by mouth every 6 (six) hours as needed for headache., Disp: , Rfl:    Cholecalciferol (DIALYVITE VITAMIN D 5000) 125 MCG (5000 UT) capsule, Take 5,000 Units by mouth daily., Disp: , Rfl:    methocarbamol (ROBAXIN) 500 MG tablet, Take 1 tablet by mouth 2 (two) times daily as needed., Disp: , Rfl:    methylPREDNISolone (MEDROL DOSEPAK) 4 MG TBPK tablet, Take as directed, Disp: 21 tablet, Rfl: 0   nebivolol (BYSTOLIC) 5 MG tablet, TAKE 1 TABLET DAILY, Disp: 90 tablet, Rfl: 3   ofloxacin (OCUFLOX) 0.3 % ophthalmic solution, INSTILL 1 DROP IN RIGHT EYE FOUR TIMES DAILY. START 1 DAY BEFORE SURGERY, Disp: , Rfl:    ondansetron (ZOFRAN-ODT) 4 MG disintegrating tablet, Take 1 tablet (4 mg total) by mouth every 8 (eight) hours as needed for nausea or  vomiting., Disp: 20 tablet, Rfl: 0   prednisoLONE acetate (PRED FORTE) 1 % ophthalmic suspension, SHAKE LIQUID AND INSTILL 1 DROP IN RIGHT EYE FOUR TIMES DAILY AFTER SURGERY, Disp: , Rfl:    rosuvastatin (CRESTOR) 10 MG tablet, TAKE 1 TABLET DAILY, Disp: 90 tablet, Rfl: 3   SYSTANE 0.4-0.3 % GEL ophthalmic gel, SMARTSIG:1 Drop(s) In Eye(s) PRN, Disp: , Rfl:    triamterene-hydrochlorothiazide (MAXZIDE-25) 37.5-25 MG tablet, TAKE 1 TABLET BY MOUTH DAILY, Disp: 90 tablet, Rfl: 1   zolpidem (AMBIEN) 5 MG tablet, Take 1 tablet (5 mg total) by mouth at bedtime as needed. for sleep, Disp: 60 tablet, Rfl: 1   pregabalin (LYRICA) 75 MG capsule, Take 1 capsule (75 mg total) by mouth daily. (Patient not taking: Reported on 08/14/2022), Disp: 30  capsule, Rfl: 0   tiZANidine (ZANAFLEX) 2 MG tablet, , Disp: , Rfl:    traMADol (ULTRAM) 50 MG tablet, Take 1 tablet (50 mg total) by mouth 2 (two) times daily as needed., Disp: 30 tablet, Rfl: 0   triamcinolone ointment (KENALOG) 0.1 %, APPLY TOPICALLY TO THE AFFECTED AREA TWICE DAILY (Patient not taking: Reported on 08/14/2022), Disp: , Rfl:    Allergies  Allergen Reactions   Aspirin Anaphylaxis   Quinapril Hcl Anaphylaxis   Ibuprofen Hives and Swelling   Penicillins Hives and Swelling    Has patient had a PCN reaction causing immediate rash, facial/tongue/throat swelling, SOB or lightheadedness with hypotension: Yes Has patient had a PCN reaction causing severe rash involving mucus membranes or skin necrosis: No Has patient had a PCN reaction that required hospitalization: No Has patient had a PCN reaction occurring within the last 10 years: No If all of the above answers are "NO", then may proceed with Cephalosporin use. Tolerated Cephalosporin Date: 12/13/20.     Pravastatin Rash     Review of Systems  Constitutional: Negative.   HENT: Negative.    Eyes: Negative.  Negative for blurred vision.  Respiratory: Negative.    Cardiovascular: Negative.   Gastrointestinal: Negative.   Musculoskeletal:  Positive for arthralgias.     Today's Vitals   08/14/22 1000  BP: 110/78  Pulse: 62  Temp: 98.7 F (37.1 C)  TempSrc: Oral  SpO2: 98%  Weight: 166 lb (75.3 kg)  Height: 5' (1.524 m)  PainSc: 6    Body mass index is 32.42 kg/m.  Wt Readings from Last 3 Encounters:  08/14/22 166 lb (75.3 kg)  02/08/22 165 lb (74.8 kg)  02/08/22 165 lb 9.6 oz (75.1 kg)    Objective:  Physical Exam Vitals and nursing note reviewed.  Constitutional:      Appearance: Normal appearance.  HENT:     Head: Normocephalic and atraumatic.     Nose:     Comments: Masked     Mouth/Throat:     Comments: Masked  Eyes:     Extraocular Movements: Extraocular movements intact.  Cardiovascular:      Rate and Rhythm: Normal rate and regular rhythm.     Heart sounds: Normal heart sounds.  Pulmonary:     Effort: Pulmonary effort is normal.     Breath sounds: Normal breath sounds.  Musculoskeletal:     Cervical back: Normal range of motion. Tenderness present.  Skin:    General: Skin is warm.  Neurological:     General: No focal deficit present.     Mental Status: She is alert.  Psychiatric:        Mood and Affect: Mood normal.  Behavior: Behavior normal.       Assessment And Plan:     1. Hypertensive heart disease without heart failure Comments: Chronic, well controlled.  She will c/w nebivolol and triamterene/hctz. May need to switch to metoprolol given coronary atherosclerosis.  - CMP14+EGFR - CBC no Diff  2. Coronary artery disease involving native coronary artery of native heart without angina pectoris Comments: Recent CT abd/pelvis results reviewed, significant for coronary calcifications. She hasn't seen Cardiology recently, she agrees to referral. She is encouraged to follow [place]. Will consider PREP referral.  - Ambulatory referral to Cardiology  3. Aortic atherosclerosis ([facility]) Comments: Chronic, LDL goal is less than 70. She will continue with rosuvastatin 62m daily. LDL 64 in April 2023, she will continue with current meds. She has upcoming Echo, but no Cardiology visit scheduled. She agrees to referral so appointment can be scheduled.  - Ambulatory referral to Cardiology.   4. Tension headache Comments: She would likely benefit from Mg supplementation, will use topical pain rub on affected areas nightly. She will let me know if her sx persist.  - CBC no Diff  5. Arthralgia of left ankle Comments: I will check an arthritis panel and vitamin D level. She should follow an anti-inflammatory diet. May benefit from cooking with turmeric/ginger. I will refer to - ANA, IFA (with reflex) - CYCLIC CITRUL PEPTIDE ANTIBODY, IGG/IGA - Rheumatoid  factor - Sedimentation rate - Uric acid - traMADol (ULTRAM) 50 MG tablet; Take 1 tablet (50 mg total) by mouth 2 (two) times daily as needed.  Dispense: 30 tablet; Refill: 0  6. Vitamin D deficiency Comments: I will check vitamin D level and supplement as needed. She is aware that low vitamin D could be contributing to her sx.  - Vitamin D (25 hydroxy)  7. Need for influenza vaccination - Flu Vaccine QUAD High Dose(Fluad)   Patient was given opportunity to ask questions. Patient verbalized understanding of the plan and was able to repeat key elements of the plan. All questions were answered to their satisfaction.   I, RMaximino Greenland MD, have reviewed all documentation for this visit. The documentation on 08/14/22 for the exam, diagnosis, procedures, and orders are all accurate and complete.   IF YOU HAVE BEEN REFERRED TO A SPECIALIST, IT MAY TAKE 1-2 WEEKS TO SCHEDULE/PROCESS THE REFERRAL. IF YOU HAVE NOT HEARD FROM US/SPECIALIST IN TWO WEEKS, PLEASE GIVE [place] CALL AT [phone].   THE PATIENT IS ENCOURAGED TO PRACTICE SOCIAL DISTANCING DUE TO THE COVID-19 PANDEMIC.

## 2022-08-16 LAB — CBC
Hematocrit: 43.9 % (ref 34.0–46.6)
Hemoglobin: 13.3 g/dL (ref 11.1–15.9)
MCH: 25.9 pg — ABNORMAL LOW (ref 26.6–33.0)
MCHC: 30.3 g/dL — ABNORMAL LOW (ref 31.5–35.7)
MCV: 86 fL (ref 79–97)
Platelets: 141 10*3/uL — ABNORMAL LOW (ref 150–450)
RBC: 5.13 x10E6/uL (ref 3.77–5.28)
RDW: 14.1 % (ref 11.7–15.4)
WBC: 4.7 10*3/uL (ref 3.4–10.8)

## 2022-08-16 LAB — CMP14+EGFR
ALT: 8 IU/L (ref 0–32)
AST: 19 IU/L (ref 0–40)
Albumin/Globulin Ratio: 1.8 (ref 1.2–2.2)
Albumin: 4.2 g/dL (ref 3.8–4.8)
Alkaline Phosphatase: 71 IU/L (ref 44–121)
BUN/Creatinine Ratio: 21 (ref 12–28)
BUN: 15 mg/dL (ref 8–27)
Bilirubin Total: 0.2 mg/dL (ref 0.0–1.2)
CO2: 21 mmol/L (ref 20–29)
Calcium: 9.4 mg/dL (ref 8.7–10.3)
Chloride: 104 mmol/L (ref 96–106)
Creatinine, Ser: 0.71 mg/dL (ref 0.57–1.00)
Globulin, Total: 2.4 g/dL (ref 1.5–4.5)
Glucose: 88 mg/dL (ref 70–99)
Potassium: 4.1 mmol/L (ref 3.5–5.2)
Sodium: 142 mmol/L (ref 134–144)
Total Protein: 6.6 g/dL (ref 6.0–8.5)
eGFR: 90 mL/min/{1.73_m2} (ref >59)

## 2022-08-16 LAB — CYCLIC CITRUL PEPTIDE ANTIBODY, IGG/IGA: Cyclic Citrullin Peptide Ab: 1 units (ref 0–19)

## 2022-08-16 LAB — RHEUMATOID FACTOR: Rheumatoid fact SerPl-aCnc: <10 IU/mL (ref <14.0)

## 2022-08-16 LAB — URIC ACID: Uric Acid: 7.1 mg/dL (ref 3.1–7.9)

## 2022-08-16 LAB — ANTINUCLEAR ANTIBODIES, IFA: ANA Titer 1: NEGATIVE

## 2022-08-16 LAB — VITAMIN D 25 HYDROXY (VIT D DEFICIENCY, FRACTURES): Vit D, 25-Hydroxy: 58.2 ng/mL (ref 30.0–100.0)

## 2022-08-16 LAB — SEDIMENTATION RATE: Sed Rate: 25 mm/hr (ref 0–40)

## 2022-08-20 ENCOUNTER — Other Ambulatory Visit: Payer: Self-pay

## 2022-08-20 MED ORDER — TRIAMTERENE-HCTZ 37.5-25 MG PO TABS: 1.0000 | ORAL_TABLET | Freq: Every day | ORAL | 1 refills | Status: DC

## 2022-09-04 ENCOUNTER — Other Ambulatory Visit: Payer: Self-pay | Admitting: Internal Medicine

## 2022-09-04 MED ORDER — ZOLPIDEM TARTRATE 5 MG PO TABS: 5.0000 mg | ORAL_TABLET | Freq: Every evening | ORAL | 2 refills | Status: DC | PRN

## 2022-09-10 DIAGNOSIS — M79672 Pain in left foot: Secondary | ICD-10-CM | POA: Diagnosis not present

## 2022-09-10 DIAGNOSIS — L84 Corns and callosities: Secondary | ICD-10-CM | POA: Diagnosis not present

## 2022-09-20 ENCOUNTER — Ambulatory Visit: Payer: Medicare Other | Admitting: Podiatry

## 2022-09-28 ENCOUNTER — Other Ambulatory Visit: Payer: Medicare Other

## 2022-10-09 ENCOUNTER — Ambulatory Visit: Payer: Medicare Other | Admitting: Student

## 2022-10-09 ENCOUNTER — Ambulatory Visit: Payer: Medicare Other | Admitting: Internal Medicine

## 2022-10-10 DIAGNOSIS — M25572 Pain in left ankle and joints of left foot: Secondary | ICD-10-CM | POA: Diagnosis not present

## 2022-10-10 DIAGNOSIS — L84 Corns and callosities: Secondary | ICD-10-CM | POA: Diagnosis not present

## 2022-10-10 DIAGNOSIS — M67962 Unspecified disorder of synovium and tendon, left lower leg: Secondary | ICD-10-CM | POA: Diagnosis not present

## 2022-10-10 DIAGNOSIS — M79672 Pain in left foot: Secondary | ICD-10-CM | POA: Diagnosis not present

## 2022-10-27 ENCOUNTER — Other Ambulatory Visit: Payer: Self-pay | Admitting: Internal Medicine

## 2022-11-22 ENCOUNTER — Encounter: Payer: Self-pay | Admitting: Internal Medicine

## 2022-11-22 ENCOUNTER — Ambulatory Visit (INDEPENDENT_AMBULATORY_CARE_PROVIDER_SITE_OTHER): Payer: Medicare Other | Admitting: Internal Medicine

## 2022-11-22 VITALS — BP 118/74 | HR 62 | Temp 98.2°F | Ht 60.0 in | Wt 164.4 lb

## 2022-11-22 DIAGNOSIS — L309 Dermatitis, unspecified: Secondary | ICD-10-CM | POA: Diagnosis not present

## 2022-11-22 DIAGNOSIS — E6609 Other obesity due to excess calories: Secondary | ICD-10-CM | POA: Diagnosis not present

## 2022-11-22 DIAGNOSIS — E559 Vitamin D deficiency, unspecified: Secondary | ICD-10-CM

## 2022-11-22 DIAGNOSIS — I700 Atherosclerosis of aorta: Secondary | ICD-10-CM | POA: Diagnosis not present

## 2022-11-22 DIAGNOSIS — I2510 Atherosclerotic heart disease of native coronary artery without angina pectoris: Secondary | ICD-10-CM | POA: Diagnosis not present

## 2022-11-22 DIAGNOSIS — I119 Hypertensive heart disease without heart failure: Secondary | ICD-10-CM | POA: Diagnosis not present

## 2022-11-22 DIAGNOSIS — L853 Xerosis cutis: Secondary | ICD-10-CM

## 2022-11-22 DIAGNOSIS — R5383 Other fatigue: Secondary | ICD-10-CM

## 2022-11-22 DIAGNOSIS — F4321 Adjustment disorder with depressed mood: Secondary | ICD-10-CM | POA: Insufficient documentation

## 2022-11-22 DIAGNOSIS — I071 Rheumatic tricuspid insufficiency: Secondary | ICD-10-CM

## 2022-11-22 DIAGNOSIS — Z79899 Other long term (current) drug therapy: Secondary | ICD-10-CM

## 2022-11-22 DIAGNOSIS — R7303 Prediabetes: Secondary | ICD-10-CM

## 2022-11-22 DIAGNOSIS — D696 Thrombocytopenia, unspecified: Secondary | ICD-10-CM

## 2022-11-22 DIAGNOSIS — Z6832 Body mass index (BMI) 32.0-32.9, adult: Secondary | ICD-10-CM

## 2022-11-22 NOTE — Progress Notes (Signed)
I,[name],acting as a scribe for Veronica Greenland, MD.,have documented all relevant documentation on the behalf of Veronica Greenland, MD,as directed by  Veronica Greenland, MD while in the presence of Veronica Greenland, MD.    Subjective:     Patient ID: [name] , female    DOB: 03-14-1951 , 72 y.o.   MRN: [id number]   Chief Complaint  Patient presents with   Referral   Hypertension    HPI  Pt presents today for referrals. She would like referral to a psychologist. Unfortunately, her previous therapist no longer accepts her insurance. She is not feeling depressed, she just feels an improvement in her mood with talk therapy.   She also wants a referral to dermatologist. She intermittently experiences dry blotches on her arms, face & legs. She describes it as " turtle skin".  She is not sure what may have contributed to her sx.      Past Medical History:  Diagnosis Date   Aortic atherosclerosis ([place]) 04/16/2021   Arthritis    Cataract    Diverticulosis    H/O blood transfusion reaction 2010   hives   H/O migraine    last 22yr ago   Headache(784.0)    Heart murmur    takes Bystolic nightly   History of blood clots 89yr  was on Lovenox injections and Coumadin(was only on that for short period of time)   History of shingles 3-4y72yrgo   Hx of seasonal allergies    takes Levocetirizine prn   Hyperlipidemia    takes Crestor nightly   Hypertension    takes Maxzide daily   Insomnia    takes Ambien prn   Joint pain    Joint swelling    Moderate tricuspid regurgitation    Pre-diabetes    Trigeminal neuralgia of right side of face 03/26/2016   V2 distribution     Family History  Problem Relation Age of Onset   Heart attack Mother    Alzheimer's disease Mother    Diabetes Sister    Hypertension Sister      Current Outpatient Medications:    acetaminophen (TYLENOL) 325 MG tablet, Take 650 mg by mouth every 6 (six) hours as needed for headache., Disp: , Rfl:     Cholecalciferol (DIALYVITE VITAMIN D 5000) 125 MCG (5000 UT) capsule, Take 5,000 Units by mouth daily., Disp: , Rfl:    methocarbamol (ROBAXIN) 500 MG tablet, Take 1 tablet by mouth 2 (two) times daily as needed., Disp: , Rfl:    methylPREDNISolone (MEDROL DOSEPAK) 4 MG TBPK tablet, Take as directed, Disp: 21 tablet, Rfl: 0   nebivolol (BYSTOLIC) 5 MG tablet, TAKE 1 TABLET DAILY, Disp: 90 tablet, Rfl: 3   ondansetron (ZOFRAN-ODT) 4 MG disintegrating tablet, Take 1 tablet (4 mg total) by mouth every 8 (eight) hours as needed for nausea or vomiting., Disp: 20 tablet, Rfl: 0   rosuvastatin (CRESTOR) 10 MG tablet, TAKE 1 TABLET DAILY, Disp: 90 tablet, Rfl: 3   triamterene-hydrochlorothiazide (MAXZIDE-25) 37.5-25 MG tablet, TAKE 1 TABLET BY MOUTH DAILY, Disp: 90 tablet, Rfl: 1   zolpidem (AMBIEN) 5 MG tablet, Take 1 tablet (5 mg total) by mouth at bedtime as needed. for sleep, Disp: 60 tablet, Rfl: 2   ofloxacin (OCUFLOX) 0.3 % ophthalmic solution, INSTILL 1 DROP IN RIGHT EYE FOUR TIMES DAILY. START 1 DAY BEFORE SURGERY (Patient not taking: Reported on 11/22/2022), Disp: , Rfl:    prednisoLONE acetate (PRED FORTE)  1 % ophthalmic suspension, SHAKE LIQUID AND INSTILL 1 DROP IN RIGHT EYE FOUR TIMES DAILY AFTER SURGERY (Patient not taking: Reported on 11/22/2022), Disp: , Rfl:    pregabalin (LYRICA) 75 MG capsule, Take 1 capsule (75 mg total) by mouth daily. (Patient not taking: Reported on 08/14/2022), Disp: 30 capsule, Rfl: 0   SYSTANE 0.4-0.3 % GEL ophthalmic gel, SMARTSIG:1 Drop(s) In Eye(s) PRN (Patient not taking: Reported on 11/22/2022), Disp: , Rfl:    tiZANidine (ZANAFLEX) 2 MG tablet, , Disp: , Rfl:    traMADol (ULTRAM) 50 MG tablet, Take 1 tablet (50 mg total) by mouth 2 (two) times daily as needed. (Patient not taking: Reported on 11/22/2022), Disp: 30 tablet, Rfl: 0   triamcinolone ointment (KENALOG) 0.1 %, APPLY TOPICALLY TO THE AFFECTED AREA TWICE DAILY (Patient not taking: Reported on 08/14/2022),  Disp: , Rfl:    Allergies  Allergen Reactions   Aspirin Anaphylaxis   Quinapril Hcl Anaphylaxis   Ibuprofen Hives and Swelling   Penicillins Hives and Swelling    Has patient had a PCN reaction causing immediate rash, facial/tongue/throat swelling, SOB or lightheadedness with hypotension: Yes Has patient had a PCN reaction causing severe rash involving mucus membranes or skin necrosis: No Has patient had a PCN reaction that required hospitalization: No Has patient had a PCN reaction occurring within the last 10 years: No If all of the above answers are "NO", then may proceed with Cephalosporin use. Tolerated Cephalosporin Date: 12/13/20.     Pravastatin Rash     Review of Systems  Constitutional:  Positive for fatigue.  Respiratory: Negative.    Cardiovascular: Negative.   Skin:  Positive for rash.  Neurological: Negative.   Psychiatric/Behavioral: Negative.       Today's Vitals   11/22/22 1042  BP: 118/74  Pulse: 62  Temp: 98.2 F (36.8 C)  SpO2: 98%  Weight: 164 lb 6.4 oz (74.6 kg)  Height: 5' (1.524 m)   Body mass index is 32.11 kg/m.  Wt Readings from Last 3 Encounters:  11/22/22 164 lb 6.4 oz (74.6 kg)  08/14/22 166 lb (75.3 kg)  02/08/22 165 lb (74.8 kg)    Objective:  Physical Exam Vitals and nursing note reviewed.  Constitutional:      Appearance: Normal appearance.  HENT:     Head: Normocephalic and atraumatic.  Cardiovascular:     Rate and Rhythm: Normal rate and regular rhythm.     Heart sounds: Murmur heard.  Pulmonary:     Effort: Pulmonary effort is normal.     Breath sounds: Normal breath sounds.  Skin:    General: Skin is dry.     Comments: Dry, flaky skin  Neurological:     General: No focal deficit present.     Mental Status: She is alert.  Psychiatric:        Mood and Affect: Mood normal.        Behavior: Behavior normal.      Assessment And Plan:     1. Adjustment disorder with depressed mood Comments: Her mood has improved,  occasionally despondent. I will refer her to Psychology for counseling services. - Ambulatory referral to Psychology  2. Dermatitis Comments: I will refer her to Derm for further evaluation.  3. Hypertensive heart disease without heart failure Comments: Chronic, well controlled. She is encouraged to follow low sodium diet. No med changes today. - BMP8+eGFR - TSH  4. Coronary artery disease involving native coronary artery of native heart without angina pectoris Comments:  Chronic, LDL goal <70. She will c/w rosuvastatin. Unable to take ASA due to allergy. - Ambulatory referral to Cardiology  5. Aortic atherosclerosis ([place]) Comments: Please see above. - Ambulatory referral to Cardiology  6. Moderate tricuspid regurgitation Comments: Chronic, I will refer her for repeat echocardiogram w/ PCV. I will also refer her to Cardiology for further evaluation. - ECHOCARDIOGRAM COMPLETE; Future - Ambulatory referral to Cardiology  7. Other fatigue Comments: She is encouraged to stay well hydrated. She may benefit from Mg nightly. I will check a vitamin B12 level today. - TSH  8. Prediabetes Comments: Her a1c has been elevated in the past. I will recheck this today. She is encouraged to limit her intake of refined  sugars, including sugary beverages.  9. Thrombocytopenia ([place]) Comments: I will recheck CBC today. She denies having any unusual bleeding. - CBC no Diff  10. Class 1 obesity due to excess calories with serious comorbidity and body mass index (BMI) of 32.0 to 32.9 in adult Comments: She is encouraged to incorporate more exercise into her daily routine, while aiming for BMI<30 to decrease cardiac risk.  11. Polypharmacy - Vitamin B12  Patient was given opportunity to ask questions. Patient verbalized understanding of the plan and was able to repeat key elements of the plan. All questions were answered to their satisfaction.   I, Veronica Greenland, MD, have reviewed all  documentation for this visit. The documentation on 11/28/22 for the exam, diagnosis, procedures, and orders are all accurate and complete.   IF YOU HAVE BEEN REFERRED TO A SPECIALIST, IT MAY TAKE 1-2 WEEKS TO SCHEDULE/PROCESS THE REFERRAL. IF YOU HAVE NOT HEARD FROM US/SPECIALIST IN TWO WEEKS, PLEASE GIVE [place] A CALL AT [phone].   THE PATIENT IS ENCOURAGED TO PRACTICE SOCIAL DISTANCING DUE TO THE COVID-19 PANDEMIC.

## 2022-11-22 NOTE — Patient Instructions (Addendum)
Cetaphil lotion Aquaphor cream/lotion  Depression Screening Depression screening is a tool that your health care provider can use to learn if you have symptoms of depression. Depression is a common condition with many symptoms that are also often found in other conditions. Depression is treatable, but it must first be diagnosed. You may not know that certain feelings, thoughts, and behaviors that you are having can be symptoms of depression. Taking a depression screening test can help you and your health care provider decide if you need more assessment, or if you should be referred to a mental health care provider. What are the screening tests? You may have a physical exam to see if another condition is affecting your mental health. You may have a blood or urine sample taken during the physical exam. You may be interviewed or offered a written test using a screening tool that was developed from research, such as one of these: Patient Health Questionnaire (PHQ). This is a set of either 2 or 9 questions. A health care provider who has been trained to score this screening test uses a guide to assess if your symptoms suggest that you may have depression. Hamilton Depression Rating Scale (HAM-D). This is a set of either 17 or 24 questions. You may be asked to take it again during or after your treatment, to see if your depression has gotten better. Beck Depression Inventory (BDI). This is a set of 21 multiple choice questions. Your health care provider scores your answers to assess: Your level of depression, ranging from mild to severe. Your response to treatment. Your health care provider may talk with you about your daily activities, such as eating, sleeping, work, and recreation, and ask if you have had any changes in activity. Your health care provider may ask you to see a mental health specialist, such as a psychiatrist or psychologist, for more evaluation. Who should be screened for  depression?  All adults, including adults with a family history of a mental health disorder. People who are 76-88 years old. People who are recovering from an acute condition, such as myocardial infarction (MI) or stroke. Pregnant women, or women who have given birth. People who have a long-term (chronic) illness. Anyone who has been diagnosed with another type of mental health disorder. Anyone who has symptoms that could show depression. What do my results mean? Your health care provider will review the results of your depression screening, physical exam, and lab tests. Positive screens suggest that you may have depression. Screening is the first step in getting the care that you may need. It will be important for you to know the results of your tests. Ask your health care provider, or the department that is doing your screening tests, when your results will be ready. Talk with your health care provider about your results, diagnosis, and recommendations for follow-up. A diagnosis of depression is made using information from the Diagnostic and Statistical Manual of Mental Disorders (DSM-5). This is a book that lists the number and type of symptoms that must be present for a health care provider to give a specific diagnosis. Your health care provider may work with you to treat your symptoms of depression, or your health care provider may help you find a mental health provider who can assess and help you develop a plan to treat your depression. Get help right away if: You have thoughts about hurting yourself or others. If you ever feel like you may hurt yourself or others, or have thoughts  about taking your own life, get help right away. Go to your nearest emergency department or: Call your local emergency services (911 in the [place] Call a suicide crisis helpline, such as the [facility] at 973-781-0377 or 988 in the [place]. This is open 24 hours a day in the [place] Text the  Crisis Text Line at 913-194-1830 (in the [place].). Summary Depression screening is the first step in getting the help that you may need. If your screening test shows symptoms of depression (is positive), your health care provider may ask you to see a mental health provider who will help identify ways to treat your depression. Anyone aged 69 or older should be screened for depression. This information is not intended to replace advice given to you by your health care provider. Make sure you discuss any questions you have with your health care provider. Document Revised: 05/03/2021 Document Reviewed: 01/16/2021 Elsevier Patient Education  [place].

## 2022-11-23 LAB — CBC
Hematocrit: 44 % (ref 34.0–46.6)
Hemoglobin: 14.2 g/dL (ref 11.1–15.9)
MCH: 27.7 pg (ref 26.6–33.0)
MCHC: 32.3 g/dL (ref 31.5–35.7)
MCV: 86 fL (ref 79–97)
Platelets: 183 10*3/uL (ref 150–450)
RBC: 5.12 x10E6/uL (ref 3.77–5.28)
RDW: 13.6 % (ref 11.7–15.4)
WBC: 4.2 10*3/uL (ref 3.4–10.8)

## 2022-11-23 LAB — BMP8+EGFR
BUN/Creatinine Ratio: 30 — ABNORMAL HIGH (ref 12–28)
BUN: 24 mg/dL (ref 8–27)
CO2: 25 mmol/L (ref 20–29)
Calcium: 9.5 mg/dL (ref 8.7–10.3)
Chloride: 103 mmol/L (ref 96–106)
Creatinine, Ser: 0.8 mg/dL (ref 0.57–1.00)
Glucose: 87 mg/dL (ref 70–99)
Potassium: 4.1 mmol/L (ref 3.5–5.2)
Sodium: 144 mmol/L (ref 134–144)
eGFR: 78 mL/min/{1.73_m2} (ref >59)

## 2022-11-23 LAB — VITAMIN B12: Vitamin B-12: 544 pg/mL (ref 232–1245)

## 2022-11-23 LAB — TSH: TSH: 1.2 u[IU]/mL (ref 0.450–4.500)

## 2022-11-30 DIAGNOSIS — M67962 Unspecified disorder of synovium and tendon, left lower leg: Secondary | ICD-10-CM | POA: Diagnosis not present

## 2022-11-30 DIAGNOSIS — M25572 Pain in left ankle and joints of left foot: Secondary | ICD-10-CM | POA: Diagnosis not present

## 2022-12-03 DIAGNOSIS — M25572 Pain in left ankle and joints of left foot: Secondary | ICD-10-CM | POA: Diagnosis not present

## 2022-12-03 DIAGNOSIS — M67962 Unspecified disorder of synovium and tendon, left lower leg: Secondary | ICD-10-CM | POA: Diagnosis not present

## 2022-12-11 DIAGNOSIS — M25572 Pain in left ankle and joints of left foot: Secondary | ICD-10-CM | POA: Diagnosis not present

## 2022-12-11 DIAGNOSIS — M67962 Unspecified disorder of synovium and tendon, left lower leg: Secondary | ICD-10-CM | POA: Diagnosis not present

## 2022-12-13 DIAGNOSIS — M67962 Unspecified disorder of synovium and tendon, left lower leg: Secondary | ICD-10-CM | POA: Diagnosis not present

## 2022-12-13 DIAGNOSIS — M25572 Pain in left ankle and joints of left foot: Secondary | ICD-10-CM | POA: Diagnosis not present

## 2022-12-18 DIAGNOSIS — M25572 Pain in left ankle and joints of left foot: Secondary | ICD-10-CM | POA: Diagnosis not present

## 2022-12-18 DIAGNOSIS — M67962 Unspecified disorder of synovium and tendon, left lower leg: Secondary | ICD-10-CM | POA: Diagnosis not present

## 2022-12-19 ENCOUNTER — Other Ambulatory Visit: Payer: Medicare Other

## 2022-12-20 DIAGNOSIS — M25572 Pain in left ankle and joints of left foot: Secondary | ICD-10-CM | POA: Diagnosis not present

## 2022-12-20 DIAGNOSIS — M67962 Unspecified disorder of synovium and tendon, left lower leg: Secondary | ICD-10-CM | POA: Diagnosis not present

## 2022-12-27 DIAGNOSIS — M67962 Unspecified disorder of synovium and tendon, left lower leg: Secondary | ICD-10-CM | POA: Diagnosis not present

## 2022-12-27 DIAGNOSIS — M25572 Pain in left ankle and joints of left foot: Secondary | ICD-10-CM | POA: Diagnosis not present

## 2023-01-07 ENCOUNTER — Ambulatory Visit
Admission: RE | Admit: 2023-01-07 | Discharge: 2023-01-07 | Disposition: A | Discharge status: Home / Self Care | Payer: Medicare Other | Source: Ambulatory Visit | Attending: Internal Medicine | Admitting: Internal Medicine

## 2023-01-07 VITALS — BP 120/74 | HR 60 | Temp 98.0°F | Resp 18

## 2023-01-07 DIAGNOSIS — L819 Disorder of pigmentation, unspecified: Secondary | ICD-10-CM | POA: Diagnosis not present

## 2023-01-07 DIAGNOSIS — L853 Xerosis cutis: Secondary | ICD-10-CM | POA: Diagnosis not present

## 2023-01-07 DIAGNOSIS — L821 Other seborrheic keratosis: Secondary | ICD-10-CM | POA: Diagnosis not present

## 2023-01-07 DIAGNOSIS — M545 Low back pain, unspecified: Secondary | ICD-10-CM | POA: Diagnosis not present

## 2023-01-07 DIAGNOSIS — T148XXA Other injury of unspecified body region, initial encounter: Secondary | ICD-10-CM | POA: Diagnosis not present

## 2023-01-07 LAB — POCT URINALYSIS DIP (MANUAL ENTRY)
Bilirubin, UA: NEGATIVE
Blood, UA: NEGATIVE
Glucose, UA: NEGATIVE mg/dL
Ketones, POC UA: NEGATIVE mg/dL
Leukocytes, UA: NEGATIVE
Nitrite, UA: NEGATIVE
Protein Ur, POC: NEGATIVE mg/dL
Spec Grav, UA: 1.025 (ref 1.010–1.025)
Urobilinogen, UA: 0.2 E.U./dL
pH, UA: 7.5 (ref 5.0–8.0)

## 2023-01-07 MED ORDER — METHOCARBAMOL 500 MG PO TABS: 500.0000 mg | ORAL_TABLET | Freq: Two times a day (BID) | ORAL | 0 refills | Status: DC | PRN

## 2023-01-07 NOTE — ED Triage Notes (Signed)
Pt c/o severe lower right back pain and spasms since ~ Friday says was unable to sleep last night. Not relieved with tylenol. Allergic to alieve and motrin.

## 2023-01-07 NOTE — Discharge Instructions (Signed)
Urine was clear.  Suspect muscle strain as we discussed.  I have prescribed a muscle relaxer to take as needed.  Do not take this with Ambien.  Follow-up if symptoms persist or worsen.

## 2023-01-07 NOTE — ED Provider Notes (Signed)
[facility]    CSN: [id number] Arrival date & time: 01/07/23  1550      History   Chief Complaint Chief Complaint  Patient presents with   Back Pain    HPI [name] is a 72 y.o. female.   Patient presents with right lower back pain that started about 4 days ago.  She denies any injury to the area.  Reports history of "disc surgery" in the lumbar spine in [id number] but no complications since.  Has taken Tylenol and Advil for symptoms with minimal improvement.  Reports movement exacerbates pain.  Denies urinary burning, urinary frequency, hematuria, urinary or bowel continence, saddle anesthesia.  Pain does not radiate.   Back Pain   Past Medical History:  Diagnosis Date   Aortic atherosclerosis ([place]) 04/16/2021   Arthritis    Cataract    Diverticulosis    H/O blood transfusion reaction 2010   hives   H/O migraine    last 2yrs ago   Headache(784.0)    Heart murmur    takes Bystolic nightly   History of blood clots 70yrs   was on Lovenox injections and Coumadin(was only on that for short period of time)   History of shingles 3-78yrs ago   Hx of seasonal allergies    takes Levocetirizine prn   Hyperlipidemia    takes Crestor nightly   Hypertension    takes Maxzide daily   Insomnia    takes Ambien prn   Joint pain    Joint swelling    Moderate tricuspid regurgitation    Pre-diabetes    Trigeminal neuralgia of right side of face 03/26/2016   V2 distribution    Patient Active Problem List   Diagnosis Date Noted   Adjustment disorder with depressed mood 11/22/2022   Coronary artery disease involving native coronary artery of native heart without angina pectoris 08/14/2022   Tension headache 08/14/2022   Arthralgia of left ankle 08/14/2022   Vitamin D deficiency 08/14/2022   Hypertensive heart disease without heart failure 08/14/2022   Contusion of right knee 10/08/2021   Aortic atherosclerosis ([place]) 04/16/2021   Chest pain 04/15/2021   Heart  failure due to valvular disease, unspecified heart failure type ([place]) 02/22/2021   Obese 12/14/2020   S/P total knee arthroplasty, 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   Moderate tricuspid regurgitation 10/20/2019   Bunion of great toe of right foot 01/12/2019   Cellulitis 12/23/2018   Foot infection    Great toe pain, right 12/17/2018   Prediabetes 12/17/2018   Cellulitis of foot 08/23/2017   Trigeminal neuralgia of right side of face 03/26/2016   Hyperlipidemia 12/04/2013   Benign essential HTN 12/04/2013   Constipation 12/04/2013   Insomnia 12/04/2013   Acute blood loss anemia 12/04/2013   Left hip pain 12/04/2013   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   Pain due to total hip replacement ([place]) 04/16/2012    Past Surgical History:  Procedure Laterality Date   ABDOMINAL HYSTERECTOMY     BREAST BIOPSY  05/03/2006   US   BREAST REDUCTION SURGERY  1987   BUNIONECTOMY Right 05/11/2019   CATARACT EXTRACTION Left 07/2019   CHOLECYSTECTOMY     COLONOSCOPY     ESOPHAGOGASTRODUODENOSCOPY     EYE SURGERY Right    eye lash removed   JOINT REPLACEMENT     bilateral hip rt in 2007 and lt in 2010   [place]  11/04/2008   Normal   REDUCTION MAMMAPLASTY Bilateral 1988  REDUCTION MAMMAPLASTY     SHOULDER ARTHROSCOPY WITH DISTAL CLAVICLE RESECTION Right 09/21/2015   Procedure: RIGHT SHOULDER ARTHROSCOPY WITH DISTAL CLAVICLE EXCISION DEBRIDE LABRAL TEAR ACROMIOPLASTY ;  Surgeon: Frederik Pear, MD;  Location: [facility];  Service: Orthopedics;  Laterality: Right;   TEE WITHOUT CARDIOVERSION N/A 04/12/2015   Procedure: TRANSESOPHAGEAL ECHOCARDIOGRAM (TEE)/BUBBLE STUDY;  Surgeon: Adrian Prows, MD;  Location: [place];  Service: Cardiovascular;  Laterality: N/A;   TOTAL HIP REVISION  04/14/2012   Procedure: TOTAL HIP REVISION;  Surgeon: Kerin Salen, MD;  Location: [place];  Service: Orthopedics;  Laterality: Right;  right total hip revision   TOTAL HIP REVISION Left 11/30/2013    Procedure: TOTAL HIP REVISION;  Surgeon: Kerin Salen, MD;  Location: [place];  Service: Orthopedics;  Laterality: Left;   TOTAL KNEE ARTHROPLASTY Right 12/13/2020   Procedure: TOTAL KNEE ARTHROPLASTY;  Surgeon: Paralee Cancel, MD;  Location: [facility];  Service: Orthopedics;  Laterality: Right;  70 mins    OB History   No obstetric history on file.      Home Medications    Prior to Admission medications   Medication Sig Start Date End Date Taking? Authorizing Provider  methocarbamol (ROBAXIN) 500 MG tablet Take 1 tablet (500 mg total) by mouth 2 (two) times daily as needed for muscle spasms. 01/07/23  Yes Sims Laday, Hildred Alamin E, FNP  acetaminophen (TYLENOL) 325 MG tablet Take 650 mg by mouth every 6 (six) hours as needed for headache.    [provider]  Cholecalciferol (DIALYVITE VITAMIN D 5000) 125 MCG (5000 UT) capsule Take 5,000 Units by mouth daily.    [provider]  methylPREDNISolone (MEDROL DOSEPAK) 4 MG TBPK tablet Take as directed 06/01/22   Lorenda Peck, DPM  nebivolol (BYSTOLIC) 5 MG tablet TAKE 1 TABLET DAILY 01/12/22   Glendale Chard, MD  ofloxacin (OCUFLOX) 0.3 % ophthalmic solution INSTILL 1 DROP IN RIGHT EYE FOUR TIMES DAILY. START 1 DAY BEFORE SURGERY Patient not taking: Reported on 11/22/2022    [provider]  ondansetron (ZOFRAN-ODT) 4 MG disintegrating tablet Take 1 tablet (4 mg total) by mouth every 8 (eight) hours as needed for nausea or vomiting. 02/08/22   Glendale Chard, MD  prednisoLONE acetate (PRED FORTE) 1 % ophthalmic suspension SHAKE LIQUID AND INSTILL 1 DROP IN RIGHT EYE FOUR TIMES DAILY AFTER SURGERY Patient not taking: Reported on 11/22/2022    [provider]  pregabalin (LYRICA) 75 MG capsule Take 1 capsule (75 mg total) by mouth daily. Patient not taking: Reported on 08/14/2022 12/27/21 01/26/22  Lorenda Peck, DPM  rosuvastatin (CRESTOR) 10 MG tablet TAKE 1 TABLET DAILY 05/23/22   Glendale Chard, MD  SYSTANE 0.4-0.3 % GEL ophthalmic  gel SMARTSIG:1 Drop(s) In Eye(s) PRN Patient not taking: Reported on 11/22/2022 12/13/21   [provider]  traMADol (ULTRAM) 50 MG tablet Take 1 tablet (50 mg total) by mouth 2 (two) times daily as needed. Patient not taking: Reported on 11/22/2022 08/14/22   Glendale Chard, MD  triamcinolone ointment (KENALOG) 0.1 % APPLY TOPICALLY TO THE AFFECTED AREA TWICE DAILY Patient not taking: Reported on 08/14/2022    [provider]  triamterene-hydrochlorothiazide (MAXZIDE-25) 37.5-25 MG tablet TAKE 1 TABLET BY MOUTH DAILY 10/29/22   Glendale Chard, MD  zolpidem (AMBIEN) 5 MG tablet Take 1 tablet (5 mg total) by mouth at bedtime as needed. for sleep 09/04/22   Glendale Chard, MD    Family History Family History  Problem Relation Age of Onset  Heart attack Mother    Alzheimer's disease Mother    Diabetes Sister    Hypertension Sister     Social History Social History   Tobacco Use   Smoking status: Former    Packs/day: 0.25    Years: 42.00    Additional pack years: 0.00    Total pack years: 10.50    Types: Cigarettes    Quit date: 10/22/2018    Years since quitting: 4.2   Smokeless tobacco: Never  Vaping Use   Vaping Use: Never used  Substance Use Topics   Alcohol use: Not Currently    Comment: lst dirnk 2 years ago    Drug use: No     Allergies   Aspirin, Quinapril hcl, Ibuprofen, Penicillins, and Pravastatin   Review of Systems Review of Systems Per HPI  Physical Exam Triage Vital Signs ED Triage Vitals  Enc Vitals Group     BP 01/07/23 1600 120/74     Pulse Rate 01/07/23 1600 60     Resp 01/07/23 1600 18     Temp 01/07/23 1600 98 F (36.7 C)     Temp Source 01/07/23 1600 Oral     SpO2 01/07/23 1600 95 %     Weight --      Height --      Head Circumference --      Peak Flow --      Pain Score 01/07/23 1601 7     Pain Loc --      Pain Edu? --      Excl. in [place]? --    No data found.  Updated Vital Signs BP 120/74 (BP Location: Left Arm)    Pulse 60   Temp 98 F (36.7 C) (Oral)   Resp 18   SpO2 95%   Visual Acuity Right Eye Distance:   Left Eye Distance:   Bilateral Distance:    Right Eye Near:   Left Eye Near:    Bilateral Near:     Physical Exam Constitutional:      General: She is not in acute distress.    Appearance: Normal appearance. She is not toxic-appearing or diaphoretic.  HENT:     Head: Normocephalic and atraumatic.  Eyes:     Extraocular Movements: Extraocular movements intact.     Conjunctiva/sclera: Conjunctivae normal.  Pulmonary:     Effort: Pulmonary effort is normal.  Musculoskeletal:     Comments: Patient has tenderness to palpation to right lower lumbar region.  There is no direct spinal tenderness, crepitus, step-off noted.  Patient does have old surgical scar overlying lumbar spine.  No swelling or discoloration present.   Neurological:     General: No focal deficit present.     Mental Status: She is alert and oriented to person, place, and time. Mental status is at baseline.  Psychiatric:        Mood and Affect: Mood normal.        Behavior: Behavior normal.        Thought Content: Thought content normal.        Judgment: Judgment normal.      UC Treatments / Results  Labs (all labs ordered are listed, but only abnormal results are displayed) Labs Reviewed  POCT URINALYSIS DIP (MANUAL ENTRY)    EKG   Radiology No results found.  Procedures Procedures (including critical care time)  Medications Ordered in UC Medications - No data to display  Initial Impression / Assessment and Plan / UC Course  I have reviewed the triage vital signs and the nursing notes.  Pertinent labs & imaging results that were available during my care of the patient were reviewed by me and considered in my medical decision making (see chart for details).     UA completed which is unremarkable.  Suspect muscular strain/injury.  Given no direct injury or spinal tenderness, will defer imaging.   Will prescribe muscle relaxer for pain as patient has taken this before and tolerated well.  She denies that she takes any daily muscle relaxers so that should be safe.  Advised to not take with Ambien as she takes Ambien as needed.  Reminded patient that muscle relaxer can make her drowsy and do not drive or drink alcohol with taking it.  Patient voiced understanding of these instructions.  Advised supportive care and following up with her established orthopedist if symptoms persist or worsen.  Patient verbalized understanding and was agreeable with plan. Final Clinical Impressions(s) / UC Diagnoses   Final diagnoses:  Muscle strain  Acute right-sided low back pain without sciatica     Discharge Instructions      Urine was clear.  Suspect muscle strain as we discussed.  I have prescribed a muscle relaxer to take as needed.  Do not take this with Ambien.  Follow-up if symptoms persist or worsen.     ED Prescriptions     Medication Sig Dispense Auth. Provider   methocarbamol (ROBAXIN) 500 MG tablet Take 1 tablet (500 mg total) by mouth 2 (two) times daily as needed for muscle spasms. 20 tablet [place]      I have reviewed the PDMP during this encounter.   Teodora Medici, [place] 01/07/23 1626

## 2023-01-20 ENCOUNTER — Other Ambulatory Visit: Payer: Self-pay | Admitting: Internal Medicine

## 2023-01-22 ENCOUNTER — Other Ambulatory Visit: Payer: Self-pay | Admitting: Internal Medicine

## 2023-01-22 DIAGNOSIS — Z1231 Encounter for screening mammogram for malignant neoplasm of breast: Secondary | ICD-10-CM

## 2023-02-06 ENCOUNTER — Ambulatory Visit: Payer: Medicare Other

## 2023-02-06 DIAGNOSIS — I10 Essential (primary) hypertension: Secondary | ICD-10-CM | POA: Diagnosis not present

## 2023-02-06 DIAGNOSIS — I071 Rheumatic tricuspid insufficiency: Secondary | ICD-10-CM | POA: Diagnosis not present

## 2023-02-14 ENCOUNTER — Encounter: Payer: Self-pay | Admitting: Internal Medicine

## 2023-02-20 ENCOUNTER — Ambulatory Visit (INDEPENDENT_AMBULATORY_CARE_PROVIDER_SITE_OTHER): Payer: Medicare Other | Admitting: Internal Medicine

## 2023-02-20 ENCOUNTER — Ambulatory Visit (INDEPENDENT_AMBULATORY_CARE_PROVIDER_SITE_OTHER): Payer: Medicare Other

## 2023-02-20 ENCOUNTER — Encounter: Payer: Self-pay | Admitting: Internal Medicine

## 2023-02-20 VITALS — BP 106/60 | HR 55 | Temp 98.3°F | Ht 60.0 in | Wt 164.0 lb

## 2023-02-20 VITALS — BP 106/60 | HR 55 | Temp 98.3°F | Ht 60.2 in | Wt 164.6 lb

## 2023-02-20 DIAGNOSIS — I5032 Chronic diastolic (congestive) heart failure: Secondary | ICD-10-CM | POA: Diagnosis not present

## 2023-02-20 DIAGNOSIS — R7303 Prediabetes: Secondary | ICD-10-CM

## 2023-02-20 DIAGNOSIS — M2550 Pain in unspecified joint: Secondary | ICD-10-CM

## 2023-02-20 DIAGNOSIS — Z0000 Encounter for general adult medical examination without abnormal findings: Secondary | ICD-10-CM

## 2023-02-20 DIAGNOSIS — I700 Atherosclerosis of aorta: Secondary | ICD-10-CM | POA: Diagnosis not present

## 2023-02-20 DIAGNOSIS — Z6832 Body mass index (BMI) 32.0-32.9, adult: Secondary | ICD-10-CM | POA: Diagnosis not present

## 2023-02-20 DIAGNOSIS — N3941 Urge incontinence: Secondary | ICD-10-CM | POA: Diagnosis not present

## 2023-02-20 DIAGNOSIS — I071 Rheumatic tricuspid insufficiency: Secondary | ICD-10-CM | POA: Diagnosis not present

## 2023-02-20 DIAGNOSIS — I2510 Atherosclerotic heart disease of native coronary artery without angina pectoris: Secondary | ICD-10-CM | POA: Diagnosis not present

## 2023-02-20 DIAGNOSIS — E6609 Other obesity due to excess calories: Secondary | ICD-10-CM

## 2023-02-20 DIAGNOSIS — I110 Hypertensive heart disease with heart failure: Secondary | ICD-10-CM

## 2023-02-20 DIAGNOSIS — M67979 Unspecified disorder of synovium and tendon, unspecified ankle and foot: Secondary | ICD-10-CM | POA: Diagnosis not present

## 2023-02-20 LAB — POCT URINALYSIS DIPSTICK
Bilirubin, UA: NEGATIVE
Blood, UA: NEGATIVE
Glucose, UA: NEGATIVE
Ketones, UA: NEGATIVE
Leukocytes, UA: NEGATIVE
Nitrite, UA: NEGATIVE
Protein, UA: NEGATIVE
Spec Grav, UA: 1.02 (ref 1.010–1.025)
Urobilinogen, UA: 0.2 E.U./dL
pH, UA: 7 (ref 5.0–8.0)

## 2023-02-20 MED ORDER — PREGABALIN 75 MG PO CAPS: 75.0000 mg | ORAL_CAPSULE | Freq: Every day | ORAL | 0 refills | Status: DC

## 2023-02-20 NOTE — Patient Instructions (Signed)
[name] , Thank you for taking time to come for your Medicare Wellness Visit. I appreciate your ongoing commitment to your health goals. Please review the following plan we discussed and let me know if I can assist you in the future.   These are the goals we discussed:  Goals       Patient Stated (pt-stated)      Plans to re join the gym      Patient Stated      01/25/2021, wants to get knee better (want to get back to normal)      Patient Stated      02/08/2022, start exercising      Patient Stated      02/20/2023, wants to lose weight      Weight (lb) < 200 lb (90.7 kg)      12/24/2019, wants to get down to 140 pounds and start exercising again.        This is a list of the screening recommended for you and due dates:  Health Maintenance  Topic Date Due   COVID-19 Vaccine (4 - 2023-24 season) 06/22/2022   Zoster (Shingles) Vaccine (2 of 2) 05/23/2023*   Flu Shot  05/23/2023   Medicare Annual Wellness Visit  02/20/2024   Mammogram  03/20/2024   Colon Cancer Screening  04/09/2027   DTaP/Tdap/Td vaccine (2 - Td or Tdap) 08/24/2027   Pneumonia Vaccine  Completed   DEXA scan (bone density measurement)  Completed   Hepatitis C Screening: USPSTF Recommendation to screen - Ages 64-79 yo.  Completed   HPV Vaccine  Aged Out  *Topic was postponed. The date shown is not the original due date.    Advanced directives: Please bring a copy of your POA (Power of Attorney) and/or Living Will to your next appointment.   Conditions/risks identified: none  Next appointment: Follow up in one year for your annual wellness visit    Preventive Care 65 Years and Older, Female Preventive care refers to lifestyle choices and visits with your health care provider that can promote health and wellness. What does preventive care include? A yearly physical exam. This is also called an annual well check. Dental exams once or twice a year. Routine eye exams. Ask your health care provider how often  you should have your eyes checked. Personal lifestyle choices, including: Daily care of your teeth and gums. Regular physical activity. Eating a healthy diet. Avoiding tobacco and drug use. Limiting alcohol use. Practicing safe sex. Taking low-dose aspirin every day. Taking vitamin and mineral supplements as recommended by your health care provider. What happens during an annual well check? The services and screenings done by your health care provider during your annual well check will depend on your age, overall health, lifestyle risk factors, and family history of disease. Counseling  Your health care provider may ask you questions about your: Alcohol use. Tobacco use. Drug use. Emotional well-being. Home and relationship well-being. Sexual activity. Eating habits. History of falls. Memory and ability to understand (cognition). Work and work Astronomer. Reproductive health. Screening  You may have the following tests or measurements: Height, weight, and BMI. Blood pressure. Lipid and cholesterol levels. These may be checked every 5 years, or more frequently if you are over 29 years old. Skin check. Lung cancer screening. You may have this screening every year starting at age 4 if you have a 30-pack-year history of smoking and currently smoke or have quit within the past 15 years. Fecal occult blood  test (FOBT) of the stool. You may have this test every year starting at age 59. Flexible sigmoidoscopy or colonoscopy. You may have a sigmoidoscopy every 5 years or a colonoscopy every 10 years starting at age 59. Hepatitis C blood test. Hepatitis B blood test. Sexually transmitted disease (STD) testing. Diabetes screening. This is done by checking your blood sugar (glucose) after you have not eaten for a while (fasting). You may have this done every 1-3 years. Bone density scan. This is done to screen for osteoporosis. You may have this done starting at age 67. Mammogram. This  may be done every 1-2 years. Talk to your health care provider about how often you should have regular mammograms. Talk with your health care provider about your test results, treatment options, and if necessary, the need for more tests. Vaccines  Your health care provider may recommend certain vaccines, such as: Influenza vaccine. This is recommended every year. Tetanus, diphtheria, and acellular pertussis (Tdap, Td) vaccine. You may need a Td booster every 10 years. Zoster vaccine. You may need this after age 80. Pneumococcal 13-valent conjugate (PCV13) vaccine. One dose is recommended after age 71. Pneumococcal polysaccharide (PPSV23) vaccine. One dose is recommended after age 70. Talk to your health care provider about which screenings and vaccines you need and how often you need them. This information is not intended to replace advice given to you by your health care provider. Make sure you discuss any questions you have with your health care provider. Document Released: 11/04/2015 Document Revised: 06/27/2016 Document Reviewed: 08/09/2015 Elsevier Interactive Patient Education  2017 [employer].  Fall Prevention in the Home Falls can cause injuries. They can happen to people of all ages. There are many things you can do to make your home safe and to help prevent falls. What can I do on the outside of my home? Regularly fix the edges of walkways and driveways and fix any cracks. Remove anything that might make you trip as you walk through a door, such as a raised step or threshold. Trim any bushes or trees on the path to your home. Use bright outdoor lighting. Clear any walking paths of anything that might make someone trip, such as rocks or tools. Regularly check to see if handrails are loose or broken. Make sure that both sides of any steps have handrails. Any raised decks and porches should have guardrails on the edges. Have any leaves, snow, or ice cleared regularly. Use sand or  salt on walking paths during winter. Clean up any spills in your garage right away. This includes oil or grease spills. What can I do in the bathroom? Use night lights. Install grab bars by the toilet and in the tub and shower. Do not use towel bars as grab bars. Use non-skid mats or decals in the tub or shower. If you need to sit down in the shower, use a plastic, non-slip stool. Keep the floor dry. Clean up any water that spills on the floor as soon as it happens. Remove soap buildup in the tub or shower regularly. Attach bath mats securely with double-sided non-slip rug tape. Do not have throw rugs and other things on the floor that can make you trip. What can I do in the bedroom? Use night lights. Make sure that you have a light by your bed that is easy to reach. Do not use any sheets or blankets that are too big for your bed. They should not hang down onto the floor. Have  a firm chair that has side arms. You can use this for support while you get dressed. Do not have throw rugs and other things on the floor that can make you trip. What can I do in the kitchen? Clean up any spills right away. Avoid walking on wet floors. Keep items that you use a lot in easy-to-reach places. If you need to reach something above you, use a strong step stool that has a grab bar. Keep electrical cords out of the way. Do not use floor polish or wax that makes floors slippery. If you must use wax, use non-skid floor wax. Do not have throw rugs and other things on the floor that can make you trip. What can I do with my stairs? Do not leave any items on the stairs. Make sure that there are handrails on both sides of the stairs and use them. Fix handrails that are broken or loose. Make sure that handrails are as long as the stairways. Check any carpeting to make sure that it is firmly attached to the stairs. Fix any carpet that is loose or worn. Avoid having throw rugs at the top or bottom of the stairs. If  you do have throw rugs, attach them to the floor with carpet tape. Make sure that you have a light switch at the top of the stairs and the bottom of the stairs. If you do not have them, ask someone to add them for you. What else can I do to help prevent falls? Wear shoes that: Do not have high heels. Have rubber bottoms. Are comfortable and fit you well. Are closed at the toe. Do not wear sandals. If you use a stepladder: Make sure that it is fully opened. Do not climb a closed stepladder. Make sure that both sides of the stepladder are locked into place. Ask someone to hold it for you, if possible. Clearly mark and make sure that you can see: Any grab bars or handrails. First and last steps. Where the edge of each step is. Use tools that help you move around (mobility aids) if they are needed. These include: Canes. Walkers. Scooters. Crutches. Turn on the lights when you go into a dark area. Replace any light bulbs as soon as they burn out. Set up your furniture so you have a clear path. Avoid moving your furniture around. If any of your floors are uneven, fix them. If there are any pets around you, be aware of where they are. Review your medicines with your doctor. Some medicines can make you feel dizzy. This can increase your chance of falling. Ask your doctor what other things that you can do to help prevent falls. This information is not intended to replace advice given to you by your health care provider. Make sure you discuss any questions you have with your health care provider. Document Released: 08/04/2009 Document Revised: 03/15/2016 Document Reviewed: 11/12/2014 Elsevier Interactive Patient Education  2017 [employer].

## 2023-02-20 NOTE — Progress Notes (Signed)
I,[name],acting as a scribe for Veronica Aliment, MD.,have documented all relevant documentation on the behalf of Veronica Aliment, MD,as directed by  Veronica Aliment, MD while in the presence of Veronica Aliment, MD.    Subjective:     Patient ID: [name] , female    DOB: [birth year] , 72 y.o.   MRN: [id number]   Chief Complaint  Patient presents with   Hypertension   Prediabetes    HPI  Patient presents today for a bp check, she reports compliance with meds. She denies having any chest pain, shortness of breath and palpitations.   She reports experiencing chills frequently. Denies fever, congestion, cough. She states it feels like the chills begin from the inside & "come out". She is not sure what triggers her symptoms. Denies recent infections. Denies dysuria.   AWV completed with [facility] Advisor: Nickeah.     Hypertension This is a chronic problem. The current episode started more than 1 year ago. The problem has been gradually improving since onset. The problem is controlled. Pertinent negatives include no blurred vision. Risk factors for coronary artery disease include dyslipidemia, post-menopausal state, sedentary lifestyle and stress. Past treatments include beta blockers and diuretics. The current treatment provides moderate improvement.     Past Medical History:  Diagnosis Date   Aortic atherosclerosis (HCC) 04/16/2021   Arthritis    Cataract    Diverticulosis    H/O blood transfusion reaction 2010   hives   H/O migraine    last 41yrs ago   Headache(784.0)    Heart murmur    takes Bystolic nightly   History of blood clots 71yrs   was on Lovenox injections and Coumadin(was only on that for short period of time)   History of shingles 3-11yrs ago   Hx of seasonal allergies    takes Levocetirizine prn   Hyperlipidemia    takes Crestor nightly   Hypertension    takes Maxzide daily   Insomnia    takes Ambien prn   Joint pain    Joint swelling     Moderate tricuspid regurgitation    Pre-diabetes    Trigeminal neuralgia of right side of face 03/26/2016   V2 distribution     Family History  Problem Relation Age of Onset   Heart attack Mother    Alzheimer's disease Mother    Diabetes Sister    Hypertension Sister      Current Outpatient Medications:    acetaminophen (TYLENOL) 325 MG tablet, Take 650 mg by mouth every 6 (six) hours as needed for headache., Disp: , Rfl:    Cholecalciferol (DIALYVITE VITAMIN D 5000) 125 MCG (5000 UT) capsule, Take 5,000 Units by mouth daily., Disp: , Rfl:    methocarbamol (ROBAXIN) 500 MG tablet, Take 1 tablet (500 mg total) by mouth 2 (two) times daily as needed for muscle spasms., Disp: 20 tablet, Rfl: 0   nebivolol (BYSTOLIC) 5 MG tablet, TAKE 1 TABLET DAILY, Disp: 90 tablet, Rfl: 3   ondansetron (ZOFRAN-ODT) 4 MG disintegrating tablet, Take 1 tablet (4 mg total) by mouth every 8 (eight) hours as needed for nausea or vomiting., Disp: 20 tablet, Rfl: 0   rosuvastatin (CRESTOR) 10 MG tablet, TAKE 1 TABLET DAILY, Disp: 90 tablet, Rfl: 3   triamterene-hydrochlorothiazide (MAXZIDE-25) 37.5-25 MG tablet, TAKE 1 TABLET BY MOUTH DAILY, Disp: 90 tablet, Rfl: 1   zolpidem (AMBIEN) 5 MG tablet, Take 1 tablet (5 mg total) by mouth at bedtime as  needed. for sleep, Disp: 60 tablet, Rfl: 2   methylPREDNISolone (MEDROL DOSEPAK) 4 MG TBPK tablet, Take as directed (Patient not taking: Reported on 02/20/2023), Disp: 21 tablet, Rfl: 0   ofloxacin (OCUFLOX) 0.3 % ophthalmic solution, INSTILL 1 DROP IN RIGHT EYE FOUR TIMES DAILY. START 1 DAY BEFORE SURGERY (Patient not taking: Reported on 11/22/2022), Disp: , Rfl:    prednisoLONE acetate (PRED FORTE) 1 % ophthalmic suspension, SHAKE LIQUID AND INSTILL 1 DROP IN RIGHT EYE FOUR TIMES DAILY AFTER SURGERY (Patient not taking: Reported on 11/22/2022), Disp: , Rfl:    pregabalin (LYRICA) 75 MG capsule, Take 1 capsule (75 mg total) by mouth daily., Disp: 90 capsule, Rfl: 0   SYSTANE  0.4-0.3 % GEL ophthalmic gel, SMARTSIG:1 Drop(s) In Eye(s) PRN (Patient not taking: Reported on 11/22/2022), Disp: , Rfl:    traMADol (ULTRAM) 50 MG tablet, Take 1 tablet (50 mg total) by mouth 2 (two) times daily as needed. (Patient not taking: Reported on 11/22/2022), Disp: 30 tablet, Rfl: 0   triamcinolone ointment (KENALOG) 0.1 %, APPLY TOPICALLY TO THE AFFECTED AREA TWICE DAILY (Patient not taking: Reported on 08/14/2022), Disp: , Rfl:    Allergies  Allergen Reactions   Aspirin Anaphylaxis   Quinapril Hcl Anaphylaxis   Ibuprofen Hives and Swelling   Penicillins Hives and Swelling    Has patient had a PCN reaction causing immediate rash, facial/tongue/throat swelling, SOB or lightheadedness with hypotension: Yes Has patient had a PCN reaction causing severe rash involving mucus membranes or skin necrosis: No Has patient had a PCN reaction that required hospitalization: No Has patient had a PCN reaction occurring within the last 10 years: No If all of the above answers are "NO", then may proceed with Cephalosporin use. Tolerated Cephalosporin Date: 12/13/20.     Pravastatin Rash     Review of Systems  Constitutional: Negative.   Eyes:  Negative for blurred vision.  Respiratory: Negative.    Cardiovascular: Negative.   Gastrointestinal: Negative.   Musculoskeletal:  Positive for arthralgias.  Skin: Negative.   Neurological: Negative.   Psychiatric/Behavioral: Negative.       Today's Vitals   02/20/23 1006  BP: 106/60  Pulse: (!) 55  Temp: 98.3 F (36.8 C)  SpO2: 98%  Weight: 164 lb (74.4 kg)  Height: 5' (1.524 m)   Body mass index is 32.03 kg/m.  Wt Readings from Last 3 Encounters:  02/20/23 164 lb (74.4 kg)  02/20/23 164 lb 9.6 oz (74.7 kg)  11/22/22 164 lb 6.4 oz (74.6 kg)    Objective:  Physical Exam Vitals and nursing note reviewed.  Constitutional:      Appearance: Normal appearance.  HENT:     Head: Normocephalic and atraumatic.  Eyes:     Extraocular  Movements: Extraocular movements intact.  Cardiovascular:     Rate and Rhythm: Normal rate and regular rhythm.     Heart sounds: Murmur heard.  Pulmonary:     Effort: Pulmonary effort is normal.     Breath sounds: Normal breath sounds.  Musculoskeletal:     Cervical back: Normal range of motion.  Skin:    General: Skin is warm.  Neurological:     General: No focal deficit present.     Mental Status: She is alert.  Psychiatric:        Mood and Affect: Mood normal.        Behavior: Behavior normal.      Assessment And Plan:     1. Hypertensive heart disease  with chronic diastolic congestive heart failure (HCC) Comments: Chronic, well controlled. She will c/w Maxzide and nebivolol daily. She is encouraged to follow a low sodium diet. - Amb Referral To Provider Referral Exercise Program (P.R.E.P)  2. 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 She agrees to [employer] referral. She is on statin therapy and Bblocker, allergic to ASA. - Lipid panel - Amb Referral To Provider Referral Exercise Program (P.R.E.P)  3. Aortic atherosclerosis (HCC) Comments: Chronic, LDL goal < 70. She is NOT on ASA due to ALLERGY. She will c/w rosuvastatin 10mg  daily. - Amb Referral To Provider Referral Exercise Program (P.R.E.P)  4. Moderate tricuspid regurgitation Comments: April 2024 echo results reviewed in full detail. She has not see Cardiology since 2022. I will refer her back to Dr. Jacinto Halim for further evaluation.  5. Urge incontinence Comments: I will check u/a to r/o UTI. She may benefit from pelvic PT and/or Myrbetriq once daily. - POCT Urinalysis Dipstick (81002)  6. Arthralgia, unspecified joint Comments: Chronic, I will check labs as below. She is encouraged to follow an anti-inflammatory diet, free of refined sugars, gluten etc. - ANA, IFA (with reflex) - CYCLIC CITRUL  PEPTIDE ANTIBODY, IGG/IGA - Rheumatoid factor - Sedimentation rate - Uric acid  7. Tibialis posterior tendinopathy Comments: Ortho/PT notes reviewed. I will send refill of tramadol. PDMP reviewed.  8. Prediabetes Comments: Previous labs reviewed, her a1c has been elevated in the past. I will recheck an a1c today. She is encouraged to decrease intake of sugary drinks/foods. - Hemoglobin A1c - Lipid panel - Amb Referral To Provider Referral Exercise Program (P.R.E.P)  9. Class 1 obesity due to excess calories with serious comorbidity and body mass index (BMI) of 32.0 to 32.9 in adult Comments: She is encouraged to strive for BMI less than 30 to decrease cardiac risk. Advised to aim for at least 150 minutes of exercise per week. - Amb Referral To Provider Referral Exercise Program (P.R.E.P)   Patient was given opportunity to ask questions. Patient verbalized understanding of the plan and was able to repeat key elements of the plan. All questions were answered to their satisfaction.   I, Veronica Aliment, MD, have reviewed all documentation for this visit. The documentation on 02/20/23 for the exam, diagnosis, procedures, and orders are all accurate and complete.   IF YOU HAVE BEEN REFERRED TO A SPECIALIST, IT MAY TAKE 1-2 WEEKS TO SCHEDULE/PROCESS THE REFERRAL. IF YOU HAVE NOT HEARD FROM US/SPECIALIST IN TWO WEEKS, PLEASE GIVE [place] A CALL AT [phone] X [phone].   THE PATIENT IS ENCOURAGED TO PRACTICE SOCIAL DISTANCING DUE TO THE COVID-19 PANDEMIC.

## 2023-02-20 NOTE — Patient Instructions (Signed)
Joint Pain  Joint pain can be caused by many things. It is likely to go away if you follow instructions from your doctor for taking care of yourself at home. Sometimes, you may need more treatment. Follow these instructions at home: Managing pain, stiffness, and swelling     If told, put ice on the painful area. To do this: If you have a removable elastic bandage, sling, or splint, take it off as told by your doctor. Put ice in a plastic bag. Place a towel between your skin and the bag. Leave the ice on for 20 minutes, 2-3 times a day. Take off the ice if your skin turns bright red. This is very important. If you cannot feel pain, heat, or cold, you have a greater risk of damage to the area. Move your fingers or toes below the painful joint often. Raise the painful joint above the level of your heart while you are sitting or lying down. If told, put heat on the painful area. Do this as often as told by your doctor. Use the heat source that your doctor recommends, such as a moist heat pack or a heating pad. Place a towel between your skin and the heat source. Leave the heat on for 20-30 minutes. Take off the heat if your skin gets bright red. This is especially important if you are unable to feel pain, heat, or cold. You may have a greater risk of getting burned. Activity Rest the painful joint for as long as told by your doctor. Do not do things that cause pain or make your pain worse. Begin exercising or stretching the affected area, as told by your doctor. Ask your doctor what types of exercise are safe for you. Return to your normal activities when your doctor says that it is safe. If you have an elastic bandage, sling, or splint: Wear it as told by your doctor. Take it only as told by your doctor. Loosen it your fingers or toes below the joint: Tingle. Become numb. Get cold and blue. Keep it clean. Ask your doctor if you should take it off before bathing. If it is not  waterproof: Do not let it get wet. Cover it with a watertight covering when you take a bath or shower. General instructions Take over-the-counter and prescription medicines only as told by your doctor. This may include medicines taken by mouth or applied to the skin. Do not smoke or use any products that contain nicotine or tobacco. If you need help quitting, ask your doctor. Keep all follow-up visits as told by your doctor. This is important. Contact a doctor if: You have pain that gets worse and does not get better with medicine. Your joint pain does not get better in 3 days. You have more bruising or swelling. You have a fever. You lose 10 lb (4.5 kg) or more without trying. Get help right away if: You cannot move the joint. Your fingers or toes tingle, become numb. or get cold and blue. You have a fever along with a joint that is red, warm, and swollen. Summary Joint pain can be caused by many things. It often goes away if you follow instructions from your doctor for taking care of yourself at home. Rest the painful joint for as long as told. Do not do things that cause pain or make your pain worse. Take over-the-counter and prescription medicines only as told by your doctor. This information is not intended to replace advice given to you   by your health care provider. Make sure you discuss any questions you have with your health care provider. Document Revised: 01/20/2020 Document Reviewed: 01/20/2020 [employer] Patient Education  2023 [employer]  

## 2023-02-20 NOTE — Progress Notes (Signed)
Subjective:   [name] is a 72 y.o. female who presents for Medicare Annual (Subsequent) preventive examination.  Review of Systems     Cardiac Risk Factors include: advanced age (>64men, >19 women);dyslipidemia;hypertension;obesity (BMI >30kg/m2)     Objective:    Today's Vitals   02/20/23 0950 02/20/23 0957  BP: 106/60   Pulse: (!) 55   Temp: 98.3 F (36.8 C)   TempSrc: Oral   SpO2: 98%   Weight: 164 lb 9.6 oz (74.7 kg)   Height: 5' 0.2" (1.529 m)   PainSc:  7    Body mass index is 31.93 kg/m.     02/20/2023   10:04 AM 02/08/2022    9:01 AM 07/03/2021    7:53 PM 04/16/2021    7:36 AM 04/15/2021    9:55 AM 01/25/2021    3:21 PM 12/13/2020   11:28 AM  Advanced Directives  Does Patient Have a Medical Advance Directive? Yes No No  No No No  Type of Media planner of Healthcare Power of Attorney in Chart? No - copy requested        Would patient like information on creating a medical advance directive?  No - Patient declined No - Patient declined No - Patient declined   No - Patient declined    Current Medications (verified) Outpatient Encounter Medications as of 02/20/2023  Medication Sig   acetaminophen (TYLENOL) 325 MG tablet Take 650 mg by mouth every 6 (six) hours as needed for headache.   Cholecalciferol (DIALYVITE VITAMIN D 5000) 125 MCG (5000 UT) capsule Take 5,000 Units by mouth daily.   methocarbamol (ROBAXIN) 500 MG tablet Take 1 tablet (500 mg total) by mouth 2 (two) times daily as needed for muscle spasms.   nebivolol (BYSTOLIC) 5 MG tablet TAKE 1 TABLET DAILY   ondansetron (ZOFRAN-ODT) 4 MG disintegrating tablet Take 1 tablet (4 mg total) by mouth every 8 (eight) hours as needed for nausea or vomiting.   rosuvastatin (CRESTOR) 10 MG tablet TAKE 1 TABLET DAILY   triamterene-hydrochlorothiazide (MAXZIDE-25) 37.5-25 MG tablet TAKE 1 TABLET BY MOUTH DAILY   zolpidem (AMBIEN) 5 MG tablet Take 1 tablet (5 mg total) by  mouth at bedtime as needed. for sleep   methylPREDNISolone (MEDROL DOSEPAK) 4 MG TBPK tablet Take as directed (Patient not taking: Reported on 02/20/2023)   ofloxacin (OCUFLOX) 0.3 % ophthalmic solution INSTILL 1 DROP IN RIGHT EYE FOUR TIMES DAILY. START 1 DAY BEFORE SURGERY (Patient not taking: Reported on 11/22/2022)   prednisoLONE acetate (PRED FORTE) 1 % ophthalmic suspension SHAKE LIQUID AND INSTILL 1 DROP IN RIGHT EYE FOUR TIMES DAILY AFTER SURGERY (Patient not taking: Reported on 11/22/2022)   pregabalin (LYRICA) 75 MG capsule Take 1 capsule (75 mg total) by mouth daily. (Patient not taking: Reported on 08/14/2022)   SYSTANE 0.4-0.3 % GEL ophthalmic gel SMARTSIG:1 Drop(s) In Eye(s) PRN (Patient not taking: Reported on 11/22/2022)   traMADol (ULTRAM) 50 MG tablet Take 1 tablet (50 mg total) by mouth 2 (two) times daily as needed. (Patient not taking: Reported on 11/22/2022)   triamcinolone ointment (KENALOG) 0.1 % APPLY TOPICALLY TO THE AFFECTED AREA TWICE DAILY (Patient not taking: Reported on 08/14/2022)   No facility-administered encounter medications on file as of 02/20/2023.    Allergies (verified) Aspirin, Quinapril hcl, Ibuprofen, Penicillins, and Pravastatin   History: Past Medical History:  Diagnosis Date   Aortic atherosclerosis (HCC) 04/16/2021   Arthritis  Cataract    Diverticulosis    H/O blood transfusion reaction 2010   hives   H/O migraine    last 77yrs ago   Headache(784.0)    Heart murmur    takes Bystolic nightly   History of blood clots 71yrs   was on Lovenox injections and Coumadin(was only on that for short period of time)   History of shingles 3-22yrs ago   Hx of seasonal allergies    takes Levocetirizine prn   Hyperlipidemia    takes Crestor nightly   Hypertension    takes Maxzide daily   Insomnia    takes Ambien prn   Joint pain    Joint swelling    Moderate tricuspid regurgitation    Pre-diabetes    Trigeminal neuralgia of right side of face 03/26/2016    V2 distribution   Past Surgical History:  Procedure Laterality Date   ABDOMINAL HYSTERECTOMY     BREAST BIOPSY  05/03/2006   US   BREAST REDUCTION SURGERY  1987   BUNIONECTOMY Right 05/11/2019   CATARACT EXTRACTION Left 07/2019   CHOLECYSTECTOMY     COLONOSCOPY     ESOPHAGOGASTRODUODENOSCOPY     EYE SURGERY Right    eye lash removed   JOINT REPLACEMENT     bilateral hip rt in 2007 and lt in 2010   NM MYOCAR PERF WALL MOTION  11/04/2008   Normal   REDUCTION MAMMAPLASTY Bilateral 1988   REDUCTION MAMMAPLASTY     SHOULDER ARTHROSCOPY WITH DISTAL CLAVICLE RESECTION Right 09/21/2015   Procedure: RIGHT SHOULDER ARTHROSCOPY WITH DISTAL CLAVICLE EXCISION DEBRIDE LABRAL TEAR ACROMIOPLASTY ;  Surgeon: Gean Birchwood, MD;  Location: [facility];  Service: Orthopedics;  Laterality: Right;   TEE WITHOUT CARDIOVERSION N/A 04/12/2015   Procedure: TRANSESOPHAGEAL ECHOCARDIOGRAM (TEE)/BUBBLE STUDY;  Surgeon: Yates Decamp, MD;  Location: [facility];  Service: Cardiovascular;  Laterality: N/A;   TOTAL HIP REVISION  04/14/2012   Procedure: TOTAL HIP REVISION;  Surgeon: [name], MD;  Location: [facility] OR;  Service: Orthopedics;  Laterality: Right;  right total hip revision   TOTAL HIP REVISION Left 11/30/2013   Procedure: TOTAL HIP REVISION;  Surgeon: [name], MD;  Location: [facility] OR;  Service: Orthopedics;  Laterality: Left;   TOTAL KNEE ARTHROPLASTY Right 12/13/2020   Procedure: TOTAL KNEE ARTHROPLASTY;  Surgeon: Durene Romans, MD;  Location: [facility];  Service: Orthopedics;  Laterality: Right;  70 mins   Family History  Problem Relation Age of Onset   Heart attack Mother    Alzheimer's disease Mother    Diabetes Sister    Hypertension Sister    Social History   Socioeconomic History   Marital status: Married    Spouse name: Not on file   Number of children: 2   Years of education: 14   Highest education level: Not on file  Occupational History   Occupation: Retired  Tobacco Use    Smoking status: Former    Packs/day: 0.25    Years: 42.00    Additional pack years: 0.00    Total pack years: 10.50    Types: Cigarettes    Quit date: 10/22/2018    Years since quitting: 4.3   Smokeless tobacco: Never  Vaping Use   Vaping Use: Never used  Substance and Sexual Activity   Alcohol use: Not Currently    Comment: lst dirnk 2 years ago    Drug use: No   Sexual activity: Not Currently    Birth control/protection:  Surgical  Other Topics Concern   Not on file  Social History Narrative   Lives at home w/ her husband   Right-handed   About 1 cup of coffee every other day   Social Determinants of Health   Financial Resource Strain: Low Risk  (02/20/2023)   Overall Financial Resource Strain (CARDIA)    Difficulty of Paying Living Expenses: Not hard at all  Food Insecurity: No Food Insecurity (02/20/2023)   Hunger Vital Sign    Worried About Running Out of Food in the Last Year: Never true    Ran Out of Food in the Last Year: Never true  Transportation Needs: No Transportation Needs (02/20/2023)   PRAPARE - Administrator, Civil Service (Medical): No    Lack of Transportation (Non-Medical): No  Physical Activity: Inactive (02/20/2023)   Exercise Vital Sign    Days of Exercise per Week: 0 days    Minutes of Exercise per Session: 0 min  Stress: No Stress Concern Present (02/20/2023)   [employer] of Occupational Health - Occupational Stress Questionnaire    Feeling of Stress : Only a little  Social Connections: Not on file    Tobacco Counseling Counseling given: Not Answered   Clinical Intake:  Pre-visit preparation completed: Yes  Pain : 0-10 Pain Score: 7  Pain Type: Chronic pain Pain Location: Generalized Pain Descriptors / Indicators: Aching Pain Onset: More than a month ago Pain Frequency: Constant     Nutritional Status: BMI > 30  Obese Nutritional Risks: None Diabetes: No  How often do you need to have someone help you when you read  instructions, pamphlets, or other written materials from your doctor or pharmacy?: 1 - Never  Diabetic? no  Interpreter Needed?: No  Information entered by :: NAllen LPN   Activities of Daily Living    02/20/2023   10:06 AM  In your present state of health, do you have any difficulty performing the following activities:  Hearing? 0  Vision? 1  Comment blurry sometimes  Difficulty concentrating or making decisions? 0  Walking or climbing stairs? 1  Comment due to pains  Dressing or bathing? 0  Doing errands, shopping? 0  Preparing Food and eating ? N  Using the Toilet? N  In the past six months, have you accidently leaked urine? Y  Do you have problems with loss of bowel control? N  Managing your Medications? N  Managing your Finances? N  Housekeeping or managing your Housekeeping? N    Patient Care Team: Dorothyann Peng, MD as PCP - General (Internal Medicine) Dione Booze, Bertram Millard, MD as Consulting Physician (Ophthalmology)  Indicate any recent Medical Services you may have received from other than [facility] providers in the past year (date may be approximate).     Assessment:   This is a routine wellness examination for Ezelle.  Hearing/Vision screen Vision Screening - Comments:: Regular eye exams, [facility]  Dietary issues and exercise activities discussed: Current Exercise Habits: The patient does not participate in regular exercise at present   Goals Addressed             This Visit's Progress    Patient Stated       02/20/2023, wants to lose weight       Depression Screen    02/20/2023   10:05 AM 11/22/2022   10:45 AM 08/14/2022    9:58 AM 02/08/2022    9:01 AM 01/25/2021    3:23 PM 12/24/2019   11:36  AM 01/12/2019   12:12 PM  PHQ 2/9 Scores  PHQ - 2 Score 0 0 0 0 0 0 0  PHQ- 9 Score      0     Fall Risk    02/20/2023   10:05 AM 11/22/2022   10:45 AM 08/14/2022    9:59 AM 02/08/2022    9:01 AM 01/25/2021    3:22 PM  Fall Risk   Falls in the past year?  0 0 0 0 0  Number falls in past yr: 0 0 0 0   Injury with Fall? 0 0 0 0   Risk for fall due to : Medication side effect No Fall Risks No Fall Risks Medication side effect Medication side effect;Impaired balance/gait  Follow up Falls prevention discussed;Education provided;Falls evaluation completed Falls evaluation completed Falls evaluation completed Falls evaluation completed;Education provided;Falls prevention discussed Falls evaluation completed;Education provided;Falls prevention discussed    FALL RISK PREVENTION PERTAINING TO THE HOME:  Any stairs in or around the home? Yes  If so, are there any without handrails? No  Home free of loose throw rugs in walkways, pet beds, electrical cords, etc? Yes  Adequate lighting in your home to reduce risk of falls? Yes   ASSISTIVE DEVICES UTILIZED TO PREVENT FALLS:  Life alert? No  Use of a cane, walker or w/c? No  Grab bars in the bathroom? Yes  Shower chair or bench in shower? Yes  Elevated toilet seat or a handicapped toilet? Yes   TIMED UP AND GO:  Was the test performed? Yes .  Length of time to ambulate 10 feet: 5 sec.   Gait steady and fast without use of assistive device  Cognitive Function:        02/20/2023   10:08 AM 02/08/2022    9:03 AM 01/25/2021    3:25 PM 12/24/2019   11:38 AM 12/17/2018   12:10 PM  6CIT Screen  What Year? 0 points 0 points 0 points 0 points 0 points  What month? 0 points 0 points 0 points 0 points 0 points  What time? 0 points 0 points 0 points 0 points 0 points  Count back from 20 0 points 0 points 0 points 0 points 0 points  Months in reverse 0 points 0 points 0 points 0 points 0 points  Repeat phrase 2 points 0 points 2 points 0 points 0 points  Total Score 2 points 0 points 2 points 0 points 0 points    Immunizations Immunization History  Administered Date(s) Administered   Fluad Quad(high Dose 65+) 06/29/2020, 06/28/2021, 08/14/2022   Influenza, High Dose Seasonal PF 10/23/2017, 08/11/2018,  06/17/2019   PFIZER(Purple Top)SARS-COV-2 Vaccination 11/12/2019, 12/04/2019, 07/26/2020   Pneumococcal Conjugate-13 11/08/2017   Pneumococcal Polysaccharide-23 02/22/2021   Tdap 08/23/2017   Zoster Recombinat (Shingrix) 07/14/2021    TDAP status: Up to date  Flu Vaccine status: Up to date  Pneumococcal vaccine status: Up to date  Covid-19 vaccine status: Completed vaccines  Qualifies for Shingles Vaccine? Yes   Zostavax completed No   Shingrix Completed?: needs second dose  Screening Tests Health Maintenance  Topic Date Due   COVID-19 Vaccine (4 - 2023-24 season) 06/22/2022   Medicare Annual Wellness (AWV)  02/09/2023   Zoster Vaccines- Shingrix (2 of 2) 05/23/2023 (Originally 09/08/2021)   INFLUENZA VACCINE  05/23/2023   MAMMOGRAM  03/20/2024   COLONOSCOPY (Pts 45-11yrs Insurance coverage will need to be confirmed)  04/09/2027   DTaP/Tdap/Td (2 - Td or Tdap) 08/24/2027  Pneumonia Vaccine 41+ Years old  Completed   DEXA SCAN  Completed   Hepatitis C Screening  Completed   HPV VACCINES  Aged Out    Health Maintenance  Health Maintenance Due  Topic Date Due   COVID-19 Vaccine (4 - 2023-24 season) 06/22/2022   Medicare Annual Wellness (AWV)  02/09/2023    Colorectal cancer screening: Type of screening: Colonoscopy. Completed 04/08/2017. Repeat every 5 years  Mammogram status: Completed 03/20/2022. Repeat every year  Bone Density status: Completed 09/27/2015  Lung Cancer Screening: (Low Dose CT Chest recommended if Age 24-80 years, 30 pack-year currently smoking OR have quit w/in 15years.) does not qualify.   Lung Cancer Screening Referral: no  Additional Screening:  Hepatitis C Screening: does qualify; Completed 05/11/2018  Vision Screening: Recommended annual ophthalmology exams for early detection of glaucoma and other disorders of the eye. Is the patient up to date with their annual eye exam?  Yes  Who is the provider or what is the name of the office in  which the patient attends annual eye exams? [facility] If pt is not established with a provider, would they like to be referred to a provider to establish care? No .   Dental Screening: Recommended annual dental exams for proper oral hygiene  Community Resource Referral / Chronic Care Management: CRR required this visit?  No   CCM required this visit?  No      Plan:     I have personally reviewed and noted the following in the patient's chart:   Medical and social history Use of alcohol, tobacco or illicit drugs  Current medications and supplements including opioid prescriptions. Patient is not currently taking opioid prescriptions. Functional ability and status Nutritional status Physical activity Advanced directives List of other physicians Hospitalizations, surgeries, and ER visits in previous 12 months Vitals Screenings to include cognitive, depression, and falls Referrals and appointments  In addition, I have reviewed and discussed with patient certain preventive protocols, quality metrics, and best practice recommendations. A written personalized care plan for preventive services as well as general preventive health recommendations were provided to patient.     Barb Merino, LPN   10/22/9145   Nurse Notes: none

## 2023-02-22 ENCOUNTER — Telehealth: Payer: Self-pay

## 2023-02-22 NOTE — Telephone Encounter (Signed)
Called to discuss PREP program referral, she would like to attend June 3 class at [facility], every M/w 12:30-1:45; will contact mid-May to set up assessment visit

## 2023-02-25 ENCOUNTER — Encounter: Payer: Self-pay | Admitting: Internal Medicine

## 2023-02-25 DIAGNOSIS — H40023 Open angle with borderline findings, high risk, bilateral: Secondary | ICD-10-CM | POA: Diagnosis not present

## 2023-02-25 DIAGNOSIS — Z961 Presence of intraocular lens: Secondary | ICD-10-CM | POA: Diagnosis not present

## 2023-03-02 ENCOUNTER — Emergency Department (HOSPITAL_COMMUNITY)
Admission: EM | Admit: 2023-03-02 | Discharge: 2023-03-02 | Disposition: A | Discharge status: Home / Self Care | Payer: Medicare Other | Attending: Emergency Medicine | Admitting: Emergency Medicine

## 2023-03-02 ENCOUNTER — Encounter (HOSPITAL_COMMUNITY): Payer: Self-pay

## 2023-03-02 ENCOUNTER — Emergency Department (HOSPITAL_COMMUNITY): Payer: Medicare Other

## 2023-03-02 ENCOUNTER — Other Ambulatory Visit: Payer: Self-pay

## 2023-03-02 DIAGNOSIS — M545 Low back pain, unspecified: Secondary | ICD-10-CM | POA: Diagnosis not present

## 2023-03-02 DIAGNOSIS — R6883 Chills (without fever): Secondary | ICD-10-CM | POA: Insufficient documentation

## 2023-03-02 DIAGNOSIS — M5441 Lumbago with sciatica, right side: Secondary | ICD-10-CM | POA: Insufficient documentation

## 2023-03-02 LAB — URINALYSIS, ROUTINE W REFLEX MICROSCOPIC
Bilirubin Urine: NEGATIVE
Glucose, UA: NEGATIVE mg/dL
Hgb urine dipstick: NEGATIVE
Ketones, ur: NEGATIVE mg/dL
Nitrite: NEGATIVE
Protein, ur: NEGATIVE mg/dL
Specific Gravity, Urine: 1.01 (ref 1.005–1.030)
pH: 7 (ref 5.0–8.0)

## 2023-03-02 MED ORDER — METHOCARBAMOL 500 MG PO TABS: 500.0000 mg | ORAL_TABLET | Freq: Two times a day (BID) | ORAL | 0 refills | Status: DC | PRN

## 2023-03-02 MED ORDER — LIDOCAINE 5 % EX PTCH: 1.0000 | MEDICATED_PATCH | CUTANEOUS | 0 refills | Status: DC

## 2023-03-02 MED ORDER — ACETAMINOPHEN 500 MG PO TABS
1000.0000 mg | ORAL_TABLET | Freq: Once | ORAL | Status: AC
  Administered 2023-03-02: 1000 mg via ORAL
  Filled 2023-03-02: qty 2

## 2023-03-02 MED ORDER — LIDOCAINE 5 % EX PTCH
1.0000 | MEDICATED_PATCH | Freq: Once | CUTANEOUS | Status: DC
  Administered 2023-03-02: 1 via TRANSDERMAL
  Filled 2023-03-02: qty 1

## 2023-03-02 MED ORDER — OXYCODONE HCL 5 MG PO TABS
5.0000 mg | ORAL_TABLET | Freq: Once | ORAL | Status: AC
  Administered 2023-03-02: 5 mg via ORAL
  Filled 2023-03-02: qty 1

## 2023-03-02 NOTE — ED Triage Notes (Addendum)
C/o intermittent right lower back pain x1 week with chills.  Pain is better laying flat on stomach.  Relief with robaxin last night.  Denies fall/trauma.  Pt seen for same 3/18 at urgent care.

## 2023-03-02 NOTE — ED Notes (Signed)
Patient transported to CT 

## 2023-03-02 NOTE — Discharge Instructions (Signed)
It was a pleasure taking care of you today!   Your CT was negative for fracture or dislocation at this time. It did show degenerative changes today. Attached is information for the on-call Spine specialist, you may call and set up a follow up appointment regarding todays ED visit. You are prescribed Robaxin and lidoderm patches.  Take medications as prescribed.  Do not operate any heavy machinery or drive while taking Robaxin as it can make you sleepy/drowsy.  You will also be sent a prescription for lidoderm patch, remove and replace with a new patch every 12 hours. You may take over the counter 500 mg tylenol every 6 hours as directed for no more than 7 days. You may apply heat to the affected area for up to 15 minutes at a time.  Ensure to place a barrier between your skin and the heat. Return to the Emergency Department if you are experiencing loss of bowel or bladder, increasing/worsening symptoms, fever, inability to walk.

## 2023-03-02 NOTE — ED Provider Notes (Signed)
[place] [facility] DEPARTMENT AT [facility] Provider Note   CSN: [id number] Arrival date & time: 03/02/23  1028     History  Chief Complaint  Patient presents with   Back Pain    [name] is a 72 y.o. female who presents to the ED with concerns for intermittent right lower back pain x 1 week.  Has associated chills.  Her pain is better when she lays on her stomach.  Denies recent injury, trauma, fall, heavy lifting.  Was evaluated for similar symptoms in March 2024 at urgent care where she was prescribed Robaxin. However, notes today her back pain feels worse.  Tried Robaxin last night with relief of her symptoms.  Denies bowel/bladder incontinence, urinary symptoms, numbness, tingling, saddle paresthesia, abdominal pain, nausea, vomiting. Denies history of UTI or kidney stones.   The history is provided by the patient. No language interpreter was used.       Home Medications Prior to Admission medications   Medication Sig Start Date End Date Taking? Authorizing Provider  lidocaine (LIDODERM) 5 % Place 1 patch onto the skin daily. Remove & Discard patch within 12 hours or as directed by MD 03/02/23  Yes Khiyan Crace A, PA-C  acetaminophen (TYLENOL) 325 MG tablet Take 650 mg by mouth every 6 (six) hours as needed for headache.    [provider]  Cholecalciferol (DIALYVITE VITAMIN D 5000) 125 MCG (5000 UT) capsule Take 5,000 Units by mouth daily.    [provider]  methocarbamol (ROBAXIN) 500 MG tablet Take 1 tablet (500 mg total) by mouth 2 (two) times daily as needed for muscle spasms. 03/02/23   Akiah Bauch A, PA-C  methylPREDNISolone (MEDROL DOSEPAK) 4 MG TBPK tablet Take as directed Patient not taking: Reported on 02/20/2023 06/01/22   Louann Sjogren, DPM  nebivolol (BYSTOLIC) 5 MG tablet TAKE 1 TABLET DAILY 01/22/23   Dorothyann Peng, MD  ofloxacin (OCUFLOX) 0.3 % ophthalmic solution INSTILL 1 DROP IN RIGHT EYE FOUR TIMES DAILY. START 1 DAY  BEFORE SURGERY Patient not taking: Reported on 11/22/2022    [provider]  ondansetron (ZOFRAN-ODT) 4 MG disintegrating tablet Take 1 tablet (4 mg total) by mouth every 8 (eight) hours as needed for nausea or vomiting. 02/08/22   Dorothyann Peng, MD  prednisoLONE acetate (PRED FORTE) 1 % ophthalmic suspension SHAKE LIQUID AND INSTILL 1 DROP IN RIGHT EYE FOUR TIMES DAILY AFTER SURGERY Patient not taking: Reported on 11/22/2022    [provider]  pregabalin (LYRICA) 75 MG capsule Take 1 capsule (75 mg total) by mouth daily. 02/20/23 05/21/23  Dorothyann Peng, MD  rosuvastatin (CRESTOR) 10 MG tablet TAKE 1 TABLET DAILY 05/23/22   Dorothyann Peng, MD  SYSTANE 0.4-0.3 % GEL ophthalmic gel SMARTSIG:1 Drop(s) In Eye(s) PRN Patient not taking: Reported on 11/22/2022 12/13/21   [provider]  traMADol (ULTRAM) 50 MG tablet Take 1 tablet (50 mg total) by mouth 2 (two) times daily as needed. Patient not taking: Reported on 11/22/2022 08/14/22   Dorothyann Peng, MD  triamcinolone ointment (KENALOG) 0.1 % APPLY TOPICALLY TO THE AFFECTED AREA TWICE DAILY Patient not taking: Reported on 08/14/2022    [provider]  triamterene-hydrochlorothiazide (MAXZIDE-25) 37.5-25 MG tablet TAKE 1 TABLET BY MOUTH DAILY 10/29/22   Dorothyann Peng, MD  zolpidem (AMBIEN) 5 MG tablet Take 1 tablet (5 mg total) by mouth at bedtime as needed. for sleep 09/04/22   Dorothyann Peng, MD      Allergies  Aspirin, Ibuprofen, Quinapril hcl, Penicillin g, Quinapril, Penicillins, and Pravastatin    Review of Systems   Review of Systems  Musculoskeletal:  Positive for back pain.  All other systems reviewed and are negative.   Physical Exam Updated Vital Signs BP (!) 116/48 (BP Location: Left Arm)   Pulse 79   Temp 97.8 F (36.6 C) (Oral)   Resp 18   Wt 74 kg   SpO2 100%   BMI 31.86 kg/m  Physical Exam Vitals and nursing note reviewed.  Constitutional:      General: She is not in acute distress.     Appearance: Normal appearance.  Eyes:     General: No scleral icterus.    Extraocular Movements: Extraocular movements intact.  Cardiovascular:     Rate and Rhythm: Normal rate and regular rhythm.     Pulses: Normal pulses.     Heart sounds: Normal heart sounds.  Pulmonary:     Effort: Pulmonary effort is normal. No respiratory distress.     Breath sounds: Normal breath sounds.  Abdominal:     General: Bowel sounds are normal.     Palpations: Abdomen is soft. There is no mass.     Tenderness: There is no abdominal tenderness. There is right CVA tenderness. There is no left CVA tenderness.     Comments: No abdominal TTP. TTP noted to right CVA.   Musculoskeletal:        General: Normal range of motion.     Cervical back: Neck supple.     Comments: S spine TTP. TTP noted to lower musculature of back, more so to right lumbar musculature. Positive SLR on right. Negative SLR on left.   Skin:    General: Skin is warm and dry.     Findings: No rash.  Neurological:     Mental Status: She is alert.     Sensory: Sensation is intact.     Motor: Motor function is intact.  Psychiatric:        Behavior: Behavior normal.     ED Results / Procedures / Treatments   Labs (all labs ordered are listed, but only abnormal results are displayed) Labs Reviewed  URINALYSIS, ROUTINE W REFLEX MICROSCOPIC - Abnormal; Notable for the following components:      Result Value   Leukocytes,Ua TRACE (*)    Bacteria, UA RARE (*)    All other components within normal limits    EKG None  Radiology CT Lumbar Spine Wo Contrast  Result Date: 03/02/2023 CLINICAL DATA:  Low back pain. Intermittent right lower back pain for 1 week with chills. EXAM: CT LUMBAR SPINE WITHOUT CONTRAST TECHNIQUE: Multidetector CT imaging of the lumbar spine was performed without intravenous contrast administration. Multiplanar CT image reconstructions were also generated. RADIATION DOSE REDUCTION: This exam was performed according  to the departmental dose-optimization program which includes automated exposure control, adjustment of the mA and/or kV according to patient size and/or use of iterative reconstruction technique. COMPARISON:  None Available. FINDINGS: Segmentation: 5 lumbar type vertebrae Alignment: Scoliosis. Vertebrae: No acute fracture or focal pathologic process. Paraspinal and other soft tissues: Negative. Disc levels: Lower thoracic spine: Generalized disc space narrowing and ridging. T12- L1: Narrow disc with probable left foraminal protrusion. Mild to moderate left foraminal narrowing L1-L2: Disc narrowing and bulging with endplate and facet spurring. The canal and foramina are patent L2-L3: Disc narrowing and bulging with endplate and facet spurring eccentric to the right. Moderate spinal and moderate to advanced right foraminal stenosis  L3-L4: Disc collapse and endplate degeneration with degenerative sclerosis. Facet spurring greater on the right. Right more than left foraminal impingement. Mild spinal stenosis L4-L5: Facet degeneration with bulky spurring. The disc is narrowed and bulging. Advanced spinal stenosis. Right more than left foraminal impingement L5-S1:Disc narrowing and bulging with degenerative facet spurring on both sides. Biforaminal impingement. Mild spinal stenosis. IMPRESSION: 1. No acute finding.  No visible inflammation. 2. Advanced and generalized lumbar spine degeneration with scoliosis. Canal and foraminal narrowings as described above. Electronically Signed   By: Tiburcio Pea M.D.   On: 03/02/2023 12:49    Procedures Procedures    Medications Ordered in ED Medications  lidocaine (LIDODERM) 5 % 1 patch (1 patch Transdermal Patch Applied 03/02/23 1132)  acetaminophen (TYLENOL) tablet 1,000 mg (1,000 mg Oral Given 03/02/23 1134)  oxyCODONE (Oxy IR/ROXICODONE) immediate release tablet 5 mg (5 mg Oral Given 03/02/23 1331)    ED Course/ Medical Decision Making/ A&P Clinical Course as of  03/02/23 1623  Sat Mar 02, 2023  1102 Offered steroids for treatment of sciatica, patient declines at this time.  [SB]  1216 Re-evaluated and noted some improvement of symptoms with treatment regimen. Discussed with patient lab and imaging findings. Discussed discharge treatment plan. Answered all available questions. Pt appears safe for discharge at this time. [SB]    Clinical Course User Index [SB] Leronda Lewers A, PA-C                             Medical Decision Making Amount and/or Complexity of Data Reviewed Labs: ordered. Radiology: ordered.  Risk OTC drugs. Prescription drug management.   Patient with right lumbar back pain without radiation. No history of sciatica. No neurological deficits and normal neuro exam.  Patient is ambulatory without assistance or difficulty.  No loss of bowel or bladder control.  No concern for cauda equina.  No fever, night sweats, weight loss, h/o cancer, IVDA, no recent procedure to back. No urinary symptoms suggestive of UTI.  Vital signs, pt afebrile. On exam, patient with S spine TTP. TTP noted to lower musculature of back, more so to right lumbar musculature. Positive SLR on right. Negative SLR on left. No abdominal TTP. Right CVA TTP. No concerning findings on exams. Differential diagnosis includes but is not limited to fracture, herniation, muscle strain, cauda equina, pancreatitis, appendicitis, pyelonephritis, nephrolithiasis.    Additional history obtained:  External records from outside source obtained and reviewed including: Pt was evaluated for similar symptoms on 01/07/23 for back pain and discharged home with robaxin.  Labs:  I ordered, and personally interpreted labs.  The pertinent results include:   Urinalysis unremarkable  Imaging: I ordered imaging studies including CT lumbar spine I independently visualized and interpreted imaging which showed:  1. No acute finding.  No visible inflammation.  2. Advanced and generalized  lumbar spine degeneration with  scoliosis. Canal and foraminal narrowings as described above.   I agree with the radiologist interpretation  Medications:  I ordered medication including tylenol, roxicodone, warm compress, lidoderm patch for pain management Reevaluation of the patient after these medicines and interventions, I reevaluated the patient and found that they have improved I have reviewed the patients home medicines and have made adjustments as needed   Disposition: Presentation suspicious for sciatica on the right side.  Doubt concerns this time for fracture, dislocation, herniation, cauda equina, appendicitis, pyelonephritis, nephrolithiasis. After consideration of the diagnostic results and the patients response to  treatment, I feel that the patient would benefit from Discharge home.  Opted to send patient home with a prescription for prednisone, patient declines at this time.  Patient sent home with a refill of her Robaxin and prescription for Lidoderm patch.  Patient provided with information for [place] [facility] to follow-up as needed.  Discussed importance of follow-up with primary care provider for management.  Supportive care measures and strict return precautions discussed with patient at bedside. Pt acknowledges and verbalizes understanding. Pt appears safe for discharge. Follow up as indicated in discharge paperwork.    This chart was dictated using voice recognition software, Dragon. Despite the best efforts of this provider to proofread and correct errors, errors may still occur which can change documentation meaning.   Final Clinical Impression(s) / ED Diagnoses Final diagnoses:  Acute right-sided low back pain with right-sided sciatica    Rx / DC Orders ED Discharge Orders          Ordered    lidocaine (LIDODERM) 5 %  Every 24 hours        03/02/23 1349    methocarbamol (ROBAXIN) 500 MG tablet  2 times daily PRN        03/02/23 1349              Duey Liller,  Vinie Charity A, PA-C 03/02/23 1623    Wynetta Fines, MD 03/03/23 [phone]

## 2023-03-06 ENCOUNTER — Telehealth (INDEPENDENT_AMBULATORY_CARE_PROVIDER_SITE_OTHER): Payer: Medicare Other | Admitting: Internal Medicine

## 2023-03-06 ENCOUNTER — Telehealth: Payer: Self-pay

## 2023-03-06 DIAGNOSIS — M5136 Other intervertebral disc degeneration, lumbar region: Secondary | ICD-10-CM

## 2023-03-06 DIAGNOSIS — M48061 Spinal stenosis, lumbar region without neurogenic claudication: Secondary | ICD-10-CM | POA: Diagnosis not present

## 2023-03-06 DIAGNOSIS — M5441 Lumbago with sciatica, right side: Secondary | ICD-10-CM | POA: Diagnosis not present

## 2023-03-06 MED ORDER — TRAMADOL HCL 50 MG PO TABS
50.0000 mg | ORAL_TABLET | Freq: Two times a day (BID) | ORAL | 0 refills | Status: DC | PRN
Start: 2023-03-06 — End: 2023-12-04

## 2023-03-06 NOTE — Transitions of Care (Post Inpatient/ED Visit) (Signed)
03/06/2023  Name: [name] MRN: [id number] DOB: [birth year]  Today's TOC FU Call Status:    Transition Care Management Follow-up Telephone Call Date of Discharge: 03/02/23 Discharge Facility: [facility]) Type of Discharge: Emergency Department Reason for ED Visit: Other: How have you been since you were released from the hospital?: Better Any questions or concerns?: No  Items Reviewed: Did you receive and understand the discharge instructions provided?: Yes Medications obtained,verified, and reconciled?: Yes (Medications Reviewed) Any new allergies since your discharge?: No Dietary orders reviewed?: NA Do you have support at home?: Yes People in Home: child(ren), adult Name of Support/Comfort Primary Source: Mal Misty  Medications Reviewed Today: Medications Reviewed Today     Reviewed by Coolidge Breeze, CMA (Certified Medical Assistant) on 03/06/23 at [phone]  Med List Status: <None>   Medication Order Taking? Sig Documenting Provider Last Dose Status Informant  acetaminophen (TYLENOL) 325 MG tablet [id number] Yes Take 650 mg by mouth every 6 (six) hours as needed for headache. [provider] Taking Active Self  Cholecalciferol (DIALYVITE VITAMIN D 5000) 125 MCG (5000 UT) capsule [id number] Yes Take 5,000 Units by mouth daily. [provider] Taking Active Self  lidocaine (LIDODERM) 5 % [id number] Yes Place 1 patch onto the skin daily. Remove & Discard patch within 12 hours or as directed by MD Blue, Soijett A, PA-C Taking Active   methocarbamol (ROBAXIN) 500 MG tablet [id number] Yes Take 1 tablet (500 mg total) by mouth 2 (two) times daily as needed for muscle spasms. Blue, Soijett A, PA-C Taking Active   methylPREDNISolone (MEDROL DOSEPAK) 4 MG TBPK tablet [id number] No Take as directed  Patient not taking: Reported on 02/20/2023   Louann Sjogren, DPM Not Taking Active   nebivolol (BYSTOLIC) 5 MG tablet [id number] Yes TAKE 1 TABLET DAILY Dorothyann Peng, MD Taking Active   ofloxacin (OCUFLOX) 0.3 % ophthalmic solution [id number] No INSTILL 1 DROP IN RIGHT EYE FOUR TIMES DAILY. START 1 DAY BEFORE SURGERY  Patient not taking: Reported on 11/22/2022   [provider] Not Taking Active   ondansetron (ZOFRAN-ODT) 4 MG disintegrating tablet [id number] Yes Take 1 tablet (4 mg total) by mouth every 8 (eight) hours as needed for nausea or vomiting. Dorothyann Peng, MD Taking Active   prednisoLONE acetate (PRED FORTE) 1 % ophthalmic suspension [id number] No SHAKE LIQUID AND INSTILL 1 DROP IN RIGHT EYE FOUR TIMES DAILY AFTER SURGERY  Patient not taking: Reported on 11/22/2022   [provider] Not Taking Active   pregabalin (LYRICA) 75 MG capsule [id number] Yes Take 1 capsule (75 mg total) by mouth daily. Dorothyann Peng, MD Taking Active   rosuvastatin (CRESTOR) 10 MG tablet [id number] Yes TAKE 1 TABLET DAILY Dorothyann Peng, MD Taking Active   SYSTANE 0.4-0.3 % GEL ophthalmic gel [id number] No SMARTSIG:1 Drop(s) In Eye(s) PRN  Patient not taking: Reported on 11/22/2022   [provider] Not Taking Active   traMADol (ULTRAM) 50 MG tablet [id number] No Take 1 tablet (50 mg total) by mouth 2 (two) times daily as needed.  Patient not taking: Reported on 11/22/2022   Dorothyann Peng, MD Not Taking Active   triamcinolone ointment (KENALOG) 0.1 % [id number] No APPLY TOPICALLY TO THE AFFECTED AREA TWICE DAILY  Patient not taking: Reported on 08/14/2022   [provider] Not Taking Active   triamterene-hydrochlorothiazide (MAXZIDE-25) 37.5-25 MG tablet [id number] Yes TAKE 1 TABLET BY MOUTH DAILY Dorothyann Peng, MD Taking Active   zolpidem (AMBIEN) 5 MG tablet [id number] Yes  Take 1 tablet (5 mg total) by mouth at bedtime as needed. for sleep Dorothyann Peng, MD Taking Active             Home Care and Equipment/Supplies: Were Home Health Services Ordered?: No Any new equipment or medical supplies ordered?: No  Functional  Questionnaire: Do you need assistance with bathing/showering or dressing?: No Do you need assistance with meal preparation?: No Do you need assistance with eating?: No Do you have difficulty maintaining continence: No Do you need assistance with getting out of bed/getting out of a chair/moving?: No Do you have difficulty managing or taking your medications?: No  Follow up appointments reviewed: PCP Follow-up appointment confirmed?: Yes Date of PCP follow-up appointment?: 03/06/23 Follow-up Provider: Dorothyann Peng Specialist [facility] Follow-up appointment confirmed?: No Reason Specialist Follow-Up Not Confirmed: Patient has Specialist Provider Number and will Call for Appointment Do you need transportation to your follow-up appointment?: No Do you understand care options if your condition(s) worsen?: Yes-patient verbalized understanding    SIGNATURE Randa Lynn, CMA

## 2023-03-06 NOTE — Progress Notes (Unsigned)
Virtual Visit via Video   This visit type was conducted due to national recommendations for restrictions regarding the COVID-19 Pandemic (e.g. social distancing) in an effort to limit this patient's exposure and mitigate transmission in our community.  Due to her co-morbid illnesses, this patient is at least at moderate risk for complications without adequate follow up.  This format is felt to be most appropriate for this patient at this time.  All issues noted in this document were discussed and addressed.  A limited physical exam was performed with this format.    This visit type was conducted due to national recommendations for restrictions regarding the COVID-19 Pandemic (e.g. social distancing) in an effort to limit this patient's exposure and mitigate transmission in our community.  Patients identity confirmed using two different identifiers.  This format is felt to be most appropriate for this patient at this time.  All issues noted in this document were discussed and addressed.  No physical exam was performed (except for noted visual exam findings with Video Visits).    Date:  03/10/2023   ID:  [name], DOB 03-13-1951, MRN [id number]  Patient Location:  Home  Provider location:   Office    Chief Complaint:  "I have ER f/u"  History of Present Illness:    [name] is a 72 y.o. female who presents via video conferencing for a telehealth visit today.    The patient does not have symptoms concerning for COVID-19 infection (fever, chills, cough, or new shortness of breath).   Patient presents today for virtual visit. She presents for ED f/u. She presented to [facility] on 5/11 for further evaluation back pain.  She had been having intermittent back pain for the past month. Previously seen by Urgent Care, prescribed Robaxin, which had not been helpful.  She denies bowel/bladder incontinence, urinary symptoms, numbness, tingling, abdominal pain, nausea, and vomiting.  Denies history of UTI or kidney stones.    Since her discharge, back pain is persistent. She was prescribed lidocaine patch, this has not helped her sx.        Past Medical History:  Diagnosis Date   Aortic atherosclerosis (HCC) 04/16/2021   Arthritis    Cataract    Diverticulosis    H/O blood transfusion reaction 2010   hives   H/O migraine    last 16yrs ago   Headache(784.0)    Heart murmur    takes Bystolic nightly   History of blood clots 51yrs   was on Lovenox injections and Coumadin(was only on that for short period of time)   History of shingles 3-58yrs ago   Hx of seasonal allergies    takes Levocetirizine prn   Hyperlipidemia    takes Crestor nightly   Hypertension    takes Maxzide daily   Insomnia    takes Ambien prn   Joint pain    Joint swelling    Moderate tricuspid regurgitation    Pre-diabetes    Trigeminal neuralgia of right side of face 03/26/2016   V2 distribution   Past Surgical History:  Procedure Laterality Date   ABDOMINAL HYSTERECTOMY     BREAST BIOPSY  05/03/2006   [place]   BREAST REDUCTION SURGERY  1987   BUNIONECTOMY Right 05/11/2019   CATARACT EXTRACTION Left 07/2019   CHOLECYSTECTOMY     COLONOSCOPY     ESOPHAGOGASTRODUODENOSCOPY     EYE SURGERY Right    eye lash removed   JOINT REPLACEMENT  bilateral hip rt in 2007 and lt in 2010   NM MYOCAR PERF WALL MOTION  11/04/2008   Normal   REDUCTION MAMMAPLASTY Bilateral 1988   REDUCTION MAMMAPLASTY     SHOULDER ARTHROSCOPY WITH DISTAL CLAVICLE RESECTION Right 09/21/2015   Procedure: RIGHT SHOULDER ARTHROSCOPY WITH DISTAL CLAVICLE EXCISION DEBRIDE LABRAL TEAR ACROMIOPLASTY ;  Surgeon: Gean Birchwood, MD;  Location: [facility];  Service: Orthopedics;  Laterality: Right;   TEE WITHOUT CARDIOVERSION N/A 04/12/2015   Procedure: TRANSESOPHAGEAL ECHOCARDIOGRAM (TEE)/BUBBLE STUDY;  Surgeon: Yates Decamp, MD;  Location: [facility] ENDOSCOPY;  Service: Cardiovascular;  Laterality: N/A;   TOTAL HIP  REVISION  04/14/2012   Procedure: TOTAL HIP REVISION;  Surgeon: [name], MD;  Location: [facility] OR;  Service: Orthopedics;  Laterality: Right;  right total hip revision   TOTAL HIP REVISION Left 11/30/2013   Procedure: TOTAL HIP REVISION;  Surgeon: [name], MD;  Location: [facility] OR;  Service: Orthopedics;  Laterality: Left;   TOTAL KNEE ARTHROPLASTY Right 12/13/2020   Procedure: TOTAL KNEE ARTHROPLASTY;  Surgeon: Durene Romans, MD;  Location: [facility];  Service: Orthopedics;  Laterality: Right;  70 mins     Current Meds  Medication Sig   acetaminophen (TYLENOL) 325 MG tablet Take 650 mg by mouth every 6 (six) hours as needed for headache.   Cholecalciferol (DIALYVITE VITAMIN D 5000) 125 MCG (5000 UT) capsule Take 5,000 Units by mouth daily.   lidocaine (LIDODERM) 5 % Place 1 patch onto the skin daily. Remove & Discard patch within 12 hours or as directed by MD   methocarbamol (ROBAXIN) 500 MG tablet Take 1 tablet (500 mg total) by mouth 2 (two) times daily as needed for muscle spasms.   nebivolol (BYSTOLIC) 5 MG tablet TAKE 1 TABLET DAILY   ondansetron (ZOFRAN-ODT) 4 MG disintegrating tablet Take 1 tablet (4 mg total) by mouth every 8 (eight) hours as needed for nausea or vomiting.   pregabalin (LYRICA) 75 MG capsule Take 1 capsule (75 mg total) by mouth daily.   rosuvastatin (CRESTOR) 10 MG tablet TAKE 1 TABLET DAILY   triamterene-hydrochlorothiazide (MAXZIDE-25) 37.5-25 MG tablet TAKE 1 TABLET BY MOUTH DAILY   zolpidem (AMBIEN) 5 MG tablet Take 1 tablet (5 mg total) by mouth at bedtime as needed. for sleep     Allergies:   Aspirin, Ibuprofen, Quinapril hcl, Penicillin g, Quinapril, Penicillins, and Pravastatin   Social History   Tobacco Use   Smoking status: Former    Packs/day: 0.25    Years: 42.00    Additional pack years: 0.00    Total pack years: 10.50    Types: Cigarettes    Quit date: 10/22/2018    Years since quitting: 4.3   Smokeless tobacco: Never  Vaping Use   Vaping Use:  Never used  Substance Use Topics   Alcohol use: Not Currently    Comment: lst dirnk 2 years ago    Drug use: No     Family Hx: The patient's family history includes Alzheimer's disease in her mother; Diabetes in her sister; Heart attack in her mother; Hypertension in her sister.  ROS:   Please see the history of present illness.    Review of Systems  Constitutional: Negative.   HENT: Negative.    Respiratory: Negative.    Genitourinary: Negative.   Neurological: Negative.   Endo/Heme/Allergies: Negative.   Psychiatric/Behavioral: Negative.      All other systems reviewed and are negative.   Labs/Other Tests and Data Reviewed:  Recent Labs: 08/14/2022: ALT 8 11/22/2022: BUN 24; Creatinine, Ser 0.80; Hemoglobin 14.2; Platelets 183; Potassium 4.1; Sodium 144; TSH 1.200   Recent Lipid Panel Lab Results  Component Value Date/Time   CHOL 141 02/08/2022 11:09 AM   TRIG 73 02/08/2022 11:09 AM   HDL 63 02/08/2022 11:09 AM   CHOLHDL 2.2 02/08/2022 11:09 AM   CHOLHDL 2.1 04/15/2021 03:00 PM   LDLCALC 64 02/08/2022 11:09 AM    Wt Readings from Last 3 Encounters:  03/02/23 163 lb 2.3 oz (74 kg)  02/20/23 164 lb (74.4 kg)  02/20/23 164 lb 9.6 oz (74.7 kg)     Exam:    Vital Signs:  There were no vitals taken for this visit.    Physical Exam Vitals and nursing note reviewed.  Constitutional:      Appearance: Normal appearance.  HENT:     Head: Normocephalic and atraumatic.  Eyes:     Extraocular Movements: Extraocular movements intact.  Pulmonary:     Effort: Pulmonary effort is normal.  Musculoskeletal:     Cervical back: Normal range of motion.  Neurological:     Mental Status: She is alert and oriented to person, place, and time.  Psychiatric:        Mood and Affect: Affect normal.     ASSESSMENT & PLAN:    1. Acute right-sided low back pain with right-sided sciatica Comments: ER records reviewed,CT lumbar spine reviewed. PDMP reviewed. Rx sent for  tramadol. She agrees to Ortho referral. - traMADol (ULTRAM) 50 MG tablet; Take 1 tablet (50 mg total) by mouth 2 (two) times daily as needed.  Dispense: 30 tablet; Refill: 0 - Ambulatory referral to Orthopedic Surgery  2. Lumbar degenerative disc disease Comments: Seen on lumbar spine CT. Encouraged to follow anti-inflammatory diet, incorporate ginger and turmeric into her diet, and to avoid processed foods. - Ambulatory referral to Orthopedic Surgery  3. 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 - Ambulatory referral to Orthopedic Surgery    COVID-19 Education: The signs and symptoms of COVID-19 were discussed with the patient and how to seek care for testing (follow up with PCP or arrange E-visit).  The importance of social distancing was discussed today.  Patient Risk:   After full review of this patients clinical status, I feel that they are at least moderate risk at this time.  Time:   Today, I have spent 20 minutes/ seconds with the patient with telehealth technology discussing above diagnoses.     Medication Adjustments/Labs and Tests Ordered: Current medicines are reviewed at length with the patient today.  Concerns regarding medicines are outlined above.   Tests Ordered: Orders Placed This Encounter  Procedures   Ambulatory referral to Orthopedic Surgery    Medication Changes: Meds ordered this encounter  Medications   traMADol (ULTRAM) 50 MG tablet    Sig: Take 1 tablet (50 mg total) by mouth 2 (two) times daily as needed.    Dispense:  30 tablet    Refill:  0    Disposition:  Follow up prn  Signed, [name], MD

## 2023-03-10 ENCOUNTER — Encounter: Payer: Self-pay | Admitting: Internal Medicine

## 2023-03-10 NOTE — Progress Notes (Incomplete)
Virtual Visit via Video   This visit type was conducted due to national recommendations for restrictions regarding the COVID-19 Pandemic (e.g. social distancing) in an effort to limit this patient's exposure and mitigate transmission in our community.  Due to her co-morbid illnesses, this patient is at least at moderate risk for complications without adequate follow up.  This format is felt to be most appropriate for this patient at this time.  All issues noted in this document were discussed and addressed.  A limited physical exam was performed with this format.    This visit type was conducted due to national recommendations for restrictions regarding the COVID-19 Pandemic (e.g. social distancing) in an effort to limit this patient's exposure and mitigate transmission in our community.  Patients identity confirmed using two different identifiers.  This format is felt to be most appropriate for this patient at this time.  All issues noted in this document were discussed and addressed.  No physical exam was performed (except for noted visual exam findings with Video Visits).    Date:  03/10/2023   ID:  [name], DOB 10/13/1951, MRN [id number]  Patient Location:  Home  Provider location:   Office    Chief Complaint:  "I have ER f/u"  History of Present Illness:    [name] is a 72 y.o. female who presents via video conferencing for a telehealth visit today.    The patient does not have symptoms concerning for COVID-19 infection (fever, chills, cough, or new shortness of breath).   Patient presents today for virtual visit. She presents for ED f/u. She presented to [facility] on 5/11 for further evaluation back pain.   Since her discharge, back pain is persistent. She was prescribed lidocaine patch, this has not helped her sx.   elp.      Past Medical History:  Diagnosis Date  . Aortic atherosclerosis (HCC) 04/16/2021  . Arthritis   . Cataract   . Diverticulosis    . H/O blood transfusion reaction 2010   hives  . H/O migraine    last 87yrs ago  . Headache(784.0)   . Heart murmur    takes Bystolic nightly  . History of blood clots 50yrs   was on Lovenox injections and Coumadin(was only on that for short period of time)  . History of shingles 3-49yrs ago  . Hx of seasonal allergies    takes Levocetirizine prn  . Hyperlipidemia    takes Crestor nightly  . Hypertension    takes Maxzide daily  . Insomnia    takes Ambien prn  . Joint pain   . Joint swelling   . Moderate tricuspid regurgitation   . Pre-diabetes   . Trigeminal neuralgia of right side of face 03/26/2016   V2 distribution   Past Surgical History:  Procedure Laterality Date  . ABDOMINAL HYSTERECTOMY    . BREAST BIOPSY  05/03/2006   [place]  . BREAST REDUCTION SURGERY  1987  . BUNIONECTOMY Right 05/11/2019  . CATARACT EXTRACTION Left 07/2019  . CHOLECYSTECTOMY    . COLONOSCOPY    . ESOPHAGOGASTRODUODENOSCOPY    . EYE SURGERY Right    eye lash removed  . JOINT REPLACEMENT     bilateral hip rt in 2007 and lt in 2010  . NM MYOCAR PERF WALL MOTION  11/04/2008   Normal  . REDUCTION MAMMAPLASTY Bilateral 1988  . REDUCTION MAMMAPLASTY    . SHOULDER ARTHROSCOPY WITH DISTAL CLAVICLE RESECTION Right 09/21/2015  Procedure: RIGHT SHOULDER ARTHROSCOPY WITH DISTAL CLAVICLE EXCISION DEBRIDE LABRAL TEAR ACROMIOPLASTY ;  Surgeon: Gean Birchwood, MD;  Location: [facility];  Service: Orthopedics;  Laterality: Right;  . TEE WITHOUT CARDIOVERSION N/A 04/12/2015   Procedure: TRANSESOPHAGEAL ECHOCARDIOGRAM (TEE)/BUBBLE STUDY;  Surgeon: Yates Decamp, MD;  Location: [facility] ENDOSCOPY;  Service: Cardiovascular;  Laterality: N/A;  . TOTAL HIP REVISION  04/14/2012   Procedure: TOTAL HIP REVISION;  Surgeon: [name], MD;  Location: [facility] OR;  Service: Orthopedics;  Laterality: Right;  right total hip revision  . TOTAL HIP REVISION Left 11/30/2013   Procedure: TOTAL HIP REVISION;  Surgeon: [name],  MD;  Location: [facility] OR;  Service: Orthopedics;  Laterality: Left;  . TOTAL KNEE ARTHROPLASTY Right 12/13/2020   Procedure: TOTAL KNEE ARTHROPLASTY;  Surgeon: Durene Romans, MD;  Location: [facility];  Service: Orthopedics;  Laterality: Right;  70 mins     Current Meds  Medication Sig  . acetaminophen (TYLENOL) 325 MG tablet Take 650 mg by mouth every 6 (six) hours as needed for headache.  . Cholecalciferol (DIALYVITE VITAMIN D 5000) 125 MCG (5000 UT) capsule Take 5,000 Units by mouth daily.  Marland Kitchen lidocaine (LIDODERM) 5 % Place 1 patch onto the skin daily. Remove & Discard patch within 12 hours or as directed by MD  . methocarbamol (ROBAXIN) 500 MG tablet Take 1 tablet (500 mg total) by mouth 2 (two) times daily as needed for muscle spasms.  . nebivolol (BYSTOLIC) 5 MG tablet TAKE 1 TABLET DAILY  . ondansetron (ZOFRAN-ODT) 4 MG disintegrating tablet Take 1 tablet (4 mg total) by mouth every 8 (eight) hours as needed for nausea or vomiting.  . pregabalin (LYRICA) 75 MG capsule Take 1 capsule (75 mg total) by mouth daily.  . rosuvastatin (CRESTOR) 10 MG tablet TAKE 1 TABLET DAILY  . triamterene-hydrochlorothiazide (MAXZIDE-25) 37.5-25 MG tablet TAKE 1 TABLET BY MOUTH DAILY  . zolpidem (AMBIEN) 5 MG tablet Take 1 tablet (5 mg total) by mouth at bedtime as needed. for sleep     Allergies:   Aspirin, Ibuprofen, Quinapril hcl, Penicillin g, Quinapril, Penicillins, and Pravastatin   Social History   Tobacco Use  . Smoking status: Former    Packs/day: 0.25    Years: 42.00    Additional pack years: 0.00    Total pack years: 10.50    Types: Cigarettes    Quit date: 10/22/2018    Years since quitting: 4.3  . Smokeless tobacco: Never  Vaping Use  . Vaping Use: Never used  Substance Use Topics  . Alcohol use: Not Currently    Comment: lst dirnk 2 years ago   . Drug use: No     Family Hx: The patient's family history includes Alzheimer's disease in her mother; Diabetes in her sister; Heart attack in  her mother; Hypertension in her sister.  ROS:   Please see the history of present illness.    Review of Systems  Constitutional: Negative.   HENT: Negative.    Respiratory: Negative.    Genitourinary: Negative.   Neurological: Negative.   Endo/Heme/Allergies: Negative.   Psychiatric/Behavioral: Negative.      All other systems reviewed and are negative.   Labs/Other Tests and Data Reviewed:    Recent Labs: 08/14/2022: ALT 8 11/22/2022: BUN 24; Creatinine, Ser 0.80; Hemoglobin 14.2; Platelets 183; Potassium 4.1; Sodium 144; TSH 1.200   Recent Lipid Panel Lab Results  Component Value Date/Time   CHOL 141 02/08/2022 11:09 AM   TRIG 73  02/08/2022 11:09 AM   HDL 63 02/08/2022 11:09 AM   CHOLHDL 2.2 02/08/2022 11:09 AM   CHOLHDL 2.1 04/15/2021 03:00 PM   LDLCALC 64 02/08/2022 11:09 AM    Wt Readings from Last 3 Encounters:  03/02/23 163 lb 2.3 oz (74 kg)  02/20/23 164 lb (74.4 kg)  02/20/23 164 lb 9.6 oz (74.7 kg)     Exam:    Vital Signs:  There were no vitals taken for this visit.    Physical Exam  ASSESSMENT & PLAN:     There are no diagnoses linked to this encounter.   COVID-19 Education: The signs and symptoms of COVID-19 were discussed with the patient and how to seek care for testing (follow up with PCP or arrange E-visit).  The importance of social distancing was discussed today.  Patient Risk:   After full review of this patients clinical status, I feel that they are at least moderate risk at this time.  Time:   Today, I have spent *** minutes/ seconds with the patient with telehealth technology discussing above diagnoses.     Medication Adjustments/Labs and Tests Ordered: Current medicines are reviewed at length with the patient today.  Concerns regarding medicines are outlined above.   Tests Ordered: Orders Placed This Encounter  Procedures  . Ambulatory referral to Orthopedic Surgery    Medication Changes: Meds ordered this encounter   Medications  . traMADol (ULTRAM) 50 MG tablet    Sig: Take 1 tablet (50 mg total) by mouth 2 (two) times daily as needed.    Dispense:  30 tablet    Refill:  0    Disposition:  Follow up {follow up:15908}  Signed, [name], MD

## 2023-03-14 ENCOUNTER — Telehealth: Payer: Self-pay

## 2023-03-14 NOTE — Telephone Encounter (Signed)
VMT pt requesting call back to discuss starting PREP at [facility].

## 2023-03-14 NOTE — Telephone Encounter (Signed)
Called to discuss PREP program; offered June/July schedule classes, but [place] [facility] [facility] is [place] away for [place] than she thought; would like to possibly attend at [facility], will ask Pam RN [facility] contact her with class schedule there.

## 2023-03-26 ENCOUNTER — Ambulatory Visit
Admission: RE | Admit: 2023-03-26 | Discharge: 2023-03-26 | Disposition: A | Discharge status: Home / Self Care | Payer: Medicare Other | Source: Ambulatory Visit | Attending: Internal Medicine | Admitting: Internal Medicine

## 2023-03-26 ENCOUNTER — Encounter: Payer: Self-pay | Admitting: Internal Medicine

## 2023-03-26 ENCOUNTER — Ambulatory Visit (INDEPENDENT_AMBULATORY_CARE_PROVIDER_SITE_OTHER): Payer: Medicare Other | Admitting: Internal Medicine

## 2023-03-26 VITALS — BP 118/78 | HR 56 | Temp 97.9°F | Ht 60.0 in | Wt 165.0 lb

## 2023-03-26 DIAGNOSIS — J0100 Acute maxillary sinusitis, unspecified: Secondary | ICD-10-CM | POA: Diagnosis not present

## 2023-03-26 DIAGNOSIS — Z1231 Encounter for screening mammogram for malignant neoplasm of breast: Secondary | ICD-10-CM

## 2023-03-26 DIAGNOSIS — Z6832 Body mass index (BMI) 32.0-32.9, adult: Secondary | ICD-10-CM

## 2023-03-26 DIAGNOSIS — E6609 Other obesity due to excess calories: Secondary | ICD-10-CM

## 2023-03-26 DIAGNOSIS — H6121 Impacted cerumen, right ear: Secondary | ICD-10-CM | POA: Diagnosis not present

## 2023-03-26 MED ORDER — DOXYCYCLINE HYCLATE 100 MG PO TABS: 100.0000 mg | ORAL_TABLET | Freq: Two times a day (BID) | ORAL | 0 refills | Status: DC

## 2023-03-26 NOTE — Patient Instructions (Signed)
Earache, Adult An earache, or ear pain, can be caused by many things, including: An infection. Ear wax buildup. Ear pressure. Something in the ear that should not be there (foreign body). A sore throat. Tooth problems. Jaw problems. Treatment of the earache will depend on the cause. If the cause is not clear or cannot be known, you may need to watch your symptoms until your earache goes away or until a cause is found. Follow these instructions at home: Medicines Take or apply over-the-counter and prescription medicines only as told by your health care provider. If you were prescribed antibiotics, use them as told by your health care provider. Do not stop using the antibiotic even if you start to feel better. Do not put anything in your ear other than medicine that is prescribed by your health care provider. Managing pain     If directed, apply heat to the affected area as often as told by your health care provider. Use the heat source that your health care provider recommends, such as a moist heat pack or a heating pad. Place a towel between your skin and the heat source. Leave the heat on for 20-30 minutes. If your skin turns bright red, remove the heat right away to prevent burns. The risk of burns is higher if you cannot feel pain, heat, or cold. If directed, put ice on the affected area. To do this: Put ice in a plastic bag. Place a towel between your skin and the bag. Leave the ice on for 20 minutes, 2-3 times a day. If your skin turns bright red, remove the ice right away to prevent skin damage. The risk of skin damage is higher if you cannot feel pain, heat, or cold.  General instructions Pay attention to any changes in your symptoms. Try resting in an upright position instead of lying down. This may help to reduce pressure in your ear and relieve pain. Chew gum if it helps to relieve your ear pain. Treat any allergies as told by your health care provider. Drink enough fluid  to keep your urine pale yellow. It is up to you to get the results of any tests that were done. Ask your health care provider, or the department that is doing the tests, when your results will be ready. Contact a health care provider if: Your pain does not improve within 2 days. Your earache gets worse. You have new symptoms. You have a fever. Get help right away if: You have a severe headache. You have a stiff neck. You have trouble swallowing. You have redness or swelling behind your ear. You have fluid or blood coming from your ear. You have hearing loss. You feel dizzy. This information is not intended to replace advice given to you by your health care provider. Make sure you discuss any questions you have with your health care provider. Document Revised: 02/19/2022 Document Reviewed: 02/19/2022 Elsevier Patient Education  2024 [employer].

## 2023-03-26 NOTE — Progress Notes (Signed)
I,[name],acting as a scribe for Veronica Aliment, MD.,have documented all relevant documentation on the behalf of Veronica Aliment, MD,as directed by  Veronica Aliment, MD while in the presence of Veronica Aliment, MD.    Subjective:     Patient ID: [name] , female    DOB: [birth year] , 72 y.o.   MRN: [id number]   Chief Complaint  Patient presents with   Otalgia   Headache    HPI  Patient presents today for further evaluation of head & earache. She reports it is more of a discomfort than a pain. She reports this initially started one week ago. Now it has moved to the right side of her jaw. There is associated sinus congestion and facial pain. Denies known ill contacts. No fever/chills. She has felt achy.   At home she has tried Tylenol without relief of her symptoms. . She admits this has not helped her sx.      Past Medical History:  Diagnosis Date   Aortic atherosclerosis (HCC) 04/16/2021   Arthritis    Cataract    Diverticulosis    H/O blood transfusion reaction 2010   hives   H/O migraine    last 67yrs ago   Headache(784.0)    Heart murmur    takes Bystolic nightly   History of blood clots 36yrs   was on Lovenox injections and Coumadin(was only on that for short period of time)   History of shingles 3-63yrs ago   Hx of seasonal allergies    takes Levocetirizine prn   Hyperlipidemia    takes Crestor nightly   Hypertension    takes Maxzide daily   Insomnia    takes Ambien prn   Joint pain    Joint swelling    Moderate tricuspid regurgitation    Pre-diabetes    Trigeminal neuralgia of right side of face 03/26/2016   V2 distribution     Family History  Problem Relation Age of Onset   Heart attack Mother    Alzheimer's disease Mother    Diabetes Sister    Hypertension Sister      Current Outpatient Medications:    acetaminophen (TYLENOL) 325 MG tablet, Take 650 mg by mouth every 6 (six) hours as needed for headache., Disp: , Rfl:     Cholecalciferol (DIALYVITE VITAMIN D 5000) 125 MCG (5000 UT) capsule, Take 5,000 Units by mouth daily., Disp: , Rfl:    doxycycline (VIBRA-TABS) 100 MG tablet, Take 1 tablet (100 mg total) by mouth 2 (two) times daily., Disp: 20 tablet, Rfl: 0   lidocaine (LIDODERM) 5 %, Place 1 patch onto the skin daily. Remove & Discard patch within 12 hours or as directed by MD, Disp: 30 patch, Rfl: 0   methocarbamol (ROBAXIN) 500 MG tablet, Take 1 tablet (500 mg total) by mouth 2 (two) times daily as needed for muscle spasms., Disp: 20 tablet, Rfl: 0   nebivolol (BYSTOLIC) 5 MG tablet, TAKE 1 TABLET DAILY, Disp: 90 tablet, Rfl: 3   ondansetron (ZOFRAN-ODT) 4 MG disintegrating tablet, Take 1 tablet (4 mg total) by mouth every 8 (eight) hours as needed for nausea or vomiting., Disp: 20 tablet, Rfl: 0   pregabalin (LYRICA) 75 MG capsule, Take 1 capsule (75 mg total) by mouth daily., Disp: 90 capsule, Rfl: 0   rosuvastatin (CRESTOR) 10 MG tablet, TAKE 1 TABLET DAILY, Disp: 90 tablet, Rfl: 3   traMADol (ULTRAM) 50 MG tablet, Take 1 tablet (50 mg total)  by mouth 2 (two) times daily as needed., Disp: 30 tablet, Rfl: 0   triamterene-hydrochlorothiazide (MAXZIDE-25) 37.5-25 MG tablet, TAKE 1 TABLET BY MOUTH DAILY, Disp: 90 tablet, Rfl: 1   zolpidem (AMBIEN) 5 MG tablet, Take 1 tablet (5 mg total) by mouth at bedtime as needed. for sleep, Disp: 60 tablet, Rfl: 2   ofloxacin (OCUFLOX) 0.3 % ophthalmic solution, INSTILL 1 DROP IN RIGHT EYE FOUR TIMES DAILY. START 1 DAY BEFORE SURGERY (Patient not taking: Reported on 11/22/2022), Disp: , Rfl:    prednisoLONE acetate (PRED FORTE) 1 % ophthalmic suspension, SHAKE LIQUID AND INSTILL 1 DROP IN RIGHT EYE FOUR TIMES DAILY AFTER SURGERY (Patient not taking: Reported on 11/22/2022), Disp: , Rfl:    SYSTANE 0.4-0.3 % GEL ophthalmic gel, SMARTSIG:1 Drop(s) In Eye(s) PRN (Patient not taking: Reported on 11/22/2022), Disp: , Rfl:    triamcinolone ointment (KENALOG) 0.1 %, APPLY TOPICALLY TO THE  AFFECTED AREA TWICE DAILY (Patient not taking: Reported on 08/14/2022), Disp: , Rfl:    Allergies  Allergen Reactions   Aspirin Anaphylaxis and Other (See Comments)   Ibuprofen Hives, Swelling and Other (See Comments)   Quinapril Hcl Anaphylaxis   Penicillin G Other (See Comments)   Quinapril Other (See Comments)   Penicillins Hives, Swelling and Other (See Comments)    Has patient had a PCN reaction causing immediate rash, facial/tongue/throat swelling, SOB or lightheadedness with hypotension: Yes  Has patient had a PCN reaction causing severe rash involving mucus membranes or skin necrosis: No  Has patient had a PCN reaction that required hospitalization: No  Has patient had a PCN reaction occurring within the last 10 years: No  If all of the above answers are "NO", then may proceed with Cephalosporin use.  Tolerated Cephalosporin Date: 12/13/20.   Pravastatin Rash     Review of Systems  Constitutional: Negative.   HENT:  Positive for congestion and sinus pressure.   Respiratory: Negative.    Cardiovascular: Negative.   Gastrointestinal: Negative.   Musculoskeletal: Negative.   Neurological: Negative.   Psychiatric/Behavioral: Negative.       Today's Vitals   03/26/23 1451  BP: 118/78  Pulse: (!) 56  Temp: 97.9 F (36.6 C)  SpO2: 98%  Weight: 165 lb (74.8 kg)  Height: 5' (1.524 m)   Wt Readings from Last 3 Encounters:  03/28/23 165 lb (74.8 kg)  03/26/23 165 lb (74.8 kg)  03/02/23 163 lb 2.3 oz (74 kg)    Body mass index is 32.22 kg/m.  The 10-year ASCVD risk score (Arnett DK, et al., 2019) is: 13.7%   Values used to calculate the score:     Age: 73 years     Sex: Female     Is Non-Hispanic African American: Yes     Diabetic: No     Tobacco smoker: Yes     Systolic Blood Pressure: 100 mmHg     Is BP treated: Yes     HDL Cholesterol: 63 mg/dL     Total Cholesterol: 141 mg/dL ++ Objective:  Physical Exam Vitals and nursing note reviewed.   Constitutional:      Appearance: Normal appearance.  HENT:     Head: Normocephalic and atraumatic.     Comments: Maxillary sinus tender to palpation    Right Ear: Hearing, ear canal and external ear normal. There is impacted cerumen.     Left Ear: Hearing, tympanic membrane, ear canal and external ear normal.  Eyes:     Pupils: Pupils are equal,  round, and reactive to light.  Cardiovascular:     Rate and Rhythm: Normal rate and regular rhythm.     Heart sounds: Normal heart sounds.  Pulmonary:     Effort: Pulmonary effort is normal.     Breath sounds: Normal breath sounds.  Skin:    General: Skin is warm.  Neurological:     General: No focal deficit present.     Mental Status: She is alert.  Psychiatric:        Mood and Affect: Mood normal.        Behavior: Behavior normal.         Assessment And Plan:     1. Acute non-recurrent maxillary sinusitis Comments: I will send rx doxycycline, encouraged to take full course of abx. Advised to avoid dairy products as well.  2. Right ear impacted cerumen Comments: I will defer ear lavage until a later date due to concurrent infection.  3. Class 1 obesity due to excess calories with serious comorbidity and body mass index (BMI) of 32.0 to 32.9 in adult She is encouraged to strive for BMI less than 30 to decrease cardiac risk. Advised to aim for at least 150 minutes of exercise per week.    Return if symptoms worsen or fail to improve.  Patient was given opportunity to ask questions. Patient verbalized understanding of the plan and was able to repeat key elements of the plan. All questions were answered to their satisfaction.   I, Veronica Aliment, MD, have reviewed all documentation for this visit. The documentation on 03/26/23 for the exam, diagnosis, procedures, and orders are all accurate and complete.   IF YOU HAVE BEEN REFERRED TO A SPECIALIST, IT MAY TAKE 1-2 WEEKS TO SCHEDULE/PROCESS THE REFERRAL. IF YOU HAVE NOT HEARD FROM  US/SPECIALIST IN TWO WEEKS, PLEASE GIVE [place] A CALL AT [phone] X [phone].   THE PATIENT IS ENCOURAGED TO PRACTICE SOCIAL DISTANCING DUE TO THE COVID-19 PANDEMIC.

## 2023-03-28 ENCOUNTER — Other Ambulatory Visit (INDEPENDENT_AMBULATORY_CARE_PROVIDER_SITE_OTHER): Payer: Medicare Other

## 2023-03-28 ENCOUNTER — Ambulatory Visit (INDEPENDENT_AMBULATORY_CARE_PROVIDER_SITE_OTHER): Payer: Medicare Other | Admitting: Orthopedic Surgery

## 2023-03-28 ENCOUNTER — Encounter: Payer: Self-pay | Admitting: Orthopedic Surgery

## 2023-03-28 VITALS — BP 104/68 | HR 59 | Ht 60.0 in | Wt 165.0 lb

## 2023-03-28 DIAGNOSIS — G8929 Other chronic pain: Secondary | ICD-10-CM | POA: Diagnosis not present

## 2023-03-28 DIAGNOSIS — M533 Sacrococcygeal disorders, not elsewhere classified: Secondary | ICD-10-CM

## 2023-03-28 DIAGNOSIS — M545 Low back pain, unspecified: Secondary | ICD-10-CM

## 2023-03-28 MED ORDER — METHYLPREDNISOLONE 4 MG PO TBPK: ORAL_TABLET | ORAL | 0 refills | Status: DC

## 2023-03-28 NOTE — Progress Notes (Signed)
Orthopedic Spine Surgery Office Note  Assessment: Patient is a 72 y.o. female with acute right-sided posterior hip pain with positive SI joint physical exam findings   Plan: -Explained that initially conservative treatment is tried as a significant number of patients may experience relief with these treatment modalities. Discussed that the conservative treatments include:  -activity modification  -physical therapy  -over the counter pain medications  -medrol dosepak  -lumbar steroid injections -Patient has tried Tylenol, lyrica, lidocaine patch, muscle relaxants, PT  -Recommended she continue with the Tylenol, Lyrica, muscle relaxer, and PT. also recommended she try a diagnostic/therapeutic injection to the right SI joint with Dr. Shon Baton -If she is not doing any better at her next visit, will recommend MRI of the lumbar spine to evaluate for radiculopathy -Patient should return to office in 6 weeks, x-rays at next visit: None   Patient expressed understanding of the plan and all questions were answered to the patient's satisfaction.   ___________________________________________________________________________   History:  Patient is a 72 y.o. female who presents today for lumbar spine.  Patient has had 1 month of right-sided low back pain.  She feels it in the area of the posterior hip.  There is no trauma or injury that preceded the onset of pain.  She says she woke up 1 day and noticed the pain.  She describes it as a heaviness.  She points to the area of the SI joint as the area of pain.  She does not have any pain in the left side.  She has no pain radiating past the buttock on either side.  She has not had pain like this before.   Weakness: denies Symptoms of imbalance: denies Paresthesias and numbness: denies Bowel or bladder incontinence: denies Saddle anesthesia: denies  Treatments tried: Tylenol, lyrica, lidocaine patch, muscle relaxants, PT  Review of systems: Denies  fevers and chills, night sweats, unexplained weight loss, history of cancer.  Has had pain that wakes her at night  Past medical history: Atherosclerosis Diverticulosis Migraines History of blood clots HLD HTN Insomnia Trigeminal neuralgia  Allergies: aspirin, ibuprofen, quinapril, penicillin, pravastatin  Past surgical history:  Hysterectomy Breast reduction Cataract surgery Bunionectomy Cholecystectomy Bilateral THA R TKA Revision THA surgery Right shoulder arthroscopy   Social history: Denies use of nicotine product (smoking, vaping, patches, smokeless) Alcohol use: Denies Denies recreational drug use   Physical Exam:  BMI of 32.2  General: no acute distress, appears stated age Neurologic: alert, answering questions appropriately, following commands Respiratory: unlabored breathing on room air, symmetric chest rise Psychiatric: appropriate affect, normal cadence to speech   MSK (spine):  -Strength exam      Left  Right EHL    -/5  5/5 TA    5/5  5/5 GSC    5/5  5/5 Knee extension  5/5  5/5 Hip flexion   5/5  5/5  Prior bunionectomy on the left   -Sensory exam    Sensation intact to light touch in L3-S1 nerve distributions of bilateral lower extremities  -Achilles DTR: 1/4 on the left, 1/4 on the right -Patellar tendon DTR: 1/4 on the left, 1/4 on the right  -Straight leg raise: negative bilaterally -Femoral nerve stretch test: negative bilaterally -Clonus: no beats bilaterally  -Left hip exam: No pain through range of motion, negative Stinchfield, negative FABER -Right hip exam: Pain with maximum internal rotation, positive FABER, positive Fortin finger test, positive SI joint compression test, negative Stinchfield  Imaging: XR of the lumbar spine from 03/28/2023  was independently reviewed and interpreted, showing 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  Disc height loss at L2/3, L3/4, L4/5.  Anterior osteophyte formation noted  at L2/3 and L3/4.  Spondylolisthesis seen at L4/5. No fracture or dislocation seen.   CT of the lumbar spine from 03/02/2023 was independently reviewed and interpreted, showing multiple levels with disc height loss and facet arthropathy.  The facet arthropathy is worse in the lower lumbar levels.  Anterior osteophyte formation at L1/2, L2/3, and L3/4.  Vacuum disc phenomenon at L4/5 and L5/S1.  No fracture or dislocation seen.    Patient name: [name] Patient MRN: [id number] Date of visit: 03/28/23

## 2023-04-05 ENCOUNTER — Encounter: Payer: Self-pay | Admitting: Sports Medicine

## 2023-04-05 ENCOUNTER — Ambulatory Visit (INDEPENDENT_AMBULATORY_CARE_PROVIDER_SITE_OTHER): Payer: Medicare Other | Admitting: Sports Medicine

## 2023-04-05 ENCOUNTER — Other Ambulatory Visit: Payer: Self-pay

## 2023-04-05 ENCOUNTER — Telehealth: Payer: Self-pay

## 2023-04-05 DIAGNOSIS — M533 Sacrococcygeal disorders, not elsewhere classified: Secondary | ICD-10-CM | POA: Diagnosis not present

## 2023-04-05 DIAGNOSIS — G8929 Other chronic pain: Secondary | ICD-10-CM

## 2023-04-05 NOTE — Telephone Encounter (Signed)
VMT pt requesting call back to schedule intake for PREP starting on 04/16/23 

## 2023-04-05 NOTE — Progress Notes (Signed)
   Office & Procedure Note  Patient: [name]             Date of Birth: 02/07/1951           MRN: [id number]             Visit Date: 04/05/2023  HPI: [name] a month with right-sided posterior hip pain.  Previously seen by my partner Dr. Christell Constant, but this could be coming from the [facility] joint pathology.  To discuss possible injection.  [name] also states that he [name] has been having some spasming in that area.  [name] does take methocarbamol as needed.  Also using lidocaine patch, has questions about continued use.  PE: + TTP over R-SI joint region. + Fortin's point test. No gross restriction with flexion/extension of the lumbar spine. Ambulates with the use of a rolling walker.   Visit Diagnoses:  1. Chronic right SI joint pain    Procedures:  U/S-guided SI-joint injection, Right   After discussion of risk/benefits/indications, informed verbal consent was obtained. A timeout was then performed. The patient was positioned in a prone position on exam room table with a pillow placed under the pelvis for mild hip flexion. The SI joint area was cleaned and prepped with betadine and alcohol swabs. Sterile ultrasound gel was applied and the ultrasound transducer was placed in an anatomic axial plane over the PSIS, then moved distally over the SI-joint. Using ultrasound guidance, a 22-gauge, 3.5" needle was inserted from a medial to lateral approach utilizing an in-plane approach and directed into the SI-joint. The SI-joint was then injected with a mixture of 4:2 lidocaine:depomedrol with visualization of the injectate flow into the SI-joint under ultrasound visualization. The patient tolerated the procedure well without immediate complications.  Plan:  -Through shared decision making, elected to proceed with ultrasound-guided right SI joint injection.  Patient tolerated well.  I evaluated the patient about 5 minutes post-injection and [name] had excellent improvement in pain -Discussed with her [name] may use  heat, Tylenol, lidocaine patch for any postinjection pain -[name] can continue her methocarbamol 500 mg as needed for muscle spasm.  I did discuss with her that hopefully after a few days of the injection medicine kicking and that these resolve as well - follow-up with Dr. Christell Constant as indicated; I am happy to see them as needed  Madelyn Brunner, DO Primary Care Sports Medicine Physician  [facility] - Orthopedics  This note was dictated using Dragon naturally speaking software and may contain errors in syntax, spelling, or content which have not been identified prior to signing this note.

## 2023-04-05 NOTE — Telephone Encounter (Signed)
Pt returned call.  Call to pt, confirmed can start PREP on 04/16/23 T/TH 230-345p at [facility].  Intake scheduled for 04/10/23 at 4pm, will meet pt in lobby of [facility]

## 2023-04-10 NOTE — Progress Notes (Signed)
[facility] PREP Evaluation  Patient Details  Name: [name] MRN: [id number] Date of Birth: 24-Jan-1951 Age: 73 y.o. PCP: Dorothyann Peng, MD  Vitals:   04/10/23 1642  BP: 100/60  Pulse: 60  SpO2: 97%     [facility] Eval - 04/10/23 1600       [facility] "PREP" Location   [facility] "PREP" Location [facility]      Referral    Referring Provider Sanders    Reason for referral Inactivity;Hypertension    Program Start Date 04/16/23   T/Th 230p-345p x 12 wks     Measurement   Waist Circumference 39 inches    Hip Circumference 41 inches    Body fat 38.3 percent      Information for Trainer   Goals Feel better, get back to exercise, have less pain, move around easier, lower A1C, lose inches off waist    Current Exercise only occ    Orthopedic Concerns Hips, back, knees, arthritis    Pertinent Medical History HTN, CAD, CHF, prediabetes per patient A1C 6.6    Current Barriers none, vacation around July 4th    Restrictions/Precautions --   have lunch before coming to exercise   Medications that affect exercise Medication causing dizziness/drowsiness      Timed Up and Go (TUGS)   Timed Up and Go Moderate risk 10-12 seconds   due to pain     Mobility and Daily Activities   I find it easy to walk up or down two or more flights of stairs. 4    I have no trouble taking out the trash. 4    I do housework such as vacuuming and dusting on my own without difficulty. 4    I can easily lift a gallon of milk (8lbs). 4    I can easily walk a mile. 2    I have no trouble reaching into high cupboards or reaching down to pick up something from the floor. 4    I do not have trouble doing out-door work such as Loss adjuster, chartered, raking leaves, or gardening. 1      Mobility and Daily Activities   I feel younger than my age. 4    I feel independent. 4    I feel energetic. 2    I live an active life.  1    I feel strong. 2    I feel healthy. 4    I feel active as other people my age. 4      How fit and  strong are you.   Fit and Strong Total Score 44            Past Medical History:  Diagnosis Date   Aortic atherosclerosis (HCC) 04/16/2021   Arthritis    Cataract    Diverticulosis    H/O blood transfusion reaction 2010   hives   H/O migraine    last 58yrs ago   Headache(784.0)    Heart murmur    takes Bystolic nightly   History of blood clots 61yrs   was on Lovenox injections and Coumadin(was only on that for short period of time)   History of shingles 3-21yrs ago   Hx of seasonal allergies    takes Levocetirizine prn   Hyperlipidemia    takes Crestor nightly   Hypertension    takes Maxzide daily   Insomnia    takes Ambien prn   Joint pain    Joint swelling    Moderate tricuspid  regurgitation    Pre-diabetes    Trigeminal neuralgia of right side of face 03/26/2016   V2 distribution   Past Surgical History:  Procedure Laterality Date   ABDOMINAL HYSTERECTOMY     BREAST BIOPSY  05/03/2006   US   BREAST REDUCTION SURGERY  1987   BUNIONECTOMY Right 05/11/2019   CATARACT EXTRACTION Left 07/2019   CHOLECYSTECTOMY     COLONOSCOPY     ESOPHAGOGASTRODUODENOSCOPY     EYE SURGERY Right    eye lash removed   JOINT REPLACEMENT     bilateral hip rt in 2007 and lt in 2010   NM MYOCAR PERF WALL MOTION  11/04/2008   Normal   REDUCTION MAMMAPLASTY Bilateral 1988   REDUCTION MAMMAPLASTY     SHOULDER ARTHROSCOPY WITH DISTAL CLAVICLE RESECTION Right 09/21/2015   Procedure: RIGHT SHOULDER ARTHROSCOPY WITH DISTAL CLAVICLE EXCISION DEBRIDE LABRAL TEAR ACROMIOPLASTY ;  Surgeon: Gean Birchwood, MD;  Location:  SURGERY CENTER;  Service: Orthopedics;  Laterality: Right;   TEE WITHOUT CARDIOVERSION N/A 04/12/2015   Procedure: TRANSESOPHAGEAL ECHOCARDIOGRAM (TEE)/BUBBLE STUDY;  Surgeon: Yates Decamp, MD;  Location: [facility] ENDOSCOPY;  Service: Cardiovascular;  Laterality: N/A;   TOTAL HIP REVISION  04/14/2012   Procedure: TOTAL HIP REVISION;  Surgeon: [name], MD;  Location: [facility] OR;   Service: Orthopedics;  Laterality: Right;  right total hip revision   TOTAL HIP REVISION Left 11/30/2013   Procedure: TOTAL HIP REVISION;  Surgeon: [name], MD;  Location: [facility] OR;  Service: Orthopedics;  Laterality: Left;   TOTAL KNEE ARTHROPLASTY Right 12/13/2020   Procedure: TOTAL KNEE ARTHROPLASTY;  Surgeon: Durene Romans, MD;  Location: [facility] ORS;  Service: Orthopedics;  Laterality: Right;  70 mins   Social History   Tobacco Use  Smoking Status Former   Packs/day: 0.25   Years: 42.00   Additional pack years: 0.00   Total pack years: 10.50   Types: Cigarettes   Quit date: 10/22/2018   Years since quitting: 4.4  Smokeless Tobacco Never    Bonnye Fava 04/10/2023, 4:48 PM

## 2023-04-17 NOTE — Progress Notes (Signed)
[facility] PREP Weekly Session  Patient Details  Name: [name] MRN: [id number] Date of Birth: 10-Jul-1951 Age: 72 y.o. PCP: Dorothyann Peng, MD  There were no vitals filed for this visit.   [facility] Weekly seesion - 04/17/23 1500       [facility] "PREP" Location   [facility] "PREP" Location [facility]      Weekly Session   Topic Discussed Goal setting and welcome to the program    Classes attended to date 1             Bonnye Fava 04/17/2023, 3:41 PM

## 2023-04-19 ENCOUNTER — Encounter: Payer: Self-pay | Admitting: Internal Medicine

## 2023-04-19 ENCOUNTER — Other Ambulatory Visit: Payer: Self-pay | Admitting: Internal Medicine

## 2023-04-19 MED ORDER — METHOCARBAMOL 500 MG PO TABS: 500.0000 mg | ORAL_TABLET | Freq: Two times a day (BID) | ORAL | 0 refills | Status: DC | PRN

## 2023-05-02 NOTE — Progress Notes (Signed)
[facility] PREP Weekly Session  Patient Details  Name: [name] MRN: [id number] Date of Birth: 1951/06/08 Age: 72 y.o. PCP: Dorothyann Peng, MD  Vitals:   04/30/23 1430  Weight: 168 lb (76.2 kg)     [facility] Weekly seesion - 05/02/23 0900       [facility] "PREP" Location   [facility]      Weekly Session   Topic Discussed Healthy eating tips    Minutes exercised this week 15 minutes    Classes attended to date 3             [name] 05/02/2023, 9:13 AM

## 2023-05-08 ENCOUNTER — Other Ambulatory Visit: Payer: Self-pay | Admitting: Internal Medicine

## 2023-05-09 ENCOUNTER — Ambulatory Visit: Payer: Medicare Other | Admitting: Orthopedic Surgery

## 2023-05-09 NOTE — Progress Notes (Signed)
[facility] PREP Weekly Session  Patient Details  Name: [name] MRN: [id number] Date of Birth: Feb 28, 1951 Age: 72 y.o. PCP: Dorothyann Peng, MD  Vitals:   05/07/23 1430  Weight: 169 lb (76.7 kg)     [facility] Weekly seesion - 05/09/23 0900       [facility] "PREP" Location   [facility]      Weekly Session   Topic Discussed Health habits    Minutes exercised this week 47 minutes    Classes attended to date 5             [name] 05/09/2023, 9:25 AM

## 2023-05-16 NOTE — Progress Notes (Signed)
[facility] PREP Weekly Session  Patient Details  Name: [name] MRN: [id number] Date of Birth: [birth year] Age: 72 y.o. PCP: Dorothyann Peng, MD  Vitals:   05/14/23 1430  Weight: 169 lb (76.7 kg)     [facility] Weekly seesion - 05/16/23 1600       [facility] "PREP" Location   [facility] "PREP" Location [facility]      Weekly Session   Topic Discussed Restaurant Eating    Minutes exercised this week 135 minutes    Classes attended to date 8             Bonnye Fava 05/16/2023, 4:39 PM

## 2023-05-23 NOTE — Progress Notes (Signed)
[facility] PREP Weekly Session  Patient Details  Name: [name] MRN: [id number] Date of Birth: 10/26/1950 Age: 72 y.o. PCP: Dorothyann Peng, MD  Vitals:   05/21/23 1430  Weight: 168 lb (76.2 kg)     [facility] Weekly seesion - 05/23/23 1200       [facility] "PREP" Location   [facility]      Weekly Session   Topic Discussed Stress management and problem solving   progressive relaxation meditation   Minutes exercised this week 240 minutes    Classes attended to date 7             [name] 05/23/2023, 12:30 PM

## 2023-05-28 ENCOUNTER — Other Ambulatory Visit: Payer: Self-pay | Admitting: Internal Medicine

## 2023-05-29 ENCOUNTER — Other Ambulatory Visit: Payer: Self-pay | Admitting: Internal Medicine

## 2023-05-29 MED ORDER — PREGABALIN 75 MG PO CAPS: 75.0000 mg | ORAL_CAPSULE | Freq: Every day | ORAL | 2 refills | Status: DC

## 2023-05-29 NOTE — Progress Notes (Signed)
[facility] PREP Weekly Session  Patient Details  Name: [name] MRN: [id number] Date of Birth: 1951-01-25 Age: 72 y.o. PCP: Dorothyann Peng, MD  Vitals:   05/28/23 1430  Weight: 165 lb (74.8 kg)     [facility] Weekly seesion - 05/29/23 0900       [facility] "PREP" Location   [facility]      Weekly Session   Topic Discussed Finding support    Minutes exercised this week 250 minutes    Classes attended to date 71             [name] 05/29/2023, 9:49 AM

## 2023-06-04 NOTE — Progress Notes (Signed)
[facility] PREP Weekly Session  Patient Details  Name: HAMPTON SCHEFFLER MRN: [id number] Date of Birth: October 11, 1951 Age: 72 y.o. PCP: Dorothyann Peng, MD  Vitals:   06/04/23 1430  Weight: 167 lb (75.8 kg)     [facility] Weekly seesion - 06/04/23 1700       [facility] "PREP" Location   [facility]      Weekly Session   Topic Discussed --   portions   Minutes exercised this week 230 minutes    Classes attended to date 13             Bonnye Fava 06/04/2023, 5:17 PM

## 2023-06-11 NOTE — Progress Notes (Signed)
[facility] PRE[facility] Weekly Session  Patient Details  Name: [name] MRN: [id number] Date of Birth: [birth year] Age: 72 y.o. PCP: Dorothyann Peng, MD  Vitals:   06/11/23 1609  Weight: 168 lb (76.2 kg)     [facility] Weekly seesion - 06/11/23 1600       [facility] "PREP" Location   [facility] "PREP" Location [facility]      Weekly Session   Topic Discussed Finding support    Minutes exercised this week 255 minutes    Classes attended to date 41             [name] 06/11/2023, 4:10 PM

## 2023-06-18 NOTE — Progress Notes (Signed)
[facility] PRE[facility] Weekly Session  Patient Details  Name: [name] MRN: [id number] Date of Birth: [birth year] Age: 72 y.o. PCP: Dorothyann Peng, MD  Vitals:   06/18/23 1430  Weight: 174 lb (78.9 kg)     [facility] Weekly seesion - 06/18/23 1600       [facility]PREP" Location   [facility]PREP[facility] Location [facility]      Weekly Session   Topic Discussed Hitting roadblocks    Classes attended to date 17             Bonnye Fava 06/18/2023, 4:56 PM

## 2023-06-25 NOTE — Progress Notes (Signed)
[facility] PRE[facility] Weekly Session  Patient Details  Name: [name] MRN: [id number] Date of Birth: [birth year] Age: 72 y.o. PCP: Dorothyann Peng, MD  Vitals:   06/25/23 1430  Weight: 168 lb (76.2 kg)     [facility] Weekly seesion - 06/25/23 1600       [facility] "PREP" Location   [facility]PREP[facility] Location [facility]      Weekly Session   Topic Discussed --   goal writing   Minutes exercised this week 220 minutes    Classes attended to date 7             [name] 06/25/2023, 4:56 PM

## 2023-07-02 NOTE — Progress Notes (Signed)
[facility] PRE[facility] Weekly Session  Patient Details  Name: [name] MRN: [id number] Date of Birth: Jul 07, 1951 Age: 72 y.o. PCP: Dorothyann Peng, MD  Vitals:   07/02/23 1616  Weight: 167 lb (75.8 kg)     [facility] Weekly seesion - 07/02/23 1600       [facility] "PREP" Location   [facility]      Weekly Session   Topic Discussed --   fit testing, surveys   Minutes exercised this week 260 minutes    Classes attended to date 21             Bonnye Fava 07/02/2023, 4:17 PM

## 2023-07-04 NOTE — Progress Notes (Signed)
[facility] PREP Evaluation  Patient Details  Name: [name] MRN: [id number] Date of Birth: 1951-05-12 Age: 72 y.o. PCP: Dorothyann Peng, MD  Vitals:   07/04/23 1500  BP: 102/64  Pulse: 68  Weight: 168 lb (76.2 kg)     [facility] Eval - 07/04/23 1600       [facility] "PREP" Location   [facility]PREP[facility] Location [facility]      Referral    Referring Provider Allyne Gee    Program Start Date 04/16/23    Program End Date 07/04/23      Measurement   Waist Circumference 39 inches    Waist Circumference End Program 38.5 inches    Hip Circumference 41 inches    Hip Circumference End Program 43 inches    Body fat 38.7 percent      Information for Trainer   Goals Plans to continue to exercise      Mobility and Daily Activities   I find it easy to walk up or down two or more flights of stairs. 4    I have no trouble taking out the trash. 4    I do housework such as vacuuming and dusting on my own without difficulty. 4    I can easily lift a gallon of milk (8lbs). 4    I can easily walk a mile. 3    I have no trouble reaching into high cupboards or reaching down to pick up something from the floor. 2    I do not have trouble doing out-door work such as Loss adjuster, chartered, raking leaves, or gardening. 2      Mobility and Daily Activities   I feel younger than my age. 4    I feel independent. 4    I feel energetic. 3    I live an active life.  2    I feel strong. 3    I feel healthy. 3    I feel active as other people my age. 4      How fit and strong are you.   Fit and Strong Total Score 46            Past Medical History:  Diagnosis Date   Aortic atherosclerosis (HCC) 04/16/2021   Arthritis    Cataract    Diverticulosis    H/O blood transfusion reaction 2010   hives   H/O migraine    last 61yrs ago   Headache(784.0)    Heart murmur    takes Bystolic nightly   History of blood clots 72yrs   was on Lovenox injections and Coumadin(was only on that for short period of time)    History of shingles 3-22yrs ago   Hx of seasonal allergies    takes Levocetirizine prn   Hyperlipidemia    takes Crestor nightly   Hypertension    takes Maxzide daily   Insomnia    takes Ambien prn   Joint pain    Joint swelling    Moderate tricuspid regurgitation    Pre-diabetes    Trigeminal neuralgia of right side of face 03/26/2016   V2 distribution   Past Surgical History:  Procedure Laterality Date   ABDOMINAL HYSTERECTOMY     BREAST BIOPSY  05/03/2006   US   BREAST REDUCTION SURGERY  1987   BUNIONECTOMY Right 05/11/2019   CATARACT EXTRACTION Left 07/2019   CHOLECYSTECTOMY     COLONOSCOPY     ESOPHAGOGASTRODUODENOSCOPY     EYE SURGERY Right  eye lash removed   JOINT REPLACEMENT     bilateral hip rt in 2007 and lt in 2010   NM MYOCAR PERF WALL MOTION  11/04/2008   Normal   REDUCTION MAMMAPLASTY Bilateral 1988   REDUCTION MAMMAPLASTY     SHOULDER ARTHROSCOPY WITH DISTAL CLAVICLE RESECTION Right 09/21/2015   Procedure: RIGHT SHOULDER ARTHROSCOPY WITH DISTAL CLAVICLE EXCISION DEBRIDE LABRAL TEAR ACROMIOPLASTY ;  Surgeon: Gean Birchwood, MD;  Location:  S[facility];  Service: Orthopedics;  Laterality: Right;   TEE WITHOUT CARDIOVERSION N/A 04/12/2015   Procedure: TRANSESOPHAGEAL ECHOCARDIOGRAM (TEE)/BUBBLE STUDY;  Surgeon: Yates Decamp, MD;  Location: [facility] ENDOSCOPY;  Service: Cardiovascular;  Laterality: N/A;   TOTAL HIP REVISION  04/14/2012   Procedure: TOTAL HIP REVISION;  Surgeon: [name], MD;  Location: [facility] OR;  Service: Orthopedics;  Laterality: Right;  right total hip revision   TOTAL HIP REVISION Left 11/30/2013   Procedure: TOTAL HIP REVISION;  Surgeon: [name], MD;  Location: [facility] OR;  Service: Orthopedics;  Laterality: Left;   TOTAL KNEE ARTHROPLASTY Right 12/13/2020   Procedure: TOTAL KNEE ARTHROPLASTY;  Surgeon: Durene Romans, MD;  Location: [facility] ORS;  Service: Orthopedics;  Laterality: Right;  70 mins   Social History   Tobacco Use  Smoking Status  Former   Current packs/day: 0.00   Average packs/day: 0.3 packs/day for 42.0 years (10.5 ttl pk-yrs)   Types: Cigarettes   Start date: 10/22/1976   Quit date: 10/22/2018   Years since quitting: 4.7  Smokeless Tobacco Never  Attended 21 sessions/10 educational  Fit test: Cardio march: 239 to 290 Sit to stand: 9 to 10 Bicep curls: 14 to 18 Balance improved.  Encouraged to cont to exercise and work on balance  Bonnye Fava 07/04/2023, 4:32 PM

## 2023-08-06 ENCOUNTER — Encounter: Payer: Self-pay | Admitting: Internal Medicine

## 2023-08-06 ENCOUNTER — Ambulatory Visit: Payer: Medicare Other | Admitting: Internal Medicine

## 2023-08-06 ENCOUNTER — Ambulatory Visit (HOSPITAL_COMMUNITY)
Admission: RE | Admit: 2023-08-06 | Discharge: 2023-08-06 | Disposition: A | Discharge status: Home / Self Care | Payer: Medicare Other | Source: Ambulatory Visit | Attending: Internal Medicine | Admitting: Internal Medicine

## 2023-08-06 VITALS — BP 108/68 | HR 62 | Temp 97.7°F | Ht 60.0 in | Wt 167.8 lb

## 2023-08-06 DIAGNOSIS — I700 Atherosclerosis of aorta: Secondary | ICD-10-CM

## 2023-08-06 DIAGNOSIS — E7800 Pure hypercholesterolemia, unspecified: Secondary | ICD-10-CM

## 2023-08-06 DIAGNOSIS — Z6832 Body mass index (BMI) 32.0-32.9, adult: Secondary | ICD-10-CM | POA: Diagnosis not present

## 2023-08-06 DIAGNOSIS — Z23 Encounter for immunization: Secondary | ICD-10-CM

## 2023-08-06 DIAGNOSIS — F5104 Psychophysiologic insomnia: Secondary | ICD-10-CM

## 2023-08-06 DIAGNOSIS — Z86711 Personal history of pulmonary embolism: Secondary | ICD-10-CM

## 2023-08-06 DIAGNOSIS — R7309 Other abnormal glucose: Secondary | ICD-10-CM

## 2023-08-06 DIAGNOSIS — M7989 Other specified soft tissue disorders: Secondary | ICD-10-CM | POA: Insufficient documentation

## 2023-08-06 DIAGNOSIS — Z86718 Personal history of other venous thrombosis and embolism: Secondary | ICD-10-CM | POA: Diagnosis not present

## 2023-08-06 DIAGNOSIS — E66811 Obesity, class 1: Secondary | ICD-10-CM

## 2023-08-06 DIAGNOSIS — E6609 Other obesity due to excess calories: Secondary | ICD-10-CM | POA: Diagnosis not present

## 2023-08-06 NOTE — Progress Notes (Signed)
I,Victoria T Deloria Lair, CMA,acting as a Neurosurgeon for [name], MD.,have documented all relevant documentation on the behalf of [name], MD,as directed by  [name], MD while in the presence of [name], MD.  Subjective:  Patient ID: [name] , female    DOB: Dec 23, 1950 , 72 y.o.   MRN: [id number]  No chief complaint on file.   HPI  Patient presents today for leg & ankle swelling. She reports this initially started 2 weeks ago. She admits this issue has been on & off. She experiences pain as well. She denies taking any recent trips. However, she later adds that she drove to [place] which took 9 hours. This was in September, she stayed there two nights and then drove back.   At home she has methocarbamol & Tylenol. She admits this did help to relieve some of her pain.   She also would like a refill for Ambien.      Past Medical History:  Diagnosis Date   Aortic atherosclerosis (HCC) 04/16/2021   Arthritis    Cataract    Diverticulosis    H/O blood transfusion reaction 2010   hives   H/O migraine    last 26yrs ago   Headache(784.0)    Heart murmur    takes Bystolic nightly   History of blood clots 47yrs   was on Lovenox injections and Coumadin(was only on that for short period of time)   History of shingles 3-44yrs ago   Hx of seasonal allergies    takes Levocetirizine prn   Hyperlipidemia    takes Crestor nightly   Hypertension    takes Maxzide daily   Insomnia    takes Ambien prn   Joint pain    Joint swelling    Moderate tricuspid regurgitation    Pre-diabetes    Trigeminal neuralgia of right side of face 03/26/2016   V2 distribution     Family History  Problem Relation Age of Onset   Heart attack Mother    Alzheimer's disease Mother    Diabetes Sister    Hypertension Sister      Current Outpatient Medications:    acetaminophen (TYLENOL) 325 MG tablet, Take 650 mg by mouth every 6 (six) hours as needed for headache., Disp: , Rfl:     Cholecalciferol (DIALYVITE VITAMIN D 5000) 125 MCG (5000 UT) capsule, Take 5,000 Units by mouth daily., Disp: , Rfl:    doxycycline (VIBRA-TABS) 100 MG tablet, Take 1 tablet (100 mg total) by mouth 2 (two) times daily., Disp: 20 tablet, Rfl: 0   lidocaine (LIDODERM) 5 %, Place 1 patch onto the skin daily. Remove & Discard patch within 12 hours or as directed by MD, Disp: 30 patch, Rfl: 0   methocarbamol (ROBAXIN) 500 MG tablet, Take 1 tablet (500 mg total) by mouth 2 (two) times daily as needed for muscle spasms., Disp: 40 tablet, Rfl: 0   nebivolol (BYSTOLIC) 5 MG tablet, TAKE 1 TABLET DAILY, Disp: 90 tablet, Rfl: 3   ondansetron (ZOFRAN-ODT) 4 MG disintegrating tablet, Take 1 tablet (4 mg total) by mouth every 8 (eight) hours as needed for nausea or vomiting., Disp: 20 tablet, Rfl: 0   pregabalin (LYRICA) 75 MG capsule, Take 1 capsule (75 mg total) by mouth daily. TAKE 1 CAPSULE(75 MG) BY MOUTH DAILY, Disp: 90 capsule, Rfl: 2   rosuvastatin (CRESTOR) 10 MG tablet, TAKE 1 TABLET DAILY, Disp: 90 tablet, Rfl: 3   triamterene-hydrochlorothiazide (MAXZIDE-25) 37.5-25 MG tablet,  TAKE 1 TABLET DAILY, Disp: 90 tablet, Rfl: 3   zolpidem (AMBIEN) 5 MG tablet, Take 1 tablet (5 mg total) by mouth at bedtime as needed. for sleep, Disp: 60 tablet, Rfl: 2   ofloxacin (OCUFLOX) 0.3 % ophthalmic solution, INSTILL 1 DROP IN RIGHT EYE FOUR TIMES DAILY. START 1 DAY BEFORE SURGERY (Patient not taking: Reported on 11/22/2022), Disp: , Rfl:    prednisoLONE acetate (PRED FORTE) 1 % ophthalmic suspension, SHAKE LIQUID AND INSTILL 1 DROP IN RIGHT EYE FOUR TIMES DAILY AFTER SURGERY (Patient not taking: Reported on 11/22/2022), Disp: , Rfl:    SYSTANE 0.4-0.3 % GEL ophthalmic gel, SMARTSIG:1 Drop(s) In Eye(s) PRN (Patient not taking: Reported on 11/22/2022), Disp: , Rfl:    traMADol (ULTRAM) 50 MG tablet, Take 1 tablet (50 mg total) by mouth 2 (two) times daily as needed. (Patient not taking: Reported on 08/06/2023), Disp: 30  tablet, Rfl: 0   triamcinolone ointment (KENALOG) 0.1 %, APPLY TOPICALLY TO THE AFFECTED AREA TWICE DAILY (Patient not taking: Reported on 08/14/2022), Disp: , Rfl:    Allergies  Allergen Reactions   Aspirin Anaphylaxis and Other (See Comments)   Ibuprofen Hives, Swelling and Other (See Comments)   Quinapril Hcl Anaphylaxis   Penicillin G Other (See Comments)   Quinapril Other (See Comments)   Penicillins Hives, Swelling and Other (See Comments)    Has patient had a PCN reaction causing immediate rash, facial/tongue/throat swelling, SOB or lightheadedness with hypotension: Yes  Has patient had a PCN reaction causing severe rash involving mucus membranes or skin necrosis: No  Has patient had a PCN reaction that required hospitalization: No  Has patient had a PCN reaction occurring within the last 10 years: No  If all of the above answers are "NO", then may proceed with Cephalosporin use.  Tolerated Cephalosporin Date: 12/13/20.   Pravastatin Rash     Review of Systems  Constitutional: Negative.   Respiratory: Negative.    Cardiovascular:  Positive for leg swelling.  Gastrointestinal: Negative.   Neurological: Negative.   Psychiatric/Behavioral:  Positive for sleep disturbance.      Today's Vitals   08/06/23 1027  BP: 108/68  Pulse: 62  Temp: 97.7 F (36.5 C)  SpO2: 98%  Weight: 167 lb 12.8 oz (76.1 kg)  Height: 5' (1.524 m)   Body mass index is 32.77 kg/m.  Wt Readings from Last 3 Encounters:  08/06/23 167 lb 12.8 oz (76.1 kg)  07/04/23 168 lb (76.2 kg)  07/02/23 167 lb (75.8 kg)     Objective:  Physical Exam Vitals and nursing note reviewed.  Constitutional:      Appearance: Normal appearance. She is obese.  HENT:     Head: Normocephalic and atraumatic.  Eyes:     Extraocular Movements: Extraocular movements intact.  Cardiovascular:     Rate and Rhythm: Normal rate and regular rhythm.     Heart sounds: Normal heart sounds.  Pulmonary:     Effort:  Pulmonary effort is normal.     Breath sounds: Normal breath sounds.  Musculoskeletal:        General: Swelling and tenderness present.     Cervical back: Normal range of motion.     Left lower leg: Edema present.     Comments: RLE - 14 inches LLE 15.25 inches  Skin:    General: Skin is warm.  Neurological:     General: No focal deficit present.     Mental Status: She is alert.  Psychiatric:  Mood and Affect: Mood normal.        Behavior: Behavior normal.         Assessment And Plan:  Left leg swelling Assessment & Plan: She agrees to LLE venous doppler. I will check D-dimer today as well. She does have h/o PE. She is not currently on any anticoagulation. She is allergic to ASA.   Orders: -     VAS [place] LOWER EXTREMITY VENOUS (DVT); Future -     D-dimer, quantitative  Chronic insomnia Assessment & Plan: Chronic, sx are only relieved with zolpidem 5mg  nightly. Refill sent as requested.    Pure hypercholesterolemia Assessment & Plan: Chronic, she will continue with rosuvastatin 10mg  daily. She is encouraged to follow heart healthy diet and lifestyle.   Orders: -     CMP14+EGFR -     CBC -     Lipid panel  Class 1 obesity due to excess calories with serious comorbidity and body mass index (BMI) of 32.0 to 32.9 in adult Assessment & Plan: She is encouraged to strive for BMI less than 30 to decrease cardiac risk. Advised to aim for at least 150 minutes of exercise per week.    Immunization due -     Flu Vaccine Trivalent High Dose (Fluad)  History of pulmonary embolism -     Protein S, total and free -     Factor 5 leiden -     Antithrombin III  She is encouraged to strive for BMI less than 30 to decrease cardiac risk. Advised to aim for at least 150 minutes of exercise per week.    Return if symptoms worsen or fail to improve.  Patient was given opportunity to ask questions. Patient verbalized understanding of the plan and was able to repeat key elements  of the plan. All questions were answered to their satisfaction.    I, [name], MD, have reviewed all documentation for this visit. The documentation on 08/06/23 for the exam, diagnosis, procedures, and orders are all accurate and complete.   IF YOU HAVE BEEN REFERRED TO A SPECIALIST, IT MAY TAKE 1-2 WEEKS TO SCHEDULE/PROCESS THE REFERRAL. IF YOU HAVE NOT HEARD FROM US/SPECIALIST IN TWO WEEKS, PLEASE GIVE [place] A CALL AT [phone].   THE PATIENT IS ENCOURAGED TO PRACTICE SOCIAL DISTANCING DUE TO THE COVID-19 PANDEMIC.

## 2023-08-06 NOTE — Assessment & Plan Note (Signed)
She is encouraged to strive for BMI less than 30 to decrease cardiac risk. Advised to aim for at least 150 minutes of exercise per week.  

## 2023-08-06 NOTE — Patient Instructions (Signed)
Deep Vein Thrombosis  Deep vein thrombosis (DVT) is a condition in which a blood clot forms in a vein of the deep venous system. This can occur in the lower leg, thigh, pelvis, arm, or neck. A clot is blood that has thickened into a gel or solid. This condition is serious and can be life-threatening if the clot travels to the arteries of the lungs and causes a blockage (pulmonary embolism). A DVT can also damage veins in the leg, which can lead to long-term venous disease, leg pain, swelling, discoloration, and ulcers or sores (post-thrombotic syndrome). What are the causes? This condition may be caused by: A slowdown of blood flow. Damage to a vein. A condition that causes blood to clot more easily, such as certain bleeding disorders. What increases the risk? The following factors may make you more likely to develop this condition: Obesity. Being older, especially older than age 45. Being inactive or not moving around (sedentary lifestyle). This may include: Sitting or lying down for longer than 4-6 hours other than to sleep at night. Being in the hospital, or having major or lengthy surgery. Having any recent bone injuries, such as breaks (fractures), that reduce movement, especially in the lower extremities. Having recent orthopedic surgery on the lower extremities. Being pregnant, giving birth, or having recently given birth. Taking medicines that contain estrogen, such as birth control or hormone replacement therapy. Using products that contain nicotine or tobacco, especially if you use hormonal birth control. Having a history of a blood vessel disease (peripheral vascular disease) or congestive heart disease. Having a history of cancer, especially if being treated with chemotherapy. What are the signs or symptoms? Symptoms of this condition include: Swelling, pain, pressure, or tenderness in an arm or a leg. An arm or a leg becoming warm, red, or discolored. A leg turning very pale or  blue. You may have a large DVT. This is rare. If the clot is in your leg, you may notice that symptoms get worse when you stand or walk. In some cases, there are no symptoms. How is this diagnosed? This condition is diagnosed with: Your medical history and a physical exam. Tests, such as: Blood tests to check how well your blood clots. Doppler ultrasound. This is the best way to find a DVT. CT venogram. Contrast dye is injected into a vein, and X-rays are taken to check for clots. This is helpful for veins in the chest or pelvis. How is this treated? Treatment for this condition depends on: The cause of your DVT. The size and location of your DVT, or having more than one DVT. Your risk for bleeding or developing more clots. Other medical conditions you may have. Treatment may include: Taking a blood thinner medicine (anticoagulant) to prevent more clots from forming or current clots from growing. Wearing compression stockings. Injecting medicines into the affected vein to break up the clot (catheter-directed thrombolysis). Surgical procedures, when DVT is severe or hard to treat. These may be done to: Isolate and remove your clot. Place an inferior vena cava (IVC) filter. This filter is placed into a large vein called the inferior vena cava to catch blood clots before they reach your lungs. You may get some medical treatments for 6 months or longer. Follow these instructions at home: If you are taking blood thinners: Talk with your health care provider before you take any medicines that contain aspirin or NSAIDs, such as ibuprofen. These medicines increase your risk for dangerous bleeding. Take your medicine exactly  as told, at the same time every day. Do not skip a dose. Do not take more than the prescribed dose. This is important. Ask your health care provider about foods and medicines that could change or interact with the way your blood thinner works. Avoid these foods and medicines  if you are told to do so. Avoid anything that may cause bleeding or bruising. You may bleed more easily while taking blood thinners. Be very careful when using knives, scissors, or other sharp objects. Use an electric razor instead of a blade. Avoid activities that could cause injury or bruising, and follow instructions for preventing falls. Tell your health care provider if you have had any internal bleeding, bleeding ulcers, or neurologic diseases, such as strokes or cerebral aneurysms. Wear a medical alert bracelet or carry a card that lists what medicines you take. General instructions Take over-the-counter and prescription medicines only as told by your health care provider. Return to your normal activities as told by your health care provider. Ask your health care provider what activities are safe for you. If recommended, wear compression stockings as told by your health care provider. These stockings help to prevent blood clots and reduce swelling in your legs. Never wear your compression stockings while sleeping at night. Keep all follow-up visits. This is important. Where to find more information American Heart Association: www.heart.org Centers for Disease Control and Prevention: FootballExhibition.com.br National Heart, Lung, and Blood Institute: PopSteam.is Contact a health care provider if: You miss a dose of your blood thinner. You have unusual bruising or other color changes. You have new or worse pain, swelling, or redness in an arm or a leg. You have worsening numbness or tingling in an arm or a leg. You have a significant color change (pale or blue) in the extremity that has the DVT. Get help right away if: You have signs or symptoms that a blood clot has moved to the lungs. These may include: Shortness of breath. Chest pain. Fast or irregular heartbeats (palpitations). Light-headedness, dizziness, or fainting. Coughing up blood. You have signs or symptoms that your blood is  too thin. These may include: Blood in your vomit, stool, or urine. A cut that will not stop bleeding. A menstrual period that is heavier than usual. A severe headache or confusion. These symptoms may be an emergency. Get help right away. Call 911. Do not wait to see if the symptoms will go away. Do not drive yourself to the hospital. Summary Deep vein thrombosis (DVT) happens when a blood clot forms in a deep vein. This may occur in the lower leg, thigh, pelvis, arm, or neck. Symptoms affect the arm or leg and can include swelling, pain, tenderness, warmth, redness, or discoloration. This condition may be treated with medicines. In severe cases, a procedure or surgery may be done to remove or dissolve the clots. If you are taking blood thinners, take them exactly as told. Do not skip a dose. Do not take more than is prescribed. Get help right away if you have a severe headache, shortness of breath, chest pain, fast or irregular heartbeats, or blood in your vomit, urine, or stool. This information is not intended to replace advice given to you by your health care provider. Make sure you discuss any questions you have with your health care provider. Document Revised: 05/01/2021 Document Reviewed: 05/01/2021 Elsevier Patient Education  2024 [employer].

## 2023-08-06 NOTE — Assessment & Plan Note (Signed)
Chronic, sx are only relieved with zolpidem 5mg  nightly. Refill sent as requested.

## 2023-08-09 ENCOUNTER — Encounter: Payer: Self-pay | Admitting: Internal Medicine

## 2023-08-12 LAB — CBC
Hematocrit: 43.4 % (ref 34.0–46.6)
Hemoglobin: 13.6 g/dL (ref 11.1–15.9)
MCH: 27.6 pg (ref 26.6–33.0)
MCHC: 31.3 g/dL — ABNORMAL LOW (ref 31.5–35.7)
MCV: 88 fL (ref 79–97)
Platelets: 144 10*3/uL — ABNORMAL LOW (ref 150–450)
RBC: 4.92 x10E6/uL (ref 3.77–5.28)
RDW: 13.5 % (ref 11.7–15.4)
WBC: 4.7 10*3/uL (ref 3.4–10.8)

## 2023-08-12 LAB — FACTOR 5 LEIDEN

## 2023-08-12 LAB — CMP14+EGFR
ALT: 12 [IU]/L (ref 0–32)
AST: 21 [IU]/L (ref 0–40)
Albumin: 4.2 g/dL (ref 3.8–4.8)
Alkaline Phosphatase: 78 [IU]/L (ref 44–121)
BUN/Creatinine Ratio: 29 — ABNORMAL HIGH (ref 12–28)
BUN: 23 mg/dL (ref 8–27)
Bilirubin Total: 0.4 mg/dL (ref 0.0–1.2)
CO2: 23 mmol/L (ref 20–29)
Calcium: 9.2 mg/dL (ref 8.7–10.3)
Chloride: 103 mmol/L (ref 96–106)
Creatinine, Ser: 0.8 mg/dL (ref 0.57–1.00)
Globulin, Total: 2.7 g/dL (ref 1.5–4.5)
Glucose: 100 mg/dL — ABNORMAL HIGH (ref 70–99)
Potassium: 4 mmol/L (ref 3.5–5.2)
Sodium: 143 mmol/L (ref 134–144)
Total Protein: 6.9 g/dL (ref 6.0–8.5)
eGFR: 78 mL/min/{1.73_m2} (ref >59)

## 2023-08-12 LAB — ANTITHROMBIN III: AntiThromb III Func: 123 % (ref 75–135)

## 2023-08-12 LAB — LIPID PANEL
Chol/HDL Ratio: 2.3 {ratio} (ref 0.0–4.4)
Cholesterol, Total: 140 mg/dL (ref 100–199)
HDL: 61 mg/dL (ref >39)
LDL Chol Calc (NIH): 64 mg/dL (ref 0–99)
Triglycerides: 79 mg/dL (ref 0–149)
VLDL Cholesterol Cal: 15 mg/dL (ref 5–40)

## 2023-08-12 LAB — PROTEIN S, TOTAL AND FREE
Protein S Ag, Free: 73 % (ref 61–136)
Protein S Ag, Total: 98 % (ref 60–150)

## 2023-08-12 LAB — D-DIMER, QUANTITATIVE: D-DIMER: 1.14 mg{FEU}/L — ABNORMAL HIGH (ref 0.00–0.49)

## 2023-08-14 DIAGNOSIS — M25562 Pain in left knee: Secondary | ICD-10-CM | POA: Diagnosis not present

## 2023-08-18 NOTE — Assessment & Plan Note (Signed)
Chronic, she will continue with rosuvastatin 10mg  daily. She is encouraged to follow heart healthy diet and lifestyle.

## 2023-08-18 NOTE — Assessment & Plan Note (Signed)
She agrees to LLE venous doppler. I will check D-dimer today as well. She does have h/o PE. She is not currently on any anticoagulation. She is allergic to ASA.

## 2023-08-27 ENCOUNTER — Encounter: Payer: Self-pay | Admitting: Internal Medicine

## 2023-08-29 ENCOUNTER — Other Ambulatory Visit: Payer: Self-pay | Admitting: Internal Medicine

## 2023-08-29 MED ORDER — TRAZODONE HCL 50 MG PO TABS
50.0000 mg | ORAL_TABLET | Freq: Every day | ORAL | 1 refills | Status: DC
Start: 2023-08-29 — End: 2023-10-30

## 2023-09-11 DIAGNOSIS — M25562 Pain in left knee: Secondary | ICD-10-CM | POA: Diagnosis not present

## 2023-09-11 DIAGNOSIS — M25561 Pain in right knee: Secondary | ICD-10-CM | POA: Diagnosis not present

## 2023-09-11 DIAGNOSIS — Z96653 Presence of artificial knee joint, bilateral: Secondary | ICD-10-CM | POA: Diagnosis not present

## 2023-10-28 ENCOUNTER — Other Ambulatory Visit: Payer: Self-pay | Admitting: Internal Medicine

## 2023-10-30 DIAGNOSIS — L239 Allergic contact dermatitis, unspecified cause: Secondary | ICD-10-CM | POA: Diagnosis not present

## 2023-11-07 ENCOUNTER — Ambulatory Visit: Payer: Medicare Other | Admitting: Internal Medicine

## 2023-11-07 ENCOUNTER — Encounter: Payer: Self-pay | Admitting: Internal Medicine

## 2023-11-07 VITALS — BP 108/64 | HR 75 | Temp 98.7°F | Ht 60.0 in | Wt 173.8 lb

## 2023-11-07 DIAGNOSIS — E6609 Other obesity due to excess calories: Secondary | ICD-10-CM | POA: Diagnosis not present

## 2023-11-07 DIAGNOSIS — R6883 Chills (without fever): Secondary | ICD-10-CM | POA: Diagnosis not present

## 2023-11-07 DIAGNOSIS — I110 Hypertensive heart disease with heart failure: Secondary | ICD-10-CM

## 2023-11-07 DIAGNOSIS — R5383 Other fatigue: Secondary | ICD-10-CM

## 2023-11-07 DIAGNOSIS — R200 Anesthesia of skin: Secondary | ICD-10-CM | POA: Diagnosis not present

## 2023-11-07 DIAGNOSIS — I5032 Chronic diastolic (congestive) heart failure: Secondary | ICD-10-CM | POA: Diagnosis not present

## 2023-11-07 DIAGNOSIS — Z6833 Body mass index (BMI) 33.0-33.9, adult: Secondary | ICD-10-CM

## 2023-11-07 DIAGNOSIS — Z79899 Other long term (current) drug therapy: Secondary | ICD-10-CM

## 2023-11-07 DIAGNOSIS — E66811 Obesity, class 1: Secondary | ICD-10-CM | POA: Diagnosis not present

## 2023-11-07 MED ORDER — CYANOCOBALAMIN 1000 MCG/ML IJ SOLN
1000.0000 ug | Freq: Once | INTRAMUSCULAR | Status: AC
Start: 2023-11-07 — End: 2023-11-07
  Administered 2023-11-07: 1000 ug via INTRAMUSCULAR

## 2023-11-07 NOTE — Progress Notes (Signed)
I,Victoria T Deloria Lair, CMA,acting as a Neurosurgeon for [name], MD.,have documented all relevant documentation on the behalf of [name], MD,as directed by  [name], MD while in the presence of [name], MD.  Subjective:  Patient ID: [name] , female    DOB: 1951-06-25 , 73 y.o.   MRN: [id number]  Chief Complaint  Patient presents with   Chills    HPI  Patient presents today for further evaluation of chills without fever or cold-like symptoms. She states her sx started about a week ago. She denies ill contacts. She reports feeling more tired than usual. There is no associated arthralgias. She has been walking her dog regularly.   She also complains of numbness in her left ringer. She states it initially occurred while outside walking her dog. She initially thought her sx were due to the cold weather. She is concerned because her sx are now occurring when inside.       Past Medical History:  Diagnosis Date   Aortic atherosclerosis (HCC) 04/16/2021   Arthritis    Cataract    Diverticulosis    H/O blood transfusion reaction 2010   hives   H/O migraine    last 74yrs ago   Headache(784.0)    Heart murmur    takes Bystolic nightly   History of blood clots 45yrs   was on Lovenox injections and Coumadin(was only on that for short period of time)   History of shingles 3-66yrs ago   Hx of seasonal allergies    takes Levocetirizine prn   Hyperlipidemia    takes Crestor nightly   Hypertension    takes Maxzide daily   Insomnia    takes Ambien prn   Joint pain    Joint swelling    Moderate tricuspid regurgitation    Pre-diabetes    Trigeminal neuralgia of right side of face 03/26/2016   V2 distribution     Family History  Problem Relation Age of Onset   Heart attack Mother    Alzheimer's disease Mother    Diabetes Sister    Hypertension Sister      Current Outpatient Medications:    acetaminophen (TYLENOL) 325 MG tablet, Take 650 mg by mouth  every 6 (six) hours as needed for headache., Disp: , Rfl:    Cholecalciferol (DIALYVITE VITAMIN D 5000) 125 MCG (5000 UT) capsule, Take 5,000 Units by mouth daily., Disp: , Rfl:    lidocaine (LIDODERM) 5 %, Place 1 patch onto the skin daily. Remove & Discard patch within 12 hours or as directed by MD, Disp: 30 patch, Rfl: 0   methocarbamol (ROBAXIN) 500 MG tablet, Take 1 tablet (500 mg total) by mouth 2 (two) times daily as needed for muscle spasms., Disp: 40 tablet, Rfl: 0   nebivolol (BYSTOLIC) 5 MG tablet, TAKE 1 TABLET DAILY, Disp: 90 tablet, Rfl: 3   ondansetron (ZOFRAN-ODT) 4 MG disintegrating tablet, Take 1 tablet (4 mg total) by mouth every 8 (eight) hours as needed for nausea or vomiting., Disp: 20 tablet, Rfl: 0   pregabalin (LYRICA) 75 MG capsule, Take 1 capsule (75 mg total) by mouth daily. TAKE 1 CAPSULE(75 MG) BY MOUTH DAILY, Disp: 90 capsule, Rfl: 2   rosuvastatin (CRESTOR) 10 MG tablet, TAKE 1 TABLET DAILY, Disp: 90 tablet, Rfl: 3   traZODone (DESYREL) 50 MG tablet, TAKE 1 TABLET(50 MG) BY MOUTH AT BEDTIME, Disp: 30 tablet, Rfl: 1   triamcinolone ointment (KENALOG) 0.1 %, , Disp: ,  Rfl:    triamterene-hydrochlorothiazide (MAXZIDE-25) 37.5-25 MG tablet, TAKE 1 TABLET DAILY, Disp: 90 tablet, Rfl: 3   zolpidem (AMBIEN) 5 MG tablet, Take 1 tablet (5 mg total) by mouth at bedtime as needed. for sleep, Disp: 60 tablet, Rfl: 2   ofloxacin (OCUFLOX) 0.3 % ophthalmic solution, INSTILL 1 DROP IN RIGHT EYE FOUR TIMES DAILY. START 1 DAY BEFORE SURGERY (Patient not taking: Reported on 11/07/2023), Disp: , Rfl:    prednisoLONE acetate (PRED FORTE) 1 % ophthalmic suspension, SHAKE LIQUID AND INSTILL 1 DROP IN RIGHT EYE FOUR TIMES DAILY AFTER SURGERY (Patient not taking: Reported on 11/07/2023), Disp: , Rfl:    SYSTANE 0.4-0.3 % GEL ophthalmic gel, SMARTSIG:1 Drop(s) In Eye(s) PRN (Patient not taking: Reported on 11/07/2023), Disp: , Rfl:    traMADol (ULTRAM) 50 MG tablet, Take 1 tablet (50 mg total) by  mouth 2 (two) times daily as needed. (Patient not taking: Reported on 11/07/2023), Disp: 30 tablet, Rfl: 0   Allergies  Allergen Reactions   Aspirin Anaphylaxis and Other (See Comments)   Ibuprofen Hives, Swelling and Other (See Comments)   Quinapril Hcl Anaphylaxis   Penicillin G Other (See Comments)   Quinapril Other (See Comments)   Penicillins Hives, Swelling and Other (See Comments)    Has patient had a PCN reaction causing immediate rash, facial/tongue/throat swelling, SOB or lightheadedness with hypotension: Yes  Has patient had a PCN reaction causing severe rash involving mucus membranes or skin necrosis: No  Has patient had a PCN reaction that required hospitalization: No  Has patient had a PCN reaction occurring within the last 10 years: No  If all of the above answers are "NO", then may proceed with Cephalosporin use.  Tolerated Cephalosporin Date: 12/13/20.   Pravastatin Rash     Review of Systems  Constitutional:  Positive for chills.  Respiratory: Negative.    Cardiovascular: Negative.   Gastrointestinal: Negative.   Neurological: Negative.   Psychiatric/Behavioral: Negative.       Today's Vitals   11/07/23 1533  BP: 108/64  Pulse: 75  Temp: 98.7 F (37.1 C)  SpO2: 98%  Weight: 173 lb 12.8 oz (78.8 kg)  Height: 5' (1.524 m)   Body mass index is 33.94 kg/m.  Wt Readings from Last 3 Encounters:  11/07/23 173 lb 12.8 oz (78.8 kg)  08/06/23 167 lb 12.8 oz (76.1 kg)  07/04/23 168 lb (76.2 kg)     Objective:  Physical Exam Vitals and nursing note reviewed.  Constitutional:      Appearance: Normal appearance.  HENT:     Head: Normocephalic and atraumatic.  Eyes:     Extraocular Movements: Extraocular movements intact.  Cardiovascular:     Rate and Rhythm: Normal rate and regular rhythm.     Heart sounds: Normal heart sounds.  Pulmonary:     Effort: Pulmonary effort is normal.     Breath sounds: Normal breath sounds.  Musculoskeletal:      Cervical back: Normal range of motion.  Skin:    General: Skin is warm.  Neurological:     General: No focal deficit present.     Mental Status: She is alert.  Psychiatric:        Mood and Affect: Mood normal.        Behavior: Behavior normal.         Assessment And Plan:  Chills (without fever) Assessment & Plan: No fever noted. I will check labs as below. She will let me know if her sx  persist.  She has not had any night sweats or unexplained weight loss.  Orders: -     CBC with Differential/Platelet  Numbness of finger Assessment & Plan: I will check labs as below.  Pt advised her sx could be due to Raynaud's phenomenon.  She was given information to read on this topic. Advised to keep her hands warm and to wear gloves when outside.  Orders: -     CMP14+EGFR -     TSH  Other fatigue Assessment & Plan: She is encouraged to stay well hydrated. I will check labs as below.   Orders: -     CBC with Differential/Platelet -     CMP14+EGFR -     TSH -     ANA, IFA (with reflex) -     Cyanocobalamin  Hypertensive heart disease with chronic diastolic congestive heart failure (HCC) Assessment & Plan: Chronic, well controlled.  She will continue with nebivolol 5mg  daily and Maxzide 37.5/25mg  daily. Encouraged to follow low sodium diet.    Class 1 obesity due to excess calories with serious comorbidity and body mass index (BMI) of 33.0 to 33.9 in adult Assessment & Plan: She is encouraged to strive for BMI less than 30 to decrease cardiac risk. Advised to aim for at least 150 minutes of exercise per week.    Drug therapy -     Vitamin B12  She is encouraged to strive for BMI less than 30 to decrease cardiac risk. Advised to aim for at least 150 minutes of exercise per week.    Return if symptoms worsen or fail to improve.  Patient was given opportunity to ask questions. Patient verbalized understanding of the plan and was able to repeat key elements of the plan. All  questions were answered to their satisfaction.   I, [name], MD, have reviewed all documentation for this visit. The documentation on 11/07/23 for the exam, diagnosis, procedures, and orders are all accurate and complete.   IF YOU HAVE BEEN REFERRED TO A SPECIALIST, IT MAY TAKE 1-2 WEEKS TO SCHEDULE/PROCESS THE REFERRAL. IF YOU HAVE NOT HEARD FROM US/SPECIALIST IN TWO WEEKS, PLEASE GIVE [place] A CALL AT [phone].   THE PATIENT IS ENCOURAGED TO PRACTICE SOCIAL DISTANCING DUE TO THE COVID-19 PANDEMIC.

## 2023-11-07 NOTE — Patient Instructions (Addendum)
Ginger/turmeric tea  Fatigue If you have fatigue, you feel tired all the time and have a lack of energy or a lack of motivation. Fatigue may make it difficult to start or complete tasks because of exhaustion. Occasional or mild fatigue is often a normal response to activity or life. However, long-term (chronic) or extreme fatigue may be a symptom of a medical condition such as: Depression. Not having enough red blood cells or hemoglobin in the blood (anemia). A problem with a small gland located in the lower front part of the neck (thyroid disorder). Rheumatologic conditions. These are problems related to the body's defense system (immune system). Infections, especially certain viral infections. Fatigue can also lead to negative health outcomes over time. Follow these instructions at home: Medicines Take over-the-counter and prescription medicines only as told by your health care provider. Take a multivitamin if told by your health care provider. Do not use herbal or dietary supplements unless they are approved by your health care provider. Eating and drinking  Avoid heavy meals in the evening. Eat a well-balanced diet, which includes lean proteins, whole grains, plenty of fruits and vegetables, and low-fat dairy products. Avoid eating or drinking too many products with caffeine in them. Avoid alcohol. Drink enough fluid to keep your urine pale yellow. Activity  Exercise regularly, as told by your health care provider. Use or practice techniques to help you relax, such as yoga, tai chi, meditation, or massage therapy. Lifestyle Change situations that cause you stress. Try to keep your work and personal schedules in balance. Do not use recreational or illegal drugs. General instructions Monitor your fatigue for any changes. Go to bed and get up at the same time every day. Avoid fatigue by pacing yourself during the day and getting enough sleep at night. Maintain a healthy  weight. Contact a health care provider if: Your fatigue does not get better. You have a fever. You suddenly lose or gain weight. You have headaches. You have trouble falling asleep or sleeping through the night. You feel angry, guilty, anxious, or sad. You have swelling in your legs or another part of your body. Get help right away if: You feel confused, feel like you might faint, or faint. Your vision is blurry or you have a severe headache. You have severe pain in your abdomen, your back, or the area between your waist and hips (pelvis). You have chest pain, shortness of breath, or an irregular or fast heartbeat. You are unable to urinate, or you urinate less than normal. You have abnormal bleeding from the rectum, nose, lungs, nipples, or, if you are female, the vagina. You vomit blood. You have thoughts about hurting yourself or others. These symptoms may be an emergency. Get help right away. Call 911. Do not wait to see if the symptoms will go away. Do not drive yourself to the hospital. Get help right away if you feel like you may hurt yourself or others, or have thoughts about taking your own life. Go to your nearest emergency room or: Call 911. Call the National Suicide Prevention Lifeline at 706-072-8012 or 988. This is open 24 hours a day. Text the Crisis Text Line at 641 180 4369. Summary If you have fatigue, you feel tired all the time and have a lack of energy or a lack of motivation. Fatigue may make it difficult to start or complete tasks because of exhaustion. Long-term (chronic) or extreme fatigue may be a symptom of a medical condition. Exercise regularly, as told by your  health care provider. Change situations that cause you stress. Try to keep your work and personal schedules in balance. This information is not intended to replace advice given to you by your health care provider. Make sure you discuss any questions you have with your health care provider. Document  Revised: 07/31/2021 Document Reviewed: 07/31/2021 Elsevier Patient Education  2024 [employer].

## 2023-11-10 LAB — CBC WITH DIFFERENTIAL/PLATELET
Basophils Absolute: 0 10*3/uL (ref 0.0–0.2)
Basos: 1 %
EOS (ABSOLUTE): 0.1 10*3/uL (ref 0.0–0.4)
Eos: 2 %
Hematocrit: 43.9 % (ref 34.0–46.6)
Hemoglobin: 13.7 g/dL (ref 11.1–15.9)
Immature Grans (Abs): 0 10*3/uL (ref 0.0–0.1)
Immature Granulocytes: 0 %
Lymphocytes Absolute: 2 10*3/uL (ref 0.7–3.1)
Lymphs: 39 %
MCH: 26.9 pg (ref 26.6–33.0)
MCHC: 31.2 g/dL — ABNORMAL LOW (ref 31.5–35.7)
MCV: 86 fL (ref 79–97)
Monocytes Absolute: 0.4 10*3/uL (ref 0.1–0.9)
Monocytes: 8 %
Neutrophils Absolute: 2.6 10*3/uL (ref 1.4–7.0)
Neutrophils: 50 %
Platelets: 150 10*3/uL (ref 150–450)
RBC: 5.09 x10E6/uL (ref 3.77–5.28)
RDW: 13.5 % (ref 11.7–15.4)
WBC: 5.1 10*3/uL (ref 3.4–10.8)

## 2023-11-10 LAB — CMP14+EGFR
ALT: 11 [IU]/L (ref 0–32)
AST: 24 [IU]/L (ref 0–40)
Albumin: 4.2 g/dL (ref 3.8–4.8)
Alkaline Phosphatase: 84 [IU]/L (ref 44–121)
BUN/Creatinine Ratio: 20 (ref 12–28)
BUN: 16 mg/dL (ref 8–27)
Bilirubin Total: 0.2 mg/dL (ref 0.0–1.2)
CO2: 22 mmol/L (ref 20–29)
Calcium: 9.3 mg/dL (ref 8.7–10.3)
Chloride: 103 mmol/L (ref 96–106)
Creatinine, Ser: 0.82 mg/dL (ref 0.57–1.00)
Globulin, Total: 2.5 g/dL (ref 1.5–4.5)
Glucose: 100 mg/dL — ABNORMAL HIGH (ref 70–99)
Potassium: 4.6 mmol/L (ref 3.5–5.2)
Sodium: 141 mmol/L (ref 134–144)
Total Protein: 6.7 g/dL (ref 6.0–8.5)
eGFR: 75 mL/min/{1.73_m2} (ref >59)

## 2023-11-10 LAB — TSH: TSH: 1.03 u[IU]/mL (ref 0.450–4.500)

## 2023-11-10 LAB — ANTINUCLEAR ANTIBODIES, IFA: ANA Titer 1: NEGATIVE

## 2023-11-10 LAB — VITAMIN B12: Vitamin B-12: 551 pg/mL (ref 232–1245)

## 2023-11-17 DIAGNOSIS — R200 Anesthesia of skin: Secondary | ICD-10-CM | POA: Insufficient documentation

## 2023-11-17 DIAGNOSIS — R5383 Other fatigue: Secondary | ICD-10-CM | POA: Insufficient documentation

## 2023-11-17 DIAGNOSIS — R6883 Chills (without fever): Secondary | ICD-10-CM | POA: Insufficient documentation

## 2023-11-17 DIAGNOSIS — I110 Hypertensive heart disease with heart failure: Secondary | ICD-10-CM | POA: Insufficient documentation

## 2023-11-17 NOTE — Assessment & Plan Note (Signed)
She is encouraged to strive for BMI less than 30 to decrease cardiac risk. Advised to aim for at least 150 minutes of exercise per week.

## 2023-11-17 NOTE — Assessment & Plan Note (Signed)
She is encouraged to stay well hydrated. I will check labs as below.

## 2023-11-17 NOTE — Assessment & Plan Note (Signed)
Chronic, well controlled.  She will continue with nebivolol 5mg  daily and Maxzide 37.5/25mg  daily. Encouraged to follow low sodium diet.

## 2023-11-17 NOTE — Assessment & Plan Note (Signed)
No fever noted. I will check labs as below. She will let me know if her sx persist.  She has not had any night sweats or unexplained weight loss.

## 2023-11-17 NOTE — Assessment & Plan Note (Signed)
I will check labs as below.  Pt advised her sx could be due to Raynaud's phenomenon.  She was given information to read on this topic. Advised to keep her hands warm and to wear gloves when outside.

## 2023-11-29 ENCOUNTER — Telehealth: Payer: Self-pay

## 2023-11-29 ENCOUNTER — Encounter (HOSPITAL_COMMUNITY): Payer: Self-pay | Admitting: *Deleted

## 2023-11-29 ENCOUNTER — Ambulatory Visit (INDEPENDENT_AMBULATORY_CARE_PROVIDER_SITE_OTHER): Payer: TRICARE For Life (TFL)

## 2023-11-29 ENCOUNTER — Ambulatory Visit: Payer: Self-pay

## 2023-11-29 ENCOUNTER — Ambulatory Visit (HOSPITAL_COMMUNITY)
Admission: EM | Admit: 2023-11-29 | Discharge: 2023-11-29 | Disposition: A | Discharge status: Home / Self Care | Payer: TRICARE For Life (TFL) | Attending: Emergency Medicine | Admitting: Emergency Medicine

## 2023-11-29 DIAGNOSIS — Z96642 Presence of left artificial hip joint: Secondary | ICD-10-CM | POA: Diagnosis not present

## 2023-11-29 DIAGNOSIS — M25562 Pain in left knee: Secondary | ICD-10-CM

## 2023-11-29 DIAGNOSIS — M25531 Pain in right wrist: Secondary | ICD-10-CM

## 2023-11-29 DIAGNOSIS — M25462 Effusion, left knee: Secondary | ICD-10-CM | POA: Diagnosis not present

## 2023-11-29 DIAGNOSIS — Z043 Encounter for examination and observation following other accident: Secondary | ICD-10-CM | POA: Diagnosis not present

## 2023-11-29 DIAGNOSIS — M19031 Primary osteoarthritis, right wrist: Secondary | ICD-10-CM | POA: Diagnosis not present

## 2023-11-29 DIAGNOSIS — M1712 Unilateral primary osteoarthritis, left knee: Secondary | ICD-10-CM | POA: Diagnosis not present

## 2023-11-29 MED ORDER — METHOCARBAMOL 500 MG PO TABS: 500.0000 mg | ORAL_TABLET | Freq: Two times a day (BID) | ORAL | 0 refills | Status: AC

## 2023-11-29 MED ORDER — ACETAMINOPHEN 325 MG PO TABS
ORAL_TABLET | ORAL | Status: AC
  Filled 2023-11-29: qty 2

## 2023-11-29 MED ORDER — ACETAMINOPHEN 325 MG PO TABS
650.0000 mg | ORAL_TABLET | Freq: Once | ORAL | Status: AC
  Administered 2023-11-29: 650 mg via ORAL

## 2023-11-29 NOTE — ED Provider Notes (Addendum)
 [facility]    CSN: [id number] Arrival date & time: 11/29/23  1559      History   Chief Complaint Chief Complaint  Patient presents with   Fall   Knee Pain   Wrist Pain    HPI [name] is a 73 y.o. female.   Patient presents to clinic open for right wrist pain and left knee pain after a fall she sustained at [employer].  She was carrying her chicken and she is unsure what happened but she ended up on the ground.  Hit her left knee and tried to catch herself with her right wrist.  Left knee with swelling.  Does not think she had any loss of consciousness, but she is unsure why she fell.    Two employees helped her off the floor and to her car and she drove home.   She has been having a headache today.  Immediately afterwards she had knee pain and wrist pain, when she got home she took 2 Tylenol  and went to bed.  Is not currently having any distinct head pain.  No bruising or swelling.  No vision changes.  Ambulatory with discomfort in the left knee.  Is not on any blood thinners.  History of headaches, migraines, and arthritis.  The history is provided by the patient and medical records.  Fall  Knee Pain Wrist Pain    Past Medical History:  Diagnosis Date   Aortic atherosclerosis (HCC) 04/16/2021   Arthritis    Cataract    Diverticulosis    H/O blood transfusion reaction 2010   hives   H/O migraine    last 24yrs ago   Headache(784.0)    Heart murmur    takes Bystolic  nightly   History of blood clots 18yrs   was on Lovenox  injections and Coumadin(was only on that for short period of time)   History of shingles 3-47yrs ago   Hx of seasonal allergies    takes Levocetirizine prn   Hyperlipidemia    takes Crestor  nightly   Hypertension    takes Maxzide  daily   Insomnia    takes Ambien  prn   Joint pain    Joint swelling    Moderate tricuspid regurgitation    Pre-diabetes    Trigeminal neuralgia of right side of face 03/26/2016   V2  distribution    Patient Active Problem List   Diagnosis Date Noted   Chills (without fever) 11/17/2023   Numbness of finger 11/17/2023   Other fatigue 11/17/2023   Hypertensive heart disease with chronic diastolic congestive heart failure (HCC) 11/17/2023   Left leg swelling 08/06/2023   Adjustment disorder with depressed mood 11/22/2022   Coronary artery disease involving native coronary artery of native heart without angina pectoris 08/14/2022   Tension headache 08/14/2022   Arthralgia of left ankle 08/14/2022   Vitamin D  deficiency 08/14/2022   Hypertensive heart disease without heart failure 08/14/2022   Contusion of right knee 10/08/2021   Aortic atherosclerosis (HCC) 04/16/2021   Chest pain 04/15/2021   Heart failure due to valvular disease, unspecified heart failure type (HCC) 02/22/2021   Obese 12/14/2020   S/P total knee arthroplasty, 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   Moderate tricuspid regurgitation 10/20/2019   Bunion of great toe of right foot 01/12/2019   Cellulitis 12/23/2018   Foot infection    Great toe pain, right 12/17/2018   Prediabetes 12/17/2018   Cellulitis of foot 08/23/2017   Trigeminal neuralgia of right side of face 03/26/2016  Hyperlipidemia 12/04/2013   Benign essential HTN 12/04/2013   Constipation 12/04/2013   Chronic insomnia 12/04/2013   Acute blood loss anemia 12/04/2013   Left hip pain 12/04/2013   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   Pain due to total hip replacement (HCC) 04/16/2012    Past Surgical History:  Procedure Laterality Date   ABDOMINAL HYSTERECTOMY     BREAST BIOPSY  05/03/2006   US    BREAST REDUCTION SURGERY  1987   BUNIONECTOMY Right 05/11/2019   CATARACT EXTRACTION Left 07/2019   CHOLECYSTECTOMY     COLONOSCOPY     ESOPHAGOGASTRODUODENOSCOPY     EYE SURGERY Right    eye lash removed   JOINT REPLACEMENT     bilateral hip rt in 2007 and lt in 2010   NM MYOCAR PERF WALL MOTION  11/04/2008   Normal   REDUCTION MAMMAPLASTY  Bilateral 1988   REDUCTION MAMMAPLASTY     SHOULDER ARTHROSCOPY WITH DISTAL CLAVICLE RESECTION Right 09/21/2015   Procedure: RIGHT SHOULDER ARTHROSCOPY WITH DISTAL CLAVICLE EXCISION DEBRIDE LABRAL TEAR ACROMIOPLASTY ;  Surgeon: Dempsey Sensor, MD;  Location: [facility];  Service: Orthopedics;  Laterality: Right;   TEE WITHOUT CARDIOVERSION N/A 04/12/2015   Procedure: TRANSESOPHAGEAL ECHOCARDIOGRAM (TEE)/BUBBLE STUDY;  Surgeon: Gordy Bergamo, MD;  Location: [facility];  Service: Cardiovascular;  Laterality: N/A;   TOTAL HIP REVISION  04/14/2012   Procedure: TOTAL HIP REVISION;  Surgeon: Dempsey JINNY Sensor, MD;  Location: [facility];  Service: Orthopedics;  Laterality: Right;  right total hip revision   TOTAL HIP REVISION Left 11/30/2013   Procedure: TOTAL HIP REVISION;  Surgeon: Dempsey JINNY Sensor, MD;  Location: [facility];  Service: Orthopedics;  Laterality: Left;   TOTAL KNEE ARTHROPLASTY Right 12/13/2020   Procedure: TOTAL KNEE ARTHROPLASTY;  Surgeon: Ernie Cough, MD;  Location: [facility];  Service: Orthopedics;  Laterality: Right;  70 mins    OB History   No obstetric history on file.      Home Medications    Prior to Admission medications   Medication Sig Start Date End Date Taking? Authorizing Provider  acetaminophen  (TYLENOL ) 325 MG tablet Take 650 mg by mouth every 6 (six) hours as needed for headache.   Yes [provider]  Cholecalciferol  (DIALYVITE VITAMIN D  5000) 125 MCG (5000 UT) capsule Take 5,000 Units by mouth daily.   Yes [provider]  nebivolol  (BYSTOLIC ) 5 MG tablet TAKE 1 TABLET DAILY 01/22/23  Yes Jarold Medici, MD  pregabalin  (LYRICA ) 75 MG capsule Take 1 capsule (75 mg total) by mouth daily. TAKE 1 CAPSULE(75 MG) BY MOUTH DAILY 05/29/23  Yes Jarold Medici, MD  rosuvastatin  (CRESTOR ) 10 MG tablet TAKE 1 TABLET DAILY 05/08/23  Yes Jarold Medici, MD  traZODone  (DESYREL ) 50 MG tablet TAKE 1 TABLET(50 MG) BY MOUTH AT BEDTIME 10/30/23  Yes Jarold Medici, MD   triamterene -hydrochlorothiazide  (MAXZIDE -25) 37.5-25 MG tablet TAKE 1 TABLET DAILY 05/08/23  Yes Jarold Medici, MD  zolpidem  (AMBIEN ) 5 MG tablet Take 1 tablet (5 mg total) by mouth at bedtime as needed. for sleep 09/04/22  Yes Jarold Medici, MD  lidocaine  (LIDODERM ) 5 % Place 1 patch onto the skin daily. Remove & Discard patch within 12 hours or as directed by MD 03/02/23   Blue, Soijett A, PA-C  methocarbamol  (ROBAXIN ) 500 MG tablet Take 1 tablet (500 mg total) by mouth 2 (two) times daily as needed for muscle spasms. 04/19/23   Jarold Medici, MD  ofloxacin (OCUFLOX) 0.3 % ophthalmic solution INSTILL  1 DROP IN RIGHT EYE FOUR TIMES DAILY. START 1 DAY BEFORE SURGERY Patient not taking: Reported on 11/22/2022    [provider]  ondansetron  (ZOFRAN -ODT) 4 MG disintegrating tablet Take 1 tablet (4 mg total) by mouth every 8 (eight) hours as needed for nausea or vomiting. 02/08/22   Jarold Medici, MD  prednisoLONE acetate (PRED FORTE) 1 % ophthalmic suspension SHAKE LIQUID AND INSTILL 1 DROP IN RIGHT EYE FOUR TIMES DAILY AFTER SURGERY Patient not taking: Reported on 11/22/2022    [provider]  SYSTANE 0.4-0.3 % GEL ophthalmic gel SMARTSIG:1 Drop(s) In Eye(s) PRN Patient not taking: Reported on 11/22/2022 12/13/21   [provider]  traMADol  (ULTRAM ) 50 MG tablet Take 1 tablet (50 mg total) by mouth 2 (two) times daily as needed. Patient not taking: Reported on 08/06/2023 03/06/23   Jarold Medici, MD  triamcinolone  ointment (KENALOG ) 0.1 %     [provider]    Family History Family History  Problem Relation Age of Onset   Heart attack Mother    Alzheimer's disease Mother    Diabetes Sister    Hypertension Sister     Social History Social History   Tobacco Use   Smoking status: Former    Current packs/day: 0.00    Average packs/day: 0.3 packs/day for 42.0 years (10.5 ttl pk-yrs)    Types: Cigarettes    Start date: 10/22/1976    Quit date: 10/22/2018     Years since quitting: 5.1   Smokeless tobacco: Never  Vaping Use   Vaping status: Never Used  Substance Use Topics   Alcohol use: Not Currently    Comment: lst dirnk 2 years ago    Drug use: No     Allergies   Aspirin , Ibuprofen, Quinapril hcl, Penicillin g, Quinapril, Penicillins, and Pravastatin   Review of Systems Review of Systems  Per HPI   Physical Exam Triage Vital Signs ED Triage Vitals  Encounter Vitals Group     BP 11/29/23 1647 110/65     Systolic BP Percentile --      Diastolic BP Percentile --      Pulse Rate 11/29/23 1647 61     Resp 11/29/23 1647 18     Temp 11/29/23 1647 97.8 F (36.6 C)     Temp Source 11/29/23 1647 Oral     SpO2 11/29/23 1647 96 %     Weight --      Height --      Head Circumference --      Peak Flow --      Pain Score 11/29/23 1644 7     Pain Loc --      Pain Education --      Exclude from Growth Chart --    No data found.  Updated Vital Signs BP 110/65 (BP Location: Right Arm)   Pulse 61   Temp 97.8 F (36.6 C) (Oral)   Resp 18   SpO2 96%   Visual Acuity Right Eye Distance:   Left Eye Distance:   Bilateral Distance:    Right Eye Near:   Left Eye Near:    Bilateral Near:     Physical Exam Vitals and nursing note reviewed.  Constitutional:      Appearance: Normal appearance.  HENT:     Head: Normocephalic and atraumatic.     Right Ear: External ear normal.     Left Ear: External ear normal.     Nose: Nose normal.  Mouth/Throat:     Mouth: Mucous membranes are moist.  Eyes:     Conjunctiva/sclera: Conjunctivae normal.  Cardiovascular:     Rate and Rhythm: Normal rate.  Pulmonary:     Effort: Pulmonary effort is normal. No respiratory distress.  Musculoskeletal:        General: Swelling, tenderness and signs of injury present. No deformity. Normal range of motion.     Right wrist: Tenderness and bony tenderness present. No swelling, deformity or crepitus. Normal range of motion. Normal pulse.      Comments: Right ulnar wrist TTP, no swelling or deformity. Full ROM w/o pain.   Left knee scabbing / abrasion with effusion.  Skin:    General: Skin is warm and dry.  Neurological:     General: No focal deficit present.     Mental Status: She is alert and oriented to person, place, and time.  Psychiatric:        Mood and Affect: Mood normal.        Behavior: Behavior normal. Behavior is cooperative.      UC Treatments / Results  Labs (all labs ordered are listed, but only abnormal results are displayed) Labs Reviewed - No data to display  EKG   Radiology No results found.  Procedures Procedures (including critical care time)  Medications Ordered in UC Medications  acetaminophen  (TYLENOL ) tablet 650 mg (has no administration in time range)    Initial Impression / Assessment and Plan / UC Course  I have reviewed the triage vital signs and the nursing notes.  Pertinent labs & imaging results that were available during my care of the patient were reviewed by me and considered in my medical decision making (see chart for details).  Vitals and triage reviewed, patient is hemodynamically stable.  Right wrist with some mild tenderness over the distal ulnar aspect, no obvious deformity or pain with range of motion.  No swelling.  Imaging does not reveal any obvious fractures, awaiting official radiology overread.  Left knee with abrasion and effusion, range of motion intact with discomfort and tenderness to anterior knee palpation.  Imaging does not show any acute fractures, awaiting official radiology overread.  Knee sleeve provided as well as wrist brace.  Pain management discussed.  Orthopedic follow-up if symptoms persist.  Plan of care, follow-up care return precautions given, no questions at this time.  Radiology interpretation shows osteoarthritis of both x-rays, no acute bony abnormalities.     Final Clinical Impressions(s) / UC Diagnoses   Final diagnoses:  Right wrist  pain  Acute pain of left knee  Effusion of left knee     Discharge Instructions      I do not see any obvious fractures or bony deformities on your images.  These will be officially reviewed by the radiologist over the next few hours and I will call you via telephone if there is any changes in the interpretation.  Please wear the wrist brace and the knee sleeve to help provide compression.  You can take 500 mg of Tylenol  every 6-8 hours for pain.  Icing and elevating can help as well.  Follow-up with your orthopedic if your symptoms persist over the next week or so.       ED Prescriptions   None    PDMP not reviewed this encounter.   Dreama Criss SAILOR, FNP 11/29/23 1753    Dreama, Wardell Pokorski  N, FNP 11/29/23 8180

## 2023-11-29 NOTE — ED Triage Notes (Signed)
 Pt states that she fell last night and her left knee is swollen and hurting. She tried to catch herself with her right hand now she is having pain in that wrist and hand. She does states she doesn't remembering hitting her head but she has a horrible headache. She hs been taking tylenol  without any relief.

## 2023-11-29 NOTE — Discharge Instructions (Signed)
 I do not see any obvious fractures or bony deformities on your images.  These will be officially reviewed by the radiologist over the next few hours and I will call you via telephone if there is any changes in the interpretation.  Please wear the wrist brace and the knee sleeve to help provide compression.  You can take 500 mg of Tylenol  every 6-8 hours for pain.  Icing and elevating can help as well.  Follow-up with your orthopedic if your symptoms persist over the next week or so.

## 2023-11-29 NOTE — Telephone Encounter (Signed)
 This RN called patient with the preferred number in chart. Name and date of birth verified. Pt made an appointment here at [facility] today. In patient comments, pt states she is having left knee pain and right wrist pain after she fell last night, 2/6. This RN wanted to follow-up with patient, letting her know we do not have radiology available today. Pt verbalized understanding. No further questions/concerns at this time.

## 2023-12-04 ENCOUNTER — Ambulatory Visit (INDEPENDENT_AMBULATORY_CARE_PROVIDER_SITE_OTHER): Payer: Medicare Other | Admitting: Orthopedic Surgery

## 2023-12-04 DIAGNOSIS — M1712 Unilateral primary osteoarthritis, left knee: Secondary | ICD-10-CM

## 2023-12-04 MED ORDER — CELECOXIB 100 MG PO CAPS: 100.0000 mg | ORAL_CAPSULE | Freq: Two times a day (BID) | ORAL | 0 refills | Status: DC

## 2023-12-04 MED ORDER — TRAMADOL HCL 50 MG PO TABS: 50.0000 mg | ORAL_TABLET | Freq: Four times a day (QID) | ORAL | 0 refills | Status: AC | PRN

## 2023-12-04 NOTE — Progress Notes (Signed)
Orthopedic Surgery Progress Note   Assessment: Patient is a 73 y.o. female with 2 issues:  1) right wrist pain, possible TFCC injury 2) left knee pain, suspect OA exacerbation   Plan: -For the left knee pain, discussed intra-articular injection as a treatment option.  Patient elected to proceed.  See procedure note below. -For her right wrist, recommended immobilization.  She already has a wrist splint so told her to use that regularly.  If she is doing better at her next visit, we will get her into OT -Prescribed tramadol and Celebrex for pain relief.  Can use Tylenol as well -Return to office in 3-4 weeks, x-rays at next visit: left knee AP and lateral  Left knee injection note: After discussing the risk, benefits, and alternatives of left knee intra-articular steroid injection, patient elected to proceed.  Patient was in the seated position with the knee at 90 degrees.  The anterior lateral soft spot over the knee was prepped with an alcohol based prep.  Ethyl chloride was used to anesthetize the area.  A 20-gauge needle was used to inject 1 cc of lidocaine, 1 cc of bupivacaine, 1 cc of Depo-Medrol under standard sterile technique.  Needle was withdrawn and Band-Aid was applied.  Patient tolerated the procedure well.  ___________________________________________________________________________  History: Patient is a 73 year old female who tripped and had a fall on 11/28/2023.  She noted acute onset of left knee and right wrist pain.  She feels the right wrist pain over the ulnar aspect of the wrist.  In the knee she notices pain throughout the joint but the worst of it is on the lateral aspect of the knee.  She did not notice any swelling around the knee but she did see some abrasions over the anterior aspect of the patella.  She has been weightbearing with a cane.  Her pain is similar to when she fell.  She had tried Robaxin but that was not helpful.   Physical Exam:  General: no acute  distress, appears stated age Neurologic: alert, answering questions appropriately, following commands Respiratory: unlabored breathing on room air, symmetric chest rise Psychiatric: appropriate affect, normal cadence to speech  MSK:   -Right upper extremity  TTP over the ulnar styloid and the ulnar aspect of the wrist.  No other tenderness to palpation over the remainder of the extremity.  No gross deformity.  No open wounds. Fires deltoid, biceps, triceps, wrist extensors, wrist flexors, finger extensors, finger flexors  AIN/PIN/IO intact  Palpable radial pulse  Sensation intact to light touch in median/ulnar/radial/axillary nerve distributions  Hand warm and well perfused  -Left lower extremity  TTP over the medial and lateral joint lines at the knee.  More tender on the lateral aspect of the knee.  Small abrasion seen over the anterior aspect of the knee.  No deep wounds.  No gross deformity.  Minimal effusion of the knee.  Patient with significant guarding during ligamentous exam but no instability noted with varus or valgus testing.  Negative Lachman.  Negative posterior drawer.  No pain with logroll Fires hip flexors, quadriceps, hamstrings, tibialis anterior, gastrocnemius and soleus, extensor hallucis longus Plantarflexes and dorsiflexes toes Sensation intact to light touch in sural, saphenous, tibial, deep peroneal, and superficial peroneal nerve distributions Foot warm and well perfused  Imaging: XRs of the right wrist from 11/29/2023 were independently reviewed and interpreted, showing no fracture or dislocation.  Mild CMC arthritis.  No other significant degenerative changes.  XRs of the left knee from 11/29/2023  were independent reviewed and interpreted, showing no fracture or dislocation.  Bicompartmental degenerative changes seen with joint space narrowing. Osteophyte formation seen in the lateral compartment.    Patient name: [name] Patient MRN: [id number] Date:  12/04/23

## 2023-12-11 ENCOUNTER — Other Ambulatory Visit: Payer: Self-pay | Admitting: Internal Medicine

## 2023-12-12 ENCOUNTER — Other Ambulatory Visit: Payer: Self-pay | Admitting: Internal Medicine

## 2023-12-20 ENCOUNTER — Other Ambulatory Visit (INDEPENDENT_AMBULATORY_CARE_PROVIDER_SITE_OTHER): Payer: Self-pay

## 2023-12-20 ENCOUNTER — Ambulatory Visit (INDEPENDENT_AMBULATORY_CARE_PROVIDER_SITE_OTHER): Payer: Medicare Other | Admitting: Orthopedic Surgery

## 2023-12-20 DIAGNOSIS — M545 Low back pain, unspecified: Secondary | ICD-10-CM | POA: Diagnosis not present

## 2023-12-20 MED ORDER — CYCLOBENZAPRINE HCL 10 MG PO TABS: 10.0000 mg | ORAL_TABLET | Freq: Three times a day (TID) | ORAL | 0 refills | Status: DC | PRN

## 2023-12-20 MED ORDER — TRAMADOL HCL 50 MG PO TABS: 50.0000 mg | ORAL_TABLET | Freq: Four times a day (QID) | ORAL | 0 refills | Status: AC | PRN

## 2023-12-20 NOTE — Progress Notes (Signed)
 Orthopedic Spine Surgery Office Note  Assessment: Patient is a 73 y.o. female with acute onset of low back pain with muscle spasms.  Continued ulnar-sided right wrist pain, possible TFCC injury   Plan: -Patient has a chronic history of low back pain and it comes and goes in terms of intensity.  Refer her to pain management for the chronic pain -Prescribed tramadol and Flexeril to help with the pain -For her wrist, I told her to keep using the brace and icing the area -Will start PT as an additional treatment, referral provided to her today -Patient should return to office in 4 weeks, x-rays at next visit: none   Patient expressed understanding of the plan and all questions were answered to the patient's satisfaction.   ___________________________________________________________________________  History: Patient is a 73 y.o. female who has been previously seen in the office for right wrist and left knee pain.  She said that her knee pain has gotten significantly better since getting the injection.  She is still having ulnar-sided wrist pain.  She has not noticed any significant changes since she was last seen.  She has been using the brace regularly.  However, what is troubling her more is her back.  She said she has a long history of back pain but it has gotten acutely worse.  She feels it in the lower lumbar region particularly on the right side in the area of the paraspinal muscles.  She says that it feels like the muscles are spasming.  She says at times the muscle spasms can be severe and caused her to stop what she is doing.  She does not have any pain radiating to either lower extremity.  No bowel or bladder incontinence.  No saddle anesthesia.  Previous treatments: Tylenol, heat, tramadol   Physical Exam:  General: no acute distress, appears stated age Neurologic: alert, answering questions appropriately, following commands Respiratory: unlabored breathing on room air, symmetric  chest rise Psychiatric: appropriate affect, normal cadence to speech   MSK (spine):  -Strength exam      Left  Right EHL    5/5  5/5 TA    5/5  5/5 GSC    5/5  5/5 Knee extension  5/5  5/5 Hip flexion   5/5  5/5  -Sensory exam    Sensation intact to light touch in L3-S1 nerve distributions of bilateral lower extremities  -Achilles DTR: 1/4 on the left, 1/4 on the right -Patellar tendon DTR: 1/4 on the left, 1/4 on the right  -Straight leg raise: Negative bilaterally -Clonus: no beats bilaterally  -Right wrist exam: TTP over the ulnar styloid and ulnar aspect of the distal forearm.  No other tenderness palpation over the remainder of the wrist.  AIN/PIN/IO intact.  No open wounds.  No swelling seen.  Sensation intact to light touch in median/radial/ulnar nerve distributions.  Hand warm well-perfused.  Palpable radial pulse.  Imaging: XRs of the right wrist from 11/29/2023 were independently reviewed and interpreted, showing no fracture or dislocation. Mild CMC arthritis. No other significant degenerative changes.   XRs of the lumbar spine from 12/20/2023 were independently reviewed and interpreted, showing lumbar scoliosis with apex to the left at L3.  Disc height loss at multiple levels in the lumbar spine.  Facet arthrosis seen in the lower lumbar spine.  No fracture or dislocation seen.  No evidence of instability on flexion/extension views.   Patient name: [name] Patient MRN: [id number] Date of visit: 12/20/23

## 2023-12-25 ENCOUNTER — Ambulatory Visit: Payer: Medicare Other | Admitting: Orthopedic Surgery

## 2023-12-31 ENCOUNTER — Other Ambulatory Visit: Payer: Self-pay | Admitting: Orthopedic Surgery

## 2023-12-31 ENCOUNTER — Other Ambulatory Visit: Payer: Self-pay | Admitting: Internal Medicine

## 2024-01-06 ENCOUNTER — Encounter: Payer: Self-pay | Admitting: Physical Therapy

## 2024-01-06 ENCOUNTER — Other Ambulatory Visit: Payer: Self-pay | Admitting: Radiology

## 2024-01-06 ENCOUNTER — Ambulatory Visit (INDEPENDENT_AMBULATORY_CARE_PROVIDER_SITE_OTHER): Payer: Medicare Other | Admitting: Physical Therapy

## 2024-01-06 DIAGNOSIS — M5459 Other low back pain: Secondary | ICD-10-CM | POA: Diagnosis not present

## 2024-01-06 DIAGNOSIS — Z9181 History of falling: Secondary | ICD-10-CM | POA: Diagnosis not present

## 2024-01-06 DIAGNOSIS — R2689 Other abnormalities of gait and mobility: Secondary | ICD-10-CM

## 2024-01-06 DIAGNOSIS — M6281 Muscle weakness (generalized): Secondary | ICD-10-CM

## 2024-01-06 DIAGNOSIS — M25531 Pain in right wrist: Secondary | ICD-10-CM

## 2024-01-06 NOTE — Addendum Note (Signed)
 Addended by: Moshe Cipro F on: 01/06/2024 11:30 AM   Modules accepted: Orders

## 2024-01-06 NOTE — Therapy (Addendum)
 OUTPATIENT PHYSICAL THERAPY EVALUATION   Patient Name: [name] MRN: [id number] DOB:[birth year], 73 y.o., female Today's Date: 01/06/2024  END OF SESSION:  PT End of Session - 01/06/24 0936     Visit Number 1    Number of Visits 12    Date for PT Re-Evaluation 02/17/24    Authorization Type Medicare/Tricare    Progress Note Due on Visit 10    PT Start Time 0932    PT Stop Time 1010    PT Time Calculation (min) 38 min    Activity Tolerance Patient tolerated treatment well    Behavior During Therapy [facility] for tasks assessed/performed             Past Medical History:  Diagnosis Date   Aortic atherosclerosis (HCC) 04/16/2021   Arthritis    Cataract    Diverticulosis    H/O blood transfusion reaction 2010   hives   H/O migraine    last 42yrs ago   Headache(784.0)    Heart murmur    takes Bystolic nightly   History of blood clots 55yrs   was on Lovenox injections and Coumadin(was only on that for short period of time)   History of shingles 3-49yrs ago   Hx of seasonal allergies    takes Levocetirizine prn   Hyperlipidemia    takes Crestor nightly   Hypertension    takes Maxzide daily   Insomnia    takes Ambien prn   Joint pain    Joint swelling    Moderate tricuspid regurgitation    Pre-diabetes    Trigeminal neuralgia of right side of face 03/26/2016   V2 distribution   Past Surgical History:  Procedure Laterality Date   ABDOMINAL HYSTERECTOMY     BREAST BIOPSY  05/03/2006   US   BREAST REDUCTION SURGERY  1987   BUNIONECTOMY Right 05/11/2019   CATARACT EXTRACTION Left 07/2019   CHOLECYSTECTOMY     COLONOSCOPY     ESOPHAGOGASTRODUODENOSCOPY     EYE SURGERY Right    eye lash removed   JOINT REPLACEMENT     bilateral hip rt in 2007 and lt in 2010   NM MYOCAR PERF WALL MOTION  11/04/2008   Normal   REDUCTION MAMMAPLASTY Bilateral 1988   REDUCTION MAMMAPLASTY     SHOULDER ARTHROSCOPY WITH DISTAL CLAVICLE RESECTION Right 09/21/2015   Procedure: RIGHT  SHOULDER ARTHROSCOPY WITH DISTAL CLAVICLE EXCISION DEBRIDE LABRAL TEAR ACROMIOPLASTY ;  Surgeon: Gean Birchwood, MD;  Location: [facility];  Service: Orthopedics;  Laterality: Right;   TEE WITHOUT CARDIOVERSION N/A 04/12/2015   Procedure: TRANSESOPHAGEAL ECHOCARDIOGRAM (TEE)/BUBBLE STUDY;  Surgeon: Yates Decamp, MD;  Location: [facility] ENDOSCOPY;  Service: Cardiovascular;  Laterality: N/A;   TOTAL HIP REVISION  04/14/2012   Procedure: TOTAL HIP REVISION;  Surgeon: [name], MD;  Location: [facility] OR;  Service: Orthopedics;  Laterality: Right;  right total hip revision   TOTAL HIP REVISION Left 11/30/2013   Procedure: TOTAL HIP REVISION;  Surgeon: [name], MD;  Location: [facility] OR;  Service: Orthopedics;  Laterality: Left;   TOTAL KNEE ARTHROPLASTY Right 12/13/2020   Procedure: TOTAL KNEE ARTHROPLASTY;  Surgeon: Durene Romans, MD;  Location: [facility] ORS;  Service: Orthopedics;  Laterality: Right;  70 mins   Patient Active Problem List   Diagnosis Date Noted   Chills (without fever) 11/17/2023   Numbness of finger 11/17/2023   Other fatigue 11/17/2023   Hypertensive heart disease with chronic diastolic congestive heart failure (HCC) 11/17/2023  Left leg swelling 08/06/2023   Adjustment disorder with depressed mood 11/22/2022   Coronary artery disease involving native coronary artery of native heart without angina pectoris 08/14/2022   Tension headache 08/14/2022   Arthralgia of left ankle 08/14/2022   Vitamin D deficiency 08/14/2022   Hypertensive heart disease without heart failure 08/14/2022   Contusion of right knee 10/08/2021   Aortic atherosclerosis (HCC) 04/16/2021   Chest pain 04/15/2021   Heart failure due to valvular disease, unspecified heart failure type (HCC) 02/22/2021   Obese 12/14/2020   S/P total knee arthroplasty, 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   Moderate tricuspid regurgitation 10/20/2019   Bunion of great toe of right foot 01/12/2019   Cellulitis 12/23/2018   Foot infection    Great  toe pain, right 12/17/2018   Prediabetes 12/17/2018   Cellulitis of foot 08/23/2017   Trigeminal neuralgia of right side of face 03/26/2016   Hyperlipidemia 12/04/2013   Benign essential HTN 12/04/2013   Constipation 12/04/2013   Chronic insomnia 12/04/2013   Acute blood loss anemia 12/04/2013   Left hip pain 12/04/2013   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   Pain due to total hip replacement (HCC) 04/16/2012    PCP: Dorothyann Peng, MD  REFERRING PROVIDER: London Sheer, MD  REFERRING DIAG: M54.50 (ICD-10-CM) - Low back pain, unspecified back pain laterality, unspecified chronicity, unspecified whether sciatica present  Rationale for Evaluation and Treatment: Rehabilitation  THERAPY DIAG:  Other low back pain - Plan: PT plan of care cert/re-cert  Muscle weakness (generalized) - Plan: PT plan of care cert/re-cert  Other abnormalities of gait and mobility - Plan: PT plan of care cert/re-cert  History of falling - Plan: PT plan of care cert/re-cert  ONSET DATE: 3 weeks ago   SUBJECTIVE:                                                                                                                                                                                           SUBJECTIVE STATEMENT: Pt reporting Rt sided back pain and spasms which started about 2-3 weeks ago.  She feels like her pain is getting worse at this time.  She reports she fell in early Feb and feels this may have contributed to the pain.  PERTINENT HISTORY:  Aortic atherosclerosis, OA, migraines, hx shingles, HTN, CHF, CAD, THA  PAIN:  Are you having pain? Yes: NPRS scale: 5 currently; up to 9, at best 5/10 Pain location: Rt low back, recently radiating into hip and buttock Pain description: spasm, aching, burning, throbbing Aggravating factors: standing up, walking up stairs Relieving factors: lidocaine patch, muscle relaxers  PRECAUTIONS:  Fall  RED FLAGS: None   WEIGHT BEARING RESTRICTIONS:   No  FALLS:  Has patient fallen in last 6 months? Yes. Number of falls 1  LIVING ENVIRONMENT: Lives with: lives with their spouse Lives in: House/apartment Stairs: Yes: Internal: 14 steps; on left going up and External: 4 steps; on left going up Has following equipment at home: Single point cane and Walker - 2 wheeled  OCCUPATION:  Retired from travel industry  PLOF:  Independent and Leisure: travel  PATIENT GOALS:  Improve pain    OBJECTIVE:  Note: Objective measures were completed at Evaluation unless otherwise noted.  DIAGNOSTIC FINDINGS:  X-rays: lumbar scoliosis with apex to the left at L3. Disc height loss at multiple levels in the lumbar spine. Facet arthrosis seen in the lower lumbar spine  PATIENT SURVEYS:  Patient-Specific Activity Scoring Scheme  "0" represents "unable to perform." "10" represents "able to perform at prior level. 0 1 2 3 4 5 6 7 8 9  10 (Date and Score)   Activity Eval     1. Standing up 5    2. Going up stairs 3    3. Bending over 3   Score 3.67    Total score = sum of the activity scores/number of activities Minimum detectable change (90%CI) for average score = 2 points Minimum detectable change (90%CI) for single activity score = 3 points    COGNITIVE STATUS: Within functional limits for tasks assessed   SENSATION: WFL  POSTURE:  rounded shoulders, forward head, and increased lumbar lordosis   GAIT: 01/06/24 Distance walked: 100' within clinc Assistive device utilized: Single point cane Level of assistance: Modified independence Comments: antalgic gait    PALPATION: 01/06/24 tender to touch Rt QL and into glutes and piriformis; Lt knee swelling noted    LUMBAR ROM:   ROM A/PROM  eval  Flexion Limited 50%  Extension WNL with Rt side pain  Right quadrant Limited 25%  Left quadrant WNL with Rt side pain   (Blank rows = not tested)   LOWER EXTREMITY MMT:    01/06/24: give way weakness noted on Lt likely due to  pain from Lt knee injury  MMT Right eval Left eval  Hip flexion 4/5 3/5  Hip extension 2-/5 3/5  Hip abduction 2/5 3/5  Hip adduction    Hip internal rotation    Hip external rotation    Knee flexion 5/5 4/5  Knee extension 4/5 3/5  Ankle dorsiflexion 5/5 4/5  Ankle plantarflexion    Ankle inversion    Ankle eversion     (Blank rows = not tested)     TREATMENT:                                                                                                                              DATE:  01/06/24 See HEP - demonstrated with trial reps performed PRN, min cues for comprehension        PATIENT EDUCATION:  Education details: HEP, POC Person educated: Patient Education method: Explanation, Demonstration, and Handouts Education comprehension: verbalized understanding, returned demonstration, and needs further education  HOME EXERCISE PROGRAM: Access Code: [email or web address] URL: [email or web address] Date: 01/06/2024 Prepared by: Moshe Cipro  Exercises - Hooklying Single Knee to Chest  - 2 x daily - 7 x weekly - 1 sets - 3 reps - 30 sec hold - Supine Piriformis Stretch with Foot on Ground  - 2 x daily - 7 x weekly - 1 sets - 3 reps - 30 sec hold - Supine Lower Trunk Rotation  - 2 x daily - 7 x weekly - 1 sets - 3 reps - 15 sec hold - Self Release   - 2-3 x daily - 7 x weekly - 1 sets - 1 reps - 3-5 min hold   ASSESSMENT:  CLINICAL IMPRESSION: Patient is a 73 y.o. female who was seen today for physical therapy evaluation and treatment for LBP. She demonstrates decreased ROM and strength, as well as gait abnormalities and decreased balance affecting functional mobility.  She will benefit from PT to address deficits listed.     OBJECTIVE IMPAIRMENTS: Abnormal gait, decreased activity tolerance, decreased balance, decreased knowledge of use of DME, decreased mobility, difficulty walking, decreased ROM, decreased strength, increased fascial restrictions,  increased muscle spasms, postural dysfunction, and pain.   ACTIVITY LIMITATIONS: carrying, lifting, bending, standing, squatting, sleeping, stairs, transfers, and locomotion level  PARTICIPATION LIMITATIONS: meal prep, cleaning, laundry, driving, shopping, community activity, occupation, and yard work  PERSONAL FACTORS: 3+ comorbidities: Aortic atherosclerosis, OA, migraines, hx shingles, HTN, CHF, CAD, THA  are also affecting patient's functional outcome.   REHAB POTENTIAL: Good  CLINICAL DECISION MAKING: Evolving/moderate complexity  EVALUATION COMPLEXITY: Moderate   GOALS: Goals reviewed with patient? Yes  SHORT TERM GOALS: Target date: 01/27/2024  Independent with initial HEP Goal status: INITIAL   LONG TERM GOALS: Target date: 02/17/2024   Independent with final HEP Goal status: INITIAL  2.  PSFS score improved by 3 points Goal status: INITIAL  3.  Lumbar ROM improved to [facility] without increase in pain for improved mobility and function Goal status: INIITAL  4.  Report pain < 3/10 with stairs and transfers for improved function Goal status: INITIAL  5.  Demonstrate at least 4/5 bil hip and knee strength for improved stair navigation Goal status: INITIAL    PLAN:  PT FREQUENCY: 1-2x/week  PT DURATION: 6 weeks  PLANNED INTERVENTIONS: 97164- PT Re-evaluation, 97110-Therapeutic exercises, 97530- Therapeutic activity, 97112- Neuromuscular re-education, 97535- Self Care, [id number]- Manual therapy, [id number]- Gait training, [id number]- Aquatic Therapy, [phone]- Electrical stimulation (unattended), 97035- Ultrasound, 43329- Traction (mechanical), Patient/Family education, Balance training, Taping, Dry Needling, Joint mobilization, Joint manipulation, Spinal manipulation, Spinal mobilization, Cryotherapy, and Moist heat.  PLAN FOR NEXT SESSION: review HEP, be aware of Lt knee and Rt wrist pain (OT order requested); pain management strategies until able to tolerate strengthening   NEXT  MD VISIT: 01/16/24   Clarita Crane, PT, DPT 01/06/24 11:30 AM

## 2024-01-16 ENCOUNTER — Ambulatory Visit (INDEPENDENT_AMBULATORY_CARE_PROVIDER_SITE_OTHER): Payer: Medicare Other | Admitting: Orthopedic Surgery

## 2024-01-16 DIAGNOSIS — M545 Low back pain, unspecified: Secondary | ICD-10-CM | POA: Diagnosis not present

## 2024-01-16 NOTE — Progress Notes (Signed)
 Orthopedic Spine Surgery Office Note   Assessment: Patient is a 73 y.o. female with acute onset of low back pain with muscle spasms.  Continued ulnar-sided right wrist pain, possible TFCC injury.  Both have improved with conservative treatment     Plan: -Her wrist pain has gotten notably better since she was last seen so she can discontinue the brace.  If pain does return, then I would tell her to go back into the brace -For her low back, she has noticed significant improvement in her pain since she was last in the office.  She feels that the tramadol and Flexeril have been helpful.  I told her now that pain is getting better, she should discontinue the tramadol.  We can always restart that in the future.  I do not want her to get addicted or develop a tolerance for the opioids.  She can use heat, Tylenol, and Flexeril still -Patient should return to office on an as-needed basis     Patient expressed understanding of the plan and all questions were answered to the patient's satisfaction.    ___________________________________________________________________________   History: Patient is a 73 y.o. female who has been previously seen in the office for right wrist and low back pain.  She said that her wrist has gotten significantly better.  She has been able to use it more.  She is not using the brace as much.  In regards to her back, she said that the spasms have improved as well.  She said the pain is currently tolerable with Flexeril and Tylenol.  She has not been using the tramadol recently.  She felt that the tramadol was helpful when she was in her flare of pain.  She has no pain radiating into either lower extremity.   Previous treatments: PT, Tylenol, heat, flexeril, tramadol     Physical Exam:   General: no acute distress, appears stated age Neurologic: alert, answering questions appropriately, following commands Respiratory: unlabored breathing on room air, symmetric chest  rise Psychiatric: appropriate affect, normal cadence to speech     MSK (spine):   -Strength exam                                                   Left                  Right EHL                              5/5                  5/5 TA                                 5/5                  5/5 GSC                             5/5                  5/5 Knee extension            5/5  5/5 Hip flexion                    5/5                  5/5   -Sensory exam                           Sensation intact to light touch in L3-S1 nerve distributions of bilateral lower extremities   -Right wrist exam: TTP over the ulnar styloid and ulnar aspect of the distal forearm - less so than last visit.  No other tenderness palpation over the remainder of the wrist.  AIN/PIN/IO intact.  No open wounds.  No swelling seen.  Sensation intact to light touch in median/radial/ulnar nerve distributions.  Hand warm well-perfused.  Palpable radial pulse.   Imaging: XRs of the right wrist from 11/29/2023 were previously independently reviewed and interpreted, showing no fracture or dislocation. Mild CMC arthritis. No other significant degenerative changes.    XRs of the lumbar spine from 12/20/2023 were previously independently reviewed and interpreted, showing lumbar scoliosis with apex to the left at L3.  Disc height loss at multiple levels in the lumbar spine.  Facet arthrosis seen in the lower lumbar spine.  No fracture or dislocation seen.  No evidence of instability on flexion/extension views.     Patient name: [name] Patient MRN: [id number] Date of visit: 01/16/24

## 2024-01-21 ENCOUNTER — Other Ambulatory Visit: Payer: Self-pay | Admitting: Internal Medicine

## 2024-01-21 ENCOUNTER — Encounter: Admitting: Rehabilitative and Restorative Service Providers"

## 2024-01-23 DIAGNOSIS — L308 Other specified dermatitis: Secondary | ICD-10-CM | POA: Diagnosis not present

## 2024-02-26 ENCOUNTER — Ambulatory Visit: Payer: Self-pay | Admitting: Internal Medicine

## 2024-02-27 ENCOUNTER — Encounter: Payer: Self-pay | Admitting: Internal Medicine

## 2024-02-27 ENCOUNTER — Ambulatory Visit (INDEPENDENT_AMBULATORY_CARE_PROVIDER_SITE_OTHER): Payer: Self-pay | Admitting: Internal Medicine

## 2024-02-27 VITALS — BP 110/82 | HR 69 | Temp 97.8°F | Ht 60.0 in | Wt 173.8 lb

## 2024-02-27 DIAGNOSIS — F5104 Psychophysiologic insomnia: Secondary | ICD-10-CM | POA: Diagnosis not present

## 2024-02-27 DIAGNOSIS — Z6833 Body mass index (BMI) 33.0-33.9, adult: Secondary | ICD-10-CM | POA: Diagnosis not present

## 2024-02-27 DIAGNOSIS — M7989 Other specified soft tissue disorders: Secondary | ICD-10-CM

## 2024-02-27 DIAGNOSIS — M48061 Spinal stenosis, lumbar region without neurogenic claudication: Secondary | ICD-10-CM

## 2024-02-27 DIAGNOSIS — E7800 Pure hypercholesterolemia, unspecified: Secondary | ICD-10-CM | POA: Diagnosis not present

## 2024-02-27 DIAGNOSIS — I2510 Atherosclerotic heart disease of native coronary artery without angina pectoris: Secondary | ICD-10-CM | POA: Diagnosis not present

## 2024-02-27 DIAGNOSIS — R7309 Other abnormal glucose: Secondary | ICD-10-CM | POA: Diagnosis not present

## 2024-02-27 DIAGNOSIS — E66811 Obesity, class 1: Secondary | ICD-10-CM

## 2024-02-27 DIAGNOSIS — E6609 Other obesity due to excess calories: Secondary | ICD-10-CM | POA: Diagnosis not present

## 2024-02-27 DIAGNOSIS — I5032 Chronic diastolic (congestive) heart failure: Secondary | ICD-10-CM

## 2024-02-27 DIAGNOSIS — I110 Hypertensive heart disease with heart failure: Secondary | ICD-10-CM

## 2024-02-27 MED ORDER — ZOLPIDEM TARTRATE 5 MG PO TABS: 5.0000 mg | ORAL_TABLET | Freq: Every evening | ORAL | 1 refills | Status: DC | PRN

## 2024-02-27 MED ORDER — CYCLOBENZAPRINE HCL 10 MG PO TABS
10.0000 mg | ORAL_TABLET | Freq: Three times a day (TID) | ORAL | 0 refills | Status: AC | PRN
Start: 2024-02-27 — End: ?

## 2024-02-27 MED ORDER — LIDOCAINE 5 % EX PTCH: 1.0000 | MEDICATED_PATCH | CUTANEOUS | 0 refills | Status: AC

## 2024-02-27 NOTE — Progress Notes (Signed)
 I,Victoria T Basil Lim, CMA,acting as a Neurosurgeon for Smiley Dung, MD.,have documented all relevant documentation on the behalf of Smiley Dung, MD,as directed by  Smiley Dung, MD while in the presence of Smiley Dung, MD.  Subjective:  Patient ID: [name] , female    DOB: [birth year] , 73 y.o.   MRN: [id number]  Chief Complaint  Patient presents with   Hypertension    Patient presents today for bp & chol f/u. She reports compliance with medications. Denies headache, chest pain & sob. While here today she would like to discuss weight gain. She has noticed putting on weight.  She also states experiencing back pain. This has been an ongoing issue for a year. She has an orthopedic along with completing PT.    Hyperlipidemia    HPI Discussed the use of AI scribe software for clinical note transcription with the patient, who gave verbal consent to proceed.  History of Present Illness [name] is a 73 year old female with hypertension and degenerative disc disease who presents for blood pressure management and sleep issues.  She experiences sleep disturbances despite taking two tablets of trazodone  at bedtime, with symptoms including nightmares and a lack of restful sleep. She previously found Ambien  more effective but was discontinued from it. No snoring is reported, and her mental health is stable, as she describes herself as 'going with the flow'.  Her hypertension is managed with daily Bystolic  (nebivolol ) and triamterene , which requires a refill. She prefers obtaining medications from [facility] and uses [facility] [employer] [employer] for mail order.  She has degenerative disc disease and scoliosis, diagnosed after a CT scan following an emergency room visit last year. She experiences back pain and uses tramadol  and a muscle relaxant during flare-ups, along with a lidocaine  patch and CBD without THC for pain management.  She engages in physical activity, including chair yoga and  exercises with her granddaughter and daughter. She is interested in weight loss to fit into a swimsuit.  No family history of heart disease, strokes, or diabetes is reported, but there is a family history of cancer and arthritis in older generations. She is not taking Celebrex  due to adverse reactions and has a full bottle remaining.   Hypertension This is a chronic problem. The current episode started more than 1 year ago. The problem has been gradually improving since onset. The problem is controlled. Pertinent negatives include no blurred vision. Risk factors for coronary artery disease include dyslipidemia, post-menopausal state, sedentary lifestyle and stress. Past treatments include beta blockers and diuretics. The current treatment provides moderate improvement.     Past Medical History:  Diagnosis Date   Aortic atherosclerosis (HCC) 04/16/2021   Arthritis    Cataract    Diverticulosis    H/O blood transfusion reaction 2010   hives   H/O migraine    last 46yrs ago   Headache(784.0)    Heart murmur    takes Bystolic  nightly   History of blood clots 80yrs   was on Lovenox  injections and Coumadin(was only on that for short period of time)   History of shingles 3-65yrs ago   Hx of seasonal allergies    takes Levocetirizine prn   Hyperlipidemia    takes Crestor  nightly   Hypertension    takes Maxzide  daily   Insomnia    takes Ambien  prn   Joint pain    Joint swelling    Moderate tricuspid regurgitation    Pre-diabetes  Trigeminal neuralgia of right side of face 03/26/2016   V2 distribution     Family History  Problem Relation Age of Onset   Heart attack Mother    Alzheimer's disease Mother    Diabetes Sister    Hypertension Sister      Current Outpatient Medications:    acetaminophen  (TYLENOL ) 325 MG tablet, Take 650 mg by mouth every 6 (six) hours as needed for headache., Disp: , Rfl:    Cholecalciferol  (DIALYVITE VITAMIN D  5000) 125 MCG (5000 UT) capsule, Take  5,000 Units by mouth daily., Disp: , Rfl:    nebivolol  (BYSTOLIC ) 5 MG tablet, TAKE 1 TABLET DAILY, Disp: 90 tablet, Rfl: 3   ondansetron  (ZOFRAN -ODT) 4 MG disintegrating tablet, Take 1 tablet (4 mg total) by mouth every 8 (eight) hours as needed for nausea or vomiting., Disp: 20 tablet, Rfl: 0   pregabalin  (LYRICA ) 75 MG capsule, TAKE 1 CAPSULE(75 MG) BY MOUTH DAILY, Disp: 90 capsule, Rfl: 1   rosuvastatin  (CRESTOR ) 10 MG tablet, TAKE 1 TABLET DAILY, Disp: 90 tablet, Rfl: 3   triamcinolone  ointment (KENALOG ) 0.1 %, , Disp: , Rfl:    triamterene -hydrochlorothiazide  (MAXZIDE -25) 37.5-25 MG tablet, TAKE 1 TABLET DAILY, Disp: 90 tablet, Rfl: 3   cyclobenzaprine  (FLEXERIL ) 10 MG tablet, Take 1 tablet (10 mg total) by mouth 3 (three) times daily as needed for muscle spasms., Disp: 30 tablet, Rfl: 0   lidocaine  (LIDODERM ) 5 %, Place 1 patch onto the skin daily. Remove & Discard patch within 12 hours or as directed by MD, Disp: 30 patch, Rfl: 0   SYSTANE 0.4-0.3 % GEL ophthalmic gel, SMARTSIG:1 Drop(s) In Eye(s) PRN (Patient not taking: Reported on 02/28/2024), Disp: , Rfl:    zolpidem  (AMBIEN ) 5 MG tablet, Take 1 tablet (5 mg total) by mouth at bedtime as needed. for sleep, Disp: 60 tablet, Rfl: 1   Allergies  Allergen Reactions   Aspirin  Anaphylaxis and Other (See Comments)   Ibuprofen Hives, Swelling and Other (See Comments)   Quinapril Hcl Anaphylaxis   Penicillin G Other (See Comments)   Quinapril Other (See Comments)   Penicillins Hives, Swelling and Other (See Comments)    Has patient had a PCN reaction causing immediate rash, facial/tongue/throat swelling, SOB or lightheadedness with hypotension: Yes  Has patient had a PCN reaction causing severe rash involving mucus membranes or skin necrosis: No  Has patient had a PCN reaction that required hospitalization: No  Has patient had a PCN reaction occurring within the last 10 years: No  If all of the above answers are "NO", then may proceed  with Cephalosporin use.  Tolerated Cephalosporin Date: 12/13/20.   Pravastatin Rash     Review of Systems  Constitutional: Negative.   Eyes:  Negative for blurred vision.  Respiratory: Negative.    Cardiovascular: Negative.   Gastrointestinal: Negative.   Neurological: Negative.   Psychiatric/Behavioral: Negative.       Today's Vitals   02/27/24 1437  BP: 110/82  Pulse: 69  Temp: 97.8 F (36.6 C)  SpO2: 98%  Weight: 173 lb 12.8 oz (78.8 kg)  Height: 5' (1.524 m)   Body mass index is 33.94 kg/m.  Wt Readings from Last 3 Encounters:  02/27/24 173 lb 12.8 oz (78.8 kg)  11/07/23 173 lb 12.8 oz (78.8 kg)  08/06/23 167 lb 12.8 oz (76.1 kg)     Objective:  Physical Exam Vitals and nursing note reviewed.  Constitutional:      Appearance: Normal appearance. She is obese.  HENT:     Head: Normocephalic and atraumatic.  Eyes:     Extraocular Movements: Extraocular movements intact.  Cardiovascular:     Rate and Rhythm: Normal rate and regular rhythm.     Heart sounds: Normal heart sounds.  Pulmonary:     Effort: Pulmonary effort is normal.     Breath sounds: Normal breath sounds.  Musculoskeletal:     Cervical back: Normal range of motion.  Skin:    General: Skin is warm.     Comments: posterior left thigh, above knee, nontender   Neurological:     General: No focal deficit present.     Mental Status: She is alert.  Psychiatric:        Mood and Affect: Mood normal.        Behavior: Behavior normal.      Assessment And Plan:  Hypertensive heart disease with chronic diastolic congestive heart failure (HCC) Assessment & Plan: Chronic, well controlled.  She will continue with nebivolol  5mg  daily and Maxzide  37.5/25mg  daily. Encouraged to follow low sodium diet.   Orders: -     BMP8+EGFR  Coronary artery disease involving native coronary artery of native heart without angina pectoris Assessment & Plan: Coronary calcifications noted on CT scan. She has been  evaluated by Cardiology, LDL goal is less than 70. She will continue with statin therapy.    Pure hypercholesterolemia Assessment & Plan: Chronic, LDL goal is less than 70.  She will continue with rosuvastatin  and encouraged to follow heart healthy lifestyle.   Orders: -     Lipoprotein A (LPA)  Foraminal stenosis of lumbar region Assessment & Plan: Chronic back pain due to degenerative disc disease and scoliosis. Managed with tramadol  and muscle relaxants during flares. Avoid concurrent use of tramadol , Ambien , and muscle relaxants. - Prescribe muscle relaxant for back pain flares. - Instruct her to avoid using tramadol , Ambien , and muscle relaxants concurrently. - Continue with management by Ortho   Chronic insomnia Assessment & Plan: Sleep disturbances managed with trazodone . Reports nightmares and inadequate sleep with current regimen. Prefers Ambien  despite potential memory issues. Informed consent obtained regarding risks of long-term Ambien  use. - Prescribe Ambien  for sleep disturbances. - Instruct her to take two trazodone  tablets at bedtime if continuing trazodone .   Soft tissue mass -     US  LT LOWER EXTREM LTD SOFT TISSUE NON VASCULAR; Future  Other abnormal glucose Assessment & Plan: Her A1C has been elevated in the past.  I will check an A1c, BMP today.  She is encouraged to avoid sugary beverages and processed including breads, rice and pasta.    Orders: -     Hemoglobin A1c  Class 1 obesity due to excess calories with serious comorbidity and body mass index (BMI) of 33.0 to 33.9 in adult Assessment & Plan: Discussed obesity management with potential use of Wegovy. Interested in Paint. Need to verify insurance coverage and assess genetic predisposition to heart disease. Documented coronary artery disease to support potential use of weight loss medication. - Order LP(a) test to assess genetic predisposition to heart disease. - Check for calcifications in coronary  arteries from previous CT scans. - Instruct her to contact Medicare and [employer] to verify coverage for [facility]. - Document coronary artery disease to support insurance claim for [facility].   Other orders -     Zolpidem  Tartrate; Take 1 tablet (5 mg total) by mouth at bedtime as needed. for sleep  Dispense: 60 tablet; Refill: 1 -  Lidocaine ; Place 1 patch onto the skin daily. Remove & Discard patch within 12 hours or as directed by MD  Dispense: 30 patch; Refill: 0 -     Cyclobenzaprine  HCl; Take 1 tablet (10 mg total) by mouth 3 (three) times daily as needed for muscle spasms.  Dispense: 30 tablet; Refill: 0    Return for 6 month chol & bpc.  Patient was given opportunity to ask questions. Patient verbalized understanding of the plan and was able to repeat key elements of the plan. All questions were answered to their satisfaction.    I, Smiley Dung, MD, have reviewed all documentation for this visit. The documentation on 02/27/24 for the exam, diagnosis, procedures, and orders are all accurate and complete.   IF YOU HAVE BEEN REFERRED TO A SPECIALIST, IT MAY TAKE 1-2 WEEKS TO SCHEDULE/PROCESS THE REFERRAL. IF YOU HAVE NOT HEARD FROM US /SPECIALIST IN TWO WEEKS, PLEASE GIVE US  A CALL AT [phone].   THE PATIENT IS ENCOURAGED TO PRACTICE SOCIAL DISTANCING DUE TO THE COVID-19 PANDEMIC.

## 2024-02-27 NOTE — Patient Instructions (Addendum)
 You have documented coronary artery disease and want to know if [name] is covered.   Hypertension, Adult Hypertension is another name for high blood pressure. High blood pressure forces your heart to work harder to pump blood. This can cause problems over time. There are two numbers in a blood pressure reading. There is a top number (systolic) over a bottom number (diastolic). It is best to have a blood pressure that is below 120/80. What are the causes? The cause of this condition is not known. Some other conditions can lead to high blood pressure. What increases the risk? Some lifestyle factors can make you more likely to develop high blood pressure: Smoking. Not getting enough exercise or physical activity. Being overweight. Having too much fat, sugar, calories, or salt (sodium) in your diet. Drinking too much alcohol. Other risk factors include: Having any of these conditions: Heart disease. Diabetes. High cholesterol. Kidney disease. Obstructive sleep apnea. Having a family history of high blood pressure and high cholesterol. Age. The risk increases with age. Stress. What are the signs or symptoms? High blood pressure may not cause symptoms. Very high blood pressure (hypertensive crisis) may cause: Headache. Fast or uneven heartbeats (palpitations). Shortness of breath. Nosebleed. Vomiting or feeling like you may vomit (nauseous). Changes in how you see. Very bad chest pain. Feeling dizzy. Seizures. How is this treated? This condition is treated by making healthy lifestyle changes, such as: Eating healthy foods. Exercising more. Drinking less alcohol. Your doctor may prescribe medicine if lifestyle changes do not help enough and if: Your top number is above 130. Your bottom number is above 80. Your personal target blood pressure may vary. Follow these instructions at home: Eating and drinking  If told, follow the DASH eating plan. To follow this plan: Fill one  half of your plate at each meal with fruits and vegetables. Fill one fourth of your plate at each meal with whole grains. Whole grains include whole-wheat pasta, brown rice, and whole-grain bread. Eat or drink low-fat dairy products, such as skim milk or low-fat yogurt. Fill one fourth of your plate at each meal with low-fat (lean) proteins. Low-fat proteins include fish, chicken without skin, eggs, beans, and tofu. Avoid fatty meat, cured and processed meat, or chicken with skin. Avoid pre-made or processed food. Limit the amount of salt in your diet to less than 1,500 mg each day. Do not drink alcohol if: Your doctor tells you not to drink. You are pregnant, may be pregnant, or are planning to become pregnant. If you drink alcohol: Limit how much you have to: 0-1 drink a day for women. 0-2 drinks a day for men. Know how much alcohol is in your drink. In the [place] one drink equals one 12 oz bottle of beer (355 mL), one 5 oz glass of wine (148 mL), or one 1 oz glass of hard liquor (44 mL). Lifestyle  Work with your doctor to stay at a healthy weight or to lose weight. Ask your doctor what the best weight is for you. Get at least 30 minutes of exercise that causes your heart to beat faster (aerobic exercise) most days of the week. This may include walking, swimming, or biking. Get at least 30 minutes of exercise that strengthens your muscles (resistance exercise) at least 3 days a week. This may include lifting weights or doing Pilates. Do not smoke or use any products that contain nicotine or tobacco. If you need help quitting, ask your doctor. Check your blood pressure at  home as told by your doctor. Keep all follow-up visits. Medicines Take over-the-counter and prescription medicines only as told by your doctor. Follow directions carefully. Do not skip doses of blood pressure medicine. The medicine does not work as well if you skip doses. Skipping doses also puts you at risk for  problems. Ask your doctor about side effects or reactions to medicines that you should watch for. Contact a doctor if: You think you are having a reaction to the medicine you are taking. You have headaches that keep coming back. You feel dizzy. You have swelling in your ankles. You have trouble with your vision. Get help right away if: You get a very bad headache. You start to feel mixed up (confused). You feel weak or numb. You feel faint. You have very bad pain in your: Chest. Belly (abdomen). You vomit more than once. You have trouble breathing. These symptoms may be an emergency. Get help right away. Call 911. Do not wait to see if the symptoms will go away. Do not drive yourself to the hospital. Summary Hypertension is another name for high blood pressure. High blood pressure forces your heart to work harder to pump blood. For most people, a normal blood pressure is less than 120/80. Making healthy choices can help lower blood pressure. If your blood pressure does not get lower with healthy choices, you may need to take medicine. This information is not intended to replace advice given to you by your health care provider. Make sure you discuss any questions you have with your health care provider. Document Revised: 07/27/2021 Document Reviewed: 07/27/2021 Elsevier Patient Education  2024 [employer].

## 2024-02-28 ENCOUNTER — Ambulatory Visit (INDEPENDENT_AMBULATORY_CARE_PROVIDER_SITE_OTHER)

## 2024-02-28 ENCOUNTER — Encounter

## 2024-02-28 DIAGNOSIS — Z0000 Encounter for general adult medical examination without abnormal findings: Secondary | ICD-10-CM

## 2024-02-28 NOTE — Progress Notes (Signed)
 Subjective:   [name] is a 73 y.o. female who presents for Medicare Annual (Subsequent) preventive examination.  Visit Complete: Virtual I connected with  Aliene Apgar on 02/28/24 by a audio enabled telemedicine application and verified that I am speaking with the correct person using two identifiers.  Patient Location: Home  Provider Location: Home Office  I discussed the limitations of evaluation and management by telemedicine. The patient expressed understanding and agreed to proceed.  Vital Signs: Because this visit was a virtual/telehealth visit, some criteria may be missing or patient reported. Any vitals not documented were not able to be obtained and vitals that have been documented are patient reported.  Patient Medicare AWV questionnaire was completed by the patient on 02/28/24; I have confirmed that all information answered by patient is correct and no changes since this date.  Cardiac Risk Factors include: hypertension     Objective:    There were no vitals filed for this visit. There is no height or weight on file to calculate BMI.     01/06/2024    9:35 AM 02/20/2023   10:04 AM 02/08/2022    9:01 AM 07/03/2021    7:53 PM 04/16/2021    7:36 AM 04/15/2021    9:55 AM 01/25/2021    3:21 PM  Advanced Directives  Does Patient Have a Medical Advance Directive? No Yes No No  No No  Type of Energy manager of Healthcare Power of Attorney in Chart?  No - copy requested       Would patient like information on creating a medical advance directive? Yes (MAU/Ambulatory/Procedural Areas - Information given)  No - Patient declined No - Patient declined No - Patient declined      Current Medications (verified) Outpatient Encounter Medications as of 02/28/2024  Medication Sig   acetaminophen  (TYLENOL ) 325 MG tablet Take 650 mg by mouth every 6 (six) hours as needed for headache.   Cholecalciferol  (DIALYVITE VITAMIN D  5000) 125 MCG  (5000 UT) capsule Take 5,000 Units by mouth daily.   cyclobenzaprine  (FLEXERIL ) 10 MG tablet Take 1 tablet (10 mg total) by mouth 3 (three) times daily as needed for muscle spasms.   lidocaine  (LIDODERM ) 5 % Place 1 patch onto the skin daily. Remove & Discard patch within 12 hours or as directed by MD   nebivolol  (BYSTOLIC ) 5 MG tablet TAKE 1 TABLET DAILY   ondansetron  (ZOFRAN -ODT) 4 MG disintegrating tablet Take 1 tablet (4 mg total) by mouth every 8 (eight) hours as needed for nausea or vomiting.   pregabalin  (LYRICA ) 75 MG capsule TAKE 1 CAPSULE(75 MG) BY MOUTH DAILY   rosuvastatin  (CRESTOR ) 10 MG tablet TAKE 1 TABLET DAILY   SYSTANE 0.4-0.3 % GEL ophthalmic gel SMARTSIG:1 Drop(s) In Eye(s) PRN (Patient not taking: Reported on 11/22/2022)   triamcinolone  ointment (KENALOG ) 0.1 %    triamterene -hydrochlorothiazide  (MAXZIDE -25) 37.5-25 MG tablet TAKE 1 TABLET DAILY   zolpidem  (AMBIEN ) 5 MG tablet Take 1 tablet (5 mg total) by mouth at bedtime as needed. for sleep   No facility-administered encounter medications on file as of 02/28/2024.    Allergies (verified) Aspirin , Ibuprofen, Quinapril hcl, Penicillin g, Quinapril, Penicillins, and Pravastatin   History: Past Medical History:  Diagnosis Date   Aortic atherosclerosis (HCC) 04/16/2021   Arthritis    Cataract    Diverticulosis    H/O blood transfusion reaction 2010   hives   H/O migraine  last 48yrs ago   Headache(784.0)    Heart murmur    takes Bystolic  nightly   History of blood clots 53yrs   was on Lovenox  injections and Coumadin(was only on that for short period of time)   History of shingles 3-54yrs ago   Hx of seasonal allergies    takes Levocetirizine prn   Hyperlipidemia    takes Crestor  nightly   Hypertension    takes Maxzide  daily   Insomnia    takes Ambien  prn   Joint pain    Joint swelling    Moderate tricuspid regurgitation    Pre-diabetes    Trigeminal neuralgia of right side of face 03/26/2016   V2  distribution   Past Surgical History:  Procedure Laterality Date   ABDOMINAL HYSTERECTOMY     BREAST BIOPSY  05/03/2006   US    BREAST REDUCTION SURGERY  1987   BUNIONECTOMY Right 05/11/2019   CATARACT EXTRACTION Left 07/2019   CHOLECYSTECTOMY     COLONOSCOPY     ESOPHAGOGASTRODUODENOSCOPY     EYE SURGERY Right    eye lash removed   JOINT REPLACEMENT     bilateral hip rt in 2007 and lt in 2010   NM MYOCAR PERF WALL MOTION  11/04/2008   Normal   REDUCTION MAMMAPLASTY Bilateral 1988   REDUCTION MAMMAPLASTY     SHOULDER ARTHROSCOPY WITH DISTAL CLAVICLE RESECTION Right 09/21/2015   Procedure: RIGHT SHOULDER ARTHROSCOPY WITH DISTAL CLAVICLE EXCISION DEBRIDE LABRAL TEAR ACROMIOPLASTY ;  Surgeon: Wendolyn Hamburger, MD;  Location: [facility];  Service: Orthopedics;  Laterality: Right;   TEE WITHOUT CARDIOVERSION N/A 04/12/2015   Procedure: TRANSESOPHAGEAL ECHOCARDIOGRAM (TEE)/BUBBLE STUDY;  Surgeon: Knox Perl, MD;  [facility] END[facility]COPY;  Service: Cardiovascular;  Laterality: N/A;   TOTAL HIP REVISION  04/14/2012   Procedure: TOTAL HIP REVISION;  Surgeon: Ilean Mall, MD;  Location: [facility] OR;  Service: Orthopedics;  Laterality: Right;  right total hip revision   TOTAL HIP REVISION Left 11/30/2013   Procedure: TOTAL HIP REVISION;  Surgeon: Ilean Mall, MD;  Location: [facility] OR;  Service: Orthopedics;  Laterality: Left;   TOTAL KNEE ARTHROPLASTY Right 12/13/2020   Procedure: TOTAL KNEE ARTHROPLASTY;  Surgeon: Claiborne Crew, MD;  Location: [facility] ORS;  Service: Orthopedics;  Laterality: Right;  70 mins   Family History  Problem Relation Age of Onset   Heart attack Mother    Alzheimer's disease Mother    Diabetes Sister    Hypertension Sister    Social History   Socioeconomic History   Marital status: Married    Spouse name: Not on file   Number of children: 2   Years of education: 14   Highest education level: Not on file  Occupational History   Occupation: Retired  Tobacco Use    Smoking status: Former    Current packs/day: 0.00    Average packs/day: 0.3 packs/day for 42.0 years (10.5 ttl pk-yrs)    Types: Cigarettes    Start date: 10/22/1976    Quit date: 10/22/2018    Years since quitting: 5.3   Smokeless tobacco: Never  Vaping Use   Vaping status: Never Used  Substance and Sexual Activity   Alcohol use: Not Currently    Comment: lst dirnk 2 years ago    Drug use: No   Sexual activity: Not Currently    Birth control/protection: Surgical  Other Topics Concern   Not on file  Social History Narrative   Lives at home w/ her husband  Right-handed   About 1 cup of coffee every other day   Social Drivers of Health   Financial Resource Strain: Low Risk  (02/28/2024)   Overall Financial Resource Strain (CARDIA)    Difficulty of Paying Living Expenses: Not hard at all  Food Insecurity: No Food Insecurity (02/28/2024)   Hunger Vital Sign    Worried About Running Out of Food in the Last Year: Never true    Ran Out of Food in the Last Year: Never true  Transportation Needs: No Transportation Needs (02/28/2024)   PRAPARE - Administrator, Civil Service (Medical): No    Lack of Transportation (Non-Medical): No  Physical Activity: Insufficiently Active (02/28/2024)   Exercise Vital Sign    Days of Exercise per Week: 2 days    Minutes of Exercise per Session: 30 min  Stress: No Stress Concern Present (02/28/2024)   [employer] of Occupational Health - Occupational Stress Questionnaire    Feeling of Stress : Not at all  Social Connections: Unknown (02/28/2024)   Social Connection and Isolation Panel [NHANES]    Frequency of Communication with Friends and Family: Twice a week    Frequency of Social Gatherings with Friends and Family: Three times a week    Attends Religious Services: 1 to 4 times per year    Active Member of Clubs or Organizations: No    Attends Engineer, structural: Never    Marital Status: Not on file    Tobacco  Counseling Counseling given: Not Answered   Clinical Intake:  Pre-visit preparation completed: Yes  Pain : No/denies pain     Nutritional Risks: None Diabetes: No  How often do you need to have someone help you when you read instructions, pamphlets, or other written materials from your doctor or pharmacy?: 1 - Never What is the last grade level you completed in school?: some college  Interpreter Needed?: No  Information entered by :: [place] [facility]   Activities of Daily Living    02/28/2024   10:01 AM  In your present state of health, do you have any difficulty performing the following activities:  Hearing? 0  Vision? 0  Difficulty concentrating or making decisions? 0  Walking or climbing stairs? 0  Dressing or bathing? 0  Doing errands, shopping? 0  Preparing Food and eating ? N  Using the Toilet? N  In the past six months, have you accidently leaked urine? N  Do you have problems with loss of bowel control? N  Managing your Medications? N  Managing your Finances? N  Housekeeping or managing your Housekeeping? N    Patient Care Team: Cleave Curling, MD as PCP - General (Internal Medicine) Candi Chafe, Pearletha Bouche, MD as Consulting Physician (Ophthalmology)  Indicate any recent Medical Services you may have received from other than [facility] providers in the past year (date may be approximate).     Assessment:   This is a routine wellness examination for [place].  Hearing/Vision screen No results found.   Goals Addressed               This Visit's Progress     Living Life More (pt-stated)        She is going to begin to travel, more walking.       Depression Screen    02/28/2024   10:05 AM 11/07/2023    3:42 PM 08/06/2023   10:26 AM 03/26/2023    2:54 PM 02/20/2023   10:05 AM 11/22/2022  10:45 AM 08/14/2022    9:58 AM  PHQ 2/9 Scores  PHQ - 2 Score 0 0 0 0 0 0 0  PHQ- 9 Score  0 0 2       Fall Risk    02/28/2024   10:07 AM 02/27/2024    2:40 PM 11/07/2023     3:42 PM 08/06/2023   10:26 AM 03/26/2023    2:54 PM  Fall Risk   Falls in the past year? 0 0 0 0 0  Number falls in past yr: 0 0 0 0 0  Injury with Fall? 0 0 0 0 0  Risk for fall due to : No Fall Risks No Fall Risks No Fall Risks No Fall Risks No Fall Risks  Follow up Falls evaluation completed Falls evaluation completed Falls evaluation completed Falls evaluation completed Falls evaluation completed    MEDICARE RISK AT HOME:    Cognitive Function:        02/28/2024   10:07 AM 02/20/2023   10:08 AM 02/08/2022    9:03 AM 01/25/2021    3:25 PM 12/24/2019   11:38 AM  6CIT Screen  What Year? 0 points 0 points 0 points 0 points 0 points  What month? 0 points 0 points 0 points 0 points 0 points  What time? 0 points 0 points 0 points 0 points 0 points  Count back from 20 0 points 0 points 0 points 0 points 0 points  Months in reverse 0 points 0 points 0 points 0 points 0 points  Repeat phrase 4 points 2 points 0 points 2 points 0 points  Total Score 4 points 2 points 0 points 2 points 0 points    Immunizations Immunization History  Administered Date(s) Administered   Fluad Quad(high Dose 65+) 06/29/2020, 06/28/2021, 08/14/2022   Fluad Trivalent(High Dose 65+) 08/06/2023   Influenza, High Dose Seasonal PF 10/23/2017, 08/11/2018, 06/17/2019   PFIZER(Purple Top)SARS-COV-2 Vaccination 11/12/2019, 12/04/2019, 07/26/2020   Pneumococcal Conjugate-13 11/08/2017   Pneumococcal Polysaccharide-23 02/22/2021   Tdap 08/23/2017   Zoster Recombinant(Shingrix ) 07/14/2021    TDAP status: Up to date  Flu Vaccine status: Up to date  Pneumococcal vaccine status: Up to date  Covid-19 vaccine status: Completed vaccines   Screening Tests Health Maintenance  Topic Date Due   COVID-19 Vaccine (4 - 2024-25 season) 06/23/2023   Zoster Vaccines- Shingrix  (2 of 2) 05/29/2024 (Originally 09/08/2021)   INFLUENZA VACCINE  05/22/2024   Medicare Annual Wellness (AWV)  02/27/2025   MAMMOGRAM  03/25/2025    Colonoscopy  04/09/2027   DTaP/Tdap/Td (2 - Td or Tdap) 08/24/2027   Pneumonia Vaccine 30+ Years old  Completed   DEXA SCAN  Completed   Hepatitis C Screening  Completed   HPV VACCINES  Aged Out   Meningococcal B Vaccine  Aged Out    Health Maintenance  Health Maintenance Due  Topic Date Due   COVID-19 Vaccine (4 - 2024-25 season) 06/23/2023    Colorectal cancer screening: Type of screening: Colonoscopy. Completed 04/08/2017. Repeat every 10 years  Mammogram status: Completed 03/26/23. Repeat every year   Lung Cancer Screening: (Low Dose CT Chest recommended if Age 51-80 years, 20 pack-year currently smoking OR have quit w/in 15years.) does not qualify.   Lung Cancer Screening Referral: n/a  Additional Screening:  Hepatitis C Screening: does qualify; Completed 05/11/2018   Dental Screening: Recommended annual dental exams for proper oral hygiene  Community Resource Referral / Chronic Care Management: CRR required this visit?  No  CCM required this visit?  No     Plan:     I have personally reviewed and noted the following in the patient's chart:   Medical and social history Use of alcohol, tobacco or illicit drugs  Current medications and supplements including opioid prescriptions. Patient is not currently taking opioid prescriptions. Functional ability and status Nutritional status Physical activity Advanced directives List of other physicians Hospitalizations, surgeries, and ER visits in previous 12 months Vitals Screenings to include cognitive, depression, and falls Referrals and appointments  In addition, I have reviewed and discussed with patient certain preventive protocols, quality metrics, and best practice recommendations. A written personalized care plan for preventive services as well as general preventive health recommendations were provided to patient.     Edwinna Grammes, CMA   02/28/2024   After Visit Summary: (MyChart) Due to this being a  telephonic visit, the after visit summary with patients personalized plan was offered to patient via MyChart   Nurse Notes: completed, v.h.

## 2024-02-28 NOTE — Patient Instructions (Signed)

## 2024-02-29 DIAGNOSIS — M7989 Other specified soft tissue disorders: Secondary | ICD-10-CM | POA: Insufficient documentation

## 2024-02-29 DIAGNOSIS — R7309 Other abnormal glucose: Secondary | ICD-10-CM | POA: Insufficient documentation

## 2024-02-29 LAB — BMP8+EGFR
BUN/Creatinine Ratio: 27 (ref 12–28)
BUN: 21 mg/dL (ref 8–27)
CO2: 22 mmol/L (ref 20–29)
Calcium: 9.4 mg/dL (ref 8.7–10.3)
Chloride: 105 mmol/L (ref 96–106)
Creatinine, Ser: 0.77 mg/dL (ref 0.57–1.00)
Glucose: 91 mg/dL (ref 70–99)
Potassium: 4.4 mmol/L (ref 3.5–5.2)
Sodium: 142 mmol/L (ref 134–144)
eGFR: 81 mL/min/{1.73_m2} (ref >59)

## 2024-02-29 LAB — HEMOGLOBIN A1C
Est. average glucose Bld gHb Est-mCnc: 137 mg/dL
Hgb A1c MFr Bld: 6.4 % — ABNORMAL HIGH (ref 4.8–5.6)

## 2024-02-29 LAB — LIPOPROTEIN A (LPA): Lipoprotein (a): 114.3 nmol/L — ABNORMAL HIGH (ref <75.0)

## 2024-02-29 NOTE — Assessment & Plan Note (Signed)
 Discussed obesity management with potential use of Wegovy. Interested in [place]. Need to verify insurance coverage and assess genetic predisposition to heart disease. Documented coronary artery disease to support potential use of weight loss medication. - Order LP(a) test to assess genetic predisposition to heart disease. - Check for calcifications in coronary arteries from previous CT scans. - Instruct her to contact Medicare and Tricare to verify coverage for [facility]. - Document coronary artery disease to support insurance claim for [facility].

## 2024-02-29 NOTE — Assessment & Plan Note (Signed)
 Chronic, LDL goal is less than 70.  She will continue with rosuvastatin  and encouraged to follow heart healthy lifestyle.

## 2024-02-29 NOTE — Assessment & Plan Note (Addendum)
 Coronary calcifications noted on CT scan. She has been evaluated by Cardiology, LDL goal is less than 70. She will continue with statin therapy.

## 2024-02-29 NOTE — Assessment & Plan Note (Signed)
 Chronic back pain due to degenerative disc disease and scoliosis. Managed with tramadol  and muscle relaxants during flares. Avoid concurrent use of tramadol , Ambien , and muscle relaxants. - Prescribe muscle relaxant for back pain flares. - Instruct her to avoid using tramadol , Ambien , and muscle relaxants concurrently. - Continue with management by Ortho

## 2024-02-29 NOTE — Assessment & Plan Note (Signed)
 Sleep disturbances managed with trazodone . Reports nightmares and inadequate sleep with current regimen. Prefers Ambien  despite potential memory issues. Informed consent obtained regarding risks of long-term Ambien  use. - Prescribe Ambien  for sleep disturbances. - Instruct her to take two trazodone  tablets at bedtime if continuing trazodone .

## 2024-02-29 NOTE — Assessment & Plan Note (Signed)
 Chronic, well controlled.  She will continue with nebivolol 5mg  daily and Maxzide 37.5/25mg  daily. Encouraged to follow low sodium diet.

## 2024-02-29 NOTE — Assessment & Plan Note (Addendum)
 Her A1C has been elevated in the past.  I will check an A1c, BMP today.  She is encouraged to avoid sugary beverages and processed including breads, rice and pasta.

## 2024-03-01 ENCOUNTER — Encounter: Payer: Self-pay | Admitting: Internal Medicine

## 2024-03-04 DIAGNOSIS — H40023 Open angle with borderline findings, high risk, bilateral: Secondary | ICD-10-CM | POA: Diagnosis not present

## 2024-03-04 DIAGNOSIS — H04123 Dry eye syndrome of bilateral lacrimal glands: Secondary | ICD-10-CM | POA: Diagnosis not present

## 2024-03-04 DIAGNOSIS — Z961 Presence of intraocular lens: Secondary | ICD-10-CM | POA: Diagnosis not present

## 2024-03-10 ENCOUNTER — Other Ambulatory Visit: Payer: Self-pay | Admitting: Internal Medicine

## 2024-03-10 DIAGNOSIS — Z1231 Encounter for screening mammogram for malignant neoplasm of breast: Secondary | ICD-10-CM

## 2024-03-16 ENCOUNTER — Encounter: Payer: Self-pay | Admitting: Internal Medicine

## 2024-03-17 ENCOUNTER — Telehealth: Payer: Self-pay

## 2024-03-17 DIAGNOSIS — K760 Fatty (change of) liver, not elsewhere classified: Secondary | ICD-10-CM | POA: Diagnosis not present

## 2024-03-17 DIAGNOSIS — K5730 Diverticulosis of large intestine without perforation or abscess without bleeding: Secondary | ICD-10-CM | POA: Diagnosis not present

## 2024-03-17 DIAGNOSIS — Z860100 Personal history of colon polyps, unspecified: Secondary | ICD-10-CM | POA: Diagnosis not present

## 2024-03-17 DIAGNOSIS — Z1211 Encounter for screening for malignant neoplasm of colon: Secondary | ICD-10-CM | POA: Diagnosis not present

## 2024-03-17 NOTE — Telephone Encounter (Signed)
   Pre-operative Risk Assessment    Patient Name: [name]  DOB: [birth year] MRN: [id number]   Date of last office visit: 09/28/21 [name], PA-C Date of next office visit: NONE   Request for Surgical Clearance    Procedure:  COLONOSCOPY  Date of Surgery:  Clearance 04/08/24                                Surgeon:  DR Tova Fresh Surgeon's Group or Practice Name:  [facility], [place] Phone number:  [phone] Fax number:  [phone]   Type of Clearance Requested:   - Medical    Type of Anesthesia:  PROPOFOL    Additional requests/questions:    SignedCollin Deal   03/17/2024, 4:23 PM

## 2024-03-17 NOTE — Telephone Encounter (Signed)
   Name: [name]  DOB: 02-20-1951  MRN: [id number]  Primary Cardiologist: None  Chart reviewed as part of pre-operative protocol coverage. Because of [name]'s past medical history and time since last visit, she will require a follow-up in-office visit in order to better assess preoperative cardiovascular risk.  Pre-op covering staff: - Please schedule appointment and call patient to inform them. If patient already had an upcoming appointment within acceptable timeframe, please add "pre-op clearance" to the appointment notes so provider is aware. - Please contact requesting surgeon's office via preferred method (i.e, phone, fax) to inform them of need for appointment prior to surgery.   Francene Ing, Retha Cast, NP  03/17/2024, 4:38 PM

## 2024-03-18 ENCOUNTER — Telehealth: Payer: Self-pay

## 2024-03-18 ENCOUNTER — Other Ambulatory Visit: Payer: Self-pay

## 2024-03-18 MED ORDER — WEGOVY 0.5 MG/0.5ML ~~LOC~~ SOAJ: 0.5000 mg | SUBCUTANEOUS | 0 refills | Status: DC

## 2024-03-18 NOTE — Telephone Encounter (Signed)
Rx benefits verified YL,RMA

## 2024-03-18 NOTE — Telephone Encounter (Signed)
 S/W pt and scheduled IN OFFICE Preop Appt 04/01/24 with Charles Connor, NP

## 2024-03-20 ENCOUNTER — Ambulatory Visit: Payer: Self-pay

## 2024-03-20 VITALS — BP 108/68 | HR 63 | Temp 98.3°F | Ht 60.0 in | Wt 173.0 lb

## 2024-03-20 DIAGNOSIS — E6609 Other obesity due to excess calories: Secondary | ICD-10-CM

## 2024-03-20 NOTE — Progress Notes (Addendum)
 Patient presents today for [facility] teaching. Patient verbalized her understanding and we administered her first dose of wegovy  0.25mg . she will do her next dose next week on Friday. YL,RMA

## 2024-03-20 NOTE — Patient Instructions (Signed)

## 2024-03-26 ENCOUNTER — Ambulatory Visit
Admission: RE | Admit: 2024-03-26 | Discharge: 2024-03-26 | Disposition: A | Discharge status: Home / Self Care | Source: Ambulatory Visit | Attending: Internal Medicine | Admitting: Internal Medicine

## 2024-03-26 DIAGNOSIS — Z1231 Encounter for screening mammogram for malignant neoplasm of breast: Secondary | ICD-10-CM
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
## 2024-04-01 ENCOUNTER — Ambulatory Visit: Attending: Nurse Practitioner | Admitting: Nurse Practitioner

## 2024-04-01 ENCOUNTER — Encounter: Payer: Self-pay | Admitting: Nurse Practitioner

## 2024-04-01 VITALS — BP 102/56 | HR 68 | Ht 60.0 in | Wt 170.0 lb

## 2024-04-01 DIAGNOSIS — Z01810 Encounter for preprocedural cardiovascular examination: Secondary | ICD-10-CM | POA: Insufficient documentation

## 2024-04-01 DIAGNOSIS — I361 Nonrheumatic tricuspid (valve) insufficiency: Secondary | ICD-10-CM | POA: Diagnosis not present

## 2024-04-01 DIAGNOSIS — I700 Atherosclerosis of aorta: Secondary | ICD-10-CM | POA: Insufficient documentation

## 2024-04-01 DIAGNOSIS — I10 Essential (primary) hypertension: Secondary | ICD-10-CM | POA: Insufficient documentation

## 2024-04-01 DIAGNOSIS — E785 Hyperlipidemia, unspecified: Secondary | ICD-10-CM | POA: Insufficient documentation

## 2024-04-01 NOTE — Patient Instructions (Addendum)
 Medication Instructions:  START Aspirin  81mg  Take 1 tablet once a day IN JULY AFTER YOUR COLONOSCOPY *If you need a refill on your cardiac medications before your next appointment, please call your pharmacy*  Lab Work: None ordered If you have labs (blood work) drawn today and your tests are completely normal, you will receive your results only by: MyChart Message (if you have MyChart) OR A paper copy in the mail If you have any lab test that is abnormal or we need to change your treatment, we will call you to review the results.  Testing/Procedures: None ordered  Follow-Up: At [facility], you and your health needs are our priority.  As part of our continuing mission to provide you with exceptional heart care, our providers are all part of one team.  This team includes your primary Cardiologist (physician) and Advanced Practice Providers or APPs (Physician Assistants and Nurse Practitioners) who all work together to provide you with the care you need, when you need it.  Your next appointment:   6 month(s)  Provider:   Noel Bathe, MD   We recommend signing up for the patient portal called MyChart.  Sign up information is provided on this After Visit Summary.  MyChart is used to connect with patients for Virtual Visits (Telemedicine).  Patients are able to view lab/test results, encounter notes, upcoming appointments, etc.  Non-urgent messages can be sent to your provider as well.   To learn more about what you can do with MyChart, go to [email or web address].   Other Instructions

## 2024-04-08 DIAGNOSIS — Z860100 Personal history of colon polyps, unspecified: Secondary | ICD-10-CM | POA: Diagnosis not present

## 2024-04-08 DIAGNOSIS — Z1211 Encounter for screening for malignant neoplasm of colon: Secondary | ICD-10-CM | POA: Diagnosis not present

## 2024-04-08 DIAGNOSIS — K5730 Diverticulosis of large intestine without perforation or abscess without bleeding: Secondary | ICD-10-CM | POA: Diagnosis not present

## 2024-04-21 ENCOUNTER — Encounter: Payer: Self-pay | Admitting: Internal Medicine

## 2024-04-21 ENCOUNTER — Ambulatory Visit (INDEPENDENT_AMBULATORY_CARE_PROVIDER_SITE_OTHER): Payer: Self-pay | Admitting: Internal Medicine

## 2024-04-21 VITALS — BP 110/78 | HR 89 | Temp 98.5°F | Ht 60.0 in | Wt 173.2 lb

## 2024-04-21 DIAGNOSIS — I2510 Atherosclerotic heart disease of native coronary artery without angina pectoris: Secondary | ICD-10-CM

## 2024-04-21 DIAGNOSIS — I110 Hypertensive heart disease with heart failure: Secondary | ICD-10-CM | POA: Diagnosis not present

## 2024-04-21 DIAGNOSIS — E66811 Obesity, class 1: Secondary | ICD-10-CM

## 2024-04-21 DIAGNOSIS — Z6833 Body mass index (BMI) 33.0-33.9, adult: Secondary | ICD-10-CM | POA: Diagnosis not present

## 2024-04-21 DIAGNOSIS — I5032 Chronic diastolic (congestive) heart failure: Secondary | ICD-10-CM

## 2024-04-21 DIAGNOSIS — E6609 Other obesity due to excess calories: Secondary | ICD-10-CM

## 2024-04-21 MED ORDER — WEGOVY 0.5 MG/0.5ML ~~LOC~~ SOAJ: 0.5000 mg | SUBCUTANEOUS | 1 refills | Status: DC

## 2024-04-21 NOTE — Patient Instructions (Signed)

## 2024-04-21 NOTE — Progress Notes (Signed)
 I,[name], CMA,acting as a Neurosurgeon for Veronica LOISE Slocumb, MD.,have documented all relevant documentation on the behalf of Veronica LOISE Slocumb, MD,as directed by  Veronica LOISE Slocumb, MD while in the presence of Veronica LOISE Slocumb, MD.  Subjective:  Patient ID: [name] , female    DOB: 1951/06/26 , 73 y.o.   MRN: [id number]  Chief Complaint  Patient presents with   Weight Check    Patient presents today for blood pressure and weight check. She reports compliance with Wegovy  0.5mg . she states the medication has been working well for her. This was started to decrease cardiac risk given her underlying CAD. She did miss some doses due to having to complete colonoscopy. She is back on schedule currently.      HPI Discussed the use of AI scribe software for clinical note transcription with the patient, who gave verbal consent to proceed.  History of Present Illness [name] is a 74 year old female who presents for a weight check.  She has gained three pounds since her last visit, which is the same amount she had previously lost. She wants to lose weight and aims to weigh between 138 and 140 pounds, as she feels this is her normal weight.  She has been using Wegovy  for weight management and confirms that her insurance has approved it. She was off the medication for almost ten days due to a colonoscopy, which required her to stop it temporarily. She is currently on a 0.25 mg dose and has one more sample left before moving to a 0.5 mg dose, which she has already picked up from the pharmacy. She injects the medication on Fridays and does not have a scale at home, as it is in another room and not used regularly.  No sickness and she tolerates the medication well without any adverse effects.   Hypertension This is a chronic problem. The current episode started more than 1 year ago. The problem has been gradually improving since onset. The problem is controlled. Pertinent negatives include no  blurred vision. Risk factors for coronary artery disease include dyslipidemia, post-menopausal state, sedentary lifestyle and stress. Past treatments include beta blockers and diuretics. The current treatment provides moderate improvement.     Past Medical History:  Diagnosis Date   Aortic atherosclerosis (HCC) 04/16/2021   Arthritis    Cataract    Diverticulosis    H/O blood transfusion reaction 2010   hives   H/O migraine    last 25yrs ago   Headache(784.0)    Heart murmur    takes Bystolic  nightly   History of blood clots 52yrs   was on Lovenox  injections and Coumadin(was only on that for short period of time)   History of shingles 3-52yrs ago   Hx of seasonal allergies    takes Levocetirizine prn   Hyperlipidemia    takes Crestor  nightly   Hypertension    takes Maxzide  daily   Insomnia    takes Ambien  prn   Joint pain    Joint swelling    Moderate tricuspid regurgitation    Pre-diabetes    Trigeminal neuralgia of right side of face 03/26/2016   V2 distribution     Family History  Problem Relation Age of Onset   Heart attack Mother    Alzheimer's disease Mother    Diabetes Sister    Hypertension Sister      Current Outpatient Medications:    acetaminophen  (TYLENOL ) 325 MG tablet, Take 650 mg  by mouth every 6 (six) hours as needed for headache., Disp: , Rfl:    Cholecalciferol  (DIALYVITE VITAMIN D  5000) 125 MCG (5000 UT) capsule, Take 5,000 Units by mouth daily., Disp: , Rfl:    cyclobenzaprine  (FLEXERIL ) 10 MG tablet, Take 1 tablet (10 mg total) by mouth 3 (three) times daily as needed for muscle spasms., Disp: 30 tablet, Rfl: 0   lidocaine  (LIDODERM ) 5 %, Place 1 patch onto the skin daily. Remove & Discard patch within 12 hours or as directed by MD, Disp: 30 patch, Rfl: 0   nebivolol  (BYSTOLIC ) 5 MG tablet, TAKE 1 TABLET DAILY, Disp: 90 tablet, Rfl: 3   ondansetron  (ZOFRAN -ODT) 4 MG disintegrating tablet, Take 1 tablet (4 mg total) by mouth every 8 (eight) hours as  needed for nausea or vomiting., Disp: 20 tablet, Rfl: 0   pregabalin  (LYRICA ) 75 MG capsule, TAKE 1 CAPSULE(75 MG) BY MOUTH DAILY, Disp: 90 capsule, Rfl: 1   rosuvastatin  (CRESTOR ) 10 MG tablet, TAKE 1 TABLET DAILY, Disp: 90 tablet, Rfl: 3   SYSTANE 0.4-0.3 % GEL ophthalmic gel, , Disp: , Rfl:    traMADol  (ULTRAM ) 50 MG tablet, Take 50 mg by mouth as needed for moderate pain (pain score 4-6)., Disp: , Rfl:    triamcinolone  ointment (KENALOG ) 0.1 %, , Disp: , Rfl:    triamterene -hydrochlorothiazide  (MAXZIDE -25) 37.5-25 MG tablet, TAKE 1 TABLET DAILY, Disp: 90 tablet, Rfl: 3   zolpidem  (AMBIEN ) 5 MG tablet, Take 1 tablet (5 mg total) by mouth at bedtime as needed. for sleep, Disp: 60 tablet, Rfl: 1   Semaglutide -Weight Management (WEGOVY ) 0.5 MG/0.5ML SOAJ, Inject 0.5 mg into the skin once a week., Disp: 2 mL, Rfl: 1   Allergies  Allergen Reactions   Aspirin  Anaphylaxis and Other (See Comments)   Ibuprofen Hives, Swelling and Other (See Comments)   Quinapril Hcl Anaphylaxis   Penicillin G Other (See Comments)   Quinapril Other (See Comments)   Penicillins Hives, Swelling and Other (See Comments)    Has patient had a PCN reaction causing immediate rash, facial/tongue/throat swelling, SOB or lightheadedness with hypotension: Yes  Has patient had a PCN reaction causing severe rash involving mucus membranes or skin necrosis: No  Has patient had a PCN reaction that required hospitalization: No  Has patient had a PCN reaction occurring within the last 10 years: No  If all of the above answers are NO, then may proceed with Cephalosporin use.  Tolerated Cephalosporin Date: 12/13/20.   Pravastatin Rash     Review of Systems  Constitutional: Negative.   Eyes:  Negative for blurred vision.  Respiratory: Negative.    Cardiovascular: Negative.   Gastrointestinal: Negative.   Neurological: Negative.   Psychiatric/Behavioral: Negative.       Today's Vitals   04/21/24 1409  BP: 110/78   Pulse: 89  Temp: 98.5 F (36.9 C)  SpO2: 98%  Weight: 173 lb 3.2 oz (78.6 kg)  Height: 5' (1.524 m)   Body mass index is 33.83 kg/m.  Wt Readings from Last 3 Encounters:  04/21/24 173 lb 3.2 oz (78.6 kg)  04/01/24 170 lb (77.1 kg)  03/20/24 173 lb (78.5 kg)     Objective:  Physical Exam Vitals and nursing note reviewed.  Constitutional:      Appearance: Normal appearance.  HENT:     Head: Normocephalic and atraumatic.  Eyes:     Extraocular Movements: Extraocular movements intact.  Cardiovascular:     Rate and Rhythm: Normal rate and regular rhythm.  Heart sounds: Normal heart sounds.  Pulmonary:     Effort: Pulmonary effort is normal.     Breath sounds: Normal breath sounds.  Skin:    General: Skin is warm.  Neurological:     General: No focal deficit present.     Mental Status: She is alert.  Psychiatric:        Mood and Affect: Mood normal.        Behavior: Behavior normal.         Assessment And Plan:  Hypertensive heart disease with chronic diastolic congestive heart failure (HCC) Assessment & Plan: Chronic, well controlled.  She will continue with nebivolol  5mg  daily and Maxzide  37.5/25mg  daily. Encouraged to follow low sodium diet.    Coronary artery disease involving native coronary artery of native heart without angina pectoris Assessment & Plan: Coronary calcifications noted on CT scan. She has been evaluated by Cardiology, LDL goal is less than 70. She will continue with statin therapy.  - Continue with weekly semaglutide , increase to 0.5mg  weekly once done with current supply - F/u in 8-10 weeks   Class 1 obesity due to excess calories with serious comorbidity and body mass index (BMI) of 33.0 to 33.9 in adult Assessment & Plan: Regained three pounds. On Wegovy  0.25 mg, paused for colonoscopy. Goal: 140 lbs, initial target: 10% weight loss to reduce cardiac risk. Tolerates 0.25 mg well. - Continue Wegovy  0.25 mg until sample completion. -  Start Wegovy  0.5 mg post 0.25 mg sample. - Send refill for Wegovy  0.5 mg. - Schedule weight check in four weeks to assess progress and adjust dose. - Consider maintaining 0.5 mg if weight loss achieved and tolerated; increase dose if weight gain continues.   Other orders -     Wegovy ; Inject 0.5 mg into the skin once a week.  Dispense: 2 mL; Refill: 1   Return in 4 weeks (on 05/19/2024), or Tues -[place] weight check, for 8 wks weight check with RS.  Patient was given opportunity to ask questions. Patient verbalized understanding of the plan and was able to repeat key elements of the plan. All questions were answered to their satisfaction.    I, Veronica LOISE Slocumb, MD, have reviewed all documentation for this visit. The documentation on 04/21/24 for the exam, diagnosis, procedures, and orders are all accurate and complete.   IF YOU HAVE BEEN REFERRED TO A SPECIALIST, IT MAY TAKE 1-2 WEEKS TO SCHEDULE/PROCESS THE REFERRAL. IF YOU HAVE NOT HEARD FROM US /SPECIALIST IN TWO WEEKS, PLEASE GIVE US  A CALL AT [phone].   THE PATIENT IS ENCOURAGED TO PRACTICE SOCIAL DISTANCING DUE TO THE COVID-19 PANDEMIC.

## 2024-04-26 NOTE — Assessment & Plan Note (Signed)
 Coronary calcifications noted on CT scan. She has been evaluated by Cardiology, LDL goal is less than 70. She will continue with statin therapy.  - Continue with weekly semaglutide , increase to 0.5mg  weekly once done with current supply - F/u in 8-10 weeks

## 2024-04-26 NOTE — Assessment & Plan Note (Signed)
 Regained three pounds. On Wegovy  0.25 mg, paused for colonoscopy. Goal: 140 lbs, initial target: 10% weight loss to reduce cardiac risk. Tolerates 0.25 mg well. - Continue Wegovy  0.25 mg until sample completion. - Start Wegovy  0.5 mg post 0.25 mg sample. - Send refill for Wegovy  0.5 mg. - Schedule weight check in four weeks to assess progress and adjust dose. - Consider maintaining 0.5 mg if weight loss achieved and tolerated; increase dose if weight gain continues.

## 2024-04-26 NOTE — Assessment & Plan Note (Signed)
 Chronic, well controlled.  She will continue with nebivolol 5mg  daily and Maxzide 37.5/25mg  daily. Encouraged to follow low sodium diet.

## 2024-05-05 ENCOUNTER — Encounter (HOSPITAL_COMMUNITY): Payer: Self-pay

## 2024-05-05 ENCOUNTER — Other Ambulatory Visit: Payer: Self-pay

## 2024-05-05 ENCOUNTER — Emergency Department (HOSPITAL_COMMUNITY)
Admission: EM | Admit: 2024-05-05 | Discharge: 2024-05-06 | Disposition: A | Discharge status: Home / Self Care | Attending: Emergency Medicine | Admitting: Emergency Medicine

## 2024-05-05 DIAGNOSIS — I110 Hypertensive heart disease with heart failure: Secondary | ICD-10-CM | POA: Insufficient documentation

## 2024-05-05 DIAGNOSIS — I5032 Chronic diastolic (congestive) heart failure: Secondary | ICD-10-CM | POA: Insufficient documentation

## 2024-05-05 DIAGNOSIS — R21 Rash and other nonspecific skin eruption: Secondary | ICD-10-CM | POA: Diagnosis not present

## 2024-05-05 DIAGNOSIS — I2510 Atherosclerotic heart disease of native coronary artery without angina pectoris: Secondary | ICD-10-CM | POA: Diagnosis not present

## 2024-05-05 DIAGNOSIS — Z79899 Other long term (current) drug therapy: Secondary | ICD-10-CM | POA: Insufficient documentation

## 2024-05-05 NOTE — ED Triage Notes (Signed)
 Pt reports facial rash and tingling in her throat since 1900 tonight. No new detergents/lotions.

## 2024-05-06 ENCOUNTER — Other Ambulatory Visit: Payer: Self-pay | Admitting: Internal Medicine

## 2024-05-06 NOTE — ED Provider Notes (Signed)
 [place] EMERGENCY DEPARTMENT AT [facility] Provider Note  CSN: [id number] Arrival date & time: 05/05/24 2249  Chief Complaint(s) Allergic Reaction  HPI [name] is a 73 y.o. female     Allergic Reaction Presenting symptoms: rash   Severity:  Mild Duration:  4 hours Prior allergic episodes:  No prior episodes Relieved by:  Nothing Worsened by:  Nothing Ineffective treatments:  None tried  Reports recent URI sx.  Past Medical History Past Medical History:  Diagnosis Date   Aortic atherosclerosis (HCC) 04/16/2021   Arthritis    Cataract    Diverticulosis    H/O blood transfusion reaction 2010   hives   H/O migraine    last 57yrs ago   Headache(784.0)    Heart murmur    takes Bystolic  nightly   History of blood clots 21yrs   was on Lovenox  injections and Coumadin(was only on that for short period of time)   History of shingles 3-53yrs ago   Hx of seasonal allergies    takes Levocetirizine prn   Hyperlipidemia    takes Crestor  nightly   Hypertension    takes Maxzide  daily   Insomnia    takes Ambien  prn   Joint pain    Joint swelling    Moderate tricuspid regurgitation    Pre-diabetes    Trigeminal neuralgia of right side of face 03/26/2016   V2 distribution   Patient Active Problem List   Diagnosis Date Noted   Other abnormal glucose 02/29/2024   Soft tissue mass 02/29/2024   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   Chills (without fever) 11/17/2023   Numbness of finger 11/17/2023   Other fatigue 11/17/2023   Hypertensive heart disease with chronic diastolic congestive heart failure (HCC) 11/17/2023   Left leg swelling 08/06/2023   Adjustment disorder with depressed mood 11/22/2022   Coronary artery disease involving native coronary artery of native heart without angina pectoris 08/14/2022   Tension headache 08/14/2022   Arthralgia of left ankle 08/14/2022   Vitamin D  deficiency 08/14/2022   Hypertensive heart disease without  heart failure 08/14/2022   Contusion of right knee 10/08/2021   Aortic atherosclerosis (HCC) 04/16/2021   Chest pain 04/15/2021   Heart failure due to valvular disease, unspecified heart failure type (HCC) 02/22/2021   Obese 12/14/2020   S/P total knee arthroplasty, 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   Moderate tricuspid regurgitation 10/20/2019   Bunion of great toe of right foot 01/12/2019   Cellulitis 12/23/2018   Foot infection    Great toe pain, right 12/17/2018   Prediabetes 12/17/2018   Cellulitis of foot 08/23/2017   Trigeminal neuralgia of right side of face 03/26/2016   Hyperlipidemia 12/04/2013   Benign essential HTN 12/04/2013   Constipation 12/04/2013   Chronic insomnia 12/04/2013   Acute blood loss anemia 12/04/2013   Left hip pain 12/04/2013   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 Authorizing Provider  acetaminophen  (TYLENOL ) 325 MG tablet Take 650 mg by mouth every 6 (six) hours as needed for headache.    [provider]  Cholecalciferol  (DIALYVITE VITAMIN D  5000) 125 MCG (5000 UT) capsule Take 5,000 Units by mouth daily.    [provider]  cyclobenzaprine  (FLEXERIL ) 10 MG tablet Take 1 tablet (10 mg total) by mouth 3 (three) times daily as needed for muscle spasms. 02/27/24  Jarold Medici, MD  lidocaine  (LIDODERM ) 5 % Place 1 patch onto the skin daily. Remove & Discard patch within 12 hours or as directed by MD 02/27/24   Jarold Medici, MD  nebivolol  (BYSTOLIC ) 5 MG tablet TAKE 1 TABLET DAILY 01/21/24   Jarold Medici, MD  ondansetron  (ZOFRAN -ODT) 4 MG disintegrating tablet Take 1 tablet (4 mg total) by mouth every 8 (eight) hours as needed for nausea or vomiting. 02/08/22   Jarold Medici, MD  pregabalin  (LYRICA ) 75 MG capsule TAKE 1 CAPSULE(75 MG) BY MOUTH DAILY 12/11/23   Jarold Medici, MD  rosuvastatin  (CRESTOR ) 10 MG  tablet TAKE 1 TABLET DAILY 05/08/23   Jarold Medici, MD  Semaglutide -Weight Management (WEGOVY ) 0.5 MG/0.5ML SOAJ Inject 0.5 mg into the skin once a week. 04/21/24   Jarold Medici, MD  SYSTANE 0.4-0.3 % GEL ophthalmic gel  12/13/21   [provider]  traMADol  (ULTRAM ) 50 MG tablet Take 50 mg by mouth as needed for moderate pain (pain score 4-6). 08/29/17   [provider]  triamcinolone  ointment (KENALOG ) 0.1 %     [provider]  triamterene -hydrochlorothiazide  (MAXZIDE -25) 37.5-25 MG tablet TAKE 1 TABLET DAILY 05/08/23   Jarold Medici, MD  zolpidem  (AMBIEN ) 5 MG tablet Take 1 tablet (5 mg total) by mouth at bedtime as needed. for sleep 02/27/24   Jarold Medici, MD                                                                                                                                    Allergies Aspirin , Ibuprofen, Quinapril hcl, Penicillin g, Quinapril, Penicillins, and Pravastatin  Review of Systems Review of Systems  Skin:  Positive for rash.   As noted in HPI  Physical Exam Vital Signs  I have reviewed the triage vital signs BP (!) 143/57 (BP Location: Left Arm)   Pulse 60   Temp 97.8 F (36.6 C) (Oral)   Resp 17   Ht 5' (1.524 m)   Wt 78.6 kg   SpO2 97%   BMI 33.82 kg/m   Physical Exam Vitals reviewed.  Constitutional:      General: She is not in acute distress.    Appearance: She is well-developed. She is not diaphoretic.  HENT:     Head: Normocephalic and atraumatic.      Nose: Nose normal.     Mouth/Throat:     Mouth: Angioedema present.  Eyes:     General: No scleral icterus.       Right eye: No discharge.        Left eye: No discharge.     Conjunctiva/sclera: Conjunctivae normal.     Pupils: Pupils are equal, round, and reactive to light.  Cardiovascular:     Rate and Rhythm: Normal rate and regular rhythm.     Heart sounds: No murmur heard.    No friction rub. No gallop.  Pulmonary:     Effort: Pulmonary  effort is  normal. No respiratory distress.     Breath sounds: Normal breath sounds. No stridor. No rales.  Abdominal:     General: There is no distension.     Palpations: Abdomen is soft.     Tenderness: There is no abdominal tenderness.  Musculoskeletal:        General: No tenderness.     Cervical back: Normal range of motion and neck supple.  Skin:    General: Skin is warm and dry.     Findings: Rash present. No erythema. Rash is papular.  Neurological:     Mental Status: She is alert and oriented to person, place, and time.     ED Results and Treatments Labs (all labs ordered are listed, but only abnormal results are displayed) Labs Reviewed - No data to display                                                                                                                       EKG  EKG Interpretation Date/Time:    Ventricular Rate:    PR Interval:    QRS Duration:    QT Interval:    QTC Calculation:   R Axis:      Text Interpretation:         Radiology No results found.  Medications Ordered in ED Medications - No data to display Procedures Procedures  (including critical care time) Medical Decision Making / ED Course   Medical Decision Making   73 y.o. female here with pruritic rash. No known triggers or exposures. No respiratory, GI, or neurologic symptoms to suggest anaphylaxis. Report recent infectious symptoms suggestive of possible viral urticaria.  Patient has not taken benadryl  prior to arrival.   Patient has not typical for shingles.  Additionally considering HSV with feel this is less likely.  On exam, there is no evidence of oral swelling or airway compromise.       Final Clinical Impression(s) / ED Diagnoses Final diagnoses:  Rash of face   The patient appears reasonably screened and/or stabilized for discharge and I doubt any other medical condition or other [facility] requiring further screening, evaluation, or treatment in the ED at this time. I have  discussed the findings, Dx and Tx plan with the patient/family who expressed understanding and agree(s) with the plan. Discharge instructions discussed at length. The patient/family was given strict return precautions who verbalized understanding of the instructions. No further questions at time of discharge.  Disposition: Discharge  Condition: Good  ED Discharge Orders     None        Follow Up: Jarold Medici, MD [address] [place] [address] [phone]  Call      This chart was dictated using voice recognition software.  Despite best efforts to proofread,  errors can occur which can change the documentation meaning.    Trine Raynell Moder, MD 05/06/24 [phone]

## 2024-05-06 NOTE — ED Notes (Signed)
 Patient d.c with home care instructions. Patient stated I feel better

## 2024-05-06 NOTE — ED Notes (Addendum)
 Patient denies pain and is resting comfortably.

## 2024-05-11 ENCOUNTER — Inpatient Hospital Stay: Payer: Self-pay | Admitting: Internal Medicine

## 2024-05-14 DIAGNOSIS — L218 Other seborrheic dermatitis: Secondary | ICD-10-CM | POA: Diagnosis not present

## 2024-05-14 DIAGNOSIS — L509 Urticaria, unspecified: Secondary | ICD-10-CM | POA: Diagnosis not present

## 2024-05-14 DIAGNOSIS — L810 Postinflammatory hyperpigmentation: Secondary | ICD-10-CM | POA: Diagnosis not present

## 2024-05-19 ENCOUNTER — Ambulatory Visit

## 2024-05-26 ENCOUNTER — Ambulatory Visit: Payer: Self-pay

## 2024-05-29 ENCOUNTER — Ambulatory Visit

## 2024-05-29 VITALS — BP 110/74 | HR 78 | Temp 98.1°F | Ht 60.0 in | Wt 172.4 lb

## 2024-05-29 DIAGNOSIS — L2389 Allergic contact dermatitis due to other agents: Secondary | ICD-10-CM | POA: Diagnosis not present

## 2024-05-29 DIAGNOSIS — E66811 Obesity, class 1: Secondary | ICD-10-CM

## 2024-05-29 NOTE — Progress Notes (Signed)
 Patient presents today for weight check. She reports compliance with Wegovy  0.5 weekly. Denies headache, chest pain & sob.  Filed Weights   05/29/24 1028  Weight: 172 lb 6.4 oz (78.2 kg)

## 2024-05-29 NOTE — Patient Instructions (Signed)

## 2024-06-08 ENCOUNTER — Other Ambulatory Visit: Payer: Self-pay | Admitting: Internal Medicine

## 2024-06-18 ENCOUNTER — Encounter: Payer: Self-pay | Admitting: Internal Medicine

## 2024-06-18 ENCOUNTER — Other Ambulatory Visit: Payer: Self-pay | Admitting: Internal Medicine

## 2024-06-18 ENCOUNTER — Ambulatory Visit: Payer: Self-pay | Admitting: Internal Medicine

## 2024-06-18 VITALS — BP 110/80 | HR 71 | Temp 98.2°F | Ht 60.0 in | Wt 169.0 lb

## 2024-06-18 DIAGNOSIS — I5032 Chronic diastolic (congestive) heart failure: Secondary | ICD-10-CM

## 2024-06-18 DIAGNOSIS — E6609 Other obesity due to excess calories: Secondary | ICD-10-CM

## 2024-06-18 DIAGNOSIS — I110 Hypertensive heart disease with heart failure: Secondary | ICD-10-CM

## 2024-06-18 DIAGNOSIS — I2510 Atherosclerotic heart disease of native coronary artery without angina pectoris: Secondary | ICD-10-CM | POA: Diagnosis not present

## 2024-06-18 DIAGNOSIS — Z6833 Body mass index (BMI) 33.0-33.9, adult: Secondary | ICD-10-CM

## 2024-06-18 DIAGNOSIS — E66811 Obesity, class 1: Secondary | ICD-10-CM

## 2024-06-18 MED ORDER — WEGOVY 1 MG/0.5ML ~~LOC~~ SOAJ: 1.0000 mg | SUBCUTANEOUS | 1 refills | Status: DC

## 2024-06-18 MED ORDER — WEGOVY 0.5 MG/0.5ML ~~LOC~~ SOAJ: 0.5000 mg | SUBCUTANEOUS | 1 refills | Status: DC

## 2024-06-18 NOTE — Patient Instructions (Addendum)
 Vital proteins- collagen powder  Exercising to Lose Weight Getting regular exercise is important for everyone. It is especially important if you are overweight. Being overweight increases your risk of heart disease, stroke, diabetes, high blood pressure, and several types of cancer. Exercising, and reducing the calories you consume, can help you lose weight and improve fitness and health. Exercise can be moderate or vigorous intensity. To lose weight, most people need to do a certain amount of moderate or vigorous-intensity exercise each week. How can exercise affect me? You lose weight when you exercise enough to burn more calories than you eat. Exercise also reduces body fat and builds muscle. The more muscle you have, the more calories you burn. Exercise also: Improves mood. Reduces stress and tension. Improves your overall fitness, flexibility, and endurance. Increases bone strength. Moderate-intensity exercise  Moderate-intensity exercise is any activity that gets you moving enough to burn at least three times more energy (calories) than if you were sitting. Examples of moderate exercise include: Walking a mile in 15 minutes. Doing light yard work. Biking at an easy pace. Most people should get at least 150 minutes of moderate-intensity exercise a week to maintain their body weight. Vigorous-intensity exercise Vigorous-intensity exercise is any activity that gets you moving enough to burn at least six times more calories than if you were sitting. When you exercise at this intensity, you should be working hard enough that you are not able to carry on a conversation. Examples of vigorous exercise include: Running. Playing a team sport, such as football, basketball, and soccer. Jumping rope. Most people should get at least 75 minutes a week of vigorous exercise to maintain their body weight. What actions can I take to lose weight? The amount of exercise you need to lose weight depends  on: Your age. The type of exercise. Any health conditions you have. Your overall physical ability. Talk to your health care provider about how much exercise you need and what types of activities are safe for you. Nutrition  Make changes to your diet as told by your health care provider or diet and nutrition specialist (dietitian). This may include: Eating fewer calories. Eating more protein. Eating less unhealthy fats. Eating a diet that includes fresh fruits and vegetables, whole grains, low-fat dairy products, and lean protein. Avoiding foods with added fat, salt, and sugar. Drink plenty of water  while you exercise to prevent dehydration or heat stroke. Activity Choose an activity that you enjoy and set realistic goals. Your health care provider can help you make an exercise plan that works for you. Exercise at a moderate or vigorous intensity most days of the week. The intensity of exercise may vary from person to person. You can tell how intense a workout is for you by paying attention to your breathing and heartbeat. Most people will notice their breathing and heartbeat get faster with more intense exercise. Do resistance training twice each week, such as: Push-ups. Sit-ups. Lifting weights. Using resistance bands. Getting short amounts of exercise can be just as helpful as long, structured periods of exercise. If you have trouble finding time to exercise, try doing these things as part of your daily routine: Get up, stretch, and walk around every 30 minutes throughout the day. Go for a walk during your lunch break. Park your car farther away from your destination. If you take public transportation, get off one stop early and walk the rest of the way. Make phone calls while standing up and walking around. Take the stairs  instead of elevators or escalators. Wear comfortable clothes and shoes with good support. Do not exercise so much that you hurt yourself, feel dizzy, or get very  short of breath. Where to find more information [place]. Department of Health and Human Services: ThisPath.fi Centers for Disease Control and Prevention: FootballExhibition.com.br Contact a health care provider: Before starting a new exercise program. If you have questions or concerns about your weight. If you have a medical problem that keeps you from exercising. Get help right away if: You have any of the following while exercising: Injury. Dizziness. Difficulty breathing or shortness of breath that does not go away when you stop exercising. Chest pain. Rapid heartbeat. These symptoms may represent a serious problem that is an emergency. Do not wait to see if the symptoms will go away. Get medical help right away. Call your local emergency services (911 in the [place] Do not drive yourself to the hospital. Summary Getting regular exercise is especially important if you are overweight. Being overweight increases your risk of heart disease, stroke, diabetes, high blood pressure, and several types of cancer. Losing weight happens when you burn more calories than you eat. Reducing the amount of calories you eat, and getting regular moderate or vigorous exercise each week, helps you lose weight. This information is not intended to replace advice given to you by your health care provider. Make sure you discuss any questions you have with your health care provider. Document Revised: 12/04/2020 Document Reviewed: 12/04/2020 Elsevier Patient Education  2024 [employer].

## 2024-06-18 NOTE — Progress Notes (Signed)
 I,[name], CMA,acting as a Neurosurgeon for [name], MD.,have documented all relevant documentation on the behalf of [name], MD,as directed by  [name], MD while in the presence of [name], MD.  Subjective:  Patient ID: [name] , female    DOB: 18-Dec-1950 , 73 y.o.   MRN: [id number]  Chief Complaint  Patient presents with   Weight Check    Patient presents today for weight check. She reports compliance with medication Wegovy . Denies headache, chest pain & sob.    HPI Discussed the use of AI scribe software for clinical note transcription with the patient, who gave verbal consent to proceed.  History of Present Illness [name] is a 73 year old female with hypertension and heart disease who presents for a weight check.  She is currently taking Wegovy , 0.5 mg, and is doing well with it. She is eating less but finds herself munching more. She is mindful of her protein intake to maintain muscle mass and engages in regular exercise, including chair [facility] She plans to start a new exercise routine with her daughter and granddaughter.  Her current weight is 169 pounds, and her goal weight is 153 pounds. She is concerned about losing too much weight and 'looking funny in the face', so she is focusing on incorporating healthy fats like guacamole and avocado into her diet. She is also considering adding collagen powder to her routine for skin and joint health.  No nausea. She has completed four weeks on the 0.5 mg dose of Wegovy  and is considering increasing to a 1 mg dose. She prefers to get her medication from [facility] for a 90-day supply due to cost efficiency.  She is staying hydrated but forgot her water  today.   Hypertension This is a chronic problem. The current episode started more than 1 year ago. The problem has been gradually improving since onset. The problem is controlled. Pertinent negatives include no  blurred vision. Risk factors for coronary artery disease include dyslipidemia, post-menopausal state, sedentary lifestyle and stress. Past treatments include beta blockers and diuretics. The current treatment provides moderate improvement.     Past Medical History:  Diagnosis Date   Aortic atherosclerosis (HCC) 04/16/2021   Arthritis    Cataract    Diverticulosis    H/O blood transfusion reaction 2010   hives   H/O migraine    last 87yrs ago   Headache(784.0)    Heart murmur    takes Bystolic  nightly   History of blood clots 22yrs   was on Lovenox  injections and Coumadin(was only on that for short period of time)   History of shingles 3-57yrs ago   Hx of seasonal allergies    takes Levocetirizine prn   Hyperlipidemia    takes Crestor  nightly   Hypertension    takes Maxzide  daily   Insomnia    takes Ambien  prn   Joint pain    Joint swelling    Moderate tricuspid regurgitation    Pre-diabetes    Trigeminal neuralgia of right side of face 03/26/2016   V2 distribution     Family History  Problem Relation Age of Onset   Heart attack Mother    Alzheimer's disease Mother    Diabetes Sister    Hypertension Sister      Current Outpatient Medications:    acetaminophen  (TYLENOL ) 325 MG tablet, Take 650 mg by mouth every 6 (six) hours as  needed for headache., Disp: , Rfl:    Cholecalciferol  (DIALYVITE VITAMIN D  5000) 125 MCG (5000 UT) capsule, Take 5,000 Units by mouth daily., Disp: , Rfl:    cyclobenzaprine  (FLEXERIL ) 10 MG tablet, Take 1 tablet (10 mg total) by mouth 3 (three) times daily as needed for muscle spasms., Disp: 30 tablet, Rfl: 0   lidocaine  (LIDODERM ) 5 %, Place 1 patch onto the skin daily. Remove & Discard patch within 12 hours or as directed by MD, Disp: 30 patch, Rfl: 0   nebivolol  (BYSTOLIC ) 5 MG tablet, TAKE 1 TABLET DAILY, Disp: 90 tablet, Rfl: 3   ondansetron  (ZOFRAN -ODT) 4 MG disintegrating tablet, Take 1 tablet (4 mg total) by mouth every 8 (eight) hours as  needed for nausea or vomiting., Disp: 20 tablet, Rfl: 0   pregabalin  (LYRICA ) 75 MG capsule, TAKE 1 CAPSULE(75 MG) BY MOUTH DAILY, Disp: 90 capsule, Rfl: 1   rosuvastatin  (CRESTOR ) 10 MG tablet, TAKE 1 TABLET DAILY, Disp: 90 tablet, Rfl: 3   semaglutide -weight management (WEGOVY ) 1 MG/0.5ML SOAJ SQ injection, Inject 1 mg into the skin once a week., Disp: 6 mL, Rfl: 1   SYSTANE 0.4-0.3 % GEL ophthalmic gel, , Disp: , Rfl:    traMADol  (ULTRAM ) 50 MG tablet, Take 50 mg by mouth as needed for moderate pain (pain score 4-6)., Disp: , Rfl:    triamcinolone  ointment (KENALOG ) 0.1 %, , Disp: , Rfl:    triamterene -hydrochlorothiazide  (MAXZIDE -25) 37.5-25 MG tablet, TAKE 1 TABLET DAILY, Disp: 90 tablet, Rfl: 3   zolpidem  (AMBIEN ) 5 MG tablet, Take 1 tablet (5 mg total) by mouth at bedtime as needed. for sleep, Disp: 60 tablet, Rfl: 1   Allergies  Allergen Reactions   Aspirin  Anaphylaxis and Other (See Comments)   Ibuprofen Hives, Swelling and Other (See Comments)   Quinapril Hcl Anaphylaxis   Penicillin G Other (See Comments)   Quinapril Other (See Comments)   Penicillins Hives, Swelling and Other (See Comments)    Has patient had a PCN reaction causing immediate rash, facial/tongue/throat swelling, SOB or lightheadedness with hypotension: Yes  Has patient had a PCN reaction causing severe rash involving mucus membranes or skin necrosis: No  Has patient had a PCN reaction that required hospitalization: No  Has patient had a PCN reaction occurring within the last 10 years: No  If all of the above answers are NO, then may proceed with Cephalosporin use.  Tolerated Cephalosporin Date: 12/13/20.   Pravastatin Rash     Review of Systems  Constitutional: Negative.   Eyes:  Negative for blurred vision.  Respiratory: Negative.    Cardiovascular: Negative.   Gastrointestinal: Negative.   Neurological: Negative.   Psychiatric/Behavioral: Negative.       Today's Vitals   06/18/24 1411  BP:  110/80  Pulse: 71  Temp: 98.2 F (36.8 C)  SpO2: 98%  Weight: 169 lb (76.7 kg)  Height: 5' (1.524 m)   Body mass index is 33.01 kg/m.  Wt Readings from Last 3 Encounters:  06/18/24 169 lb (76.7 kg)  05/29/24 172 lb 6.4 oz (78.2 kg)  05/05/24 173 lb 3.1 oz (78.6 kg)     Objective:  Physical Exam Vitals and nursing note reviewed.  Constitutional:      Appearance: Normal appearance.  HENT:     Head: Normocephalic and atraumatic.  Eyes:     Extraocular Movements: Extraocular movements intact.  Cardiovascular:     Rate and Rhythm: Normal rate and regular rhythm.     Heart sounds:  Normal heart sounds.  Pulmonary:     Effort: Pulmonary effort is normal.     Breath sounds: Normal breath sounds.  Musculoskeletal:     Cervical back: Normal range of motion.  Skin:    General: Skin is warm.  Neurological:     General: No focal deficit present.     Mental Status: She is alert.  Psychiatric:        Mood and Affect: Mood normal.        Behavior: Behavior normal.       Assessment And Plan:  Class 1 obesity due to excess calories with serious comorbidity and body mass index (BMI) of 33.0 to 33.9 in adult Assessment & Plan: Obesity management with Wegovy . Current weight 169 lbs, goal 153 lbs. On 0.5 mg Wegovy , considering 1 mg. Discussed dietary habits, protein intake, healthy fats, exercise, and collagen supplementation. Opted to increase Wegovy  to 1 mg. - Increase Wegovy  dose to 1 mg, send prescription to [facility] for 90-day supply. - Encourage protein intake to prevent muscle mass loss. - Encourage intake of healthy fats such as guacamole and avocado. - Recommend collagen powder supplementation, such as Vital Proteins, to be added to coffee or smoothies. - Encourage exercise, aiming for at least 30 minutes, five days a week.   Hypertensive heart disease with chronic diastolic congestive heart failure (HCC) Assessment & Plan: Chronic, well controlled.  She will  continue with nebivolol  5mg  daily and Maxzide  37.5/25mg  daily. Encouraged to follow low sodium diet.    Coronary artery disease involving native coronary artery of native heart without angina pectoris Assessment & Plan: Coronary calcifications noted on CT scan. She has been evaluated by Cardiology, LDL goal is less than 70. She will continue with statin therapy.  - Continue with weekly semaglutide , plan to increase to semaglutide  1mg  weekly - F/u in 8-10 weeks   Other orders -     Wegovy ; Inject 1 mg into the skin once a week.  Dispense: 6 mL; Refill: 1   Return for 2 month weight f/u. sch flu shot. .  Patient was given opportunity to ask questions. Patient verbalized understanding of the plan and was able to repeat key elements of the plan. All questions were answered to their satisfaction.   I, [name], MD, have reviewed all documentation for this visit. The documentation on 06/18/24 for the exam, diagnosis, procedures, and orders are all accurate and complete.   IF YOU HAVE BEEN REFERRED TO A SPECIALIST, IT MAY TAKE 1-2 WEEKS TO SCHEDULE/PROCESS THE REFERRAL. IF YOU HAVE NOT HEARD FROM US /SPECIALIST IN TWO WEEKS, PLEASE GIVE US  A CALL AT [phone].   THE PATIENT IS ENCOURAGED TO PRACTICE SOCIAL DISTANCING DUE TO THE COVID-19 PANDEMIC.

## 2024-06-20 NOTE — Assessment & Plan Note (Signed)
 Chronic, well controlled.  She will continue with nebivolol 5mg  daily and Maxzide 37.5/25mg  daily. Encouraged to follow low sodium diet.

## 2024-06-20 NOTE — Assessment & Plan Note (Signed)
 Obesity management with Wegovy . Current weight 169 lbs, goal 153 lbs. On 0.5 mg Wegovy , considering 1 mg. Discussed dietary habits, protein intake, healthy fats, exercise, and collagen supplementation. Opted to increase Wegovy  to 1 mg. - Increase Wegovy  dose to 1 mg, send prescription to [facility] for 90-day supply. - Encourage protein intake to prevent muscle mass loss. - Encourage intake of healthy fats such as guacamole and avocado. - Recommend collagen powder supplementation, such as Vital Proteins, to be added to coffee or smoothies. - Encourage exercise, aiming for at least 30 minutes, five days a week.

## 2024-06-20 NOTE — Assessment & Plan Note (Addendum)
 Coronary calcifications noted on CT scan. She has been evaluated by Cardiology, LDL goal is less than 70. She will continue with statin therapy.  - Continue with weekly semaglutide , plan to increase to semaglutide  1mg  weekly - F/u in 8-10 weeks

## 2024-06-22 ENCOUNTER — Encounter: Payer: Self-pay | Admitting: Internal Medicine

## 2024-06-23 ENCOUNTER — Other Ambulatory Visit: Payer: Self-pay | Admitting: Internal Medicine

## 2024-06-23 MED ORDER — ZOLPIDEM TARTRATE 5 MG PO TABS: 5.0000 mg | ORAL_TABLET | Freq: Every evening | ORAL | 1 refills | Status: DC | PRN

## 2024-06-23 MED ORDER — PREGABALIN 75 MG PO CAPS: 75.0000 mg | ORAL_CAPSULE | Freq: Every day | ORAL | 1 refills | Status: AC

## 2024-07-01 ENCOUNTER — Ambulatory Visit: Payer: Self-pay | Admitting: *Deleted

## 2024-07-01 NOTE — Telephone Encounter (Signed)
 FYI Only or Action Required?: FYI only for provider.  Patient was last seen in primary care on 06/18/2024 by Veronica Medici, MD.  Called Nurse Triage reporting Otalgia.  Symptoms began several days ago.  Interventions attempted: Rest, hydration, or home remedies.  Symptoms are: gradually worsening.  Triage Disposition: See Physician Within 24 Hours  Patient/caregiver understands and will follow disposition?: Yes                   Copied from CRM #[id number]. Topic: Clinical - Red Word Triage >> Jul 01, 2024  3:58 PM [name] wrote: Red Word that prompted transfer to Nurse Triage: Pt has an ear ache(left ear) its causing pain and dizziness. Reason for Disposition  Earache  (Exceptions: Brief ear pain of lasting less than 60 minutes, or earache occurring during air travel.)  Answer Assessment - Initial Assessment Questions Appt scheduled 07/03/24 earliest available . Recommended mobile bus if wanted to be seen sooner.              1. LOCATION: Which ear is involved?     Left ear 2. ONSET: When did the ear pain start?      After Monday  3. SEVERITY: How bad is the pain?  (Scale 1-10; mild, moderate or severe)     5/10 4. URI SYMPTOMS: Do you have a runny nose or cough?     Sniffles  5. FEVER: Do you have a fever? If Yes, ask: What is your temperature, how was it measured, and when did it start?     na 6. CAUSE: Have you been swimming recently?, How often do you use Q-TIPS?, Have you had any recent air travel or scuba diving?     Na  7. OTHER SYMPTOMS: Do you have any other symptoms? (e.g., decreased hearing, dizziness, headache, stiff neck, vomiting)     Decreased hearing, dizziness, left side neck stiffness, has been on plane Monday  8. PREGNANCY: Is there any chance you are pregnant? When was your last menstrual period?     na  Protocols used: [name]

## 2024-07-03 ENCOUNTER — Ambulatory Visit

## 2024-07-03 ENCOUNTER — Ambulatory Visit: Payer: Self-pay | Admitting: Family Medicine

## 2024-07-03 ENCOUNTER — Encounter: Payer: Self-pay | Admitting: Family Medicine

## 2024-07-03 VITALS — BP 100/60 | HR 78 | Temp 98.3°F | Ht 60.0 in | Wt 169.4 lb

## 2024-07-03 DIAGNOSIS — Z6833 Body mass index (BMI) 33.0-33.9, adult: Secondary | ICD-10-CM

## 2024-07-03 DIAGNOSIS — E6609 Other obesity due to excess calories: Secondary | ICD-10-CM

## 2024-07-03 DIAGNOSIS — J3089 Other allergic rhinitis: Secondary | ICD-10-CM | POA: Diagnosis not present

## 2024-07-03 DIAGNOSIS — Z23 Encounter for immunization: Secondary | ICD-10-CM

## 2024-07-03 DIAGNOSIS — H9201 Otalgia, right ear: Secondary | ICD-10-CM

## 2024-07-03 DIAGNOSIS — Z2821 Immunization not carried out because of patient refusal: Secondary | ICD-10-CM

## 2024-07-03 DIAGNOSIS — E66811 Obesity, class 1: Secondary | ICD-10-CM

## 2024-07-03 MED ORDER — ACETIC ACID 2 % OT SOLN: 2.0000 [drp] | Freq: Three times a day (TID) | OTIC | 0 refills | Status: AC

## 2024-07-03 MED ORDER — COVID-19 MRNA VAC-TRIS(PFIZER) 30 MCG/0.3ML IM SUSY: 0.3000 mL | PREFILLED_SYRINGE | Freq: Once | INTRAMUSCULAR | 0 refills | Status: AC

## 2024-07-03 MED ORDER — CETIRIZINE HCL 10 MG PO TABS: 10.0000 mg | ORAL_TABLET | Freq: Every day | ORAL | 1 refills | Status: AC

## 2024-07-03 NOTE — Progress Notes (Signed)
 LILLETTE Kristeen JINNY Gladis, CMA,acting as a Neurosurgeon for Bruna Creighton, NP.,have documented all relevant documentation on the behalf of Bruna Creighton, NP,as directed by  Bruna Creighton, NP while in the presence of Bruna Creighton, NP.  Subjective:  Patient ID: [name] , female    DOB: September 01, 1951 , 73 y.o.   MRN: [id number]  Chief Complaint  Patient presents with   Otalgia    Patient presents today for right ear pain, patient reports her ear pain started Monday night.     HPI Discussed the use of AI scribe software for clinical note transcription with the patient, who gave verbal consent to proceed.  History of Present Illness     [name] is a 73 year old female who presents with right ear pain and associated symptoms.  She has been experiencing right ear pain described as throbbing, with a sensation of being in a tunnel when she talks. The pain radiates down the side of her face and began on Monday night or Tuesday morning. She rates the pain as 5 out of 10.  No fever at home, but she is unsure if she had one earlier. She reports postnasal drainage and chills since the onset of her ear symptoms. No cold or cough is present.  No dizziness or lightheadedness all the time since the symptoms started. She does not feel she has lost any hearing ability.  She has taken Benadryl  without significant relief. Will prescribe generic Zyrtec  for allergy symptoms.  She is encouraged to strive for BMI less than 30 to decrease cardiac risk. Advised to aim for at least 150 minutes of exercise per week.       Past Medical History:  Diagnosis Date   Aortic atherosclerosis (HCC) 04/16/2021   Arthritis    Cataract    Diverticulosis    H/O blood transfusion reaction 2010   hives   H/O migraine    last 1yrs ago   Headache(784.0)    Heart murmur    takes Bystolic  nightly   History of blood clots 82yrs   was on Lovenox  injections and Coumadin(was only on that for short period of time)   History of  shingles 3-54yrs ago   Hx of seasonal allergies    takes Levocetirizine prn   Hyperlipidemia    takes Crestor  nightly   Hypertension    takes Maxzide  daily   Insomnia    takes Ambien  prn   Joint pain    Joint swelling    Moderate tricuspid regurgitation    Pre-diabetes    Trigeminal neuralgia of right side of face 03/26/2016   V2 distribution     Family History  Problem Relation Age of Onset   Heart attack Mother    Alzheimer's disease Mother    Diabetes Sister    Hypertension Sister      Current Outpatient Medications:    acetaminophen  (TYLENOL ) 325 MG tablet, Take 650 mg by mouth every 6 (six) hours as needed for headache., Disp: , Rfl:    acetic acid  2 % otic solution, Place 2 drops into the right ear 3 (three) times daily., Disp: 15 mL, Rfl: 0   cetirizine  (ZYRTEC  ALLERGY) 10 MG tablet, Take 1 tablet (10 mg total) by mouth daily., Disp: 90 tablet, Rfl: 1   Cholecalciferol  (DIALYVITE VITAMIN D  5000) 125 MCG (5000 UT) capsule, Take 5,000 Units by mouth daily., Disp: , Rfl:    COVID-19 mRNA vaccine, Pfizer, (COMIRNATY) syringe, Inject 0.3 mLs into the muscle once  for 1 dose., Disp: 0.3 mL, Rfl: 0   cyclobenzaprine  (FLEXERIL ) 10 MG tablet, Take 1 tablet (10 mg total) by mouth 3 (three) times daily as needed for muscle spasms., Disp: 30 tablet, Rfl: 0   lidocaine  (LIDODERM ) 5 %, Place 1 patch onto the skin daily. Remove & Discard patch within 12 hours or as directed by MD, Disp: 30 patch, Rfl: 0   nebivolol  (BYSTOLIC ) 5 MG tablet, TAKE 1 TABLET DAILY, Disp: 90 tablet, Rfl: 3   ondansetron  (ZOFRAN -ODT) 4 MG disintegrating tablet, Take 1 tablet (4 mg total) by mouth every 8 (eight) hours as needed for nausea or vomiting., Disp: 20 tablet, Rfl: 0   pregabalin  (LYRICA ) 75 MG capsule, Take 1 capsule (75 mg total) by mouth daily., Disp: 90 capsule, Rfl: 1   rosuvastatin  (CRESTOR ) 10 MG tablet, TAKE 1 TABLET DAILY, Disp: 90 tablet, Rfl: 3   semaglutide -weight management (WEGOVY ) 1 MG/0.5ML  SOAJ SQ injection, Inject 1 mg into the skin once a week., Disp: 6 mL, Rfl: 1   SYSTANE 0.4-0.3 % GEL ophthalmic gel, , Disp: , Rfl:    traMADol  (ULTRAM ) 50 MG tablet, Take 50 mg by mouth as needed for moderate pain (pain score 4-6)., Disp: , Rfl:    triamcinolone  ointment (KENALOG ) 0.1 %, , Disp: , Rfl:    triamterene -hydrochlorothiazide  (MAXZIDE -25) 37.5-25 MG tablet, TAKE 1 TABLET DAILY, Disp: 90 tablet, Rfl: 3   zolpidem  (AMBIEN ) 5 MG tablet, Take 1 tablet (5 mg total) by mouth at bedtime as needed. for sleep, Disp: 60 tablet, Rfl: 1   Allergies  Allergen Reactions   Aspirin  Anaphylaxis and Other (See Comments)   Ibuprofen Hives, Swelling and Other (See Comments)   Quinapril Hcl Anaphylaxis   Penicillin G Other (See Comments)   Quinapril Other (See Comments)   Penicillins Hives, Swelling and Other (See Comments)    Has patient had a PCN reaction causing immediate rash, facial/tongue/throat swelling, SOB or lightheadedness with hypotension: Yes  Has patient had a PCN reaction causing severe rash involving mucus membranes or skin necrosis: No  Has patient had a PCN reaction that required hospitalization: No  Has patient had a PCN reaction occurring within the last 10 years: No  If all of the above answers are NO, then may proceed with Cephalosporin use.  Tolerated Cephalosporin Date: 12/13/20.   Pravastatin Rash     Review of Systems  Constitutional:  Positive for chills.  HENT:  Positive for ear pain and postnasal drip.   Respiratory: Negative.    Cardiovascular: Negative.   Gastrointestinal: Negative.   Neurological: Negative.      Today's Vitals   07/03/24 0939  BP: 100/60  Pulse: 78  Temp: 98.3 F (36.8 C)  TempSrc: Oral  Weight: 169 lb 6.4 oz (76.8 kg)  Height: 5' (1.524 m)  PainSc: 5   PainLoc: Ear   Body mass index is 33.08 kg/m.  Wt Readings from Last 3 Encounters:  07/03/24 169 lb 6.4 oz (76.8 kg)  06/18/24 169 lb (76.7 kg)  05/29/24 172 lb 6.4 oz  (78.2 kg)    The 10-year ASCVD risk score (Arnett DK, et al., 2019) is: 7.6%   Values used to calculate the score:     Age: 68 years     Clincally relevant sex: Female     Is Non-Hispanic African American: Yes     Diabetic: No     Tobacco smoker: No     Systolic Blood Pressure: 100 mmHg  Is BP treated: Yes     HDL Cholesterol: 61 mg/dL     Total Cholesterol: 140 mg/dL  Objective:  Physical Exam HENT:     Head: Normocephalic.     Right Ear: Hearing normal. No decreased hearing noted. Tenderness present. No swelling. No foreign body. Tympanic membrane is not erythematous.     Left Ear: Hearing normal. No decreased hearing noted. No swelling. No foreign body. Tympanic membrane is not erythematous.  Cardiovascular:     Rate and Rhythm: Normal rate.  Pulmonary:     Effort: Pulmonary effort is normal.     Breath sounds: Normal breath sounds.  Neurological:     Mental Status: She is alert.         Assessment And Plan:  Right ear pain  COVID-19 vaccination declined -     COVID-19 mRNA Vac-TriS(Pfizer); Inject 0.3 mLs into the muscle once for 1 dose.  Dispense: 0.3 mL; Refill: 0  Otalgia of right ear -     Acetic Acid ; Place 2 drops into the right ear 3 (three) times daily.  Dispense: 15 mL; Refill: 0  Need for influenza vaccination -     Flu vaccine HIGH DOSE PF(Fluzone Trivalent)  Class 1 obesity due to excess calories with serious comorbidity and body mass index (BMI) of 33.0 to 33.9 in adult  Environmental and seasonal allergies -     Cetirizine  HCl; Take 1 tablet (10 mg total) by mouth daily.  Dispense: 90 tablet; Refill: 1    Assessment & Plan Acute right ear pain Acute right ear pain with throbbing and tunnel-like hearing. Possible sinus infection due to ENT symptom connection. - Prescribe generic Zyrtec  for allergy symptom management.   Return for keep same next.  Patient was given opportunity to ask questions. Patient verbalized understanding of the plan and  was able to repeat key elements of the plan. All questions were answered to their satisfaction.    I, Bruna Creighton, NP, have reviewed all documentation for this visit. The documentation on 07/03/24 for the exam, diagnosis, procedures, and orders are all accurate and complete.   IF YOU HAVE BEEN REFERRED TO A SPECIALIST, IT MAY TAKE 1-2 WEEKS TO SCHEDULE/PROCESS THE REFERRAL. IF YOU HAVE NOT HEARD FROM US /SPECIALIST IN TWO WEEKS, PLEASE GIVE US  A CALL AT [phone].

## 2024-07-08 ENCOUNTER — Encounter: Payer: Self-pay | Admitting: Internal Medicine

## 2024-07-08 ENCOUNTER — Ambulatory Visit (INDEPENDENT_AMBULATORY_CARE_PROVIDER_SITE_OTHER): Admitting: Internal Medicine

## 2024-07-08 VITALS — BP 110/80 | HR 65 | Temp 98.1°F | Ht 60.0 in | Wt 170.6 lb

## 2024-07-08 DIAGNOSIS — I110 Hypertensive heart disease with heart failure: Secondary | ICD-10-CM

## 2024-07-08 DIAGNOSIS — H9201 Otalgia, right ear: Secondary | ICD-10-CM | POA: Diagnosis not present

## 2024-07-08 DIAGNOSIS — I5032 Chronic diastolic (congestive) heart failure: Secondary | ICD-10-CM

## 2024-07-08 DIAGNOSIS — I2510 Atherosclerotic heart disease of native coronary artery without angina pectoris: Secondary | ICD-10-CM

## 2024-07-08 DIAGNOSIS — E6609 Other obesity due to excess calories: Secondary | ICD-10-CM

## 2024-07-08 MED ORDER — AZITHROMYCIN 250 MG PO TABS: ORAL_TABLET | ORAL | 0 refills | Status: AC

## 2024-07-08 NOTE — Progress Notes (Signed)
 I,[name], CMA,acting as a Neurosurgeon for [name], MD.,have documented all relevant documentation on the behalf of [name], MD,as directed by  [name], MD while in the presence of [name], MD.  Subjective:  Patient ID: [name] , female    DOB: Apr 01, 1951 , 73 y.o.   MRN: [id number]  Chief Complaint  Patient presents with   Facial Pain    Patient presents today for pain on the right side of her face. She was seen by Bruna Creighton on 9/12 for right ear pain. This initially started last Monday.  She was given Zyrtec  & ear drops for right ear pain. Which has not helped. She feels this is progressing to being worse. She also experiences chills. Denies fever.     HPI Discussed the use of AI scribe software for clinical note transcription with the patient, who gave verbal consent to proceed.  History of Present Illness [name] is a 73 year old female who presents with worsening right ear pain. She was seen last Friday by NP; however, she feels more comfortable coming in today for an examination.   She describes a dull ache in her right ear that began a few days ago and has progressively worsened, with a pain intensity of 5 to 6 out of 10. She experiences chills but no fever. There is drainage from the ear, and ear drops have not provided relief.  She has a known allergy to penicillin, which causes rashes and lip swelling. She has been taking Zyrtec  prior to the onset of her ear pain. She recalls taking a Z-Pak in the past, which involves taking two pills on the first day followed by one pill daily.  She has a runny nose but no cough, sore throat, or need to clear her throat. She has not been swimming recently.  Due to discomfort, she has been consuming mostly soup and has not been chewing much. Prior to the onset of symptoms, she was eating normal food.   Otalgia  There is pain in the right ear. The current episode started in the past 7 days.  The problem has been gradually worsening. There has been no fever. The pain is at a severity of 6/10. The pain is moderate. Associated symptoms include ear discharge and rhinorrhea. Pertinent negatives include no coughing.     Past Medical History:  Diagnosis Date   Aortic atherosclerosis (HCC) 04/16/2021   Arthritis    Cataract    Diverticulosis    H/O blood transfusion reaction 2010   hives   H/O migraine    last 65yrs ago   Headache(784.0)    Heart murmur    takes Bystolic  nightly   History of blood clots 33yrs   was on Lovenox  injections and Coumadin(was only on that for short period of time)   History of shingles 3-30yrs ago   Hx of seasonal allergies    takes Levocetirizine prn   Hyperlipidemia    takes Crestor  nightly   Hypertension    takes Maxzide  daily   Insomnia    takes Ambien  prn   Joint pain    Joint swelling    Moderate tricuspid regurgitation    Pre-diabetes    Trigeminal neuralgia of right side of face 03/26/2016   V2 distribution     Family History  Problem Relation Age of Onset   Heart attack Mother    Alzheimer's disease Mother    Diabetes Sister  Hypertension Sister      Current Outpatient Medications:    acetaminophen  (TYLENOL ) 325 MG tablet, Take 650 mg by mouth every 6 (six) hours as needed for headache., Disp: , Rfl:    acetic acid  2 % otic solution, Place 2 drops into the right ear 3 (three) times daily., Disp: 15 mL, Rfl: 0   azithromycin  (ZITHROMAX  Z-PAK) 250 MG tablet, Take 2 tablets (500 mg) on  Day 1,  followed by 1 tablet (250 mg) once daily on Days 2 through 5., Disp: 6 each, Rfl: 0   cetirizine  (ZYRTEC  ALLERGY) 10 MG tablet, Take 1 tablet (10 mg total) by mouth daily., Disp: 90 tablet, Rfl: 1   Cholecalciferol  (DIALYVITE VITAMIN D  5000) 125 MCG (5000 UT) capsule, Take 5,000 Units by mouth daily., Disp: , Rfl:    cyclobenzaprine  (FLEXERIL ) 10 MG tablet, Take 1 tablet (10 mg total) by mouth 3 (three) times daily as needed for muscle  spasms., Disp: 30 tablet, Rfl: 0   lidocaine  (LIDODERM ) 5 %, Place 1 patch onto the skin daily. Remove & Discard patch within 12 hours or as directed by MD, Disp: 30 patch, Rfl: 0   nebivolol  (BYSTOLIC ) 5 MG tablet, TAKE 1 TABLET DAILY, Disp: 90 tablet, Rfl: 3   ondansetron  (ZOFRAN -ODT) 4 MG disintegrating tablet, Take 1 tablet (4 mg total) by mouth every 8 (eight) hours as needed for nausea or vomiting., Disp: 20 tablet, Rfl: 0   pregabalin  (LYRICA ) 75 MG capsule, Take 1 capsule (75 mg total) by mouth daily., Disp: 90 capsule, Rfl: 1   rosuvastatin  (CRESTOR ) 10 MG tablet, TAKE 1 TABLET DAILY, Disp: 90 tablet, Rfl: 3   semaglutide -weight management (WEGOVY ) 1 MG/0.5ML SOAJ SQ injection, Inject 1 mg into the skin once a week., Disp: 6 mL, Rfl: 1   SYSTANE 0.4-0.3 % GEL ophthalmic gel, , Disp: , Rfl:    traMADol  (ULTRAM ) 50 MG tablet, Take 50 mg by mouth as needed for moderate pain (pain score 4-6)., Disp: , Rfl:    triamcinolone  ointment (KENALOG ) 0.1 %, , Disp: , Rfl:    triamterene -hydrochlorothiazide  (MAXZIDE -25) 37.5-25 MG tablet, TAKE 1 TABLET DAILY, Disp: 90 tablet, Rfl: 3   zolpidem  (AMBIEN ) 5 MG tablet, Take 1 tablet (5 mg total) by mouth at bedtime as needed. for sleep, Disp: 60 tablet, Rfl: 1   Allergies  Allergen Reactions   Aspirin  Anaphylaxis and Other (See Comments)   Ibuprofen Hives, Swelling and Other (See Comments)   Quinapril Hcl Anaphylaxis   Penicillin G Other (See Comments)   Quinapril Other (See Comments)   Penicillins Hives, Swelling and Other (See Comments)    Has patient had a PCN reaction causing immediate rash, facial/tongue/throat swelling, SOB or lightheadedness with hypotension: Yes  Has patient had a PCN reaction causing severe rash involving mucus membranes or skin necrosis: No  Has patient had a PCN reaction that required hospitalization: No  Has patient had a PCN reaction occurring within the last 10 years: No  If all of the above answers are NO, then  may proceed with Cephalosporin use.  Tolerated Cephalosporin Date: 12/13/20.   Pravastatin Rash     Review of Systems  Constitutional: Negative.   HENT:  Positive for ear discharge, ear pain and rhinorrhea.   Respiratory: Negative.  Negative for cough.   Cardiovascular: Negative.   Gastrointestinal: Negative.   Neurological: Negative.   Psychiatric/Behavioral: Negative.       Today's Vitals   07/08/24 1623  BP: 110/80  Pulse: 65  Temp: 98.1 F (36.7 C)  SpO2: 98%  Weight: 170 lb 9.6 oz (77.4 kg)  Height: 5' (1.524 m)   Body mass index is 33.32 kg/m.  Wt Readings from Last 3 Encounters:  07/08/24 170 lb 9.6 oz (77.4 kg)  07/03/24 169 lb 6.4 oz (76.8 kg)  06/18/24 169 lb (76.7 kg)     Objective:  Physical Exam HENT:     Head: Normocephalic.     Right Ear: Hearing normal. No decreased hearing noted. Tenderness present. No swelling. No foreign body. Tympanic membrane is not erythematous.     Left Ear: Hearing normal. No decreased hearing noted. No swelling. No foreign body. Tympanic membrane is not erythematous.  Cardiovascular:     Rate and Rhythm: Normal rate.  Pulmonary:     Effort: Pulmonary effort is normal.     Breath sounds: Normal breath sounds.  Neurological:     Mental Status: She is alert.         Assessment And Plan:  Right ear pain Assessment & Plan: Worsening right ear pain with drainage and cerumen obstruction, limiting tympanic membrane visualization. No improvement with current ear drops. - Provide Norel samples for decongestion, to be taken as needed, especially at night. - Advise hydration and to report palpitations while on decongestants. - Schedule follow-up in two weeks to reassess and potentially flush ears if infection resolves.   Hypertensive heart disease with chronic diastolic congestive heart failure (HCC) Assessment & Plan: Chronic, well controlled.  She will continue with nebivolol  5mg  daily and Maxzide  37.5/25mg  daily.  Encouraged to follow low sodium diet.  - She was given samples of Norel AD to treat her ear pain, this should not, but could aggravate her BP   Coronary artery disease involving native coronary artery of native heart without angina pectoris Assessment & Plan: Coronary calcifications noted on CT scan. She has been evaluated by Cardiology, LDL goal is less than 70. She will continue with statin therapy.  - Continue with weekly semaglutide , plan to increase to semaglutide  1mg  weekly - F/u in 8-10 weeks   Other orders -     Azithromycin ; Take 2 tablets (500 mg) on  Day 1,  followed by 1 tablet (250 mg) once daily on Days 2 through 5.  Dispense: 6 each; Refill: 0   Return in 2 weeks (on 07/22/2024), or w/ rs - ear flush.  Patient was given opportunity to ask questions. Patient verbalized understanding of the plan and was able to repeat key elements of the plan. All questions were answered to their satisfaction.   I, [name], MD, have reviewed all documentation for this visit. The documentation on 07/08/24 for the exam, diagnosis, procedures, and orders are all accurate and complete.   IF YOU HAVE BEEN REFERRED TO A SPECIALIST, IT MAY TAKE 1-2 WEEKS TO SCHEDULE/PROCESS THE REFERRAL. IF YOU HAVE NOT HEARD FROM US /SPECIALIST IN TWO WEEKS, PLEASE GIVE US  A CALL AT [phone].   THE PATIENT IS ENCOURAGED TO PRACTICE SOCIAL DISTANCING DUE TO THE COVID-19 PANDEMIC.

## 2024-07-09 ENCOUNTER — Encounter: Payer: Self-pay | Admitting: Internal Medicine

## 2024-07-10 ENCOUNTER — Ambulatory Visit

## 2024-07-12 NOTE — Assessment & Plan Note (Signed)
 Coronary calcifications noted on CT scan. She has been evaluated by Cardiology, LDL goal is less than 70. She will continue with statin therapy.  - Continue with weekly semaglutide , plan to increase to semaglutide  1mg  weekly - F/u in 8-10 weeks

## 2024-07-12 NOTE — Assessment & Plan Note (Signed)
 Worsening right ear pain with drainage and cerumen obstruction, limiting tympanic membrane visualization. No improvement with current ear drops. - Provide Norel samples for decongestion, to be taken as needed, especially at night. - Advise hydration and to report palpitations while on decongestants. - Schedule follow-up in two weeks to reassess and potentially flush ears if infection resolves.

## 2024-07-12 NOTE — Patient Instructions (Signed)
Earache, Adult An earache, or ear pain, can be caused by many things, including: An infection. Ear wax buildup. Ear pressure. Something in the ear that should not be there (foreign body). A sore throat. Tooth problems. Jaw problems. Treatment of the earache will depend on the cause. If the cause is not clear or cannot be known, you may need to watch your symptoms until your earache goes away or until a cause is found. Follow these instructions at home: Medicines Take or apply over-the-counter and prescription medicines only as told by your health care provider. If you were prescribed antibiotics, use them as told by your health care provider. Do not stop using the antibiotic even if you start to feel better. Do not put anything in your ear other than medicine that is prescribed by your health care provider. Managing pain     If directed, apply heat to the affected area as often as told by your health care provider. Use the heat source that your health care provider recommends, such as a moist heat pack or a heating pad. Place a towel between your skin and the heat source. Leave the heat on for 20-30 minutes. If your skin turns bright red, remove the heat right away to prevent burns. The risk of burns is higher if you cannot feel pain, heat, or cold. If directed, put ice on the affected area. To do this: Put ice in a plastic bag. Place a towel between your skin and the bag. Leave the ice on for 20 minutes, 2-3 times a day. If your skin turns bright red, remove the ice right away to prevent skin damage. The risk of skin damage is higher if you cannot feel pain, heat, or cold.  General instructions Pay attention to any changes in your symptoms. Try resting in an upright position instead of lying down. This may help to reduce pressure in your ear and relieve pain. Chew gum if it helps to relieve your ear pain. Treat any allergies as told by your health care provider. Drink enough fluid  to keep your urine pale yellow. It is up to you to get the results of any tests that were done. Ask your health care provider, or the department that is doing the tests, when your results will be ready. Contact a health care provider if: Your pain does not improve within 2 days. Your earache gets worse. You have new symptoms. You have a fever. Get help right away if: You have a severe headache. You have a stiff neck. You have trouble swallowing. You have redness or swelling behind your ear. You have fluid or blood coming from your ear. You have hearing loss. You feel dizzy. This information is not intended to replace advice given to you by your health care provider. Make sure you discuss any questions you have with your health care provider. Document Revised: 02/19/2022 Document Reviewed: 02/19/2022 Elsevier Patient Education  2024 [employer]  

## 2024-07-12 NOTE — Assessment & Plan Note (Addendum)
 Chronic, well controlled.  She will continue with nebivolol  5mg  daily and Maxzide  37.5/25mg  daily. Encouraged to follow low sodium diet.  - She was given samples of Norel AD to treat her ear pain, this should not, but could aggravate her BP

## 2024-07-22 ENCOUNTER — Encounter: Payer: Self-pay | Admitting: Internal Medicine

## 2024-07-22 ENCOUNTER — Ambulatory Visit: Admitting: Internal Medicine

## 2024-07-22 VITALS — BP 110/68 | HR 59 | Temp 98.1°F | Ht 60.0 in | Wt 171.8 lb

## 2024-07-22 DIAGNOSIS — H6123 Impacted cerumen, bilateral: Secondary | ICD-10-CM

## 2024-07-22 DIAGNOSIS — E66811 Obesity, class 1: Secondary | ICD-10-CM

## 2024-07-22 DIAGNOSIS — R0982 Postnasal drip: Secondary | ICD-10-CM

## 2024-07-22 DIAGNOSIS — J309 Allergic rhinitis, unspecified: Secondary | ICD-10-CM | POA: Diagnosis not present

## 2024-07-22 DIAGNOSIS — Z6833 Body mass index (BMI) 33.0-33.9, adult: Secondary | ICD-10-CM | POA: Diagnosis not present

## 2024-07-22 DIAGNOSIS — E6609 Other obesity due to excess calories: Secondary | ICD-10-CM | POA: Diagnosis not present

## 2024-07-22 NOTE — Progress Notes (Signed)
 I,[name], CMA,acting as a Neurosurgeon for Veronica LOISE Slocumb, MD.,have documented all relevant documentation on the behalf of Veronica LOISE Slocumb, MD,as directed by  Veronica LOISE Slocumb, MD while in the presence of Veronica LOISE Slocumb, MD.  Subjective:  Patient ID: [name] , female    DOB: 16-Dec-1950 , 73 y.o.   MRN: [id number]  Chief Complaint  Patient presents with  . Ear Flush     Patient presents today for ear flush. This is a follow up from prev visit. She has finished course of antibiotics. She states her ears feel fine today.    HPI Discussed the use of AI scribe software for clinical note transcription with the patient, who gave verbal consent to proceed.  History of Present Illness    Hypertension This is a chronic problem. The current episode started more than 1 year ago. The problem has been gradually improving since onset. The problem is controlled. Pertinent negatives include no blurred vision. Risk factors for coronary artery disease include dyslipidemia, post-menopausal state, sedentary lifestyle and stress. Past treatments include beta blockers and diuretics. The current treatment provides moderate improvement.  Otalgia  There is pain in the right ear. The current episode started in the past 7 days. The problem has been gradually worsening. There has been no fever. The pain is at a severity of 6/10. The pain is moderate. Associated symptoms include rhinorrhea. Pertinent negatives include no coughing.     Past Medical History:  Diagnosis Date  . Aortic atherosclerosis 04/16/2021  . Arthritis   . Cataract   . Diverticulosis   . H/O blood transfusion reaction 2010   hives  . H/O migraine    last 29yrs ago  . Headache(784.0)   . Heart murmur    takes Bystolic  nightly  . History of blood clots 5yrs   was on Lovenox  injections and Coumadin(was only on that for short period of time)  . History of shingles 3-62yrs ago  . Hx of seasonal allergies    takes Levocetirizine  prn  . Hyperlipidemia    takes Crestor  nightly  . Hypertension    takes Maxzide  daily  . Insomnia    takes Ambien  prn  . Joint pain   . Joint swelling   . Moderate tricuspid regurgitation   . Pre-diabetes   . Trigeminal neuralgia of right side of face 03/26/2016   V2 distribution     Family History  Problem Relation Age of Onset  . Heart attack Mother   . Alzheimer's disease Mother   . Diabetes Sister   . Hypertension Sister      Current Outpatient Medications:  .  acetaminophen  (TYLENOL ) 325 MG tablet, Take 650 mg by mouth every 6 (six) hours as needed for headache., Disp: , Rfl:  .  acetic acid  2 % otic solution, Place 2 drops into the right ear 3 (three) times daily., Disp: 15 mL, Rfl: 0 .  cetirizine  (ZYRTEC  ALLERGY) 10 MG tablet, Take 1 tablet (10 mg total) by mouth daily., Disp: 90 tablet, Rfl: 1 .  Cholecalciferol  (DIALYVITE VITAMIN D  5000) 125 MCG (5000 UT) capsule, Take 5,000 Units by mouth daily., Disp: , Rfl:  .  cyclobenzaprine  (FLEXERIL ) 10 MG tablet, Take 1 tablet (10 mg total) by mouth 3 (three) times daily as needed for muscle spasms., Disp: 30 tablet, Rfl: 0 .  lidocaine  (LIDODERM ) 5 %, Place 1 patch onto the skin daily. Remove & Discard patch within 12 hours or as directed by MD,  Disp: 30 patch, Rfl: 0 .  nebivolol  (BYSTOLIC ) 5 MG tablet, TAKE 1 TABLET DAILY, Disp: 90 tablet, Rfl: 3 .  ondansetron  (ZOFRAN -ODT) 4 MG disintegrating tablet, Take 1 tablet (4 mg total) by mouth every 8 (eight) hours as needed for nausea or vomiting., Disp: 20 tablet, Rfl: 0 .  pregabalin  (LYRICA ) 75 MG capsule, Take 1 capsule (75 mg total) by mouth daily., Disp: 90 capsule, Rfl: 1 .  rosuvastatin  (CRESTOR ) 10 MG tablet, TAKE 1 TABLET DAILY, Disp: 90 tablet, Rfl: 3 .  semaglutide -weight management (WEGOVY ) 1 MG/0.5ML SOAJ SQ injection, Inject 1 mg into the skin once a week., Disp: 6 mL, Rfl: 1 .  SYSTANE 0.4-0.3 % GEL ophthalmic gel, , Disp: , Rfl:  .  traMADol  (ULTRAM ) 50 MG tablet,  Take 50 mg by mouth as needed for moderate pain (pain score 4-6)., Disp: , Rfl:  .  triamcinolone  ointment (KENALOG ) 0.1 %, , Disp: , Rfl:  .  triamterene -hydrochlorothiazide  (MAXZIDE -25) 37.5-25 MG tablet, TAKE 1 TABLET DAILY, Disp: 90 tablet, Rfl: 3 .  zolpidem  (AMBIEN ) 5 MG tablet, Take 1 tablet (5 mg total) by mouth at bedtime as needed. for sleep, Disp: 60 tablet, Rfl: 1   Allergies  Allergen Reactions  . Aspirin  Anaphylaxis and Other (See Comments)  . Ibuprofen Hives, Swelling and Other (See Comments)  . Quinapril Hcl Anaphylaxis  . Penicillin G Other (See Comments)  . Quinapril Other (See Comments)  . Penicillins Hives, Swelling and Other (See Comments)    Has patient had a PCN reaction causing immediate rash, facial/tongue/throat swelling, SOB or lightheadedness with hypotension: Yes  Has patient had a PCN reaction causing severe rash involving mucus membranes or skin necrosis: No  Has patient had a PCN reaction that required hospitalization: No  Has patient had a PCN reaction occurring within the last 10 years: No  If all of the above answers are NO, then may proceed with Cephalosporin use.  Tolerated Cephalosporin Date: 12/13/20.  . Pravastatin Rash     Review of Systems  Constitutional: Negative.   HENT:  Positive for rhinorrhea.        Feels like she is talking in a tunnel  Eyes:  Negative for blurred vision.  Respiratory: Negative.  Negative for cough.   Cardiovascular: Negative.   Neurological: Negative.   Psychiatric/Behavioral: Negative.       Today's Vitals   07/22/24 1517  BP: 110/68  Pulse: (!) 59  Temp: 98.1 F (36.7 C)  SpO2: 98%  Weight: 171 lb 12.8 oz (77.9 kg)  Height: 5' (1.524 m)   Body mass index is 33.55 kg/m.  Wt Readings from Last 3 Encounters:  07/22/24 171 lb 12.8 oz (77.9 kg)  07/08/24 170 lb 9.6 oz (77.4 kg)  07/03/24 169 lb 6.4 oz (76.8 kg)     Objective:  Physical Exam Vitals and nursing note reviewed.  Constitutional:       Appearance: Normal appearance.  HENT:     Head: Normocephalic and atraumatic.     Right Ear: Ear canal and external ear normal. There is impacted cerumen.     Left Ear: Ear canal and external ear normal. There is impacted cerumen.  Eyes:     Extraocular Movements: Extraocular movements intact.  Cardiovascular:     Rate and Rhythm: Normal rate and regular rhythm.     Heart sounds: Normal heart sounds.  Pulmonary:     Effort: Pulmonary effort is normal.     Breath sounds: Normal breath sounds.  Skin:    General: Skin is warm.  Neurological:     General: No focal deficit present.     Mental Status: She is alert.  Psychiatric:        Mood and Affect: Mood normal.        Behavior: Behavior normal.         Assessment And Plan:  Bilateral impacted cerumen  Allergic rhinitis with postnasal drip  Class 1 obesity due to excess calories with serious comorbidity and body mass index (BMI) of 33.0 to 33.9 in adult   Assessment & Plan   Return move Oct 29 appt to week before Thanksgiving.  Patient was given opportunity to ask questions. Patient verbalized understanding of the plan and was able to repeat key elements of the plan. All questions were answered to their satisfaction.   I, Veronica LOISE Slocumb, MD, have reviewed all documentation for this visit. The documentation on 07/22/24 for the exam, diagnosis, procedures, and orders are all accurate and complete.   IF YOU HAVE BEEN REFERRED TO A SPECIALIST, IT MAY TAKE 1-2 WEEKS TO SCHEDULE/PROCESS THE REFERRAL. IF YOU HAVE NOT HEARD FROM US /SPECIALIST IN TWO WEEKS, PLEASE GIVE US  A CALL AT [phone].   THE PATIENT IS ENCOURAGED TO PRACTICE SOCIAL DISTANCING DUE TO THE COVID-19 PANDEMIC.

## 2024-07-22 NOTE — Patient Instructions (Signed)
 Hypertension, Adult Hypertension is another name for high blood pressure. High blood pressure forces your heart to work harder to pump blood. This can cause problems over time. There are two numbers in a blood pressure reading. There is a top number (systolic) over a bottom number (diastolic). It is best to have a blood pressure that is below 120/80. What are the causes? The cause of this condition is not known. Some other conditions can lead to high blood pressure. What increases the risk? Some lifestyle factors can make you more likely to develop high blood pressure: Smoking. Not getting enough exercise or physical activity. Being overweight. Having too much fat, sugar, calories, or salt (sodium) in your diet. Drinking too much alcohol. Other risk factors include: Having any of these conditions: Heart disease. Diabetes. High cholesterol. Kidney disease. Obstructive sleep apnea. Having a family history of high blood pressure and high cholesterol. Age. The risk increases with age. Stress. What are the signs or symptoms? High blood pressure may not cause symptoms. Very high blood pressure (hypertensive crisis) may cause: Headache. Fast or uneven heartbeats (palpitations). Shortness of breath. Nosebleed. Vomiting or feeling like you may vomit (nauseous). Changes in how you see. Very bad chest pain. Feeling dizzy. Seizures. How is this treated? This condition is treated by making healthy lifestyle changes, such as: Eating healthy foods. Exercising more. Drinking less alcohol. Your doctor may prescribe medicine if lifestyle changes do not help enough and if: Your top number is above 130. Your bottom number is above 80. Your personal target blood pressure may vary. Follow these instructions at home: Eating and drinking  If told, follow the DASH eating plan. To follow this plan: Fill one half of your plate at each meal with fruits and vegetables. Fill one fourth of your plate  at each meal with whole grains. Whole grains include whole-wheat pasta, brown rice, and whole-grain bread. Eat or drink low-fat dairy products, such as skim milk or low-fat yogurt. Fill one fourth of your plate at each meal with low-fat (lean) proteins. Low-fat proteins include fish, chicken without skin, eggs, beans, and tofu. Avoid fatty meat, cured and processed meat, or chicken with skin. Avoid pre-made or processed food. Limit the amount of salt in your diet to less than 1,500 mg each day. Do not drink alcohol if: Your doctor tells you not to drink. You are pregnant, may be pregnant, or are planning to become pregnant. If you drink alcohol: Limit how much you have to: 0-1 drink a day for women. 0-2 drinks a day for men. Know how much alcohol is in your drink. In the [place] one drink equals one 12 oz bottle of beer (355 mL), one 5 oz glass of wine (148 mL), or one 1 oz glass of hard liquor (44 mL). Lifestyle  Work with your doctor to stay at a healthy weight or to lose weight. Ask your doctor what the best weight is for you. Get at least 30 minutes of exercise that causes your heart to beat faster (aerobic exercise) most days of the week. This may include walking, swimming, or biking. Get at least 30 minutes of exercise that strengthens your muscles (resistance exercise) at least 3 days a week. This may include lifting weights or doing Pilates. Do not smoke or use any products that contain nicotine or tobacco. If you need help quitting, ask your doctor. Check your blood pressure at home as told by your doctor. Keep all follow-up visits. Medicines Take over-the-counter and prescription medicines  only as told by your doctor. Follow directions carefully. Do not skip doses of blood pressure medicine. The medicine does not work as well if you skip doses. Skipping doses also puts you at risk for problems. Ask your doctor about side effects or reactions to medicines that you should watch  for. Contact a doctor if: You think you are having a reaction to the medicine you are taking. You have headaches that keep coming back. You feel dizzy. You have swelling in your ankles. You have trouble with your vision. Get help right away if: You get a very bad headache. You start to feel mixed up (confused). You feel weak or numb. You feel faint. You have very bad pain in your: Chest. Belly (abdomen). You vomit more than once. You have trouble breathing. These symptoms may be an emergency. Get help right away. Call 911. Do not wait to see if the symptoms will go away. Do not drive yourself to the hospital. Summary Hypertension is another name for high blood pressure. High blood pressure forces your heart to work harder to pump blood. For most people, a normal blood pressure is less than 120/80. Making healthy choices can help lower blood pressure. If your blood pressure does not get lower with healthy choices, you may need to take medicine. This information is not intended to replace advice given to you by your health care provider. Make sure you discuss any questions you have with your health care provider. Document Revised: 07/27/2021 Document Reviewed: 07/27/2021 Elsevier Patient Education  2024 [employer].

## 2024-07-24 MED ORDER — NOREL AD 4-10-325 MG PO TABS: ORAL_TABLET | ORAL | 0 refills | Status: AC

## 2024-07-24 NOTE — Assessment & Plan Note (Addendum)
 Right ear pain with dry skin in the canal. Previous treatment with allergy medication and antibiotics improved symptoms. Norell provided significant relief but should not be used daily due to blood pressure concerns. - Flush ears to remove dry skin and debris. - Prescribed Neoral as needed for allergy symptoms, advised against daily use due to blood pressure impact. -  After obtaining verbal consent, both ears were flushed by irrigation. No TM abnormalities were noted. He tolerated procedure well without any complications.

## 2024-07-24 NOTE — Assessment & Plan Note (Signed)
 I will send rx Norel AD to use PRN.  She is aware this is not for daily use. Advised to continue with Zyrtec  daily. - Encouraged to follow low sodium diet.

## 2024-07-24 NOTE — Assessment & Plan Note (Signed)
 She is encouraged to strive for BMI less than 30 to decrease cardiac risk. Advised to aim for at least 150 minutes of exercise per week.

## 2024-08-19 ENCOUNTER — Ambulatory Visit: Payer: Self-pay | Admitting: Internal Medicine

## 2024-08-24 ENCOUNTER — Encounter: Payer: Self-pay | Admitting: Radiology

## 2024-08-31 ENCOUNTER — Ambulatory Visit: Admitting: Internal Medicine

## 2024-09-08 ENCOUNTER — Ambulatory Visit (INDEPENDENT_AMBULATORY_CARE_PROVIDER_SITE_OTHER): Payer: Self-pay | Admitting: Internal Medicine

## 2024-09-08 ENCOUNTER — Encounter: Payer: Self-pay | Admitting: Internal Medicine

## 2024-09-08 ENCOUNTER — Telehealth: Payer: Self-pay

## 2024-09-08 VITALS — BP 116/80 | HR 60 | Temp 98.1°F | Ht 60.0 in | Wt 166.0 lb

## 2024-09-08 DIAGNOSIS — I5032 Chronic diastolic (congestive) heart failure: Secondary | ICD-10-CM

## 2024-09-08 DIAGNOSIS — E6609 Other obesity due to excess calories: Secondary | ICD-10-CM

## 2024-09-08 DIAGNOSIS — I2510 Atherosclerotic heart disease of native coronary artery without angina pectoris: Secondary | ICD-10-CM | POA: Diagnosis not present

## 2024-09-08 DIAGNOSIS — E66811 Obesity, class 1: Secondary | ICD-10-CM | POA: Diagnosis not present

## 2024-09-08 DIAGNOSIS — R7303 Prediabetes: Secondary | ICD-10-CM

## 2024-09-08 DIAGNOSIS — I700 Atherosclerosis of aorta: Secondary | ICD-10-CM | POA: Diagnosis not present

## 2024-09-08 DIAGNOSIS — I110 Hypertensive heart disease with heart failure: Secondary | ICD-10-CM | POA: Diagnosis not present

## 2024-09-08 DIAGNOSIS — Z6832 Body mass index (BMI) 32.0-32.9, adult: Secondary | ICD-10-CM

## 2024-09-08 DIAGNOSIS — E7800 Pure hypercholesterolemia, unspecified: Secondary | ICD-10-CM

## 2024-09-08 MED ORDER — WEGOVY 1 MG/0.5ML ~~LOC~~ SOAJ: 1.0000 mg | SUBCUTANEOUS | 1 refills | Status: AC

## 2024-09-08 NOTE — Assessment & Plan Note (Addendum)
 Chronic, well controlled.  She will continue with nebivolol 5mg  daily and Maxzide 37.5/25mg  daily. Encouraged to follow low sodium diet.

## 2024-09-08 NOTE — Assessment & Plan Note (Addendum)
 Chronic, currently on Wegovy  0.5mg  weekly. Unfortunately, it appears that her insurance no longer covers the medication. She was given samples of 0.25mg  Wegovy  to take the rest of the year. Will see if this is covered at the start of 2026.  - She is reminded to optimize her protein intake and to engage in weight-bearing exercises at least three days per week.

## 2024-09-08 NOTE — Patient Instructions (Signed)
 Hypertension, Adult Hypertension is another name for high blood pressure. High blood pressure forces your heart to work harder to pump blood. This can cause problems over time. There are two numbers in a blood pressure reading. There is a top number (systolic) over a bottom number (diastolic). It is best to have a blood pressure that is below 120/80. What are the causes? The cause of this condition is not known. Some other conditions can lead to high blood pressure. What increases the risk? Some lifestyle factors can make you more likely to develop high blood pressure: Smoking. Not getting enough exercise or physical activity. Being overweight. Having too much fat, sugar, calories, or salt (sodium) in your diet. Drinking too much alcohol. Other risk factors include: Having any of these conditions: Heart disease. Diabetes. High cholesterol. Kidney disease. Obstructive sleep apnea. Having a family history of high blood pressure and high cholesterol. Age. The risk increases with age. Stress. What are the signs or symptoms? High blood pressure may not cause symptoms. Very high blood pressure (hypertensive crisis) may cause: Headache. Fast or uneven heartbeats (palpitations). Shortness of breath. Nosebleed. Vomiting or feeling like you may vomit (nauseous). Changes in how you see. Very bad chest pain. Feeling dizzy. Seizures. How is this treated? This condition is treated by making healthy lifestyle changes, such as: Eating healthy foods. Exercising more. Drinking less alcohol. Your doctor may prescribe medicine if lifestyle changes do not help enough and if: Your top number is above 130. Your bottom number is above 80. Your personal target blood pressure may vary. Follow these instructions at home: Eating and drinking  If told, follow the DASH eating plan. To follow this plan: Fill one half of your plate at each meal with fruits and vegetables. Fill one fourth of your plate  at each meal with whole grains. Whole grains include whole-wheat pasta, brown rice, and whole-grain bread. Eat or drink low-fat dairy products, such as skim milk or low-fat yogurt. Fill one fourth of your plate at each meal with low-fat (lean) proteins. Low-fat proteins include fish, chicken without skin, eggs, beans, and tofu. Avoid fatty meat, cured and processed meat, or chicken with skin. Avoid pre-made or processed food. Limit the amount of salt in your diet to less than 1,500 mg each day. Do not drink alcohol if: Your doctor tells you not to drink. You are pregnant, may be pregnant, or are planning to become pregnant. If you drink alcohol: Limit how much you have to: 0-1 drink a day for women. 0-2 drinks a day for men. Know how much alcohol is in your drink. In the [place] one drink equals one 12 oz bottle of beer (355 mL), one 5 oz glass of wine (148 mL), or one 1 oz glass of hard liquor (44 mL). Lifestyle  Work with your doctor to stay at a healthy weight or to lose weight. Ask your doctor what the best weight is for you. Get at least 30 minutes of exercise that causes your heart to beat faster (aerobic exercise) most days of the week. This may include walking, swimming, or biking. Get at least 30 minutes of exercise that strengthens your muscles (resistance exercise) at least 3 days a week. This may include lifting weights or doing Pilates. Do not smoke or use any products that contain nicotine or tobacco. If you need help quitting, ask your doctor. Check your blood pressure at home as told by your doctor. Keep all follow-up visits. Medicines Take over-the-counter and prescription medicines  only as told by your doctor. Follow directions carefully. Do not skip doses of blood pressure medicine. The medicine does not work as well if you skip doses. Skipping doses also puts you at risk for problems. Ask your doctor about side effects or reactions to medicines that you should watch  for. Contact a doctor if: You think you are having a reaction to the medicine you are taking. You have headaches that keep coming back. You feel dizzy. You have swelling in your ankles. You have trouble with your vision. Get help right away if: You get a very bad headache. You start to feel mixed up (confused). You feel weak or numb. You feel faint. You have very bad pain in your: Chest. Belly (abdomen). You vomit more than once. You have trouble breathing. These symptoms may be an emergency. Get help right away. Call 911. Do not wait to see if the symptoms will go away. Do not drive yourself to the hospital. Summary Hypertension is another name for high blood pressure. High blood pressure forces your heart to work harder to pump blood. For most people, a normal blood pressure is less than 120/80. Making healthy choices can help lower blood pressure. If your blood pressure does not get lower with healthy choices, you may need to take medicine. This information is not intended to replace advice given to you by your health care provider. Make sure you discuss any questions you have with your health care provider. Document Revised: 07/27/2021 Document Reviewed: 07/27/2021 Elsevier Patient Education  2024 [employer].

## 2024-09-08 NOTE — Assessment & Plan Note (Addendum)
 Chronic, LDL goal is less than 70.  She will continue with rosuvastatin  10mg  daily.  - Follow heart healthy lifestyle.

## 2024-09-08 NOTE — Telephone Encounter (Signed)
 Rx Benefits verified YL,RMA

## 2024-09-08 NOTE — Assessment & Plan Note (Addendum)
 She is encouraged to strive for BMI less than 30 to decrease cardiac risk. Advised to aim for at least 150 minutes of exercise per week.

## 2024-09-08 NOTE — Progress Notes (Signed)
 I,[name], CMA,acting as a neurosurgeon for Veronica LOISE Slocumb, MD.,have documented all relevant documentation on the behalf of Veronica LOISE Slocumb, MD,as directed by  Veronica LOISE Slocumb, MD while in the presence of Veronica LOISE Slocumb, MD.  Subjective:  Patient ID: [name] , female    DOB: July 31, 1951 , 73 y.o.   MRN: [id number]  Chief Complaint  Patient presents with   Hypertension    Patient presents today for a bp and chol check. Patient reports compliance with her meds. Patient denies having chest pain,sob or headaches at this time. Ptient doesn't have any questions or concerns at this time.   Hyperlipidemia    HPI Discussed the use of AI scribe software for clinical note transcription with the patient, who gave verbal consent to proceed.  History of Present Illness [name] is a 73 year old female who presents for a regular blood pressure check and weight management.  She feels tired due to missing her afternoon nap and waking up early to let her dog out, which disrupted her usual sleep routine.  She is currently taking Bystolic , Lyrica , rosuvastatin  10 mg, Wegovy  1 mg, Maxide, and zolpidem . There is an issue with her Wegovy  prescription, as it is no longer covered by her insurance, [employer], since July. She received a 0.5 mg dose after her 1 mg dose ran out, which she took after finishing the 1 mg dose. She has two weeks left of the 0.5 mg dose, injecting on Mondays. She mentions that the Wegovy  was prescribed for her heart condition, not for weight loss, and expresses frustration with the insurance company for not covering it. No issues tolerating the 1 mg dose of Wegovy .  She consumed a lot of candy due to skipping breakfast, which may affect her A1c levels.   Hypertension This is a chronic problem. The current episode started more than 1 year ago. The problem has been gradually improving since onset. The problem is controlled. Pertinent negatives include no blurred vision. Risk  factors for coronary artery disease include dyslipidemia, post-menopausal state, sedentary lifestyle and stress. Past treatments include beta blockers and diuretics. The current treatment provides moderate improvement.     Past Medical History:  Diagnosis Date   Aortic atherosclerosis 04/16/2021   Arthritis    Cataract    Diverticulosis    H/O blood transfusion reaction 2010   hives   H/O migraine    last 34yrs ago   Headache(784.0)    Heart murmur    takes Bystolic  nightly   History of blood clots 4yrs   was on Lovenox  injections and Coumadin(was only on that for short period of time)   History of shingles 3-11yrs ago   Hx of seasonal allergies    takes Levocetirizine prn   Hyperlipidemia    takes Crestor  nightly   Hypertension    takes Maxzide  daily   Insomnia    takes Ambien  prn   Joint pain    Joint swelling    Moderate tricuspid regurgitation    Pre-diabetes    Trigeminal neuralgia of right side of face 03/26/2016   V2 distribution     Family History  Problem Relation Age of Onset   Heart attack Mother    Alzheimer's disease Mother    Diabetes Sister    Hypertension Sister      Current Outpatient Medications:    acetaminophen  (TYLENOL ) 325 MG tablet, Take 650 mg by mouth every 6 (six) hours as needed for headache., Disp: ,  Rfl:    acetic acid  2 % otic solution, Place 2 drops into the right ear 3 (three) times daily., Disp: 15 mL, Rfl: 0   cetirizine  (ZYRTEC  ALLERGY) 10 MG tablet, Take 1 tablet (10 mg total) by mouth daily., Disp: 90 tablet, Rfl: 1   Chlorphen-PE-Acetaminophen  (NOREL AD) 4-10-325 MG TABS, One tab po twice daily as needed, Disp: 20 tablet, Rfl: 0   Cholecalciferol  (DIALYVITE VITAMIN D  5000) 125 MCG (5000 UT) capsule, Take 5,000 Units by mouth daily., Disp: , Rfl:    cyclobenzaprine  (FLEXERIL ) 10 MG tablet, Take 1 tablet (10 mg total) by mouth 3 (three) times daily as needed for muscle spasms., Disp: 30 tablet, Rfl: 0   lidocaine  (LIDODERM ) 5 %, Place  1 patch onto the skin daily. Remove & Discard patch within 12 hours or as directed by MD, Disp: 30 patch, Rfl: 0   nebivolol  (BYSTOLIC ) 5 MG tablet, TAKE 1 TABLET DAILY, Disp: 90 tablet, Rfl: 3   ondansetron  (ZOFRAN -ODT) 4 MG disintegrating tablet, Take 1 tablet (4 mg total) by mouth every 8 (eight) hours as needed for nausea or vomiting., Disp: 20 tablet, Rfl: 0   pregabalin  (LYRICA ) 75 MG capsule, Take 1 capsule (75 mg total) by mouth daily., Disp: 90 capsule, Rfl: 1   rosuvastatin  (CRESTOR ) 10 MG tablet, TAKE 1 TABLET DAILY, Disp: 90 tablet, Rfl: 3   SYSTANE 0.4-0.3 % GEL ophthalmic gel, , Disp: , Rfl:    traMADol  (ULTRAM ) 50 MG tablet, Take 50 mg by mouth as needed for moderate pain (pain score 4-6)., Disp: , Rfl:    triamcinolone  ointment (KENALOG ) 0.1 %, , Disp: , Rfl:    triamterene -hydrochlorothiazide  (MAXZIDE -25) 37.5-25 MG tablet, TAKE 1 TABLET DAILY, Disp: 90 tablet, Rfl: 3   zolpidem  (AMBIEN ) 5 MG tablet, Take 1 tablet (5 mg total) by mouth at bedtime as needed. for sleep, Disp: 60 tablet, Rfl: 1   semaglutide -weight management (WEGOVY ) 1 MG/0.5ML SOAJ SQ injection, Inject 1 mg into the skin once a week., Disp: 6 mL, Rfl: 1   Allergies  Allergen Reactions   Aspirin  Anaphylaxis and Other (See Comments)   Ibuprofen Hives, Swelling and Other (See Comments)   Quinapril Hcl Anaphylaxis   Penicillin G Other (See Comments)   Quinapril Other (See Comments)   Penicillins Hives, Swelling and Other (See Comments)    Has patient had a PCN reaction causing immediate rash, facial/tongue/throat swelling, SOB or lightheadedness with hypotension: Yes  Has patient had a PCN reaction causing severe rash involving mucus membranes or skin necrosis: No  Has patient had a PCN reaction that required hospitalization: No  Has patient had a PCN reaction occurring within the last 10 years: No  If all of the above answers are NO, then may proceed with Cephalosporin use.  Tolerated Cephalosporin Date:  12/13/20.   Pravastatin Rash     Review of Systems  Constitutional: Negative.   Eyes: Negative.  Negative for blurred vision.  Respiratory: Negative.    Cardiovascular: Negative.   Gastrointestinal: Negative.   Musculoskeletal: Negative.   Skin: Negative.   Psychiatric/Behavioral: Negative.       Today's Vitals   09/08/24 1528  BP: 116/80  Pulse: 60  Temp: 98.1 F (36.7 C)  TempSrc: Oral  Weight: 166 lb (75.3 kg)  Height: 5' (1.524 m)  PainSc: 6   PainLoc: Knee   Body mass index is 32.42 kg/m.  Wt Readings from Last 3 Encounters:  09/08/24 166 lb (75.3 kg)  07/22/24 171 lb 12.8  oz (77.9 kg)  07/08/24 170 lb 9.6 oz (77.4 kg)    The ASCVD Risk score (Arnett DK, et al., 2019) failed to calculate for the following reasons:   The valid total cholesterol range is 130 to 320 mg/dL  Objective:  Physical Exam Vitals and nursing note reviewed.  Constitutional:      Appearance: Normal appearance.  HENT:     Head: Normocephalic and atraumatic.  Eyes:     Extraocular Movements: Extraocular movements intact.  Cardiovascular:     Rate and Rhythm: Normal rate and regular rhythm.     Heart sounds: Normal heart sounds.  Pulmonary:     Effort: Pulmonary effort is normal.     Breath sounds: Normal breath sounds.  Musculoskeletal:     Cervical back: Normal range of motion.  Skin:    General: Skin is warm.  Neurological:     General: No focal deficit present.     Mental Status: She is alert.  Psychiatric:        Mood and Affect: Mood normal.        Behavior: Behavior normal.         Assessment And Plan:   Assessment & Plan Hypertensive heart disease with chronic diastolic congestive heart failure (HCC) Chronic, well controlled.  She will continue with nebivolol  5mg  daily and Maxzide  37.5/25mg  daily. Encouraged to follow low sodium diet.    Coronary artery disease involving native coronary artery of native heart without angina pectoris Chronic, currently on Wegovy   0.5mg  weekly. Unfortunately, it appears that her insurance no longer covers the medication. She was given samples of 0.25mg  Wegovy  to take the rest of the year. Will see if this is covered at the start of 2026.  - She is reminded to optimize her protein intake and to engage in weight-bearing exercises at least three days per week.  Aortic atherosclerosis Chronic, LDL goal is less than 70.  She will continue with rosuvastatin  10mg  daily.  - Follow heart healthy lifestyle.  Pure hypercholesterolemia Chronic, LDL goal is less than 70. Optimal LDL is less than 55. Importance of statin compliance is stressed to the patient.  Prediabetes Previous labs reviewed, her A1c has been elevated in the past. I will check an A1c today. Reminded to avoid refined sugars including sugary drinks/foods and processed meats including bacon, sausages and deli meats.   Class 1 obesity due to excess calories with serious comorbidity and body mass index (BMI) of 32.0 to 32.9 in adult She is encouraged to strive for BMI less than 30 to decrease cardiac risk. Advised to aim for at least 150 minutes of exercise per week.   Assessment & Plan Adult Wellness Visit Routine six-month wellness visit with no acute concerns. - Ordered liver function tests. - Ordered kidney function tests. - Ordered cholesterol panel. - Ordered A1c test.  Atherosclerotic heart disease of native coronary artery Managed with Wegovy  for weight management to reduce cardiovascular risk. Insurance coverage issues with Wegovy  due to lack of coverage for weight loss. Plan to use coronary artery disease diagnosis for insurance approval. Discussed potential transition to Ozempic pen if Wegovy  is not approved. - Attempted prior authorization for Wegovy  using coronary artery disease diagnosis. - If Wegovy  is not approved, will consider transitioning to Ozempic pen.  Hypertensive heart disease with heart failure  Obesity, class 1 Insurance coverage  issues noted. Plan to continue Wegovy  if approved, otherwise transition to Ozempic pen. - Attempted prior authorization for Wegovy . - If Wegovy  is not  approved, will consider transitioning to Ozempic pen.   Orders Placed This Encounter  Procedures   CMP14+EGFR   Lipid panel   Hemoglobin A1c     Return in about 4 months (around 01/06/2025) for BPC.  Patient was given opportunity to ask questions. Patient verbalized understanding of the plan and was able to repeat key elements of the plan. All questions were answered to their satisfaction.    I, Veronica LOISE Slocumb, MD, have reviewed all documentation for this visit. The documentation on 09/13/24 for the exam, diagnosis, procedures, and orders are all accurate and complete.   IF YOU HAVE BEEN REFERRED TO A SPECIALIST, IT MAY TAKE 1-2 WEEKS TO SCHEDULE/PROCESS THE REFERRAL. IF YOU HAVE NOT HEARD FROM US /SPECIALIST IN TWO WEEKS, PLEASE GIVE US  A CALL AT [phone].

## 2024-09-09 ENCOUNTER — Ambulatory Visit: Payer: Self-pay | Admitting: Internal Medicine

## 2024-09-09 LAB — CMP14+EGFR
ALT: 13 IU/L (ref 0–32)
AST: 15 IU/L (ref 0–40)
Albumin: 4.4 g/dL (ref 3.8–4.8)
Alkaline Phosphatase: 71 IU/L (ref 49–135)
BUN/Creatinine Ratio: 20 (ref 12–28)
BUN: 14 mg/dL (ref 8–27)
Bilirubin Total: 0.4 mg/dL (ref 0.0–1.2)
CO2: 26 mmol/L (ref 20–29)
Calcium: 9.9 mg/dL (ref 8.7–10.3)
Chloride: 100 mmol/L (ref 96–106)
Creatinine, Ser: 0.7 mg/dL (ref 0.57–1.00)
Globulin, Total: 2.7 g/dL (ref 1.5–4.5)
Glucose: 80 mg/dL (ref 70–99)
Potassium: 4.1 mmol/L (ref 3.5–5.2)
Sodium: 140 mmol/L (ref 134–144)
Total Protein: 7.1 g/dL (ref 6.0–8.5)
eGFR: 91 mL/min/1.73 (ref >59)

## 2024-09-09 LAB — LIPID PANEL
Chol/HDL Ratio: 2 ratio (ref 0.0–4.4)
Cholesterol, Total: 124 mg/dL (ref 100–199)
HDL: 61 mg/dL (ref >39)
LDL Chol Calc (NIH): 50 mg/dL (ref 0–99)
Triglycerides: 58 mg/dL (ref 0–149)
VLDL Cholesterol Cal: 13 mg/dL (ref 5–40)

## 2024-09-09 LAB — HEMOGLOBIN A1C
Est. average glucose Bld gHb Est-mCnc: 126 mg/dL
Hgb A1c MFr Bld: 6 % — ABNORMAL HIGH (ref 4.8–5.6)

## 2024-09-13 NOTE — Assessment & Plan Note (Signed)
 Chronic, LDL goal is less than 70. Optimal LDL is less than 55. Importance of statin compliance is stressed to the patient.

## 2024-09-13 NOTE — Assessment & Plan Note (Signed)
 Previous labs reviewed, her A1c has been elevated in the past. I will check an A1c today. Reminded to avoid refined sugars including sugary drinks/foods and processed meats including bacon, sausages and deli meats.

## 2024-10-19 ENCOUNTER — Other Ambulatory Visit: Payer: Self-pay | Admitting: Internal Medicine

## 2024-10-26 ENCOUNTER — Encounter: Payer: Self-pay | Admitting: Internal Medicine

## 2024-10-26 ENCOUNTER — Ambulatory Visit: Admitting: Internal Medicine

## 2024-10-26 VITALS — BP 110/70 | HR 68 | Temp 98.3°F | Ht 60.0 in | Wt 168.4 lb

## 2024-10-26 DIAGNOSIS — M79609 Pain in unspecified limb: Secondary | ICD-10-CM

## 2024-10-26 DIAGNOSIS — R202 Paresthesia of skin: Secondary | ICD-10-CM | POA: Diagnosis not present

## 2024-10-26 DIAGNOSIS — I5032 Chronic diastolic (congestive) heart failure: Secondary | ICD-10-CM

## 2024-10-26 DIAGNOSIS — I110 Hypertensive heart disease with heart failure: Secondary | ICD-10-CM | POA: Diagnosis not present

## 2024-10-26 DIAGNOSIS — M7989 Other specified soft tissue disorders: Secondary | ICD-10-CM | POA: Diagnosis not present

## 2024-10-26 NOTE — Progress Notes (Signed)
 I,[name], CMA,acting as a neurosurgeon for Veronica LOISE Slocumb, MD.,have documented all relevant documentation on the behalf of Veronica LOISE Slocumb, MD,as directed by  Veronica LOISE Slocumb, MD while in the presence of Veronica LOISE Slocumb, MD.  Subjective:  Patient ID: [name] , female    DOB: 08/29/1951 , 74 y.o.   MRN: [id number]  Chief Complaint  Patient presents with   Mass    Patient presents today for lump on left leg behind her knee. She also states not being able to move her left side sometime last week. She went completely limp.  She denies reaching out to orthopedic doctor. She admits the mass has gone down some.     HPI Discussed the use of AI scribe software for clinical note transcription with the patient, who gave verbal consent to proceed.  History of Present Illness [name] is a 74 year old female who presents with a lump on her left leg and lower extremity tingling. .  A lump was noted on her left leg, specifically behind the knee, around the first week of December. Initially larger, the lump has since decreased in size and is not associated with pain.  Around the time the lump was larger, she experienced a sudden inability to move her left side, describing it as feeling 'kind of paralyzed.' This episode occurred right after Christmas while she was in bed. She experienced a burning sensation starting in her foot and traveling up her leg to her thigh. No back pain was noted during this episode.  Her past medical history includes a previous blood clot in her lung. She denies any swelling in her leg with the current issue and has not experienced symptoms similar to her past clotting event.  She remains active, frequently going up and down a flight of stairs in her home and engaging in regular activities such as shopping. No long car rides have been taken recently.   Past Medical History:  Diagnosis Date   Aortic atherosclerosis 04/16/2021   Arthritis    Cataract     Diverticulosis    H/O blood transfusion reaction 2010   hives   H/O migraine    last 32yrs ago   Headache(784.0)    Heart murmur    takes Bystolic  nightly   History of blood clots 12yrs   was on Lovenox  injections and Coumadin(was only on that for short period of time)   History of shingles 3-34yrs ago   Hx of seasonal allergies    takes Levocetirizine prn   Hyperlipidemia    takes Crestor  nightly   Hypertension    takes Maxzide  daily   Insomnia    takes Ambien  prn   Joint pain    Joint swelling    Moderate tricuspid regurgitation    Pre-diabetes    Trigeminal neuralgia of right side of face 03/26/2016   V2 distribution     Family History  Problem Relation Age of Onset   Heart attack Mother    Alzheimer's disease Mother    Diabetes Sister    Hypertension Sister      Current Outpatient Medications:    acetaminophen  (TYLENOL ) 325 MG tablet, Take 650 mg by mouth every 6 (six) hours as needed for headache., Disp: , Rfl:    acetic acid  2 % otic solution, Place 2 drops into the right ear 3 (three) times daily., Disp: 15 mL, Rfl: 0   cetirizine  (ZYRTEC  ALLERGY) 10 MG tablet, Take 1 tablet (10  mg total) by mouth daily., Disp: 90 tablet, Rfl: 1   Chlorphen-PE-Acetaminophen  (NOREL AD) 4-10-325 MG TABS, One tab po twice daily as needed, Disp: 20 tablet, Rfl: 0   Cholecalciferol  (DIALYVITE VITAMIN D  5000) 125 MCG (5000 UT) capsule, Take 5,000 Units by mouth daily., Disp: , Rfl:    cyclobenzaprine  (FLEXERIL ) 10 MG tablet, Take 1 tablet (10 mg total) by mouth 3 (three) times daily as needed for muscle spasms., Disp: 30 tablet, Rfl: 0   lidocaine  (LIDODERM ) 5 %, Place 1 patch onto the skin daily. Remove & Discard patch within 12 hours or as directed by MD, Disp: 30 patch, Rfl: 0   nebivolol  (BYSTOLIC ) 5 MG tablet, TAKE 1 TABLET DAILY, Disp: 90 tablet, Rfl: 3   ondansetron  (ZOFRAN -ODT) 4 MG disintegrating tablet, Take 1 tablet (4 mg total) by mouth every 8 (eight) hours as needed for nausea  or vomiting., Disp: 20 tablet, Rfl: 0   pregabalin  (LYRICA ) 75 MG capsule, Take 1 capsule (75 mg total) by mouth daily., Disp: 90 capsule, Rfl: 1   rosuvastatin  (CRESTOR ) 10 MG tablet, TAKE 1 TABLET DAILY, Disp: 90 tablet, Rfl: 3   semaglutide -weight management (WEGOVY ) 1 MG/0.5ML SOAJ SQ injection, Inject 1 mg into the skin once a week., Disp: 6 mL, Rfl: 1   SYSTANE 0.4-0.3 % GEL ophthalmic gel, , Disp: , Rfl:    traMADol  (ULTRAM ) 50 MG tablet, Take 50 mg by mouth as needed for moderate pain (pain score 4-6)., Disp: , Rfl:    triamcinolone  ointment (KENALOG ) 0.1 %, , Disp: , Rfl:    triamterene -hydrochlorothiazide  (MAXZIDE -25) 37.5-25 MG tablet, TAKE 1 TABLET DAILY, Disp: 90 tablet, Rfl: 3   zolpidem  (AMBIEN ) 5 MG tablet, TAKE 1 TABLET(5 MG) BY MOUTH AT BEDTIME AS NEEDED FOR SLEEP, Disp: 60 tablet, Rfl: 1   Allergies  Allergen Reactions   Aspirin  Anaphylaxis and Other (See Comments)   Ibuprofen Hives, Swelling and Other (See Comments)   Quinapril Hcl Anaphylaxis   Penicillin G Other (See Comments)   Quinapril Other (See Comments)   Penicillins Hives, Swelling and Other (See Comments)    Has patient had a PCN reaction causing immediate rash, facial/tongue/throat swelling, SOB or lightheadedness with hypotension: Yes  Has patient had a PCN reaction causing severe rash involving mucus membranes or skin necrosis: No  Has patient had a PCN reaction that required hospitalization: No  Has patient had a PCN reaction occurring within the last 10 years: No  If all of the above answers are NO, then may proceed with Cephalosporin use.  Tolerated Cephalosporin Date: 12/13/20.   Pravastatin Rash     Review of Systems  Constitutional: Negative.   Respiratory: Negative.    Cardiovascular: Negative.   Gastrointestinal: Negative.   Neurological: Negative.   Psychiatric/Behavioral: Negative.       Today's Vitals   10/26/24 1535  BP: 110/70  Pulse: 68  Temp: 98.3 F (36.8 C)  SpO2: 98%   Weight: 168 lb 6.4 oz (76.4 kg)  Height: 5' (1.524 m)   Body mass index is 32.89 kg/m.  Wt Readings from Last 3 Encounters:  10/26/24 168 lb 6.4 oz (76.4 kg)  09/08/24 166 lb (75.3 kg)  07/22/24 171 lb 12.8 oz (77.9 kg)    The ASCVD Risk score (Arnett DK, et al., 2019) failed to calculate for the following reasons:   The valid total cholesterol range is 130 to 320 mg/dL  Objective:  Physical Exam Vitals and nursing note reviewed.  Constitutional:  Appearance: Normal appearance.  HENT:     Head: Normocephalic and atraumatic.  Eyes:     Extraocular Movements: Extraocular movements intact.  Cardiovascular:     Rate and Rhythm: Normal rate and regular rhythm.     Heart sounds: Normal heart sounds.  Pulmonary:     Effort: Pulmonary effort is normal.     Breath sounds: Normal breath sounds.  Musculoskeletal:     Cervical back: Normal range of motion.  Skin:    General: Skin is warm.  Neurological:     General: No focal deficit present.     Mental Status: She is alert.  Psychiatric:        Mood and Affect: Mood normal.        Behavior: Behavior normal.         Assessment And Plan:   Assessment & Plan Soft tissue mass Soft tissue mass above posterior left knee, reduced in size. Differential includes lipoma or Baker's cyst. No pain or related symptoms. - Ordered ultrasound of the left thigh. - Advised surgical removal if mass becomes bothersome. Paresthesia and pain of left extremity Intermittent tingling and burning in left foot to thigh, unrelated to soft tissue mass. - Monitor symptoms and report if she worsens. Hypertensive heart disease with chronic diastolic congestive heart failure (HCC)  Chronic diastolic (congestive) heart failure (HCC)     Orders Placed This Encounter  Procedures   US  LT LOWER EXTREM LTD SOFT TISSUE NON VASCULAR   Vitamin B12     Return if symptoms worsen or fail to improve.  Patient was given opportunity to ask questions.  Patient verbalized understanding of the plan and was able to repeat key elements of the plan. All questions were answered to their satisfaction.    I, Veronica LOISE Slocumb, MD, have reviewed all documentation for this visit. The documentation on 10/26/2024 for the exam, diagnosis, procedures, and orders are all accurate and complete.   IF YOU HAVE BEEN REFERRED TO A SPECIALIST, IT MAY TAKE 1-2 WEEKS TO SCHEDULE/PROCESS THE REFERRAL. IF YOU HAVE NOT HEARD FROM US /SPECIALIST IN TWO WEEKS, PLEASE GIVE US  A CALL AT [phone].

## 2024-10-26 NOTE — Patient Instructions (Signed)
 Lipoma  A lipoma is a noncancerous (benign) tumor that is made up of fat cells. This is a very common type of soft-tissue growth. Lipomas are usually found under the skin (subcutaneous). They may occur in any tissue of the body that contains fat. Common areas for lipomas to appear include the back, arms, shoulders, buttocks, and thighs. Lipomas grow slowly, and they are usually painless. Most lipomas do not cause problems and do not require treatment. What are the causes? The cause of this condition is not known. What increases the risk? You are more likely to develop this condition if: You are 18-42 years old. You have a family history of lipomas. What are the signs or symptoms? A lipoma usually appears as a small, round bump under the skin. In most cases, the lump will: Feel soft or rubbery. Not cause pain or other symptoms. However, if a lipoma is located in an area where it pushes on nerves, it can become painful or cause other symptoms. How is this diagnosed? A lipoma can usually be diagnosed with a physical exam. You may also have tests to confirm the diagnosis and to rule out other conditions. Tests may include: Imaging tests, such as a CT scan or an MRI. Removal of a tissue sample to be looked at under a microscope (biopsy). How is this treated? Treatment for this condition depends on the size of the lipoma and whether it is causing any symptoms. For small lipomas that are not causing problems, no treatment is needed. If a lipoma is bigger or it causes problems, surgery may be done to remove the lipoma. Lipomas can also be removed to improve appearance. Most often, the procedure is done after applying a medicine that numbs the area (local anesthetic). Liposuction may be done to reduce the size of the lipoma before it is removed through surgery, or it may be done to remove the lipoma. Lipomas are removed with this method to limit incision size and scarring. A liposuction tube is  inserted through a small incision into the lipoma, and the contents of the lipoma are removed through the tube with suction. Follow these instructions at home: Watch your lipoma for any changes. Keep all follow-up visits. This is important. Where to find more information OrthoInfo: orthoinfo.aaos.org Contact a health care provider if: Your lipoma becomes larger or hard. Your lipoma becomes painful, red, or increasingly swollen. These could be signs of infection or a more serious condition. Get help right away if: You develop tingling or numbness in an area near the lipoma. This could indicate that the lipoma is causing nerve damage. Summary A lipoma is a noncancerous tumor that is made up of fat cells. Most lipomas do not cause problems and do not require treatment. If a lipoma is bigger or it causes problems, surgery may be done to remove the lipoma. Contact a health care provider if your lipoma becomes larger or hard, or if it becomes painful, red, or increasingly swollen. These could be signs of infection or a more serious condition. This information is not intended to replace advice given to you by your health care provider. Make sure you discuss any questions you have with your health care provider. Document Revised: 10/27/2021 Document Reviewed: 10/27/2021 Elsevier Patient Education  2024 [employer].

## 2024-10-27 ENCOUNTER — Ambulatory Visit: Payer: Self-pay | Admitting: Internal Medicine

## 2024-10-27 LAB — VITAMIN B12: Vitamin B-12: 474 pg/mL (ref 232–1245)

## 2024-10-29 ENCOUNTER — Ambulatory Visit
Admission: RE | Admit: 2024-10-29 | Discharge: 2024-10-29 | Disposition: A | Discharge status: Home / Self Care | Source: Ambulatory Visit | Attending: Internal Medicine | Admitting: Internal Medicine

## 2024-10-29 DIAGNOSIS — M7989 Other specified soft tissue disorders: Secondary | ICD-10-CM

## 2024-11-01 NOTE — Assessment & Plan Note (Signed)
 Soft tissue mass above posterior left knee, reduced in size. Differential includes lipoma or Baker's cyst. No pain or related symptoms. - Ordered ultrasound of the left thigh. - Advised surgical removal if mass becomes bothersome.

## 2024-11-01 NOTE — Assessment & Plan Note (Signed)
 Intermittent tingling and burning in left foot to thigh, unrelated to soft tissue mass. - Monitor symptoms and report if she worsens.

## 2024-11-04 ENCOUNTER — Encounter: Payer: Self-pay | Admitting: Internal Medicine

## 2024-11-23 ENCOUNTER — Encounter: Payer: Self-pay | Admitting: Internal Medicine

## 2025-01-06 ENCOUNTER — Ambulatory Visit: Admitting: Internal Medicine

## 2025-04-07 ENCOUNTER — Ambulatory Visit: Payer: Self-pay
